# Patient Record
Sex: Female | Born: 1935 | Race: Black or African American | Hispanic: No | Smoking: Former smoker
Health system: Southern US, Community
[De-identification: ages and names within clinical notes are randomized; demographics above are authoritative.]

## PROBLEM LIST (undated history)

## (undated) ENCOUNTER — Emergency Department (HOSPITAL_COMMUNITY): Disposition: A | Discharge status: Home / Self Care | Payer: Medicare Other

## (undated) DIAGNOSIS — M549 Dorsalgia, unspecified: Secondary | ICD-10-CM

## (undated) DIAGNOSIS — N186 End stage renal disease: Secondary | ICD-10-CM

## (undated) DIAGNOSIS — I272 Pulmonary hypertension, unspecified: Secondary | ICD-10-CM

## (undated) DIAGNOSIS — E785 Hyperlipidemia, unspecified: Secondary | ICD-10-CM

## (undated) DIAGNOSIS — K219 Gastro-esophageal reflux disease without esophagitis: Secondary | ICD-10-CM

## (undated) DIAGNOSIS — S7010XA Contusion of unspecified thigh, initial encounter: Secondary | ICD-10-CM

## (undated) DIAGNOSIS — J45909 Unspecified asthma, uncomplicated: Secondary | ICD-10-CM

## (undated) DIAGNOSIS — M109 Gout, unspecified: Secondary | ICD-10-CM

## (undated) DIAGNOSIS — D649 Anemia, unspecified: Secondary | ICD-10-CM

## (undated) DIAGNOSIS — J189 Pneumonia, unspecified organism: Secondary | ICD-10-CM

## (undated) DIAGNOSIS — Z5189 Encounter for other specified aftercare: Secondary | ICD-10-CM

## (undated) DIAGNOSIS — I10 Essential (primary) hypertension: Secondary | ICD-10-CM

## (undated) DIAGNOSIS — Z992 Dependence on renal dialysis: Secondary | ICD-10-CM

## (undated) DIAGNOSIS — M1990 Unspecified osteoarthritis, unspecified site: Secondary | ICD-10-CM

## (undated) DIAGNOSIS — S4291XA Fracture of right shoulder girdle, part unspecified, initial encounter for closed fracture: Secondary | ICD-10-CM

## (undated) DIAGNOSIS — E119 Type 2 diabetes mellitus without complications: Secondary | ICD-10-CM

## (undated) DIAGNOSIS — I96 Gangrene, not elsewhere classified: Secondary | ICD-10-CM

## (undated) DIAGNOSIS — G8929 Other chronic pain: Secondary | ICD-10-CM

## (undated) DIAGNOSIS — I739 Peripheral vascular disease, unspecified: Secondary | ICD-10-CM

## (undated) HISTORY — PX: AV FISTULA REPAIR: SHX563

## (undated) HISTORY — DX: Gangrene, not elsewhere classified: I96

## (undated) HISTORY — PX: CATARACT EXTRACTION, BILATERAL: SHX1313

## (undated) HISTORY — DX: Anemia, unspecified: D64.9

## (undated) HISTORY — DX: Unspecified osteoarthritis, unspecified site: M19.90

## (undated) HISTORY — PX: EYE SURGERY: SHX253

## (undated) HISTORY — PX: ARTERIOVENOUS GRAFT PLACEMENT: SUR1029

## (undated) HISTORY — PX: COLONOSCOPY: SHX174

## (undated) HISTORY — DX: Peripheral vascular disease, unspecified: I73.9

## (undated) HISTORY — DX: Hyperlipidemia, unspecified: E78.5

## (undated) HISTORY — PX: AV FISTULA PLACEMENT: SHX1204

## (undated) HISTORY — DX: Gout, unspecified: M10.9

## (undated) HISTORY — DX: Pulmonary hypertension, unspecified: I27.20

---

## 1959-09-01 DIAGNOSIS — Z5189 Encounter for other specified aftercare: Secondary | ICD-10-CM

## 1959-09-01 DIAGNOSIS — IMO0001 Reserved for inherently not codable concepts without codable children: Secondary | ICD-10-CM

## 1959-09-01 HISTORY — PX: TOTAL ABDOMINAL HYSTERECTOMY: SHX209

## 1959-09-01 HISTORY — PX: APPENDECTOMY: SHX54

## 1959-09-01 HISTORY — DX: Reserved for inherently not codable concepts without codable children: IMO0001

## 1959-09-01 HISTORY — DX: Encounter for other specified aftercare: Z51.89

## 1979-09-01 DIAGNOSIS — M109 Gout, unspecified: Secondary | ICD-10-CM

## 1979-09-01 HISTORY — DX: Gout, unspecified: M10.9

## 2002-01-16 ENCOUNTER — Ambulatory Visit (HOSPITAL_COMMUNITY): Admission: RE | Admit: 2002-01-16 | Discharge: 2002-01-16 | Payer: Self-pay | Admitting: Pulmonary Disease

## 2002-01-16 ENCOUNTER — Encounter: Payer: Self-pay | Admitting: Pulmonary Disease

## 2002-10-14 ENCOUNTER — Encounter: Payer: Self-pay | Admitting: Pulmonary Disease

## 2002-10-14 ENCOUNTER — Ambulatory Visit (HOSPITAL_COMMUNITY): Admission: RE | Admit: 2002-10-14 | Discharge: 2002-10-14 | Payer: Self-pay | Admitting: Pulmonary Disease

## 2002-11-05 ENCOUNTER — Encounter: Payer: Self-pay | Admitting: Emergency Medicine

## 2002-11-05 ENCOUNTER — Emergency Department (HOSPITAL_COMMUNITY): Admission: EM | Admit: 2002-11-05 | Discharge: 2002-11-05 | Payer: Self-pay | Admitting: Emergency Medicine

## 2005-01-03 ENCOUNTER — Ambulatory Visit (HOSPITAL_COMMUNITY): Admission: RE | Admit: 2005-01-03 | Discharge: 2005-01-03 | Payer: Self-pay | Admitting: Pulmonary Disease

## 2005-01-04 ENCOUNTER — Inpatient Hospital Stay (HOSPITAL_COMMUNITY): Admission: EM | Admit: 2005-01-04 | Discharge: 2005-01-11 | Payer: Self-pay | Admitting: *Deleted

## 2005-12-06 ENCOUNTER — Ambulatory Visit (HOSPITAL_COMMUNITY): Admission: RE | Admit: 2005-12-06 | Discharge: 2005-12-06 | Payer: Self-pay | Admitting: Pulmonary Disease

## 2006-02-28 ENCOUNTER — Ambulatory Visit (HOSPITAL_COMMUNITY): Admission: RE | Admit: 2006-02-28 | Discharge: 2006-02-28 | Payer: Self-pay | Admitting: Pulmonary Disease

## 2006-03-20 ENCOUNTER — Inpatient Hospital Stay (HOSPITAL_COMMUNITY): Admission: AD | Admit: 2006-03-20 | Discharge: 2006-03-27 | Payer: Self-pay | Admitting: Internal Medicine

## 2006-03-22 ENCOUNTER — Encounter: Payer: Self-pay | Admitting: Vascular Surgery

## 2006-05-29 ENCOUNTER — Ambulatory Visit: Payer: Self-pay | Admitting: Oncology

## 2006-06-03 ENCOUNTER — Encounter (HOSPITAL_COMMUNITY): Admission: RE | Admit: 2006-06-03 | Discharge: 2006-09-01 | Payer: Self-pay | Admitting: Nephrology

## 2006-06-12 LAB — CBC WITH DIFFERENTIAL/PLATELET
BASO%: 0.3 % (ref 0.0–2.0)
Basophils Absolute: 0 10*3/uL (ref 0.0–0.1)
HCT: 34.2 % — ABNORMAL LOW (ref 34.8–46.6)
HGB: 11.2 g/dL — ABNORMAL LOW (ref 11.6–15.9)
MONO#: 0.6 10*3/uL (ref 0.1–0.9)
NEUT#: 5.6 10*3/uL (ref 1.5–6.5)
NEUT%: 71.2 % (ref 39.6–76.8)
WBC: 7.9 10*3/uL (ref 3.9–10.0)
lymph#: 1.4 10*3/uL (ref 0.9–3.3)

## 2006-06-14 ENCOUNTER — Ambulatory Visit (HOSPITAL_COMMUNITY): Admission: RE | Admit: 2006-06-14 | Discharge: 2006-06-14 | Payer: Self-pay | Admitting: Oncology

## 2006-06-15 LAB — COMPREHENSIVE METABOLIC PANEL
ALT: 14 U/L (ref 0–40)
AST: 14 U/L (ref 0–37)
Albumin: 4.2 g/dL (ref 3.5–5.2)
Alkaline Phosphatase: 85 U/L (ref 39–117)
BUN: 75 mg/dL — ABNORMAL HIGH (ref 6–23)
CO2: 24 mEq/L (ref 19–32)
Calcium: 11.6 mg/dL — ABNORMAL HIGH (ref 8.4–10.5)
Chloride: 98 mEq/L (ref 96–112)
Creatinine, Ser: 8.09 mg/dL — ABNORMAL HIGH (ref 0.40–1.20)
Glucose, Bld: 150 mg/dL — ABNORMAL HIGH (ref 70–99)
Potassium: 4.5 mEq/L (ref 3.5–5.3)
Sodium: 137 mEq/L (ref 135–145)
Total Bilirubin: 0.3 mg/dL (ref 0.3–1.2)
Total Protein: 7.7 g/dL (ref 6.0–8.3)

## 2006-06-15 LAB — VISCOSITY, SERUM: Viscosity, Serum: 1.3 mPa.S (ref 1.1–2.0)

## 2006-06-15 LAB — SPEP & IFE WITH QIG
Albumin ELP: 51.6 % — ABNORMAL LOW (ref 55.8–66.1)
Alpha-1-Globulin: 5.9 % — ABNORMAL HIGH (ref 2.9–4.9)
Alpha-2-Globulin: 11.1 % (ref 7.1–11.8)
Beta 2: 5.2 % (ref 3.2–6.5)
Beta Globulin: 4.5 % — ABNORMAL LOW (ref 4.7–7.2)
Gamma Globulin: 21.7 % — ABNORMAL HIGH (ref 11.1–18.8)
IgA: 235 mg/dL (ref 68–378)
IgG (Immunoglobin G), Serum: 1850 mg/dL — ABNORMAL HIGH (ref 694–1618)
IgM, Serum: 95 mg/dL (ref 60–263)
M-Spike, %: 0.73 g/dL
Total Protein, Serum Electrophoresis: 7.7 g/dL (ref 6.0–8.3)

## 2006-06-15 LAB — BETA 2 MICROGLOBULIN, SERUM: Beta-2 Microglobulin: 13.97 mg/L — ABNORMAL HIGH (ref 1.01–1.73)

## 2006-06-15 LAB — KAPPA/LAMBDA LIGHT CHAINS
Kappa free light chain: 24.2 mg/dL — ABNORMAL HIGH (ref 0.33–1.94)
Kappa:Lambda Ratio: 1.95 — ABNORMAL HIGH (ref 0.26–1.65)
Lambda Free Lght Chn: 12.4 mg/dL — ABNORMAL HIGH (ref 0.57–2.63)

## 2006-06-15 LAB — LACTATE DEHYDROGENASE: LDH: 146 U/L (ref 94–250)

## 2006-06-17 LAB — UIFE/LIGHT CHAINS/TP QN, 24-HR UR
Free Kappa/Lambda Ratio: 5.14 ratio — ABNORMAL HIGH (ref 0.46–4.00)
Free Lambda Lt Chains,Ur: 5.6 mg/dL — ABNORMAL HIGH (ref 0.08–1.01)
Free Lt Chn Excr Rate: 230.4 mg/d
Total Protein, Urine-Ur/day: 973 mg/d — ABNORMAL HIGH (ref 10–140)
Total Protein, Urine: 121.6 mg/dL
Volume, Urine: 800 mL

## 2006-07-10 ENCOUNTER — Ambulatory Visit: Payer: Self-pay | Admitting: Oncology

## 2006-07-29 ENCOUNTER — Ambulatory Visit: Payer: Self-pay | Admitting: Oncology

## 2006-07-29 ENCOUNTER — Ambulatory Visit (HOSPITAL_COMMUNITY): Admission: RE | Admit: 2006-07-29 | Discharge: 2006-07-29 | Payer: Self-pay | Admitting: Oncology

## 2006-07-29 ENCOUNTER — Encounter (INDEPENDENT_AMBULATORY_CARE_PROVIDER_SITE_OTHER): Payer: Self-pay | Admitting: Specialist

## 2006-09-11 ENCOUNTER — Encounter (HOSPITAL_COMMUNITY): Admission: RE | Admit: 2006-09-11 | Discharge: 2006-12-10 | Payer: Self-pay | Admitting: Nephrology

## 2006-11-04 ENCOUNTER — Ambulatory Visit: Payer: Self-pay | Admitting: Oncology

## 2006-11-06 LAB — CBC WITH DIFFERENTIAL/PLATELET
BASO%: 0.2 % (ref 0.0–2.0)
Basophils Absolute: 0 10*3/uL (ref 0.0–0.1)
Eosinophils Absolute: 0.2 10*3/uL (ref 0.0–0.5)
HCT: 37.7 % (ref 34.8–46.6)
HGB: 12.2 g/dL (ref 11.6–15.9)
LYMPH%: 24.2 % (ref 14.0–48.0)
MCHC: 32.4 g/dL (ref 32.0–36.0)
MONO#: 0.5 10*3/uL (ref 0.1–0.9)
NEUT%: 63.6 % (ref 39.6–76.8)
Platelets: 375 10*3/uL (ref 145–400)
WBC: 6.2 10*3/uL (ref 3.9–10.0)

## 2006-11-11 LAB — KAPPA/LAMBDA LIGHT CHAINS: Kappa:Lambda Ratio: 1.83 — ABNORMAL HIGH (ref 0.26–1.65)

## 2006-11-11 LAB — COMPREHENSIVE METABOLIC PANEL
AST: 21 U/L (ref 0–37)
Albumin: 3.8 g/dL (ref 3.5–5.2)
Alkaline Phosphatase: 63 U/L (ref 39–117)
Glucose, Bld: 106 mg/dL — ABNORMAL HIGH (ref 70–99)
Potassium: 5.9 mEq/L — ABNORMAL HIGH (ref 3.5–5.3)
Sodium: 138 mEq/L (ref 135–145)
Total Protein: 7.1 g/dL (ref 6.0–8.3)

## 2006-11-11 LAB — SPEP & IFE WITH QIG
Albumin ELP: 51.8 % — ABNORMAL LOW (ref 55.8–66.1)
Alpha-1-Globulin: 6 % — ABNORMAL HIGH (ref 2.9–4.9)
Alpha-2-Globulin: 10.8 % (ref 7.1–11.8)
Beta 2: 4.4 % (ref 3.2–6.5)
Beta Globulin: 5.2 % (ref 4.7–7.2)
IgA: 188 mg/dL (ref 68–378)

## 2007-01-22 ENCOUNTER — Encounter (HOSPITAL_COMMUNITY): Admission: RE | Admit: 2007-01-22 | Discharge: 2007-04-22 | Payer: Self-pay | Admitting: Pediatric Dentistry

## 2007-04-30 ENCOUNTER — Encounter (HOSPITAL_COMMUNITY): Admission: RE | Admit: 2007-04-30 | Discharge: 2007-07-07 | Payer: Self-pay | Admitting: Nephrology

## 2007-05-23 ENCOUNTER — Ambulatory Visit: Payer: Self-pay | Admitting: Oncology

## 2007-05-30 ENCOUNTER — Ambulatory Visit (HOSPITAL_COMMUNITY): Admission: RE | Admit: 2007-05-30 | Discharge: 2007-05-30 | Payer: Self-pay | Admitting: Vascular Surgery

## 2007-05-30 ENCOUNTER — Ambulatory Visit: Payer: Self-pay | Admitting: Vascular Surgery

## 2007-07-11 ENCOUNTER — Emergency Department (HOSPITAL_COMMUNITY): Admission: EM | Admit: 2007-07-11 | Discharge: 2007-07-11 | Payer: Self-pay | Admitting: Emergency Medicine

## 2007-07-22 ENCOUNTER — Ambulatory Visit (HOSPITAL_COMMUNITY): Admission: RE | Admit: 2007-07-22 | Discharge: 2007-07-22 | Payer: Self-pay | Admitting: Family Medicine

## 2007-08-19 ENCOUNTER — Ambulatory Visit (HOSPITAL_COMMUNITY): Admission: RE | Admit: 2007-08-19 | Discharge: 2007-08-19 | Payer: Self-pay | Admitting: Vascular Surgery

## 2007-08-19 ENCOUNTER — Ambulatory Visit: Payer: Self-pay | Admitting: Vascular Surgery

## 2007-12-17 ENCOUNTER — Ambulatory Visit: Payer: Self-pay

## 2007-12-29 ENCOUNTER — Ambulatory Visit (HOSPITAL_COMMUNITY): Admission: RE | Admit: 2007-12-29 | Discharge: 2007-12-29 | Payer: Self-pay | Admitting: Nephrology

## 2007-12-31 ENCOUNTER — Ambulatory Visit: Payer: Self-pay | Admitting: Surgery

## 2007-12-31 ENCOUNTER — Ambulatory Visit (HOSPITAL_COMMUNITY): Admission: RE | Admit: 2007-12-31 | Discharge: 2007-12-31 | Payer: Self-pay | Admitting: Surgery

## 2008-01-27 ENCOUNTER — Ambulatory Visit: Payer: Self-pay | Admitting: Vascular Surgery

## 2008-02-12 ENCOUNTER — Ambulatory Visit (HOSPITAL_COMMUNITY): Admission: RE | Admit: 2008-02-12 | Discharge: 2008-02-12 | Payer: Self-pay | Admitting: Vascular Surgery

## 2008-02-12 ENCOUNTER — Ambulatory Visit: Payer: Self-pay | Admitting: Vascular Surgery

## 2008-03-16 ENCOUNTER — Ambulatory Visit (HOSPITAL_COMMUNITY): Admission: RE | Admit: 2008-03-16 | Discharge: 2008-03-16 | Payer: Self-pay | Admitting: Vascular Surgery

## 2008-03-16 ENCOUNTER — Ambulatory Visit: Payer: Self-pay | Admitting: Vascular Surgery

## 2008-03-30 ENCOUNTER — Ambulatory Visit: Payer: Self-pay | Admitting: Vascular Surgery

## 2008-04-15 ENCOUNTER — Ambulatory Visit (HOSPITAL_COMMUNITY): Admission: RE | Admit: 2008-04-15 | Discharge: 2008-04-15 | Payer: Self-pay | Admitting: Vascular Surgery

## 2008-04-15 ENCOUNTER — Ambulatory Visit: Payer: Self-pay | Admitting: Vascular Surgery

## 2008-04-22 ENCOUNTER — Encounter: Admission: RE | Admit: 2008-04-22 | Discharge: 2008-04-22 | Payer: Self-pay | Admitting: Nephrology

## 2008-04-27 ENCOUNTER — Ambulatory Visit: Payer: Self-pay | Admitting: Vascular Surgery

## 2008-05-31 ENCOUNTER — Ambulatory Visit (HOSPITAL_COMMUNITY): Admission: RE | Admit: 2008-05-31 | Discharge: 2008-05-31 | Payer: Self-pay | Admitting: Vascular Surgery

## 2008-05-31 ENCOUNTER — Emergency Department (HOSPITAL_COMMUNITY): Admission: EM | Admit: 2008-05-31 | Discharge: 2008-05-31 | Payer: Self-pay | Admitting: Emergency Medicine

## 2008-05-31 ENCOUNTER — Ambulatory Visit: Payer: Self-pay | Admitting: *Deleted

## 2008-06-06 ENCOUNTER — Inpatient Hospital Stay (HOSPITAL_COMMUNITY): Admission: EM | Admit: 2008-06-06 | Discharge: 2008-06-06 | Payer: Self-pay | Admitting: Emergency Medicine

## 2008-06-06 ENCOUNTER — Encounter (INDEPENDENT_AMBULATORY_CARE_PROVIDER_SITE_OTHER): Payer: Self-pay | Admitting: Emergency Medicine

## 2008-06-07 ENCOUNTER — Ambulatory Visit: Payer: Self-pay | Admitting: Surgery

## 2008-06-17 ENCOUNTER — Ambulatory Visit (HOSPITAL_COMMUNITY): Admission: RE | Admit: 2008-06-17 | Discharge: 2008-06-17 | Payer: Self-pay | Admitting: Surgery

## 2008-06-27 ENCOUNTER — Inpatient Hospital Stay (HOSPITAL_COMMUNITY): Admission: EM | Admit: 2008-06-27 | Discharge: 2008-06-29 | Payer: Self-pay | Admitting: Emergency Medicine

## 2008-06-28 ENCOUNTER — Encounter: Payer: Self-pay | Admitting: Internal Medicine

## 2008-07-22 ENCOUNTER — Encounter: Admission: RE | Admit: 2008-07-22 | Discharge: 2008-07-22 | Payer: Self-pay | Admitting: Family Medicine

## 2008-07-29 ENCOUNTER — Encounter: Admission: RE | Admit: 2008-07-29 | Discharge: 2008-07-29 | Payer: Self-pay | Admitting: Family Medicine

## 2008-09-30 ENCOUNTER — Encounter: Admission: RE | Admit: 2008-09-30 | Discharge: 2008-09-30 | Payer: Self-pay | Admitting: Nephrology

## 2008-10-19 ENCOUNTER — Ambulatory Visit (HOSPITAL_COMMUNITY): Admission: RE | Admit: 2008-10-19 | Discharge: 2008-10-19 | Payer: Self-pay | Admitting: Nephrology

## 2008-10-21 ENCOUNTER — Encounter: Admission: RE | Admit: 2008-10-21 | Discharge: 2008-10-21 | Payer: Self-pay | Admitting: Nephrology

## 2008-11-30 ENCOUNTER — Ambulatory Visit (HOSPITAL_COMMUNITY): Admission: RE | Admit: 2008-11-30 | Discharge: 2008-11-30 | Payer: Self-pay | Admitting: Nephrology

## 2008-12-02 ENCOUNTER — Ambulatory Visit: Payer: Self-pay | Admitting: *Deleted

## 2008-12-13 ENCOUNTER — Ambulatory Visit (HOSPITAL_COMMUNITY): Admission: RE | Admit: 2008-12-13 | Discharge: 2008-12-13 | Payer: Self-pay | Admitting: *Deleted

## 2008-12-13 ENCOUNTER — Ambulatory Visit: Payer: Self-pay | Admitting: *Deleted

## 2009-01-06 ENCOUNTER — Ambulatory Visit: Payer: Self-pay | Admitting: *Deleted

## 2009-01-13 ENCOUNTER — Ambulatory Visit: Payer: Self-pay | Admitting: Vascular Surgery

## 2009-01-14 ENCOUNTER — Inpatient Hospital Stay (HOSPITAL_COMMUNITY): Admission: RE | Admit: 2009-01-14 | Discharge: 2009-01-15 | Payer: Self-pay | Admitting: Vascular Surgery

## 2009-02-18 ENCOUNTER — Ambulatory Visit (HOSPITAL_COMMUNITY): Admission: RE | Admit: 2009-02-18 | Discharge: 2009-02-18 | Payer: Self-pay | Admitting: Vascular Surgery

## 2009-02-18 ENCOUNTER — Ambulatory Visit: Payer: Self-pay | Admitting: Vascular Surgery

## 2009-03-15 ENCOUNTER — Ambulatory Visit (HOSPITAL_COMMUNITY): Admission: RE | Admit: 2009-03-15 | Discharge: 2009-03-15 | Payer: Self-pay | Admitting: Surgery

## 2009-03-17 ENCOUNTER — Ambulatory Visit (HOSPITAL_COMMUNITY): Admission: RE | Admit: 2009-03-17 | Discharge: 2009-03-17 | Payer: Self-pay | Admitting: Surgery

## 2009-03-18 ENCOUNTER — Ambulatory Visit (HOSPITAL_COMMUNITY): Admission: RE | Admit: 2009-03-18 | Discharge: 2009-03-18 | Payer: Self-pay | Admitting: Vascular Surgery

## 2009-03-18 ENCOUNTER — Ambulatory Visit: Payer: Self-pay | Admitting: Vascular Surgery

## 2009-03-21 ENCOUNTER — Emergency Department (HOSPITAL_COMMUNITY): Admission: EM | Admit: 2009-03-21 | Discharge: 2009-03-21 | Payer: Self-pay | Admitting: Emergency Medicine

## 2009-03-22 ENCOUNTER — Ambulatory Visit: Payer: Self-pay | Admitting: Vascular Surgery

## 2009-04-05 ENCOUNTER — Ambulatory Visit (HOSPITAL_COMMUNITY): Admission: RE | Admit: 2009-04-05 | Discharge: 2009-04-06 | Payer: Self-pay | Admitting: Vascular Surgery

## 2009-05-12 ENCOUNTER — Ambulatory Visit: Payer: Self-pay | Admitting: Vascular Surgery

## 2009-05-12 ENCOUNTER — Ambulatory Visit (HOSPITAL_COMMUNITY): Admission: RE | Admit: 2009-05-12 | Discharge: 2009-05-12 | Payer: Self-pay | Admitting: Vascular Surgery

## 2009-07-18 ENCOUNTER — Emergency Department (HOSPITAL_COMMUNITY): Admission: EM | Admit: 2009-07-18 | Discharge: 2009-07-18 | Payer: Self-pay | Admitting: Emergency Medicine

## 2009-11-23 ENCOUNTER — Encounter: Admission: RE | Admit: 2009-11-23 | Discharge: 2009-11-23 | Payer: Self-pay | Admitting: Nephrology

## 2010-01-09 ENCOUNTER — Encounter: Admission: RE | Admit: 2010-01-09 | Discharge: 2010-01-09 | Payer: Self-pay | Admitting: Family Medicine

## 2010-02-13 DIAGNOSIS — I498 Other specified cardiac arrhythmias: Secondary | ICD-10-CM

## 2010-02-13 DIAGNOSIS — I10 Essential (primary) hypertension: Secondary | ICD-10-CM

## 2010-02-13 DIAGNOSIS — N186 End stage renal disease: Secondary | ICD-10-CM

## 2010-02-13 DIAGNOSIS — M109 Gout, unspecified: Secondary | ICD-10-CM

## 2010-02-13 DIAGNOSIS — E1122 Type 2 diabetes mellitus with diabetic chronic kidney disease: Secondary | ICD-10-CM

## 2010-02-13 DIAGNOSIS — J4489 Other specified chronic obstructive pulmonary disease: Secondary | ICD-10-CM | POA: Insufficient documentation

## 2010-02-13 DIAGNOSIS — E875 Hyperkalemia: Secondary | ICD-10-CM | POA: Insufficient documentation

## 2010-02-13 DIAGNOSIS — J449 Chronic obstructive pulmonary disease, unspecified: Secondary | ICD-10-CM | POA: Insufficient documentation

## 2010-02-13 DIAGNOSIS — R262 Difficulty in walking, not elsewhere classified: Secondary | ICD-10-CM | POA: Insufficient documentation

## 2010-02-14 ENCOUNTER — Ambulatory Visit: Payer: Self-pay | Admitting: Cardiovascular Disease

## 2010-02-14 DIAGNOSIS — R072 Precordial pain: Secondary | ICD-10-CM

## 2010-02-14 DIAGNOSIS — R002 Palpitations: Secondary | ICD-10-CM | POA: Insufficient documentation

## 2010-02-22 ENCOUNTER — Telehealth (INDEPENDENT_AMBULATORY_CARE_PROVIDER_SITE_OTHER): Payer: Self-pay | Admitting: *Deleted

## 2010-03-07 ENCOUNTER — Ambulatory Visit: Payer: Self-pay

## 2010-03-07 ENCOUNTER — Encounter: Payer: Self-pay | Admitting: Cardiovascular Disease

## 2010-03-07 ENCOUNTER — Ambulatory Visit (HOSPITAL_COMMUNITY): Admission: RE | Admit: 2010-03-07 | Discharge: 2010-03-07 | Payer: Self-pay | Admitting: Cardiovascular Disease

## 2010-03-07 ENCOUNTER — Ambulatory Visit: Payer: Self-pay | Admitting: Cardiovascular Disease

## 2010-03-16 ENCOUNTER — Encounter: Admission: RE | Admit: 2010-03-16 | Discharge: 2010-03-16 | Payer: Self-pay | Admitting: Nephrology

## 2010-03-28 ENCOUNTER — Ambulatory Visit: Payer: Self-pay | Admitting: Cardiovascular Disease

## 2010-03-28 DIAGNOSIS — I059 Rheumatic mitral valve disease, unspecified: Secondary | ICD-10-CM | POA: Insufficient documentation

## 2010-06-18 ENCOUNTER — Emergency Department (HOSPITAL_COMMUNITY): Admission: EM | Admit: 2010-06-18 | Discharge: 2010-06-18 | Payer: Self-pay | Admitting: Family Medicine

## 2010-06-19 ENCOUNTER — Inpatient Hospital Stay (HOSPITAL_COMMUNITY): Admission: EM | Admit: 2010-06-19 | Discharge: 2010-06-21 | Payer: Self-pay | Admitting: Emergency Medicine

## 2010-07-18 ENCOUNTER — Ambulatory Visit: Payer: Self-pay | Admitting: Vascular Surgery

## 2010-07-20 ENCOUNTER — Ambulatory Visit (HOSPITAL_COMMUNITY): Admission: RE | Admit: 2010-07-20 | Discharge: 2010-07-20 | Payer: Self-pay | Admitting: Nephrology

## 2010-08-03 ENCOUNTER — Inpatient Hospital Stay (HOSPITAL_COMMUNITY): Admission: RE | Admit: 2010-08-03 | Discharge: 2010-08-04 | Payer: Self-pay | Admitting: Surgery

## 2010-08-03 ENCOUNTER — Ambulatory Visit: Payer: Self-pay | Admitting: Surgery

## 2010-08-22 ENCOUNTER — Ambulatory Visit: Payer: Self-pay | Admitting: Vascular Surgery

## 2010-12-04 ENCOUNTER — Ambulatory Visit (HOSPITAL_COMMUNITY)
Admission: RE | Admit: 2010-12-04 | Discharge: 2010-12-04 | Payer: Self-pay | Source: Home / Self Care | Admitting: Nephrology

## 2010-12-19 ENCOUNTER — Ambulatory Visit: Payer: Self-pay | Admitting: Surgery

## 2010-12-31 DIAGNOSIS — S4291XA Fracture of right shoulder girdle, part unspecified, initial encounter for closed fracture: Secondary | ICD-10-CM

## 2010-12-31 HISTORY — DX: Fracture of right shoulder girdle, part unspecified, initial encounter for closed fracture: S42.91XA

## 2011-01-21 ENCOUNTER — Encounter: Payer: Self-pay | Admitting: Nephrology
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
## 2011-02-01 NOTE — Progress Notes (Signed)
  Phone Note From Other Clinic   Caller: [facility] Details for Reason: pt.Information Initial call taken by: KM    Faxed all Cardiac over to [facility] [place]  February 22, 2010 3:44 PM
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
## 2011-03-07 ENCOUNTER — Other Ambulatory Visit (HOSPITAL_COMMUNITY): Payer: Self-pay | Admitting: Podiatry

## 2011-03-07 DIAGNOSIS — M79672 Pain in left foot: Secondary | ICD-10-CM

## 2011-03-16 ENCOUNTER — Encounter (HOSPITAL_COMMUNITY): Payer: Self-pay

## 2011-03-16 ENCOUNTER — Other Ambulatory Visit (HOSPITAL_COMMUNITY): Payer: Self-pay

## 2011-03-16 LAB — POCT I-STAT, CHEM 8
BUN: 24 mg/dL — ABNORMAL HIGH (ref 6–23)
Calcium, Ion: 0.99 mmol/L — ABNORMAL LOW (ref 1.12–1.32)
Chloride: 101 mEq/L (ref 96–112)
Glucose, Bld: 125 mg/dL — ABNORMAL HIGH (ref 70–99)
HCT: 38 % (ref 36.0–46.0)

## 2011-03-16 LAB — CBC
HCT: 24.2 % — ABNORMAL LOW (ref 36.0–46.0)
Hemoglobin: 7.8 g/dL — ABNORMAL LOW (ref 12.0–15.0)
MCH: 32.8 pg (ref 26.0–34.0)
MCHC: 32.2 g/dL (ref 30.0–36.0)

## 2011-03-16 LAB — RENAL FUNCTION PANEL
CO2: 29 mEq/L (ref 19–32)
Calcium: 7.9 mg/dL — ABNORMAL LOW (ref 8.4–10.5)
Creatinine, Ser: 9.39 mg/dL — ABNORMAL HIGH (ref 0.4–1.2)
Glucose, Bld: 131 mg/dL — ABNORMAL HIGH (ref 70–99)

## 2011-03-16 LAB — PROTIME-INR: Prothrombin Time: 13.4 seconds (ref 11.6–15.2)

## 2011-03-18 LAB — RENAL FUNCTION PANEL
Albumin: 2.9 g/dL — ABNORMAL LOW (ref 3.5–5.2)
BUN: 22 mg/dL (ref 6–23)
Creatinine, Ser: 9.82 mg/dL — ABNORMAL HIGH (ref 0.4–1.2)
Glucose, Bld: 123 mg/dL — ABNORMAL HIGH (ref 70–99)
Phosphorus: 2 mg/dL — ABNORMAL LOW (ref 2.3–4.6)

## 2011-03-18 LAB — COMPREHENSIVE METABOLIC PANEL
ALT: 11 U/L (ref 0–35)
AST: 19 U/L (ref 0–37)
Albumin: 3.2 g/dL — ABNORMAL LOW (ref 3.5–5.2)
Alkaline Phosphatase: 58 U/L (ref 39–117)
GFR calc Af Amer: 6 mL/min — ABNORMAL LOW (ref >60)
Potassium: 3.5 mEq/L (ref 3.5–5.1)
Sodium: 135 mEq/L (ref 135–145)
Total Protein: 7.2 g/dL (ref 6.0–8.3)

## 2011-03-18 LAB — GLUCOSE, CAPILLARY
Glucose-Capillary: 103 mg/dL — ABNORMAL HIGH (ref 70–99)
Glucose-Capillary: 158 mg/dL — ABNORMAL HIGH (ref 70–99)
Glucose-Capillary: 185 mg/dL — ABNORMAL HIGH (ref 70–99)
Glucose-Capillary: 96 mg/dL (ref 70–99)
Glucose-Capillary: 97 mg/dL (ref 70–99)
Glucose-Capillary: 98 mg/dL (ref 70–99)

## 2011-03-18 LAB — IRON AND TIBC
Iron: 47 ug/dL (ref 42–135)
TIBC: 154 ug/dL — ABNORMAL LOW (ref 250–470)
UIBC: 107 ug/dL

## 2011-03-18 LAB — BASIC METABOLIC PANEL
BUN: 28 mg/dL — ABNORMAL HIGH (ref 6–23)
BUN: 28 mg/dL — ABNORMAL HIGH (ref 6–23)
CO2: 30 mEq/L (ref 19–32)
CO2: 31 mEq/L (ref 19–32)
Calcium: 8.5 mg/dL (ref 8.4–10.5)
Chloride: 93 mEq/L — ABNORMAL LOW (ref 96–112)
Chloride: 94 mEq/L — ABNORMAL LOW (ref 96–112)
Chloride: 96 mEq/L (ref 96–112)
Creatinine, Ser: 10.41 mg/dL — ABNORMAL HIGH (ref 0.4–1.2)
Creatinine, Ser: 10.43 mg/dL — ABNORMAL HIGH (ref 0.4–1.2)
Creatinine, Ser: 11.31 mg/dL — ABNORMAL HIGH (ref 0.4–1.2)
GFR calc Af Amer: 4 mL/min — ABNORMAL LOW (ref >60)
GFR calc non Af Amer: 3 mL/min — ABNORMAL LOW (ref >60)

## 2011-03-18 LAB — CBC
HCT: 28.1 % — ABNORMAL LOW (ref 36.0–46.0)
Hemoglobin: 11.4 g/dL — ABNORMAL LOW (ref 12.0–15.0)
MCHC: 33.6 g/dL (ref 30.0–36.0)
MCHC: 33.8 g/dL (ref 30.0–36.0)
MCV: 97.6 fL (ref 78.0–100.0)
MCV: 98.2 fL (ref 78.0–100.0)
Platelets: 198 10*3/uL (ref 150–400)
Platelets: 217 10*3/uL (ref 150–400)
Platelets: 228 10*3/uL (ref 150–400)
RBC: 2.97 MIL/uL — ABNORMAL LOW (ref 3.87–5.11)
RDW: 13.9 % (ref 11.5–15.5)
RDW: 14 % (ref 11.5–15.5)

## 2011-03-18 LAB — CARDIAC PANEL(CRET KIN+CKTOT+MB+TROPI)
Total CK: 410 U/L — ABNORMAL HIGH (ref 7–177)
Troponin I: 0.04 ng/mL (ref 0.00–0.06)

## 2011-03-18 LAB — HEMOGLOBIN A1C
Hgb A1c MFr Bld: 7.4 % — ABNORMAL HIGH (ref <5.7)
Mean Plasma Glucose: 166 mg/dL — ABNORMAL HIGH (ref <117)

## 2011-03-18 LAB — DIFFERENTIAL
Basophils Relative: 0 % (ref 0–1)
Eosinophils Absolute: 0.1 10*3/uL (ref 0.0–0.7)
Eosinophils Relative: 1 % (ref 0–5)
Monocytes Relative: 8 % (ref 3–12)
Neutrophils Relative %: 73 % (ref 43–77)

## 2011-03-18 LAB — VITAMIN B12: Vitamin B-12: 944 pg/mL — ABNORMAL HIGH (ref 211–911)

## 2011-03-18 LAB — FERRITIN: Ferritin: 804 ng/mL — ABNORMAL HIGH (ref 10–291)

## 2011-03-18 LAB — RETICULOCYTES: Retic Count, Absolute: 46.6 10*3/uL (ref 19.0–186.0)

## 2011-03-18 LAB — POCT CARDIAC MARKERS: CKMB, poc: 2.1 ng/mL (ref 1.0–8.0)

## 2011-03-18 LAB — LACTIC ACID, PLASMA: Lactic Acid, Venous: 1.9 mmol/L (ref 0.5–2.2)

## 2011-03-18 LAB — TSH: TSH: 2.37 u[IU]/mL (ref 0.350–4.500)

## 2011-03-22 ENCOUNTER — Ambulatory Visit (HOSPITAL_COMMUNITY)
Admission: RE | Admit: 2011-03-22 | Discharge: 2011-03-22 | Disposition: A | Discharge status: Home / Self Care | Payer: Medicare Other | Source: Ambulatory Visit | Attending: Podiatry | Admitting: Podiatry

## 2011-03-22 ENCOUNTER — Encounter (HOSPITAL_COMMUNITY): Payer: Self-pay

## 2011-03-22 ENCOUNTER — Encounter (HOSPITAL_COMMUNITY)
Admission: RE | Admit: 2011-03-22 | Discharge: 2011-03-22 | Disposition: A | Discharge status: Home / Self Care | Payer: Medicare Other | Source: Ambulatory Visit | Attending: Podiatry | Admitting: Podiatry

## 2011-03-22 DIAGNOSIS — M79672 Pain in left foot: Secondary | ICD-10-CM

## 2011-03-22 DIAGNOSIS — X58XXXS Exposure to other specified factors, sequela: Secondary | ICD-10-CM | POA: Insufficient documentation

## 2011-03-22 DIAGNOSIS — R937 Abnormal findings on diagnostic imaging of other parts of musculoskeletal system: Secondary | ICD-10-CM | POA: Insufficient documentation

## 2011-03-22 DIAGNOSIS — M79609 Pain in unspecified limb: Secondary | ICD-10-CM | POA: Insufficient documentation

## 2011-03-22 DIAGNOSIS — IMO0001 Reserved for inherently not codable concepts without codable children: Secondary | ICD-10-CM | POA: Insufficient documentation

## 2011-03-22 MED ORDER — TECHNETIUM TC 99M MEDRONATE IV KIT
23.7000 | PACK | Freq: Once | INTRAVENOUS | Status: AC | PRN
  Administered 2011-03-22: 23.7 via INTRAVENOUS

## 2011-03-22 MED ORDER — FLUDEOXYGLUCOSE F - 18 (FDG) INJECTION: 23.7000 | Freq: Once | INTRAVENOUS | Status: DC | PRN

## 2011-03-23 ENCOUNTER — Encounter: Payer: Self-pay | Admitting: *Deleted

## 2011-04-05 ENCOUNTER — Ambulatory Visit (INDEPENDENT_AMBULATORY_CARE_PROVIDER_SITE_OTHER): Payer: Medicare Other | Admitting: Cardiovascular Disease

## 2011-04-05 ENCOUNTER — Encounter: Payer: Self-pay | Admitting: Cardiovascular Disease

## 2011-04-05 VITALS — BP 174/64 | HR 70 | Ht <= 58 in | Wt 157.0 lb

## 2011-04-05 DIAGNOSIS — I059 Rheumatic mitral valve disease, unspecified: Secondary | ICD-10-CM

## 2011-04-05 DIAGNOSIS — I10 Essential (primary) hypertension: Secondary | ICD-10-CM

## 2011-04-05 DIAGNOSIS — M79609 Pain in unspecified limb: Secondary | ICD-10-CM

## 2011-04-05 DIAGNOSIS — M79673 Pain in unspecified foot: Secondary | ICD-10-CM | POA: Insufficient documentation

## 2011-04-05 NOTE — Assessment & Plan Note (Signed)
Pulses are difficult to palpate. She does not have classic claudication and her left foot only began hurting after she twisted her ankle. I have offered non-invasive imaging with ABI but she does not wish to do this at this time. She is scheduled to see the [facility] doctors soon. She has been followed in the [facility] over the last month.
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
## 2011-04-05 NOTE — Assessment & Plan Note (Signed)
Mild valvular heart disease by echo in 2011. Will repeat echo in one year.

## 2011-04-05 NOTE — Assessment & Plan Note (Signed)
BP elevated She is followed by Dr. Arty Baumgartner of Nephrology for dialysis. BP meds per Nephrology.

## 2011-04-08 LAB — POCT I-STAT, CHEM 8
BUN: 85 mg/dL — ABNORMAL HIGH (ref 6–23)
Calcium, Ion: 0.99 mmol/L — ABNORMAL LOW (ref 1.12–1.32)
Chloride: 106 meq/L (ref 96–112)
Creatinine, Ser: 11 mg/dL — ABNORMAL HIGH (ref 0.4–1.2)
Glucose, Bld: 132 mg/dL — ABNORMAL HIGH (ref 70–99)
HCT: 47 % — ABNORMAL HIGH (ref 36.0–46.0)
Hemoglobin: 16 g/dL — ABNORMAL HIGH (ref 12.0–15.0)
Potassium: 8.6 mEq/L (ref 3.5–5.1)
Sodium: 128 mEq/L — ABNORMAL LOW (ref 135–145)
TCO2: 20 mmol/L (ref 0–100)

## 2011-04-08 LAB — DIFFERENTIAL
Basophils Absolute: 0 10*3/uL (ref 0.0–0.1)
Basophils Relative: 0 % (ref 0–1)
Eosinophils Absolute: 0.1 10*3/uL (ref 0.0–0.7)
Lymphs Abs: 2.6 10*3/uL (ref 0.7–4.0)
Neutrophils Relative %: 64 % (ref 43–77)

## 2011-04-08 LAB — POTASSIUM: Potassium: >7.5 mEq/L (ref 3.5–5.1)

## 2011-04-08 LAB — CBC
MCV: 98.6 fL (ref 78.0–100.0)
Platelets: 181 10*3/uL (ref 150–400)
RDW: 14.9 % (ref 11.5–15.5)
WBC: 9.3 10*3/uL (ref 4.0–10.5)

## 2011-04-11 LAB — GLUCOSE, CAPILLARY
Glucose-Capillary: 118 mg/dL — ABNORMAL HIGH (ref 70–99)
Glucose-Capillary: 123 mg/dL — ABNORMAL HIGH (ref 70–99)
Glucose-Capillary: 127 mg/dL — ABNORMAL HIGH (ref 70–99)
Glucose-Capillary: 166 mg/dL — ABNORMAL HIGH (ref 70–99)

## 2011-04-11 LAB — CBC
HCT: 33.4 % — ABNORMAL LOW (ref 36.0–46.0)
MCHC: 33.3 g/dL (ref 30.0–36.0)
MCV: 97.6 fL (ref 78.0–100.0)
Platelets: 309 10*3/uL (ref 150–400)

## 2011-04-11 LAB — POCT I-STAT 4, (NA,K, GLUC, HGB,HCT)
HCT: 36 % (ref 36.0–46.0)
Hemoglobin: 12.2 g/dL (ref 12.0–15.0)

## 2011-04-11 LAB — RENAL FUNCTION PANEL
Albumin: 3 g/dL — ABNORMAL LOW (ref 3.5–5.2)
BUN: 56 mg/dL — ABNORMAL HIGH (ref 6–23)
CO2: 22 mEq/L (ref 19–32)
Calcium: 9.4 mg/dL (ref 8.4–10.5)
Creatinine, Ser: 10.76 mg/dL — ABNORMAL HIGH (ref 0.4–1.2)
GFR calc Af Amer: 4 mL/min — ABNORMAL LOW (ref >60)
GFR calc non Af Amer: 4 mL/min — ABNORMAL LOW (ref >60)

## 2011-04-12 ENCOUNTER — Encounter: Payer: Self-pay | Admitting: Cardiovascular Disease

## 2011-04-12 LAB — POCT I-STAT, CHEM 8
BUN: 53 mg/dL — ABNORMAL HIGH (ref 6–23)
Calcium, Ion: 1.14 mmol/L (ref 1.12–1.32)
Chloride: 100 mEq/L (ref 96–112)
HCT: 48 % — ABNORMAL HIGH (ref 36.0–46.0)
Potassium: 5.2 mEq/L — ABNORMAL HIGH (ref 3.5–5.1)

## 2011-04-12 LAB — CBC
HCT: 32.8 % — ABNORMAL LOW (ref 36.0–46.0)
MCHC: 33.7 g/dL (ref 30.0–36.0)
MCV: 96.9 fL (ref 78.0–100.0)
Platelets: 128 10*3/uL — ABNORMAL LOW (ref 150–400)
RDW: 15.4 % (ref 11.5–15.5)
WBC: 5.8 10*3/uL (ref 4.0–10.5)

## 2011-04-12 LAB — DIFFERENTIAL
Basophils Absolute: 0 10*3/uL (ref 0.0–0.1)
Basophils Relative: 0 % (ref 0–1)
Eosinophils Absolute: 0.2 10*3/uL (ref 0.0–0.7)
Eosinophils Relative: 3 % (ref 0–5)
Neutrophils Relative %: 62 % (ref 43–77)

## 2011-04-12 LAB — POCT I-STAT 4, (NA,K, GLUC, HGB,HCT)
Glucose, Bld: 121 mg/dL — ABNORMAL HIGH (ref 70–99)
HCT: 46 % (ref 36.0–46.0)
Hemoglobin: 15.6 g/dL — ABNORMAL HIGH (ref 12.0–15.0)
Sodium: 133 mEq/L — ABNORMAL LOW (ref 135–145)

## 2011-04-12 LAB — PROTIME-INR
INR: 1 (ref 0.00–1.49)
Prothrombin Time: 13.2 seconds (ref 11.6–15.2)

## 2011-04-16 LAB — CBC
HCT: 35.2 % — ABNORMAL LOW (ref 36.0–46.0)
Hemoglobin: 11.5 g/dL — ABNORMAL LOW (ref 12.0–15.0)
MCHC: 32.7 g/dL (ref 30.0–36.0)
MCV: 102.9 fL — ABNORMAL HIGH (ref 78.0–100.0)
RBC: 3.42 MIL/uL — ABNORMAL LOW (ref 3.87–5.11)
RDW: 16.5 % — ABNORMAL HIGH (ref 11.5–15.5)

## 2011-04-16 LAB — RENAL FUNCTION PANEL
BUN: 35 mg/dL — ABNORMAL HIGH (ref 6–23)
CO2: 22 mEq/L (ref 19–32)
Calcium: 9.2 mg/dL (ref 8.4–10.5)
Creatinine, Ser: 9.28 mg/dL — ABNORMAL HIGH (ref 0.4–1.2)
Glucose, Bld: 82 mg/dL (ref 70–99)
Phosphorus: 5.9 mg/dL — ABNORMAL HIGH (ref 2.3–4.6)
Sodium: 135 mEq/L (ref 135–145)

## 2011-04-16 LAB — GLUCOSE, CAPILLARY
Glucose-Capillary: 137 mg/dL — ABNORMAL HIGH (ref 70–99)
Glucose-Capillary: 139 mg/dL — ABNORMAL HIGH (ref 70–99)
Glucose-Capillary: 89 mg/dL (ref 70–99)
Glucose-Capillary: 140 mg/dL — ABNORMAL HIGH (ref 70–99)
Glucose-Capillary: 175 mg/dL — ABNORMAL HIGH (ref 70–99)
Glucose-Capillary: 74 mg/dL (ref 70–99)

## 2011-04-16 LAB — POCT I-STAT 4, (NA,K, GLUC, HGB,HCT)
HCT: 45 % (ref 36.0–46.0)
Hemoglobin: 15.3 g/dL — ABNORMAL HIGH (ref 12.0–15.0)
Sodium: 134 mEq/L — ABNORMAL LOW (ref 135–145)

## 2011-04-17 LAB — POCT I-STAT 4, (NA,K, GLUC, HGB,HCT)
Glucose, Bld: 174 mg/dL — ABNORMAL HIGH (ref 70–99)
HCT: 51 % — ABNORMAL HIGH (ref 36.0–46.0)
Hemoglobin: 17.3 g/dL — ABNORMAL HIGH (ref 12.0–15.0)

## 2011-04-17 LAB — GLUCOSE, CAPILLARY
Glucose-Capillary: 108 mg/dL — ABNORMAL HIGH (ref 70–99)
Glucose-Capillary: 120 mg/dL — ABNORMAL HIGH (ref 70–99)
Glucose-Capillary: 54 mg/dL — ABNORMAL LOW (ref 70–99)
Glucose-Capillary: 69 mg/dL — ABNORMAL LOW (ref 70–99)

## 2011-05-15 NOTE — Op Note (Signed)
NAMECHELSA, [name]                ACCOUNT NO.:  [id number]   MEDICAL RECORD NO.:  [id number]          PATIENT TYPE:  AMB   LOCATION:  SDS                          FACILITY:  [facility]   PHYSICIAN:  Rosetta Posner, M.D.    DATE OF BIRTH:  1934-11-27   DATE OF PROCEDURE:  05/30/2007  DATE OF DISCHARGE:                               OPERATIVE REPORT   PREOPERATIVE DIAGNOSIS:  End-stage renal disease with poorly functioning  left upper arm AV fistula.   POSTOPERATIVE DIAGNOSIS:  End-stage renal disease with poorly  functioning left upper arm AV fistula.   PROCEDURE:  Placement of new right IJ Diatek catheter with ultrasound  visualization.   SURGEON:  Rosetta Posner, M.D.   ASSISTANT:  Nurse   ANESTHESIA:  MAC.   COMPLICATIONS:  None.   DISPOSITION:  To recovery room stable.   PROCEDURE IN DETAIL:  The patient was taken to the operating room,  placed in supine position where the left and right neck were imaged with  ultrasound revealing patent jugular veins bilaterally.  The patient was  placed in Trendelenburg position.  Using local anesthesia and a single-  wall puncture, the internal jugular vein was accessed with a finder  needle.  Next, using the Seldinger technique, the guidewire was passed  down to the level of the right atrium.  This was confirmed with  fluoroscopy.  Dilator and peel-away sheath were passed over the  guidewire, and the dilator and guidewire were removed.  The catheter was  passed through the peel-away sheath which was then removed.  The  catheter tips were positioned in the distal right atrium.  The catheter  was brought through a separate subcutaneous tunnel through a separate  stab incision.  Two lumen ports were attached and both lumens flushed  and aspirated easily and were locked with 1000 mL heparin.  The catheter  was secured to skin with a 3-0 nylon stitch, and the entry site was  closed with a 4-0 subcuticular Vicryl stitch.  Sterile dressing was  applied.  The patient was taken to the recovery in stable condition.      Rosetta Posner, M.D.  Electronically Signed     TFE/MEDQ  D:  05/30/2007  T:  05/31/2007  Job:  [id number]

## 2011-05-15 NOTE — Assessment & Plan Note (Signed)
OFFICE VISIT   [name], [name]  DOB:  April 06, 1934                                       01/06/2009  [id number]   The patient most recently had a right internal jugular Diatek catheter  placed on 12/13/2008.  She has an occlusion of her left upper arm AV  graft.  Prior shuntogram revealed an occlusion of the left innominate  vein.  Therefore, thrombectomy and revision of the left upper arm graft  was not carried out.   She will require permanent access and has been scheduled to undergo  right arm AV graft placement 01/11/2009 at [facility].   Dorothea Glassman, M.D.  Electronically Signed   PGH/MEDQ  D:  01/06/2009  T:  01/07/2009  Job:  [id number]

## 2011-05-15 NOTE — Assessment & Plan Note (Signed)
OFFICE VISIT   [name], [name]  DOB:  18-Jan-1934                                       01/27/2008  [id number]   The patient currently has a left upper arm AV fistula, which has been  thrombosed for several months and she is being dialyzed through a Diatek  catheter in the left internal jugular vein.  She is right-handed, and  her only previous access has been the left upper arm fistula.   EXAMINATION:  She has a palpable brachial and radial pulse in the left  arm, but no forearm vein visible.  The right side does not appear to  have a good cephalic vein on physical exam, although it does have a good  brachial and radial pulse.  Vein mapping the right upper extremity  revealed the diameter of the vein (cephalic) to measure between 0.21 and  0.25 cm.  It may be adequate for a fistula, but not definite.   I discussed these options with her and we decided to insert a left  forearm graft.  Hopefully, we will be able to find a vein large enough  in the antecubital area to receive this graft.  We scheduled this for  Thursday, February 5 at [facility] as an outpatient.   Nelda Severe Kellie Simmering, M.D.  Electronically Signed   JDL/MEDQ  D:  01/27/2008  T:  01/28/2008  Job:  [id number]

## 2011-05-15 NOTE — Assessment & Plan Note (Signed)
OFFICE VISIT   [name], [name]  DOB:  [birth year]                                       07/18/2010  [id number]   The patient is a 77 year old female with end-stage renal disease on  hemodialysis Monday, Wednesday, Friday.  She has a left thigh graft in  place which was inserted by Dr. Donnetta Hutching in April of 2010 and has worked  well.  She has never had a graft on the right side.  Over the last 3  months she has developed numbness in the distal aspect of the left foot  with a weak sensation.  She does not ambulate long distances but states  that this does not get worse with ambulation.  She has no history of  nonhealing ulcers or infection in the left foot.  She had a vascular  study performed at [facility] on June 14 which I  have reviewed.  This suggests that she might have a stenosis in her left  iliac system proximal to the graft and/or at the proximal anastomosis  and there is concern about a steal syndrome causing the numbness in the  left foot.  She states the numbness is new although she does have  diabetes.   CHRONIC MEDICAL PROBLEMS:  1. Chronic renal insufficiency with hemodialysis Monday, Wednesday,      Friday.  2. Diabetes mellitus.  3. Hypertension.   FAMILY HISTORY:  Negative for coronary artery disease, diabetes and  stroke.   REVIEW OF SYSTEMS:  Denies any chest pain, dyspnea on exertion.  Does  not ambulate long distances.  Does have arthritis, joint pain, muscle  pain.  No rest pain or nonhealing ulcers in the left leg.   PHYSICAL EXAMINATION:  Vital signs:  Blood pressure 177/82, heart rate  79, respirations 14.  General:  She is a chronically ill-appearing  female in no apparent distress, alert and oriented times 3.  HEENT:  Exam is unremarkable.  Chest:  Clear to auscultation.  Cardiovascular:  Regular rhythm.  No murmurs.  Abdomen:  Obese.  No palpable masses.  She  has a 3+ femoral pulse on the right.   The left leg has a 2+ femoral  pulse with a good pulse and thrill in the AV graft in the thigh.  There  is no evidence of severe ischemia in either lower extremity.  Both feet  are warm.   Lower extremity arterial Dopplers in our office revealed an ABI of 0.45  on the left and with compression of the graft there was no change.   I am not certain that there is a steal syndrome and I am not certain  that the numbness is due to the vascular status.  It may well be  diabetic neuropathy.  It does appear however that she may have  significant stenosis in the iliac system above this.  We will schedule  her for an angiogram via the right common femoral approach to see if she  has an iliac lesion amenable to angioplasty and stenting and/or  anastomotic stenosis.  I have discussed with the family that this may  not change the numbness in her leg.  We will evaluate this.  She is  scheduled for an angiogram to be done on Thursday August 4 by Dr.  Trula Slade with possible angioplasty and stenting.  [name], M.D.  Electronically Signed   JDL/MEDQ  D:  07/18/2010  T:  07/19/2010  Job:  [id number]   cc:   Donato Heinz, M.D.

## 2011-05-15 NOTE — Procedures (Signed)
CEPHALIC VEIN MAPPING   INDICATION:  Cephalic vein map prior to placement of right arm dialysis  access.   HISTORY:  The patient has a thrombosed left arm fistula and currently dialyzes via  a left subclavian catheter exam.   EXAM:  Right cephalic vein is compressible.   Diameter measurements range from 0.21 to 0.26 cm.   Left cephalic vein not evaluated.   See attached worksheet for all measurements.   IMPRESSION:  Patent right cephalic vein which is of acceptable diameter  for use as a dialysis access site.   ___________________________________________  Nelda Severe. Kellie Simmering, M.D.   MC/MEDQ  D:  01/27/2008  T:  01/28/2008  Job:  [id number]

## 2011-05-15 NOTE — Assessment & Plan Note (Signed)
OFFICE VISIT   [name], [name]  DOB:  10-13-1934                                       12/02/2008  [id number]   The patient is an end-stage renal failure patient with a left brachial  cephalic arteriovenous fistula.  She has a central vein occlusion from  fistulogram carried out 11/30/2008.   She states she is dialyzing regularly without difficulty.  Dialysis runs  3 hours and 45 minutes.   Note documentation from [facility] indicating poor  clearance.   Limited access sites available at this time.  Would not recommend  takedown of this AV fistula provided it is functioning adequately, there  are no indications that it is not.   [name], M.D.  Electronically Signed   PGH/MEDQ  D:  12/02/2008  T:  12/03/2008  Job:  [id number]   cc:   [facility]

## 2011-05-15 NOTE — Op Note (Signed)
[name], [name]                ACCOUNT NO.:  [id number]   MEDICAL RECORD NO.:  [id number]          PATIENT TYPE:  OIB   LOCATION:  6741                         FACILITY:  [facility]   PHYSICIAN:  Jessy Oto. Fields, MD  DATE OF BIRTH:  1934-03-30   DATE OF PROCEDURE:  01/13/2009  DATE OF DISCHARGE:                               OPERATIVE REPORT   PROCEDURE:  Right upper arm loop graft.   PREOPERATIVE DIAGNOSIS:  End-stage renal disease.   POSTOPERATIVE DIAGNOSIS:  End-stage renal disease.   ANESTHESIA:  Local with IV sedation.   ASSISTANT:  Chad Cordial, PA   OPERATIVE FINDINGS:  1. A 4-7 mm tapered PTFE graft.  2. High brachial bifurcation.   OPERATIVE DETAILS:  After obtaining informed consent, the patient was  taken to the operating room.  The patient was placed in supine position  on the operating table.  After adequate sedation, the patient's entire  right upper extremity was prepped and draped in usual sterile fashion.  Local anesthesia was infiltrated near the antecubital crease.  A  longitudinal incision was made in this location carried down through the  subcutaneous tissues down to the level of the brachial artery.  The  artery was fairly small and there was also an additional small branch  also in the antecubital region.  It was thought that the patient most  likely had a high brachial bifurcation.  At this point, it was decided  that the patient would need an upper arm loop graft.  Local anesthesia  was infiltrated up in the axilla.  Longitudinal incision was made in  this location carried down through subcutaneous tissues down to level of  the deep brachial vein.  This was dissected free circumferentially.  Small side branches were ligated and divided between silk ties.  Vein  was of good quality approximately 5-mm in diameter.  The brachial artery  was dissected free circumferentially.  There were three large side  branches coming off of this at the level of  the axilla.  These were all  dissected free circumferentially and vessel loops placed around these.  Dissection was carried up into the axilla and the artery dissected free  circumferentially and a vessel loop placed above high in the axilla as  well.  Next, a subcutaneous tunnel was created in a loop configuration  and a 4-7 mm PTFE graft placed in this tunnel with the 4-mm end of the  graft on the lateral side of the upper arm.  The patient was given 5000  units of intravenous heparin.  Vessel loops were used to control the  brachial artery proximally and distally.  Longitudinal opening was made  in the brachial artery just above the takeoff of the two large branches.  The 4-mm end of the graft was slightly beveled.  This was then sewn to  end graft to side of artery using a running 6-0 Prolene suture.  Just  prior to completion of anastomosis, it was forebled, backbled, and  thoroughly flushed.  Anastomosis was secured.  Clamp was moved down to  the apex of  the graft.  There was good pulsatile flow in the graft at  this point.  Next, the graft was pulled taut to length.  The distal deep  brachial vein was dissected free circumferentially and ligated  proximally with a 2-0 silk tie.  The vein was controlled proximally with  a fine bulldog clamp and transected.  The vein was opened  longitudinally.  The graft was beveled and sewn end of graft to end of  vein using a running 6-0 Prolene suture.  Just prior to completion of  anastomosis, it was forebled, backbled, and thoroughly flushed.  Anastomosis was secured, clamps were released, and there was good  palpable thrill above the graft immediately.  Next, hemostasis was  obtained.  Subcutaneous tissues of both incisions were reapproximated  using running 3-0 Vicryl suture.  Skin of  both incisions were closed with a 4-0 Vicryl subcuticular stitch.  The  patient tolerated the procedure well and there were no complications.  Instrument,  sponge, and needle counts were correct at the end of the  case.  The patient was taken to recovery room in stable condition.  The  patient had a palpable radial pulse at the end of the case.      Jessy Oto. Fields, MD  Electronically Signed     CEF/MEDQ  D:  01/13/2009  T:  01/14/2009  Job:  [id number]

## 2011-05-15 NOTE — Op Note (Signed)
NAMEGWENIVERE, [name]                ACCOUNT NO.:  [id number]   MEDICAL RECORD NO.:  [id number]          PATIENT TYPE:  AMB   LOCATION:  SDS                          FACILITY:  [facility]   PHYSICIAN:  VAnnamarie Major IV, MDDATE OF BIRTH:  [birth year]   DATE OF PROCEDURE:  DATE OF DISCHARGE:  12/31/2007                               OPERATIVE REPORT   PREOPERATIVE DIAGNOSES:  End-stage renal disease, thrombosed left arm  fistula.   POSTOPERATIVE DIAGNOSES:  End-stage renal disease, thrombosed left arm  fistula.   PROCEDURE PERFORMED:  Ultrasound-guided access of the left internal  jugular vein.  Diatek catheter.   TYPE OF ANESTHESIA:  MAC.   COMPLICATIONS:  None.   FINDINGS:  Catheter tip in cavoatrial junction.   DESCRIPTION OF PROCEDURE:  The patient was identified in the holding  area and taken to room 6.  She was placed supine on the table.  Time out  was called and antibiotics were given.  The patient was prepped and  draped in a standard sterile fashion.  The left internal jugular vein  was evaluated with ultrasound and found to be widely patent.  A 1%  lidocaine was used for local anesthesia.  Using an ultrasound, the left  internal jugular vein was accessed with a 19-gauge needle.  A 0.35 wire  was advanced into the anterior vena cava under fluoroscopic  visualization.  Next, a subcutaneous tract was dilated with the  appropriate dilators.  Peel-away sheath was then placed under  fluoroscopic visualization.  Next, catheter was placed into the peel-  away sheath, which was then removed.  The tip was placed at the  cavoatrial junction and a site for the skin exit site was selected.  This was anesthetized with 1% lidocaine and the subcutaneous tunnel was  then dilated.  A 32-cm catheter was brought out through the skin with  the cuff situated at the skin exit site.  Fluoroscopy was used to  confirm the tip was in the cavoatrial junction.  There were no kinks  within the  catheter.  Both ports were flushed and aspirated without  difficulty.  The catheter was then sutured into position with 3-0 nylon.  Skin incision in the neck was closed with 4-0 Vicryl.  Dermabond was  placed.  Catheter was filled with appropriate volumes of heparin.  The  patient tolerated the procedure well and was returned to the recovery  room in a stable condition.           ______________________________  V. Leia Alf, MD  Electronically Signed     VWB/MEDQ  D:  12/31/2007  T:  01/01/2008  Job:  [id number]

## 2011-05-15 NOTE — Op Note (Signed)
NAMELAVESHA, [name]                ACCOUNT NO.:  [id number]   MEDICAL RECORD NO.:  [id number]          PATIENT TYPE:  AMB   LOCATION:  SDS                          FACILITY:  [facility]   PHYSICIAN:  Nelda Severe. Kellie Simmering, M.D.  DATE OF BIRTH:  1934/06/30   DATE OF PROCEDURE:  02/12/2008  DATE OF DISCHARGE:  02/12/2008                               OPERATIVE REPORT   PREOPERATIVE DIAGNOSIS:  End-stage renal disease.   POSTOPERATIVE DIAGNOSIS:  End-stage renal disease.   PROCEDURE:  1. Takedown of thrombosed left upper arm AV fistula.  2. Insertion of left forearm AV Gore-Tex graft in brachial artery to      brachial vein (4 mm - 7 mm stretch).   SURGEON:  Nelda Severe. Kellie Simmering, M.D.   FIRST ASSISTANT:  Jacinta Shoe, P.A.   ANESTHESIA:  Local.   PROCEDURE:  The patient was taken to the operating room and placed in  the supine position at which time the left upper extremity was prepped  with Betadine scrub and solution and draped in routine sterile manner.   After infiltration of 1% Xylocaine with epinephrine, a transverse  incision was made in the antecubital area where a previous upper arm  fistula had been created.  This was thrombosed and not revisable.  The  brachial artery was exposed and encircled proximally and distally where  the fistula had been anastomosed.  The brachial veins in this area were  inadequate.  A second incision was made just proximally in the distal  upper arm.  A search for the basilic vein root yielded no adequate  basilic vein.  The brachial vein proximally was slightly larger in the  3.5 to 4 mm range, not excellent by any means but felt to be borderline  but possibly adequate.  Therefore, a 4 x 7-mm stretch Gore-Tex graft was  delivered through a loop-shaped tunnel in the forearm after infiltrating  with 1% Xylocaine using a small counterincision at the apex of the loop.  The previous fistula was transected at the arterial anastomosis which  was actually on  the radial artery where it had bifurcated slightly  proximal to this.  The 4-mm end of the graft was spatulated and  anastomosed end-to-side to this previous anastomotic area with 6-0  Prolene.  The vein was ligated distally, opened with a 15 blade, and  extended with Potts scissors.  It would accept a 3.5 to 4-mm dilator.  The graft was spatulated and anastomosed end-to-side with 6-0 Prolene.  The clamps were then released.  There was good  flow with the Doppler in the graft and a strong pulse with a palpable  thrill.  The wounds were closed in layers with Vicryl in subcuticular  fashion.  A sterile dressing was applied.   The patient was taken to the recovery room in satisfactory condition.      Nelda Severe Kellie Simmering, M.D.  Electronically Signed     JDL/MEDQ  D:  02/12/2008  T:  02/13/2008  Job:  [id number]

## 2011-05-15 NOTE — Op Note (Signed)
[name], [name]                ACCOUNT NO.:  [id number]   MEDICAL RECORD NO.:  [id number]          PATIENT TYPE:  AMB   LOCATION:  SDS                          FACILITY:  [facility]   PHYSICIAN:  Judeth Cornfield. Scot Dock, M.D.DATE OF BIRTH:  Feb 18, 1934   DATE OF PROCEDURE:  DATE OF DISCHARGE:  04/15/2008                               OPERATIVE REPORT   PREOPERATIVE DIAGNOSIS:  Chronic renal failure.   POSTOPERATIVE DIAGNOSIS:  Chronic renal failure.   PROCEDURE:  New left upper arm AV graft.   SURGEON:  Judeth Cornfield. Scot Dock, M.D.   ASSISTANT:  Nurse.   ANESTHESIA:  Local with sedation.   TECHNIQUE:  The patient was taken to the operating room, sedated by  anesthesia.  The left upper extremity was prepped and draped in the  usual sterile fashion.  After the skin was infiltrated with 1%  lidocaine, a longitudinal incision was made above the antecubital level  where the old graft which was anastomosed up to the basilic vein was  identified.  It was ligated at both ends and a segment was excised.  The  brachial artery was then dissected free beneath the fascia.  There was  some scar tissue present.  The artery was somewhat small.  I did  interrogate with the Doppler to be sure that this was the brachial  artery, which it was.  Separate longitudinal incision was made beneath  the axilla, and the high brachial vein was dissected free.  A 4-7 mm  graft was then tunneled in the upper arm, and the patient was  heparinized.  The brachial artery was clamped proximally and distally,  and a longitudinal arteriotomy was made.  A segment of the 4-mm of the  graft was excised.  The graft was spatulated and sewn end-to-side to the  artery using continuous 6-0 Prolene suture.  The graft was then pulled  to the appropriate length for anastomosis to the high brachial vein.  The vein was ligated distally and spatulated proximally.  The graft was  cut to the appropriate length, spatulated, and sewn  end-to-end of the  vein using continuous 6-0 Prolene suture.  At the completion, there was  a good thrill in the graft and a radial signal with the Doppler.  Hemostasis was obtained in the wounds.  The wounds were closed with a  deep layer of 3-0 Vicryl and the skin closed with 4-0 Vicryl.  Sterile  dressing was applied.  The patient tolerated the procedure well and was  transferred to the recovery room in satisfactory condition.  All needle  and sponge counts were correct.      Judeth Cornfield. Scot Dock, M.D.  Electronically Signed     CSD/MEDQ  D:  04/15/2008  T:  04/16/2008  Job:  [id number]

## 2011-05-15 NOTE — Op Note (Signed)
[name], [name]                ACCOUNT NO.:  [id number]   MEDICAL RECORD NO.:  [id number]          PATIENT TYPE:  AMB   LOCATION:  SDS                          FACILITY:  [facility]   PHYSICIAN:  Rosetta Posner, M.D.    DATE OF BIRTH:  [birth year]   DATE OF PROCEDURE:  03/16/2008  DATE OF DISCHARGE:                               OPERATIVE REPORT   PREOPERATIVE DIAGNOSIS:  End-stage renal disease.   POSTOPERATIVE DIAGNOSIS:  End-stage renal disease.   PROCEDURE:  Right upper arm arteriovenous fistula creation.   SURGEON:  Rosetta Posner, M.D.   ASSISTANT:  Chad Cordial, P.A.-C.   ANESTHESIA:  MAC anesthesia.   COMPLICATIONS:  None.   DISPOSITION:  To the recovery room stable.   DESCRIPTION OF PROCEDURE:  The patient was taken to the operating room  and placed in the position where the area of the right arm was prepped  and draped in the usual sterile fashion.  An incision was made over the  antecubital space and carried down to isolate the cephalic vein and the  brachial artery.  The cephalic vein was of moderate size.  The vein was  mobilized proximally and distally and was ligated distally and divided.  The vein was gently dilated and was found to be adequate size for an AV  fistula creation.  Preoperative mapping had shown patency of the graft  throughout its length.  The vein was mobilized and brought into  approximation of the brachial artery.  The artery was occluded  proximally and distally and was opened with a #11 blade and sewn on with  Pott scissors.  The vein was spatulated and sewn end-to-side to the  artery with the remaining #6-0 Prolene suture.  Clamps were removed and  good pleura was noted.  The wounds were irrigated with saline and  hemostasis with electrocautery.  The wounds were closed with #3-0 Vicryl  in the subcutaneous and the subcuticular tissue.  Benzoin and Steri-  Strips were applied.      Rosetta Posner, M.D.  Electronically Signed     TFE/MEDQ  D:  03/16/2008  T:  03/16/2008  Job:  [id number]

## 2011-05-15 NOTE — Op Note (Signed)
[name], [name]                ACCOUNT NO.:  [id number]   MEDICAL RECORD NO.:  [id number]          PATIENT TYPE:  AMB   LOCATION:  SDS                          FACILITY:  [facility]   PHYSICIAN:  Dorothea Glassman, M.D.    DATE OF BIRTH:  [birth year]   DATE OF PROCEDURE:  12/13/2008  DATE OF DISCHARGE:  12/13/2008                               OPERATIVE REPORT   SURGEON:  Dorothea Glassman, MD   ASSISTANT:  Nurse.   ANESTHETIC:  Local with MAC.   ANESTHESIOLOGIST:  Crissie Sickles. Conrad Hertford, MD   PREOPERATIVE DIAGNOSES:  1. End-stage renal failure.  2. Clotted left upper arm arteriovenous fistula.   POSTOPERATIVE DIAGNOSES:  1. End-stage renal failure.  2. Clotted left upper arm arteriovenous fistula.   PROCEDURE:  Ultrasound-guided right internal jugular Diatek catheter.   OPERATIVE PROCEDURE:  The patient was brought to the operating room in  stable condition.  Placed in supine position.  Ultrasound of the right  neck carried out.  This revealed a compressible patent right internal  jugular vein.   The right neck and chest prepped and draped in sterile fashion.  Skin  and subcutaneous tissues was instilled 1% Xylocaine.  A 18-gauge needle  induced from right internal jugular vein.  0.035 J-wire passed through  the needle into superior vena cava.  This was verified by fluoroscopy.   Site opened with 11 blade.  12, 14, 16 dilators advanced over the  guidewire.  16 dilator and tearaway sheath advanced over the guidewire.  The dilator and guidewire were removed.  The catheter placed through the  sheath to the superior vena cava right atrial junction.  The tearaway  sheath removed.  The catheter was then brought through a subcutaneous  tunnel, divided and hub mechanism assembled.  Flushed with heparin-  saline solution and capped with heparin.  The insertion site closed with  interrupted 3-0 nylon suture.  Catheter fixed skin with interrupted 3-0  nylon suture.  Sterile dressings was applied.   No apparent  complications.  The patient transferred to recovery room in stable  condition.     Dorothea Glassman, M.D.  Electronically Signed    PGH/MEDQ  D:  12/13/2008  T:  12/14/2008  Job:  [id number]

## 2011-05-15 NOTE — Assessment & Plan Note (Signed)
OFFICE VISIT   [name], [name]  DOB:  09/26/1934                                       06/07/2008  [id number]   REASON FOR VISIT:  Left arm swelling.   HISTORY:  This is a 77 year old female who has undergone left upper arm  graft placed on April 15, 2008, by Dr. Scot Dock.  She was seen shortly  thereafter in the office and found to have left arm swelling.  She did  have a left-sided catheter in at that time.  She was told to keep her  arm elevated above her heart whenever possible.  Her catheter was  recently removed, approximately 1 week ago, and she comes back in today  for further evaluation of her swelling.   PHYSICAL EXAMINATION:  Her blood pressure is 185/79, pulse is 74.  In  general, she is well-appearing, no acute distress.  Her left arm is  markedly edematous.  There is an audible thrill within her graft.   ASSESSMENT/PLAN:  Left arm swelling status post left upper arm graft.   PLAN:  At this time, it is likely that her swelling is related to  central venous stenosis.  At this time, I would recommend proceeding  with a diagnostic graft study, and intervention on a central stenosis.  I discussed this with the patient.  We have scheduled her procedure for  Thursday June 18th.   Eldridge Abrahams, MD  Electronically Signed   VWB/MEDQ  D:  06/07/2008  T:  06/08/2008  Job:  [id number]

## 2011-05-15 NOTE — Assessment & Plan Note (Signed)
OFFICE VISIT   SHARONA, MANAK  DOB:  [birth year]                                       03/30/2008  [id number]   The patient was sent over for evaluation for further access.  She has  had multiple previous access attempts.  She had a left upper arm AV  fistula placed, which failed.  She then had a left forearm AV graft,  which she says never functions.  Most recently, she had a right upper  arm fistula placed, which is occluded.  She dialyzes on Monday,  Wednesday, Friday.   PHYSICAL EXAMINATION:  This is a pleasant 77 year old woman who appears  her stated age.  Blood pressure is 143/70, heart rate is 68.  She has a  palpable brachial and radial pulse bilaterally.  She has an upper arm  fistula on the right, which is nonfunctional, as is the fistula on the  left.  She has a nonfunctioning left forearm graft.   I have recommended that we attempt an upper arm graft in the left side.  Hopefully, we will have better luck here than she has had with her other  access procedures.  She does not want to schedule this until April 16.   Judeth Cornfield. Scot Dock, M.D.  Electronically Signed   CSD/MEDQ  D:  03/30/2008  T:  03/31/2008  Job:  [id number]

## 2011-05-15 NOTE — Op Note (Signed)
[name], [name]                ACCOUNT NO.:  [id number]   MEDICAL RECORD NO.:  [id number]          PATIENT TYPE:  OIB   LOCATION:  6733                         FACILITY:  [facility]   PHYSICIAN:  Rosetta Posner, M.D.    DATE OF BIRTH:  1934-02-08   DATE OF PROCEDURE:  04/05/2009  DATE OF DISCHARGE:                               OPERATIVE REPORT   PREOPERATIVE DIAGNOSIS:  End-stage renal disease.   POSTOPERATIVE DIAGNOSIS:  End-stage renal disease.   PROCEDURE:  Left femoral loop arteriovenous Gore-Tex graft.   SURGEON:  Rosetta Posner, MD   ASSISTANT:  Chad Cordial, PA-C   ANESTHESIA:  General endotracheal.   COMPLICATIONS:  None.   DISPOSITION:  To recovery room, stable.   PROCEDURE IN DETAIL:  The patient was taken to the operating room and  placed in supine position.  The area of the left groin and left leg  prepped and draped in usual sterile fashion.  The patient did have a  palpable popliteal pulse and no palpable pedal pulses, but a good  femoral pulse.  An incision was made obliquely just above the inguinal  crease and carried down to isolate the common femoral artery which was  encircled with a blue vessel loop.  The patient did have a posterior  plaque but a normal anterior portion of her common femoral artery.  The  junction of the common femoral vein and saphenous vein was identified  and exposed enough for control.  Two separate incisions were made over  the distal thigh and a loop configuration tunnel was created using [employer].  A 6-mm standard wall graft was brought through the tunnel.  The common femoral vein and junction of the saphenous vein were occluded  with a Cooley clamp, and an incision was made anteriorly at the  junction.  This was started with a 11-blade and extended longitudinally  with Potts scissors.  The graft was spatulated and sewn end-to-side to  the junction of the saphenous vein and common femoral vein with a  running 6-0 Prolene  suture.  Anastomosis was tested and found to be  adequate.  The graft was flushed with heparinized saline and reoccluded.  Next, the patient was given 5000 units of intravenous heparin.  After  adequate circulation time, the common femoral artery was occluded  proximally and distally and was opened with 11-blade and extended  longitudinally with Potts scissors.  A small arteriotomy was created.  The graft was cut to appropriate length and was sewn end-to-side to the  artery with a running 6-0 Prolene suture.  The usual flushing maneuvers  were undertaken and anastomosis was completed.  Flow was restored by  releasing the clamps and an excellent thrill was noted.  The patient was  given 50 mg of protamine to reverse the heparin.  Wounds were irrigated  with saline.  Hemostasis was obtained with electrocautery and the wounds  were  closed with 2-0 Vicryl in the subcutaneous tissue and 3-0 subcuticular  closure in the groin.  The 2 distal incision sites were closed with 3-0  subcuticular Vicryl on the subcuticular and subcutaneous tissue.  A  sterile dressing was applied.  The patient was taken to the recovery  room in stable condition.      Rosetta Posner, M.D.  Electronically Signed     TFE/MEDQ  D:  04/05/2009  T:  04/06/2009  Job:  [id number]

## 2011-05-15 NOTE — Assessment & Plan Note (Signed)
OFFICE VISIT   [name], [name]  DOB:  16-May-1934                                       03/22/2009  [id number]   The patient had attempted thrombectomy with revision of a right upper  arm AV graft by Dr. Donnetta Hutching on March 19 but the vein was too diseased and  required ligation of the axillary vein through the axillary approach.  She began having some bleeding from the site and went to the emergency  department last night.  One skin clip was placed in the incision near  the apex.   On exam today there is no active bleeding or hematoma but there was a  little bit of bloody drainage on the dressing.  I examined this for 10  minutes and no bleeding occurred.  I applied a compression dressing with  4x4s and an Ace wrap and she will return to see [place] on a p.r.n. basis.   Nelda Severe Kellie Simmering, M.D.  Electronically Signed   JDL/MEDQ  D:  03/22/2009  T:  03/23/2009  Job:  [id number]

## 2011-05-15 NOTE — Assessment & Plan Note (Signed)
OFFICE VISIT   [name], [name]  DOB:  [birth year]                                       08/22/2010  [id number]   The patient returns today for followup regarding her recent intervention  performed by Dr. Trula Slade.  She had stenting of her left common iliac and  external iliac arteries.  He also did a diagnostic angiogram of the left  leg, which revealed a patent superficial femoral artery with a high-  grade stenosis in the midportion and at the popliteal level.  He felt  that trying to treat this percutaneously would be quite difficult  because of the difficulty crossing the aortic bifurcation and the size  of her vessels.  She also had disease out into the foot on the left  side.  She has had some discomfort on the sole of her foot, but that  this began after she fell and turned her ankle about a month ago.  She  states that it is not worse while on dialysis and she is able to sleep  at night and has no history of infection, cellulitis or nonhealing  ulcers.   On exam today her blood pressure is 138/80, heart rate is 82,  temperature 97.8, respirations 14.  General:  She is a chronically ill-  appearing female in no apparent distress, alert and oriented x3.  Lungs:  Clear to auscultation.  Cardiovascular:  Regular rhythm, no murmurs.  Lower extremity exam reveals a left AV graft in the thigh to have an  excellent pulse and palpable thrill.  Apparently this graft has been  working well.  Left foot has motion and sensation is not tender, nor is  there calf tenderness.  Today I ordered a lower extremity arterial  Doppler study.  The right leg has ABI of 1.28 and the left leg 1.41,  although the right leg is biphasic and the left leg is monophasic.  I  listened with the Doppler in the left foot and with compression of the  graft, the flow only slightly improves.   I do not think she is having a severe steal syndrome at this time and,  unfortunately,  the options are limited.  Since a percutaneous  intervention is probably not an option, a surgical approach may be the  only possibility, which requires femoral-popliteal grafting which would  be very complex in conjunction with an AV graft in the left thigh.  Certainly her symptoms are not straightforward enough to proceed with  that now.  For the time being we will follow this and she will let us  know if she has any worsening of her symptoms.  I cautioned them about  avoiding pressure sores or infection, and they will be in touch with [place]  if her symptoms worsen.     Nelda Severe Kellie Simmering, M.D.  Electronically Signed   JDL/MEDQ  D:  08/22/2010  T:  08/23/2010  Job:  [id number]

## 2011-05-15 NOTE — Op Note (Signed)
[name], [name]                ACCOUNT NO.:  [id number]   MEDICAL RECORD NO.:  [id number]          PATIENT TYPE:  AMB   LOCATION:  SDS                          FACILITY:  [facility]   PHYSICIAN:  Judeth Cornfield. Scot Dock, M.D.DATE OF BIRTH:  09-18-1934   DATE OF PROCEDURE:  02/18/2009  DATE OF DISCHARGE:  02/18/2009                               OPERATIVE REPORT   PREOPERATIVE DIAGNOSIS:  Chronic kidney disease.   POSTOPERATIVE DIAGNOSIS:  Chronic kidney disease.   PROCEDURE:  Thrombectomy and revision of right upper arm AV graft.   SURGEON:  Judeth Cornfield. Scot Dock, MD   ASSISTANT:  Nurse.   ANESTHESIA:  Local with sedation.   TECHNIQUE:  The patient was taken to the operating room and sedated by  Anesthesia.  The right upper extremity was prepped and draped in the  usual sterile fashion.  The patient had had this graft placed about a  month ago and had an upper arm loop graft placed because she had a high  bifurcation of the brachial artery.  The incision in the axilla was  opened and the venous and arterial limbs of the graft were both  dissected free.  The patient was heparinized.  There was intimal  hyperplasia at the venous anastomosis and I dissected up higher on the  axillary vein to where the vein looked reasonable.  The venous  anastomosis was excised.  Using a #4 Fogarty catheter, the graft  thrombectomy was achieved and there were no problems in pulling the  catheter through the body of the graft.  It was an upper arm loop and I  elected to directly pass the catheter into the arterial anastomosis.  The arterial limb of the graft was divided and the arterial thrombectomy  performed and the arterial anastomosis explored and was patent.  The  arterial end of the graft was sewn back end-to-end with continuous 6-0  Prolene suture.  Next at the venous end, the more proximal vein was  spatulated, it was a good-sized vein.  A 7-mm PTFE was spatulated and  sewn end-to-end to  the vein using continuous 6-0 Prolene suture.  The  graft was then pulled the appropriate length for anastomosis end-to-end  of the old graft with continuous 6-0 Prolene suture.  At the completion,  there was a good thrill in the graft.  Hemostasis was obtained of the  wound and the wound was closed with a deep layer of 3-0 Vicryl and the  skin closed with 4-0 Vicryl.  Sterile dressing was applied.  The patient  tolerated the procedure well and was transferred to recovery room in  satisfactory condition.  All needle and sponge counts were correct.      Judeth Cornfield. Scot Dock, M.D.  Electronically Signed     CSD/MEDQ  D:  02/18/2009  T:  02/19/2009  Job:  [id number]

## 2011-05-15 NOTE — Assessment & Plan Note (Signed)
OFFICE VISIT   NOVARAE, MCFERRAN  DOB:  1934/12/27                                       04/27/2008  [id number]   I saw the patient in the office today to evaluate her swelling in her  left arm.  She had a new left upper arm AV graft placed on 04/15/2008.  She has a left IJ Diatek catheter.  On examination, she has a good bruit  and thrill in her graft.  Her incisions are healing well.  She does have  moderate swelling in the upper arm and forearm and hand.  I have  recommend that she elevate her arm above her heart on 2 to 3 pillows.  I  think once they are using her graft in two more weeks, it will be  important to get her catheter out as soon as possible, and I think this  will likely help the swelling.  If she continues to have swelling after  her catheter has been removed, she will need to have a fistulogram to  look for a central venous stenosis, which could potentially be  ballooned.  She will call if the swelling does not improve after her  catheter is removed.   Judeth Cornfield. Scot Dock, M.D.  Electronically Signed   CSD/MEDQ  D:  04/27/2008  T:  04/28/2008  Job:  [id number]

## 2011-05-15 NOTE — H&P (Signed)
[name], [name]                ACCOUNT NO.:  [id number]   MEDICAL RECORD NO.:  [id number]          PATIENT TYPE:  INP   LOCATION:  2021                         FACILITY:  [facility]   PHYSICIAN:  Corinna L. Conley Canal, MDDATE OF BIRTH:  01-23-1934   DATE OF ADMISSION:  06/27/2008  DATE OF DISCHARGE:                              HISTORY & PHYSICAL   CHIEF COMPLAINT:  Chest pain.   HISTORY OF PRESENT ILLNESS:  [name] is a 77 year old black female with  multiple medical problems who presents with pleuritic chest pain since  Friday.  She rates it 8/10, it is substernal.  She denies cough, fevers,  or chills.  She reports having had a stress test last year, she cannot  recall the cardiologist.  She sometimes rubs Vicks over the area and it  improves.  She is on dialysis, but apparently has been unable to  tolerate a full session since almost a week ago.  Her appetite has been  good.   PAST MEDICAL HISTORY:  1. End-stage renal disease.  2. Diabetes.  3. Hypertension.   MEDICATIONS:  1. Catapres 0.2 mg p.o. t.i.d.  2. Darvon as needed.  3. Flexeril as needed.  4. Glimepiride 2 mg a day.  5. Metoprolol 100 mg p.o. b.i.d.  6. PhosLo.  7. Simvastatin 40 mg a day.  8. Sorbitol as needed.   SOCIAL HISTORY:  The patient is married.  No drinking, smoking, or drug  abuse.   FAMILY HISTORY:  Noncontributory.   REVIEW OF SYSTEMS:  As above, otherwise negative.   PHYSICAL EXAMINATION:  VITAL SIGNS:  Temperature is 98.9, blood pressure  130/50, pulse 77, respiratory rate 18, and oxygen saturation 100% on  room air.  GENERAL:  The patient is splinting and appears uncomfortable.  HEENT:  Normocephalic and atraumatic.  Pupils are equal, round, and  reactive to light.  Sclerae nonicteric.  Moist mucous membranes.  NECK:  Supple.  She does have JVD.  LUNGS:  She has rales at the bases.  CARDIOVASCULAR:  Regular rate and rhythm without murmurs, gallops, or  rubs.  She does have chest wall  tenderness, but it is unclear whether  this reproduces the pain.  ABDOMEN:  Soft, nontender, and nondistended.  GU:  Deferred.  RECTAL:  Deferred.  EXTREMITIES:  No clubbing, cyanosis, or edema.  SKIN:  No rash.  PSYCHIATRIC:  Normal affect.  NEUROLOGIC:  Alert and oriented.  Cranial nerves and sensorimotor exam  were intact.   LABORATORY DATA:  CBC is significant for hemoglobin of 9.9, hematocrit  of 30, otherwise unremarkable.  Sodium 130, potassium 5.2, chloride 90,  bicarbonate 25, and glucose 224.  BUN 50, creatinine 10, and albumin  2.6, otherwise normal liver function tests.  Myoglobin is 490.  Two sets  of cardiac enzymes were negative.  Chest x-ray shows no infiltrate.  CT  angiogram of the chest shows small pericardial and right pleural  effusions and small-to-moderate left pleural effusion, moderate mid and  lower lung atelectasis, cardiomegaly, and coronary artery disease.   EKG shows normal sinus rhythm with new flipped T-waves  in the lateral  leads.   ASSESSMENT/PLAN:  1. Chest pain:  She does have new EKG changes.  I will admit her to      telemetry and rule out myocardial infarction.  I will try and get      records from her cardiologist with regards to her recent stress      test.  Her pain is more pleuritic and may have a reproducible      component.  I will give aspirin and morphine as needed.  Check an      echocardiogram.  Certainly, this could be dialysis related      pericarditis.  I will notify nephrology of her admission.  She is      due for dialysis.  2. I will also check an echocardiogram and a repeat EKG in the morning      and serial cardiac enzymes.  3. End-stage renal disease.  I will consult nephrology.  4. Diabetes.  Continue Amaryl and monitor blood sugars.  5. Hypertension.  Continue Catapres.  6. Hyperlipidemia.  Continue simvastatin.      Corinna L. Conley Canal, MD  Electronically Signed     CLS/MEDQ  D:  06/27/2008  T:  06/28/2008   Job:  [id number]

## 2011-05-15 NOTE — Op Note (Signed)
[name], VESSELS                ACCOUNT NO.:  [id number]   MEDICAL RECORD NO.:  [id number]          PATIENT TYPE:  AMB   LOCATION:  SDS                          FACILITY:  [facility]   PHYSICIAN:  Rosetta Posner, M.D.    DATE OF BIRTH:  07/12/1934   DATE OF PROCEDURE:  03/18/2009  DATE OF DISCHARGE:  03/18/2009                               OPERATIVE REPORT   PREOPERATIVE DIAGNOSIS:  End-stage renal disease with recurrent  occlusion of right upper arm AV Gore-Tex graft.   POSTOPERATIVE DIAGNOSIS:  End-stage renal disease with recurrent  occlusion of right upper arm AV Gore-Tex graft.   PROCEDURE:  Attempted thrombectomy and revision followed by ligation of  axillary vein, right upper extremity surgery.   SURGEON:  Rosetta Posner, MD   ASSISTANT:  Chad Cordial, PA-C   ANESTHESIA:  MAC.   COMPLICATIONS:  None.   DISPOSITION:  Recovery room stable.   PROCEDURE IN DETAIL:  The patient was taken to operating room and placed  in supine position where the right arm prepped and draped in a sterile  fashion.  The patient had had a recent thrombectomy and revision of the  right upper arm graft on February 18, 2009.  Using local anesthesia,  incision made over the prior incision carried down.  The patient did  have moderate-size hematoma.  The graft was opened near the venous  anastomosis.  The vein was very sclerotic and was exposed as high as it  could be exposed in the arm.  A four dilator passed through this with  some resistance.  The graft itself was thrombectomized.  This was a 4-mm  anastomosis onto the artery and the artery flow was sluggish, therefore  the graft was opened near the arterial anastomosis, the graft was  thrombectomized at this level and a four dilator passed through the  arterial anastomosis.  The patient had very brisk clotting of her blood  as soon as the thrombectomy was undertaken, therefore, she was given  7000 units of intravenous heparin.  The vein was  exposed as high as  could be exposed and was debrided once torn up to this point.  A new 7-  mm Gore-Tex graft was brought into the field and was sewn end-to-end to  the vein with a running 6-0 Prolene suture.  Clamps removed from the  vein and there was continued tear in the vein up above where I could  safely get to perform anastomosis well up into the axilla.  Decision was  made to abandon further attempts.  The vein was ligated above the area  of tear in the vein with a 2-0 silk tie.  The graft was divided near the  arterial anastomosis and was oversewn with a 6-0 Prolene suture.  The  patient was given 50 mg of protamine to reverse the heparin.  Wound was  irrigated with saline.  Hemostasis was obtained with electrocautery.  Wound was closed with 3-0  Vicryl in the subcutaneous and subcuticular tissue and Steri-Strips were  applied.  The patient does have an indwelling cuffed catheter  and will  have a new allograft in the next week or 2 on a non-dialysis day.      Rosetta Posner, M.D.  Electronically Signed     TFE/MEDQ  D:  03/18/2009  T:  03/19/2009  Job:  [id number]

## 2011-05-15 NOTE — Op Note (Signed)
NAMELATISH, UMBAUGH                ACCOUNT NO.:  [id number]   MEDICAL RECORD NO.:  [id number]          PATIENT TYPE:  AMB   LOCATION:  SDS                          FACILITY:  [facility]   PHYSICIAN:  VAnnamarie Major IV, MDDATE OF BIRTH:  23-Sep-1934   DATE OF PROCEDURE:  06/17/2008  DATE OF DISCHARGE:                               OPERATIVE REPORT   PREOPERATIVE DIAGNOSIS:  Left arm swelling.   POSTOPERATIVE DIAGNOSIS:  Left arm swelling.   PROCEDURES PERFORMED:  1. Ultrasound access, left upper arm graft.  2. Dialysis graft study.   PROCEDURE IN DETAIL:  The patient identified in the holding area and  taken to room #7.  She was placed supine on the table.  The left arm was  prepped and draped in a standard sterile fashion.  The left upper arm  graft was evaluated by ultrasound and was found to be widely patent.  Lidocaine 1% was used for local anesthesia.  The graft was accessed  under ultrasound guidance with micropuncture needle.  A mandril wire was  placed into the central venous system under fluoroscopic visualization,  and a micropuncture sheath was placed.  Contrast injections were  performed through the sheath.   FINDINGS:  The graft is widely patent.  There is a central venous  occlusion in the left subclavian vein.  Large collaterals are  visualized, the predominant being the internal mammary vein.  There were  also collaterals filling into the left internal jugular vein.  The  contrast was not visualized into the superior vena cava.  Also, the  graft was found to be widely patent and the arterial anastomosis is also  widely patent.   After these images were obtained, did not feel like any intervention  could be performed safely.  A stitch was placed to close the sheath off.  Sheath was removed.  The patient tolerated the procedure well with no  complication.   IMPRESSION:  Central venous occlusion.           ______________________________  V. Leia Alf, MD  Electronically Signed     VWB/MEDQ  D:  06/17/2008  T:  06/18/2008  Job:  [id number]

## 2011-05-18 NOTE — Op Note (Signed)
NAMEMATTY, MONTOTO                ACCOUNT NO.:  [id number]   MEDICAL RECORD NO.:  [id number]          PATIENT TYPE:  OUT   LOCATION:  OMED                         FACILITY:  [facility]   PHYSICIAN:  Firas N. Shadad        DATE OF BIRTH:  April 17, 1934   DATE OF PROCEDURE:  DATE OF DISCHARGE:                                 OPERATIVE REPORT   DESCRIPTION OF PROCEDURE:  Bone marrow biopsy and aspirate.   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  This bone marrow  biopsy to complete myeloma workup.   The patient was brought into short-day.  IV access was obtained without any  difficulty.  The patient was initially placed in the decubitus position  exposing her left iliac crest.  The skin was prepped and draped in a sterile  fashion using Betadine.  Using 2% lidocaine the skin, periosteum and the  subcutaneous tissue was anesthetized.  Conscious sedation was obtained with  2 mg of Versed and 25 mg of Demerol.  Using the Jamshidi needle the aspirate  was obtained without any difficulty as well as the biopsy was also obtained  without any complications.  Overall there was no complication.  She  tolerated the procedure well.  There was no bleeding.  The patient was  instructed to lay flat for the next 35 to 45 minutes and she will return for  follow up in the next week or so to discuss the results of the bone marrow  biopsy.           ______________________________  [facility]  Electronically Signed     FNS/MEDQ  D:  07/29/2006  T:  07/29/2006  Job:  [id number]   cc:   Elzie Rings. Lorrene Reid, M.D.  Fax: [phone]

## 2011-05-18 NOTE — Discharge Summary (Signed)
[name], [name]                ACCOUNT NO.:  [id number]   MEDICAL RECORD NO.:  [id number]          PATIENT TYPE:  INP   LOCATION:  [id number]                         FACILITY:  [facility]   PHYSICIAN:  Ernestene Kiel, M.D.DATE OF BIRTH:  08-Oct-1934   DATE OF ADMISSION:  01/04/2005  DATE OF DISCHARGE:  01/11/2005                                 DISCHARGE SUMMARY   ADMISSION DIAGNOSES:  1.  Pneumonia bilateral in lower lobes with hemoptysis.  2.  Chronic obstructive pulmonary disease now with acute exacerbation and      acute laryngitis.  3.  Hypertensive heart disease, uncontrolled.  4.  Renal insufficiency, now creatinine 2.1.  5.  Degenerative joint disease of extremities, mild.  6.  Obesity, moderate.  7.  Hypercholesterolemia.   DISCHARGE DIAGNOSES:  1.  Pneumonia and subsegmental atelectasis in bases improving.  2.  Chronic obstructive pulmonary disease with acute asthmatic bronchitis      resolving.  3.  Acute laryngitis resolving.  4.  Hypertensive heart disease uncontrolled, improved.  5.  Renal insufficiency, moderate.  6.  Hyperuricemia, mild.  7.  Degenerative joint disease of extremities.  8.  Obesity, moderate.   REASON FOR HOSPITALIZATION:  The patient is a 77 year old black female with  known long history of hypertensive heart disease who was admitted with  complaints of frequent cough, productive, bloody sputum for two days prior  to admission, with anterior pleuritic-type chest pain and was admitted for  further care.   ALLERGIES:  NO KNOWN DRUG ALLERGIES.   LABS AND STUDIES:  EKG on January 04, 2005 showed sinus tachycardia.  The  patient had a V/Q lung scan which showed there is a probability of pulmonary  embolus, 20-80% risk.  On January 08, 2005 a chest x-ray showed no acute  abnormality.  On January 04, 2005 CBC showed WBC 11.6, hemoglobin 9.4,  hematocrit 27.7, platelets 301.  On January 09, 2005 WBC 6.7, hemoglobin  10.0, hematocrit 30.4, platelets  430.  On January 04, 2005 D-Dimer was 1.27.  On January 04, 2005 chemistry was within normal limits, glucose 190,  creatinine 2.2, albumin 2.9.  On January 11, 2005 chemistries showed a  glucose of 120, BUN 25, creatinine 2.8.  On January 11, 2005 uric acid was  8.7.  On January 04, 2005 CK 608 and CK-MB 8.9, troponin was 0.02.  On  January 06, 2005 lipid profile showed cholesterol 173, HDL cholesterol 47,  LDL of 102, triglyceride 121.  Blood cultures on January 04, 2005 showed no  growth for five days.   HOSPITAL COURSE:  The patient was admitted, CT scan of the chest and a V/Q  scan was obtained.  Sputum culture was ordered and obtained, but was  considered not acceptable specimen.  The patient was started on albuterol  and Atrovent nebs q.4 hours for the productive cough.  The patient was given  Robitussin-DM 10 mL q.4 hours p.r.n. for cough and scheduled t.i.d. after  the sputum collected.  The patient was started on cefepime 1 gram IV q.12  hours and also the patient was started  on Avalide 400 mg p.o. q.a.m., blood  cultures were obtained which were negative.  The patient was started on  ___________ 0.25 mg p.o. q. a.c. and h.s. and Tussionex 5 mL p.o. q.h.s.  Atrovent was changed to t.i.d. and p.r.n.  Also incentive spirometer was  added to the plan of care.  Zocor was also added to the plan of care.  The  patient was started on Lasix, Norvasc and labetalol and for pain control the  patient was given Darvocet-N 100 q.4 hours p.r.n. for pain.  For the  patients sore throat she was given Chloraseptic spray p.r.n.  The patient  went from a fluid diet to a mechanical soft, low fat, no added salt diet.  Her Lasix was increased to 80 mg p.o. b.i.d. and also the patient was  started on Avapro 150 mg tab p.o. daily.  The cefepime was discontinued.  The patient was started on Advair 250/50 Diskus q. a.m. and q.h.s.  The  p.r.n. albuterol nebs were discontinued.  The patient was started on   albuterol metered dose inhaler 2 puffs q.4 hours p.r.n. for chest tightness  and frequent cough.  Increased mobility was encouraged in the room.  Her  Lasix was decreased to 80 mg tab p.o. daily.   DISCHARGE CONDITION:  The patient was discharged home in stable condition.  Continued to have slow improvement with less cough and less shortness of  breath, hoarseness was also still present with slight improvement.  Temperature prior to discharge was 98.4, blood pressure is 128/62, pulse 70,  respirations 20, weight 197.6.   DISCHARGE MEDICATIONS:  1.  Advair Diskus 250/50 q. a.m. and q.h.s.  2.  Albuterol metered dose inhaler 2 puffs q.4 hours p.r.n. for chest      tightness and cough.  3.  Caduet 10-20 tab1daily.  4.  Labetalol 300 mg tab1 b.i.d.  5.  Avelox 400 mg tab1 daily x5 more days.  6.  Tussionex suspension 5 mL q. a.m. and q.h.s. for cough.  7.  Robitussin 10 mL q.i.d. for sputum, over the counter.  8.  Allopurinol 100 mg tab1 daily.  9.  Avapro 150 mg tab1 daily.   DISCHARGE INSTRUCTIONS:  Pain management -- Darvocet-N 100 1 q.6 hours  p.r.n. for pain.  Diet -- low sodium, low fat, low cholesterol diet.  Followup appointment -- keep March 05, 2005 at 11:45 a.m. appointment.      DDR/MEDQ  D:  03/13/2005  T:  03/13/2005  Job:  [id number]

## 2011-05-18 NOTE — Discharge Summary (Signed)
NAMESHEKILA, [name]                ACCOUNT NO.:  [id number]   MEDICAL RECORD NO.:  [id number]          PATIENT TYPE:  INP   LOCATION:  2021                         FACILITY:  [facility]   PHYSICIAN:  Corinna L. Conley Canal, MDDATE OF BIRTH:  12/07/1934   DATE OF ADMISSION:  06/27/2008  DATE OF DISCHARGE:  06/29/2008                               DISCHARGE SUMMARY   DISCHARGE DIAGNOSES:  1. Chest pain, suspect musculoskeletal versus dialysis related      pericarditis.  2. End-stage renal disease.  3. Diabetes.  4. Hypertension.  5. Hyperlipidemia.   DISCHARGE MEDICATIONS:  She may continue Catapres, I believe is 0.2 mg  p.o. t.i.d., glimepiride 2 mg daily, metoprolol 100 mg p.o. b.i.d.,  simvastatin 40 mg a day, sorbitol as needed for constipation, PhosLo as  previous, and has been prescribed Dilaudid 2 mg every 4 hours as needed  for pain or Tylenol as needed for pain.   DIET:  Diabetic, low-salt.   CONDITION:  Stable.   CONSULTATIONS:  Nephrology follow up with Dr. Maury Dus.   PROCEDURES:  Hemodialysis.   ACTIVITY:  No driving while on pain medications.   LABORATORY DATA:  CBC significant for hemoglobin of 9.9, hematocrit 30,  otherwise unremarkable.  Sodium 130, potassium 5.2, chloride 90,  bicarbonate 25, glucose 224, BUN 50, creatinine 10.  Liver function  tests significant for an albumin of 2.6, magnesium was 2.9, phosphorus  2.8.  Point-of-care enzymes negative.  Lipase normal.  TSH 1.059.   SPECIAL STUDIES AND RADIOLOGY:  EKG showed normal sinus rhythm, flipped  T-waves laterally.  Chest x-ray showed bibasilar atelectasis versus  infiltrate.  CT angiogram of the chest showed no PE, small pericardial  and right pleural effusions, and small-to-moderate left pleural  effusion, moderate mid and lower lung atelectasis, cardiomegaly and  coronary artery disease.   HISTORY AND HOSPITAL COURSE:  [name] is a 77 year old black female who  presented with pleuritic sharp  chest pain.  She was somewhat difficult  historian.  She had had a negative Cardiolite about 6 months prior at  [facility] and she had been unable to tolerate dialysis since  about a week prior to admission.  It is not entirely clear to me why.  She had normal vital signs.  She was splinting and uncomfortable on  exam.  She did have some chest wall tenderness but was unable to specify  whether this reproduced the pain or not.  She had no murmurs, gallops,  or rubs and her lungs were clear.  She was admitted and ruled out for  MI.  CAT scan showed no PE.  She was felt to have either dialysis-  related pericarditis versus musculoskeletal pain.  The pain did resolve  after she received dialysis.  An echocardiogram was done and showed  ejection fraction of 70%, no ventricular wall motion  abnormalities, elevated left ventricular end-diastolic filling pressure,  very mild aortic valve stenosis, and mildly increased estimated peak  right ventricular systolic pressure.  The patient was discharged home as  her chest pain had resolved after dialysis.  Her other  medical problems  remained stable during the hospitalization.      Corinna L. Conley Canal, MD  Electronically Signed     CLS/MEDQ  D:  07/05/2008  T:  07/06/2008  Job:  [id number]   cc:   [name], M.D.  Elzie Rings Lorrene Reid, M.D.

## 2011-05-18 NOTE — Discharge Summary (Signed)
NAMEARYAN, [name]                ACCOUNT NO.:  [id number]   MEDICAL RECORD NO.:  [id number]            PATIENT TYPE:   LOCATION:                                 FACILITY:   PHYSICIAN:  Ashby Dawes. Polite, M.D.      DATE OF BIRTH:   DATE OF ADMISSION:  DATE OF DISCHARGE:                                 DISCHARGE SUMMARY   __________   DISCHARGE DIAGNOSES:  1.  Acute on chronic renal failure.  Creatinine 6.3.  __________ Patient has      been has been seen in consultation with nephrology. delete__________  2.  Anemia. __on________  Aranesp.  3.  Diabetes.  __________  4.  Hypertension, controlled __________ optimum.  5.  History of asthma.  6.  Osteoarthritis.   DISCHARGE MEDICATIONS:  1.  Catapres 0.2 mg t.i.d.  2.  Norvasc 10 mg daily.  3.  Zocor 40 mg daily.  4.  Patient will return to her Lipitor 20 mg daily __________  __________ b.i.d.  Nephro-Vite daily.  PhosLo t.i.d.  Aranesp weekly.   DISPOSITION:  Patient will be discharged to home for further outpatient  follow-up with nephrology associates, Dr. __________. Procedures _________  patient currently awaiting AV fistula to left arm.   STUDIES:  A 24-hour urine greater than 3000 protein.  BMET creatinine at  time of discharge 6.2, normal potassium.  CBC with hemoglobin 8.7. Iron  studies as noted above, serum iron 47, total iron binding capacity 39,  percent sat 20.  Calcium 8.  Hemoccult negative.   HISTORY OF PRESENT ILLNESS:  A 77 year old female with above medical  problems sent to the ED as the outpatient labs revealed renal failure with  creatinine of approximately 6.3.  Admission _was deemed necessary_________  further evaluation and treatment.  Please note, on admission, patient did  not have any sign of volume overload nor acidosis but did have hyperkalemia.   PAST MEDICAL HISTORY:  As stated above.   MEDICATIONS ON ADMISSION:  1.  __________.  2.  Albuterol p.r.n.  3.  Caduet 10/20.  4.  __________  daily.   SOCIAL HISTORY:  No tobacco x10 years, prior half pack a day.  No alcohol,  no drugs.   PAST SURGICAL HISTORY:  1.  Tubal ligation.  2.  Appendectomy.  __________   HOSPITAL COURSE:  Patient was admitted to medicine floor bed for evaluation  and treatment of acute renal failure with  hyperkalemia.  Patient's  hyperkalemia was treated with Kayexalate.  On admission, had follow-up labs.  Patient had 24-hour urine as well as iron studies and was seen in  consultation by nephrology.  Patient's renal ultrasound was negative for  hydronephrosis.  It did show a small right kidney, increase in renal  __________ echogenicity.  Patient had several other studies ordered by  nephrology which are pending at this time.  Most importantly, serum protein  electrophoresis.  Patient was also seen in consultation by vascular surgery  for IV access in the left arm.  Patient's hospitalization has been  uncomplicated and currently at this  time is awaiting AV graft in the left  arm for immanent dialysis.  Patient will continue medications as outlined  above and will order close up nephrology on an outpatient basis.  Patient is  planned to be discharged post AV graft.      Ashby Dawes. Polite, M.D.  Electronically Signed     RDP/MEDQ  D:  03/25/2006  T:  03/26/2006  Job:  [id number]

## 2011-05-18 NOTE — Consult Note (Signed)
NAMEREJINA, LUNDIN                ACCOUNT NO.:  [id number]   MEDICAL RECORD NO.:  [id number]          PATIENT TYPE:  INP   LOCATION:  5705                         FACILITY:  [facility]   PHYSICIAN:  Caren Griffins B. Lorrene Reid, M.D.DATE OF BIRTH:  10-04-1934   DATE OF CONSULTATION:  03/21/2006  DATE OF DISCHARGE:                                   CONSULTATION   We are asked to see this 77 year old black female who has a history of  longstanding hypertension, COPD, diabetes and hyperlipidemia because of  chronic kidney disease.   She was a patient in our office in the early 1990s but was lost to follow up  and her cold records were subsequently lost by our storage company. So the  details of her workup at that time are not available. However, she does  state that she was subsequently followed for many years by Dr. Katherine Roan  and only recently changed over to the care of Dr. Maury Dus.   Her serum creatinine on January 04, 2005 was 2.2 and on January 09, 2005 was  2.6 during hospital admission for some pulmonary issues. There is no  urinalysis data on the chart from that time.   She is presently admitted to the hospitalist service from her new primary  M.D.'s office, Dr. Maury Dus, secondary to an elevated serum creatinine  of 6.1 and a BUN of 62. Urinalysis shows greater than 300 milligrams percent  protein without cellular activity. Kidneys are bilaterally small, the right  measuring 7 and the left 8.8 cm by ultrasound and of note and ultrasound  done in January 2003 by Dr. Katherine Roan also showed bilaterally small kidneys  with the right measuring at that time 8 cm and the left 0.7 Echogenicity at  that time was normal.   The patient admits to longstanding hypertension since the 1970s and  indicates that she has never been or stayed under very good control.   She has had diabetes for several years and states that she was on a  medication per Dr. Katherine Roan which she stopped taking on  her own and does  not remember what it was called. She cannot remember if she has ever had  diabetic retinopathy but does note that she went to see an ophthalmologist  and states that she forgot to go back to have laser therapy to her eyes.   She does not use over-the-counter medications in the form of nonsteroidals  and does recall that I had told her many years ago that she has had Tylenol  only. She has not been on any ACE or ARB therapy recently.   We have no recent records of serum creatinine data other than those  mentioned above from either Dr. Katherine Roan or Dr. Noland Fordyce offices.   PAST MEDICAL HISTORY:  1.  Longstanding and variably controlled hypertension.  2.  Diabetes not currently on therapy.  3.  History of retinopathy.  4.  Cataracts.  5.  COPD with a history of asthmatic bronchitis.  6.  History of pneumonia.  7.  Gout.  8.  Hyperlipidemia.  9.  Enlarged heart per the patient.  10. Hyperlipidemia.  11. History of noncompliance.   Her outpatient medications were limited to metoprolol 100 milligrams a day,  Caduet 10/20 once a day, Darvocet, Brometane cough syrup and albuterol. She  stopped her fluid pill and diabetes medications prior to admission and we do  not know what those were. Currently, she is on metoprolol 100 milligrams  b.i.d., Norvasc 10 milligrams a day, Zocor 40 milligrams a day, Catapres  every 8 hours as needed, Lovenox and Protonix 40 a day.   FAMILY HISTORY:  Positive for hypertension in her mother, aunt, uncle, two  sisters and her one living daughter and for diabetes in her sisters. Her  mother had some sort of kidney trouble but was not on dialysis.   SOCIAL HISTORY:  The patient has been married for 48 years. She had two  daughters-the older of whom died in 1999/03/30 from what sounds like complications  related to drug use. She has one living daughter with hypertension. She has  a remote history of alcohol but none recently. She formerly worked at  the  old [employer] doing [place] jobs. She is right-  handed.   REVIEW OF SYSTEMS:  Positive for nausea, dry heaves early in the morning,  occasional chest pain, shortness of breath with exertion, fair appetite, leg  cramps especially at night, poor sleep, but increased daytime sleepiness,  cold feeling, poor energy and fair concentration.   On exam today, she is not acutely ill. Blood pressure 122/49 compared to  200/88 on admission, O2 sat 95% on room air. She has some increased  pigmentation changes around her eyes and her mouth. There is no JVD. She has  a beefy neck. I cannot hear any bruits. Her breath sounds are diminished but  lungs are clear. Cardiac exam reveals a quiet precordium. S1-S2 no S3.  Positive S4. A 2/6 murmur upper sternal border. No diastolic murmur. No  pericardial friction rub. Abdomen has positive bowel sounds. Mild epigastric  tenderness. No abdominal bruits. No masses. She has 1+ pretibial edema.  Dorsalis pedis and posterior tibial pulses were 1+ and equal. There was no  asterixis.   Electrolytes revealed a sodium of 137, potassium 4.0 (she did require  Kayexalate at the time of admission for hyperkalemia), chloride 110, CO2 21,  BUN 62, creatinine 6.1, calcium 7.9. Hemoglobin 10.6, WBC 4900, platelets  334,000. Urinalysis greater than 300 milligrams percent protein. There was  no chest x-ray done this admission but one done earlier in the month by Dr.  Katherine Roan because of cough showed mild peribronchial thickening and  cardiomegaly.   IMPRESSION:  This is a 77 year old black female who has had longstanding and  variably controlled diabetes who has bilaterally small kidneys, a bland  urinalysis with the exception of protein and a course that is entirely  compatible with progressive renal disease on the basis of nephrosclerosis. I doubt that this is acute-it may well be the natural progression of her  disease. She is very  reluctant to discuss dialysis but I feel certain she is  experiencing some uremic symptoms at this time and will therefore need to  spend more time educating her as she is thinking that God will  heal her  from these issues.   RECOMMENDATIONS:  1.  Save left arm.  2.  Map veins and call CVTS if she will agree to vascular access and      dialysis.  3.  Recheck iron studies.  Follow-up CBC and initiate Aranesp or Procrit if      iron studies are adequate.  4.  Check PTH, calcium and phosphorus and add binders and vitamin D as      indicated.  5.  Control hypertension.  6.  Check lipids.  7.  Try to get recent records from Dr. Katherine Roan and/or Dr. Noland Fordyce office      (Dr. Alyson Ingles is her current primary).  8.  Show dialysis videos.  9.  RD instruction on diet.  10. For completeness, we will check SPEP and UPEP.   We will follow up with her in the morning and our initial goals at this  point will be to get her to agree to consider vascular access and accept the  idea of dialysis as we tweak blood pressure to evaluate vitamin D,  metabolism and manage anemia. We will follow closely with you.           ______________________________  Elzie Rings. Lorrene Reid, M.D.     CBD/MEDQ  D:  03/21/2006  T:  03/23/2006  Job:  [id number]   cc:   [name], M.D.  Fax: [phone]   Ashby Dawes. Polite, M.D.

## 2011-05-18 NOTE — Op Note (Signed)
[name], [name]                ACCOUNT NO.:  [id number]   MEDICAL RECORD NO.:  [id number]          PATIENT TYPE:  INP   LOCATION:  5705                         FACILITY:  [facility]   PHYSICIAN:  Dorothea Glassman, M.D.    DATE OF BIRTH:  1934-10-13   DATE OF PROCEDURE:  03/20/2006  DATE OF DISCHARGE:                                 OPERATIVE REPORT   SURGEON:  P Cameron Sprang, MD   ASSISTANT:  Nurse.   ANESTHETIC:  Local MAC.   PREOPERATIVE DIAGNOSES:  1.  Chronic renal insufficiency.  2.  Thrombosed left brachial cephalic arteriovenous fistula.   POSTOPERATIVE DIAGNOSES:  1.  Chronic renal insufficiency.  2.  Thrombosed left brachial cephalic arteriovenous fistula.   PROCEDURE:  Thrombectomy and revision left brachial cephalic arteriovenous  fistula.   CLINICAL NOTE:  This is a 77 year old African-American female with chronic  renal insufficiency.  Earlier today she underwent creation of a left  brachial cephalic arteriovenous fistula.  This, however thrombosed in the  recovery room.  She is brought back the operating room for re-exploration.   PROCEDURE NOTE:  The patient brought to the operating room stable condition.  Placed in supine position.  Left arm prepped and draped in sterile fashion.  Skin, subcutaneous tissues instilled with 1% Xylocaine with epinephrine.  Subcutaneous sutures were incised in left antecubital fossa.  The incision  reopened.  The left brachial cephalic arteriovenous fistula exposed.  This  was thrombosed.  The brachial artery encircled proximal distal to the  anastomosis and the patient administered 5000 units heparin intravenously.  The brachial artery controlled with bulldog clamps.  The arteriovenous  anastomosis taken down.  There was thrombus present in the vein.  This was  thrombectomized with a 3 Fogarty catheter.  Thrombus was cleared from the  vein.  The vein then flushed with heparin saline solution, controlled with  bulldog clamp.  A  reanastomosis between the vein and artery then carried out  in end-to-side fashion using running 7-0 Prolene suture.  At completion of  this, clamps removed.  Excellent flow was initially present.  However, with  further observation, the flow became sluggish.  The reason for this was  unclear.  No further exploration was decided upon.   Subcutaneous tissue closed using running 3-0 Vicryl suture.  Skin closed  with 4-0 Monocryl.  Steri-Strips applied.   The patient tolerated procedure well.  No apparent complications.  Transferred to recovery in stable condition.      Dorothea Glassman, M.D.  Electronically Signed     PGH/MEDQ  D:  03/25/2006  T:  03/27/2006  Job:  [id number]

## 2011-05-18 NOTE — Op Note (Signed)
[name], [name]                ACCOUNT NO.:  [id number]   MEDICAL RECORD NO.:  [id number]          PATIENT TYPE:  INP   LOCATION:  5705                         FACILITY:  [facility]   PHYSICIAN:  Dorothea Glassman, M.D.    DATE OF BIRTH:  May 21, 1934   DATE OF PROCEDURE:  03/25/2006  DATE OF DISCHARGE:                                 OPERATIVE REPORT   SURGEON:  P Cameron Sprang, MD   ASSISTANT:  Leta Baptist, PA.   ANESTHETIC:  Local with MAC.   PREOPERATIVE DIAGNOSIS:  Chronic renal insufficiency.   POSTOPERATIVE DIAGNOSIS:  Chronic renal insufficiency.   PROCEDURE:  Left brachial cephalic arteriovenous fistula.   OPERATIVE PROCEDURE:  The patient brought to the operating room stable  condition.  Placed in supine position.  Left arm prepped and draped in  sterile fashion.   Skin, subcutaneous tissues instilled 1% Xylocaine with epinephrine.  Longitudinal skin incision made along the left anatomical snuff box.  Dissection carried down.  Cephalic vein exposed at the wrist.  This was very  small less than 2 mm in size.  The vein was therefore not felt to be viable  for fistula creation.   The left antecubital fossa then opened through a transverse incision.  The  antecubital veins were identified.  Cephalic vein was 4 to 5 mm in size.  This was freed.  Ligated distally with 2-0 silk and divided.  Dilated with  heparin saline solution.   Brachial artery then exposed.  Encircled proximally and distally with vessel  loops.  The patient administered 2000 units heparin intravenously.  Brachial  artery controlled proximally distally with bulldog clamps.  Longitudinal  arteriotomy made.  The vein was beveled and anastomosed end-to-side to the  brachial artery using running 7-0 Prolene suture.  Clamps were removed.  Excellent flow present.  Adequate hemostasis obtained.  Sponge instrument  counts correct.   Subcutaneous tissue closed running 3-0 Vicryl suture.  Skin closed with 4-0  Monocryl.  Steri-Strips applied.  The patient tolerated procedure well.  Transferred to recovery room in stable condition.      Dorothea Glassman, M.D.  Electronically Signed    PGH/MEDQ  D:  03/25/2006  T:  03/27/2006  Job:  [id number]

## 2011-05-18 NOTE — Discharge Summary (Signed)
NAMEKIOR, FLORIO                ACCOUNT NO.:  [id number]   MEDICAL RECORD NO.:  [id number]          PATIENT TYPE:  INP   LOCATION:  5705                         FACILITY:  [facility]   PHYSICIAN:  Jerelene Redden, MD      DATE OF BIRTH:  29-Mar-1934   DATE OF ADMISSION:  03/20/2006  DATE OF DISCHARGE:                                 DISCHARGE SUMMARY   ADDENDUM:  Subsequent to the previous dictation, Ms. Kleinhenz was found to  require placement of AV fistula.  She underwent placement of a left Kaufmann  AV fistula on March 25, 2006.  This was done by Dr. Amedeo Plenty.  Unfortunately,  in the recovery room, the AV fistula became obstructed and the patient was  returned to the OR where under local anesthesia, a thrombectomy and revision  of the AV fistula was performed by Dr. Amedeo Plenty.  The patient apparently had  ___________.  This morning she is alert, had no breathing difficulty or  chest pain.  Her blood pressure 160/60 which is within her usual limit.  Pending Dr. Amedeo Plenty approval, we will discharge her as a stat.   DISCHARGE DIAGNOSES:  1.  Longstanding poorly controlled hypertension.  2.  Diabetes.  3.  History of retinopathy and cataracts.  4.  Chronic obstructive pulmonary disease.  5.  History of pneumonia.  6.  Gout.  7.  Hyperlipidemia.  8.  Chronic renal failure.   Note that the patient's creatinine was 6.2 at the time of discharge. At the  time of discharge her medications will consist of Protonix 40 mg daily,  Norvasc 10 mg daily, Zocor 40 mg daily, Amaryl 2 mg daily, Lopressor 100 mg  b.i.d., Nephro-Vite vitamin, PhosLo 667 mg tablet t.i.d., Aranesp which will  be administered on a weekly basis, ferrous sulfate 325 mg b.i.d., Catapres  0.2 mg t.i.d., nitroglycerin patch 0.4 mg per hour applied daily, Tylox 1-2  every 6 hours p.r.n. for pain.  The patient has a follow-up appointment with  Dr. Lorrene Reid of the nephrology service on April 10, 2006 and has also going to  be following up with  Dr. Maury Dus..           ______________________________  Jerelene Redden, MD     SY/MEDQ  D:  03/26/2006  T:  03/27/2006  Job:  [id number]   cc:   Elzie Rings. Lorrene Reid, M.D.  Fax: [phone]   Audree Camel. Alyson Ingles, M.D.  Fax: [phone]

## 2011-06-02 ENCOUNTER — Ambulatory Visit (INDEPENDENT_AMBULATORY_CARE_PROVIDER_SITE_OTHER): Payer: Medicare Other

## 2011-06-02 ENCOUNTER — Inpatient Hospital Stay (INDEPENDENT_AMBULATORY_CARE_PROVIDER_SITE_OTHER)
Admission: RE | Admit: 2011-06-02 | Discharge: 2011-06-02 | Disposition: A | Discharge status: Home / Self Care | Payer: Medicare Other | Source: Ambulatory Visit | Attending: Family Medicine | Admitting: Family Medicine

## 2011-06-02 ENCOUNTER — Inpatient Hospital Stay (HOSPITAL_COMMUNITY)
Admission: EM | Admit: 2011-06-02 | Discharge: 2011-06-28 | DRG: 252 | Disposition: A | Discharge status: Skilled Nursing Facility | Payer: Medicare Other | Attending: Internal Medicine | Admitting: Internal Medicine

## 2011-06-02 DIAGNOSIS — R5381 Other malaise: Secondary | ICD-10-CM | POA: Diagnosis present

## 2011-06-02 DIAGNOSIS — R071 Chest pain on breathing: Secondary | ICD-10-CM | POA: Diagnosis present

## 2011-06-02 DIAGNOSIS — R197 Diarrhea, unspecified: Secondary | ICD-10-CM | POA: Diagnosis present

## 2011-06-02 DIAGNOSIS — I120 Hypertensive chronic kidney disease with stage 5 chronic kidney disease or end stage renal disease: Secondary | ICD-10-CM | POA: Diagnosis present

## 2011-06-02 DIAGNOSIS — I96 Gangrene, not elsewhere classified: Secondary | ICD-10-CM | POA: Diagnosis present

## 2011-06-02 DIAGNOSIS — J449 Chronic obstructive pulmonary disease, unspecified: Secondary | ICD-10-CM | POA: Diagnosis present

## 2011-06-02 DIAGNOSIS — J4489 Other specified chronic obstructive pulmonary disease: Secondary | ICD-10-CM | POA: Diagnosis present

## 2011-06-02 DIAGNOSIS — E785 Hyperlipidemia, unspecified: Secondary | ICD-10-CM | POA: Diagnosis present

## 2011-06-02 DIAGNOSIS — D62 Acute posthemorrhagic anemia: Secondary | ICD-10-CM | POA: Diagnosis present

## 2011-06-02 DIAGNOSIS — L0291 Cutaneous abscess, unspecified: Secondary | ICD-10-CM

## 2011-06-02 DIAGNOSIS — M79609 Pain in unspecified limb: Secondary | ICD-10-CM

## 2011-06-02 DIAGNOSIS — I898 Other specified noninfective disorders of lymphatic vessels and lymph nodes: Secondary | ICD-10-CM | POA: Diagnosis present

## 2011-06-02 DIAGNOSIS — I080 Rheumatic disorders of both mitral and aortic valves: Secondary | ICD-10-CM | POA: Diagnosis present

## 2011-06-02 DIAGNOSIS — N644 Mastodynia: Secondary | ICD-10-CM | POA: Diagnosis present

## 2011-06-02 DIAGNOSIS — E1159 Type 2 diabetes mellitus with other circulatory complications: Principal | ICD-10-CM | POA: Diagnosis present

## 2011-06-02 DIAGNOSIS — N186 End stage renal disease: Secondary | ICD-10-CM | POA: Diagnosis present

## 2011-06-02 DIAGNOSIS — E876 Hypokalemia: Secondary | ICD-10-CM | POA: Diagnosis present

## 2011-06-02 DIAGNOSIS — N2581 Secondary hyperparathyroidism of renal origin: Secondary | ICD-10-CM | POA: Diagnosis present

## 2011-06-02 DIAGNOSIS — G589 Mononeuropathy, unspecified: Secondary | ICD-10-CM | POA: Diagnosis present

## 2011-06-02 DIAGNOSIS — N039 Chronic nephritic syndrome with unspecified morphologic changes: Secondary | ICD-10-CM | POA: Diagnosis present

## 2011-06-02 DIAGNOSIS — Z992 Dependence on renal dialysis: Secondary | ICD-10-CM

## 2011-06-02 DIAGNOSIS — D631 Anemia in chronic kidney disease: Secondary | ICD-10-CM | POA: Diagnosis present

## 2011-06-02 LAB — CBC
MCH: 31.6 pg (ref 26.0–34.0)
MCHC: 32.7 g/dL (ref 30.0–36.0)
Platelets: 328 10*3/uL (ref 150–400)

## 2011-06-02 LAB — DIFFERENTIAL
Basophils Absolute: 0 10*3/uL (ref 0.0–0.1)
Basophils Relative: 0 % (ref 0–1)
Eosinophils Absolute: 0.2 10*3/uL (ref 0.0–0.7)
Monocytes Absolute: 0.7 10*3/uL (ref 0.1–1.0)
Monocytes Relative: 8 % (ref 3–12)
Neutrophils Relative %: 63 % (ref 43–77)

## 2011-06-02 LAB — BASIC METABOLIC PANEL
Calcium: 11.2 mg/dL — ABNORMAL HIGH (ref 8.4–10.5)
Creatinine, Ser: 8.2 mg/dL — ABNORMAL HIGH (ref 0.4–1.2)
GFR calc Af Amer: 6 mL/min — ABNORMAL LOW (ref >60)
GFR calc non Af Amer: 5 mL/min — ABNORMAL LOW (ref >60)
Sodium: 137 mEq/L (ref 135–145)

## 2011-06-03 DIAGNOSIS — I70269 Atherosclerosis of native arteries of extremities with gangrene, unspecified extremity: Secondary | ICD-10-CM

## 2011-06-03 LAB — CBC
HCT: 31.1 % — ABNORMAL LOW (ref 36.0–46.0)
Hemoglobin: 10.1 g/dL — ABNORMAL LOW (ref 12.0–15.0)
MCH: 31.2 pg (ref 26.0–34.0)
MCV: 96 fL (ref 78.0–100.0)
RBC: 3.24 MIL/uL — ABNORMAL LOW (ref 3.87–5.11)

## 2011-06-03 LAB — MRSA PCR SCREENING: MRSA by PCR: NEGATIVE

## 2011-06-04 ENCOUNTER — Observation Stay (HOSPITAL_COMMUNITY): Payer: Medicare Other

## 2011-06-04 ENCOUNTER — Ambulatory Visit: Payer: Medicare Other | Admitting: Vascular Surgery

## 2011-06-04 DIAGNOSIS — L98499 Non-pressure chronic ulcer of skin of other sites with unspecified severity: Secondary | ICD-10-CM

## 2011-06-04 DIAGNOSIS — I739 Peripheral vascular disease, unspecified: Secondary | ICD-10-CM

## 2011-06-04 LAB — BASIC METABOLIC PANEL
CO2: 26 mEq/L (ref 19–32)
Calcium: 10 mg/dL (ref 8.4–10.5)
Chloride: 92 mEq/L — ABNORMAL LOW (ref 96–112)
GFR calc Af Amer: 4 mL/min — ABNORMAL LOW (ref >60)
Sodium: 134 mEq/L — ABNORMAL LOW (ref 135–145)

## 2011-06-04 LAB — CBC
HCT: 31.6 % — ABNORMAL LOW (ref 36.0–46.0)
Hemoglobin: 10.1 g/dL — ABNORMAL LOW (ref 12.0–15.0)
MCH: 30.6 pg (ref 26.0–34.0)
MCHC: 32 g/dL (ref 30.0–36.0)
MCV: 95.8 fL (ref 78.0–100.0)

## 2011-06-04 LAB — PHOSPHORUS: Phosphorus: 6.9 mg/dL — ABNORMAL HIGH (ref 2.3–4.6)

## 2011-06-04 LAB — POCT ACTIVATED CLOTTING TIME: Activated Clotting Time: 122 seconds

## 2011-06-04 LAB — GLUCOSE, CAPILLARY

## 2011-06-04 LAB — PROTIME-INR: Prothrombin Time: 13.7 seconds (ref 11.6–15.2)

## 2011-06-05 DIAGNOSIS — I70269 Atherosclerosis of native arteries of extremities with gangrene, unspecified extremity: Secondary | ICD-10-CM

## 2011-06-05 LAB — BASIC METABOLIC PANEL
BUN: 19 mg/dL (ref 6–23)
CO2: 26 mEq/L (ref 19–32)
Calcium: 9.2 mg/dL (ref 8.4–10.5)
Creatinine, Ser: 7.68 mg/dL — ABNORMAL HIGH (ref 0.4–1.2)
GFR calc Af Amer: 6 mL/min — ABNORMAL LOW (ref >60)
Glucose, Bld: 126 mg/dL — ABNORMAL HIGH (ref 70–99)

## 2011-06-05 LAB — CBC
Hemoglobin: 9.5 g/dL — ABNORMAL LOW (ref 12.0–15.0)
MCH: 31.3 pg (ref 26.0–34.0)
MCHC: 32.1 g/dL (ref 30.0–36.0)

## 2011-06-05 NOTE — H&P (Signed)
NAMEKATALIYA, [name]                ACCOUNT NO.:  [id number]  MEDICAL RECORD NO.:  [id number]           PATIENT TYPE:  O  LOCATION:  [id number]                         FACILITY:  [facility]  PHYSICIAN:  Derrill Kay, MD       DATE OF BIRTH:  1934/08/11  DATE OF ADMISSION:  06/02/2011 DATE OF DISCHARGE:                             HISTORY & PHYSICAL   CHIEF COMPLAINT:  Left fifth toe pain and blackness.  HISTORY OF PRESENT ILLNESS:  [name] is a pleasant 77 year old diabetic African American female who has a significant peripheral vascular disease who has been having left toe turning black for over a week now. She says that over a week ago, it starts to look a little infected.  She went to a podiatrist who removed her toenail and put her on Keflex and since that time, the toe has progressively gotten worse and is now most of it is necrotic.  She says she has had progressive pain that is now in all of the toes and to the mid of the forefoot.  She does have significant history of peripheral vascular disease and actually has had stenting at the left common and external iliac artery in August 2011. At that time, had a high grade stenosis within the superficial femoral and popliteal artery on the left which was difficult to percutaneously address at that time.  She has finally come to the ED because the pain has gotten so severe that she cannot deal with it anymore.  There has been no drainage from this area.  She states that her other toes are starting to look a little bluish also.  She denies any fevers.  No nausea or vomiting.  REVIEW OF SYSTEMS:  Otherwise, negative.  PAST MEDICAL HISTORY: 1. End-stage renal disease, dialysis dependent on Monday, Wednesday,     Friday. 2. Non-insulin dependent diabetes. 3. Hypertension. 4. Hyperlipidemia. 5. Severe peripheral vascular disease, status post stenting. 6. History of myoclonic jerks. 7. History of gout. 8. COPD. 9.  Hemorrhoids.  ALLERGIES:  None.  SOCIAL HISTORY:  She is a nonsmoker.  Denies alcohol.  No IV drug abuse.  PHYSICAL EXAMINATION:  VITALS:  Temperature 98.6, blood pressure 140/60, pulse 71 and respirations 18. GENERAL:  She is alert and oriented x4.  No apparent distress, cooperative fairly. HEENT:  Extraocular muscles intact.  Pupils equal, reactive to light. Oropharynx clear.  Mucous membranes moist. NECK:  No JVD.  No carotid bruits. COR:  Regular rate and rhythm without murmurs, rubs or gallops. CHEST:  Clear to auscultation bilaterally.  No wheezes, rubs or thrills. ABDOMEN:  Soft, nontender and nondistended.  Positive bowel sounds.  No hepatosplenomegaly. EXTREMITIES:  No clubbing, no edema.  She has got a necrotic fifth left small toe.  There is no purulent drainage with the toenail absent. Also, her third left toe, she lost that toenail also over a week ago and that toe appears to be well healing.  She has got decreased pulses in the left lower extremity and the right lower extremity, but more so in the left.  Her capillary refill is decreased in  the left lower extremity. PSYCH:  Normal affect. NEUROLOGIC:  No focal neurological deficits. SKIN:  No rashes, other than the ischemic toe.  LABORATORY DATA:  White count is 8.3, hemoglobin is 11.4, creatinine is 8.2 which is around her baseline, BUN is 24.  Sodium is 137, potassium is 4.1.  X-ray of the foot shows no definite evidence of osteo.  ASSESSMENT AND PLAN:  This is a 77 year old female with left dry gangrene/ischemic fifth small toe. 1. Left fifth toe gangrene/ischemia.  I am going to place her on a     heparin drip.  The emergency department has already called     orthopedic surgery.  She will probably need a amputation of this     foot and will likely get vascular surgery also involved. 2. End-stage renal disease on dialysis.  She receives dialysis on     Monday, Wednesday, Friday.  We will notify the dialysis  unit that     she has been admitted. 3. Diabetes, place on sliding scale insulin for now. 4. Clarify her home medications as she has no idea what she is taking.     Her granddaughter's are going to bring in her pill bottles. 5. The patient is full code. 6. We will empirically place on vancomycin and Zosyn.  We will also     obtain a wound care consult.  The patient is full code.  Further recommendation pending on overall hospital course.          ______________________________ Derrill Kay, MD     RD/MEDQ  D:  06/02/2011  T:  06/03/2011  Job:  [id number]  Electronically Signed by Derrill Kay MD on 06/05/2011 12:10:40 PM

## 2011-06-06 ENCOUNTER — Inpatient Hospital Stay (HOSPITAL_COMMUNITY): Payer: Medicare Other

## 2011-06-06 LAB — CBC
MCH: 31.4 pg (ref 26.0–34.0)
MCHC: 32.3 g/dL (ref 30.0–36.0)
Platelets: 243 10*3/uL (ref 150–400)
RBC: 3.06 MIL/uL — ABNORMAL LOW (ref 3.87–5.11)
RDW: 14.9 % (ref 11.5–15.5)

## 2011-06-06 LAB — RENAL FUNCTION PANEL
BUN: 28 mg/dL — ABNORMAL HIGH (ref 6–23)
Calcium: 9.7 mg/dL (ref 8.4–10.5)
Creatinine, Ser: 9.89 mg/dL — ABNORMAL HIGH (ref 0.4–1.2)
Glucose, Bld: 124 mg/dL — ABNORMAL HIGH (ref 70–99)
Phosphorus: 6.3 mg/dL — ABNORMAL HIGH (ref 2.3–4.6)

## 2011-06-06 LAB — SURGICAL PCR SCREEN: Staphylococcus aureus: NEGATIVE

## 2011-06-07 ENCOUNTER — Inpatient Hospital Stay (HOSPITAL_COMMUNITY): Payer: Medicare Other

## 2011-06-07 DIAGNOSIS — T82898A Other specified complication of vascular prosthetic devices, implants and grafts, initial encounter: Secondary | ICD-10-CM

## 2011-06-07 DIAGNOSIS — I120 Hypertensive chronic kidney disease with stage 5 chronic kidney disease or end stage renal disease: Secondary | ICD-10-CM

## 2011-06-07 DIAGNOSIS — N186 End stage renal disease: Secondary | ICD-10-CM

## 2011-06-07 LAB — POCT I-STAT 4, (NA,K, GLUC, HGB,HCT)
HCT: 36 % (ref 36.0–46.0)
Hemoglobin: 12.2 g/dL (ref 12.0–15.0)

## 2011-06-07 LAB — GLUCOSE, CAPILLARY: Glucose-Capillary: 139 mg/dL — ABNORMAL HIGH (ref 70–99)

## 2011-06-08 ENCOUNTER — Inpatient Hospital Stay (HOSPITAL_COMMUNITY): Payer: Medicare Other

## 2011-06-08 DIAGNOSIS — I96 Gangrene, not elsewhere classified: Secondary | ICD-10-CM

## 2011-06-08 LAB — RENAL FUNCTION PANEL
CO2: 24 mEq/L (ref 19–32)
GFR calc Af Amer: 5 mL/min — ABNORMAL LOW (ref >60)
GFR calc non Af Amer: 4 mL/min — ABNORMAL LOW (ref >60)
Glucose, Bld: 157 mg/dL — ABNORMAL HIGH (ref 70–99)
Phosphorus: 6.5 mg/dL — ABNORMAL HIGH (ref 2.3–4.6)
Potassium: 3.8 mEq/L (ref 3.5–5.1)
Sodium: 133 mEq/L — ABNORMAL LOW (ref 135–145)

## 2011-06-08 LAB — CBC
Hemoglobin: 8.5 g/dL — ABNORMAL LOW (ref 12.0–15.0)
MCHC: 32.6 g/dL (ref 30.0–36.0)
Platelets: 226 10*3/uL (ref 150–400)
RBC: 2.66 MIL/uL — ABNORMAL LOW (ref 3.87–5.11)

## 2011-06-09 ENCOUNTER — Inpatient Hospital Stay (HOSPITAL_COMMUNITY): Payer: Medicare Other

## 2011-06-09 DIAGNOSIS — I70269 Atherosclerosis of native arteries of extremities with gangrene, unspecified extremity: Secondary | ICD-10-CM

## 2011-06-09 DIAGNOSIS — Z01810 Encounter for preprocedural cardiovascular examination: Secondary | ICD-10-CM

## 2011-06-09 LAB — RENAL FUNCTION PANEL
Albumin: 2.5 g/dL — ABNORMAL LOW (ref 3.5–5.2)
CO2: 27 mEq/L (ref 19–32)
Chloride: 99 mEq/L (ref 96–112)
GFR calc non Af Amer: 6 mL/min — ABNORMAL LOW (ref >60)
Potassium: 4.2 mEq/L (ref 3.5–5.1)

## 2011-06-09 LAB — CBC
HCT: 20.4 % — ABNORMAL LOW (ref 36.0–46.0)
Platelets: 195 10*3/uL (ref 150–400)
RBC: 2.09 MIL/uL — ABNORMAL LOW (ref 3.87–5.11)
RDW: 14.3 % (ref 11.5–15.5)
WBC: 6.5 10*3/uL (ref 4.0–10.5)

## 2011-06-10 LAB — CBC
HCT: 28 % — ABNORMAL LOW (ref 36.0–46.0)
MCH: 30.8 pg (ref 26.0–34.0)
MCHC: 33.6 g/dL (ref 30.0–36.0)
MCV: 91.8 fL (ref 78.0–100.0)
RDW: 16.3 % — ABNORMAL HIGH (ref 11.5–15.5)

## 2011-06-10 LAB — CROSSMATCH
ABO/RH(D): A POS
Antibody Screen: NEGATIVE
Unit division: 0

## 2011-06-10 LAB — BASIC METABOLIC PANEL
BUN: 18 mg/dL (ref 6–23)
Chloride: 96 mEq/L (ref 96–112)
Creatinine, Ser: 4.87 mg/dL — ABNORMAL HIGH (ref 0.4–1.2)
GFR calc Af Amer: 11 mL/min — ABNORMAL LOW (ref >60)

## 2011-06-11 ENCOUNTER — Inpatient Hospital Stay (HOSPITAL_COMMUNITY): Payer: Medicare Other

## 2011-06-11 DIAGNOSIS — Z01810 Encounter for preprocedural cardiovascular examination: Secondary | ICD-10-CM

## 2011-06-11 DIAGNOSIS — I739 Peripheral vascular disease, unspecified: Secondary | ICD-10-CM

## 2011-06-11 LAB — CBC
HCT: 27.3 % — ABNORMAL LOW (ref 36.0–46.0)
MCHC: 33 g/dL (ref 30.0–36.0)
RDW: 15.3 % (ref 11.5–15.5)

## 2011-06-12 LAB — CBC
MCH: 31.1 pg (ref 26.0–34.0)
MCHC: 32.6 g/dL (ref 30.0–36.0)
Platelets: 231 10*3/uL (ref 150–400)
RBC: 2.96 MIL/uL — ABNORMAL LOW (ref 3.87–5.11)

## 2011-06-12 LAB — RENAL FUNCTION PANEL
Albumin: 2.5 g/dL — ABNORMAL LOW (ref 3.5–5.2)
CO2: 27 mEq/L (ref 19–32)
Calcium: 9.2 mg/dL (ref 8.4–10.5)
GFR calc Af Amer: 12 mL/min — ABNORMAL LOW (ref >60)
GFR calc non Af Amer: 10 mL/min — ABNORMAL LOW (ref >60)
Phosphorus: 2.6 mg/dL (ref 2.3–4.6)
Sodium: 136 mEq/L (ref 135–145)

## 2011-06-12 LAB — GLUCOSE, CAPILLARY: Glucose-Capillary: 138 mg/dL — ABNORMAL HIGH (ref 70–99)

## 2011-06-13 ENCOUNTER — Inpatient Hospital Stay (HOSPITAL_COMMUNITY): Payer: Medicare Other

## 2011-06-13 LAB — CBC
HCT: 26.2 % — ABNORMAL LOW (ref 36.0–46.0)
Hemoglobin: 8.6 g/dL — ABNORMAL LOW (ref 12.0–15.0)
RBC: 2.72 MIL/uL — ABNORMAL LOW (ref 3.87–5.11)
WBC: 6.5 10*3/uL (ref 4.0–10.5)

## 2011-06-13 LAB — RENAL FUNCTION PANEL
BUN: 27 mg/dL — ABNORMAL HIGH (ref 6–23)
Chloride: 98 mEq/L (ref 96–112)
Glucose, Bld: 155 mg/dL — ABNORMAL HIGH (ref 70–99)
Potassium: 3.2 mEq/L — ABNORMAL LOW (ref 3.5–5.1)

## 2011-06-13 LAB — GLUCOSE, CAPILLARY: Glucose-Capillary: 138 mg/dL — ABNORMAL HIGH (ref 70–99)

## 2011-06-13 LAB — IRON AND TIBC
Iron: 53 ug/dL (ref 42–135)
TIBC: 161 ug/dL — ABNORMAL LOW (ref 250–470)
UIBC: 108 ug/dL

## 2011-06-14 ENCOUNTER — Inpatient Hospital Stay (HOSPITAL_COMMUNITY): Payer: Medicare Other

## 2011-06-14 DIAGNOSIS — I70219 Atherosclerosis of native arteries of extremities with intermittent claudication, unspecified extremity: Secondary | ICD-10-CM

## 2011-06-14 HISTORY — PX: PR VEIN BYPASS GRAFT,AORTO-FEM-POP: 35551

## 2011-06-14 LAB — RENAL FUNCTION PANEL
BUN: 25 mg/dL — ABNORMAL HIGH (ref 6–23)
CO2: 26 mEq/L (ref 19–32)
CO2: 24 mEq/L (ref 19–32)
Calcium: 9.5 mg/dL (ref 8.4–10.5)
Chloride: 98 mEq/L (ref 96–112)
Chloride: 99 mEq/L (ref 96–112)
Creatinine, Ser: 5.14 mg/dL — ABNORMAL HIGH (ref 0.4–1.2)
GFR calc Af Amer: 8 mL/min — ABNORMAL LOW (ref >60)
Glucose, Bld: 104 mg/dL — ABNORMAL HIGH (ref 70–99)
Glucose, Bld: 161 mg/dL — ABNORMAL HIGH (ref 70–99)
Phosphorus: 4.7 mg/dL — ABNORMAL HIGH (ref 2.3–4.6)
Potassium: 4.2 mEq/L (ref 3.5–5.1)
Sodium: 137 mEq/L (ref 135–145)

## 2011-06-14 LAB — GLUCOSE, CAPILLARY
Glucose-Capillary: 95 mg/dL (ref 70–99)
Glucose-Capillary: 147 mg/dL — ABNORMAL HIGH (ref 70–99)
Glucose-Capillary: 131 mg/dL — ABNORMAL HIGH (ref 70–99)

## 2011-06-14 LAB — SURGICAL PCR SCREEN
MRSA, PCR: NEGATIVE
Staphylococcus aureus: NEGATIVE

## 2011-06-14 LAB — CBC
HCT: 27.8 % — ABNORMAL LOW (ref 36.0–46.0)
Hemoglobin: 9 g/dL — ABNORMAL LOW (ref 12.0–15.0)
MCH: 31.5 pg (ref 26.0–34.0)
MCV: 97.2 fL (ref 78.0–100.0)
Platelets: 262 10*3/uL (ref 150–400)
RBC: 2.86 MIL/uL — ABNORMAL LOW (ref 3.87–5.11)

## 2011-06-14 NOTE — Group Therapy Note (Signed)
[name], [name]  MEDICAL RECORD NO.:  [id number]  LOCATION:  [id number]                         FACILITY:  [facility]  PHYSICIAN:  Oren Binet, MD    DATE OF BIRTH:  24-May-1934                                PROGRESS NOTE   PRIMARY CARE PRACTITIONER: [name], MD  PRIMARY CARDIOLOGIST: Lauree Chandler, MD of [facility].  This progress note covers the patient's hospital course from June 6 to June 12.  CURRENT MEDICAL ISSUES: 1. Left fifth toe gangrene. 2. Status post excision of the left thigh AV graft by Dr. [name]     on June 07, 2011. 3. History of severe peripheral vascular disease. 4. Postoperative anemia now stable with underlying anemia, end-stage     renal disease. 5. Hypertension currently controlled. 6. End-stage renal disease. 7. Secondary hyperparathyroidism. 8. Dyslipidemia. 9. History of palpitations, currently stable. 10.History of mild mitral stenosis. 11.History of aortic valve sclerosis.  CONSULTANTS ON THE CASE: 1. [facility]. 2. [facility]. 3. Dr. Ron Parker from [facility].  BRIEF HISTORY OF PRESENT ILLNESS: The patient is an unfortunate 77 year old diabetic African American female with history of end-stage renal disease on hemodialysis was brought to the ED on June 2 for left fifth toe gangrene.  Because of her complicated medical issues, she was admitted to the Hospitalist Service for further evaluation.  For further details, please see the history and physical that was dictated by Dr. Shanon [name] on admission.  SUBJECTIVE: The patient doing well at baseline, having some mild ongoing pain in the left lower extremity.  PHYSICAL EXAMINATION: VITAL SIGNS:  Afebrile, heart rate of 91, blood pressure of 196/61, pulse ox of 93% on room air. CHEST:  Bilaterally clear to auscultation. CARDIOVASCULAR:  Heart sounds irregular.  No murmurs heard. ABDOMEN:  Soft,  nontender, nondistended. EXTREMITIES:  Left thigh staples are in place.  The wound looks clean without any discharge.  Left fifth toe has frank right gangrene. NEUROLOGIC:  The patient is awake and alert and appears to be nonfocal.  LABORATORY DATA: Labs today show a CBC of 8.1, hemoglobin of 9.2, hematocrit of 28.2, and a platelet count of 231,000.  Chemistries today show a sodium of 136, potassium 3.2, chloride of 98, bicarb of 27, glucose of 149, BUN of 12, creatinine of 4.24, calcium of 9.2 with a phosphorus of 2.6.  PROCEDURES PERFORMED DURING THE HOSPITAL COURSE: 1. The patient underwent a right common femoral artery cannulation     under ultrasound guidance with third order arterial selection     aortogram and the left leg angiogram.  The findings showed patent     aorta with extensive calcification.  Patent bilateral common iliac     artery, external iliac artery, and internal iliac artery.  Patent     common femoral artery as well.  The left iliac stent was patent.     Patent left SFA and profunda artery with obvious steal was evident.     There was a disease left superficial femoral artery.     Reconstitution of the disease left above the knee popliteal. 2. The patient then underwent excision  of the left thigh AV graft,     excision of lymphocele, placement of a right femoral tunneled     dialysis catheter, right femoral vein cannulation under ultrasound     guidance on June 7.  BRIEF HOSPITAL COURSE: 1. Left fifth toe gangrene with underlying left extremity vascular     disease.  The patient was admitted to the Hospitalist Service,     started on vancomycin and Zosyn.  Vancomycin has been discontinued.     The patient currently is still on Zosyn.  [facility]     [facility] was then consulted and the patient was seen in     consultation with Dr. Bridgett Larsson who subsequently took the patient for a     left leg angiogram, the results of which are noted as above.  Since      there was evidence of steal by the graft, it was felt that the     graft needed to be excised to see if that would improve     circulation.  It is also felt that she would definitely need her     left fifth toe amputated.  Following excision of the graft, the     patient underwent ultrasound duplex and ABIs of her lower     extremity.  Current plans are to attempt the salvage the limb by     doing a fem-pop bypass this coming Thursday.  The patient has been     seen preoperatively by [facility] and deemed to be     currently stable for fem-pop bypass.  She will also require an     amputation of her left fifth toe.  It is unclear to me whether this     will happen in the same setting of the bypass. 2. Anemia.  This was acute on chronic.  The acute component was     probably secondary to blood loss from her operative course.  She     was transfused 2 units of PRBC and her hemoglobin is now currently     back to her baseline.  She is getting darbepoetin during dialysis     as well. 3. End-stage renal disease on hemodialysis.  This patient is being     followed by [facility] and is being dialyzed per her     regular schedule. 4. Hypertension.  This is controlled with clonidine, losartan, and     metoprolol. 5. Dyslipidemia.  This is stable. 6. History of underlying peripheral vascular disease.  This is per [facility]     but please see discussion above.  DISPOSITION: The patient will need to be evaluated by Physical Therapy after surgery to see if she requires SNF placement or she can go home with [place].  Further hospital course, discharge summary, discharge medications will be dictated by the discharging physician.     Oren Binet, MD     SG/MEDQ  D:  06/12/2011  T:  06/12/2011  Job:  [id number]  cc:   [name], M.D. Fax: [phone]  Elzie Rings Lorrene Reid, M.D. Fax: [phone]  Electronically Signed by Oren Binet  on 06/14/2011 08:01:07  PM

## 2011-06-15 ENCOUNTER — Inpatient Hospital Stay (HOSPITAL_COMMUNITY): Payer: Medicare Other

## 2011-06-15 DIAGNOSIS — I739 Peripheral vascular disease, unspecified: Secondary | ICD-10-CM

## 2011-06-15 DIAGNOSIS — R072 Precordial pain: Secondary | ICD-10-CM

## 2011-06-15 DIAGNOSIS — R5381 Other malaise: Secondary | ICD-10-CM

## 2011-06-15 LAB — BASIC METABOLIC PANEL
CO2: 25 mEq/L (ref 19–32)
Chloride: 93 mEq/L — ABNORMAL LOW (ref 96–112)
Creatinine, Ser: 8.15 mg/dL — ABNORMAL HIGH (ref 0.50–1.10)
Sodium: 130 mEq/L — ABNORMAL LOW (ref 135–145)

## 2011-06-15 LAB — CARDIAC PANEL(CRET KIN+CKTOT+MB+TROPI)
CK, MB: 2.4 ng/mL (ref 0.3–4.0)
Relative Index: 2.3 (ref 0.0–2.5)
Total CK: 104 U/L (ref 7–177)

## 2011-06-15 LAB — CBC
MCV: 97.4 fL (ref 78.0–100.0)
Platelets: 333 10*3/uL (ref 150–400)
RBC: 2.35 MIL/uL — ABNORMAL LOW (ref 3.87–5.11)
WBC: 13.3 10*3/uL — ABNORMAL HIGH (ref 4.0–10.5)

## 2011-06-15 LAB — GLUCOSE, CAPILLARY
Glucose-Capillary: 140 mg/dL — ABNORMAL HIGH (ref 70–99)
Glucose-Capillary: 143 mg/dL — ABNORMAL HIGH (ref 70–99)

## 2011-06-16 DIAGNOSIS — I70269 Atherosclerosis of native arteries of extremities with gangrene, unspecified extremity: Secondary | ICD-10-CM

## 2011-06-16 LAB — GLUCOSE, CAPILLARY: Glucose-Capillary: 154 mg/dL — ABNORMAL HIGH (ref 70–99)

## 2011-06-17 LAB — TYPE AND SCREEN
ABO/RH(D): A POS
Unit division: 0

## 2011-06-17 LAB — GLUCOSE, CAPILLARY
Glucose-Capillary: 154 mg/dL — ABNORMAL HIGH (ref 70–99)
Glucose-Capillary: 147 mg/dL — ABNORMAL HIGH (ref 70–99)

## 2011-06-17 NOTE — Op Note (Signed)
[name], [name]  MEDICAL RECORD NO.:  [id number]  LOCATION:  [id number]                         FACILITY:  [facility]  PHYSICIAN:  Conrad Clarksdale, MD       DATE OF BIRTH:  [birth year]  DATE OF PROCEDURE:  06/07/2011 DATE OF DISCHARGE:                              OPERATIVE REPORT   PROCEDURES:  Excision left thigh arteriovenous graft, excision of lymphocele, placement of right femoral vein tunneled dialysis catheter, right femoral vein cannulation under ultrasound guidance.  SURGEON:  Conrad Raymond, MD  ASSISTANT:  Gae Gallop, MD, Leta Baptist, PA-C  ANESTHESIA:  General.  FINDINGS IN THIS CASE:  Included 1. Labile blood pressure intraoperatively. 2. Lymphocele encompassing the origins of this graft.  Subsequent due     to the lymphocele, anatomy was distorted and I could not completely     define the native anatomy.  Subsequently, a small rim graft was     left above the vein and artery. 3. Clinical evidence of steal intraoperatively as there was no     palpable pulse in the native artery system without clamping the     graft.  SPECIMENS:  None.  ESTIMATED BLOOD LOSS:  About 200 mL.  COMPLICATIONS:  None.  CONDITION:  Stable.  INDICATIONS:  This is a 77 year old patient who presented with a dry gangrene of the fifth toe, with also ischemia of her foot.  She had a functional left thigh graft.  Based on her evaluation, it was felt that she needed the angiogram to delineate the nature of her disease in this left leg.  The angiogram was completed.  This demonstrated severe steal in her right and her left leg and then also on top of native arterial disease.  She was given option of consideration of below-the-knee versus above-the-knee amputation and continuation with graft versus ligation of graft.  She elected to proceed forward with ligation of the graft.  We discussed the risks of this procedure include bleeding,  infection, possible need for additional procedures such as a femoral popliteal bypass.  Also, we discussed the risks of placement of a tunneled dialysis catheter which was necessary as her only access this left thigh graft would be ligated as part of this procedure.  She is aware of the risks of this procedure include bleeding, infection, possible central venous injury, and possible pneumothorax.  She was aware of these risks and agreed to proceed forward with both procedures.  DESCRIPTION OF PROCEDURE:  After full informed written consent was obtained from the patient, she was brought back to the operating room, placed supine upon the operating table.  Prior to induction, she had already received therapeutic dose of vancomycin.  She was then prepped and draped in a standard fashion after obtaining adequate anesthesia. Turned my attention to the left groin.  I made a longitudinal incision over the graft and then I dissected down through the tissue down to the level of the graft.  When dissecting out the two limbs in the graft, I found something unusual structure involving the venous and also the arterial anastomosis.  I asked Dr. Scot Dock to scrub in the case  to look at that this.  He also looked at it and felt this most likely a lymphocele.  We made an incision on the lymphocele and this confirmed the findings.  Immediately upon entry into the lymphocele, there was this mucinous material which was easily debrided and suctioned out without any problems.  The grafts remained intact within the lymphocele; however, the lymphocele extended down to the origins of this anastomosis involving both the common femoral artery and also the common femoral vein at the anastomosis.  Subsequently, the anatomy was poorly delineated.  I attempted to diligently dissect this out, but we began inadvertently entering into the artery and the vein and could not clearly delineate the anatomy.  Subsequently I felt  it was not safe to try to dissect out the artery and vein due to the presence of this lymphocele, so I clamped each limb of the graft, first arterial and then cut off a small rim of graft.  This was closed with a double layer stitch of 5-0 Prolene in a running fashion, and in similar fashion the venous arm of this graft was clamped and a small rim of graft left in oversewn with a 5-0 Prolene.  I then dissected out the rest of this graft.  It required multiple counterincisions.  On the arterial arm, it required 2 incisions, on the venous arm required 3 incisions.  One portion at the arterial arm also required resection of a portion of the skin as the graft was densely adherent to the skin at this level. Eventually, we were able to remove all of the graft in the thigh and only a small rim of graft was present on the artery and vein in the left groin.  We then irrigated out all wounds, packed them, and then placed thrombin and Gelfoam.  After three cycles of this action, eventually bleeding was controlled.  Due to the bleeding present in the thigh, I felt that this leg was best managed with staples to allow any drainage. Subsequently, the skin was reapproximated in the thigh with staples except that the portion of skin that had been opened.  This portion of the subcutaneous tissue was reapproximated with a running stitch of 3-0 Vicryl prior to stapling the skin together.  The groin was repaired with a double layer of 2-0 Vicryl and a double layer of 3-0 Vicryl.  The skin was then closed with running subcuticular 4-0 Monocryl and then the Dermabond applied to reinforce the skin closure.  The patient tolerated this procedure acceptably.    At this point, we took down all the drapes and proceeded to second part of  this operation, the placement of the dialysis catheter.  I looked at her neck first.   There was absolutely no internal jugular veins identified.  I looked at her subclavian  veins,  and there also had some concerns whether or not there was patency. This was a difficult subclavian vein exam.  In her chest, there was already evidence of the chest wall collaterals, so I had a suspicion she may have degree of stenosis bilaterally, so unfortunately I did not feel that it was going to be successful placing a neck or chest tunneled dialysis catheter, so I went to the right groin where I did identify a patent common femoral vein, though it was small.  I was able after three cannulation two previous of which I was able to cannulate the vein, but the wire would not pass, so I was eventually able  to get into the common femoral vein and passed the wire centrally under fluoroscopic guidance up to the level of the heart.  I made stab incisions at the cannulation site and a little bit laterally on the thigh at the side of the exit site for the catheter, then I dissected from the lateral incision to the groin incision and then dilated this up with plastic dilator over the metal tunneler.  I then at this point took out the needle and then dilated the track up with serial dilators and then placed the dilator sheath over the wire up into the right common femoral vein.  I then loaded the 55-cm Diatek catheter through this sheath up into the right atrium which was verified under fluoroscopic guidance.  I then connected the back end of this catheter to the metal tunneler and passed the catheter in retrograde fashion through the subcutaneous tunnel, pulled it to appropriate length, and then transected back in.  This catheter revealing 2 lumens of this catheter.  The 2 ports were docked under these two lumens.  I then screwed on the catheter hub and then tested each port with 3 mL syringe.  There was no resistance to aspiration or flush.  I then loaded each port was heparinized saline, checked the positioning of this catheter.  It was noted be in the right atrium.  At this point, I secured  the catheter in place with two stitches of nylon tied to the catheter, then the groin incision was closed with a U-stitch of 4-0 Monocryl.  Sterile dressings were then applied after the skin was cleaned and then each port was loaded with concentrated heparin at 1000 unit/mL at the manufacturer-recommended volumes.  The patient tolerated this procedure also well without any complications, and her condition was stable.     Conrad Scott, MD     BLC/MEDQ  D:  06/07/2011  T:  06/08/2011  Job:  [id number]  Electronically Signed by [name] MD on 06/17/2011 12:26:44 PM

## 2011-06-17 NOTE — Op Note (Signed)
[name], [name]  MEDICAL RECORD NO.:  [id number]  LOCATION:                                 FACILITY:  PHYSICIAN:  Conrad Wanaque, MD       DATE OF BIRTH:  [birth year]  DATE OF PROCEDURE:  06/04/2011 DATE OF DISCHARGE:                              OPERATIVE REPORT   PROCEDURE: 1. Right common femoral artery cannulation under ultrasound guidance. 2. Third order arterial selection. 3. Aortogram. 4. Left leg angiogram.  PREOPERATIVE DIAGNOSES:  Left leg gangrene.  POSTOPERATIVE DIAGNOSIS:  Left leg gangrene.  SURGEON:  Aaron Edelman L. Bridgett Larsson, MD  ANESTHESIA:  Conscious sedation.  ESTIMATED BLOOD LOSS:  Minimal.  CONTRAST:  115 mL.  SPECIMENS:  None.  FINDINGS: 1. Patent aorta with extensive calcification. 2. Patent bilateral common iliac artery, external iliac artery, and     internal iliac artery. 3. Patent common femoral artery. 4. The left iliac stent is still patent. 5. Functional left thigh graft. 6. Patent left SFA and profunda artery with obvious steal evident. 7. There is a diseased left superficial femoral artery that is 3 mm at     its biggest diameter. 8. Reconstitution of diseased left above-the-knee popliteal. 9. Small below-the-knee popliteal artery about 3 mm. 10.Patent trifurcation. 11.Minuscule PT and peroneal arteries which occluded in the proximal     calf. 12.The left anterior tibial artery continues as the runoff to the left     foot which is minuscule.  INDICATIONS:  This is a 77 year old patient that comes in with a known previous history of a left thigh graft.  There was some concern of possible steal syndrome; however, on the duplex compression studies this did not appear to be evident.  She since then has developed left fifth toe gangrene and also pain throughout this left foot even at rest. Based on my examination, I felt that a repeat angiogram was going to be necessary to try to interrogate this left  limb.  Based on the previous studies, she had previously known femoral popliteal and tibial disease, so there was some concern of whether or not this was going to be any possibility of salvage, but without the additional angiographic studies I did not feel that I could make a final decision.  She is aware of the risks of the procedure include bleeding, infection, possible access complications, possible need for additional procedures, and possibility of embolization or rupture of any treated vessels.  DESCRIPTION OF PROCEDURE:  After full informed written consent was obtained from the patient, she was brought back to angio suite and placed supine upon angio table.  She was connected to monitored equipment prior to giving conscious sedation, amounts of which are documented in her chart which she was then prepped and draped in standard fashion for aortogram, bilateral leg runoff.  I turned my attention to her right groin.  Under ultrasound guidance, I cannulated her right common femoral artery and passed a Bentson wire up into the aorta.  The needle was exchanged for a 5-French sheath.  We then placed an Omni flush catheter over the wire up into the aorta.  The catheter was  connected to the power injector circuit after performing declotting and de-airing maneuver.  The power injector aortogram was as noted above.  I then pulled down the flush and with the help of a Bentson wire selected out the left common iliac artery.  I was able to advance the catheter and wire down to the level of the external iliac artery and did a selective injection at this site.  This demonstrated steal; however, due to the severity of the steal I could not fully image the distal femoral arteries, so I felt selection of the SFA was going to be necessary.  I was able to after multiple RAO and LAO obliques identify an angulation that would allow me to identify the native common femoral artery.  I was then using a  Glidewire and then a KMP catheter able to select out the left SFA and was able to lodge the catheter into the SFA. We then did a left leg angiogram in stations, the findings of which are listed as above.  At this point, I did not think from an endovascular viewpoint there was anything that could be done, so I pulled back the catheter after putting a Bentson wire back in, pulled it back into the aorta and then pulled out this catheter and wire, aspirated out the right sheath.  There was no clots and then instilled heparinized saline. The plan is to pull the sheath in the holding area and then a 4-hour rest to watch for any access complications.  In this patient unfortunately I think with retaining the graft there is really no bypass option that would be feasible as there is significant enough steal here that any bypass from the common femoral to the distal tibials would likely fail so if she wants to keep her access graft I think she will have to consider possibility of either a BKA or an AKA; however, with the graft present I still have concerns whether or not there will be adequate blood flow to heal a BKA or AKA.  Alternative is if she is willing to consider ligation of the graft and placement of a tunneled dialysis catheter, we could at that point see if the additional blood flow was adequate to alleviate her rest pain.  The fifth toe will unfortunately have to be amputated as this is frankly gangrenous.  Also, ligating this access does not guarantee that she will not need a bypass. Unfortunately, these are two unpleasant decisions and I have discussed this with the family and the patient and they are going to consider which option they want to consider proceeding with.     Conrad Diaz, MD     BLC/MEDQ  D:  06/04/2011  T:  06/05/2011  Job:  [id number]  Electronically Signed by [name] MD on 06/17/2011 12:23:48 PM

## 2011-06-18 ENCOUNTER — Inpatient Hospital Stay (HOSPITAL_COMMUNITY): Payer: Medicare Other

## 2011-06-18 LAB — GLUCOSE, CAPILLARY
Glucose-Capillary: 126 mg/dL — ABNORMAL HIGH (ref 70–99)
Glucose-Capillary: 118 mg/dL — ABNORMAL HIGH (ref 70–99)

## 2011-06-18 LAB — BASIC METABOLIC PANEL
CO2: 24 mEq/L (ref 19–32)
Calcium: 9.8 mg/dL (ref 8.4–10.5)
Chloride: 93 mEq/L — ABNORMAL LOW (ref 96–112)
GFR calc Af Amer: 5 mL/min — ABNORMAL LOW (ref >60)
Sodium: 133 mEq/L — ABNORMAL LOW (ref 135–145)

## 2011-06-18 LAB — PHOSPHORUS: Phosphorus: 5 mg/dL — ABNORMAL HIGH (ref 2.3–4.6)

## 2011-06-18 LAB — CBC
Hemoglobin: 7.2 g/dL — ABNORMAL LOW (ref 12.0–15.0)
MCH: 32.4 pg (ref 26.0–34.0)
MCHC: 32.9 g/dL (ref 30.0–36.0)
Platelets: 341 10*3/uL (ref 150–400)

## 2011-06-18 LAB — CROSSMATCH: Unit division: 0

## 2011-06-18 LAB — MAGNESIUM: Magnesium: 2.4 mg/dL (ref 1.5–2.5)

## 2011-06-18 LAB — DIFFERENTIAL
Basophils Relative: 1 % (ref 0–1)
Eosinophils Absolute: 0.8 10*3/uL — ABNORMAL HIGH (ref 0.0–0.7)
Monocytes Absolute: 1 10*3/uL (ref 0.1–1.0)
Monocytes Relative: 8 % (ref 3–12)

## 2011-06-19 ENCOUNTER — Inpatient Hospital Stay (HOSPITAL_COMMUNITY): Payer: Medicare Other

## 2011-06-19 LAB — CBC
HCT: 27 % — ABNORMAL LOW (ref 36.0–46.0)
Hemoglobin: 8.8 g/dL — ABNORMAL LOW (ref 12.0–15.0)
MCH: 31.5 pg (ref 26.0–34.0)
MCHC: 32.6 g/dL (ref 30.0–36.0)
MCV: 96.8 fL (ref 78.0–100.0)
RDW: 19 % — ABNORMAL HIGH (ref 11.5–15.5)

## 2011-06-19 LAB — BASIC METABOLIC PANEL
BUN: 28 mg/dL — ABNORMAL HIGH (ref 6–23)
Creatinine, Ser: 5.13 mg/dL — ABNORMAL HIGH (ref 0.50–1.10)
GFR calc Af Amer: 10 mL/min — ABNORMAL LOW (ref >60)
GFR calc non Af Amer: 8 mL/min — ABNORMAL LOW (ref >60)
Glucose, Bld: 125 mg/dL — ABNORMAL HIGH (ref 70–99)
Potassium: 4 mEq/L (ref 3.5–5.1)

## 2011-06-19 LAB — GLUCOSE, CAPILLARY
Glucose-Capillary: 111 mg/dL — ABNORMAL HIGH (ref 70–99)
Glucose-Capillary: 113 mg/dL — ABNORMAL HIGH (ref 70–99)

## 2011-06-19 LAB — CROSSMATCH
ABO/RH(D): A POS
Antibody Screen: NEGATIVE

## 2011-06-19 MED ORDER — IOHEXOL 300 MG/ML  SOLN
50.0000 mL | Freq: Once | INTRAMUSCULAR | Status: AC | PRN
  Administered 2011-06-19: 40 mL via INTRAVENOUS

## 2011-06-19 NOTE — Consult Note (Signed)
[name], [name]  MEDICAL RECORD NO.:  [id number]  LOCATION:  [id number]                         FACILITY:  [facility]  PHYSICIAN:  Carlena Bjornstad, MD, FACCDATE OF BIRTH:  02-Sep-1934  DATE OF CONSULTATION: DATE OF DISCHARGE:                                CONSULTATION   HISTORY OF PRESENT ILLNESS:  The patient has significant peripheral vascular disease.  She may potentially have surgery for gangrene of the toes.  Cardiology is asked to review her cardiac status and to see if she can be cleared for surgery.  The patient has seen Dr. Angelena Form of our group in the past.  There is normal left ventricular function.  There is no known coronary artery disease, but she has never been cathed.  There is aortic valve sclerosis, but no stenosis.  There is mild mitral stenosis.  The patient saw Dr. Angelena Form last in April 2012 and she was stable.  There is no congestive heart failure.  She has no angina.  There is no recent MI. There is no arrhythmia.  She has limited tolerance because of her vascular disease.  ALLERGIES:  No known drug allergies.  MEDICATIONS:  Currently in the hospital she is on, 1. Aranesp. 2. Aspirin 81. 3. Catapres 0.2 mg b.i.d. 4. Cozaar 100 mg at night. 5. Nephro-Vite. 6. Neurontin. 7. PhosLo. 8. Toprol-XL 50. 9. Zocor 40. 10.Zosyn.  OTHER MEDICAL PROBLEMS:  See the complete list below.  SOCIAL HISTORY:  The patient does not smoke or abuse drugs.  FAMILY HISTORY:  There is no family history of coronary disease.  REVIEW OF SYSTEMS:  The patient denies fever, chills, headache, sweats, rash, change in vision, change in hearing, chest pain, cough, nausea or vomiting, urinary symptoms.  She of course has discomfort from her gangrenous toes.  All other systems are reviewed and are negative.  PHYSICAL EXAMINATION:  VITAL SIGNS:  Blood pressure is 135/81 with a pulse of 82. GENERAL:  There is a family member in the room.  The  patient is oriented to person, time, and place.  Affect is normal. HEENT:  Head is atraumatic.  There is no jugular venous distention. LUNGS:  Clear. CARDIAC:  Reveals S1 and S2.  There are no clicks or significant murmurs. ABDOMEN:  Soft.  She has the gangrenous toes that have been discussed in the chart.  LABORATORY DATA:  EKG done on this admission showed nonspecific ST-T wave changes with normal sinus rhythm.  Chest x-ray shows no evidence of cardiopulmonary disease.  Her last hemoglobin was 9.4.  This is up from 6.6 after her transfusion.  BUN is 18 and creatinine 4.87.  She is on dialysis.  Potassium is 4.2.  PROBLEM LIST: 1. End-stage renal disease, on dialysis. 2. Hypertension, treated. 3. Diabetes, treated. 4. History of palpitations.  She is not having any significant     palpitations at this time. 5. Severe peripheral arterial disease with gangrenous toes. 6. Gangrene of the toes with possible need for surgery. 7. Anemia that has been treated with transfusion. 8. Chronic obstructive pulmonary disease. 9. Aortic valve sclerosis, but no stenosis by echo in the past. 10.Mild mitral stenosis  by echo in the past. 11.Ejection fraction 55-60% by 2-D echo in 2011.  The patient is stable from the cardiac viewpoint.  She is cleared for vascular surgery if the decision is made to proceed.  She needs no further cardiac workup.  She should be followed in the standard fashion post surgery.     Carlena Bjornstad, MD, [facility]     JDK/MEDQ  D:  06/11/2011  T:  06/11/2011  Job:  [id number]  Electronically Signed by Dola Argyle MD FACC on 06/19/2011 10:34:11 AM

## 2011-06-20 ENCOUNTER — Inpatient Hospital Stay (HOSPITAL_COMMUNITY): Payer: Medicare Other

## 2011-06-20 LAB — RENAL FUNCTION PANEL
Albumin: 2.5 g/dL — ABNORMAL LOW (ref 3.5–5.2)
Chloride: 98 mEq/L (ref 96–112)
GFR calc Af Amer: 9 mL/min — ABNORMAL LOW (ref >60)
GFR calc non Af Amer: 7 mL/min — ABNORMAL LOW (ref >60)
Phosphorus: 2.7 mg/dL (ref 2.3–4.6)
Potassium: 3.5 mEq/L (ref 3.5–5.1)
Sodium: 136 mEq/L (ref 135–145)

## 2011-06-20 LAB — CBC
HCT: 27.5 % — ABNORMAL LOW (ref 36.0–46.0)
Hemoglobin: 9 g/dL — ABNORMAL LOW (ref 12.0–15.0)
MCHC: 32.7 g/dL (ref 30.0–36.0)
RDW: 19.5 % — ABNORMAL HIGH (ref 11.5–15.5)
WBC: 10.6 10*3/uL — ABNORMAL HIGH (ref 4.0–10.5)

## 2011-06-20 LAB — GLUCOSE, CAPILLARY
Glucose-Capillary: 118 mg/dL — ABNORMAL HIGH (ref 70–99)
Glucose-Capillary: 130 mg/dL — ABNORMAL HIGH (ref 70–99)
Glucose-Capillary: 158 mg/dL — ABNORMAL HIGH (ref 70–99)
Glucose-Capillary: 177 mg/dL — ABNORMAL HIGH (ref 70–99)
Glucose-Capillary: 190 mg/dL — ABNORMAL HIGH (ref 70–99)
Glucose-Capillary: 93 mg/dL (ref 70–99)

## 2011-06-21 LAB — GLUCOSE, CAPILLARY

## 2011-06-22 ENCOUNTER — Other Ambulatory Visit: Payer: Self-pay | Admitting: Orthopedic Surgery

## 2011-06-22 ENCOUNTER — Inpatient Hospital Stay (HOSPITAL_COMMUNITY): Payer: Medicare Other

## 2011-06-22 HISTORY — PX: FOOT AMPUTATION THROUGH METATARSAL: SHX644

## 2011-06-22 LAB — CBC
HCT: 28.2 % — ABNORMAL LOW (ref 36.0–46.0)
Hemoglobin: 9.5 g/dL — ABNORMAL LOW (ref 12.0–15.0)
MCHC: 33.7 g/dL (ref 30.0–36.0)
RBC: 2.85 MIL/uL — ABNORMAL LOW (ref 3.87–5.11)
WBC: 12.2 10*3/uL — ABNORMAL HIGH (ref 4.0–10.5)

## 2011-06-22 LAB — BASIC METABOLIC PANEL
BUN: 36 mg/dL — ABNORMAL HIGH (ref 6–23)
Chloride: 94 mEq/L — ABNORMAL LOW (ref 96–112)
GFR calc non Af Amer: 6 mL/min — ABNORMAL LOW (ref >60)
Glucose, Bld: 134 mg/dL — ABNORMAL HIGH (ref 70–99)
Potassium: 3.7 mEq/L (ref 3.5–5.1)
Sodium: 133 mEq/L — ABNORMAL LOW (ref 135–145)

## 2011-06-22 LAB — GLUCOSE, CAPILLARY
Glucose-Capillary: 129 mg/dL — ABNORMAL HIGH (ref 70–99)
Glucose-Capillary: 126 mg/dL — ABNORMAL HIGH (ref 70–99)
Glucose-Capillary: 139 mg/dL — ABNORMAL HIGH (ref 70–99)
Glucose-Capillary: 174 mg/dL — ABNORMAL HIGH (ref 70–99)

## 2011-06-22 LAB — PHOSPHORUS: Phosphorus: 5.5 mg/dL — ABNORMAL HIGH (ref 2.3–4.6)

## 2011-06-23 ENCOUNTER — Inpatient Hospital Stay (HOSPITAL_COMMUNITY): Payer: Medicare Other

## 2011-06-23 LAB — CBC
HCT: 27 % — ABNORMAL LOW (ref 36.0–46.0)
MCH: 32.8 pg (ref 26.0–34.0)
MCV: 101.9 fL — ABNORMAL HIGH (ref 78.0–100.0)
Platelets: 261 10*3/uL (ref 150–400)
RBC: 2.65 MIL/uL — ABNORMAL LOW (ref 3.87–5.11)
RDW: 21.1 % — ABNORMAL HIGH (ref 11.5–15.5)
WBC: 15.8 10*3/uL — ABNORMAL HIGH (ref 4.0–10.5)

## 2011-06-23 LAB — BASIC METABOLIC PANEL
BUN: 30 mg/dL — ABNORMAL HIGH (ref 6–23)
CO2: 19 mEq/L (ref 19–32)
Calcium: 9.4 mg/dL (ref 8.4–10.5)
Chloride: 96 mEq/L (ref 96–112)
Creatinine, Ser: 5.61 mg/dL — ABNORMAL HIGH (ref 0.50–1.10)

## 2011-06-23 LAB — GLUCOSE, CAPILLARY

## 2011-06-24 LAB — GLUCOSE, CAPILLARY
Glucose-Capillary: 114 mg/dL — ABNORMAL HIGH (ref 70–99)
Glucose-Capillary: 108 mg/dL — ABNORMAL HIGH (ref 70–99)
Glucose-Capillary: 137 mg/dL — ABNORMAL HIGH (ref 70–99)

## 2011-06-25 ENCOUNTER — Inpatient Hospital Stay (HOSPITAL_COMMUNITY): Payer: Medicare Other

## 2011-06-25 LAB — CBC
MCH: 33.5 pg (ref 26.0–34.0)
MCHC: 33 g/dL (ref 30.0–36.0)
MCV: 101.5 fL — ABNORMAL HIGH (ref 78.0–100.0)
Platelets: 222 10*3/uL (ref 150–400)
RDW: 20.6 % — ABNORMAL HIGH (ref 11.5–15.5)

## 2011-06-25 LAB — GLUCOSE, CAPILLARY: Glucose-Capillary: 111 mg/dL — ABNORMAL HIGH (ref 70–99)

## 2011-06-25 LAB — BASIC METABOLIC PANEL
CO2: 23 mEq/L (ref 19–32)
Calcium: 8.3 mg/dL — ABNORMAL LOW (ref 8.4–10.5)
Potassium: 4 mEq/L (ref 3.5–5.1)
Sodium: 131 mEq/L — ABNORMAL LOW (ref 135–145)

## 2011-06-25 LAB — IRON AND TIBC

## 2011-06-26 ENCOUNTER — Ambulatory Visit: Payer: Medicare Other | Admitting: Vascular Surgery

## 2011-06-26 LAB — GLUCOSE, CAPILLARY
Glucose-Capillary: 159 mg/dL — ABNORMAL HIGH (ref 70–99)
Glucose-Capillary: 131 mg/dL — ABNORMAL HIGH (ref 70–99)
Glucose-Capillary: 160 mg/dL — ABNORMAL HIGH (ref 70–99)

## 2011-06-26 LAB — CROSSMATCH
ABO/RH(D): A POS
Antibody Screen: NEGATIVE
Unit division: 0

## 2011-06-27 ENCOUNTER — Inpatient Hospital Stay (HOSPITAL_COMMUNITY): Payer: Medicare Other

## 2011-06-27 LAB — RENAL FUNCTION PANEL
Calcium: 9.3 mg/dL (ref 8.4–10.5)
GFR calc Af Amer: 6 mL/min — ABNORMAL LOW (ref >60)
GFR calc non Af Amer: 5 mL/min — ABNORMAL LOW (ref >60)
Glucose, Bld: 108 mg/dL — ABNORMAL HIGH (ref 70–99)
Phosphorus: 5.8 mg/dL — ABNORMAL HIGH (ref 2.3–4.6)
Sodium: 134 mEq/L — ABNORMAL LOW (ref 135–145)

## 2011-06-27 LAB — CBC
MCH: 32.3 pg (ref 26.0–34.0)
MCHC: 33.3 g/dL (ref 30.0–36.0)
Platelets: 218 10*3/uL (ref 150–400)

## 2011-06-27 LAB — GLUCOSE, CAPILLARY: Glucose-Capillary: 167 mg/dL — ABNORMAL HIGH (ref 70–99)

## 2011-06-28 LAB — GLUCOSE, CAPILLARY
Glucose-Capillary: 163 mg/dL — ABNORMAL HIGH (ref 70–99)
Glucose-Capillary: 111 mg/dL — ABNORMAL HIGH (ref 70–99)

## 2011-07-01 ENCOUNTER — Emergency Department (HOSPITAL_COMMUNITY)
Admission: EM | Admit: 2011-07-01 | Discharge: 2011-07-01 | Disposition: A | Discharge status: Home / Self Care | Payer: Medicare Other | Attending: Emergency Medicine | Admitting: Emergency Medicine

## 2011-07-01 DIAGNOSIS — I739 Peripheral vascular disease, unspecified: Secondary | ICD-10-CM | POA: Insufficient documentation

## 2011-07-01 DIAGNOSIS — I10 Essential (primary) hypertension: Secondary | ICD-10-CM | POA: Insufficient documentation

## 2011-07-01 DIAGNOSIS — E119 Type 2 diabetes mellitus without complications: Secondary | ICD-10-CM | POA: Insufficient documentation

## 2011-07-01 DIAGNOSIS — S98139A Complete traumatic amputation of one unspecified lesser toe, initial encounter: Secondary | ICD-10-CM | POA: Insufficient documentation

## 2011-07-01 DIAGNOSIS — M79609 Pain in unspecified limb: Secondary | ICD-10-CM | POA: Insufficient documentation

## 2011-07-01 DIAGNOSIS — G8918 Other acute postprocedural pain: Secondary | ICD-10-CM | POA: Insufficient documentation

## 2011-07-05 NOTE — Discharge Summary (Signed)
[name], [name]                ACCOUNT NO.:  [id number]  MEDICAL RECORD NO.:  [id number]  LOCATION:  6715                         FACILITY:  [facility]  PHYSICIAN:  Estill Cotta, MD       DATE OF BIRTH:  Sep 18, 1934  DATE OF ADMISSION:  06/02/2011 DATE OF DISCHARGE:  06/12/2011                              DISCHARGE SUMMARY   PRIMARY CARE PHYSICIAN:  Margaret Baltimore A. Alyson Ingles, MD  DISCHARGE DIAGNOSES: 1. Left fifth toe gangrene, status post left fifth toe and metatarsal     amputation on June 22, 2011. 2. Status post excision of the left thigh AV graft by Dr. Harl Bowie on     June 07, 2011. 3. Ischemia of the left foot with dry gangrene and fem-pop tibial     occlusive disease. 4. Status post left common fem-pop bypass on June 14, 2011. 5. History of severe peripheral vascular disease. 6. Postop anemia. 7. End-stage renal disease, on hemodialysis. 8. Hypertension with episodes of hypotension during her     hospitalization. 9. Secondary hyperparathyroidism. 10.Dyslipidemia. 11.History of mild mitral stenosis and aortic valve sclerosis. 12.History of palpitations. 13.Postop ileus, resolved. 14.Altered mental status, postop resolved. 15.Generalized deconditioning.  CONSULTANTS: 1. [facility]. 2. Vascular surgery. 3. [facility], Dr. Ron Parker. 4. Orthopedics, Dr. Sharol Given. 5. CIR inpatient rehab.  BRIEF HISTORY OF PRESENT ILLNESS:  [name] is a 77 year old diabetic African American female with end-stage renal disease on, hemodialysis presented to the emergency room on June 02, 2011 for the left fifth toe gangrene.  Given her complicated medical issues, she was admitted to the hospital service.  PROCEDURES:  During the hospitalization with radiological data.  Left foot x-rays on June 02, 2011, no definitive evidence of osteomyelitis, some loss of soft tissue along the distal aspect of the left fifth toe. Chest x-ray two-view June 06, 2011, mild cardiac enlargement.   No evidence of active pulmonary disease.  Chest x-ray on June 07, 2011 femoral dialysis catheter tips in the right atrium, mild linear atelectasis in the lung bases, likely progressive since yesterday.  No acute cardiopulmonary disease.  Otherwise, left foot x-ray on June 08, 2011 showed no acute fracture or subluxation.  Left lower extremity arteriography was done on June 14, 2011, bilateral extremity venography on June 19, 2011 showed right x-ray vein, subclavian vein, innominate vein and SVCR widely patent for patent right-sided central venous structures, selected innominate vein and subclavian vein not occlude chronically.  Abdominal x-ray on June 23, 2011, nonobstructive bowel gas pattern.  PROCEDURES:  During the hospitalization, 1. The patient underwent right common femoral artery cannulation under     ultrasound guidance with third order arterial selection of     aortogram and left leg angiogram.  The findings showed patent aorta     with extensive calcification, patent bilateral common iliac artery,     external iliac artery and internal iliac artery, patent common     femoral artery as well left iliac stent was patent, patent left SFA     and profunda artery with obvious feel was evident.  There was a     diseased left superficial femoral artery reconstitution of the  disease, left above the knee popliteal. 2. The patient then underwent excision of the left high AV graft     excision of lymphocele placement of right femoral tunnel dialysis     catheter, right femoral vein cannulation and ultrasound guidance on     June 07, 2011.  On June 14, 2011, the patient underwent left common     femoral to popliteal below knee bypass using 6 mm GORE-TEX propaten     graft with intraoperative arteriogram.  On June 22, 2011 the     patient underwent left fifth toe and metatarsal amputation.  BRIEF HOSPITALIZATION COURSE:  Please also refer to the progress note dictated by Dr. Oren Binet on June 12, 2011. 1. Left fifth toe gangrene with underlying left extremity vascular     disease.  As stated above, the patient was admitted to the     hospitalist service and was started on vancomycin and Zosyn.     Vascular Surgery was consulted and the patient had left leg     angiogram, results as stated above.  There was evidence of steel in     the graft, it was felt that the graft needed to be excised to see     any improvement in the circulation.  Following the excision of the     graft, the patient underwent a duplex ultrasound and ABI of the     lower extremity.  The patient then underwent left common femoral to     popliteal below knee bypass with intraoperative angiogram.     Orthopedics also followed the patient and the patient underwent     left fifth toe and metatarsal amputation by Dr. Meridee Score.  The     patient was seen preoperatively by [facility] and was     deemed to be stable for fem-pop bypass and amputation of her left     fifth toe. 2. Postop ileus with altered mental status.  On June 23, 2011 the     patient appeared to be more lethargic, but arousable with nausea     and vomiting.  Abdominal x-ray was obtained, which showed     nonobstructive bowel gas pattern, ileus.  Nausea and vomiting has     improved.  The patient is currently tolerating the regular diet.     She also did have episodes of hypotension hence medications were     adjusted. 3. Postop anemia with anemia of chronic disease.  On June 25, 2011,     the patient was noted to have hemoglobin of 6.5 and received 3     units of packed RBC transfusion. 4. End-stage renal disease.  The patient is currently on hemodialysis.     There was a question of future permanent dialysis access; however,     per Renal Service, Dr. Kellie Simmering had discussed options for permanent     access on June 22, 2011.  However, the patient was not interested     in further axis at this time. 5. Hypertension is  currently controlled. 6. Dyslipidemia, currently stable.  DISPOSITION:  The patient has been followed by physical therapy and occupational therapy.  The patient was evaluated by CIR/inpatient rehab today and also recommended OT evaluation prior to their decision.  OT evaluation was done today as well and recommended CIR for DC planning.  We will await decision from the inpatient rehab regarding the acceptance. Discharge medications will be dictated at the actual time of discharge by  the rounding MD.     Estill Cotta, MD     RR/MEDQ  D:  06/26/2011  T:  06/26/2011  Job:  [id number]  cc:   Elzie Rings. Lorrene Reid, M.D. Robert A. Alyson [name], M.D. Nelda Severe Kellie Simmering, M.D. Newt Minion, MD  Electronically Signed by Nira Conn Alben Jepsen  on 07/05/2011 02:02:09 PM

## 2011-07-11 NOTE — Op Note (Signed)
  NAMEDELANIE, [name]  MEDICAL RECORD NO.:  [id number]  LOCATION:  6703                         FACILITY:  [facility]  PHYSICIAN:  Newt Minion, MD     DATE OF BIRTH:  13-Dec-1934  DATE OF PROCEDURE:  06/22/2011 DATE OF DISCHARGE:  06/12/2011                              OPERATIVE REPORT   PREOPERATIVE DIAGNOSIS:  Gangrene left fifth toe, status post revascularization.  POSTOPERATIVE DIAGNOSIS:  Gangrene left fifth toe, status post revascularization.  PROCEDURE:  Left fifth toe and metatarsal amputation.  SURGEON:  Newt Minion, MD  ANESTHESIA:  General.  ESTIMATED BLOOD LOSS:  Minimal.  ANTIBIOTICS:  Zosyn preoperatively.  DRAINS:  None.  COMPLICATIONS:  None.  TOURNIQUET TIME:  None.  DISPOSITION:  To PACU in stable condition.  INDICATIONS FOR PROCEDURE:  The patient is a 77 year old woman with end- stage renal disease on dialysis, diabetes, peripheral vascular disease who presents status post revascularization for the left lower extremity with a gangrenous toe.  The patient previously to her revascularization had a gangrene extending into the mid foot.  Due to the improved resolution across the midfoot, the patient presents at this time for fifth ray amputation.  Risks and benefits were discussed including infection, neurovascular injury, nonhealing of the wound, need for higher-level amputation.  The patient states she understands and wished to proceed at this time.  DESCRIPTION OF PROCEDURE:  The patient was brought to the OR room 7 and underwent a general anesthetic.  After adequate level of anesthesia was obtained, the patient's left lower extremity was prepped using DuraPrep and draped into a sterile field.  A racket incision was made around the toe.  The metatarsal and fifth toe were resected through the proximal aspect of the metatarsal and electrocautery was used for hemostasis. The wound was irrigated with normal  saline.  There was good petechial bleeding.  The ACell  powder and a Xenograft powder was placed deep within the wound.  The incision was closed using 2-0 nylon with a modified vertical mattress suture.  The ACell  tissue graft was then applied superficial to the surgical incision.  This was covered with Adaptic, 4x4s, Kerlix, and Coban.  The patient was extubated and taken to the PACU in stable condition.  Start nitroglycerin patches to the ankle.  Follow up with touchdown weightbearing.     Newt Minion, MD     MVD/MEDQ  D:  06/22/2011  T:  06/23/2011  Job:  [id number]  Electronically Signed by Meridee Score MD on 07/11/2011 09:39:57 AM

## 2011-07-12 NOTE — Consult Note (Signed)
[name], [name]  MEDICAL RECORD NO.:  [id number]  LOCATION:  [id number]                         FACILITY:  [facility]  PHYSICIAN:  Margaret Tontogany, MD       DATE OF BIRTH:  [birth year]  DATE OF CONSULTATION: DATE OF DISCHARGE:                                CONSULTATION   DIAGNOSIS:  Severe peripheral vascular disease.  HISTORY OF PRESENT ILLNESS:  This is a 77 year old African [name] female who has a history of peripheral vascular disease and is a known diabetic.  She states her left toe has been turning black for over a week now.  She states she thought it was infected and therefore went to the podiatrist at which time he removed the nail and started her on Keflex.  She states since that time, it has gotten worse.  Pain has been progressive and is now involving all of her toes and forefoot.  She also states that she took a razor blade and tried to scrape some dead skin of her left big toe and now it has sore as well.  In August 2011, Dr. Trula [name] stented her left common femoral artery and left external iliac artery.  The left common iliac artery was ectatic in the midportion with slightly minimal narrowing at its origin.  Proximal left external iliac artery had mild luminal narrowing.  The distal left external iliac artery was widely patent.  The left profunda femoral artery was small but patent.  The left SFA with disease, specifically at the abductor canal.  There was a focal high-grade stenosis at the artery crossing the bone at approximately 95%.  There was also 50% stenosis with popliteal artery.  The anterior tibial artery with the dominant runoff.  There was diffuse disease out onto the foot.  After her intervention, ABIs revealed 1.28 on the right, 1.41 on the left.  Right leg was biphasic and the left leg was monophasic.  Vascular surgery has been consulted.  PAST MEDICAL/SURGICAL HISTORY: 1. Peripheral vascular disease.     a.      Status post stenting of the left PFA and left EIA in August      2011. 2. End-stage renal disease.     a.     Hemodialysis Monday, Wednesday, and Friday.     b.     Multiple access surgeries.     c.     Functioning left thigh graft. 3. Diabetes. 4. Hypertension. 5. Hyperlipidemia. 6. History of myoclonic jerk. 7. History of gout. 8. COPD. 9. Hemorrhoids. 10.History of bilateral cataract surgery. 11.History of gastric ulcers in 1960s.  ALLERGIES:  No known drug allergies.  HOME MEDICATIONS:  Unknown at this time.  CURRENT HOSPITAL MEDICATIONS: 1. Heparin drip. 2. Zosyn. 3. Vancomycin. 4. Morphine. 5. Oxycodone. 6. Ambien.  FAMILY HISTORY:  Noncontributory.  SOCIAL HISTORY:  She denies EtOH, illicit drug use, or tobacco use.  REVIEW OF SYSTEMS:  GENERAL:  She denies fever or chills.  She has not had any recent weight loss but states she had quite a bit of weight loss when she started on dialysis.  HEENT:  Denies any history of sinusitis or  dentures.  She does have a history of cataracts with removal and denies glaucoma.  NEURO:  She denies history of CVA or seizures. CARDIOVASCULAR:  She denies chest pain or any history of MI.  PULMONARY: She has shortness of breath for weight loss but none since.  GI:  She denies any peptic ulcer disease except for back in the 60s, none since then.  She denies any history of melena.  GENITOURINARY:  She is oliguric.  She denies any hematuria or dysuria.  ENDOCRINE:  Positive for diabetes.  She denies any thyroid disease.  EXTREMITIES:  She has pain at the end of her toes.  Her left third toe and left fifth toe, the toenail is off.  She does have some rest pain in the left foot.  She denies any claudication in her calves.  PHYSICAL EXAMINATION:  VITAL SIGNS:  Her blood pressure is 138/53, pulse is 67, temperature is 98.2, and she is 94% on room air. GENERAL:  She is in no acute distress. HEENT:  Pupils are equal, round, and reactive to  light.  There is a positive left carotid bruit. NEURO:  There are no focal defects. HEART:  Regular rate and rhythm. LUNGS:  Clear to auscultation bilaterally. ABDOMEN:  Soft, nontender, nondistended with positive bowel sounds. EXTREMITIES:  Her left fifth toe is necrotic.  Her third left toe is without the nail bed and appears to be healing.  Her left big toe has an ulcer on the plantar surface.  In her right foot, there was a faint palpable right dorsalis pedal pulse.  There are no palpable or Doppler pulses in the left foot.  She does have a functioning left thigh graft with a good thrill.  ASSESSMENT:  This is a 21-year [place] [name] female with significant history of peripheral vascular disease who was also diabetic and end- stage renal disease.  PLAN:  She will need an arteriogram.     Margaret Gong, PA   ______________________________ Margaret Aberdeen, MD   Margaret/MEDQ  D:  06/03/2011  T:  06/03/2011  Job:  [id number]  Electronically Signed by Margaret Gong PA on 07/10/2011 02:18:43 PM Electronically Signed by Margaret Barthel MD on 07/12/2011 10:08:45 AM

## 2011-07-12 NOTE — Consult Note (Signed)
  NAMESHACORA, HALLUMS NO.:  [id number]  MEDICAL RECORD NO.:  [id number]  LOCATION:  [id number]                         FACILITY:  [facility]  PHYSICIAN:  Conrad Bridgewater, MD       DATE OF BIRTH:  August 08, 1934  DATE OF CONSULTATION: DATE OF DISCHARGE:                                CONSULTATION   ADDENDUM  PLAN:  We will also need to obtain a carotid duplex scan due to her having a left carotid bruit, however she is asymptomatic and it is not urgent at this time.     Evorn Gong, PA   ______________________________ Conrad Tremonton, MD    SE/MEDQ  D:  06/03/2011  T:  06/03/2011  Job:  [id number]  Electronically Signed by Evorn Gong PA on 07/10/2011 02:18:54 PM Electronically Signed by [name] MD on 07/12/2011 10:08:50 AM

## 2011-07-18 NOTE — Op Note (Signed)
[name], [name]  MEDICAL RECORD NO.:  [id number]  LOCATION:                                 FACILITY:  PHYSICIAN:  Nelda Severe. Kellie Simmering, M.D.  DATE OF BIRTH:  1934-08-13  DATE OF PROCEDURE:  06/14/2011 DATE OF DISCHARGE:                              OPERATIVE REPORT   PREOPERATIVE DIAGNOSIS:  Ischemia of left foot with dry gangrene, left fifth toe and rest pain secondary to femoral-popliteal tibial occlusive disease.  POSTOPERATIVE DIAGNOSIS:  Ischemia of left foot with dry gangrene, left fifth toe and rest pain secondary to femoral-popliteal tibial occlusive disease.  OPERATION:  Left common femoral to popliteal (below knee) bypass using a 6-mm Gore-Tex-Propaten graft with intraoperative arteriogram.  SURGEON:  Nelda Severe. Kellie Simmering, MD  FIRST ASSISTANT:  Evorn Gong, PA  ANESTHESIA:  General endotracheal.  PROCEDURE IN DETAIL:  The patient was taken to the operating room and placed in supine position at which time satisfactory general endotracheal anesthesia was administered.  Left leg was prepped with Betadine scrub and solution and draped in routine sterile manner.  The patient recently had removal of a AV graft in the left thigh because of severe steal syndrome and ischemic fifth toe.  A longitudinal incision was made through the previous recent wound which was healing nicely. This was carried down through subcutaneous tissue and the stump on the artery and venous end which had been oversewn adjacent to the anastomosis was identified.  Superficial femoral artery was exposed distally and dissection continued proximally up to and proximal to the previous arterial anastomosis getting control of the common femoral artery beneath the inguinal ligament.  Profunda femoris was also dissected free for control.  Medial incision was made below the knee, sparing the saphenous vein which was a small vein.  Popliteal artery was exposed  below the knee where it was a soft normal-appearing vessel.  A subfascial anatomic tunnel was created and a 6-mm Gore-Tex-Propaten graft delivered through the tunnel.  The patient was given 5000 units of heparin intravenously.  Popliteal artery occluded proximally and distally with vessel loops, opened with #15 blade, and extended with Potts scissors to easily accept 4-mm dilator.  The Gore-Tex was spatulated and anastomosed end-to-side with 6-0 Prolene.  Vessel loops were released.  Attention turned to the inguinal area where the femoral vessels were occluded with vascular clamps.  The old Gore-Tex to femoral artery anastomosis was almost completely excised leaving a small rim and the arteriotomy was extended proximally up into the native vessel and there was excellent inflow present.  The Gore-Tex was spatulated and anastomosed end-to-side with 6-0 Prolene.  Clamps were then released. There was excellent pulse in the graft and distal popliteal artery with excellent Doppler flow in the anterior tibial artery at the foot. Intraoperative arteriogram revealed widely patent anastomosis distally with the best runoff vessel being anterior tibial artery.  The peroneal artery was patent but diseased and the posterior tibial artery was occluded distally. Protamine was given to reverse the heparin following adequate hemostasis.  Wound was irrigated with saline and closed in layers with Vicryl in a subcuticular fashion.  Sterile dressing was applied.  The patient was taken to the recovery room in satisfactory condition.     Nelda Severe Kellie Simmering, M.D.     JDL/MEDQ  D:  06/14/2011  T:  06/15/2011  Job:  [id number]  Electronically Signed by [name] M.D. on 07/18/2011 03:04:17 PM

## 2011-07-19 NOTE — Discharge Summary (Signed)
[name], [name]  MEDICAL RECORD NO.:  [id number]  LOCATION:                                 FACILITY:  PHYSICIAN:  Leana Gamer, MDDATE OF BIRTH:  [birth year]  DATE OF ADMISSION:  06/02/2011 DATE OF DISCHARGE:  06/28/2011                              DISCHARGE SUMMARY   DISCHARGE DISPOSITION:  Skilled nursing facility.  FINAL DISCHARGE DIAGNOSES: 1. Ischemic left foot status post left common femoral popliteal     bypass. 2. Dry gangrene secondary to ischemia of the left foot. 3. Status post excision of left arteriovenous graft. 4. 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 5. Postoperative anemia/acute blood loss anemia status post infusion. 6. End-stage renal disease. 7. Diabetes type 2, uncontrolled. 8. Hypertension. 9. Secondary hyperparathyroidism. 10.Chronic abdominal pain. 11.Postoperative ileus, resolved. 12.Generalized deconditioning.  SECONDARY DIAGNOSIS:  Palpitations.  DISCHARGE MEDICATIONS: 1. Alprazolam 0.25 mg p.o. b.i.d. p.r.n. anxiety. 2. Elemental calcium as carbonate supplement 500 mg p.o. q.6 h p.r.n. 3. Calcium acetate 667 mg p.o. daily with supper. 4. Aranesp 100 mcg per 0.5 mL 100 mcg IV on Friday at hemodialysis. 5. Ferrous gluconate 125 mg IV on Wednesday at hemodialysis. 6. Hydrocortisone 1% cream applied topically 3 times a day as needed. 7. Hydroxyzine 25 mg p.o. q.8 h p.r.n. itching. 8. Loperamide 2-4 mg by mouth as needed for diarrhea. 9. Nitroglycerin 0.3 mg per hour patch apply transdermally to the left     ankle daily. 10.Oxycodone 5 mg instant release tablets 5-10 mg p.o. q.4 h p.r.n.     pain. 11.Zemplar 2 mcg IV Monday, Wednesday, and Friday at hemodialysis. 12.Tucks ointment one application rectally as needed for hemorrhoids. 13.Ultram 50-100 mcg p.o. q.12 h p.r.n. pain. 14.Ambien 5 mg p.o. at bedtime p.r.n. insomnia. 15.Aspirin 81 mg p.o. daily. 16.Gabapentin 300 mg p.o. at  bedtime. 17.Metoprolol XL 50 mg p.o. at bedtime. 18.Renal vitamin 1 tablet p.o. daily. 19.Simvastatin 40 mg p.o. at bedtime. 20.Sorbitol 70% 30 mL p.o. at bedtime as needed for constipation.  CONSULTS: 1. Dr. [name], Vascular Surgery. 2. Dr. Ron Parker, Cardiology. 3. Dr. Sharol Given, Orthopedic Surgery. 4. Dr. Lorrene Reid, Nephrology.  PROCEDURES: 1. Excision of left thigh AV graft. 2. Excision of lymphocele. 3. Placement of right femoral vein tunnel dialysis catheter. 4. Right femoral vein cannulation under ultrasound guidance. 5. Left common femoral to popliteal bypass with intraoperative     arteriogram. 6. Left fifth toe metatarsal amputation.  DIAGNOSTIC STUDIES: 1. The patient had multiple x-rays.  First x-ray was the x-ray of the     left foot done on admission which shows no definite evidence of     osteomyelitis.  Some loss of soft tissues noted along the distal     aspect of the left fifth toe. 2. Two-view chest x-ray done on June 6 which showed mild cardiac     enlargement.  No evidence of active pulmonary disease. 3. Chest x-ray on June 7 which shows femoral dialysis catheter tip in     the right atrium. 4. X-ray of the left foot which shows no acute fracture or     subluxation. 5. Intraoperative left lower extremity arteriography  which shows left     femoral below knee popliteal bypass graft with runoff via the     posterior tibial artery and a diseased peritoneal artery. 6. Ultrasound venous access and bilateral extremity venography which     shows:     a.     Right axillary vein, subclavian vein, innominate vein, and      SVC are patent for patent right-sided central venous structures.     b.     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 7. One-view abdomen which shows a nonobstructive bowel-gas pattern. 8. ABIs of the right lower extremity which showed mild decreased flow     within normal waveforms, great toe pressure within normal  limits.     Left pedal pulses absent x3 and great toe was flat. 9. ABIs done on June 16 which shows antegrade normal arterial flow,     monophasic waveforms are consistent with moderate-to-severe     peripheral disease, improvement in blood flow postoperatively.  PRIMARY CARE PHYSICIAN:  Robert A. Margaret Ingles, MD  ALLERGIES:  No known drug allergies.  CODE STATUS:  Full code.  CHIEF COMPLAINT:  Left fifth toe pain and blackened fifth toe.  HISTORY OF PRESENT ILLNESS:  Please refer to the H and P by Dr. Steward [name] for details of the HPI.  However, in short, [name] is a 77 year old patient with diabetes and peripheral vascular disease who has been having her left foot turning back for over 4 weeks now.  She states that approximately a week prior to presentation, it started to look somewhat infected.  She saw podiatrist who removed her toenail and put her on Keflex.  Since that time, the toe got progressively worse and presents with an almost necrotic left toe.  The patient also complained of progressive pain that was in all the toes to the mid forefoot.  She was referred to [facility] for further evaluation and management.  HOSPITAL COURSE: 1. Severe peripheral vascular disease leading to ischemic foot with     left fifth toe gangrene.  The patient was noted to have a toe that     was clinically consistent with gangrene.  The patient was started     on vancomycin and Zosyn and Vascular Surgery was consulted.  The     patient had a left leg angiogram which showed significant     occlusion.  There was also evidence of steal in the graft and it     was felt that the graft needed to be excised in order to have     improvement in circulation.  The patient had excision of the graft     done on June 8.  Following that, the patient continued to have     decreased flow and a femoral popliteal bypass was undertaken which     was successful.  At the same time, the patient had an      intraoperative angiogram.  The patient was also seen duly by     Orthopedics and after the intraoperative angiogram and the left     common femoral to popliteal below knee bypass, the patient     underwent amputation of the left fifth toe and metatarsal by Dr.     Sharol Given.  The patient was seen preoperatively by [facility]     for risk stratification and was felt to have moderate risk     associated with her  fem-pop bypass and amputation of the left fifth     toe.  The vancomycin was eventually discontinued on the patient.     However, the Zosyn was kept on board.  However, there was no     evidence of osteomyelitis and the patient has completed 4 weeks of     Zosyn at this point.  I have spoken with Orthopedic Surgery who     sees no indication for continued Zosyn.  Thus, Zosyn is being     continued prior to her discharge. 2. Postoperative ileus with altered mental status.  On June 23, the     patient appeared to be more lethargic but arousable with nausea and     vomiting.  This was on the day after her left ray amputation.     Abdominal x-ray showed nonobstructive bowel-gas pattern and ileus.     The patient was given bowel rest until she had improvement in her     nausea.  She was then started on a diet with clear liquids and she     is now to advance to a diabetic, heart-healthy diet which she has     been tolerating without any difficulty. 3. Postoperative anemia on top of chronic anemia of chronic disease.     The patient has had multiple interventions during this     hospitalization.  On June 25, the patient was noted to have a     hemoglobin of 6.5.  She received a transfusion of 3 units of packed     red blood cells and presently her hemoglobin is stable at 11.5. 4. End-stage renal disease.  The patient has preexistent end-stage     renal disease and is on dialysis on Tuesday, Thursday, and     Saturday.  The patient had a temporary dialysis access of the     subclavian  catheter.  There was some discussion about permanent     dialysis access, however, the patient has declined permanent access     at this time. 5. Hypertension.  The patient had periods of severe hypertension     during this hospitalization but now appears to be moderately     controlled, although not at goal. 6. Dyslipidemia.  Currently noted in stable. 7. Chronic abdominal pain.  The patient states that she has chronic     abdominal pain which is unchanged from previous hospitalization.     The patient is tolerating her diet and has had no problems with     elimination. 8. Generalized deconditioning.  Given the patient's prolonged and     protracted hospital stay and her multiple surgeries, the patient is     noticeably deconditioned.  The patient was seen by physical therapy     and recommendations are for the patient to go to skilled nursing     facility for short-term rehab prior to returning home.  At the time of discharge, laboratory studies are as follows:  WBC is 9.3, hemoglobin 11.5, hematocrit 34.5, and platelet count 218.  Sodium 134, potassium 3.7, chloride 95, bicarb 22, BUN 37, and creatinine 8.03. Blood sugars were ranging between 116-167.  PHYSICAL EXAMINATION:  GENERAL:  The patient is well appearing. VITAL SIGNS:  Temperature is 98.2, blood pressure 166/74, respiratory rate 20, heart rate is 78, and O2 saturations are 96% on room air. HEENT:  She is normocephalic and atraumatic.  Pupils equally round and reactive to light and accommodation.  Extraocular movements are intact. Oropharynx  is moist.  No exudate, erythema, or lesions noted. NECK:  Her trachea is midline.  There is no masses, no thyromegaly, no JVD, no carotid bruit. RESPIRATORY:  The patient has a normal respiratory effort.  Equal excursion bilaterally.  No wheezing or rhonchi noted. CARDIOVASCULAR:  She has got normal S1 and S2.  No murmurs, rubs, or gallops are noted. ABDOMEN:  Obese, soft, nontender,  and nondistended.  No masses.  No hepatosplenomegaly. EXTREMITIES:  The patient has good pulses to the bilateral lower extremities.  She has got cap refill at less than 3 seconds.  I cannot appreciate any clubbing, cyanosis, or edema.  DIETARY RESTRICTIONS:  The patient should be on a renal diabetic diet.  PHYSICAL RESTRICTIONS:  The patient is under the care of physical therapy.  Total time for this discharge process including face-to-face time approximately 52 minutes.     Leana Gamer, MD     MAM/MEDQ  D:  06/28/2011  T:  06/28/2011  Job:  [id number]  Electronically Signed by Liston Alba MD on 07/19/2011 12:19:14 PM

## 2011-07-19 NOTE — Discharge Summary (Signed)
  NAMEJOSEPHINE, LAFRANCE NO.:  [id number]  MEDICAL RECORD NO.:  [id number]  LOCATION:  6715                         FACILITY:  [facility]  PHYSICIAN:  Leana Gamer, MDDATE OF BIRTH:  09/16/1934  DATE OF ADMISSION:  06/02/2011 DATE OF DISCHARGE:  06/12/2011                              DISCHARGE SUMMARY   ADDENDUM: Physical restrictions per Ortho.  Patient to keep dressing clean and dry, wear the Darco shoe on the left, and toe-down weightbearing on left.  Patient to follow up with Orthopedics in one week after discharge.  In terms of follow up with Vein and Vascular Surgery, the patient to follow up with Vein and Vascular Surgery 2 weeks after discharge and follow with primary care physician.  The patient should follow within 3 to 5 days with primary care physician.     Leana Gamer, MD     MAM/MEDQ  D:  06/28/2011  T:  06/28/2011  Job:  [id number]  Electronically Signed by Liston Alba MD on 07/19/2011 12:18:56 PM

## 2011-08-14 ENCOUNTER — Ambulatory Visit: Payer: Medicare Other | Admitting: Vascular Surgery

## 2011-09-17 ENCOUNTER — Encounter: Payer: Self-pay | Admitting: Vascular Surgery

## 2011-09-17 ENCOUNTER — Other Ambulatory Visit: Payer: Self-pay

## 2011-09-17 DIAGNOSIS — I70219 Atherosclerosis of native arteries of extremities with intermittent claudication, unspecified extremity: Secondary | ICD-10-CM

## 2011-09-17 DIAGNOSIS — Z48812 Encounter for surgical aftercare following surgery on the circulatory system: Secondary | ICD-10-CM

## 2011-09-18 ENCOUNTER — Other Ambulatory Visit: Payer: Medicare Other

## 2011-09-18 ENCOUNTER — Ambulatory Visit: Payer: Medicare Other | Admitting: Vascular Surgery

## 2011-09-19 ENCOUNTER — Other Ambulatory Visit (INDEPENDENT_AMBULATORY_CARE_PROVIDER_SITE_OTHER): Payer: Medicare Other | Admitting: Vascular Surgery

## 2011-09-19 ENCOUNTER — Ambulatory Visit (INDEPENDENT_AMBULATORY_CARE_PROVIDER_SITE_OTHER): Payer: Medicare Other | Admitting: Vascular Surgery

## 2011-09-19 DIAGNOSIS — I739 Peripheral vascular disease, unspecified: Secondary | ICD-10-CM

## 2011-09-19 DIAGNOSIS — Z48812 Encounter for surgical aftercare following surgery on the circulatory system: Secondary | ICD-10-CM

## 2011-09-19 DIAGNOSIS — I6529 Occlusion and stenosis of unspecified carotid artery: Secondary | ICD-10-CM

## 2011-09-19 DIAGNOSIS — R0989 Other specified symptoms and signs involving the circulatory and respiratory systems: Secondary | ICD-10-CM

## 2011-09-20 ENCOUNTER — Encounter: Payer: Self-pay | Admitting: Vascular Surgery

## 2011-09-21 LAB — POCT I-STAT 4, (NA,K, GLUC, HGB,HCT)
HCT: 38
Operator id: 181601
Sodium: 136

## 2011-09-24 ENCOUNTER — Encounter: Payer: Self-pay | Admitting: Vascular Surgery

## 2011-09-24 LAB — POCT I-STAT 4, (NA,K, GLUC, HGB,HCT)
Glucose, Bld: 129 — ABNORMAL HIGH
Hemoglobin: 16 — ABNORMAL HIGH
Potassium: 4.3
Sodium: 135

## 2011-09-25 ENCOUNTER — Ambulatory Visit (INDEPENDENT_AMBULATORY_CARE_PROVIDER_SITE_OTHER): Payer: Medicare Other | Admitting: Vascular Surgery

## 2011-09-25 ENCOUNTER — Encounter: Payer: Self-pay | Admitting: Vascular Surgery

## 2011-09-25 VITALS — BP 155/54 | HR 60 | Resp 20 | Ht <= 58 in | Wt 146.0 lb

## 2011-09-25 DIAGNOSIS — I70269 Atherosclerosis of native arteries of extremities with gangrene, unspecified extremity: Secondary | ICD-10-CM

## 2011-09-25 DIAGNOSIS — I6529 Occlusion and stenosis of unspecified carotid artery: Secondary | ICD-10-CM

## 2011-09-25 LAB — POCT I-STAT 4, (NA,K, GLUC, HGB,HCT): Glucose, Bld: 149 — ABNORMAL HIGH

## 2011-09-25 NOTE — Progress Notes (Signed)
Subjective:     Patient ID: [name], female   DOB: September 23, 1934, 77 y.o.   MRN: [id number]  HPI this 77 year old female returns today for initial followup regarding her left femoral popliteal bypass graft done for severe femoral popliteal occlusive disease with gangrene of the left fifth toe. The bypass was performed in June of this year. The bypass has functioned nicely and the left fifth toe amputation performed on Dr. Sharol Given has healed well.patient also has known carotid occlusive disease he denies any neurologic symptoms such as any paresthesias,  aphasia blurred vision or syncope. She has no history of stroke.  Past Medical History  Diagnosis Date  . Unspecified essential hypertension   . Hyperpotassemia   . End stage renal disease   . Other specified cardiac dysrhythmias   . Difficulty in walking   . Gout, unspecified   . Type II or unspecified type diabetes mellitus without mention of complication, not stated as uncontrolled   . Mild mitral valve stenosis   . Arthritis   . Joint pain   . Leg pain   . Thyroid disease   . Gangrene     left fifth toe  . Peripheral vascular disease   . Anemia   . Hyperlipidemia   . Aortic valve sclerosis     History  Substance Use Topics  . Smoking status: Former Smoker    Quit date: 12/31/1988  . Smokeless tobacco: Not on file  . Alcohol Use: No     hx of abuse stopped 1990    Family History  Problem Relation Age of Onset  . Peripheral vascular disease Mother     No Known Allergies  Current outpatient prescriptions:amLODipine (NORVASC) 5 MG tablet, Take 1 tablet by mouth daily., Disp: , Rfl: ;  calcium acetate (PHOSLO) 667 MG capsule, Take 1 tablet by mouth daily., Disp: , Rfl: ;  cinacalcet (SENSIPAR) 30 MG tablet, Take 30 mg by mouth daily.  , Disp: , Rfl: ;  cloNIDine (CATAPRES) 0.2 MG tablet, Take 0.2 mg by mouth 2 (two) times daily.  , Disp: , Rfl:  docusate sodium (COLACE) 100 MG capsule, Take 100 mg by mouth at bedtime.  ,  Disp: , Rfl: ;  gabapentin (NEURONTIN) 300 MG capsule, Take 1 tablet by mouth At bedtime., Disp: , Rfl: ;  hydrocodone-acetaminophen (LORCET-HD) 5-500 MG per capsule, Take 1 capsule by mouth every 6 (six) hours as needed.  , Disp: , Rfl: ;  losartan (COZAAR) 100 MG tablet, Take 1 tablet by mouth daily., Disp: , Rfl:  metoprolol (TOPROL-XL) 100 MG 24 hr tablet, Take 50 mg by mouth daily.  , Disp: , Rfl: ;  simvastatin (ZOCOR) 40 MG tablet, Take 40 mg by mouth at bedtime.  , Disp: , Rfl: ;  traMADol (ULTRAM) 50 MG tablet, Take 50 mg by mouth as needed.  , Disp: , Rfl:   BP 155/54  Pulse 60  Resp 20  Ht 4\' 10"  (1.473 m)  Wt 146 lb (66.225 kg)  BMI 30.51 kg/m2  Body mass index is 30.51 kg/(m^2).        Review of Systems she currently denies chest pain, dyspnea on exertion, PND, orthopnea. She complains of arthritis, joint pain, muscle pain, and all other systems are negative    Objective:   Physical Exam blood pressure 155/54 heart rate 60 respirations 20 General she is an elderly female no apparent stress alert and oriented x3 Chest no rhonchi or wheezing HEENT exam normal for age  Cardiovascular regular rhythm no murmurs carotid pulses 3+ with soft bruits bilaterally Abdomen soft nontender with no masses Left leg 3+ femoral 2+ popliteal graft pulse. Left foot is well-perfused. Left fifth toe amputation site well healed. Right leg has a hemodialysis catheter in the right femoral vein    Today I ordered lower extremity Doppler studies and a scan of the left femoral-popliteal graft. The graft is widely patent. ABI is approximately 0.8 in the left leg. Carotid duplex exam revealed moderate internal carotid stenoses bilaterally right worse than left approximately 70%  Assessment:     Nicely functioning left femoral-popliteal bypass and moderate asymptomatic carotid occlusive disease    Plan:     We'll follow a regular basis for progression of carotid disease and patency of left  femoral-popliteal bypass

## 2011-09-25 NOTE — Progress Notes (Signed)
Addended by: Denman George on: 09/25/2011 04:58 PM   Modules accepted: Orders

## 2011-09-26 NOTE — Procedures (Unsigned)
BYPASS GRAFT EVALUATION  INDICATION:  Follow up peripheral vascular disease.  HISTORY: Diabetes:  Yes. Cardiac:  No. Hypertension:  Yes. Smoking:  Previous. Previous Surgery:  Left femoral-to-popliteal artery bypass graft on 06/14/2011.  SINGLE LEVEL ARTERIAL EXAM                              RIGHT              LEFT Brachial: Anterior tibial: Posterior tibial: Peroneal: Ankle/brachial index:  PREVIOUS ABI:  Date: 08/22/2010  RIGHT:  1.28  LEFT:  1.41  LOWER EXTREMITY BYPASS GRAFT DUPLEX EXAM:  DUPLEX: 1. Dense heterogenous plaque noted in the left common femoral,     profunda femoral, and tibioperoneal trunk with elevated velocities     and ratio suggestive of 50% to 75% stenosis. 2. Elevated velocities in the proximal anastomosis of the left femoral     to popliteal bypass graft, suggestive of >75% stenosis by ratio of     7.31.  IMPRESSION: 1. Patent left femoral to popliteal artery bypass graft with stenosis     present, as noted above. 2. Native artery stenosis present, as noted above. 3. Right ankle brachial index not obtained due to dialysis catheter     present in the right thigh. 4. Left ankle brachial index using the dorsalis pedis artery of 0.80     and maybe over-estimated due to vessel wall calcification. 5. Abnormal blunted PPG waveforms present on the left 1st through 4th     digits with the 5th digit previously amputated.  ___________________________________________ Nelda Severe. Kellie Simmering, M.D.  SH/MEDQ  D:  09/19/2011  T:  09/19/2011  Job:  [id number]

## 2011-09-26 NOTE — Procedures (Unsigned)
CAROTID DUPLEX EXAM  INDICATION:  Bruit.  HISTORY: Diabetes:  Yes. Cardiac:  No. Hypertension:  Yes. Smoking:  Previous. Previous Surgery:  No carotid intervention. CV History:  Asymptomatic. Amaurosis [place]ax No, Paresthesias No, Hemiparesis No.                                      RIGHT             LEFT Brachial systolic pressure:         131               128 Brachial Doppler waveforms:         WNL               WNL Vertebral direction of flow:        Antegrade         Antegrade DUPLEX VELOCITIES (cm/sec) CCA peak systolic                   89                74 ECA peak systolic                   201               0000000 ICA peak systolic                   284               [id number] ICA end diastolic                   61                43 PLAQUE MORPHOLOGY:                  Calcified         Calcified PLAQUE AMOUNT:                      Moderate          Moderate PLAQUE LOCATION:                    CCA, ICA, ECA     CCA, ICA, ECA  IMPRESSION: 1. Bilateral internal carotid artery stenosis in the 40% to 59% range. 2. Bilateral external carotid artery stenosis present. 3. Bilateral vertebral arteries are patent and antegrade.  ___________________________________________ Nelda Severe. Kellie Simmering, M.D.  SH/MEDQ  D:  09/19/2011  T:  09/19/2011  Job:  [id number]

## 2011-09-27 LAB — PROTIME-INR
INR: 0.9 (ref 0.00–1.49)
Prothrombin Time: 12.8 seconds (ref 11.6–15.2)

## 2011-09-27 LAB — DIFFERENTIAL
Basophils Absolute: 0
Basophils Absolute: 0 10*3/uL (ref 0.0–0.1)
Basophils Relative: 0
Eosinophils Absolute: 0.4 10*3/uL (ref 0.0–0.7)
Eosinophils Relative: 5
Lymphocytes Relative: 9 — ABNORMAL LOW
Lymphocytes Relative: 30 % (ref 12–46)
Neutro Abs: 7.5
Neutrophils Relative %: 52 % (ref 43–77)

## 2011-09-27 LAB — POCT I-STAT, CHEM 8
BUN: 54 mg/dL — ABNORMAL HIGH (ref 6–23)
Calcium, Ion: 1.07 mmol/L — ABNORMAL LOW (ref 1.12–1.32)
HCT: 42 % (ref 36.0–46.0)
TCO2: 24 mmol/L (ref 0–100)

## 2011-09-27 LAB — CBC
HCT: 29 — ABNORMAL LOW
HCT: 30 — ABNORMAL LOW
Hemoglobin: 9.9 — ABNORMAL LOW
MCHC: 32.7 g/dL (ref 30.0–36.0)
MCV: 91
MCV: 91.5
Platelets: 287
Platelets: 171 10*3/uL (ref 150–400)
RBC: 3.28 — ABNORMAL LOW
RDW: 19.3 — ABNORMAL HIGH
RDW: 20.7 % — ABNORMAL HIGH (ref 11.5–15.5)
WBC: 9.8

## 2011-09-27 LAB — TROPONIN I
Troponin I: 0.01
Troponin I: 0.01
Troponin I: <0.01

## 2011-09-27 LAB — COMPREHENSIVE METABOLIC PANEL
BUN: 50 — ABNORMAL HIGH
CO2: 25
Chloride: 90 — ABNORMAL LOW
Creatinine, Ser: 10.15 — ABNORMAL HIGH
GFR calc non Af Amer: 4 — ABNORMAL LOW
Glucose, Bld: 224 — ABNORMAL HIGH
Total Bilirubin: 0.9

## 2011-09-27 LAB — CK TOTAL AND CKMB (NOT AT ARMC)
CK, MB: 0.5
Relative Index: INVALID
Relative Index: INVALID
Total CK: 35
Total CK: 54

## 2011-09-27 LAB — RENAL FUNCTION PANEL
Albumin: 2.5 — ABNORMAL LOW
BUN: 60 — ABNORMAL HIGH
Creatinine, Ser: 11.18 — ABNORMAL HIGH
Phosphorus: 5.6 — ABNORMAL HIGH

## 2011-09-27 LAB — POCT CARDIAC MARKERS
CKMB, poc: 1
Myoglobin, poc: 490

## 2011-09-27 LAB — LIPASE, BLOOD: Lipase: 14

## 2011-09-27 LAB — APTT: aPTT: 30 seconds (ref 24–37)

## 2011-10-05 LAB — POCT I-STAT 4, (NA,K, GLUC, HGB,HCT)
HCT: 35 % — ABNORMAL LOW (ref 36.0–46.0)
HCT: 26 — ABNORMAL LOW
Hemoglobin: 11.9 g/dL — ABNORMAL LOW (ref 12.0–15.0)
Operator id: 206361

## 2011-10-05 LAB — GLUCOSE, CAPILLARY: Glucose-Capillary: 94 mg/dL (ref 70–99)

## 2011-10-08 ENCOUNTER — Inpatient Hospital Stay (INDEPENDENT_AMBULATORY_CARE_PROVIDER_SITE_OTHER)
Admission: RE | Admit: 2011-10-08 | Discharge: 2011-10-08 | Disposition: A | Discharge status: Home / Self Care | Payer: Medicare Other | Source: Ambulatory Visit | Attending: Family Medicine | Admitting: Family Medicine

## 2011-10-08 ENCOUNTER — Ambulatory Visit (INDEPENDENT_AMBULATORY_CARE_PROVIDER_SITE_OTHER): Payer: Medicare Other

## 2011-10-08 DIAGNOSIS — S40019A Contusion of unspecified shoulder, initial encounter: Secondary | ICD-10-CM

## 2012-01-03 ENCOUNTER — Other Ambulatory Visit: Payer: Medicare Other

## 2012-01-10 ENCOUNTER — Ambulatory Visit (INDEPENDENT_AMBULATORY_CARE_PROVIDER_SITE_OTHER): Payer: Medicare Other | Admitting: *Deleted

## 2012-01-10 ENCOUNTER — Other Ambulatory Visit (INDEPENDENT_AMBULATORY_CARE_PROVIDER_SITE_OTHER): Payer: Medicare Other | Admitting: *Deleted

## 2012-01-10 DIAGNOSIS — Z48812 Encounter for surgical aftercare following surgery on the circulatory system: Secondary | ICD-10-CM

## 2012-01-10 DIAGNOSIS — I739 Peripheral vascular disease, unspecified: Secondary | ICD-10-CM

## 2012-01-15 ENCOUNTER — Encounter: Payer: Self-pay | Admitting: Vascular Surgery

## 2012-01-15 NOTE — Procedures (Unsigned)
BYPASS GRAFT EVALUATION  INDICATION:  Follow up left fem-pop graft history.  HISTORY: Diabetes:  Yes. Cardiac:  No. Hypertension:  Yes. Smoking:  Previous. Previous Surgery:  Left fem-pop graft, 06/14/11; left 5th digit toe removal.  SINGLE LEVEL ARTERIAL EXAM                              RIGHT              LEFT Brachial: Anterior tibial: Posterior tibial: Peroneal: Toe/brachial index:          0.35               0.60  PREVIOUS ABI:  Date: 09/19/11  RIGHT:  Not obtained  LEFT:  [place]   LOWER EXTREMITY BYPASS GRAFT DUPLEX EXAM:  DUPLEX:  Widely patent left fem-pop graft with velocities of 451 cm/s and disease present in the native common femoral arterial inflow. Waveforms throughout the graft are monophasic.   IMPRESSION:  Significant stenosis of the left common femoral artery, which serves as the inflow to a widely patent left  fem-pop graft.          ___________________________________________ Nelda Severe. Kellie Simmering, M.D.  LT/MEDQ  D:  01/10/2012  T:  01/10/2012  Job:  [id number]

## 2012-01-31 ENCOUNTER — Ambulatory Visit: Payer: Medicare Other

## 2012-02-13 ENCOUNTER — Encounter: Payer: Self-pay | Admitting: Physician Assistant

## 2012-02-14 ENCOUNTER — Ambulatory Visit: Payer: Medicare Other

## 2012-02-20 ENCOUNTER — Encounter: Payer: Self-pay | Admitting: Thoracic Diseases

## 2012-02-21 ENCOUNTER — Encounter (INDEPENDENT_AMBULATORY_CARE_PROVIDER_SITE_OTHER): Payer: Medicare Other | Admitting: *Deleted

## 2012-02-21 ENCOUNTER — Encounter (HOSPITAL_COMMUNITY): Payer: Self-pay | Admitting: Pharmacy Technician

## 2012-02-21 ENCOUNTER — Other Ambulatory Visit: Payer: Self-pay | Admitting: *Deleted

## 2012-02-21 ENCOUNTER — Ambulatory Visit (INDEPENDENT_AMBULATORY_CARE_PROVIDER_SITE_OTHER): Payer: Medicare Other | Admitting: Thoracic Diseases

## 2012-02-21 VITALS — BP 160/65 | HR 65 | Resp 18

## 2012-02-21 DIAGNOSIS — I739 Peripheral vascular disease, unspecified: Secondary | ICD-10-CM

## 2012-02-21 DIAGNOSIS — I779 Disorder of arteries and arterioles, unspecified: Secondary | ICD-10-CM

## 2012-02-21 DIAGNOSIS — Z48812 Encounter for surgical aftercare following surgery on the circulatory system: Secondary | ICD-10-CM

## 2012-02-21 NOTE — Progress Notes (Signed)
[facility] [place]  Postoperative Visit Bypass Surgery Date of Surgery: Left Fem-pop 06/14/11 Left 5th toe ray amp by Dr. Sharol Given Surgeon: Victorino Dike, MD  History of Present Illness  [name] is a 78 y.o. female who presents for  follow-up for: left fem-pop bypass. Pt was sent by Dr Marval Regal because her 3rd toe is becoming dark.  She has had intermittent pain in the toe but none recently. She denies any other symptoms of night pain or rest pain.The patient's wounds are healed.   VASC. LAB Studies: 02/21/2012            Bypass is open with velocity of 353cm/s noted at the left proximal anastomosis   Physical Examination  Filed Vitals:   02/21/12 1632  BP: 160/65  Pulse: 65  Resp: 18    Pt is A&O x 3 Gait is normal left lower extremity: Incision/s is/are clean,dry.intact, and  Healed. bilat fem pulses 2+ palpable LLE is warm is without  Edema, with no erythema; with no hematoma 3rd toe non tender to touch - darkened area just below nailbed, no swelling redness or drainage noted LLE Dorsalis Pedis pulse is monophasic by Doppler Posterior tibial pulse is  Absent Peroneal is monophasic by doppler  RLE warm DP is absent PT biphasic by doppler Peroneal monophasic by doppler    Medical Decision Making  [name] is a 78 y.o. year old female who presents s/p left lower extremity bypass surgery . Third toe pain resolved with dark area on anterior of toe Velocities in open bypass are unchanged Will discuss study and symptoms with Dr. Kellie Simmering to see if any further studies needed  The patient's bypass incisions are healed with good resolution of pre-operative symptoms. The patient's surveillance will included ABI and bypass duplex studies which will be  in: 3 months.    Clinic MD: CE Oneida Alar, MD

## 2012-02-25 ENCOUNTER — Encounter (HOSPITAL_COMMUNITY): Payer: Self-pay | Admitting: *Deleted

## 2012-02-25 ENCOUNTER — Other Ambulatory Visit (HOSPITAL_COMMUNITY): Payer: Self-pay | Admitting: *Deleted

## 2012-02-25 MED ORDER — DEXTROSE 5 % IV SOLN
1.5000 g | INTRAVENOUS | Status: AC
  Administered 2012-02-26: 1.5 g via INTRAVENOUS
  Filled 2012-02-25: qty 1.5

## 2012-02-25 MED ORDER — SODIUM CHLORIDE 0.9 % IV SOLN
INTRAVENOUS | Status: DC
  Administered 2012-02-26: 09:00:00 via INTRAVENOUS

## 2012-02-25 NOTE — Procedures (Unsigned)
BYPASS GRAFT EVALUATION  INDICATION:  Left lower extremity bypass graft.  HISTORY: Diabetes:  Yes Cardiac:  No Hypertension:  Yes Smoking:  Previous. Previous Surgery:  Left fem-pop bypass graft 06/14/2011, left fifth digit removal. Other:  Complaint of occasional left third toe pain that lasted for two minutes at a time.  SINGLE LEVEL ARTERIAL EXAM                              RIGHT              LEFT Brachial: Anterior tibial: Posterior tibial: Peroneal: Ankle/brachial index:  PREVIOUS ABI:  Date:  RIGHT:  LEFT.  LOWER EXTREMITY BYPASS GRAFT DUPLEX EXAM:  DUPLEX:  Monophasic Doppler waveforms noted throughout the left lower extremity bypass graft.  Velocity of 353 cm/sec noted at the left proximal anastomosis region with turbulent flow visualized.  IMPRESSION:  Patent left fem-pop bypass graft with elevated velocity, as described above.  Mild increase in the maximum velocity of the left proximal anastomosis noted, however velocity of 451 cm/sec noted on the exam from 01/10/2012 was not adequately observed.  ___________________________________________ Nelda Severe. Kellie Simmering, M.D.  CH/MEDQ  D:  02/22/2012  T:  02/22/2012  Job:  [id number]

## 2012-02-25 NOTE — Progress Notes (Signed)
I spoke with Mrs. [name] for pre op interview.  [name] said that she only takes Clonidine, no other meds except something she takes when she eats.  At first Mrs [name] denied having a cardiologist, but with more questions she remembered seeing someone at Dr Leanora Cover office ad having a stress test and that she is to go back to [facility] in April.   I requested information from [facility] to be faxed to pre- admit.

## 2012-02-26 ENCOUNTER — Encounter (HOSPITAL_COMMUNITY): Payer: Medicare Other

## 2012-02-26 ENCOUNTER — Encounter (HOSPITAL_COMMUNITY)
Admission: RE | Disposition: A | Discharge status: Home / Self Care | Payer: Self-pay | Source: Ambulatory Visit | Attending: Vascular Surgery

## 2012-02-26 ENCOUNTER — Encounter (HOSPITAL_COMMUNITY): Payer: Self-pay | Admitting: *Deleted

## 2012-02-26 ENCOUNTER — Encounter (HOSPITAL_COMMUNITY): Payer: Self-pay | Admitting: Anesthesiology

## 2012-02-26 ENCOUNTER — Ambulatory Visit (HOSPITAL_COMMUNITY): Payer: Medicare Other | Admitting: Anesthesiology

## 2012-02-26 ENCOUNTER — Ambulatory Visit (HOSPITAL_COMMUNITY): Payer: Medicare Other

## 2012-02-26 ENCOUNTER — Ambulatory Visit (HOSPITAL_COMMUNITY)
Admission: RE | Admit: 2012-02-26 | Discharge: 2012-02-26 | Disposition: A | Discharge status: Home / Self Care | Payer: Medicare Other | Source: Ambulatory Visit | Attending: Vascular Surgery | Admitting: Vascular Surgery

## 2012-02-26 DIAGNOSIS — E119 Type 2 diabetes mellitus without complications: Secondary | ICD-10-CM | POA: Insufficient documentation

## 2012-02-26 DIAGNOSIS — I739 Peripheral vascular disease, unspecified: Secondary | ICD-10-CM | POA: Insufficient documentation

## 2012-02-26 DIAGNOSIS — Z992 Dependence on renal dialysis: Secondary | ICD-10-CM | POA: Insufficient documentation

## 2012-02-26 DIAGNOSIS — N186 End stage renal disease: Secondary | ICD-10-CM

## 2012-02-26 DIAGNOSIS — Z452 Encounter for adjustment and management of vascular access device: Secondary | ICD-10-CM | POA: Insufficient documentation

## 2012-02-26 DIAGNOSIS — I120 Hypertensive chronic kidney disease with stage 5 chronic kidney disease or end stage renal disease: Secondary | ICD-10-CM | POA: Insufficient documentation

## 2012-02-26 HISTORY — DX: Encounter for other specified aftercare: Z51.89

## 2012-02-26 HISTORY — DX: Fracture of right shoulder girdle, part unspecified, initial encounter for closed fracture: S42.91XA

## 2012-02-26 LAB — POCT I-STAT 4, (NA,K, GLUC, HGB,HCT)
Hemoglobin: 12.9 g/dL (ref 12.0–15.0)
Potassium: 5.7 mEq/L — ABNORMAL HIGH (ref 3.5–5.1)
Sodium: 133 mEq/L — ABNORMAL LOW (ref 135–145)

## 2012-02-26 LAB — SURGICAL PCR SCREEN
MRSA, PCR: NEGATIVE
Staphylococcus aureus: NEGATIVE

## 2012-02-26 SURGERY — EXCHANGE OF A DIALYSIS CATHETER
Anesthesia: Monitor Anesthesia Care | Wound class: Clean

## 2012-02-26 MED ORDER — SODIUM CHLORIDE 0.9 % IR SOLN
Status: DC | PRN
  Administered 2012-02-26: 10:00:00

## 2012-02-26 MED ORDER — MIDAZOLAM HCL 5 MG/5ML IJ SOLN
INTRAMUSCULAR | Status: DC | PRN
  Administered 2012-02-26: 1 mg via INTRAVENOUS

## 2012-02-26 MED ORDER — HEPARIN SODIUM (PORCINE) 1000 UNIT/ML IJ SOLN
INTRAMUSCULAR | Status: DC | PRN
  Administered 2012-02-26: 7 mL via INTRAVENOUS

## 2012-02-26 MED ORDER — LIDOCAINE-EPINEPHRINE (PF) 1 %-1:200000 IJ SOLN
INTRAMUSCULAR | Status: DC | PRN
  Administered 2012-02-26: 6 mL via INTRADERMAL

## 2012-02-26 MED ORDER — PROMETHAZINE HCL 25 MG/ML IJ SOLN: 6.2500 mg | INTRAMUSCULAR | Status: DC | PRN

## 2012-02-26 MED ORDER — FENTANYL CITRATE 0.05 MG/ML IJ SOLN
INTRAMUSCULAR | Status: DC | PRN
  Administered 2012-02-26: 50 ug via INTRAVENOUS
  Administered 2012-02-26: 25 ug via INTRAVENOUS

## 2012-02-26 MED ORDER — MUPIROCIN 2 % EX OINT
TOPICAL_OINTMENT | Freq: Once | CUTANEOUS | Status: AC
  Administered 2012-02-26: 1 via NASAL

## 2012-02-26 MED ORDER — SODIUM CHLORIDE 0.9 % IV SOLN
INTRAVENOUS | Status: DC | PRN
  Administered 2012-02-26: 10:00:00 via INTRAVENOUS

## 2012-02-26 MED ORDER — FENTANYL CITRATE 0.05 MG/ML IJ SOLN: 25.0000 ug | INTRAMUSCULAR | Status: DC | PRN

## 2012-02-26 MED ORDER — PROPOFOL 10 MG/ML IV EMUL
INTRAVENOUS | Status: DC | PRN
  Administered 2012-02-26: 25 ug/kg/min via INTRAVENOUS

## 2012-02-26 MED ORDER — SODIUM POLYSTYRENE SULFONATE 15 GM/60ML PO SUSP
60.0000 g | ORAL | Status: DC
  Filled 2012-02-26: qty 240

## 2012-02-26 MED ORDER — MUPIROCIN 2 % EX OINT
TOPICAL_OINTMENT | CUTANEOUS | Status: AC
  Administered 2012-02-26: 1 via NASAL
  Filled 2012-02-26: qty 22

## 2012-02-26 MED ORDER — MEPERIDINE HCL 25 MG/ML IJ SOLN: 6.2500 mg | INTRAMUSCULAR | Status: DC | PRN

## 2012-02-26 SURGICAL SUPPLY — 45 items
BAG DECANTER FOR FLEXI CONT (MISCELLANEOUS) ×2 IMPLANT
CATH CANNON HEMO 15F 50CM (CATHETERS) ×1 IMPLANT
CATH CANNON HEMO 15FR 19 (HEMODIALYSIS SUPPLIES) IMPLANT
CATH CANNON HEMO 15FR 23CM (HEMODIALYSIS SUPPLIES) IMPLANT
CATH CANNON HEMO 15FR 31CM (HEMODIALYSIS SUPPLIES) IMPLANT
CATH CANNON HEMO 15FR 32 (HEMODIALYSIS SUPPLIES) IMPLANT
CATH CANNON HEMO 15FR 32CM (HEMODIALYSIS SUPPLIES) IMPLANT
CLOTH BEACON ORANGE TIMEOUT ST (SAFETY) ×2 IMPLANT
COVER PROBE W GEL 5X96 (DRAPES) ×1 IMPLANT
COVER SURGICAL LIGHT HANDLE (MISCELLANEOUS) ×2 IMPLANT
DRAPE C-ARM 42X72 X-RAY (DRAPES) ×2 IMPLANT
DRAPE CHEST BREAST 15X10 FENES (DRAPES) ×2 IMPLANT
GAUZE SPONGE 2X2 8PLY STRL LF (GAUZE/BANDAGES/DRESSINGS) ×1 IMPLANT
GAUZE SPONGE 4X4 16PLY XRAY LF (GAUZE/BANDAGES/DRESSINGS) ×2 IMPLANT
GLOVE BIO SURGEON STRL SZ7 (GLOVE) ×2 IMPLANT
GLOVE BIOGEL PI IND STRL 6.5 (GLOVE) IMPLANT
GLOVE BIOGEL PI IND STRL 7.5 (GLOVE) ×1 IMPLANT
GLOVE BIOGEL PI INDICATOR 6.5 (GLOVE) ×2
GLOVE BIOGEL PI INDICATOR 7.5 (GLOVE) ×1
GLOVE ECLIPSE 6.5 STRL STRAW (GLOVE) ×1 IMPLANT
GOWN STRL NON-REIN LRG LVL3 (GOWN DISPOSABLE) ×4 IMPLANT
KIT BASIN OR (CUSTOM PROCEDURE TRAY) ×2 IMPLANT
KIT ROOM TURNOVER OR (KITS) ×2 IMPLANT
NDL 18GX1X1/2 (RX/OR ONLY) (NEEDLE) ×1 IMPLANT
NDL HYPO 25GX1X1/2 BEV (NEEDLE) ×1 IMPLANT
NEEDLE 18GX1X1/2 (RX/OR ONLY) (NEEDLE) ×2 IMPLANT
NEEDLE HYPO 25GX1X1/2 BEV (NEEDLE) ×2 IMPLANT
NS IRRIG 1000ML POUR BTL (IV SOLUTION) ×2 IMPLANT
PACK SURGICAL SETUP 50X90 (CUSTOM PROCEDURE TRAY) ×2 IMPLANT
PAD ARMBOARD 7.5X6 YLW CONV (MISCELLANEOUS) ×4 IMPLANT
SOAP 2 % CHG 4 OZ (WOUND CARE) ×2 IMPLANT
SPONGE GAUZE 2X2 STER 10/PKG (GAUZE/BANDAGES/DRESSINGS) ×1
SUT ETHILON 3 0 PS 1 (SUTURE) ×2 IMPLANT
SUT MNCRL AB 4-0 PS2 18 (SUTURE) ×2 IMPLANT
SYR 20CC LL (SYRINGE) ×4 IMPLANT
SYR 30ML LL (SYRINGE) IMPLANT
SYR 3ML LL SCALE MARK (SYRINGE) ×2 IMPLANT
SYR 5ML LL (SYRINGE) ×2 IMPLANT
SYR CONTROL 10ML LL (SYRINGE) ×2 IMPLANT
SYRINGE 10CC LL (SYRINGE) ×2 IMPLANT
TAPE CLOTH SURG 4X10 WHT LF (GAUZE/BANDAGES/DRESSINGS) ×1 IMPLANT
TOWEL OR 17X24 6PK STRL BLUE (TOWEL DISPOSABLE) ×2 IMPLANT
TOWEL OR 17X26 10 PK STRL BLUE (TOWEL DISPOSABLE) ×2 IMPLANT
WATER STERILE IRR 1000ML POUR (IV SOLUTION) ×2 IMPLANT
WIRE AMPLATZ SS-J .035X180CM (WIRE) ×1 IMPLANT

## 2012-02-26 NOTE — Anesthesia Preprocedure Evaluation (Addendum)
Anesthesia Evaluation  Patient identified by MRN, date of birth, ID band Patient awake    Reviewed: Allergy & Precautions, H&P , NPO status , Patient's Chart, lab work & pertinent test results  History of Anesthesia Complications Negative for: history of anesthetic complications  Airway Mallampati: II TM Distance: >3 FB Neck ROM: Full    Dental  (+) Poor Dentition, Loose, Missing, Chipped and Dental Advisory Given   Pulmonary COPD clear to auscultation        Cardiovascular hypertension, + dysrhythmias + Valvular Problems/Murmurs MVP Regular Normal    Neuro/Psych Negative Neurological ROS     GI/Hepatic   Endo/Other  Diabetes mellitus-  Renal/GU ESRF and DialysisRenal disease     Musculoskeletal   Abdominal (+) obese,   Peds  Hematology   Anesthesia Other Findings   Reproductive/Obstetrics                         Anesthesia Physical Anesthesia Plan  ASA: III  Anesthesia Plan: MAC   Post-op Pain Management:    Induction: Intravenous  Airway Management Planned: Simple Face Mask and Natural Airway  Additional Equipment:   Intra-op Plan:   Post-operative Plan:   Informed Consent: I have reviewed the patients History and Physical, chart, labs and discussed the procedure including the risks, benefits and alternatives for the proposed anesthesia with the patient or authorized representative who has indicated his/her understanding and acceptance.     Plan Discussed with: CRNA and Surgeon  Anesthesia Plan Comments:         Anesthesia Quick Evaluation

## 2012-02-26 NOTE — Preoperative (Signed)
Beta Blockers   Reason not to administer Beta Blockers:Not Applicable 

## 2012-02-26 NOTE — Op Note (Signed)
OPERATIVE NOTE  PROCEDURE: 1. Right femoral vein tunneled dialysis catheter exchange  PRE-OPERATIVE DIAGNOSIS: end-stage renal failure  POST-OPERATIVE DIAGNOSIS: same as above  SURGEON: [name], MD  ANESTHESIA: local and IV sedation  ESTIMATED BLOOD LOSS: minimal  FINDING(S): 1.  Tips of the catheter in the right atrium on fluoroscopy  SPECIMEN(S):  none  INDICATIONS:   [name] is a 78 y.o. female who  presents with exposed right femoral tunneled dialysis catheter cuff.  This patient has a prosthetic left femoral vein femoral to popliteal bypass, so placement of a left tunneled dialysis catheter is no advisable.  The patient presents for femoral tunneled dialysis catheter exchange.  The patient is aware the risks of tunneled dialysis catheter exchange include but are not limited to: bleeding, infection, central venous injury, possible venous stenosis, possible malpositioning in the venous system, and possible infections related to long-term catheter presence.  The patient was aware of these risks and agreed to proceed.  DESCRIPTION: After written full informed consent was obtained from the patient, the patient was taken back to the operating room.  Prior to induction, the patient was given IV antibiotics.  After obtaining adequate sedation, the patient was prepped and draped in the standard fashion for a femoral vein tunneled dialysis catheter exchange.  I anesthesized the subctuaneous tissue surrounding the tunneled dialysis catheter cuff with with a total of 6 cc of a 1:1 mixture of 0.5% Marcaine without epinepherine and 1% Lidocaine with epinepherine.  I then dissected out the cuff bluntly and sharply released the cuff.  I then clamped the exposed portion of the catheter adjacent to the cuff and sharply transected the distal portion of the catheter.  This distal portion was pushed out of the surgical field to prevent contamination of the field.  I then clamped one lumen with a  hemostat and then loaded an Amplatz wire through the remaining lumen.  Under fluoroscopy, the wire was advanced into the right atrium.  The catheter was removed over the wire.  A new 55-cm Diatek catheter was woven over the wire and advanced into the right atrium, under fluoroscopic guidance.  The wire was then removed under fluoroscopic guidance.  The metal dissector was connected to the back end of this catheter and the catheter hub loaded onto the catheter.  The back end of this catheter was transected, revealing the two lumens of this catheter.  The ports were docked onto these two lumens.  The catheter hub was then screwed into place.  Each port was tested by aspirating and flushing.  No resistance was noted.  Each port was then thoroughly flushed with heparinized saline.  The catheter was secured in placed with two interrupted stitches of 3-0 Nylon tied to the catheter.  The neck incision was closed with a U-stitch of 4-0 Monocryl.  The neck and chest incision were cleaned and sterile bandages applied.  Each port was then loaded with concentrated heparin (1000 Units/mL) at the manufacturer recommended volumes to each port.  Sterile caps were applied to each port.  On completion fluoroscopy, the tips of the catheter were in the right atrium, and there was no evidence of pneumothorax.  COMPLICATIONS: none  CONDITION: stable  [name], MD [facility] [place] Office: [phone] Pager: [phone]  02/26/2012, 10:31 AM  10:31 AM

## 2012-02-26 NOTE — Transfer of Care (Signed)
Immediate Anesthesia Transfer of Care Note  Patient: [name]  Procedure(s) Performed: Procedure(s) (LRB): EXCHANGE OF A DIALYSIS CATHETER (N/A)  Patient Location: PACU  Anesthesia Type: General  Level of Consciousness: awake, alert  and oriented  Airway & Oxygen Therapy: Patient Spontanous Breathing and Patient connected to face mask oxygen  Post-op Assessment: Report given to PACU RN, Post -op Vital signs reviewed and stable and Patient moving all extremities X 4  Post vital signs: Reviewed and stable  Complications: No apparent anesthesia complications

## 2012-02-26 NOTE — Anesthesia Postprocedure Evaluation (Signed)
  Anesthesia Post-op Note  Patient: [name]  Procedure(s) Performed: Procedure(s) (LRB): EXCHANGE OF A DIALYSIS CATHETER (N/A)  Patient Location: PACU  Anesthesia Type: MAC  Level of Consciousness: awake  Airway and Oxygen Therapy: Patient Spontanous Breathing  Post-op Pain: none  Post-op Assessment: Post-op Vital signs reviewed  Post-op Vital Signs: stable  Complications: No apparent anesthesia complications
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
## 2012-02-26 NOTE — Progress Notes (Signed)
Kayexalate 15mg /60 ml (total of 60mg ) oral suspension sent home with patient per order with instructions for usage.

## 2012-04-09 ENCOUNTER — Telehealth: Payer: Self-pay

## 2012-04-09 DIAGNOSIS — M79669 Pain in unspecified lower leg: Secondary | ICD-10-CM

## 2012-04-09 DIAGNOSIS — I739 Peripheral vascular disease, unspecified: Secondary | ICD-10-CM

## 2012-04-09 NOTE — Telephone Encounter (Signed)
Pt. called to request appt. for "sharp pain in left leg that comes and goes", both with activity and at rest.  States pain is located in mid-portion of left lower leg, in front, along bone.  States also has pain in foot that comes and goes.  C/o coolness in left foot.  Denies heaviness in left leg. Denies numbness or tingling.  States left leg is "a little bit swollen". States pain started on Saturday.  Denies any open sores. Next appt. 04/29/12.  Stated "I don't think I can wait that long".  Will discuss w/ Dr. Scot Dock. Per verbal order Dr. Scot Dock, schedule left LE arterial duplex and ABI's tomorrow. If vascular study normal, then pt. Can f/u with Dr. Kellie Simmering at regular appt. time on 4/30, and if abnormal, pt. Can be eval. Per office MD tomorrow.

## 2012-04-10 ENCOUNTER — Inpatient Hospital Stay (HOSPITAL_COMMUNITY)
Admission: AD | Admit: 2012-04-10 | Discharge: 2012-04-17 | DRG: 252 | Disposition: A | Discharge status: Home / Self Care | Payer: Medicare Other | Source: Ambulatory Visit | Attending: Vascular Surgery | Admitting: Vascular Surgery

## 2012-04-10 ENCOUNTER — Encounter (INDEPENDENT_AMBULATORY_CARE_PROVIDER_SITE_OTHER): Payer: Medicare Other | Admitting: *Deleted

## 2012-04-10 ENCOUNTER — Encounter: Payer: Self-pay | Admitting: Vascular Surgery

## 2012-04-10 ENCOUNTER — Ambulatory Visit (INDEPENDENT_AMBULATORY_CARE_PROVIDER_SITE_OTHER): Payer: Medicare Other | Admitting: Vascular Surgery

## 2012-04-10 VITALS — BP 153/70 | HR 57 | Resp 18 | Ht <= 58 in | Wt 143.0 lb

## 2012-04-10 DIAGNOSIS — M109 Gout, unspecified: Secondary | ICD-10-CM | POA: Diagnosis present

## 2012-04-10 DIAGNOSIS — I10 Essential (primary) hypertension: Secondary | ICD-10-CM

## 2012-04-10 DIAGNOSIS — E785 Hyperlipidemia, unspecified: Secondary | ICD-10-CM | POA: Diagnosis present

## 2012-04-10 DIAGNOSIS — Z992 Dependence on renal dialysis: Secondary | ICD-10-CM

## 2012-04-10 DIAGNOSIS — T82898A Other specified complication of vascular prosthetic devices, implants and grafts, initial encounter: Principal | ICD-10-CM | POA: Diagnosis present

## 2012-04-10 DIAGNOSIS — I70219 Atherosclerosis of native arteries of extremities with intermittent claudication, unspecified extremity: Secondary | ICD-10-CM

## 2012-04-10 DIAGNOSIS — R000 Tachycardia, unspecified: Secondary | ICD-10-CM

## 2012-04-10 DIAGNOSIS — D649 Anemia, unspecified: Secondary | ICD-10-CM | POA: Diagnosis not present

## 2012-04-10 DIAGNOSIS — E119 Type 2 diabetes mellitus without complications: Secondary | ICD-10-CM | POA: Diagnosis present

## 2012-04-10 DIAGNOSIS — N186 End stage renal disease: Secondary | ICD-10-CM | POA: Diagnosis present

## 2012-04-10 DIAGNOSIS — I050 Rheumatic mitral stenosis: Secondary | ICD-10-CM | POA: Diagnosis present

## 2012-04-10 DIAGNOSIS — J449 Chronic obstructive pulmonary disease, unspecified: Secondary | ICD-10-CM | POA: Diagnosis present

## 2012-04-10 DIAGNOSIS — I739 Peripheral vascular disease, unspecified: Secondary | ICD-10-CM

## 2012-04-10 DIAGNOSIS — I120 Hypertensive chronic kidney disease with stage 5 chronic kidney disease or end stage renal disease: Secondary | ICD-10-CM | POA: Diagnosis present

## 2012-04-10 DIAGNOSIS — Y832 Surgical operation with anastomosis, bypass or graft as the cause of abnormal reaction of the patient, or of later complication, without mention of misadventure at the time of the procedure: Secondary | ICD-10-CM | POA: Diagnosis present

## 2012-04-10 DIAGNOSIS — M79669 Pain in unspecified lower leg: Secondary | ICD-10-CM

## 2012-04-10 DIAGNOSIS — Z79899 Other long term (current) drug therapy: Secondary | ICD-10-CM

## 2012-04-10 DIAGNOSIS — J4489 Other specified chronic obstructive pulmonary disease: Secondary | ICD-10-CM | POA: Diagnosis present

## 2012-04-10 DIAGNOSIS — Z48812 Encounter for surgical aftercare following surgery on the circulatory system: Secondary | ICD-10-CM

## 2012-04-10 DIAGNOSIS — Z87891 Personal history of nicotine dependence: Secondary | ICD-10-CM

## 2012-04-10 DIAGNOSIS — M129 Arthropathy, unspecified: Secondary | ICD-10-CM | POA: Diagnosis present

## 2012-04-10 DIAGNOSIS — N2581 Secondary hyperparathyroidism of renal origin: Secondary | ICD-10-CM | POA: Diagnosis present

## 2012-04-10 DIAGNOSIS — K5900 Constipation, unspecified: Secondary | ICD-10-CM | POA: Diagnosis not present

## 2012-04-10 DIAGNOSIS — S98139A Complete traumatic amputation of one unspecified lesser toe, initial encounter: Secondary | ICD-10-CM

## 2012-04-10 DIAGNOSIS — I70269 Atherosclerosis of native arteries of extremities with gangrene, unspecified extremity: Secondary | ICD-10-CM | POA: Insufficient documentation

## 2012-04-10 DIAGNOSIS — I498 Other specified cardiac arrhythmias: Secondary | ICD-10-CM | POA: Diagnosis not present

## 2012-04-10 LAB — CBC
HCT: 38.5 % (ref 36.0–46.0)
Hemoglobin: 12.7 g/dL (ref 12.0–15.0)
MCH: 32.8 pg (ref 26.0–34.0)
RBC: 3.87 MIL/uL (ref 3.87–5.11)

## 2012-04-10 LAB — COMPREHENSIVE METABOLIC PANEL
ALT: 13 U/L (ref 0–35)
Alkaline Phosphatase: 77 U/L (ref 39–117)
BUN: 61 mg/dL — ABNORMAL HIGH (ref 6–23)
CO2: 19 mEq/L (ref 19–32)
GFR calc Af Amer: 4 mL/min — ABNORMAL LOW (ref >90)
GFR calc non Af Amer: 4 mL/min — ABNORMAL LOW (ref >90)
Glucose, Bld: 135 mg/dL — ABNORMAL HIGH (ref 70–99)
Potassium: 6.2 mEq/L — ABNORMAL HIGH (ref 3.5–5.1)
Sodium: 134 mEq/L — ABNORMAL LOW (ref 135–145)

## 2012-04-10 MED ORDER — DEXTROSE 5 % IV SOLN
1.5000 g | INTRAVENOUS | Status: AC
  Administered 2012-04-11: 1.5 g via INTRAVENOUS
  Filled 2012-04-10: qty 1.5

## 2012-04-10 MED ORDER — ACETAMINOPHEN 325 MG PO TABS: 325.0000 mg | ORAL_TABLET | ORAL | Status: DC | PRN

## 2012-04-10 MED ORDER — ONDANSETRON HCL 4 MG/2ML IJ SOLN: 4.0000 mg | Freq: Four times a day (QID) | INTRAMUSCULAR | Status: DC | PRN

## 2012-04-10 MED ORDER — METOPROLOL TARTRATE 1 MG/ML IV SOLN: 2.0000 mg | INTRAVENOUS | Status: DC | PRN

## 2012-04-10 MED ORDER — PHENOL 1.4 % MT LIQD
1.0000 | OROMUCOSAL | Status: DC | PRN
  Filled 2012-04-10: qty 177

## 2012-04-10 MED ORDER — PANTOPRAZOLE SODIUM 40 MG PO TBEC: 40.0000 mg | DELAYED_RELEASE_TABLET | Freq: Every day | ORAL | Status: DC

## 2012-04-10 MED ORDER — HEPARIN (PORCINE) IN NACL 100-0.45 UNIT/ML-% IJ SOLN
800.0000 [IU]/h | INTRAMUSCULAR | Status: DC
  Filled 2012-04-10: qty 250

## 2012-04-10 MED ORDER — HEPARIN (PORCINE) IN NACL 100-0.45 UNIT/ML-% IJ SOLN
800.0000 [IU]/h | INTRAMUSCULAR | Status: DC
  Administered 2012-04-10: 800 [IU]/h via INTRAVENOUS
  Filled 2012-04-10: qty 250

## 2012-04-10 MED ORDER — PANTOPRAZOLE SODIUM 40 MG PO TBEC
40.0000 mg | DELAYED_RELEASE_TABLET | Freq: Every day | ORAL | Status: DC
  Administered 2012-04-10 – 2012-04-17 (×7): 40 mg via ORAL
  Filled 2012-04-10 (×7): qty 1

## 2012-04-10 MED ORDER — CLONIDINE HCL 0.2 MG PO TABS
0.2000 mg | ORAL_TABLET | Freq: Two times a day (BID) | ORAL | Status: DC
  Administered 2012-04-10: 0.2 mg via ORAL
  Filled 2012-04-10 (×3): qty 1

## 2012-04-10 MED ORDER — LABETALOL HCL 5 MG/ML IV SOLN
10.0000 mg | INTRAVENOUS | Status: DC | PRN
  Filled 2012-04-10: qty 4

## 2012-04-10 MED ORDER — SODIUM CHLORIDE 0.9 % IV SOLN
INTRAVENOUS | Status: DC
  Administered 2012-04-10 – 2012-04-11 (×3): via INTRAVENOUS

## 2012-04-10 MED ORDER — GUAIFENESIN-DM 100-10 MG/5ML PO SYRP: 15.0000 mL | ORAL_SOLUTION | ORAL | Status: DC | PRN

## 2012-04-10 MED ORDER — ACETAMINOPHEN 650 MG RE SUPP: 325.0000 mg | RECTAL | Status: DC | PRN

## 2012-04-10 MED ORDER — SODIUM CHLORIDE 0.9 % IV SOLN: INTRAVENOUS | Status: DC

## 2012-04-10 MED ORDER — HYDRALAZINE HCL 20 MG/ML IJ SOLN
10.0000 mg | INTRAMUSCULAR | Status: DC | PRN
  Filled 2012-04-10: qty 0.5

## 2012-04-10 MED ORDER — POTASSIUM CHLORIDE CRYS ER 20 MEQ PO TBCR
20.0000 meq | EXTENDED_RELEASE_TABLET | Freq: Once | ORAL | Status: DC
  Filled 2012-04-10: qty 1

## 2012-04-10 MED ORDER — DOCUSATE SODIUM 100 MG PO CAPS: 100.0000 mg | ORAL_CAPSULE | Freq: Two times a day (BID) | ORAL | Status: DC

## 2012-04-10 MED ORDER — OXYCODONE HCL 5 MG PO TABS: 5.0000 mg | ORAL_TABLET | ORAL | Status: DC | PRN

## 2012-04-10 MED ORDER — HEPARIN BOLUS VIA INFUSION
3000.0000 [IU] | Freq: Once | INTRAVENOUS | Status: AC
  Administered 2012-04-10: 3000 [IU] via INTRAVENOUS
  Filled 2012-04-10: qty 3000

## 2012-04-10 MED ORDER — SENNOSIDES-DOCUSATE SODIUM 8.6-50 MG PO TABS
1.0000 | ORAL_TABLET | Freq: Every evening | ORAL | Status: DC | PRN
  Filled 2012-04-10: qty 1

## 2012-04-10 MED ORDER — CALCIUM ACETATE 667 MG PO CAPS
667.0000 mg | ORAL_CAPSULE | Freq: Three times a day (TID) | ORAL | Status: DC
  Administered 2012-04-12 – 2012-04-14 (×6): 667 mg via ORAL
  Filled 2012-04-10 (×14): qty 1

## 2012-04-10 MED ORDER — DOCUSATE SODIUM 100 MG PO CAPS
100.0000 mg | ORAL_CAPSULE | Freq: Two times a day (BID) | ORAL | Status: DC
  Administered 2012-04-10: 100 mg via ORAL
  Filled 2012-04-10 (×3): qty 1

## 2012-04-10 NOTE — Progress Notes (Signed)
ANTICOAGULATION CONSULT NOTE - Initial Consult  Pharmacy Consult for heparin Indication: Occlusion of left femoropopliteal bypass    Allergies  Allergen Reactions  . Ace Inhibitors Other (See Comments)    Reaction unknown    Patient Measurements: Height: 4\' 10"  (147.3 cm) Weight: 143 lb (64.864 kg) IBW/kg (Calculated) : 40.9  Heparin Dosing Weight:65kg  Vital Signs: Temp: 97.8 F (36.6 C) (04/11 1903) Temp src: Oral (04/11 1903) BP: 196/80 mmHg (04/11 1903) Pulse Rate: 71  (04/11 1903)  Labs: No results found for this basename: HGB:2,HCT:3,PLT:3,APTT:3,LABPROT:3,INR:3,HEPARINUNFRC:3,CREATININE:3,CKTOTAL:3,CKMB:3,TROPONINI:3 in the last 72 hours Estimated Creatinine Clearance: 4.8 ml/min (by C-G formula based on Cr of 8.03).  Medical History: Past Medical History  Diagnosis Date  . Unspecified essential hypertension   . Hyperpotassemia   . Other specified cardiac dysrhythmias   . Difficulty in walking   . Gout, unspecified   . Type II or unspecified type diabetes mellitus without mention of complication, not stated as uncontrolled   . Mild mitral valve stenosis   . Arthritis   . Joint pain   . Leg pain   . Thyroid disease   . Gangrene     left fifth toe  . Peripheral vascular disease   . Anemia   . Hyperlipidemia   . Aortic valve sclerosis   . Shoulder fracture, right   . End stage renal disease     dialysis 02/25/12  . Blood transfusion     Medications:  Prescriptions prior to admission  Medication Sig Dispense Refill  . amLODipine (NORVASC) 10 MG tablet Take 10 mg by mouth daily.      . calcium acetate (PHOSLO) 667 MG capsule Take 667 mg by mouth 3 (three) times daily with meals.      . cloNIDine (CATAPRES) 0.2 MG tablet Take 0.2 mg by mouth 2 (two) times daily.          Assessment: 78 year old female with bypass graft performed last June is admitted after found to have occluded left femoropopliteal bypass by dopplers at office visit today. Will start  IV heparin with plans for patient to go to IR for thrombolysis of her graft tomorrow. Noted plans to not discontinue heparin prior to procedure. Patient is also end stage renal with HD on mwf.   Goal of Therapy:  Heparin level 0.3-0.7 units/ml   Plan:  Heparin bolus of 3000 units x1  Heparin drip at 800 units/hr Daily CBC/HL Georgina Peer 04/10/2012,7:40 PM

## 2012-04-10 NOTE — Progress Notes (Signed)
Patient is 78 year old female who previously underwent a left femoral to below-knee popliteal bypass with Gore-Tex by Dr. Kellie Simmering in June of 2012. 5 days ago she noticed pain in her left calf on the anterior aspect. Over the last 24-48 hours she is also started experiencing some pain in her left foot. She has end-stage renal disease patient in [place] on Monday Wednesday and Friday via a right femoral Diatek catheter. Her completion arteriogram showed two-vessel runoff via the peroneal and posterior tibial artery in June. The bypass was initially done for a wound of her left fifth toe. She an amputation of this and this is well-healed.  Past Medical History  Diagnosis Date  . Unspecified essential hypertension   . Hyperpotassemia   . Other specified cardiac dysrhythmias   . Difficulty in walking   . Gout, unspecified   . Type II or unspecified type diabetes mellitus without mention of complication, not stated as uncontrolled   . Mild mitral valve stenosis   . Arthritis   . Joint pain   . Leg pain   . Thyroid disease   . Gangrene     left fifth toe  . Peripheral vascular disease   . Anemia   . Hyperlipidemia   . Aortic valve sclerosis   . Shoulder fracture, right   . End stage renal disease     dialysis 02/25/12  . Blood transfusion     Past Surgical History  Procedure Date  . Av fistula repair     left  . Av fistula repair     right arm fistula failed  . Total abdominal hysterectomy     one ovary removed  . Arteriovenous graft placement     left femoral loop arteriovenous Gore-Tex graft.  . Av fistula placement   . Amputation 06/22/11    metatarsal amp  . Pr vein bypass graft,aorto-fem-pop 06/14/2011  . Toe amputation 2012    Left 5th toe    Current Outpatient Prescriptions on File Prior to Visit  Medication Sig Dispense Refill  . cloNIDine (CATAPRES) 0.2 MG tablet Take 0.2 mg by mouth 2 (two) times daily.         No current facility-administered medications on file  prior to visit.   Allergies  Allergen Reactions  . Ace Inhibitors     Review of systems: She denies shortness of breath, she denies chest pain  Data: Patient had a graft duplex exam today which shows a left femoropopliteal bypass is occluded. She had absent Doppler flow in the posterior tibial dorsalis pedis with absent digit pressure right-sided ABI showed calcified vessels with monophasic to biphasic flow and a toe pressure of 89.  I reviewed and interpreted this study  Assessment: Occlusion of left femoropopliteal bypass with rest pain left foot. The bypass occlusion seems fairly acute.  Plan: The patient will be admitted to the hospital today and placed on heparin drip. I spoke with Dr. Kathlene Cote with interventional radiology and we will try to do thrombolysis of her bypass graft starting tomorrow. She does have a right femoral Diatek catheter which complicates this slightly. However hopefully we've can get sheath into her right femoral artery and do the lysis and get the bypass going again. If not, we will at least obtain lower extremity runoff views to see whether or not she is a candidate to have the bypass redone. I spoke with Dr. Kellie Simmering regarding admission of this patient and he will followup with her study tomorrow. If she needs revision of  the bypass this most likely will followup to Dr. Bridgett Larsson next week since Dr. Kellie Simmering is out of town next week.  Foley the problem will be resolved with thrombolysis alone.  In speaking with Dr. Kathlene Cote suggested that we keep a heparin drip going and that this did not need to be discontinued prior to her thrombolysis procedure.  Ruta Hinds, MD [facility] [place] Office: [phone] Pager: [phone]

## 2012-04-11 ENCOUNTER — Inpatient Hospital Stay (HOSPITAL_COMMUNITY): Payer: Medicare Other

## 2012-04-11 ENCOUNTER — Inpatient Hospital Stay (HOSPITAL_COMMUNITY): Payer: Medicare Other | Admitting: Certified Registered"

## 2012-04-11 ENCOUNTER — Encounter (HOSPITAL_COMMUNITY): Payer: Self-pay | Admitting: *Deleted

## 2012-04-11 ENCOUNTER — Encounter (HOSPITAL_COMMUNITY)
Admission: AD | Disposition: A | Discharge status: Home / Self Care | Payer: Self-pay | Source: Ambulatory Visit | Attending: Vascular Surgery

## 2012-04-11 ENCOUNTER — Encounter (HOSPITAL_COMMUNITY): Payer: Self-pay | Admitting: Certified Registered"

## 2012-04-11 DIAGNOSIS — T82898A Other specified complication of vascular prosthetic devices, implants and grafts, initial encounter: Secondary | ICD-10-CM

## 2012-04-11 DIAGNOSIS — I743 Embolism and thrombosis of arteries of the lower extremities: Secondary | ICD-10-CM

## 2012-04-11 HISTORY — PX: FEMORAL-POPLITEAL BYPASS GRAFT: SHX937

## 2012-04-11 LAB — SURGICAL PCR SCREEN
MRSA, PCR: NEGATIVE
Staphylococcus aureus: NEGATIVE

## 2012-04-11 LAB — BASIC METABOLIC PANEL
CO2: 16 mEq/L — ABNORMAL LOW (ref 19–32)
Chloride: 99 mEq/L (ref 96–112)
Glucose, Bld: 181 mg/dL — ABNORMAL HIGH (ref 70–99)
Potassium: 5.3 mEq/L — ABNORMAL HIGH (ref 3.5–5.1)
Sodium: 133 mEq/L — ABNORMAL LOW (ref 135–145)

## 2012-04-11 LAB — CBC
HCT: 37.8 % (ref 36.0–46.0)
Hemoglobin: 12.2 g/dL (ref 12.0–15.0)
MCHC: 32.3 g/dL (ref 30.0–36.0)
MCV: 99.7 fL (ref 78.0–100.0)

## 2012-04-11 LAB — HEPARIN LEVEL (UNFRACTIONATED): Heparin Unfractionated: 0.63 IU/mL (ref 0.30–0.70)

## 2012-04-11 LAB — GLUCOSE, CAPILLARY: Glucose-Capillary: 161 mg/dL — ABNORMAL HIGH (ref 70–99)

## 2012-04-11 SURGERY — BYPASS GRAFT FEMORAL-POPLITEAL ARTERY
Anesthesia: General | Site: Leg Upper | Laterality: Left | Wound class: Clean

## 2012-04-11 MED ORDER — METOPROLOL TARTRATE 1 MG/ML IV SOLN: 2.0000 mg | INTRAVENOUS | Status: DC | PRN

## 2012-04-11 MED ORDER — SODIUM CHLORIDE 0.9 % IV SOLN: 100.0000 mL | INTRAVENOUS | Status: DC | PRN

## 2012-04-11 MED ORDER — ACETAMINOPHEN 325 MG PO TABS: 325.0000 mg | ORAL_TABLET | ORAL | Status: DC | PRN

## 2012-04-11 MED ORDER — HYDROMORPHONE HCL PF 1 MG/ML IJ SOLN
0.2500 mg | INTRAMUSCULAR | Status: DC | PRN
  Administered 2012-04-11 (×3): 0.5 mg via INTRAVENOUS

## 2012-04-11 MED ORDER — PHENOL 1.4 % MT LIQD: 1.0000 | OROMUCOSAL | Status: DC | PRN

## 2012-04-11 MED ORDER — DEXTROSE 5 % IV SOLN: 1.5000 g | INTRAVENOUS | Status: DC

## 2012-04-11 MED ORDER — MIDAZOLAM HCL 5 MG/5ML IJ SOLN
INTRAMUSCULAR | Status: DC | PRN
  Administered 2012-04-11: 1 mg via INTRAVENOUS

## 2012-04-11 MED ORDER — DOCUSATE SODIUM 100 MG PO CAPS
100.0000 mg | ORAL_CAPSULE | Freq: Every day | ORAL | Status: DC
  Administered 2012-04-12 – 2012-04-17 (×6): 100 mg via ORAL
  Filled 2012-04-11 (×6): qty 1

## 2012-04-11 MED ORDER — ACETAMINOPHEN 650 MG RE SUPP: 325.0000 mg | RECTAL | Status: DC | PRN

## 2012-04-11 MED ORDER — OXYCODONE HCL 5 MG PO TABS
5.0000 mg | ORAL_TABLET | ORAL | Status: DC | PRN
  Administered 2012-04-12 (×2): 5 mg via ORAL
  Administered 2012-04-12: 10 mg via ORAL
  Administered 2012-04-12: 5 mg via ORAL
  Administered 2012-04-14: 10 mg via ORAL
  Administered 2012-04-14: 5 mg via ORAL
  Administered 2012-04-14 – 2012-04-15 (×3): 10 mg via ORAL
  Filled 2012-04-11: qty 1
  Filled 2012-04-11: qty 2
  Filled 2012-04-11: qty 1
  Filled 2012-04-11 (×3): qty 2
  Filled 2012-04-11 (×2): qty 1
  Filled 2012-04-11: qty 2

## 2012-04-11 MED ORDER — RENA-VITE PO TABS
1.0000 | ORAL_TABLET | Freq: Every day | ORAL | Status: DC
  Administered 2012-04-12 – 2012-04-16 (×5): 1 via ORAL
  Filled 2012-04-11 (×8): qty 1

## 2012-04-11 MED ORDER — POTASSIUM CHLORIDE CRYS ER 20 MEQ PO TBCR: 20.0000 meq | EXTENDED_RELEASE_TABLET | Freq: Once | ORAL | Status: DC | PRN

## 2012-04-11 MED ORDER — CLONIDINE HCL 0.1 MG PO TABS
0.1000 mg | ORAL_TABLET | Freq: Two times a day (BID) | ORAL | Status: DC
Start: 2012-04-11 — End: 2012-04-14
  Administered 2012-04-12 – 2012-04-13 (×4): 0.1 mg via ORAL
  Filled 2012-04-11 (×8): qty 1

## 2012-04-11 MED ORDER — AMLODIPINE BESYLATE 10 MG PO TABS: 10.0000 mg | ORAL_TABLET | ORAL | Status: DC

## 2012-04-11 MED ORDER — ALTEPLASE 2 MG IJ SOLR
2.0000 mg | Freq: Once | INTRAMUSCULAR | Status: AC | PRN
  Filled 2012-04-11: qty 2

## 2012-04-11 MED ORDER — DEXTROSE 5 % IV SOLN
1.5000 g | Freq: Two times a day (BID) | INTRAVENOUS | Status: AC
  Administered 2012-04-11 – 2012-04-12 (×2): 1.5 g via INTRAVENOUS
  Filled 2012-04-11 (×2): qty 1.5

## 2012-04-11 MED ORDER — ROCURONIUM BROMIDE 100 MG/10ML IV SOLN
INTRAVENOUS | Status: DC | PRN
  Administered 2012-04-11: 30 mg via INTRAVENOUS

## 2012-04-11 MED ORDER — ONDANSETRON HCL 4 MG/2ML IJ SOLN
INTRAMUSCULAR | Status: DC | PRN
  Administered 2012-04-11: 4 mg via INTRAVENOUS

## 2012-04-11 MED ORDER — 0.9 % SODIUM CHLORIDE (POUR BTL) OPTIME
TOPICAL | Status: DC | PRN
  Administered 2012-04-11: 2000 mL

## 2012-04-11 MED ORDER — LABETALOL HCL 5 MG/ML IV SOLN
INTRAVENOUS | Status: DC | PRN
  Administered 2012-04-11 (×2): 5 mg via INTRAVENOUS

## 2012-04-11 MED ORDER — ONDANSETRON HCL 4 MG/2ML IJ SOLN
4.0000 mg | Freq: Four times a day (QID) | INTRAMUSCULAR | Status: DC | PRN
  Administered 2012-04-11: 4 mg via INTRAVENOUS

## 2012-04-11 MED ORDER — SODIUM CHLORIDE 0.9 % IV SOLN: 500.0000 mL | Freq: Once | INTRAVENOUS | Status: AC | PRN

## 2012-04-11 MED ORDER — MORPHINE SULFATE 2 MG/ML IJ SOLN
2.0000 mg | INTRAMUSCULAR | Status: DC | PRN
  Administered 2012-04-12: 4 mg via INTRAVENOUS
  Filled 2012-04-11: qty 2

## 2012-04-11 MED ORDER — SENNOSIDES-DOCUSATE SODIUM 8.6-50 MG PO TABS
1.0000 | ORAL_TABLET | Freq: Every evening | ORAL | Status: DC | PRN
  Administered 2012-04-16: 1 via ORAL
  Filled 2012-04-11: qty 1

## 2012-04-11 MED ORDER — ONDANSETRON HCL 4 MG/2ML IJ SOLN
4.0000 mg | INTRAMUSCULAR | Status: DC | PRN
  Administered 2012-04-11 – 2012-04-15 (×4): 4 mg via INTRAVENOUS
  Filled 2012-04-11 (×6): qty 2

## 2012-04-11 MED ORDER — HYDROMORPHONE HCL PF 1 MG/ML IJ SOLN
INTRAMUSCULAR | Status: AC
  Filled 2012-04-11: qty 1

## 2012-04-11 MED ORDER — METOPROLOL SUCCINATE ER 50 MG PO TB24
50.0000 mg | ORAL_TABLET | Freq: Every day | ORAL | Status: DC
  Administered 2012-04-12 – 2012-04-17 (×6): 50 mg via ORAL
  Filled 2012-04-11 (×6): qty 1

## 2012-04-11 MED ORDER — AMLODIPINE BESYLATE 10 MG PO TABS
10.0000 mg | ORAL_TABLET | Freq: Every day | ORAL | Status: DC
  Administered 2012-04-12 – 2012-04-17 (×6): 10 mg via ORAL
  Filled 2012-04-11 (×7): qty 1

## 2012-04-11 MED ORDER — EPHEDRINE SULFATE 50 MG/ML IJ SOLN
INTRAMUSCULAR | Status: DC | PRN
  Administered 2012-04-11: 10 mg via INTRAVENOUS

## 2012-04-11 MED ORDER — GUAIFENESIN-DM 100-10 MG/5ML PO SYRP: 15.0000 mL | ORAL_SOLUTION | ORAL | Status: DC | PRN

## 2012-04-11 MED ORDER — BISACODYL 5 MG PO TBEC
5.0000 mg | DELAYED_RELEASE_TABLET | Freq: Every day | ORAL | Status: DC | PRN
  Administered 2012-04-14 – 2012-04-15 (×2): 5 mg via ORAL
  Filled 2012-04-11 (×2): qty 1

## 2012-04-11 MED ORDER — LIDOCAINE HCL (CARDIAC) 20 MG/ML IV SOLN
INTRAVENOUS | Status: DC | PRN
  Administered 2012-04-11: 40 mg via INTRAVENOUS

## 2012-04-11 MED ORDER — SODIUM CHLORIDE 0.9 % IR SOLN
Status: DC | PRN
  Administered 2012-04-11: 14:00:00

## 2012-04-11 MED ORDER — LABETALOL HCL 5 MG/ML IV SOLN
10.0000 mg | INTRAVENOUS | Status: DC | PRN
  Administered 2012-04-12: 10 mg via INTRAVENOUS
  Filled 2012-04-11 (×2): qty 4

## 2012-04-11 MED ORDER — SODIUM CHLORIDE 0.9 % IV SOLN
62.5000 mg | INTRAVENOUS | Status: DC
  Administered 2012-04-14: 62.5 mg via INTRAVENOUS
  Filled 2012-04-11: qty 5

## 2012-04-11 MED ORDER — SODIUM CHLORIDE 0.9 % IV SOLN
INTRAVENOUS | Status: DC
Start: 2012-04-11 — End: 2012-04-11

## 2012-04-11 MED ORDER — IOHEXOL 300 MG/ML  SOLN
INTRAMUSCULAR | Status: DC | PRN
  Administered 2012-04-11: 50 mL via INTRA_ARTERIAL

## 2012-04-11 MED ORDER — PROTAMINE SULFATE 10 MG/ML IV SOLN
INTRAVENOUS | Status: DC | PRN
  Administered 2012-04-11: 50 mg via INTRAVENOUS

## 2012-04-11 MED ORDER — PARICALCITOL 5 MCG/ML IV SOLN
3.0000 ug | INTRAVENOUS | Status: DC
  Administered 2012-04-12 – 2012-04-16 (×3): 3 ug via INTRAVENOUS
  Filled 2012-04-11 (×3): qty 0.6

## 2012-04-11 MED ORDER — MAGNESIUM SULFATE 40 MG/ML IJ SOLN
2.0000 g | Freq: Once | INTRAMUSCULAR | Status: AC | PRN
  Filled 2012-04-11: qty 50

## 2012-04-11 MED ORDER — PROPOFOL 10 MG/ML IV EMUL
INTRAVENOUS | Status: DC | PRN
  Administered 2012-04-11: 50 mg via INTRAVENOUS
  Administered 2012-04-11: 90 mg via INTRAVENOUS
  Administered 2012-04-11: 60 mg via INTRAVENOUS

## 2012-04-11 MED ORDER — DOPAMINE-DEXTROSE 3.2-5 MG/ML-% IV SOLN: 3.0000 ug/kg/min | INTRAVENOUS | Status: DC

## 2012-04-11 MED ORDER — FENTANYL CITRATE 0.05 MG/ML IJ SOLN
INTRAMUSCULAR | Status: DC | PRN
  Administered 2012-04-11: 50 ug via INTRAVENOUS
  Administered 2012-04-11: 100 ug via INTRAVENOUS
  Administered 2012-04-11 (×3): 50 ug via INTRAVENOUS

## 2012-04-11 MED ORDER — HYDRALAZINE HCL 20 MG/ML IJ SOLN
10.0000 mg | INTRAMUSCULAR | Status: DC | PRN
  Filled 2012-04-11: qty 0.5

## 2012-04-11 MED ORDER — HEPARIN SODIUM (PORCINE) 1000 UNIT/ML DIALYSIS
1000.0000 [IU] | INTRAMUSCULAR | Status: DC | PRN
  Filled 2012-04-11: qty 1

## 2012-04-11 MED ORDER — HEPARIN SODIUM (PORCINE) 1000 UNIT/ML IJ SOLN
INTRAMUSCULAR | Status: DC | PRN
  Administered 2012-04-11: 5000 [IU] via INTRAVENOUS
  Administered 2012-04-11: 2000 [IU] via INTRAVENOUS

## 2012-04-11 SURGICAL SUPPLY — 66 items
ADH SKN CLS APL DERMABOND .7 (GAUZE/BANDAGES/DRESSINGS) ×2
BANDAGE ESMARK 6X9 LF (GAUZE/BANDAGES/DRESSINGS) IMPLANT
BNDG CMPR 9X6 STRL LF SNTH (GAUZE/BANDAGES/DRESSINGS)
BNDG ESMARK 6X9 LF (GAUZE/BANDAGES/DRESSINGS)
BOOT SUTURE AID YELLOW STND (SUTURE) IMPLANT
CANISTER SUCTION 2500CC (MISCELLANEOUS) ×2 IMPLANT
CATH EMB 3FR 80CM (CATHETERS) ×1 IMPLANT
CATH EMB 4FR 80CM (CATHETERS) ×2 IMPLANT
CLIP TI MEDIUM 24 (CLIP) ×2 IMPLANT
CLIP TI WIDE RED SMALL 24 (CLIP) ×2 IMPLANT
CLOTH BEACON ORANGE TIMEOUT ST (SAFETY) ×2 IMPLANT
COVER SURGICAL LIGHT HANDLE (MISCELLANEOUS) ×4 IMPLANT
DECANTER SPIKE VIAL GLASS SM (MISCELLANEOUS) IMPLANT
DERMABOND ADVANCED (GAUZE/BANDAGES/DRESSINGS) ×2
DERMABOND ADVANCED .7 DNX12 (GAUZE/BANDAGES/DRESSINGS) ×1 IMPLANT
DRAIN CHANNEL 10M FLAT 3/4 FLT (DRAIN) ×1 IMPLANT
DRAIN SNY 10X20 3/4 PERF (WOUND CARE) IMPLANT
DRAPE WARM FLUID 44X44 (DRAPE) ×2 IMPLANT
DRAPE X-RAY CASS 24X20 (DRAPES) ×1 IMPLANT
ELECT REM PT RETURN 9FT ADLT (ELECTROSURGICAL) ×2
ELECTRODE REM PT RTRN 9FT ADLT (ELECTROSURGICAL) ×1 IMPLANT
EVACUATOR SILICONE 100CC (DRAIN) ×1 IMPLANT
GLOVE BIO SURGEON STRL SZ7.5 (GLOVE) ×1 IMPLANT
GLOVE BIOGEL PI IND STRL 7.0 (GLOVE) IMPLANT
GLOVE BIOGEL PI IND STRL 7.5 (GLOVE) IMPLANT
GLOVE BIOGEL PI INDICATOR 7.0 (GLOVE) ×4
GLOVE BIOGEL PI INDICATOR 7.5 (GLOVE) ×4
GLOVE SS BIOGEL STRL SZ 7 (GLOVE) ×1 IMPLANT
GLOVE SUPERSENSE BIOGEL SZ 7 (GLOVE) ×3
GLOVE SURG SS PI 7.5 STRL IVOR (GLOVE) ×2 IMPLANT
GOWN PREVENTION PLUS XLARGE (GOWN DISPOSABLE) ×1 IMPLANT
GOWN STRL NON-REIN LRG LVL3 (GOWN DISPOSABLE) ×5 IMPLANT
GOWN STRL REIN XL XLG (GOWN DISPOSABLE) ×1 IMPLANT
INSERT FOGARTY SM (MISCELLANEOUS) ×2 IMPLANT
KIT BASIN OR (CUSTOM PROCEDURE TRAY) ×2 IMPLANT
KIT ROOM TURNOVER OR (KITS) ×2 IMPLANT
NS IRRIG 1000ML POUR BTL (IV SOLUTION) ×4 IMPLANT
PACK PERIPHERAL VASCULAR (CUSTOM PROCEDURE TRAY) ×2 IMPLANT
PAD ARMBOARD 7.5X6 YLW CONV (MISCELLANEOUS) ×4 IMPLANT
PADDING CAST COTTON 6X4 STRL (CAST SUPPLIES) IMPLANT
PATCH HEMASHIELD 8X75 (Vascular Products) ×1 IMPLANT
SET COLLECT BLD 21X3/4 12 (NEEDLE) IMPLANT
SLEEVE SURGEON STRL (DRAPES) ×1 IMPLANT
SPONGE GAUZE 4X4 12PLY (GAUZE/BANDAGES/DRESSINGS) ×1 IMPLANT
SPONGE LAP 18X18 X RAY DECT (DISPOSABLE) ×1 IMPLANT
STOPCOCK 4 WAY LG BORE MALE ST (IV SETS) ×1 IMPLANT
SUT PROLENE 6 0 BV (SUTURE) IMPLANT
SUT PROLENE 6 0 CC (SUTURE) ×7 IMPLANT
SUT PROLENE 7 0 BV 1 (SUTURE) IMPLANT
SUT PROLENE 7 0 BV1 MDA (SUTURE) IMPLANT
SUT SILK 2 0 SH (SUTURE) ×2 IMPLANT
SUT SILK 3 0 (SUTURE)
SUT SILK 3-0 18XBRD TIE 12 (SUTURE) IMPLANT
SUT VIC AB 2-0 CTX 36 (SUTURE) ×4 IMPLANT
SUT VIC AB 3-0 SH 27 (SUTURE) ×4
SUT VIC AB 3-0 SH 27X BRD (SUTURE) ×2 IMPLANT
SYR 30ML LL (SYRINGE) ×1 IMPLANT
SYR 3ML LL SCALE MARK (SYRINGE) ×1 IMPLANT
SYR TB 1ML LUER SLIP (SYRINGE) ×1 IMPLANT
TAPE CLOTH SURG 4X10 WHT LF (GAUZE/BANDAGES/DRESSINGS) ×1 IMPLANT
TOWEL OR 17X24 6PK STRL BLUE (TOWEL DISPOSABLE) ×4 IMPLANT
TOWEL OR 17X26 10 PK STRL BLUE (TOWEL DISPOSABLE) ×4 IMPLANT
TRAY FOLEY CATH 14FRSI W/METER (CATHETERS) ×2 IMPLANT
TUBING EXTENTION W/L.L. (IV SETS) ×1 IMPLANT
UNDERPAD 30X30 INCONTINENT (UNDERPADS AND DIAPERS) ×2 IMPLANT
WATER STERILE IRR 1000ML POUR (IV SOLUTION) ×2 IMPLANT

## 2012-04-11 NOTE — Progress Notes (Signed)
ANTICOAGULATION CONSULT NOTE - Follow Up Consult  Pharmacy Consult for heparin Indication: Occlusion of left femoropopliteal bypass    Allergies  Allergen Reactions  . Ace Inhibitors Other (See Comments)    Reaction unknown    Labs:  [facility] 04/11/12 0715 04/10/12 2047  HGB 12.2 12.7  HCT 37.8 38.5  PLT 225 267  APTT -- --  LABPROT -- 13.7  INR -- 1.03  HEPARINUNFRC 0.63 --  CREATININE -- 9.11*  CKTOTAL -- --  CKMB -- --  TROPONINI -- --   Estimated Creatinine Clearance: 4.2 ml/min (by C-G formula based on Cr of 9.11).  Assessment: 78 yo female admitted with occluded bypass graft started on IV heparin 04/10/12 with plans for thrombolysis today. Initial heparin level was at goal ([id number]) No complications noted. The heparin has now been stopped, will follow peripherally as needed.  Goal of Therapy:  Heparin level 0.3-0.7 units/ml   Plan:  Follow peripherally as needed. Labs have been d/c'ed  Georgina Peer 04/11/2012,8:19 AM

## 2012-04-11 NOTE — Interval H&P Note (Signed)
History and Physical Interval Note:  04/11/2012 11:44 AM  [name]  has presented today for surgery, with the diagnosis of clotted graft  The various methods of treatment have been discussed with the patient and family. After consideration of risks, benefits and other options for treatment, the patient has consented to  Procedure(s) (LRB): BYPASS GRAFT FEMORAL-POPLITEAL ARTERY (Left) as a surgical intervention .  The patients' history has been reviewed, patient examined, no change in status, stable for surgery.  I have reviewed the patients' chart and labs.  Questions were answered to the patient's satisfaction.     [name]

## 2012-04-11 NOTE — Op Note (Addendum)
OPERATIVE REPORT  Date of Surgery: 04/10/2012 - 04/11/2012  Surgeon: [name], MD  Assistant: Gerri Lins PA  Pre-op Diagnosis: Occluded left femoral-popliteal bypass graftwith ischemia left leg Post-op Diagnosis: Occluded left femoral-popliteal bypass graftwith the ischemia left leg  Procedure: Procedure(s): #1 thrombectomy left femoral popliteal bypass graft-Gore-Tex #2 revision proximal and with shortening of graft #3 intraoperative arteriogram left leg #4 endarterectomy and patch angioplasty of distal anastomosis the popliteal artery  Anesthesia: General  EBL: [id number] cc  Complications: None  Procedure Details:patient was taken to the operating room placed in the supine position at which time satisfactory general endotracheal anesthesia was administered. The left leg was prepped with Betadine scrub and solution draped in a sterile manner. He was laid in area through the previous scar. Previous Gore-Tex femoral-popliteal graft was identified and dissected proximally. It was encased with dense scar tissue. It was noted as the graft was dissected free that there were significant kinking of the graft in this proximal area where the scar tissue had bound up the graft. This was all mobilized and there was redundancy in the length of the graft. Was also noted there was a pulse in the proximal portion of the graft. Grafted just thrombosed within the last several days. Patient was heparinized and transverse opening made in the graft about 5 cm distal to the anastomosis. A 4 Fogarty catheter was passed proximally up into the aorta there was no evidence of any proximal narrowing there was excellent inflow present. There was thrombus in the graft however and Fogarty was then passed distally it would go to the ankle level upon return abundant thrombus from the graft itself was removed followed by good backbleeding. Additional passes with the Fogarty both 3 and 4 Fogarty catheters would go to the  ankle level. There seemed to be some hangup of the distal anastomosis. Following multiple negative passes the graft and the proximal one was shortened by about centimeters in the proximal primary end to end anastomosis performed with 6-0 Prolene. Intraoperative arteriogram was then performed which revealed some concentric narrowing at the distal anastomosis but otherwise good runoff through the anterior tibial artery and peroneal arteries. Medial incision was made below the native vessel anastomosis was exposed and the scar tissue. Proximal control the popliteal artery and distal control at the level of the tibial peroneal trunk was obtained. A longitudinal opening was made in the hood of the Gore-Tex graft and did down into the native popliteal artery. There was some intima which was hypertrophied in this area and endarterectomy was necessary. There was also a small amount of retained thrombus. After this was all removed under direct vision the background patch was sewn into place using continuous 6-0 Prolene. When this was completed and clamps released there was an excellent pulse in the graft the distal anterior tibial artery at the ankle. Protamine was given to reverse the heparin following studies. Saline a Jackson-Pratt drain was brought out through a proximal stab wound draining the distal wound wound closed in layers with Vicryl as a reticular fashion with Dermabond in both the groin and calf wound. Sterile dressings applied patient taken to recovery room in stable condition .   [name], MD 04/11/2012 2:36 PM    Surgeon Dr. [name] First Asst. Dr. Gae Gallop and Gerri Lins PA

## 2012-04-11 NOTE — Anesthesia Preprocedure Evaluation (Signed)
Anesthesia Evaluation  Patient identified by MRN, date of birth, ID band Patient awake    Reviewed: Allergy & Precautions, H&P , NPO status , Patient's Chart, lab work & pertinent test results  Airway Mallampati: II TM Distance: >3 FB Neck ROM: Full    Dental No notable dental hx. (+) Teeth Intact, Poor Dentition, Loose, Chipped and Dental Advisory Given   Pulmonary COPD breath sounds clear to auscultation  Pulmonary exam normal       Cardiovascular hypertension, On Medications + dysrhythmias Rhythm:Regular Rate:Normal     Neuro/Psych negative neurological ROS  negative psych ROS   GI/Hepatic negative GI ROS, Neg liver ROS,   Endo/Other  Diabetes mellitus-, Well Controlled  Renal/GU CRF and DialysisRenal disease  negative genitourinary   Musculoskeletal   Abdominal   Peds  Hematology negative hematology ROS (+)   Anesthesia Other Findings   Reproductive/Obstetrics negative OB ROS                           Anesthesia Physical Anesthesia Plan  ASA: III  Anesthesia Plan: General   Post-op Pain Management:    Induction: Intravenous  Airway Management Planned: Oral ETT  Additional Equipment:   Intra-op Plan:   Post-operative Plan: Extubation in OR  Informed Consent: I have reviewed the patients History and Physical, chart, labs and discussed the procedure including the risks, benefits and alternatives for the proposed anesthesia with the patient or authorized representative who has indicated his/her understanding and acceptance.   Dental advisory given  Plan Discussed with: CRNA and Surgeon  Anesthesia Plan Comments:         Anesthesia Quick Evaluation

## 2012-04-11 NOTE — Transfer of Care (Signed)
Immediate Anesthesia Transfer of Care Note  Patient: [name]  Procedure(s) Performed: Procedure(s) (LRB): BYPASS GRAFT FEMORAL-POPLITEAL ARTERY (Left)  Patient Location: PACU  Anesthesia Type: General  Level of Consciousness: awake  Airway & Oxygen Therapy: Patient Spontanous Breathing and Patient connected to nasal cannula oxygen  Post-op Assessment: Report given to PACU RN, Post -op Vital signs reviewed and stable and Patient moving all extremities  Post vital signs: Reviewed and stable  Complications: No apparent anesthesia complications

## 2012-04-11 NOTE — H&P (View-Only) (Signed)
Patient ID: [name], female   DOB: 05-04-1934, 78 y.o.   MRN: [id number] Vascular Surgery Progress Note  Subjective: Patient with occluded left femoral-popliteal bypass graft placed in June of 2012-sounds as if bypass has been occluded for at least one week as symptoms started last Saturday. She continues to complaint of an aching discomfort in the left pretibial region and left foot. Unlikely that thrombolysis will be satisfactory. Objective:  Filed Vitals:   04/11/12 0930  BP: 169/69  Pulse: 56  Temp:   Resp: 12    General alert and oriented x3 Left foot cool and pale with positive motion and sensation 2+ left femoral pulse   Labs:  Lab 04/10/12 2047  CREATININE 9.11*    Lab 04/10/12 2047  NA 134*  K 6.2*  CL 96  CO2 19  BUN 61*  CREATININE 9.11*  LABGLOM --  GLUCOSE 135*  CALCIUM 9.8    Lab 04/11/12 0715 04/10/12 2047  WBC 6.1 7.1  HGB 12.2 12.7  HCT 37.8 38.5  PLT 225 267    Lab 04/10/12 2047  INR 1.03    I/O last 3 completed shifts: In: 146.5 [I.V.:146.5] Out: -   Imaging: Dg Chest 2 View  04/11/2012  *RADIOLOGY REPORT*  Clinical Data: 78 year old female preoperative study, left shoulder pain, diabetes, thyroid disease, peripheral vascular disease.  CHEST - 2 VIEW  Comparison: 01/09/2010.  Findings: Inferior approach dual lumen dialysis type catheter. Catheter tips project at the level of the right atrium.   Stable cardiac size and mediastinal contours.  Calcified atherosclerosis. Visualized tracheal air column is within normal limits.  No pneumothorax, pulmonary edema, pleural effusion or confluent pulmonary opacity. No acute osseous abnormality identified.  IMPRESSION: 1. No acute cardiopulmonary abnormality. 2.  Inferior approach dual lumen dialysis type catheter in place.  Original Report Authenticated By: Randall An, M.D.    Assessment/Plan: Plan hemodialysis this morning because of potassium of 6.2 and we'll intake patient to OR for  attempted thrombectomy left femoral popliteal bypass and or possible revision or replacement Have discussed with patient bad prognosis because of early occlusion of synthetic graft and no availability of the vein which is adequate. This may well lead to left leg amputation but only chance of limb salvage would be attempted revascularization The patient accepts risks and would like to proceed today   [name], MD 04/11/2012 10:31 AM

## 2012-04-11 NOTE — Consult Note (Signed)
[facility] Renal Consultation Note  Indication for Consultation:  Management of ESRD/hemodialysis; anemia, hypertension/volume and secondary hyperparathyroidism  HPI: [name] is a 78 y.o. female admiited  Left foot pain and patient had a graft duplex exam today which shows a left femoropopliteal bypass is  Occluded. Dr. Oneida Alar admitted to the hospital  and placed on heparin drip. He spoke with Dr. Kathlene Cote with interventional radiology and he will try to do thrombolysis of her bypass graft today. She does have a right femoral Diatek catheter which complicates this slightly.  Now on Hemodialysis, foot pain slightly better telling me "been gaining to much fluid before HD".    Dialysis Orders: Center: [facility]  on mwf . EDW 62.5 HD Bath k 2.0, ca 2.25  Time 3 hrs 77min Heparin standard. Access right fem. perm cath BFR  400 DFR  800    Zemplar 3 mcg IV/HD Epogen 0 last hgb 13.1 on 04/02/12   Units IV/HD  Venofer   50 per wk.  Other 0    Past Medical History  Diagnosis Date  . Unspecified essential hypertension   . Hyperpotassemia   . Other specified cardiac dysrhythmias   . Difficulty in walking   . Gout, unspecified   . Type II or unspecified type diabetes mellitus without mention of complication, not stated as uncontrolled   . Mild mitral valve stenosis   . Arthritis   . Joint pain   . Leg pain   . Thyroid disease   . Gangrene     left fifth toe  . Peripheral vascular disease   . Anemia   . Hyperlipidemia   . Aortic valve sclerosis   . Shoulder fracture, right   . End stage renal disease     dialysis 02/25/12  . Blood transfusion     Past Surgical History  Procedure Date  . Av fistula repair     left  . Av fistula repair     right arm fistula failed  . Total abdominal hysterectomy     one ovary removed  . Arteriovenous graft placement     left femoral loop arteriovenous Gore-Tex graft.  . Av fistula placement   . Amputation 06/22/11    metatarsal amp    . Pr vein bypass graft,aorto-fem-pop 06/14/2011  . Toe amputation 2012    Left 5th toe      Family History  Problem Relation Age of Onset  . Peripheral vascular disease Mother       reports that she quit smoking about 23 years ago. Her smoking use included Cigarettes. She has never used smokeless tobacco. She reports that she does not drink alcohol or use illicit drugs. Lives with Husband , " who has Pancreatic CA on chemotherapy, grandaugther and grandchild.   Allergies  Allergen Reactions  . Ace Inhibitors Other (See Comments)    Reaction unknown    Prior to Admission medications   Medication Sig Start Date End Date Taking? Authorizing Provider  amLODipine (NORVASC) 10 MG tablet Take 10 mg by mouth daily.   Yes Historical Provider, MD  calcium acetate (PHOSLO) 667 MG capsule Take 667 mg by mouth 3 (three) times daily with meals.   Yes Historical Provider, MD  cloNIDine (CATAPRES) 0.2 MG tablet Take 0.2 mg by mouth 2 (two) times daily.     Yes Historical Provider, MD     Anti-infectives     Start     Dose/Rate Route Frequency Ordered Stop   04/11/12 [phone]  cefUROXime (ZINACEF) 1.5 g in dextrose 5 % 50 mL IVPB  Status:  Discontinued        1.5 g 100 mL/hr over 30 Minutes Intravenous On call to O.R. 04/11/12 0757 04/11/12 0759   04/11/12 0700   cefUROXime (ZINACEF) 1.5 g in dextrose 5 % 50 mL IVPB        1.5 g 100 mL/hr over 30 Minutes Intravenous On call 04/10/12 1908 04/12/12 0700          Results for orders placed during the hospital encounter of 04/10/12 (from the past 48 hour(s))  AMYLASE     Status: Normal   Collection Time   04/10/12  8:47 PM      Component Value Range Comment   Amylase 64  0 - 105 (U/L)   CBC     Status: Normal   Collection Time   04/10/12  8:47 PM      Component Value Range Comment   WBC 7.1  4.0 - 10.5 (K/uL)    RBC 3.87  3.87 - 5.11 (MIL/uL)    Hemoglobin 12.7  12.0 - 15.0 (g/dL)    HCT 38.5  36.0 - 46.0 (%)    MCV 99.5  78.0 - 100.0  (fL)    MCH 32.8  26.0 - 34.0 (pg)    MCHC 33.0  30.0 - 36.0 (g/dL)    RDW 14.4  11.5 - 15.5 (%)    Platelets 267  150 - 400 (K/uL)   COMPREHENSIVE METABOLIC PANEL     Status: Abnormal   Collection Time   04/10/12  8:47 PM      Component Value Range Comment   Sodium 134 (*) 135 - 145 (mEq/L)    Potassium 6.2 (*) 3.5 - 5.1 (mEq/L)    Chloride 96  96 - 112 (mEq/L)    CO2 19  19 - 32 (mEq/L)    Glucose, Bld 135 (*) 70 - 99 (mg/dL)    BUN 61 (*) 6 - 23 (mg/dL)    Creatinine, Ser 9.11 (*) 0.50 - 1.10 (mg/dL)    Calcium 9.8  8.4 - 10.5 (mg/dL)    Total Protein 7.6  6.0 - 8.3 (g/dL)    Albumin 3.7  3.5 - 5.2 (g/dL)    AST 11  0 - 37 (U/L)    ALT 13  0 - 35 (U/L)    Alkaline Phosphatase 77  39 - 117 (U/L)    Total Bilirubin 0.1 (*) 0.3 - 1.2 (mg/dL)    GFR calc non Af Amer 4 (*) >90 (mL/min)    GFR calc Af Amer 4 (*) >90 (mL/min)   LIPASE, BLOOD     Status: Normal   Collection Time   04/10/12  8:47 PM      Component Value Range Comment   Lipase 35  11 - 59 (U/L)   PROTIME-INR     Status: Normal   Collection Time   04/10/12  8:47 PM      Component Value Range Comment   Prothrombin Time 13.7  11.6 - 15.2 (seconds)    INR 1.03  0.00 - 1.49    HEPARIN LEVEL (UNFRACTIONATED)     Status: Normal   Collection Time   04/11/12  7:15 AM      Component Value Range Comment   Heparin Unfractionated 0.63  0.30 - 0.70 (IU/mL)   CBC     Status: Abnormal   Collection Time   04/11/12  7:15 AM  Component Value Range Comment   WBC 6.1  4.0 - 10.5 (K/uL)    RBC 3.79 (*) 3.87 - 5.11 (MIL/uL)    Hemoglobin 12.2  12.0 - 15.0 (g/dL)    HCT 37.8  36.0 - 46.0 (%)    MCV 99.7  78.0 - 100.0 (fL)    MCH 32.2  26.0 - 34.0 (pg)    MCHC 32.3  30.0 - 36.0 (g/dL)    RDW 14.4  11.5 - 15.5 (%)    Platelets 225  150 - 400 (K/uL)    EKG: normal EKG, normal sinus rhythm, unchanged from previous tracings, RBBB.  ROS: left foot and leg pain progressing, mild constipation" when I don't eat rigth''.  No other  positives reported on ros.  Physical Exam: Filed Vitals:   04/11/12 0425  BP: 172/69  Pulse: 66  Temp: 97.5 F (36.4 C)  Resp: 14     General: Alert BF , NAD, NO sob , appropriate , ox3 HEENT: , mmm Eyes: nonicteric Neck: supple Heart: RRR, no rub ,no murmur Lungs: CTA, no rales or wheezing Abdomen: soft, nontender Extremities: trace bipedal edema Skin:sl. Cool left foot, no ulcers Neuro: no acute focal deficits Dialysis Access: patent right fem. Perm cath  Dialysis Orders: Center: [facility] on [facility]wf . EDW 62.5 HD Bath k 2.0, ca 2.25 Time 3 hrs 101min Heparin standard. Access right fem. perm cath BFR 400 DFR 800 Zemplar 3 mcg IV/HD Epogen 0 last hgb 13.1 on 04/02/12 Units IV/HD Venofer 50 per wk. Other 0   Assessment/Plan 1. Occlusion of left femoropopliteal bypass with rest pain left foot.= per [facility] to go to OR 2. ESRD -  MWF at [facility]. NOT ON [place] > will d/c. Needs acute HD today for elevated K+, pt going to OR at noon per [facility]. Will do short HD this am acutely with low K bath.  3. Hypertension/volume- attempt volume removal slowly edw 62.5/ today 66 kg and hypertensive on hd. Per med list at Dialysis= On Norvasc 10mg  hs and Metoprolo 50mg  at home ? Compliance . She admitts to missing meds. Taper down clonidine and vol off with hd  And use norvasc and metoprolol 4. Anemia  -hgb 12.2 no epo for now weekly venofer 5. Metabolic bone disease -  zemplar 72mcg and Phoslo binders/ 9.8 ca, alb 3.7 6. Nutrition -  Supplement as needed, alb 3.7 7. HO copd/ asthma= prn inhaler  8. DM TYPE 2- no insulin  Ernest Haber, PA-C [place] [phone] 04/11/2012, 9:18 AM   Patient seen and examined and agree with assessment and plan as above.  Kelly Splinter  MD [facility] [phone] pgr    [phone] cell 04/11/2012, 4:17 PM

## 2012-04-11 NOTE — Anesthesia Postprocedure Evaluation (Signed)
  Anesthesia Post-op Note  Patient: [name]  Procedure(s) Performed: Procedure(s) (LRB): BYPASS GRAFT FEMORAL-POPLITEAL ARTERY (Left)  Patient Location: PACU  Anesthesia Type: General  Level of Consciousness: awake  Airway and Oxygen Therapy: Patient Spontanous Breathing and Patient connected to nasal cannula oxygen  Post-op Pain: moderate  Post-op Assessment: Post-op Vital signs reviewed, Patient's Cardiovascular Status Stable, Respiratory Function Stable and Patent Airway  Post-op Vital Signs: Reviewed and stable  Complications: No apparent anesthesia complications

## 2012-04-11 NOTE — Progress Notes (Signed)
Patient ID: [name], female   DOB: July 15, 1934, 78 y.o.   MRN: [id number] Vascular Surgery Progress Note  Subjective: Patient with occluded left femoral-popliteal bypass graft placed in June of 2012-sounds as if bypass has been occluded for at least one week as symptoms started last Saturday. She continues to complaint of an aching discomfort in the left pretibial region and left foot. Unlikely that thrombolysis will be satisfactory. Objective:  Filed Vitals:   04/11/12 0930  BP: 169/69  Pulse: 56  Temp:   Resp: 12    General alert and oriented x3 Left foot cool and pale with positive motion and sensation 2+ left femoral pulse   Labs:  Lab 04/10/12 2047  CREATININE 9.11*    Lab 04/10/12 2047  NA 134*  K 6.2*  CL 96  CO2 19  BUN 61*  CREATININE 9.11*  LABGLOM --  GLUCOSE 135*  CALCIUM 9.8    Lab 04/11/12 0715 04/10/12 2047  WBC 6.1 7.1  HGB 12.2 12.7  HCT 37.8 38.5  PLT 225 267    Lab 04/10/12 2047  INR 1.03    I/O last 3 completed shifts: In: 146.5 [I.V.:146.5] Out: -   Imaging: Dg Chest 2 View  04/11/2012  *RADIOLOGY REPORT*  Clinical Data: 77 year old female preoperative study, left shoulder pain, diabetes, thyroid disease, peripheral vascular disease.  CHEST - 2 VIEW  Comparison: 01/09/2010.  Findings: Inferior approach dual lumen dialysis type catheter. Catheter tips project at the level of the right atrium.   Stable cardiac size and mediastinal contours.  Calcified atherosclerosis. Visualized tracheal air column is within normal limits.  No pneumothorax, pulmonary edema, pleural effusion or confluent pulmonary opacity. No acute osseous abnormality identified.  IMPRESSION: 1. No acute cardiopulmonary abnormality. 2.  Inferior approach dual lumen dialysis type catheter in place.  Original Report Authenticated By: Randall An, M.D.    Assessment/Plan: Plan hemodialysis this morning because of potassium of 6.2 and we'll intake patient to OR for  attempted thrombectomy left femoral popliteal bypass and or possible revision or replacement Have discussed with patient bad prognosis because of early occlusion of synthetic graft and no availability of the vein which is adequate. This may well lead to left leg amputation but only chance of limb salvage would be attempted revascularization The patient accepts risks and would like to proceed today   [name], MD 04/11/2012 10:31 AM

## 2012-04-11 NOTE — Progress Notes (Signed)
Utilization review completed. Rozanna Boer, RN, BSN. 04/11/12

## 2012-04-12 ENCOUNTER — Inpatient Hospital Stay (HOSPITAL_COMMUNITY): Payer: Medicare Other

## 2012-04-12 LAB — RENAL FUNCTION PANEL
Albumin: 3.2 g/dL — ABNORMAL LOW (ref 3.5–5.2)
BUN: 48 mg/dL — ABNORMAL HIGH (ref 6–23)
Chloride: 93 mEq/L — ABNORMAL LOW (ref 96–112)
GFR calc Af Amer: 5 mL/min — ABNORMAL LOW (ref >90)
Glucose, Bld: 127 mg/dL — ABNORMAL HIGH (ref 70–99)
Potassium: 5.7 mEq/L — ABNORMAL HIGH (ref 3.5–5.1)
Sodium: 132 mEq/L — ABNORMAL LOW (ref 135–145)

## 2012-04-12 LAB — CBC
HCT: 33.1 % — ABNORMAL LOW (ref 36.0–46.0)
Hemoglobin: 10.8 g/dL — ABNORMAL LOW (ref 12.0–15.0)
RDW: 14.5 % (ref 11.5–15.5)
WBC: 6.4 10*3/uL (ref 4.0–10.5)

## 2012-04-12 LAB — GLUCOSE, CAPILLARY: Glucose-Capillary: 117 mg/dL — ABNORMAL HIGH (ref 70–99)

## 2012-04-12 MED ORDER — PARICALCITOL 5 MCG/ML IV SOLN
INTRAVENOUS | Status: AC
  Administered 2012-04-12: 3 ug via INTRAVENOUS
  Filled 2012-04-12: qty 1

## 2012-04-12 NOTE — Progress Notes (Addendum)
[facility] [place]  Progress Note Bypass Surgery  Date of Surgery: 04/10/2012 - 04/11/2012  Procedure(s): BYPASS GRAFT FEMORAL-POPLITEAL ARTERY Surgeon: Surgeon(s): [name], MD Angelia Mould, MD  1 Day Post-Op  History of Present Illness  [name] is a 78 y.o. female who is S/P Procedure(s): BYPASS GRAFT FEMORAL-POPLITEAL ARTERY left.  The patient's pre-op symptoms of pain are Improved . Patients pain is well controlled.  Patient has been nauseous and vomited last night.  VASC. LAB Studies:      pending;   Imaging: Dg Chest 2 View  04/11/2012  *RADIOLOGY REPORT*  Clinical Data: 78 year old female preoperative study, left shoulder pain, diabetes, thyroid disease, peripheral vascular disease.  CHEST - 2 VIEW  Comparison: 01/09/2010.  Findings: Inferior approach dual lumen dialysis type catheter. Catheter tips project at the level of the right atrium.   Stable cardiac size and mediastinal contours.  Calcified atherosclerosis. Visualized tracheal air column is within normal limits.  No pneumothorax, pulmonary edema, pleural effusion or confluent pulmonary opacity. No acute osseous abnormality identified.  IMPRESSION: 1. No acute cardiopulmonary abnormality. 2.  Inferior approach dual lumen dialysis type catheter in place.  Original Report Authenticated By: Randall An, M.D.   Dg Ang/ext/uni/or Left  04/11/2012  *RADIOLOGY REPORT*  Clinical Data: Left leg arteriogram  LEFT ANG/EXT/UNI/ OR  Comparison:  None.  Findings: Femoral arterial bypass graft is patent.  There is narrowing at the distal anastomosis to the popliteal artery below the knee.  Anterior tibial artery and peroneal artery are patent to the ankle.  Posterior tibial artery is diminutive and it is occluded just above the ankle.  IMPRESSION: Left leg arteriogram as described.  Original Report Authenticated By: Jamas Lav, M.D.    Significant Diagnostic Studies: CBC Lab Results   Component Value Date   WBC 6.1 04/11/2012   HGB 11.3* 04/11/2012   HCT 34.7* 04/11/2012   MCV 99.7 04/11/2012   PLT 225 04/11/2012    BMET     Component Value Date/Time   NA 133* 04/11/2012 1700   K 5.3* 04/11/2012 1700   CL 99 04/11/2012 1700   CO2 16* 04/11/2012 1700   GLUCOSE 181* 04/11/2012 1700   BUN 32* 04/11/2012 1700   CREATININE 6.33* 04/11/2012 1700   CALCIUM 8.5 04/11/2012 1700   GFRNONAA 6* 04/11/2012 1700   GFRAA 7* 04/11/2012 1700    COAG Lab Results  Component Value Date   INR 1.03 04/10/2012   INR 1.03 06/04/2011   INR 1.00 08/03/2010   No results found for this basename: PTT    Physical Examination  BP Readings from Last 3 Encounters:  04/12/12 159/54  04/12/12 159/54  04/10/12 153/70   Temp Readings from Last 3 Encounters:  04/12/12 97.6 F (36.4 C) Oral  04/12/12 97.6 F (36.4 C) Oral  02/26/12 98 F (36.7 C) Oral   SpO2 Readings from Last 3 Encounters:  04/12/12 100%  04/12/12 100%  02/26/12 100%   Pulse Readings from Last 3 Encounters:  04/12/12 73  04/12/12 73  04/10/12 57    Pt is A&O x 3 left lower extremity: Incision/s is/are clean,dry.intact, and  healing without hematoma, erythema with blake drain intact Limb is warm; with good color  Left Dorsalis Pedis pulse is monophasic by Doppler LeftPosterior tibial pulse is  monophasic by Doppler    Assessment/Plan: Pt. Doing well Post-op pain is controlled Wounds are healing well PT/OT for ambulation Continue wound care as ordered Plan  to transfer to 2000 in am. [name] [name] [phone] 04/12/2012 6:51 AM   Agree with above Palpable dp  [name]

## 2012-04-12 NOTE — Progress Notes (Signed)
Pt. Being transferred to 2029 after receiving diaylsis today. Phone report has been given to [place], Warehouse manager. Family members as well as patient are aware of the trnsfer

## 2012-04-12 NOTE — Progress Notes (Signed)
Subjective:  Alert, no distress.   Objective:    Vital signs in last 24 hours: Filed Vitals:   04/12/12 0400 04/12/12 0800 04/12/12 0934 04/12/12 0936  BP:  195/67 186/67 186/67  Pulse:    76  Temp:  97.9 F (36.6 C)    TempSrc:  Oral    Resp:      Height:      Weight: 64.3 kg (141 lb 12.1 oz)     SpO2:       Weight change: 1.136 kg (2 lb 8.1 oz)  Intake/Output Summary (Last 24 hours) at 04/12/12 1238 Last data filed at 04/12/12 0830  Gross per 24 hour  Intake   1010 ml  Output    205 ml  Net    805 ml   Labs: Basic Metabolic Panel:  Lab [id number] 1700 04/10/12 2047  NA 133* 134*  K 5.3* 6.2*  CL 99 96  CO2 16* 19  GLUCOSE 181* 135*  BUN 32* 61*  CREATININE 6.33* 9.11*  ALB -- --  CALCIUM 8.5 9.8  PHOS -- --   Liver Function Tests:  Lab 04/10/12 2047  AST 11  ALT 13  ALKPHOS 77  BILITOT 0.1*  PROT 7.6  ALBUMIN 3.7    Lab 04/10/12 2047  LIPASE 35  AMYLASE 64   No results found for this basename: AMMONIA:3 in the last 168 hours CBC:  Lab 04/11/12 1510 04/11/12 0715 04/10/12 2047  WBC -- 6.1 7.1  NEUTROABS -- -- --  HGB 11.3* 12.2 12.7  HCT 34.7* 37.8 38.5  MCV -- 99.7 99.5  PLT -- 225 267   Cardiac Enzymes: No results found for this basename: CKTOTAL:5,CKMB:5,CKMBINDEX:5,TROPONINI:5 in the last 168 hours CBG:  Lab 04/12/12 1207 04/12/12 0811 04/11/12 2117 04/11/12 1455  GLUCAP 121* 117* 161* 171*    Iron Studies: No results found for this basename: IRON:30,TIBC:30,SATURATION RATIOS:30,TRANSFERRIN:30,FERRITIN:30 in the last 168 hours Studies/Results: Dg Chest 2 View  04/11/2012  *RADIOLOGY REPORT*  Clinical Data: 78 year old female preoperative study, left shoulder pain, diabetes, thyroid disease, peripheral vascular disease.  CHEST - 2 VIEW  Comparison: 01/09/2010.  Findings: Inferior approach dual lumen dialysis type catheter. Catheter tips project at the level of the right atrium.   Stable cardiac size and mediastinal contours.  Calcified  atherosclerosis. Visualized tracheal air column is within normal limits.  No pneumothorax, pulmonary edema, pleural effusion or confluent pulmonary opacity. No acute osseous abnormality identified.  IMPRESSION: 1. No acute cardiopulmonary abnormality. 2.  Inferior approach dual lumen dialysis type catheter in place.  Original Report Authenticated By: Randall An, M.D.   Dg Ang/ext/uni/or Left  04/11/2012  *RADIOLOGY REPORT*  Clinical Data: Left leg arteriogram  LEFT ANG/EXT/UNI/ OR  Comparison:  None.  Findings: Femoral arterial bypass graft is patent.  There is narrowing at the distal anastomosis to the popliteal artery below the knee.  Anterior tibial artery and peroneal artery are patent to the ankle.  Posterior tibial artery is diminutive and it is occluded just above the ankle.  IMPRESSION: Left leg arteriogram as described.  Original Report Authenticated By: Jamas Lav, M.D.   Medications:    . DOPamine    . DISCONTD: sodium chloride    . DISCONTD: sodium chloride        . amLODipine  10 mg Oral Daily  . calcium acetate  667 mg Oral TID WC  . cefUROXime (ZINACEF)  IV  1.5 g Intravenous Q12H  . cloNIDine  0.1 mg Oral BID  .  docusate sodium  100 mg Oral Daily  . ferric gluconate (FERRLECIT/NULECIT) IV  62.5 mg Intravenous Q Mon-HD  . HYDROmorphone      . metoprolol succinate  50 mg Oral Daily  . multivitamin  1 tablet Oral QHS  . pantoprazole  40 mg Oral Daily  . paricalcitol  3 mcg Intravenous Q M,W,F-HD  . DISCONTD: amLODipine  10 mg Oral PC supper  . DISCONTD: docusate sodium  100 mg Oral BID    I  have reviewed scheduled and prn medications.  Physical Exam:  Blood pressure 186/67, pulse 76, temperature 97.9 F (36.6 C), temperature source Oral, resp. rate 13, height 4\' 10"  (1.473 m), weight 64.3 kg (141 lb 12.1 oz), SpO2 100.00%.  General: Alert BF , NAD, NO sob , appropriate , ox3  Heart: RRR, no rub ,no murmur  Lungs: CTA, no rales or wheezing  Abdomen: soft,  nontender  Extremities: trace bipedal edema  Skin:sl. Cool left foot, no ulcers  Neuro: no acute focal deficits  Dialysis Access: patent right fem. Perm cath   Dialysis Orders: Center: [facility] on [facility] . EDW 62.5 HD Bath k 2.0, ca 2.25 Time 3 hrs 21min Heparin standard. Access right fem. perm cath BFR 400 DFR 800 Zemplar 3 mcg IV/HD Epogen 0 last hgb 13.1 on 04/02/12 Units IV/HD Venofer 50 per wk. Other 0   Assessment/Plan  1. Occlusion of left femoropopliteal bypass, s/p thrombectomy/endarterectomy/revision- lots of leg pain, otherwise stable.  2. ESRD - MWF [place]- partial HD yest preop for inc'd K+, and another short HD today. K+ still up some.   3. Hypertension/volume-  edw 62.5/ 66 kg on admit, 64 kg today. HD again today. Per med list at Dialysis= On Norvasc 10mg  hs and Metoprolo 50mg  at home ? Compliance . She admitts to missing meds. Taper down clonidine and vol off with hd And use norvasc and metoprolol 4. Anemia -hgb 12.2 no epo for now weekly venofer 5. Metabolic bone disease - zemplar 34mcg and Phoslo binders/ 9.8 ca, alb 3.7 6. Nutrition - Supplement as needed, alb 3.7 7. HO copd/ asthma= prn inhaler  8. DM TYPE 2- no insulin  Kelly Splinter  MD [facility] [phone] pgr    [phone] cell 04/12/2012, 12:38 PM

## 2012-04-13 LAB — RENAL FUNCTION PANEL
BUN: 28 mg/dL — ABNORMAL HIGH (ref 6–23)
CO2: 22 mEq/L (ref 19–32)
Calcium: 9.7 mg/dL (ref 8.4–10.5)
Creatinine, Ser: 6.54 mg/dL — ABNORMAL HIGH (ref 0.50–1.10)
GFR calc non Af Amer: 6 mL/min — ABNORMAL LOW (ref >90)

## 2012-04-13 LAB — GLUCOSE, CAPILLARY
Glucose-Capillary: 80 mg/dL (ref 70–99)
Glucose-Capillary: 106 mg/dL — ABNORMAL HIGH (ref 70–99)
Glucose-Capillary: 116 mg/dL — ABNORMAL HIGH (ref 70–99)
Glucose-Capillary: 125 mg/dL — ABNORMAL HIGH (ref 70–99)

## 2012-04-13 LAB — CBC
HCT: 32 % — ABNORMAL LOW (ref 36.0–46.0)
Hemoglobin: 10.2 g/dL — ABNORMAL LOW (ref 12.0–15.0)
RBC: 3.21 MIL/uL — ABNORMAL LOW (ref 3.87–5.11)
WBC: 5.6 10*3/uL (ref 4.0–10.5)

## 2012-04-13 MED ORDER — HEPARIN SODIUM (PORCINE) 1000 UNIT/ML DIALYSIS
2000.0000 [IU] | INTRAMUSCULAR | Status: DC | PRN
  Filled 2012-04-13: qty 2

## 2012-04-13 NOTE — Progress Notes (Addendum)
[facility] [place]  Progress Note Bypass Surgery  Date of Surgery: 04/10/2012 - 04/11/2012  Procedure(s): BYPASS GRAFT FEMORAL-POPLITEAL ARTERY Surgeon: Surgeon(s): [name], MD Angelia Mould, MD  2 Days Post-Op  History of Present Illness  [name] is a 78 y.o. female who is S/P Procedure(s): BYPASS GRAFT FEMORAL-POPLITEAL ARTERY left.  The patient's pre-op symptoms of pain are Improved . Patients pain is well controlled.    VASC. LAB Studies:        pending   Imaging: Dg Ang/ext/uni/or Left  04/11/2012  *RADIOLOGY REPORT*  Clinical Data: Left leg arteriogram  LEFT ANG/EXT/UNI/ OR  Comparison:  None.  Findings: Femoral arterial bypass graft is patent.  There is narrowing at the distal anastomosis to the popliteal artery below the knee.  Anterior tibial artery and peroneal artery are patent to the ankle.  Posterior tibial artery is diminutive and it is occluded just above the ankle.  IMPRESSION: Left leg arteriogram as described.  Original Report Authenticated By: Jamas Lav, M.D.    Significant Diagnostic Studies: CBC Lab Results  Component Value Date   WBC 5.6 04/13/2012   HGB 10.2* 04/13/2012   HCT 32.0* 04/13/2012   MCV 99.7 04/13/2012   PLT 195 04/13/2012    BMET     Component Value Date/Time   NA 136 04/13/2012 0700   K 4.9 04/13/2012 0700   CL 98 04/13/2012 0700   CO2 22 04/13/2012 0700   GLUCOSE 97 04/13/2012 0700   BUN 28* 04/13/2012 0700   CREATININE 6.54* 04/13/2012 0700   CALCIUM 9.7 04/13/2012 0700   GFRNONAA 6* 04/13/2012 0700   GFRAA 6* 04/13/2012 0700    COAG Lab Results  Component Value Date   INR 1.03 04/10/2012   INR 1.03 06/04/2011   INR 1.00 08/03/2010   No results found for this basename: PTT    Physical Examination  BP Readings from Last 3 Encounters:  04/13/12 124/70  04/13/12 124/70  04/10/12 153/70   Temp Readings from Last 3 Encounters:  04/13/12 98.5 F (36.9 C) Oral  04/13/12 98.5 F (36.9  C) Oral  02/26/12 98 F (36.7 C) Oral   SpO2 Readings from Last 3 Encounters:  04/13/12 99%  04/13/12 99%  02/26/12 100%   Pulse Readings from Last 3 Encounters:  04/13/12 75  04/13/12 75  04/10/12 57    Pt is A&O x 3 left lower extremity: Incision/s is/are clean,dry.intact, and  draining or healing blake drain intact without hematoma, erythema  Limb is warm; with good color  Left Dorsalis Pedis pulse is monophasic by Doppler LeftPosterior tibial pulse is  monophasic by Doppler  Right PT palp   Assessment/Plan: Pt. Doing well Post-op pain is controlled Wounds are clean, dry, intact or healing well.  Will d/c drain in am PT/OT for ambulation Continue wound care as ordered Ambulate in room and halls with nursing.  [name] [facility] [id number] 04/13/2012 9:08 AM        agree with above  [name]

## 2012-04-13 NOTE — Progress Notes (Signed)
Subjective:  Alert, no distress.   Objective:    Vital signs in last 24 hours: Filed Vitals:   04/12/12 1730 04/12/12 1807 04/12/12 2008 04/13/12 0405  BP: 156/64 123/65 164/70 124/70  Pulse: 92 80 82 75  Temp: 96.9 F (36.1 C) 97.4 F (36.3 C) 98.8 F (37.1 C) 98.5 F (36.9 C)  TempSrc: Oral  Oral Oral  Resp: 20 18 19 20   Height:      Weight: 64 kg (141 lb 1.5 oz)   62.7 kg (138 lb 3.7 oz)  SpO2: 100% 100% 99% 99%   Weight change: -0.5 kg (-1 lb 1.6 oz)  Intake/Output Summary (Last 24 hours) at 04/13/12 1333 Last data filed at 04/13/12 0825  Gross per 24 hour  Intake    240 ml  Output    815 ml  Net   -575 ml   Labs: Basic Metabolic Panel:  Lab [id number] 0700 04/12/12 1400 04/11/12 1700 04/10/12 2047  NA 136 132* 133* 134*  K 4.9 5.7* 5.3* 6.2*  CL 98 93* 99 96  CO2 22 23 16* 19  GLUCOSE 97 127* 181* 135*  BUN 28* 48* 32* 61*  CREATININE 6.54* 8.49* 6.33* 9.11*  ALB -- -- -- --  CALCIUM 9.7 9.9 8.5 9.8  PHOS 7.4* 10.0* -- --   Liver Function Tests:  Lab 04/13/12 0700 04/12/12 1400 04/10/12 2047  AST -- -- 11  ALT -- -- 13  ALKPHOS -- -- 77  BILITOT -- -- 0.1*  PROT -- -- 7.6  ALBUMIN 3.0* 3.2* 3.7    Lab 04/10/12 2047  LIPASE 35  AMYLASE 64   No results found for this basename: AMMONIA:3 in the last 168 hours CBC:  Lab 04/13/12 0700 04/12/12 1400 04/11/12 1510 04/11/12 0715 04/10/12 2047  WBC 5.6 6.4 -- 6.1 7.1  NEUTROABS -- -- -- -- --  HGB 10.2* 10.8* 11.3* 12.2 --  HCT 32.0* 33.1* 34.7* 37.8 --  MCV 99.7 100.0 -- 99.7 99.5  PLT 195 227 -- 225 267   Cardiac Enzymes: No results found for this basename: CKTOTAL:5,CKMB:5,CKMBINDEX:5,TROPONINI:5 in the last 168 hours CBG:  Lab 04/13/12 1131 04/13/12 0605 04/12/12 2107 04/12/12 1207 04/12/12 0811  GLUCAP 106* 80 117* 121* 117*    Iron Studies: No results found for this basename: IRON:30,TIBC:30,SATURATION RATIOS:30,TRANSFERRIN:30,FERRITIN:30 in the last 168 hours Studies/Results: No results  found. Medications:    . DOPamine        . amLODipine  10 mg Oral Daily  . calcium acetate  667 mg Oral TID WC  . cloNIDine  0.1 mg Oral BID  . docusate sodium  100 mg Oral Daily  . ferric gluconate (FERRLECIT/NULECIT) IV  62.5 mg Intravenous Q Mon-HD  . metoprolol succinate  50 mg Oral Daily  . multivitamin  1 tablet Oral QHS  . pantoprazole  40 mg Oral Daily  . paricalcitol  3 mcg Intravenous Q M,W,F-HD    I  have reviewed scheduled and prn medications.  Physical Exam:  Blood pressure 124/70, pulse 75, temperature 98.5 F (36.9 C), temperature source Oral, resp. rate 20, height 4\' 10"  (1.473 m), weight 62.7 kg (138 lb 3.7 oz), SpO2 99.00%.  General: Alert BF , NAD, NO sob , appropriate , ox3  Heart: RRR, no rub ,no murmur  Lungs: CTA, no rales or wheezing  Abdomen: soft, nontender  Extremities: trace bipedal edema  Skin:sl. Cool left foot, no ulcers  Neuro: no acute focal deficits  Dialysis Access: patent right fem.  Perm cath   Dialysis Orders: Center: [facility] on [facility] . EDW 62.5 HD Bath k 2.0, ca 2.25 Time 3 hrs 36min Heparin standard. Access right fem. perm cath BFR 400 DFR 800 Zemplar 3 mcg IV/HD Epogen 0 last hgb 13.1 on 04/02/12 Units IV/HD Venofer 50 per wk. Other 0   Assessment/Plan  1. Occlusion of left femoropopliteal bypass, s/p thrombectomy/endarterectomy/revision- stable, per VVS 2. ESRD - MWF [facility]- HD tomorrow. At dry weight today. Failed chest catheters and UE accesses, failed L thigh AV access, has R thigh TDC.  3. Hypertension/volume-  edw 62.5/ 66 kg on admit, 62.5 today. On Norvasc 10mg  hs and Metoprolol 50mg  at home ? Compliance . She admitts to missing meds. Taper down clonidine and vol off with hd And use norvasc and metoprolol 4. Anemia -hgb 12.2 no epo for now weekly venofer 5. Metabolic bone disease - zemplar 63mcg and Phoslo binders/ 9.8 ca, alb 3.7 6. Nutrition - Supplement as needed, alb 3.7 7. HO copd/ asthma= prn inhaler  8. DM TYPE 2- no  insulin  Margaret Splinter  MD [facility] [phone] pgr    [phone] cell 04/13/2012, 1:33 PM

## 2012-04-13 NOTE — Progress Notes (Signed)
Pt ambulated 550 ft with rolling walker, assist X 1. Pt tolerated activity well, Will continue to monitor. Josem Kaufmann

## 2012-04-13 NOTE — Progress Notes (Signed)
Physical Therapy Evaluation Patient Details Name: [name] MRN: [id number] DOB: 1934-03-29 Today's Date: 04/13/2012  Problem List:  Patient Active Problem List  Diagnoses  . DIABETES MELLITUS, TYPE II  . GOUT  . HYPERKALEMIA  . HYPERTENSION  . MITRAL VALVE DISORDERS  . BRADYCARDIA  . COPD  . RENAL FAILURE, END STAGE  . WALKING DIFFICULTY  . PALPITATIONS  . CHEST PAIN-PRECORDIAL  . Foot pain  . Peripheral vascular disease, unspecified  . Aftercare following surgery of the circulatory system, NEC  . Atherosclerosis of native arteries of the extremities with intermittent claudication    Past Medical History:  Past Medical History  Diagnosis Date  . Unspecified essential hypertension   . Hyperpotassemia   . Other specified cardiac dysrhythmias   . Difficulty in walking   . Gout, unspecified   . Type II or unspecified type diabetes mellitus without mention of complication, not stated as uncontrolled   . Mild mitral valve stenosis   . Arthritis   . Joint pain   . Leg pain   . Thyroid disease   . Gangrene     left fifth toe  . Peripheral vascular disease   . Anemia   . Hyperlipidemia   . Aortic valve sclerosis   . Shoulder fracture, right   . End stage renal disease     dialysis 02/25/12  . Blood transfusion    Past Surgical History:  Past Surgical History  Procedure Date  . Av fistula repair     left  . Av fistula repair     right arm fistula failed  . Total abdominal hysterectomy     one ovary removed  . Arteriovenous graft placement     left femoral loop arteriovenous Gore-Tex graft.  . Av fistula placement   . Amputation 06/22/11    metatarsal amp  . Pr vein bypass graft,aorto-fem-pop 06/14/2011  . Toe amputation 2012    Left 5th toe    PT Assessment/Plan/Recommendation PT Assessment Clinical Impression Statement: 78 yo female admitted with occluded L LE bypass graft, s/p FPBG presents with decrfunctional mobility; will benefit from acute PT to  maximize independence and safety with mobility/amb/ steps to enable safe dc home PT Recommendation/Assessment: Patient will need skilled PT in the acute care venue PT Problem List: Decreased strength;Decreased range of motion;Decreased activity tolerance;Decreased mobility;Pain PT Therapy Diagnosis : Difficulty walking;Acute pain PT Plan PT Frequency: Min 3X/week PT Treatment/Interventions: DME instruction;Gait training;Stair training;Functional mobility training;Therapeutic activities;Therapeutic exercise;Patient/family education PT Recommendation Follow Up Recommendations: Home health PT;Supervision/Assistance - 24 hour Equipment Recommended: Rolling walker with 5" wheels (likely needs youth-sized RW) PT Goals  Acute Rehab PT Goals PT Goal Formulation: With patient Time For Goal Achievement: 7 days Pt will go Sit to Stand: with modified independence PT Goal: Sit to Stand - Progress: Goal set today Pt will go Stand to Sit: with modified independence PT Goal: Stand to Sit - Progress: Goal set today Pt will Ambulate: >150 feet;with modified independence;with rolling walker;with least restrictive assistive device PT Goal: Ambulate - Progress: Goal set today Pt will Go Up / Down Stairs: Flight;with modified independence;with rail(s) PT Goal: Up/Down Stairs - Progress: Goal set today  PT Evaluation Precautions/Restrictions    Prior Functioning  Home Living Lives With: Spouse;Family Available Help at Discharge: Family Type of Home: House Home Access: Stairs to enter Technical brewer of Steps: 5 Entrance Stairs-Rails: Right;Left Home Layout: Two level;Bed/bath upstairs Alternate Level Stairs-Number of Steps: 13 Alternate Level Stairs-Rails: Right Home Adaptive Equipment:  Walker - rolling (Not sure if pt's RW is short enough to fit her well) Prior Function Level of Independence: Independent with assistive device(s) Able to Take Stairs?: Yes Comments: HD  MWF Cognition Cognition Arousal/Alertness: Awake/alert Overall Cognitive Status: Appears within functional limits for tasks assessed Orientation Level: Oriented X4 Sensation/Coordination Sensation Light Touch: Appears Intact Coordination Gross Motor Movements are Fluid and Coordinated: No Coordination and Movement Description: Grossly limited by pain Extremity Assessment RUE Assessment RUE Assessment: Within Functional Limits LUE Assessment LUE Assessment: Within Functional Limits RLE Assessment RLE Assessment: Within Functional Limits LLE Assessment LLE Assessment:  (grossly decr ROM /strength, limited py pain postop) Mobility (including Balance) Bed Mobility Bed Mobility: Yes Supine to Sit: 5: Supervision Supine to Sit Details (indicate cue type and reason): Cues and encouragement to initiate, but once started, pt made transition quite smoothly Transfers Transfers: Yes Sit to Stand: 5: Supervision Sit to Stand Details (indicate cue type and reason): cues for safe hand placement and control Stand to Sit: 5: Supervision;With upper extremity assist;To chair/3-in-1 Stand to Sit Details: Uncontrolled descent Ambulation/Gait Ambulation/Gait: Yes Ambulation/Gait Assistance: 4: Min assist;5: Supervision (minguard assist progressing to supervision) Ambulation/Gait Assistance Details (indicate cue type and reason): cues for sequence, posture; pt very much wanting to walk without RW, but noted she tends to reach out to furniture/wall for UE support, therefore used RW; adjusted RW for proper fit Ambulation Distance (Feet): 120 Feet Assistive device: Rolling walker Gait Pattern: Step-to pattern;Step-through pattern (emerging step-through) Gait velocity: slow       End of Session PT - End of Session Equipment Utilized During Treatment: Gait belt Activity Tolerance: Patient tolerated treatment well Patient left: in chair;with call bell in reach Nurse Communication: Mobility status  for transfers;Mobility status for ambulation General Behavior During Session: [facility] for tasks performed Cognition: [facility] for tasks performed  Roney Marion [facility] [place], [place]  04/13/2012, 5:41 PM

## 2012-04-14 ENCOUNTER — Encounter (HOSPITAL_COMMUNITY): Payer: Self-pay | Admitting: Vascular Surgery

## 2012-04-14 ENCOUNTER — Inpatient Hospital Stay (HOSPITAL_COMMUNITY): Payer: Medicare Other

## 2012-04-14 DIAGNOSIS — Z48812 Encounter for surgical aftercare following surgery on the circulatory system: Secondary | ICD-10-CM

## 2012-04-14 LAB — CBC
HCT: 30 % — ABNORMAL LOW (ref 36.0–46.0)
Hemoglobin: 9.8 g/dL — ABNORMAL LOW (ref 12.0–15.0)
MCH: 32 pg (ref 26.0–34.0)
MCV: 98 fL (ref 78.0–100.0)
RBC: 3.06 MIL/uL — ABNORMAL LOW (ref 3.87–5.11)

## 2012-04-14 LAB — GLUCOSE, CAPILLARY
Glucose-Capillary: 113 mg/dL — ABNORMAL HIGH (ref 70–99)
Glucose-Capillary: 158 mg/dL — ABNORMAL HIGH (ref 70–99)

## 2012-04-14 LAB — RENAL FUNCTION PANEL
Albumin: 3 g/dL — ABNORMAL LOW (ref 3.5–5.2)
Chloride: 94 mEq/L — ABNORMAL LOW (ref 96–112)
GFR calc non Af Amer: 3 mL/min — ABNORMAL LOW (ref >90)
Phosphorus: 8.5 mg/dL — ABNORMAL HIGH (ref 2.3–4.6)
Potassium: 5.6 mEq/L — ABNORMAL HIGH (ref 3.5–5.1)
Sodium: 133 mEq/L — ABNORMAL LOW (ref 135–145)

## 2012-04-14 MED ORDER — DARBEPOETIN ALFA-POLYSORBATE 100 MCG/0.5ML IJ SOLN
INTRAMUSCULAR | Status: AC
  Administered 2012-04-14: 100 ug via INTRAVENOUS
  Filled 2012-04-14: qty 0.5

## 2012-04-14 MED ORDER — DARBEPOETIN ALFA-POLYSORBATE 100 MCG/0.5ML IJ SOLN
100.0000 ug | INTRAMUSCULAR | Status: DC
  Administered 2012-04-14: 100 ug via INTRAVENOUS
  Filled 2012-04-14: qty 0.5

## 2012-04-14 MED ORDER — CLONIDINE HCL 0.1 MG PO TABS
0.1000 mg | ORAL_TABLET | Freq: Every day | ORAL | Status: DC
  Administered 2012-04-14 – 2012-04-16 (×3): 0.1 mg via ORAL
  Filled 2012-04-14 (×4): qty 1

## 2012-04-14 MED ORDER — CALCIUM ACETATE 667 MG PO CAPS
2001.0000 mg | ORAL_CAPSULE | Freq: Three times a day (TID) | ORAL | Status: DC
  Administered 2012-04-15 (×3): 2001 mg via ORAL
  Administered 2012-04-16 (×3): 667 mg via ORAL
  Filled 2012-04-14 (×13): qty 3

## 2012-04-14 MED ORDER — PARICALCITOL 5 MCG/ML IV SOLN
INTRAVENOUS | Status: AC
  Administered 2012-04-14: 3 ug via INTRAVENOUS
  Filled 2012-04-14: qty 1

## 2012-04-14 NOTE — Progress Notes (Signed)
[facility] [place]  Daily Progress Note  Assessment/Planning: POD #3 s/p TE left femoral popliteal bypass graft-Gore-Tex, revision proximal and with shortening of graft, endarterectomy and patch angioplasty of distal anastomosis the popliteal artery   Awaiting PT/OT  Hopefully home soon  Subjective  - 3 Days Post-Op  C/o nausea w/ eating  Objective Filed Vitals:   04/13/12 0405 04/13/12 1440 04/13/12 2045 04/14/12 0408  BP: 124/70 131/74 138/69 146/73  Pulse: 75 76 72 68  Temp: 98.5 F (36.9 C) 98.7 F (37.1 C) 98.5 F (36.9 C) 98.1 F (36.7 C)  TempSrc: Oral Oral Oral Oral  Resp: 20 18 19 21   Height:      Weight: 138 lb 3.7 oz (62.7 kg)   139 lb 1.8 oz (63.1 kg)  SpO2: 99% 99% 98% 97%    Intake/Output Summary (Last 24 hours) at 04/14/12 1011 Last data filed at 04/13/12 2036  Gross per 24 hour  Intake    120 ml  Output     20 ml  Net    100 ml   PULM  CTAB CV  RRR GI  soft, NTND VASC  L foot warm  Laboratory CBC    Component Value Date/Time   WBC 5.6 04/13/2012 0700   WBC 6.2 11/06/2006 0920   HGB 10.2* 04/13/2012 0700   HGB 12.2 11/06/2006 0920   HCT 32.0* 04/13/2012 0700   HCT 37.7 11/06/2006 0920   PLT 195 04/13/2012 0700   PLT 375 11/06/2006 0920    BMET    Component Value Date/Time   NA 136 04/13/2012 0700   K 4.9 04/13/2012 0700   CL 98 04/13/2012 0700   CO2 22 04/13/2012 0700   GLUCOSE 97 04/13/2012 0700   BUN 28* 04/13/2012 0700   CREATININE 6.54* 04/13/2012 0700   CALCIUM 9.7 04/13/2012 0700   GFRNONAA 6* 04/13/2012 0700   GFRAA 6* 04/13/2012 0700    [name], MD [facility] [place] Office: [phone] Pager: [phone]  04/14/2012, 10:11 AM

## 2012-04-14 NOTE — Progress Notes (Signed)
Telemetry shows 5 beats VT pt resting in bed, c/o nausea bp 186/70, Dr. Bridgett Larsson made aware, will continue to monitor [name]

## 2012-04-14 NOTE — Plan of Care (Signed)
Problem: Phase II Progression Outcomes Goal: Sutures/staples intact Outcome: Completed/Met Date Met:  04/14/12 Incisions with liquid adhesive skin closure intact Goal: Return of bowel function (flatus, BM) IF ABDOMINAL SURGERY:  Outcome: Progressing Bisacodyl given PO for constipation (last bm 4/11)

## 2012-04-14 NOTE — Progress Notes (Signed)
Subjective:  Alert in bed.  No problems overnight.  Drain still in place with bloody drainage, patient nervous about that.  Leg feels better.  Objective Vital signs in last 24 hours: Filed Vitals:   04/13/12 0405 04/13/12 1440 04/13/12 2045 04/14/12 0408  BP: 124/70 131/74 138/69 146/73  Pulse: 75 76 72 68  Temp: 98.5 F (36.9 C) 98.7 F (37.1 C) 98.5 F (36.9 C) 98.1 F (36.7 C)  TempSrc: Oral Oral Oral Oral  Resp: 20 18 19 21   Height:      Weight: 62.7 kg (138 lb 3.7 oz)   63.1 kg (139 lb 1.8 oz)  SpO2: 99% 99% 98% 97%   Weight change: -2.4 kg (-5 lb 4.7 oz)  Intake/Output Summary (Last 24 hours) at 04/14/12 0810 Last data filed at 04/13/12 2036  Gross per 24 hour  Intake    360 ml  Output     20 ml  Net    340 ml   Labs: Basic Metabolic Panel:  Lab [id number] 0700 04/12/12 1400 04/11/12 1700  NA 136 132* 133*  K 4.9 5.7* 5.3*  CL 98 93* 99  CO2 22 23 16*  GLUCOSE 97 127* 181*  BUN 28* 48* 32*  CREATININE 6.54* 8.49* 6.33*  CALCIUM 9.7 9.9 8.5  ALB -- -- --  PHOS 7.4* 10.0* --   Liver Function Tests:  Lab 04/13/12 0700 04/12/12 1400 04/10/12 2047  AST -- -- 11  ALT -- -- 13  ALKPHOS -- -- 77  BILITOT -- -- 0.1*  PROT -- -- 7.6  ALBUMIN 3.0* 3.2* 3.7    Lab 04/10/12 2047  LIPASE 35  AMYLASE 64   No results found for this basename: AMMONIA:3 in the last 168 hours CBC:  Lab 04/13/12 0700 04/12/12 1400 04/11/12 1510 04/11/12 0715 04/10/12 2047  WBC 5.6 6.4 -- 6.1 --  NEUTROABS -- -- -- -- --  HGB 10.2* 10.8* 11.3* -- --  HCT 32.0* 33.1* 34.7* -- --  MCV 99.7 100.0 -- 99.7 99.5  PLT 195 227 -- 225 --   Cardiac Enzymes: No results found for this basename: CKTOTAL:5,CKMB:5,CKMBINDEX:5,TROPONINI:5 in the last 168 hours CBG:  Lab 04/14/12 0611 04/13/12 2114 04/13/12 1622 04/13/12 1131 04/13/12 0605  GLUCAP 114* 125* 116* 106* 80    Iron Studies: No results found for this basename: IRON,TIBC,TRANSFERRIN,FERRITIN in the last 72  hours Studies/Results: No results found. Medications: Infusions:    . DOPamine      Scheduled Medications:    . amLODipine  10 mg Oral Daily  . calcium acetate  667 mg Oral TID WC  . cloNIDine  0.1 mg Oral BID  . docusate sodium  100 mg Oral Daily  . ferric gluconate (FERRLECIT/NULECIT) IV  62.5 mg Intravenous Q Mon-HD  . metoprolol succinate  50 mg Oral Daily  . multivitamin  1 tablet Oral QHS  . pantoprazole  40 mg Oral Daily  . paricalcitol  3 mcg Intravenous Q M,W,F-HD    have reviewed scheduled and prn medications.  Physical Exam: General: alert, NAD Heart: RRR Lungs: mostly clear Abdomen: soft, NT Extremities: no significant edema Dialysis Access: R femoral PC   I Assessment/ Plan: Pt is a 78 y.o. yo female ESRD who was admitted on 04/10/2012 with and occluded fem pop bypass req operative management  Assessment/Plan: 1. Occluded fem-pop bypass - s/p thrombectomy/endarterectomy/revision-  Seems to be doing well clinically, plans per VVS 2. ESRD - MWF via femoral PC, normally at [facility].  Do today, no heparin 3. Anemia- blood count dropping, not surprising with surgery- add aranesp as well 4. Secondary hyperparathyroidism- will increase binders for phos of 7. On zemplar with HD as well 5. HTN/volume- BP been very well controlled, trying to wean clonidine 6. DM- good control  Jonahtan Manseau A   04/14/2012,8:10 AM  LOS: 4 days

## 2012-04-14 NOTE — Progress Notes (Signed)
VASCULAR LAB PRELIMINARY  PRELIMINARY  PRELIMINARY  PRELIMINARY  Post op ABIs completed. completed.      RIGHT    LEFT    PRESSURE WAVEFORM  PRESSURE WAVEFORM  BRACHIAL   BRACHIAL 190   DP   DP    AT 190 biphasic AT 180 monophasic  PT 220 biphasic PT 301 monophasic  PER   PER    GREAT TOE  NA GREAT TOE  NA    RIGHT LEFT  ABI 1.16 1.58    Improved pedal waveforms on left leg post op.  ABIs are falsely elevated due to vessel calcification.  Margarette Canada,   RVT 04/14/2012, 2:59 PM

## 2012-04-15 ENCOUNTER — Other Ambulatory Visit: Payer: Medicare Other

## 2012-04-15 ENCOUNTER — Ambulatory Visit: Payer: Medicare Other | Admitting: Vascular Surgery

## 2012-04-15 LAB — GLUCOSE, CAPILLARY: Glucose-Capillary: 164 mg/dL — ABNORMAL HIGH (ref 70–99)

## 2012-04-15 LAB — CBC
HCT: 33.5 % — ABNORMAL LOW (ref 36.0–46.0)
MCH: 32.2 pg (ref 26.0–34.0)
MCV: 100.9 fL — ABNORMAL HIGH (ref 78.0–100.0)
Platelets: 214 10*3/uL (ref 150–400)
RDW: 13.8 % (ref 11.5–15.5)

## 2012-04-15 MED ORDER — HEPARIN SODIUM (PORCINE) 1000 UNIT/ML DIALYSIS
20.0000 [IU]/kg | INTRAMUSCULAR | Status: DC | PRN
  Filled 2012-04-15: qty 2

## 2012-04-15 MED ORDER — OXYCODONE HCL 5 MG PO TABS: 5.0000 mg | ORAL_TABLET | Freq: Four times a day (QID) | ORAL | Status: AC | PRN

## 2012-04-15 MED ORDER — CLONIDINE HCL 0.1 MG PO TABS: 0.1000 mg | ORAL_TABLET | Freq: Every day | ORAL | Status: DC

## 2012-04-15 MED ORDER — METOPROLOL SUCCINATE ER 50 MG PO TB24: 50.0000 mg | ORAL_TABLET | Freq: Every day | ORAL | Status: DC

## 2012-04-15 NOTE — Progress Notes (Signed)
JP drain removed- dsg to site [name]

## 2012-04-15 NOTE — Progress Notes (Signed)
Subjective:  Alert in bed.  Had HD last night, UF of 1600 some decrease in BP.  Drain removed today. Customer service complaints, wants to go home but also c/o constipation and nausea  Objective Vital signs in last 24 hours: Filed Vitals:   04/14/12 2100 04/14/12 2123 04/14/12 2231 04/15/12 0423  BP: 95/62 128/63 139/88 135/65  Pulse: 92 81 88 79  Temp:  98.2 F (36.8 C) 98.2 F (36.8 C) 97 F (36.1 C)  TempSrc:  Oral Oral Oral  Resp: 12 14 18 18   Height:      Weight:  64.5 kg (142 lb 3.2 oz)    SpO2:  98% 98% 100%   Weight change: 3.1 kg (6 lb 13.4 oz)  Intake/Output Summary (Last 24 hours) at 04/15/12 1043 Last data filed at 04/15/12 0800  Gross per 24 hour  Intake    600 ml  Output   1600 ml  Net  -1000 ml   Labs: Basic Metabolic Panel:  Lab [id number] 1952 04/13/12 0700 04/12/12 1400  NA 133* 136 132*  K 5.6* 4.9 5.7*  CL 94* 98 93*  CO2 23 22 23   GLUCOSE 114* 97 127*  BUN 48* 28* 48*  CREATININE 10.04* 6.54* 8.49*  CALCIUM 9.5 9.7 9.9  ALB -- -- --  PHOS 8.5* 7.4* 10.0*   Liver Function Tests:  Lab 04/14/12 1952 04/13/12 0700 04/12/12 1400 04/10/12 2047  AST -- -- -- 11  ALT -- -- -- 13  ALKPHOS -- -- -- 77  BILITOT -- -- -- 0.1*  PROT -- -- -- 7.6  ALBUMIN 3.0* 3.0* 3.2* --    Lab 04/10/12 2047  LIPASE 35  AMYLASE 64   No results found for this basename: AMMONIA:3 in the last 168 hours CBC:  Lab 04/14/12 1952 04/13/12 0700 04/12/12 1400 04/11/12 0715 04/10/12 2047  WBC 6.5 5.6 6.4 -- --  NEUTROABS -- -- -- -- --  HGB 9.8* 10.2* 10.8* -- --  HCT 30.0* 32.0* 33.1* -- --  MCV 98.0 99.7 100.0 99.7 99.5  PLT 223 195 227 -- --   Cardiac Enzymes: No results found for this basename: CKTOTAL:5,CKMB:5,CKMBINDEX:5,TROPONINI:5 in the last 168 hours CBG:  Lab 04/15/12 0634 04/14/12 2228 04/14/12 1637 04/14/12 0611 04/13/12 2114  GLUCAP 96 158* 113* 114* 125*    Iron Studies: No results found for this basename: IRON,TIBC,TRANSFERRIN,FERRITIN in the  last 72 hours Studies/Results: No results found. Medications: Infusions:    . DOPamine      Scheduled Medications:    . amLODipine  10 mg Oral Daily  . calcium acetate  2,001 mg Oral TID WC  . cloNIDine  0.1 mg Oral QHS  . darbepoetin (ARANESP) injection - DIALYSIS  100 mcg Intravenous Q Mon-HD  . docusate sodium  100 mg Oral Daily  . ferric gluconate (FERRLECIT/NULECIT) IV  62.5 mg Intravenous Q Mon-HD  . metoprolol succinate  50 mg Oral Daily  . multivitamin  1 tablet Oral QHS  . pantoprazole  40 mg Oral Daily  . paricalcitol  3 mcg Intravenous Q M,W,F-HD    have reviewed scheduled and prn medications.  Physical Exam: General: alert, NAD Heart: RRR Lungs: mostly clear Abdomen: soft, NT Extremities: no significant edema Dialysis Access: R femoral PC   I Assessment/ Plan: Pt is a 78 y.o. yo female ESRD who was admitted on 04/10/2012 with and occluded fem pop bypass req operative management  Assessment/Plan: 1. Occluded fem-pop bypass - s/p thrombectomy/endarterectomy/revision-  Seems to be  doing well clinically, plans per [facility].  Possible d/c tomorrow 2. ESRD - MWF via femoral PC, normally at [facility].  Next tomorrow, tight heparin 3. Anemia- blood count dropping, not surprising with surgery- on aranesp as well as iron 4. Secondary hyperparathyroidism-increased binders for phos of 7. On zemplar with HD as well 5. HTN/volume- BP been very well controlled, trying to wean clonidine.  Have only on qhs now, would continue that as OP 6. DM- good control 7. Patient anxious for d/c.  Will do first shift tomorrow pre possible d/c  Cariann Kinnamon A   04/15/2012,10:43 AM  LOS: 5 days

## 2012-04-15 NOTE — Progress Notes (Addendum)
[facility] Progress Note  04/15/2012 7:35 AM POD 4  Subjective:  No complaints  Afebrile x 24 hrous HR [id number] 80-180systolic [id number] Filed Vitals:   04/15/12 0423  BP: 135/65  Pulse: 79  Temp: 97 F (36.1 C)  Resp: 18    Physical Exam: Incisions:  C/d/i. Extremities:  JP in place.  BLE warm and well perfused.  CBC    Component Value Date/Time   WBC 6.5 04/14/2012 1952   WBC 6.2 11/06/2006 0920   RBC 3.06* 04/14/2012 1952   RBC 4.25 11/06/2006 0920   HGB 9.8* 04/14/2012 1952   HGB 12.2 11/06/2006 0920   HCT 30.0* 04/14/2012 1952   HCT 37.7 11/06/2006 0920   PLT 223 04/14/2012 1952   PLT 375 11/06/2006 0920   MCV 98.0 04/14/2012 1952   MCV 88.7 11/06/2006 0920   MCH 32.0 04/14/2012 1952   MCH 28.7 11/06/2006 0920   MCHC 32.7 04/14/2012 1952   MCHC 32.4 11/06/2006 0920   RDW 13.8 04/14/2012 1952   RDW 15.5* 11/06/2006 0920   LYMPHSABS 1.7 06/18/2011 0533   LYMPHSABS 1.5 11/06/2006 0920   MONOABS 1.0 06/18/2011 0533   MONOABS 0.5 11/06/2006 0920   EOSABS 0.8* 06/18/2011 0533   EOSABS 0.2 11/06/2006 0920   BASOSABS 0.1 06/18/2011 0533   BASOSABS 0.0 11/06/2006 0920    BMET    Component Value Date/Time   NA 133* 04/14/2012 1952   K 5.6* 04/14/2012 1952   CL 94* 04/14/2012 1952   CO2 23 04/14/2012 1952   GLUCOSE 114* 04/14/2012 1952   BUN 48* 04/14/2012 1952   CREATININE 10.04* 04/14/2012 1952   CALCIUM 9.5 04/14/2012 1952   GFRNONAA 3* 04/14/2012 1952   GFRAA 4* 04/14/2012 1952    INR    Component Value Date/Time   INR 1.03 04/10/2012 2047     Intake/Output Summary (Last 24 hours) at 04/15/12 0735 Last data filed at 04/14/12 2123  Gross per 24 hour  Intake    480 ml  Output   1600 ml  Net  -1120 ml   ABIs 04/14/12   RIGHT    LEFT     PRESSURE  WAVEFORM   PRESSURE  WAVEFORM   BRACHIAL    BRACHIAL  190    DP    DP     AT  190  biphasic  AT  180  monophasic   PT  220  biphasic  PT  301  monophasic   PER    PER     GREAT TOE   NA  GREAT TOE   NA     RIGHT  LEFT   ABI  1.16  1.58       Assessment/Plan:  78 y.o. female is s/p TE left femoral popliteal bypass graft-Gore-Tex, revision proximal and with shortening of graft, endarterectomy and patch angioplasty of distal anastomosis the popliteal artery POD 4 -needs PT consult -hopefully d/c soon -d/c JP drain. -continue to mobilize   Evorn Gong, PA-C [facility] [phone] 04/15/2012 7:35 AM  Addendum  I have independently interviewed and examined the patient, and I agree with the physician assistant's findings.  JP still in place though ordered to be removed.  Below the knee Incision intact.  D/C after PT/OT evaluation.  [name], MD [facility] [place] Office: [phone] Pager: [phone]  04/15/2012, 7:52 AM

## 2012-04-15 NOTE — Progress Notes (Signed)
CARE MANAGEMENT NOTE 04/15/2012    HH-2 PT      [place].   Status of service:  Completed, signed off Discharge Disposition:  [place]  Per UR Regulation:    If discussed at Long Length of Stay Meetings, dates discussed:    Comments:  04/15/12 [place], RN BSN Case Manager  Patient has reconsidered her need for home health therapy and has agreed to have it put in place.

## 2012-04-15 NOTE — Progress Notes (Signed)
CARE MANAGEMENT NOTE 04/15/2012  Patient:  [name], [name]   Account Number:  [id number]  Date Initiated:  04/14/2012  Documentation initiated by:  Ricki Miller  Subjective/Objective Assessment:   78 yr old female s/p thrombectomy of left fem pop bypass graft   Spoke with patient regarding home health and DME needs. Patient states she doesnt want or need HH therapy, she has a rolling walker and 3in1. Case manager will sign off.

## 2012-04-15 NOTE — Progress Notes (Signed)
Physical Therapy Treatment Patient Details Name: [name] MRN: [id number] DOB: Sep 25, 1934 Today's Date: 04/15/2012  PT Assessment/Plan  PT - Assessment/Plan Comments on Treatment Session: Pt deferring PT session initially- "I don't need any physical therapy", but pt agreeable to participate after encouragement.  Pt moving fairly well.  Pt declined use of RW in todays session but admits to feeling "wobbly".  Encouraged pt to use RW to increase balance & safety.    PT Plan: Discharge plan remains appropriate PT Frequency: Min 3X/week Follow Up Recommendations: Home health PT;Supervision/Assistance - 24 hour Equipment Recommended: Rolling walker with 5" wheels PT Goals  Acute Rehab PT Goals PT Goal: Sit to Stand - Progress: Met PT Goal: Stand to Sit - Progress: Met PT Goal: Ambulate - Progress: Progressing toward goal PT Goal: Up/Down Stairs - Progress: Progressing toward goal  PT Treatment Precautions/Restrictions  Restrictions Weight Bearing Restrictions: No Mobility (including Balance) Bed Mobility Bed Mobility: No Transfers Transfers: Yes Sit to Stand: 6: Modified independent (Device/Increase time);From chair/3-in-1;With armrests;From toilet;With upper extremity assist Stand to Sit: 6: Modified independent (Device/Increase time);With armrests;With upper extremity assist;To chair/3-in-1 Ambulation/Gait Ambulation/Gait: Yes Ambulation/Gait Assistance: Other (comment) (Min Guard (A)) Ambulation/Gait Assistance Details (indicate cue type and reason): Pt declining use of RW- states "I don't need it.  I've been getting around without it".  No LOB noted but pt does have mild unsteadiness of which pt is aware of- " I know I'm a bit wobbly".  Encouraged pt to continue to use RW to increase safety & stability.   Ambulation Distance (Feet): 200 Feet Assistive device: None Gait Pattern: Step-through pattern;Decreased stride length;Shuffle;Decreased step length - right;Decreased step  length - left Gait velocity: slow Stairs: Yes Stairs Assistance: 4: Min assist Stairs Assistance Details (indicate cue type and reason): Rail on Rt side & HHA on Lt side.  (A) for support.  Used minimally.   Stair Management Technique: One rail Right;Forwards;Step to pattern Number of Stairs: 10  Wheelchair Mobility Wheelchair Mobility: No    Exercise    End of Session PT - End of Session Equipment Utilized During Treatment: Gait belt Activity Tolerance: Patient tolerated treatment well Patient left: in chair;with call bell in reach Nurse Communication: Mobility status for transfers;Mobility status for ambulation General Behavior During Session: [facility] for tasks performed Cognition: [facility] for tasks performed  Sena Hitch 04/15/2012, 3:32 PM [phone]

## 2012-04-16 ENCOUNTER — Inpatient Hospital Stay (HOSPITAL_COMMUNITY): Payer: Medicare Other

## 2012-04-16 DIAGNOSIS — I495 Sick sinus syndrome: Secondary | ICD-10-CM

## 2012-04-16 DIAGNOSIS — R000 Tachycardia, unspecified: Secondary | ICD-10-CM

## 2012-04-16 LAB — CARDIAC PANEL(CRET KIN+CKTOT+MB+TROPI)
CK, MB: 3 ng/mL (ref 0.3–4.0)
Relative Index: INVALID (ref 0.0–2.5)
Total CK: 55 U/L (ref 7–177)
Troponin I: <0.3 ng/mL (ref <0.30)

## 2012-04-16 LAB — CBC
Hemoglobin: 10 g/dL — ABNORMAL LOW (ref 12.0–15.0)
MCH: 32.6 pg (ref 26.0–34.0)
Platelets: 231 10*3/uL (ref 150–400)
RBC: 3.07 MIL/uL — ABNORMAL LOW (ref 3.87–5.11)
WBC: 6 10*3/uL (ref 4.0–10.5)

## 2012-04-16 LAB — RENAL FUNCTION PANEL
CO2: 25 mEq/L (ref 19–32)
Chloride: 95 mEq/L — ABNORMAL LOW (ref 96–112)
GFR calc Af Amer: 4 mL/min — ABNORMAL LOW (ref >90)
GFR calc non Af Amer: 4 mL/min — ABNORMAL LOW (ref >90)
Glucose, Bld: 126 mg/dL — ABNORMAL HIGH (ref 70–99)
Sodium: 133 mEq/L — ABNORMAL LOW (ref 135–145)

## 2012-04-16 LAB — GLUCOSE, CAPILLARY

## 2012-04-16 MED ORDER — ONDANSETRON HCL 4 MG/2ML IJ SOLN
INTRAMUSCULAR | Status: AC
  Filled 2012-04-16: qty 2

## 2012-04-16 MED ORDER — PARICALCITOL 5 MCG/ML IV SOLN
INTRAVENOUS | Status: AC
  Administered 2012-04-16: 3 ug via INTRAVENOUS
  Filled 2012-04-16: qty 1

## 2012-04-16 MED ORDER — METOPROLOL TARTRATE 1 MG/ML IV SOLN: 5.0000 mg | Freq: Four times a day (QID) | INTRAVENOUS | Status: DC | PRN

## 2012-04-16 MED ORDER — PROMETHAZINE HCL 25 MG/ML IJ SOLN
INTRAMUSCULAR | Status: AC
  Administered 2012-04-16: 25 mg
  Filled 2012-04-16: qty 1

## 2012-04-16 MED ORDER — HYDROCODONE-ACETAMINOPHEN 5-325 MG PO TABS
ORAL_TABLET | ORAL | Status: AC
  Filled 2012-04-16: qty 1

## 2012-04-16 NOTE — Consult Note (Signed)
CONSULT NOTE  Date: 04/16/2012               Patient Name:  [name] MRN: [id number]  DOB: Apr 25, 1934 Age / Sex: 78 y.o., female        PCP: Vena Austria, MD, MD Primary Cardiologist: Marius Ditch            Service Requesting Consult: Vein & vascular Surgery              Reason for Consult: Sinus tachycardia           History of Present Illness: Patient is a 78 y.o. female with a PMHx of ESRD, HTN, DM, who was admitted to [facility] on 04/10/2012 for evaluation of occlusion of her fem-pop graft.    She developed a fast HR this evening and we were consulted for further evaluation.  She denies any chest pain, dyspnea, syncope.  She is very tender in her left leg at the site of the incision.  She has not cough, sputum production, .      Medications: Outpatient medications: Prescriptions prior to admission  Medication Sig Dispense Refill  . amLODipine (NORVASC) 10 MG tablet Take 10 mg by mouth daily.      . calcium acetate (PHOSLO) 667 MG capsule Take 667 mg by mouth 3 (three) times daily with meals.      Marland Kitchen DISCONTD: cloNIDine (CATAPRES) 0.2 MG tablet Take 0.2 mg by mouth 2 (two) times daily.          Current medications: Current Facility-Administered Medications  Medication Dose Route Frequency Provider Last Rate Last Dose  . 0.9 %  sodium chloride infusion  100 mL Intravenous PRN Sol Blazing, MD      . 0.9 %  sodium chloride infusion  100 mL Intravenous PRN Sol Blazing, MD      . acetaminophen (TYLENOL) tablet 325-650 mg  325-650 mg Oral Q4H PRN Ulyses Amor, PA       Or  . acetaminophen (TYLENOL) suppository 325-650 mg  325-650 mg Rectal Q4H PRN Ulyses Amor, PA      . amLODipine (NORVASC) tablet 10 mg  10 mg Oral Daily Ulyses Amor, PA   10 mg at 04/16/12 1121  . bisacodyl (DULCOLAX) EC tablet 5 mg  5 mg Oral Daily PRN Ulyses Amor, PA   5 mg at 04/15/12 0841  . calcium acetate (PHOSLO) capsule 2,001 mg  2,001 mg Oral TID WC Louis Meckel, MD   667 mg at 04/16/12 1723  . cloNIDine (CATAPRES) tablet 0.1 mg  0.1 mg Oral QHS Louis Meckel, MD   0.1 mg at 04/15/12 2230  . darbepoetin (ARANESP) injection 100 mcg  100 mcg Intravenous Q Mon-HD Louis Meckel, MD   100 mcg at 04/14/12 1957  . docusate sodium (COLACE) capsule 100 mg  100 mg Oral Daily Ulyses Amor, PA   100 mg at 04/16/12 1121  . DOPamine (INTROPIN) 800 mg in dextrose 5 % 250 mL infusion  3-5 mcg/kg/min Intravenous Continuous Ulyses Amor, PA      . ferric gluconate (NULECIT) 62.5 mg in sodium chloride 0.9 % 100 mL IVPB  62.5 mg Intravenous Q Mon-HD Foye Clock, PA   62.5 mg at 04/14/12 2042  . guaiFENesin-dextromethorphan (ROBITUSSIN DM) 100-10 MG/5ML syrup 15 mL  15 mL Oral Q4H PRN Ulyses Amor, PA      . heparin injection 1,000 Units  1,000 Units  Dialysis PRN Sol Blazing, MD      . heparin injection 1,300 Units  20 Units/kg Dialysis PRN Louis Meckel, MD      . heparin injection 2,000 Units  2,000 Units Dialysis PRN Sol Blazing, MD      . hydrALAZINE (APRESOLINE) injection 10 mg  10 mg Intravenous Q2H PRN Ulyses Amor, PA      . labetalol (NORMODYNE,TRANDATE) injection 10 mg  10 mg Intravenous Q2H PRN Ulyses Amor, PA   10 mg at 04/12/12 1430  . metoprolol (LOPRESSOR) injection 2-5 mg  2-5 mg Intravenous Q2H PRN Ulyses Amor, PA      . metoprolol succinate (TOPROL-XL) 24 hr tablet 50 mg  50 mg Oral Daily Leonie Green Aurora, PA   50 mg at 04/16/12 0939  . morphine 2 MG/ML injection 2-5 mg  2-5 mg Intravenous Q1H PRN Ulyses Amor, PA   4 mg at 04/12/12 1041  . multivitamin (RENA-VIT) tablet 1 tablet  1 tablet Oral QHS Foye Clock, PA   1 tablet at 04/15/12 2230  . ondansetron (ZOFRAN) injection 4 mg  4 mg Intravenous Q4H PRN Serafina Mitchell, MD   4 mg at 04/15/12 0842  . oxyCODONE (Oxy IR/ROXICODONE) immediate release tablet 5-10 mg  5-10 mg Oral Q4H PRN Ulyses Amor, PA   10 mg at 04/15/12 2326  .  pantoprazole (PROTONIX) EC tablet 40 mg  40 mg Oral Daily Elam Dutch, MD   40 mg at 04/16/12 1121  . paricalcitol (ZEMPLAR) injection 3 mcg  3 mcg Intravenous Q M,W,F-HD Foye Clock, PA   3 mcg at 04/16/12 0851  . phenol (CHLORASEPTIC) mouth spray 1 spray  1 spray Mouth/Throat PRN Ulyses Amor, PA      . promethazine (PHENERGAN) 25 MG/ML injection        25 mg at 04/16/12 0850  . senna-docusate (Senokot-S) tablet 1 tablet  1 tablet Oral QHS PRN Ulyses Amor, PA      . DISCONTD: HYDROcodone-acetaminophen (NORCO) 5-325 MG per tablet           . DISCONTD: ondansetron (ZOFRAN) 4 MG/2ML injection              Allergies  Allergen Reactions  . Ace Inhibitors Other (See Comments)    Reaction unknown     Past Medical History  Diagnosis Date  . Unspecified essential hypertension   . Hyperpotassemia   . Other specified cardiac dysrhythmias   . Difficulty in walking   . Gout, unspecified   . Type II or unspecified type diabetes mellitus without mention of complication, not stated as uncontrolled   . Mild mitral valve stenosis   . Arthritis   . Joint pain   . Leg pain   . Thyroid disease   . Gangrene     left fifth toe  . Peripheral vascular disease   . Anemia   . Hyperlipidemia   . Aortic valve sclerosis   . Shoulder fracture, right   . End stage renal disease     dialysis 02/25/12  . Blood transfusion     Past Surgical History  Procedure Date  . Av fistula repair     left  . Av fistula repair     right arm fistula failed  . Total abdominal hysterectomy     one ovary removed  . Arteriovenous graft placement     left femoral loop arteriovenous Gore-Tex graft.  Marland Kitchen  Av fistula placement   . Amputation 06/22/11    metatarsal amp  . Pr vein bypass graft,aorto-fem-pop 06/14/2011  . Toe amputation 2012    Left 5th toe  . Femoral-popliteal bypass graft 04/11/2012    Procedure: BYPASS GRAFT FEMORAL-POPLITEAL ARTERY;  Surgeon: [name], MD;  Location: [facility];  Service:  Vascular;  Laterality: Left;  Thrombectomy/Left femoral-popliteal bypass with revision of proximal end and shortening of graft; intraoperative arteriogram; endarterectomy and patch angioplasty with distal anastomosis    Family History  Problem Relation Age of Onset  . Peripheral vascular disease Mother     Social History:  reports that she quit smoking about 23 years ago. Her smoking use included Cigarettes. She has never used smokeless tobacco. She reports that she does not drink alcohol or use illicit drugs.   Review of Systems: Constitutional:  denies fever, chills, diaphoresis, appetite change and fatigue.  HEENT: denies photophobia, eye pain, redness, hearing loss, ear pain, congestion, sore throat, rhinorrhea, sneezing, neck pain, neck stiffness and tinnitus.  Respiratory: denies SOB, DOE, cough, chest tightness, and wheezing.  Cardiovascular: denies chest pain, palpitations and leg swelling.  Gastrointestinal: denies nausea, vomiting, abdominal pain, diarrhea, constipation, blood in stool.  Genitourinary: denies dysuria, urgency, frequency, hematuria, flank pain and difficulty urinating.  Musculoskeletal: admits to  Leg pain at the surgical site  Skin: denies pallor, rash and wound.  Neurological: denies dizziness, seizures, syncope, weakness, light-headedness, numbness and headaches.   Hematological: denies adenopathy, easy bruising, personal or family bleeding history.  Psychiatric/ Behavioral: denies suicidal ideation, mood changes, confusion, nervousness, sleep disturbance and agitation.    Physical Exam: BP 150/68  Pulse 88  Temp(Src) 98.2 F (36.8 C) (Oral)  Resp 18  Ht 4\' 10"  (1.473 m)  Wt 142 lb 13.7 oz (64.8 kg)  BMI 29.86 kg/m2  SpO2 99%  General: Vital signs reviewed and noted. NAD  Head: Normocephalic, atraumatic, sclera anicteric, mucus membranes are moist  Neck: Supple. Negative for carotid bruits. JVD not elevated.  Lungs:  Clear bilaterally to  auscultation without wheezes, rales, or rhonchi. Breathing is unlabored.  Heart: RRR with S1 S2. No murmurs, rubs, or gallops appreciated.  tachycardic  Abdomen:  Soft, non-tender, non-distended with normoactive bowel sounds. No hepatomegaly. No rebound/guarding. No obvious abdominal masses  MSK: Strength and the appear normal for age.  Extremities: Would at left groin is healing well.  The wound at her lower leg is slightly warm. No drainage.  Neurologic: Alert and oriented X 3. Moves all extremities spontaneously.  Psych: Responds to questions appropriately with a normal affect.    Lab results: Basic Metabolic Panel:  Lab [id number] 0818 04/14/12 1952 04/13/12 0700  NA 133* 133* 136  K 5.1 5.6* 4.9  CL 95* 94* 98  CO2 25 23 22   GLUCOSE 126* 114* 97  BUN 34* 48* 28*  CREATININE 8.96* 10.04* 6.54*  CALCIUM 9.9 9.5 9.7  MG -- -- --  PHOS 7.0* 8.5* 7.4*    Liver Function Tests:  Lab 04/16/12 0818 04/14/12 1952 04/13/12 0700 04/10/12 2047  AST -- -- -- 11  ALT -- -- -- 13  ALKPHOS -- -- -- 77  BILITOT -- -- -- 0.1*  PROT -- -- -- 7.6  ALBUMIN 2.9* 3.0* 3.0* --    Lab 04/10/12 2047  LIPASE 35  AMYLASE 64   No results found for this basename: AMMONIA:3 in the last 168 hours  CBC:  Lab 04/16/12 0818 04/15/12 1046 04/14/12 1952 04/13/12 0700 04/12/12  1400  WBC 6.0 5.7 6.5 -- --  NEUTROABS -- -- -- -- --  HGB 10.0* 10.7* 9.8* -- --  HCT 30.6* 33.5* 30.0* -- --  MCV 99.7 100.9* 98.0 99.7 100.0  PLT 231 214 223 -- --    Cardiac Enzymes:  Lab 04/16/12 1015  CKTOTAL 55  CKMB 3.0  CKMBINDEX --  TROPONINI <0.30    BNP: No components found with this basename: POCBNP:3  CBG:  Lab 04/16/12 1634 04/16/12 1145 04/16/12 0541 04/15/12 2057 04/15/12 1628  GLUCAP 107* 146* 129* 164* 113*    Coagulation Studies: No results found for this basename: LABPROT:3,INR:3 in the last 72 hours  Tele:  Sinus tach at 125.  No ST or T wave changes.  SEveral minutes later her HR was  89 and she was resting quietly in bed   Assessment & Plan:  1. Sinus tachycardia:  This is a secondary issue and not a primary cardiac arrhythmia.  There is no evidence of coronary ischemia. No signs or symptoms of PE.  Incision is a bit warm but no real evidence of infection.  She may be sligtly volume depleted.  This should resolve after a meal.    In the past 10 minutes, her HR has slowed to 89.  She is resting comfortable in bed.  I note that she had sinus bradycardia on 4/12 - I would not be inclined to increase her baseline metoprolol.    Her labs look OK .  Perhaps we should repeat some bloodwork to insure that she is not having other issues.  I have discussed this with Dr. Bridgett Larsson. OK to go home tomorrow from cardiac standpoint.  She can follow up with Dr. Julianne Handler.  Call us for further problems.     Thayer Headings, Brooke Bonito., MD, [facility] 04/16/2012, 5:59 PM

## 2012-04-16 NOTE — Progress Notes (Addendum)
[facility] Progress Note  04/16/2012 8:44 AM POD 5  Subjective:  Feels a little sick this morning. Pt is in HD.  Afebrile x 24hrs Filed Vitals:   04/16/12 0830  BP: 134/71  Pulse: 80  Temp:   Resp: 11    Physical Exam:  Extremities:  BLE warm and well perfused.  CBC    Component Value Date/Time   WBC 6.0 04/16/2012 0818   WBC 6.2 11/06/2006 0920   RBC 3.07* 04/16/2012 0818   RBC 4.25 11/06/2006 0920   HGB 10.0* 04/16/2012 0818   HGB 12.2 11/06/2006 0920   HCT 30.6* 04/16/2012 0818   HCT 37.7 11/06/2006 0920   PLT 231 04/16/2012 0818   PLT 375 11/06/2006 0920   MCV 99.7 04/16/2012 0818   MCV 88.7 11/06/2006 0920   MCH 32.6 04/16/2012 0818   MCH 28.7 11/06/2006 0920   MCHC 32.7 04/16/2012 0818   MCHC 32.4 11/06/2006 0920   RDW 13.7 04/16/2012 0818   RDW 15.5* 11/06/2006 0920   LYMPHSABS 1.7 06/18/2011 0533   LYMPHSABS 1.5 11/06/2006 0920   MONOABS 1.0 06/18/2011 0533   MONOABS 0.5 11/06/2006 0920   EOSABS 0.8* 06/18/2011 0533   EOSABS 0.2 11/06/2006 0920   BASOSABS 0.1 06/18/2011 0533   BASOSABS 0.0 11/06/2006 0920    BMET    Component Value Date/Time   NA 133* 04/14/2012 1952   K 5.6* 04/14/2012 1952   CL 94* 04/14/2012 1952   CO2 23 04/14/2012 1952   GLUCOSE 114* 04/14/2012 1952   BUN 48* 04/14/2012 1952   CREATININE 10.04* 04/14/2012 1952   CALCIUM 9.5 04/14/2012 1952   GFRNONAA 3* 04/14/2012 1952   GFRAA 4* 04/14/2012 1952    INR    Component Value Date/Time   INR 1.03 04/10/2012 2047     Intake/Output Summary (Last 24 hours) at 04/16/12 0844 Last data filed at 04/15/12 1700  Gross per 24 hour  Intake    240 ml  Output     51 ml  Net    189 ml     Assessment/Plan:  78 y.o. female is s/p TE left femoral popliteal bypass graft-Gore-Tex, revision proximal and with shortening of graft, endarterectomy and patch angioplasty of distal anastomosis the popliteal artery POD 5 -on HD this am. -will plan on D/C after HD.   Evorn Gong, PA-C [facility] [phone] 04/16/2012 8:44 AM     Called by Dr. Moshe Cipro - pt had HR of 130 on HD with some confusion. HD stopped.  Called [facility] to see If confusion persists may need CT head Will hold DC     Addendum  I have independently interviewed and examined the patient, and I agree with the physician assistant's findings.  Cardiology called to evaluate the patient.  D/C on hold.  [name], MD [facility] [place] Office: [phone] Pager: [phone]  04/16/2012, 1:04 PM

## 2012-04-16 NOTE — Progress Notes (Signed)
PT vomited about 500cc, at the time pt heart rate increased to 120's. After vomiting, heart rate returned to the 80's. Attempted to give pt zofran, but IV infiltrated with flush check. Pt refused to have IV restarted. Called MD on call, received order for no IV per pt refusing. Educated pt about the importance of an IV, pt still refused. Removed IV, catheter intact. Site has no swelling or redness. Will continue to monitor site.

## 2012-04-16 NOTE — Progress Notes (Signed)
Subjective:  Seen on HD having increased HR acutely accompanied by some confusion, needed to stop HD after 1 1/2 hours.  Is moving all  4 extremities but speech is a little garbled as well.  Plan to check enzymes and will give her AM dose of toprol. No pain meds/anxiety meds given per nursing.    Objective Vital signs in last 24 hours: Filed Vitals:   04/16/12 0757 04/16/12 0800 04/16/12 0830 04/16/12 0900  BP: 152/63 145/61 134/71 154/78  Pulse: 69 73 80 108  Temp:      TempSrc:      Resp: 12 20 11 11   Height:      Weight:      SpO2:       Weight change: -1.9 kg (-4 lb 3 oz)  Intake/Output Summary (Last 24 hours) at 04/16/12 0935 Last data filed at 04/15/12 1700  Gross per 24 hour  Intake    240 ml  Output     51 ml  Net    189 ml   Labs: Basic Metabolic Panel:  Lab [id number] 0818 04/14/12 1952 04/13/12 0700  NA 133* 133* 136  K 5.1 5.6* 4.9  CL 95* 94* 98  CO2 25 23 22   GLUCOSE 126* 114* 97  BUN 34* 48* 28*  CREATININE 8.96* 10.04* 6.54*  CALCIUM 9.9 9.5 9.7  ALB -- -- --  PHOS 7.0* 8.5* 7.4*   Liver Function Tests:  Lab 04/16/12 0818 04/14/12 1952 04/13/12 0700 04/10/12 2047  AST -- -- -- 11  ALT -- -- -- 13  ALKPHOS -- -- -- 77  BILITOT -- -- -- 0.1*  PROT -- -- -- 7.6  ALBUMIN 2.9* 3.0* 3.0* --    Lab 04/10/12 2047  LIPASE 35  AMYLASE 64   No results found for this basename: AMMONIA:3 in the last 168 hours CBC:  Lab 04/16/12 0818 04/15/12 1046 04/14/12 1952 04/13/12 0700 04/12/12 1400  WBC 6.0 5.7 6.5 -- --  NEUTROABS -- -- -- -- --  HGB 10.0* 10.7* 9.8* -- --  HCT 30.6* 33.5* 30.0* -- --  MCV 99.7 100.9* 98.0 99.7 100.0  PLT 231 214 223 -- --   Cardiac Enzymes: No results found for this basename: CKTOTAL:5,CKMB:5,CKMBINDEX:5,TROPONINI:5 in the last 168 hours CBG:  Lab 04/16/12 0541 04/15/12 2057 04/15/12 1628 04/15/12 1123 04/15/12 0634  GLUCAP 129* 164* 113* 129* 96    Iron Studies: No results found for this basename:  IRON,TIBC,TRANSFERRIN,FERRITIN in the last 72 hours Studies/Results: No results found. Medications: Infusions:    . DOPamine      Scheduled Medications:    . amLODipine  10 mg Oral Daily  . calcium acetate  2,001 mg Oral TID WC  . cloNIDine  0.1 mg Oral QHS  . darbepoetin (ARANESP) injection - DIALYSIS  100 mcg Intravenous Q Mon-HD  . docusate sodium  100 mg Oral Daily  . ferric gluconate (FERRLECIT/NULECIT) IV  62.5 mg Intravenous Q Mon-HD  . metoprolol succinate  50 mg Oral Daily  . multivitamin  1 tablet Oral QHS  . pantoprazole  40 mg Oral Daily  . paricalcitol  3 mcg Intravenous Q M,W,F-HD  . promethazine        have reviewed scheduled and prn medications.  Physical Exam: General: agitated, moving all 4 extremities, a little garbled speech Heart: tachy as high as 140, looks regular Lungs: mostly clear Abdomen: soft, NT Extremities: no significant edema Dialysis Access: R femoral PC   I Assessment/ Plan: Pt  is a 78 y.o. yo female ESRD who was admitted on 04/10/2012 with and occluded fem pop bypass req operative management  Assessment/Plan: 1. Occluded fem-pop bypass - s/p thrombectomy/endarterectomy/revision-  Seemed to be doing well clinically, plans per [facility].  This episode today is different, will rule out MI and continue to observe.  Consider CT of head if doesn't clear.  D/C planned for today will inform primary team that she needs more inpatient time 2. ESRD - MWF via femoral PC, normally at [facility]. Had to cut HD short today 3. Anemia- blood count dropping, not surprising with surgery- on aranesp as well as iron 4. Secondary hyperparathyroidism-increased binders for phos of 7. On zemplar with HD as well 5. HTN/volume- BP been very well controlled, trying to wean clonidine.  Have only on qhs now, would continue that as OP.  Got her Toprol yesterday, gave today as well.  BP is OK 6. DM- good control 7. Episode- will rule out, watch on tele and keep in hospital for  now   [place] A   04/16/2012,9:35 AM  LOS: 6 days

## 2012-04-16 NOTE — Procedures (Signed)
Patient was seen on dialysis and the procedure was supervised.  HD had to be stopped prematurely due to elevated HR and confusion.  She was given Toprol and will check cardiac enzymes.    [name] A 04/16/2012

## 2012-04-16 NOTE — Discharge Summary (Signed)
[facility] Discharge Summary  [name] 19-Sep-1934 78 y.o. female  [id number]  Admission Date: 04/10/2012  Discharge Date: 04/16/12  Physician: No att. providers found  Admission Diagnosis: occluted lt fem pob graft clotted graft   HPI:   This is a 78 y.o. female with occluded left femoral-popliteal bypass graft placed in June of 2012-sounds as if bypass has been occluded for at least one week as symptoms started last Saturday. She continues to complaint of an aching discomfort in the left pretibial region and left foot. Unlikely that thrombolysis will be satisfactory.  Hospital Course:  The patient was admitted to the hospital and taken to the operating room on 04/10/2012 - 04/11/2012 and underwent   #1 thrombectomy left femoral popliteal bypass graft-Gore-Tex  #2 revision proximal and with shortening of graft  #3 intraoperative arteriogram left leg  #4 endarterectomy and patch angioplasty of distal anastomosis the popliteal artery  The pt tolerated the procedure well and was transported to the PACU in good condition. By POD 1, she was doing well and was transferred to the telemetry floor.  Her discharge was delayed by one day due to he heart rate climbing to the 140s during HD.  It was stopped and a cardiology consult was obtained.  It was a secondary issue and not a primary cardiac arrhythmia and no evidence of coronary ischemia or signs or symptoms of PE.  Cardiology okay with pt discharge.   The remainder of the hospital course consisted of increasing ambulation and increasing intake of solids without difficulty.  CBC    Component Value Date/Time   WBC 5.7 04/15/2012 1046   WBC 6.2 11/06/2006 0920   RBC 3.32* 04/15/2012 1046   RBC 4.25 11/06/2006 0920   HGB 10.7* 04/15/2012 1046   HGB 12.2 11/06/2006 0920   HCT 33.5* 04/15/2012 1046   HCT 37.7 11/06/2006 0920   PLT 214 04/15/2012 1046   PLT 375 11/06/2006 0920   MCV 100.9* 04/15/2012 1046   MCV 88.7  11/06/2006 0920   MCH 32.2 04/15/2012 1046   MCH 28.7 11/06/2006 0920   MCHC 31.9 04/15/2012 1046   MCHC 32.4 11/06/2006 0920   RDW 13.8 04/15/2012 1046   RDW 15.5* 11/06/2006 0920   LYMPHSABS 1.7 06/18/2011 0533   LYMPHSABS 1.5 11/06/2006 0920   MONOABS 1.0 06/18/2011 0533   MONOABS 0.5 11/06/2006 0920   EOSABS 0.8* 06/18/2011 0533   EOSABS 0.2 11/06/2006 0920   BASOSABS 0.1 06/18/2011 0533   BASOSABS 0.0 11/06/2006 0920    BMET    Component Value Date/Time   NA 133* 04/14/2012 1952   K 5.6* 04/14/2012 1952   CL 94* 04/14/2012 1952   CO2 23 04/14/2012 1952   GLUCOSE 114* 04/14/2012 1952   BUN 48* 04/14/2012 1952   CREATININE 10.04* 04/14/2012 1952   CALCIUM 9.5 04/14/2012 1952   GFRNONAA 3* 04/14/2012 1952   GFRAA 4* 04/14/2012 1952     Discharge Instructions:   The patient is discharged to home with extensive instructions on wound care and progressive ambulation.  They are instructed not to drive or perform any heavy lifting until returning to see the physician in his office.  Discharge Orders    Future Appointments: Provider: Department: Dept Phone: Center:   04/24/2012 10:30 AM [facility]  None   04/24/2012 11:45 AM Burnell Blanks, MD [place] [phone] [facility]   04/29/2012 2:30 PM [facility] [phone] [facility]  04/29/2012 3:00 PM [facility] [id number] [facility]   04/29/2012 4:00 PM [name], MD [facility] [phone] [facility]   09/24/2012 3:00 PM [facility] [phone] [facility]     Future Orders Please Complete By Expires   Resume previous diet      Driving Restrictions      Comments:   No driving for 2 weeks and while taking pain medication   Lifting restrictions      Comments:   No lifting for 6 weeks   Call MD for:  temperature >100.5      Call MD for:  redness, tenderness, or signs of infection (pain, swelling, bleeding, redness, odor or green/yellow discharge around incision site)       Call MD for:  severe or increased pain, loss or decreased feeling  in affected limb(s)      may wash over wound with mild soap and water      Scheduling Instructions:   Shower daily with soap and water starting 04/16/12       Discharge Diagnosis:  occluted lt fem pob graft clotted graft  Secondary Diagnosis: Patient Active Problem List  Diagnoses  . DIABETES MELLITUS, TYPE II  . GOUT  . HYPERKALEMIA  . HYPERTENSION  . MITRAL VALVE DISORDERS  . BRADYCARDIA  . COPD  . RENAL FAILURE, END STAGE  . WALKING DIFFICULTY  . PALPITATIONS  . CHEST PAIN-PRECORDIAL  . Foot pain  . Peripheral vascular disease, unspecified  . Aftercare following surgery of the circulatory system, NEC  . Atherosclerosis of native arteries of the extremities with intermittent claudication   Past Medical History  Diagnosis Date  . Unspecified essential hypertension   . Hyperpotassemia   . Other specified cardiac dysrhythmias   . Difficulty in walking   . Gout, unspecified   . Type II or unspecified type diabetes mellitus without mention of complication, not stated as uncontrolled   . Mild mitral valve stenosis   . Arthritis   . Joint pain   . Leg pain   . Thyroid disease   . Gangrene     left fifth toe  . Peripheral vascular disease   . Anemia   . Hyperlipidemia   . Aortic valve sclerosis   . Shoulder fracture, right   . End stage renal disease     dialysis 02/25/12  . Blood transfusion       Otillie, [place]  Home Medication Instructions [id number]   Printed on:04/16/12 0804  Medication Information                    calcium acetate (PHOSLO) 667 MG capsule Take 667 mg by mouth 3 (three) times daily with meals.           amLODipine (NORVASC) 10 MG tablet Take 10 mg by mouth daily.           oxyCODONE (OXY IR/ROXICODONE) 5 MG immediate release tablet Take 1 tablet (5 mg total) by mouth every 6 (six) hours as needed. #30 NR          metoprolol succinate (TOPROL-XL) 50 MG 24 hr  tablet Take 1 tablet (50 mg total) by mouth daily. Take with or immediately following a meal.           cloNIDine (CATAPRES) 0.1 MG tablet Take 1 tablet (0.1 mg total) by mouth at bedtime.            *PT IS GIVEN ONE MONTH RX FOR TOPROL AND  CLONIDINE.  Refills should be through renal or PCP.  Disposition: home  Patient's condition: is Good  Follow up: 1. Dr. Kellie Simmering in 2 weeks 2. Dr. Julianne Handler in 2 weeks.   Evorn Gong, PA-C [facility] [phone] 04/16/2012  8:04 AM

## 2012-04-16 NOTE — Progress Notes (Signed)
PT Cancellation Note  Treatment cancelled today due to patient's refusal to participate.  "The only strolling I want to do is to stroll out of here!"  The patient did not want to ambulate today, but is agreeable if she does not d/c tomorrow to work with PT.  PT to check back tomorrow as time allows.  Last PT recommending HHPT with 24 hour assist and RW at discharge.        Barbarann Ehlers [place], [place], DPT [phone] 04/16/2012, 4:49 PM

## 2012-04-16 NOTE — Progress Notes (Signed)
Utilization review completed. Susy Placzek, RN, BSN. 04/16/12  

## 2012-04-16 NOTE — Progress Notes (Signed)
PT Cancellation Note  Treatment cancelled today due to patient receiving procedure or test.  The patient is in HD. PT to check back later as time allows.    Barbarann Ehlers [place], [place], DPT [phone] 04/16/2012, 9:09 AM

## 2012-04-16 NOTE — Progress Notes (Signed)
Pt heart rate between 133-150 and not resolving; when it decreases to 130s but comes back up.Pt has episodes of disorientation with garbled  Speech. Dr. Moshe Cipro notified while in dialysis; ordered to stop tx and get CK/MB and Troponin.  Pt still tachycardic although speech is somewhat clear.

## 2012-04-17 ENCOUNTER — Ambulatory Visit: Payer: Medicare Other | Admitting: Cardiovascular Disease

## 2012-04-17 LAB — BASIC METABOLIC PANEL
BUN: 33 mg/dL — ABNORMAL HIGH (ref 6–23)
Calcium: 9.7 mg/dL (ref 8.4–10.5)
Creatinine, Ser: 8.17 mg/dL — ABNORMAL HIGH (ref 0.50–1.10)
GFR calc non Af Amer: 4 mL/min — ABNORMAL LOW (ref >90)
Glucose, Bld: 148 mg/dL — ABNORMAL HIGH (ref 70–99)
Potassium: 4.5 mEq/L (ref 3.5–5.1)

## 2012-04-17 LAB — CBC
HCT: 31.8 % — ABNORMAL LOW (ref 36.0–46.0)
Hemoglobin: 10.2 g/dL — ABNORMAL LOW (ref 12.0–15.0)
MCH: 32 pg (ref 26.0–34.0)
MCHC: 32.1 g/dL (ref 30.0–36.0)
MCV: 99.7 fL (ref 78.0–100.0)
RDW: 13.5 % (ref 11.5–15.5)

## 2012-04-17 LAB — GLUCOSE, CAPILLARY: Glucose-Capillary: 106 mg/dL — ABNORMAL HIGH (ref 70–99)

## 2012-04-17 NOTE — Progress Notes (Signed)
Some left foot pain otherwise no complaints  Blood pressure 111/62, pulse 86, temperature 98.7 F (37.1 C), temperature source Oral, resp. rate 16, height 4\' 10"  (1.473 m), weight 141 lb 1.5 oz (64 kg), SpO2 99.00%.  No cardiac events overnight Leg incisions healing Popliteal pulse Foot warm  D/c home today.  Ruta Hinds, MD [facility] [place] Office: [phone] Pager: [phone]

## 2012-04-17 NOTE — Progress Notes (Signed)
Pt refused morning lab draw, educated pt on the importance of the blood collection, pt still refused. Pt also refused her Phoslo medication.

## 2012-04-17 NOTE — Progress Notes (Signed)
Subjective:  Events noted.  Patient seems back to baseline.  Is refusing things, had negative cardiac enzymes-  Wants to go home.   Objective Vital signs in last 24 hours: Filed Vitals:   04/16/12 0928 04/16/12 1341 04/16/12 2137 04/17/12 0536  BP: 150/85 150/68 149/76 111/62  Pulse: 129 88 83 86  Temp:  98.2 F (36.8 C) 98 F (36.7 C) 98.7 F (37.1 C)  TempSrc:  Oral Oral Oral  Resp: 16 18 16 16   Height:      Weight: 64.8 kg (142 lb 13.7 oz)   64 kg (141 lb 1.5 oz)  SpO2: 100% 99% 97% 99%   Weight change: 1.1 kg (2 lb 6.8 oz)  Intake/Output Summary (Last 24 hours) at 04/17/12 0839 Last data filed at 04/16/12 2130  Gross per 24 hour  Intake    480 ml  Output    428 ml  Net     52 ml   Labs: Basic Metabolic Panel:  Lab [id number] 0818 04/14/12 1952 04/13/12 0700  NA 133* 133* 136  K 5.1 5.6* 4.9  CL 95* 94* 98  CO2 25 23 22   GLUCOSE 126* 114* 97  BUN 34* 48* 28*  CREATININE 8.96* 10.04* 6.54*  CALCIUM 9.9 9.5 9.7  ALB -- -- --  PHOS 7.0* 8.5* 7.4*   Liver Function Tests:  Lab 04/16/12 0818 04/14/12 1952 04/13/12 0700 04/10/12 2047  AST -- -- -- 11  ALT -- -- -- 13  ALKPHOS -- -- -- 77  BILITOT -- -- -- 0.1*  PROT -- -- -- 7.6  ALBUMIN 2.9* 3.0* 3.0* --    Lab 04/10/12 2047  LIPASE 35  AMYLASE 64   No results found for this basename: AMMONIA:3 in the last 168 hours CBC:  Lab 04/16/12 0818 04/15/12 1046 04/14/12 1952 04/13/12 0700 04/12/12 1400  WBC 6.0 5.7 6.5 -- --  NEUTROABS -- -- -- -- --  HGB 10.0* 10.7* 9.8* -- --  HCT 30.6* 33.5* 30.0* -- --  MCV 99.7 100.9* 98.0 99.7 100.0  PLT 231 214 223 -- --   Cardiac Enzymes:  Lab 04/16/12 1015  CKTOTAL 55  CKMB 3.0  CKMBINDEX --  TROPONINI <0.30   CBG:  Lab 04/17/12 0617 04/16/12 2140 04/16/12 1634 04/16/12 1145 04/16/12 0541  GLUCAP 106* 114* 107* 146* 129*    Iron Studies: No results found for this basename: IRON,TIBC,TRANSFERRIN,FERRITIN in the last 72 hours Studies/Results: No results  found. Medications: Infusions:    . DOPamine      Scheduled Medications:    . amLODipine  10 mg Oral Daily  . calcium acetate  2,001 mg Oral TID WC  . cloNIDine  0.1 mg Oral QHS  . darbepoetin (ARANESP) injection - DIALYSIS  100 mcg Intravenous Q Mon-HD  . docusate sodium  100 mg Oral Daily  . ferric gluconate (FERRLECIT/NULECIT) IV  62.5 mg Intravenous Q Mon-HD  . metoprolol succinate  50 mg Oral Daily  . multivitamin  1 tablet Oral QHS  . pantoprazole  40 mg Oral Daily  . paricalcitol  3 mcg Intravenous Q M,W,F-HD  . promethazine        have reviewed scheduled and prn medications.  Physical Exam: General: seems back to baseline Heart: RRR Lungs: mostly clear Abdomen: soft, NT Extremities: no significant edema Dialysis Access: R femoral PC   I Assessment/ Plan: Pt is a 78 y.o. yo female ESRD who was admitted on 04/10/2012 with and occluded fem pop bypass req operative  management  Assessment/Plan: 1. Occluded fem-pop bypass - s/p thrombectomy/endarterectomy/revision-  Seemed to be doing well clinically, plans per [facility].  Was ready for discharge until episode yesterday but now seems to be back at baseline.   2. ESRD - MWF via femoral PC, normally at [facility]. Had to cut HD short yesterday but should be fine.  Next treatment planned for tomorrow at OP unit  3. Anemia- blood count dropping, not surprising with surgery- on aranesp as well as iron.  Can follow as OP 4. Secondary hyperparathyroidism-increased binders for phos of 7. On zemplar with HD as well 5. HTN/volume- BP been very well controlled, trying to wean clonidine.  Have only on qhs now, would continue that as OP.  Got her Toprol yesterday, gave today as well.  BP is OK 6. DM- good control 7. Episode- now back to baseline, appreciate cards assist.   Patient OK for discharge today  from a renal standpoint.     [name] A   04/17/2012,8:39 AM  LOS: 7 days

## 2012-04-17 NOTE — Progress Notes (Signed)
Physical Therapy Treatment Patient Details Name: [name] MRN: [id number] DOB: May 07, 1934 Today's Date: 04/17/2012  PT Assessment/Plan  PT - Assessment/Plan Comments on Treatment Session: Pt progressing well s/p fempop BPG however reluctant to participate or to increase ambulation distance. Pt states she is relatively sedentary and does not like to walk outside the house other than at a store. Pt educated for ROM and strengthening and encouraged to continue as well as for deficits of gait and potential benefit of HHPT. Pt states she will refuse HHPT and that she already has a RW at home.  PT Plan: Discharge plan needs to be updated;Frequency remains appropriate Follow Up Recommendations: Home health PT Equipment Recommended: None recommended by PT PT Goals  Acute Rehab PT Goals Pt will Ambulate: >150 feet;Independently PT Goal: Ambulate - Progress: Updated due to goal met PT Goal: Up/Down Stairs - Progress: Met  PT Treatment Precautions/Restrictions  Restrictions Weight Bearing Restrictions: No Mobility (including Balance) Bed Mobility Supine to Sit: 6: Modified independent (Device/Increase time);HOB flat Sit to Supine: 6: Modified independent (Device/Increase time);HOB flat;With rail Transfers Sit to Stand: 6: Modified independent (Device/Increase time);From bed Stand to Sit: 6: Modified independent (Device/Increase time);To bed Ambulation/Gait Ambulation/Gait Assistance: 6: Modified independent (Device/Increase time) Ambulation/Gait Assistance Details (indicate cue type and reason): pt with decreased speed and antalgic gait. Did not ambulate with RW because pt states she will not use it at home.  Ambulation Distance (Feet): 250 Feet Assistive device: None Gait Pattern: Step-through pattern;Antalgic;Decreased stride length;Decreased stance time - left Stairs: Yes Stairs Assistance: 6: Modified independent (Device/Increase time) Stair Management Technique: One rail  Right;Sideways Number of Stairs: 11  Height of Stairs: 8     Exercise  General Exercises - Lower Extremity Long Arc Quad: AROM;Left;20 reps;Seated Hip Flexion/Marching: AROM;Left;Other reps (comment);Seated (20reps) End of Session PT - End of Session Activity Tolerance: Patient tolerated treatment well Patient left: in bed;with call bell in reach General Behavior During Session: [facility] for tasks performed Cognition: [facility] for tasks performed  Melford Aase 04/17/2012, 9:55 AM Elwyn Reach, [place]

## 2012-04-17 NOTE — Progress Notes (Signed)
DC home with all instructions, prescriptions given and reviewed; f/u appts in place.

## 2012-04-18 NOTE — Procedures (Unsigned)
BYPASS GRAFT EVALUATION  INDICATION:  Followup left lower extremity bypass graft.  HISTORY: Diabetes:  Yes Cardiac:  No Hypertension:  Yes Smoking:  Previous Previous Surgery:  Left femoral to popliteal bypass graft 06/14/2011, left fifth digit removal.  SINGLE LEVEL ARTERIAL EXAM                              RIGHT              LEFT Brachial: Anterior tibial: Posterior tibial: Peroneal: Ankle/brachial index:        1.33 TBI=0.65      TBI=not detected  PREVIOUS ABI:  Date:  RIGHT:  LEFT:  LOWER EXTREMITY BYPASS GRAFT DUPLEX EXAM:  DUPLEX:  No flow visualized within the left lower extremity bypass graft.  IMPRESSION:  Probable occlusion of left femoral to popliteal bypass graft. Right ABI suggestive of calcified vessels. Left ABI and TBI were not detected.  ___________________________________________ Jessy Oto. Fields, MD  EM/MEDQ  D:  04/11/2012  T:  04/11/2012  Job:  [id number]

## 2012-04-24 ENCOUNTER — Other Ambulatory Visit (HOSPITAL_COMMUNITY): Payer: Self-pay | Admitting: Radiology

## 2012-04-24 ENCOUNTER — Other Ambulatory Visit: Payer: Self-pay

## 2012-04-24 ENCOUNTER — Ambulatory Visit (HOSPITAL_COMMUNITY): Payer: Medicare Other | Attending: Cardiovascular Disease

## 2012-04-24 ENCOUNTER — Ambulatory Visit (INDEPENDENT_AMBULATORY_CARE_PROVIDER_SITE_OTHER): Payer: Medicare Other | Admitting: Cardiovascular Disease

## 2012-04-24 ENCOUNTER — Encounter: Payer: Self-pay | Admitting: Cardiovascular Disease

## 2012-04-24 DIAGNOSIS — I059 Rheumatic mitral valve disease, unspecified: Secondary | ICD-10-CM

## 2012-04-24 DIAGNOSIS — J4489 Other specified chronic obstructive pulmonary disease: Secondary | ICD-10-CM | POA: Insufficient documentation

## 2012-04-24 DIAGNOSIS — R079 Chest pain, unspecified: Secondary | ICD-10-CM | POA: Insufficient documentation

## 2012-04-24 DIAGNOSIS — I079 Rheumatic tricuspid valve disease, unspecified: Secondary | ICD-10-CM | POA: Insufficient documentation

## 2012-04-24 DIAGNOSIS — I129 Hypertensive chronic kidney disease with stage 1 through stage 4 chronic kidney disease, or unspecified chronic kidney disease: Secondary | ICD-10-CM | POA: Insufficient documentation

## 2012-04-24 DIAGNOSIS — E119 Type 2 diabetes mellitus without complications: Secondary | ICD-10-CM | POA: Insufficient documentation

## 2012-04-24 DIAGNOSIS — N189 Chronic kidney disease, unspecified: Secondary | ICD-10-CM | POA: Insufficient documentation

## 2012-04-24 DIAGNOSIS — I739 Peripheral vascular disease, unspecified: Secondary | ICD-10-CM

## 2012-04-24 DIAGNOSIS — J449 Chronic obstructive pulmonary disease, unspecified: Secondary | ICD-10-CM | POA: Insufficient documentation

## 2012-04-24 NOTE — Patient Instructions (Signed)
Your physician wants you to follow-up in: 6 months.   You will receive a reminder letter in the mail two months in advance. If you don't receive a letter, please call our office to schedule the follow-up appointment.  Your physician has requested that you have a lexiscan myoview. For further information please visit [email or web address]. Please follow instruction sheet, as given.   

## 2012-04-24 NOTE — Assessment & Plan Note (Addendum)
She has multiple risk factors for CAD including HTN, DM and ESRD on HD with known PAD. Will arrange Lexiscan  Stress myoview to exclude ischemia.

## 2012-04-24 NOTE — Assessment & Plan Note (Signed)
Per [facility]

## 2012-04-24 NOTE — Assessment & Plan Note (Signed)
Will f/u on echo results today and let pt know.
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
## 2012-04-28 ENCOUNTER — Encounter: Payer: Self-pay | Admitting: Vascular Surgery

## 2012-04-29 ENCOUNTER — Other Ambulatory Visit: Payer: Medicare Other

## 2012-04-29 ENCOUNTER — Ambulatory Visit (INDEPENDENT_AMBULATORY_CARE_PROVIDER_SITE_OTHER): Payer: Medicare Other | Admitting: Vascular Surgery

## 2012-04-29 ENCOUNTER — Encounter: Payer: Self-pay | Admitting: Vascular Surgery

## 2012-04-29 VITALS — BP 149/75 | HR 63 | Temp 99.0°F | Ht <= 58 in | Wt 142.0 lb

## 2012-04-29 DIAGNOSIS — Z48812 Encounter for surgical aftercare following surgery on the circulatory system: Secondary | ICD-10-CM

## 2012-04-29 DIAGNOSIS — I739 Peripheral vascular disease, unspecified: Secondary | ICD-10-CM

## 2012-04-29 NOTE — Progress Notes (Signed)
Subjective:     Patient ID: [name], female   DOB: 1934-10-07, 78 y.o.   MRN: [id number]  HPI this 78 year old female returns for initial followup regarding her extensive procedure on the left leg on April 12. She had a revision of the proximal portion of the thrombosed left femoral-popliteal bypass graft with shortening of the graft. She then had exploration of the distal end with Dacron patch angioplasty and localized endarterectomy of the distal anastomosis the popliteal artery level. The graft has functioned nicely since then. She has no rest pain. She does have some edema in the left lower leg since the surgery. She has had no chills and fever. She is ambulating increasingly without help   Review of Systems     Objective:   Physical ExamBP 149/75  Pulse 63  Temp(Src) 99 F (37.2 C) (Oral)  Ht 4\' 10"  (1.473 m)  Wt 142 lb (64.411 kg)  BMI 29.68 kg/m2  General alert and oriented x3 in no apparent distress Left lower extremity inguinal wound healing nicely below knee popliteal wound healing nicely 2+ popliteal and 2+ dorsalis pedis pulse palpable left foot well perfused with 1+ edema    Assessment:     Doing well post revision left femoral-popliteal bypass graft with endarterectomy and patch angioplasty distal anastomosis and shortening of the graft proximally    Plan:     Return in 2 months with ABIs and duplex scan of the graft to be seen by nurse practitioner and then continued surveillance

## 2012-04-30 NOTE — Progress Notes (Signed)
Addended by: Mena Goes on: 04/30/2012 08:24 AM   Modules accepted: Orders

## 2012-05-06 ENCOUNTER — Ambulatory Visit (HOSPITAL_COMMUNITY): Payer: Medicare Other | Attending: Cardiology | Admitting: Radiology

## 2012-05-06 DIAGNOSIS — R079 Chest pain, unspecified: Secondary | ICD-10-CM | POA: Insufficient documentation

## 2012-05-06 DIAGNOSIS — E119 Type 2 diabetes mellitus without complications: Secondary | ICD-10-CM | POA: Insufficient documentation

## 2012-05-06 DIAGNOSIS — E785 Hyperlipidemia, unspecified: Secondary | ICD-10-CM | POA: Insufficient documentation

## 2012-05-06 DIAGNOSIS — J45909 Unspecified asthma, uncomplicated: Secondary | ICD-10-CM | POA: Insufficient documentation

## 2012-05-06 DIAGNOSIS — I10 Essential (primary) hypertension: Secondary | ICD-10-CM | POA: Insufficient documentation

## 2012-05-06 DIAGNOSIS — I739 Peripheral vascular disease, unspecified: Secondary | ICD-10-CM | POA: Insufficient documentation

## 2012-05-06 DIAGNOSIS — Z87891 Personal history of nicotine dependence: Secondary | ICD-10-CM | POA: Insufficient documentation

## 2012-05-06 MED ORDER — TECHNETIUM TC 99M TETROFOSMIN IV KIT
10.0000 | PACK | Freq: Once | INTRAVENOUS | Status: AC | PRN
  Administered 2012-05-06: 10 via INTRAVENOUS

## 2012-05-06 MED ORDER — REGADENOSON 0.4 MG/5ML IV SOLN
0.4000 mg | Freq: Once | INTRAVENOUS | Status: AC
  Administered 2012-05-06: 0.4 mg via INTRAVENOUS

## 2012-05-06 MED ORDER — TECHNETIUM TC 99M TETROFOSMIN IV KIT
30.0000 | PACK | Freq: Once | INTRAVENOUS | Status: AC | PRN
  Administered 2012-05-06: 30 via INTRAVENOUS

## 2012-05-06 NOTE — Progress Notes (Signed)
[facility] [place] [address] [phone]  Cardiology Nuclear Med Study  [name] is a 78 y.o. female     MRN : [id number]     DOB: 09/13/1934  Procedure Date: 05/06/2012  Nuclear Med Background Indication for Stress Test:  Evaluation for Ischemia History:  Asthma, 12/17/07 MPS: EF: 73% (-) scar ischemia, 04/24/12, ECHO: EF: 50-55% Cardiac Risk Factors: Claudication, History of Smoking, Hypertension, Lipids, NIDDM and PVD  Symptoms:  Chest Pain   Nuclear Pre-Procedure Caffeine/Decaff Intake:  None> 12 hrs NPO After: 7:00pm   Lungs:  clear O2 Sat: 95% on room air. IV 0.9% NS with Angio Cath:  24g  IV Site: R Forearm  IV Started by:  Eliezer Lofts, EMT-P  Chest Size (in):  38 Cup Size: D  Height: 4\' 10"  (1.473 m)  Weight:  142 lb (64.411 kg)  BMI:  Body mass index is 29.68 kg/(m^2). Tech Comments:  Toprol is taken qhs, per patient.    Nuclear Med Study 1 or 2 day study: 1 day  Stress Test Type:  Lexiscan  Reading MD: Dola Argyle, MD  Order Authorizing Provider:  Lauree Chandler, MD  Resting Radionuclide: Technetium 25m Tetrofosmin  Resting Radionuclide Dose: 11.0 mCi   Stress Radionuclide:  Technetium 38m Tetrofosmin  Stress Radionuclide Dose: 32.9 mCi           Stress Protocol Rest HR: 52 Stress HR: 77  Rest BP: 130/73 Stress BP: 150/77  Exercise Time (min): n/a METS: n/a   Predicted Max HR: 145 bpm % Max HR: 53.1 bpm Rate Pressure Product: 11550   Dose of Adenosine (mg):  n/a Dose of Lexiscan: 0.4 mg  Dose of Atropine (mg): n/a Dose of Dobutamine: n/a mcg/kg/min (at max HR)  Stress Test Technologist: Perrin Maltese, EMT-P  Nuclear Technologist:  Charlton Amor, CNMT     Rest Procedure:  Myocardial perfusion imaging was performed at rest 45 minutes following the intravenous administration of Technetium 15m Tetrofosmin. Rest ECG: Sinus Bradycardia  Stress Procedure:  The patient received IV  Lexiscan 0.4 mg over 15-seconds.  Technetium 56m Tetrofosmin injected at 30-seconds.  There were non specific changes with Lexiscan.  Quantitative spect images were obtained after a 45 minute delay. Stress ECG: No significant ST segment change suggestive of ischemia.  QPS Raw Data Images:  Normal; no motion artifact; normal heart/lung ratio. Stress Images:  Normal homogeneous uptake in all areas of the myocardium. Rest Images:  Normal homogeneous uptake in all areas of the myocardium. Subtraction (SDS):  No evidence of ischemia. Transient Ischemic Dilatation (Normal <1.22): 1.19 Lung/Heart Ratio (Normal <0.45):  0.23  Quantitative Gated Spect Images QGS EDV:  46 ml QGS ESV:  12 ml  Impression Exercise Capacity:  Lexiscan with no exercise. BP Response:  Normal blood pressure response. Clinical Symptoms:  Fatigue ECG Impression:  No significant ST segment change suggestive of ischemia. Comparison with Prior Nuclear Study: No significant change from previous study of 2008.  Overall Impression:  Normal stress nuclear study.  LV Ejection Fraction: 73%.  LV Wall Motion:  Normal Wall Motion  Dola Argyle, MD

## 2012-06-30 ENCOUNTER — Encounter: Payer: Self-pay | Admitting: Neurosurgery

## 2012-07-01 ENCOUNTER — Encounter (INDEPENDENT_AMBULATORY_CARE_PROVIDER_SITE_OTHER): Payer: Medicare Other | Admitting: *Deleted

## 2012-07-01 ENCOUNTER — Ambulatory Visit (INDEPENDENT_AMBULATORY_CARE_PROVIDER_SITE_OTHER): Payer: Medicare Other | Admitting: Neurosurgery

## 2012-07-01 ENCOUNTER — Encounter: Payer: Self-pay | Admitting: Neurosurgery

## 2012-07-01 VITALS — BP 158/71 | HR 50 | Resp 14 | Ht <= 58 in | Wt 144.6 lb

## 2012-07-01 DIAGNOSIS — I739 Peripheral vascular disease, unspecified: Secondary | ICD-10-CM

## 2012-07-01 DIAGNOSIS — Z48812 Encounter for surgical aftercare following surgery on the circulatory system: Secondary | ICD-10-CM
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
## 2012-07-07 NOTE — Procedures (Unsigned)
BYPASS GRAFT EVALUATION  INDICATION:  Followup revised left fem-pop graft.  HISTORY: Diabetes:  Yes Cardiac: Hypertension:  Yes Smoking:  Previous Previous Surgery:  Left fem-pop graft 06/14/2011 with revision 04/13/2011  SINGLE LEVEL ARTERIAL EXAM                              RIGHT              LEFT Brachial: Anterior tibial: Posterior tibial: Peroneal: Ankle/brachial index:  TOE BRACHIAL INDEX RIGHT:  0.37  TOE BRACHIAL INDEX LEFT:  0.68  PREVIOUS TBI:  Date:  04/11/2012  RIGHT:  0.65  LEFT:  Not detected  LOWER EXTREMITY BYPASS GRAFT DUPLEX EXAM:  DUPLEX:  Patent left femoral-popliteal graft with difficult visualization in areas due to graft depth and surgical scarring. Native artery inflow is irregular with diffuse calcific disease.  No focal stenosis is observed. All waveforms are biphasic. Of note, there has been a significant decrease of the right toe brachial index since previous exam.  IMPRESSION: 1. Patent left femoral-popliteal graft with native arterial inflow     disease as described above. 2. Portions of the graft were difficult to visualize.  ___________________________________________ Nelda Severe. Kellie Simmering, M.D.  LT/MEDQ  D:  07/01/2012  T:  07/01/2012  Job:  [id number]

## 2012-09-09 ENCOUNTER — Ambulatory Visit
Admission: RE | Admit: 2012-09-09 | Discharge: 2012-09-09 | Disposition: A | Discharge status: Home / Self Care | Payer: Medicare Other | Source: Ambulatory Visit | Attending: Family Medicine | Admitting: Family Medicine

## 2012-09-09 ENCOUNTER — Other Ambulatory Visit: Payer: Self-pay | Admitting: Family Medicine

## 2012-09-09 DIAGNOSIS — R109 Unspecified abdominal pain: Secondary | ICD-10-CM

## 2012-09-16 ENCOUNTER — Telehealth: Payer: Self-pay

## 2012-09-16 NOTE — Telephone Encounter (Signed)
Phone call from pt. to report sensation that "left leg feels like there is a knot on the inside of the leg".  States noticed this on Saturday for the 1st time.  Stated the episode "lasted about 2-3 seconds, and comes and goes".  Denies swelling of left lower extremity, or change in color.  Stated that she noticed the episode more when she was standing on feet for prolonged period of time.   Has appt. 10/21/12.  Discussed w/ Dr. Kellie Simmering.  Advised to bring pt. In sooner for her f/u LE arterial duplex and ABI's, and to see the Nurse Practitioner.

## 2012-09-18 ENCOUNTER — Other Ambulatory Visit: Payer: Self-pay

## 2012-09-19 ENCOUNTER — Encounter (HOSPITAL_COMMUNITY): Payer: Self-pay | Admitting: Pharmacy Technician

## 2012-09-24 ENCOUNTER — Other Ambulatory Visit: Payer: Medicare Other

## 2012-09-24 MED ORDER — SODIUM CHLORIDE 0.9 % IJ SOLN: 3.0000 mL | INTRAMUSCULAR | Status: DC | PRN

## 2012-09-25 ENCOUNTER — Encounter (HOSPITAL_COMMUNITY)
Admission: RE | Disposition: A | Discharge status: Home / Self Care | Payer: Self-pay | Source: Ambulatory Visit | Attending: Vascular Surgery

## 2012-09-25 ENCOUNTER — Other Ambulatory Visit: Payer: Medicare Other

## 2012-09-25 ENCOUNTER — Telehealth: Payer: Self-pay | Admitting: Vascular Surgery

## 2012-09-25 ENCOUNTER — Ambulatory Visit (HOSPITAL_COMMUNITY)
Admission: RE | Admit: 2012-09-25 | Discharge: 2012-09-25 | Disposition: A | Discharge status: Home / Self Care | Payer: Medicare Other | Source: Ambulatory Visit | Attending: Vascular Surgery | Admitting: Vascular Surgery

## 2012-09-25 ENCOUNTER — Other Ambulatory Visit: Payer: Self-pay | Admitting: *Deleted

## 2012-09-25 DIAGNOSIS — N186 End stage renal disease: Secondary | ICD-10-CM

## 2012-09-25 DIAGNOSIS — I120 Hypertensive chronic kidney disease with stage 5 chronic kidney disease or end stage renal disease: Secondary | ICD-10-CM | POA: Insufficient documentation

## 2012-09-25 DIAGNOSIS — I359 Nonrheumatic aortic valve disorder, unspecified: Secondary | ICD-10-CM | POA: Insufficient documentation

## 2012-09-25 DIAGNOSIS — E119 Type 2 diabetes mellitus without complications: Secondary | ICD-10-CM | POA: Insufficient documentation

## 2012-09-25 DIAGNOSIS — Z01810 Encounter for preprocedural cardiovascular examination: Secondary | ICD-10-CM

## 2012-09-25 DIAGNOSIS — I059 Rheumatic mitral valve disease, unspecified: Secondary | ICD-10-CM | POA: Insufficient documentation

## 2012-09-25 LAB — POCT I-STAT, CHEM 8
HCT: 37 % (ref 36.0–46.0)
Hemoglobin: 12.6 g/dL (ref 12.0–15.0)
Potassium: 4.7 mEq/L (ref 3.5–5.1)
Sodium: 137 mEq/L (ref 135–145)

## 2012-09-25 SURGERY — VENOGRAM EXTREMITY BILATERAL
Laterality: Bilateral

## 2012-09-25 MED ORDER — ACETAMINOPHEN 325 MG PO TABS: 650.0000 mg | ORAL_TABLET | ORAL | Status: DC | PRN

## 2012-09-25 MED ORDER — SODIUM CHLORIDE 0.9 % IJ SOLN: 3.0000 mL | Freq: Two times a day (BID) | INTRAMUSCULAR | Status: DC

## 2012-09-25 MED ORDER — SODIUM CHLORIDE 0.9 % IV SOLN: 250.0000 mL | INTRAVENOUS | Status: DC | PRN

## 2012-09-25 MED ORDER — SODIUM CHLORIDE 0.9 % IJ SOLN: 3.0000 mL | INTRAMUSCULAR | Status: DC | PRN

## 2012-09-25 MED ORDER — ONDANSETRON HCL 4 MG/2ML IJ SOLN: 4.0000 mg | Freq: Four times a day (QID) | INTRAMUSCULAR | Status: DC | PRN

## 2012-09-25 NOTE — Telephone Encounter (Signed)
Message copied by Berniece Salines on Thu Sep 25, 2012  3:04 PM ------      Message from: Alfonso Patten      Created: Thu Sep 25, 2012 12:32 PM                   ----- Message -----         From: Conrad [place], MD         Sent: 09/25/2012   9:56 AM           To: Patrici Ranks, Alfonso Patten, RN            [name]      [id number]      [birth year]            PROCEDURE:      1. bilateral arm and central venogram             Follow-up: 2 weeks            Orders(s) for follow-up: R arm arterial duplex

## 2012-09-25 NOTE — Op Note (Signed)
OPERATIVE NOTE   PROCEDURE: 1. bilateral arm and central venogram   PRE-OPERATIVE DIAGNOSIS: end stage renal disease  POST-OPERATIVE DIAGNOSIS: same as above   SURGEON: [name], MD  ANESTHESIA: local  ESTIMATED BLOOD LOSS: 5 cc  FINDING(S): 1. Patent right axillary vein: 5 mm 2. Patent right subclavian vein and innominate vein Superior vena cava not well visualized but presumed patent as contrast drains rapidly between images Occluded left axillary vein without subclavian and innominate vein  SPECIMEN(S):  None  CONTRAST: 27 cc  INDICATIONS: [name] is a 78 y.o. female who  presents with end stage renal disease.  The patient is scheduled for bilateral venogram to help determine the availability of proximal veins for permanent access placement.  The patient is aware the risks include but are not limited to: bleeding, infection, thrombosis of the cannulated access, and possible anaphylactic reaction to the contrast.  The patient is aware of the risks of the procedure and elects to proceed forward.  DESCRIPTION: After full informed written consent was obtained, the patient was brought back to the angiography suite and placed supine upon the angiography table.  The patient was connected to monitoring equipment.  The right forearm IV was connected to IV extension tubing.  Hand injections were completed to image the arm veins and central venous structures, the findings of which are listed above.  The left upper arm IV was connected to IV extension tubing.  Hand injections were completed to image the arm veins and central venous structures, the findings of which are listed above.  Based on the images, she may be a candidate for a right arm Gore hybrid graft.  COMPLICATIONS: none  CONDITION: stable  [name], MD [facility] Office: [phone] Pager: [phone]  09/25/2012 9:52 AM
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
## 2012-09-26 ENCOUNTER — Encounter (HOSPITAL_COMMUNITY): Payer: Self-pay

## 2012-09-30 ENCOUNTER — Ambulatory Visit: Payer: Medicare Other | Admitting: Neurosurgery

## 2012-09-30 ENCOUNTER — Ambulatory Visit: Payer: Medicare Other | Admitting: Vascular Surgery

## 2012-10-06 ENCOUNTER — Encounter: Payer: Self-pay | Admitting: Vascular Surgery

## 2012-10-07 ENCOUNTER — Other Ambulatory Visit: Payer: Self-pay

## 2012-10-07 ENCOUNTER — Encounter: Payer: Self-pay | Admitting: Vascular Surgery

## 2012-10-07 ENCOUNTER — Encounter (HOSPITAL_COMMUNITY): Payer: Self-pay | Admitting: Pharmacy Technician

## 2012-10-07 ENCOUNTER — Ambulatory Visit (INDEPENDENT_AMBULATORY_CARE_PROVIDER_SITE_OTHER): Payer: Medicare Other | Admitting: Vascular Surgery

## 2012-10-07 ENCOUNTER — Encounter (HOSPITAL_COMMUNITY): Payer: Self-pay

## 2012-10-07 ENCOUNTER — Encounter (HOSPITAL_COMMUNITY)
Admission: RE | Admit: 2012-10-07 | Discharge: 2012-10-07 | Disposition: A | Discharge status: Home / Self Care | Payer: Medicare Other | Source: Ambulatory Visit | Attending: Vascular Surgery | Admitting: Vascular Surgery

## 2012-10-07 VITALS — BP 146/58 | HR 55 | Resp 16 | Ht <= 58 in | Wt 139.9 lb

## 2012-10-07 DIAGNOSIS — Z48812 Encounter for surgical aftercare following surgery on the circulatory system: Secondary | ICD-10-CM

## 2012-10-07 DIAGNOSIS — I739 Peripheral vascular disease, unspecified: Secondary | ICD-10-CM

## 2012-10-07 HISTORY — DX: Pneumonia, unspecified organism: J18.9

## 2012-10-07 HISTORY — DX: Dorsalgia, unspecified: M54.9

## 2012-10-07 HISTORY — DX: Gastro-esophageal reflux disease without esophagitis: K21.9

## 2012-10-07 HISTORY — DX: Unspecified asthma, uncomplicated: J45.909

## 2012-10-07 HISTORY — DX: Other chronic pain: G89.29

## 2012-10-07 LAB — COMPREHENSIVE METABOLIC PANEL
ALT: 8 U/L (ref 0–35)
AST: 21 U/L (ref 0–37)
Albumin: 3.7 g/dL (ref 3.5–5.2)
Alkaline Phosphatase: 74 U/L (ref 39–117)
CO2: 24 mEq/L (ref 19–32)
Chloride: 94 mEq/L — ABNORMAL LOW (ref 96–112)
Creatinine, Ser: 6.81 mg/dL — ABNORMAL HIGH (ref 0.50–1.10)
GFR calc non Af Amer: 5 mL/min — ABNORMAL LOW (ref >90)
Potassium: 4.2 mEq/L (ref 3.5–5.1)
Sodium: 132 mEq/L — ABNORMAL LOW (ref 135–145)
Total Bilirubin: 0.2 mg/dL — ABNORMAL LOW (ref 0.3–1.2)

## 2012-10-07 LAB — APTT: aPTT: 37 seconds (ref 24–37)

## 2012-10-07 LAB — CBC
MCV: 96.7 fL (ref 78.0–100.0)
Platelets: 210 10*3/uL (ref 150–400)
RBC: 3.95 MIL/uL (ref 3.87–5.11)
RDW: 12.5 % (ref 11.5–15.5)
WBC: 5.8 10*3/uL (ref 4.0–10.5)

## 2012-10-07 LAB — PROTIME-INR: Prothrombin Time: 14.4 seconds (ref 11.6–15.2)

## 2012-10-07 LAB — PREPARE RBC (CROSSMATCH)

## 2012-10-07 LAB — SURGICAL PCR SCREEN
MRSA, PCR: NEGATIVE
Staphylococcus aureus: NEGATIVE

## 2012-10-07 NOTE — Progress Notes (Signed)
Forwarded chart to anesthesia to review cardiac notes, stress test and echo.

## 2012-10-07 NOTE — Progress Notes (Signed)
Ankle and toe brachial indices performed @ [facility] 10/07/2012

## 2012-10-07 NOTE — Pre-Procedure Instructions (Signed)
[facility]  10/07/2012   Your procedure is scheduled on:  Thursday October 09, 2012  Report to [facility] at 5:30 AM.  Call this number if you have problems the morning of surgery: [phone]   Remember:   Do not eat food or drink :After Midnight.      Take these medicines the morning of surgery with A SIP OF WATER: amlodipine, cipro, clonidine, metoprolol, flagyl, protonix   Do not wear jewelry, make-up or nail polish.  Do not wear lotions, powders, or perfumes. You may wear deodorant.  Do not shave 48 hours prior to surgery. Men may shave face and neck.  Do not bring valuables to the hospital.  Contacts, dentures or bridgework may not be worn into surgery.  Leave suitcase in the car. After surgery it may be brought to your room.  For patients admitted to the hospital, checkout time is 11:00 AM the day of discharge.   Patients discharged the day of surgery will not be allowed to drive home.  Name and phone number of your driver: family / friend  Special Instructions: Shower using CHG 2 nights before surgery and the night before surgery.  If you shower the day of surgery use CHG.  Use special wash - you have one bottle of CHG for all showers.  You should use approximately 1/3 of the bottle for each shower.   Please read over the following fact sheets that you were given: Pain Booklet, Coughing and Deep Breathing, Blood Transfusion Information, MRSA Information and Surgical Site Infection Prevention

## 2012-10-07 NOTE — Progress Notes (Signed)
Subjective:     Patient ID: [name], female   DOB: 02-29-1934, 78 y.o.   MRN: [id number]  HPI this 78 year old female had a left femoral-popliteal bypass graft performed by me in April of 2012. She has been having pain in the left foot for the past few weeks. She is not exactly clear when the symptoms started. She's also had a previous amputation of the left fifth toe. Her bypass was done with Gore-Tex. She has known common femoral occlusive disease. She had one vessel runoff through the anterior tibial artery at the time of her last bypass.  Past Medical History  Diagnosis Date  . Unspecified essential hypertension   . Hyperpotassemia   . Other specified cardiac dysrhythmias   . Difficulty in walking   . Gout, unspecified   . Type II or unspecified type diabetes mellitus without mention of complication, not stated as uncontrolled   . Mild mitral valve stenosis   . Arthritis   . Joint pain   . Leg pain   . Thyroid disease   . Gangrene     left fifth toe  . Peripheral vascular disease   . Anemia   . Hyperlipidemia   . Aortic valve sclerosis   . Shoulder fracture, right   . End stage renal disease     dialysis 02/25/12  . Blood transfusion     History  Substance Use Topics  . Smoking status: Former Smoker    Types: Cigarettes    Quit date: 12/31/1988  . Smokeless tobacco: Never Used  . Alcohol Use: No     hx of abuse stopped 1990    Family History  Problem Relation Age of Onset  . Peripheral vascular disease Mother     amputation  . Hypertension Mother   . Diabetes Mother     Allergies  Allergen Reactions  . Ace Inhibitors Other (See Comments)    Reaction unknown    Current outpatient prescriptions:amLODipine (NORVASC) 10 MG tablet, Take 10 mg by mouth daily., Disp: , Rfl: ;  aspirin EC 81 MG tablet, Take 81 mg by mouth daily., Disp: , Rfl: ;  calcium acetate (PHOSLO) 667 MG capsule, Take 667 mg by mouth daily. 1 tablet with every meal, 2 tabs with snacks,  Disp: , Rfl: ;  ciprofloxacin (CIPRO) 500 MG tablet, Take 500 mg by mouth 2 (two) times daily., Disp: , Rfl:  cloNIDine (CATAPRES) 0.2 MG tablet, Take 0.2 mg by mouth 2 (two) times daily., Disp: , Rfl: ;  hyoscyamine (LEVSIN, ANASPAZ) 0.125 MG tablet, Take 0.125 mg by mouth every 4 (four) hours as needed. For stomach pain, Disp: , Rfl: ;  metoprolol succinate (TOPROL-XL) 50 MG 24 hr tablet, Take 50 mg by mouth daily. Take with or immediately following a meal., Disp: , Rfl:  metroNIDAZOLE (FLAGYL) 500 MG tablet, Take 500 mg by mouth 2 (two) times daily., Disp: , Rfl: ;  pantoprazole (PROTONIX) 40 MG tablet, Take 40 mg by mouth daily., Disp: , Rfl:   BP 146/58  Pulse 55  Resp 16  Ht 4\' 10"  (1.473 m)  Wt 139 lb 14.4 oz (63.458 kg)  BMI 29.24 kg/m2  SpO2 100%  Body mass index is 29.24 kg/(m^2).          Review of Systems denies chest pain but does have dyspnea on exertion. Chronic hemodialysis Monday Wednesday and Friday. He now lives through right femoral vein cuffed dialysis catheter    Objective:   Physical Exam blood pressure  146/58 heart rate 85 respirations 16 Gen.-alert and oriented x3 in no apparent distress HEENT normal for age Lungs no rhonchi or wheezing Cardiovascular regular rhythm no murmurs carotid pulses 3+ palpable no bruits audible Abdomen soft nontender no palpable masses Musculoskeletal free of  major deformities Skin clear -no rashes Neurologic normal Lower extremities 1-2+ left femoral pulse palpable. No popliteal or distal pulses palpable. Motion and sensation intact left foot. Right leg with dialysis catheter in right femoral vein and 3+ femoral pulse palpable  Today I ordered a duplex scan of left leg bypass graft which is occluded with ABI equal to 0 left foot     Assessment:     #1 occluded left femoral-popliteal bypass graft several weeks ago with ischemia left foot #2 end-stage renal disease has hemodialysis Monday Wednesday and Friday    Plan:      Plan redo left femoral-popliteal bypass graft with Gore-Tex on Thursday, October 10-risks and benefits thoroughly discussed with patient and family and possibility of early occlusion of graft leading to left leg amputation. They would like to proceed

## 2012-10-07 NOTE — Progress Notes (Signed)
Left lower extremity arterial duplex performed @ [facility] 10/07/2012

## 2012-10-08 MED ORDER — SODIUM CHLORIDE 0.9 % IV SOLN: INTRAVENOUS | Status: DC

## 2012-10-08 MED ORDER — DEXTROSE 5 % IV SOLN
1.5000 g | INTRAVENOUS | Status: AC
  Administered 2012-10-09: 1.5 g via INTRAVENOUS
  Filled 2012-10-08: qty 1.5

## 2012-10-08 NOTE — Consult Note (Signed)
Anesthesia chart review: Patient is a 78 year-old female scheduled for a redo left femoral-popliteal bypass with Gore-Tex by Dr. Kellie Simmering on 10/09/2012.  She is s/p left FPBG on 04/11/12.  History includes PAD, former smoker, diabetes mellitus type 2, GERD, end-stage renal disease (HD MWF), asthma, bronchitis, HLD, anemia, severe TR by 03/2012 echo, palpitations.  Cardiologist is Dr. Julianne Handler.    EKG on 04/11/12 showed sinus bradycardia with sinus arrhythmia, T wave abnormality, consider lateral ischemia (unchanged).  Anterior T wave abnormality has resolved since 06/18/11.   Nuclear stress test on 05/06/12 showed: Normal stress nuclear study.  LV Ejection Fraction: 73%. LV Wall Motion: Normal Wall Motion.   Echo on 04/24/12 showed: - Left ventricle: The cavity size was normal. Wall thickness was normal. Systolic function was normal. The estimated ejection fraction was in the range of 50% to 55%. - Mitral valve: No stenosis. No regurgitation. - Aortic valve: Mildly thickened, mildly calcified leaflets.  No stenosis.  No significant regurgitation. - Tricuspid valve: Severe regurgitation. - Pulmonary arteries: Systolic pressure was mildly increased. PA peak pressure: 68mm Hg (S). (By notes, Dr. Julianne Handler felt her echo was "unchanged" from prior.  Chest x-ray on 04/11/2012 showed: 1. No acute cardiopulmonary abnormality.  2. Inferior approach dual lumen dialysis type catheter in place.   Labs noted.  ISTAT on arrival.  Myra Gianotti, [place]

## 2012-10-09 ENCOUNTER — Encounter (HOSPITAL_COMMUNITY)
Admission: RE | Disposition: A | Discharge status: Home / Self Care | Payer: Self-pay | Source: Ambulatory Visit | Attending: Vascular Surgery

## 2012-10-09 ENCOUNTER — Inpatient Hospital Stay (HOSPITAL_COMMUNITY)
Admission: RE | Admit: 2012-10-09 | Discharge: 2012-10-13 | DRG: 252 | Disposition: A | Discharge status: Home / Self Care | Payer: Medicare Other | Source: Ambulatory Visit | Attending: Vascular Surgery | Admitting: Vascular Surgery

## 2012-10-09 ENCOUNTER — Encounter: Payer: Self-pay | Admitting: Vascular Surgery

## 2012-10-09 ENCOUNTER — Encounter (HOSPITAL_COMMUNITY): Payer: Self-pay | Admitting: Vascular Surgery

## 2012-10-09 ENCOUNTER — Ambulatory Visit (HOSPITAL_COMMUNITY): Payer: Medicare Other | Admitting: Vascular Surgery

## 2012-10-09 ENCOUNTER — Ambulatory Visit (HOSPITAL_COMMUNITY): Payer: Medicare Other

## 2012-10-09 DIAGNOSIS — J449 Chronic obstructive pulmonary disease, unspecified: Secondary | ICD-10-CM | POA: Diagnosis present

## 2012-10-09 DIAGNOSIS — J4489 Other specified chronic obstructive pulmonary disease: Secondary | ICD-10-CM | POA: Diagnosis present

## 2012-10-09 DIAGNOSIS — K219 Gastro-esophageal reflux disease without esophagitis: Secondary | ICD-10-CM | POA: Diagnosis present

## 2012-10-09 DIAGNOSIS — E119 Type 2 diabetes mellitus without complications: Secondary | ICD-10-CM | POA: Diagnosis present

## 2012-10-09 DIAGNOSIS — T82898A Other specified complication of vascular prosthetic devices, implants and grafts, initial encounter: Secondary | ICD-10-CM

## 2012-10-09 DIAGNOSIS — I7092 Chronic total occlusion of artery of the extremities: Secondary | ICD-10-CM | POA: Diagnosis present

## 2012-10-09 DIAGNOSIS — I70219 Atherosclerosis of native arteries of extremities with intermittent claudication, unspecified extremity: Secondary | ICD-10-CM | POA: Diagnosis present

## 2012-10-09 DIAGNOSIS — Z87891 Personal history of nicotine dependence: Secondary | ICD-10-CM

## 2012-10-09 DIAGNOSIS — I120 Hypertensive chronic kidney disease with stage 5 chronic kidney disease or end stage renal disease: Secondary | ICD-10-CM | POA: Diagnosis present

## 2012-10-09 DIAGNOSIS — I739 Peripheral vascular disease, unspecified: Secondary | ICD-10-CM

## 2012-10-09 DIAGNOSIS — E785 Hyperlipidemia, unspecified: Secondary | ICD-10-CM | POA: Diagnosis present

## 2012-10-09 DIAGNOSIS — Z7982 Long term (current) use of aspirin: Secondary | ICD-10-CM

## 2012-10-09 DIAGNOSIS — N186 End stage renal disease: Secondary | ICD-10-CM | POA: Diagnosis present

## 2012-10-09 DIAGNOSIS — Z8249 Family history of ischemic heart disease and other diseases of the circulatory system: Secondary | ICD-10-CM

## 2012-10-09 DIAGNOSIS — I743 Embolism and thrombosis of arteries of the lower extremities: Secondary | ICD-10-CM

## 2012-10-09 DIAGNOSIS — Z992 Dependence on renal dialysis: Secondary | ICD-10-CM

## 2012-10-09 DIAGNOSIS — Z79899 Other long term (current) drug therapy: Secondary | ICD-10-CM

## 2012-10-09 DIAGNOSIS — M109 Gout, unspecified: Secondary | ICD-10-CM | POA: Diagnosis present

## 2012-10-09 DIAGNOSIS — I70409 Unspecified atherosclerosis of autologous vein bypass graft(s) of the extremities, unspecified extremity: Principal | ICD-10-CM | POA: Diagnosis present

## 2012-10-09 DIAGNOSIS — Z833 Family history of diabetes mellitus: Secondary | ICD-10-CM

## 2012-10-09 HISTORY — PX: FEMORAL-POPLITEAL BYPASS GRAFT: SHX937

## 2012-10-09 LAB — POCT I-STAT 4, (NA,K, GLUC, HGB,HCT)
Glucose, Bld: 143 mg/dL — ABNORMAL HIGH (ref 70–99)
HCT: 38 % (ref 36.0–46.0)
Hemoglobin: 12.9 g/dL (ref 12.0–15.0)
Potassium: 4 mEq/L (ref 3.5–5.1)
Sodium: 135 mEq/L (ref 135–145)

## 2012-10-09 SURGERY — BYPASS GRAFT FEMORAL-POPLITEAL ARTERY
Anesthesia: General | Site: Leg Upper | Laterality: Left

## 2012-10-09 MED ORDER — EPHEDRINE SULFATE 50 MG/ML IJ SOLN
INTRAMUSCULAR | Status: DC | PRN
  Administered 2012-10-09: 10 mg via INTRAVENOUS
  Administered 2012-10-09: 15 mg via INTRAVENOUS

## 2012-10-09 MED ORDER — ACETAMINOPHEN 325 MG PO TABS: 325.0000 mg | ORAL_TABLET | ORAL | Status: DC | PRN

## 2012-10-09 MED ORDER — BISACODYL 10 MG RE SUPP: 10.0000 mg | Freq: Every day | RECTAL | Status: DC | PRN

## 2012-10-09 MED ORDER — DOCUSATE SODIUM 100 MG PO CAPS
100.0000 mg | ORAL_CAPSULE | Freq: Every day | ORAL | Status: DC
  Administered 2012-10-11 – 2012-10-13 (×3): 100 mg via ORAL
  Filled 2012-10-09 (×3): qty 1

## 2012-10-09 MED ORDER — FENTANYL CITRATE 0.05 MG/ML IJ SOLN
INTRAMUSCULAR | Status: DC | PRN
  Administered 2012-10-09 (×3): 50 ug via INTRAVENOUS
  Administered 2012-10-09: 100 ug via INTRAVENOUS

## 2012-10-09 MED ORDER — HYDROMORPHONE HCL PF 1 MG/ML IJ SOLN
0.2500 mg | INTRAMUSCULAR | Status: DC | PRN
  Administered 2012-10-09 (×2): 0.5 mg via INTRAVENOUS

## 2012-10-09 MED ORDER — HYDRALAZINE HCL 20 MG/ML IJ SOLN
10.0000 mg | INTRAMUSCULAR | Status: DC | PRN
  Filled 2012-10-09: qty 0.5

## 2012-10-09 MED ORDER — 0.9 % SODIUM CHLORIDE (POUR BTL) OPTIME
TOPICAL | Status: DC | PRN
  Administered 2012-10-09: 1000 mL

## 2012-10-09 MED ORDER — GUAIFENESIN-DM 100-10 MG/5ML PO SYRP: 15.0000 mL | ORAL_SOLUTION | ORAL | Status: DC | PRN

## 2012-10-09 MED ORDER — INSULIN ASPART 100 UNIT/ML ~~LOC~~ SOLN
0.0000 [IU] | Freq: Three times a day (TID) | SUBCUTANEOUS | Status: DC
  Administered 2012-10-09 – 2012-10-10 (×2): 1 [IU] via SUBCUTANEOUS
  Administered 2012-10-11: 3 [IU] via SUBCUTANEOUS
  Administered 2012-10-12: 1 [IU] via SUBCUTANEOUS
  Administered 2012-10-13: 2 [IU] via SUBCUTANEOUS

## 2012-10-09 MED ORDER — FLEET ENEMA 7-19 GM/118ML RE ENEM
1.0000 | ENEMA | Freq: Once | RECTAL | Status: DC | PRN
  Filled 2012-10-09: qty 1

## 2012-10-09 MED ORDER — CALCIUM ACETATE 667 MG PO CAPS
667.0000 mg | ORAL_CAPSULE | Freq: Three times a day (TID) | ORAL | Status: DC
  Administered 2012-10-09 – 2012-10-11 (×4): 667 mg via ORAL
  Filled 2012-10-09 (×11): qty 1

## 2012-10-09 MED ORDER — MEPERIDINE HCL 25 MG/ML IJ SOLN: 6.2500 mg | INTRAMUSCULAR | Status: DC | PRN

## 2012-10-09 MED ORDER — DOPAMINE-DEXTROSE 3.2-5 MG/ML-% IV SOLN: 3.0000 ug/kg/min | INTRAVENOUS | Status: DC

## 2012-10-09 MED ORDER — CLONIDINE HCL 0.2 MG PO TABS
0.2000 mg | ORAL_TABLET | Freq: Two times a day (BID) | ORAL | Status: DC
  Administered 2012-10-09 – 2012-10-13 (×7): 0.2 mg via ORAL
  Filled 2012-10-09 (×10): qty 1

## 2012-10-09 MED ORDER — IOHEXOL 300 MG/ML  SOLN
INTRAMUSCULAR | Status: DC | PRN
  Administered 2012-10-09: 30 mL via INTRAVENOUS

## 2012-10-09 MED ORDER — PHENOL 1.4 % MT LIQD: 1.0000 | OROMUCOSAL | Status: DC | PRN

## 2012-10-09 MED ORDER — DEXTROSE 5 % IV SOLN
1.5000 g | Freq: Two times a day (BID) | INTRAVENOUS | Status: AC
  Administered 2012-10-09 – 2012-10-10 (×2): 1.5 g via INTRAVENOUS
  Filled 2012-10-09 (×2): qty 1.5

## 2012-10-09 MED ORDER — CIPROFLOXACIN HCL 500 MG PO TABS
500.0000 mg | ORAL_TABLET | ORAL | Status: DC
  Administered 2012-10-11 – 2012-10-13 (×2): 500 mg via ORAL
  Filled 2012-10-09 (×5): qty 1

## 2012-10-09 MED ORDER — ASPIRIN EC 81 MG PO TBEC
81.0000 mg | DELAYED_RELEASE_TABLET | Freq: Every day | ORAL | Status: DC
  Administered 2012-10-11 – 2012-10-13 (×3): 81 mg via ORAL
  Filled 2012-10-09 (×5): qty 1

## 2012-10-09 MED ORDER — MORPHINE SULFATE 2 MG/ML IJ SOLN
2.0000 mg | INTRAMUSCULAR | Status: DC | PRN
Start: 2012-10-09 — End: 2012-10-13
  Administered 2012-10-09 – 2012-10-10 (×6): 2 mg via INTRAVENOUS
  Filled 2012-10-09 (×6): qty 1

## 2012-10-09 MED ORDER — ROCURONIUM BROMIDE 100 MG/10ML IV SOLN
INTRAVENOUS | Status: DC | PRN
  Administered 2012-10-09: 15 mg via INTRAVENOUS
  Administered 2012-10-09: 25 mg via INTRAVENOUS

## 2012-10-09 MED ORDER — MAGNESIUM SULFATE 40 MG/ML IJ SOLN: 2.0000 g | Freq: Once | INTRAMUSCULAR | Status: AC | PRN

## 2012-10-09 MED ORDER — LABETALOL HCL 5 MG/ML IV SOLN
10.0000 mg | INTRAVENOUS | Status: DC | PRN
  Filled 2012-10-09: qty 4

## 2012-10-09 MED ORDER — SODIUM CHLORIDE 0.9 % IR SOLN
Status: DC | PRN
  Administered 2012-10-09: 09:00:00

## 2012-10-09 MED ORDER — HYDROMORPHONE HCL PF 1 MG/ML IJ SOLN
INTRAMUSCULAR | Status: AC
  Filled 2012-10-09: qty 1

## 2012-10-09 MED ORDER — NEOSTIGMINE METHYLSULFATE 1 MG/ML IJ SOLN
INTRAMUSCULAR | Status: DC | PRN
  Administered 2012-10-09: 3 mg via INTRAVENOUS

## 2012-10-09 MED ORDER — GLYCOPYRROLATE 0.2 MG/ML IJ SOLN
INTRAMUSCULAR | Status: DC | PRN
  Administered 2012-10-09: 0.4 mg via INTRAVENOUS

## 2012-10-09 MED ORDER — METOPROLOL SUCCINATE ER 50 MG PO TB24
50.0000 mg | ORAL_TABLET | Freq: Every day | ORAL | Status: DC
  Filled 2012-10-09 (×2): qty 1

## 2012-10-09 MED ORDER — PROPOFOL 10 MG/ML IV BOLUS
INTRAVENOUS | Status: DC | PRN
  Administered 2012-10-09: 50 mg via INTRAVENOUS
  Administered 2012-10-09: 200 mg via INTRAVENOUS

## 2012-10-09 MED ORDER — SUCCINYLCHOLINE CHLORIDE 20 MG/ML IJ SOLN
INTRAMUSCULAR | Status: DC | PRN
  Administered 2012-10-09: 100 mg via INTRAVENOUS

## 2012-10-09 MED ORDER — POTASSIUM CHLORIDE CRYS ER 20 MEQ PO TBCR: 20.0000 meq | EXTENDED_RELEASE_TABLET | Freq: Once | ORAL | Status: DC | PRN

## 2012-10-09 MED ORDER — LIDOCAINE HCL (CARDIAC) 20 MG/ML IV SOLN
INTRAVENOUS | Status: DC | PRN
  Administered 2012-10-09: 80 mg via INTRAVENOUS

## 2012-10-09 MED ORDER — ONDANSETRON HCL 4 MG/2ML IJ SOLN
INTRAMUSCULAR | Status: AC
  Filled 2012-10-09: qty 2

## 2012-10-09 MED ORDER — HEMOSTATIC AGENTS (NO CHARGE) OPTIME
TOPICAL | Status: DC | PRN
  Administered 2012-10-09: 1 via TOPICAL

## 2012-10-09 MED ORDER — ARTIFICIAL TEARS OP OINT
TOPICAL_OINTMENT | OPHTHALMIC | Status: DC | PRN
  Administered 2012-10-09: 1 via OPHTHALMIC

## 2012-10-09 MED ORDER — SORBITOL 70 % PO SOLN
30.0000 mL | Freq: Every day | ORAL | Status: DC | PRN
  Filled 2012-10-09 (×2): qty 30

## 2012-10-09 MED ORDER — PANTOPRAZOLE SODIUM 40 MG PO TBEC
40.0000 mg | DELAYED_RELEASE_TABLET | Freq: Every day | ORAL | Status: DC
  Administered 2012-10-09 – 2012-10-13 (×5): 40 mg via ORAL
  Filled 2012-10-09 (×5): qty 1

## 2012-10-09 MED ORDER — HEPARIN SODIUM (PORCINE) 1000 UNIT/ML IJ SOLN
INTRAMUSCULAR | Status: DC | PRN
  Administered 2012-10-09: 6000 [IU] via INTRAVENOUS

## 2012-10-09 MED ORDER — PROTAMINE SULFATE 10 MG/ML IV SOLN
INTRAVENOUS | Status: DC | PRN
  Administered 2012-10-09: 20 mg via INTRAVENOUS
  Administered 2012-10-09: 10 mg via INTRAVENOUS
  Administered 2012-10-09: 20 mg via INTRAVENOUS

## 2012-10-09 MED ORDER — ONDANSETRON HCL 4 MG/2ML IJ SOLN
4.0000 mg | Freq: Four times a day (QID) | INTRAMUSCULAR | Status: DC | PRN
  Administered 2012-10-09 – 2012-10-10 (×3): 4 mg via INTRAVENOUS
  Filled 2012-10-09 (×2): qty 2

## 2012-10-09 MED ORDER — ONDANSETRON HCL 4 MG/2ML IJ SOLN
INTRAMUSCULAR | Status: DC | PRN
  Administered 2012-10-09: 4 mg via INTRAVENOUS

## 2012-10-09 MED ORDER — OXYCODONE HCL 5 MG/5ML PO SOLN: 5.0000 mg | Freq: Once | ORAL | Status: DC | PRN

## 2012-10-09 MED ORDER — CIPROFLOXACIN HCL 500 MG PO TABS
500.0000 mg | ORAL_TABLET | Freq: Two times a day (BID) | ORAL | Status: DC
  Filled 2012-10-09 (×2): qty 1

## 2012-10-09 MED ORDER — ACETAMINOPHEN 650 MG RE SUPP: 325.0000 mg | RECTAL | Status: DC | PRN

## 2012-10-09 MED ORDER — SODIUM CHLORIDE 0.9 % IV SOLN: 500.0000 mL | Freq: Once | INTRAVENOUS | Status: AC | PRN

## 2012-10-09 MED ORDER — METOPROLOL SUCCINATE ER 50 MG PO TB24
50.0000 mg | ORAL_TABLET | Freq: Every day | ORAL | Status: DC
  Administered 2012-10-09 – 2012-10-13 (×4): 50 mg via ORAL
  Filled 2012-10-09 (×5): qty 1

## 2012-10-09 MED ORDER — SODIUM CHLORIDE 0.9 % IV SOLN
INTRAVENOUS | Status: DC | PRN
  Administered 2012-10-09 (×2): via INTRAVENOUS

## 2012-10-09 MED ORDER — MIDAZOLAM HCL 5 MG/5ML IJ SOLN
INTRAMUSCULAR | Status: DC | PRN
  Administered 2012-10-09: 1 mg via INTRAVENOUS

## 2012-10-09 MED ORDER — HYOSCYAMINE SULFATE 0.125 MG PO TABS
0.1250 mg | ORAL_TABLET | ORAL | Status: DC | PRN
  Filled 2012-10-09: qty 1

## 2012-10-09 MED ORDER — OXYCODONE HCL 5 MG PO TABS
5.0000 mg | ORAL_TABLET | ORAL | Status: DC | PRN
  Administered 2012-10-11 – 2012-10-13 (×3): 10 mg via ORAL
  Administered 2012-10-13: 5 mg via ORAL
  Filled 2012-10-09 (×3): qty 2

## 2012-10-09 MED ORDER — PARICALCITOL 5 MCG/ML IV SOLN
1.0000 ug | INTRAVENOUS | Status: DC
  Administered 2012-10-10 – 2012-10-13 (×2): 1 ug via INTRAVENOUS
  Filled 2012-10-09 (×2): qty 0.2

## 2012-10-09 MED ORDER — SENNOSIDES-DOCUSATE SODIUM 8.6-50 MG PO TABS
1.0000 | ORAL_TABLET | Freq: Every evening | ORAL | Status: DC | PRN
  Filled 2012-10-09: qty 1

## 2012-10-09 MED ORDER — METOPROLOL TARTRATE 1 MG/ML IV SOLN: 2.0000 mg | INTRAVENOUS | Status: DC | PRN

## 2012-10-09 MED ORDER — OXYCODONE HCL 5 MG PO TABS: 5.0000 mg | ORAL_TABLET | Freq: Once | ORAL | Status: DC | PRN

## 2012-10-09 MED ORDER — PROMETHAZINE HCL 25 MG/ML IJ SOLN: 6.2500 mg | INTRAMUSCULAR | Status: DC | PRN

## 2012-10-09 MED ORDER — SODIUM CHLORIDE 0.9 % IV SOLN
INTRAVENOUS | Status: DC
  Administered 2012-10-09 (×2): via INTRAVENOUS

## 2012-10-09 SURGICAL SUPPLY — 65 items
ADH SKN CLS APL DERMABOND .7 (GAUZE/BANDAGES/DRESSINGS) ×4
BANDAGE ESMARK 6X9 LF (GAUZE/BANDAGES/DRESSINGS) IMPLANT
BNDG CMPR 9X6 STRL LF SNTH (GAUZE/BANDAGES/DRESSINGS)
BNDG ESMARK 6X9 LF (GAUZE/BANDAGES/DRESSINGS)
BOOT SUTURE AID YELLOW STND (SUTURE) ×1 IMPLANT
CANISTER SUCTION 2500CC (MISCELLANEOUS) ×3 IMPLANT
CATH EMB 3FR 80CM (CATHETERS) ×1 IMPLANT
CATH EMB 4FR 80CM (CATHETERS) ×1 IMPLANT
CLIP TI MEDIUM 24 (CLIP) ×3 IMPLANT
CLIP TI WIDE RED SMALL 24 (CLIP) ×3 IMPLANT
CLOTH BEACON ORANGE TIMEOUT ST (SAFETY) ×3 IMPLANT
COVER SURGICAL LIGHT HANDLE (MISCELLANEOUS) ×3 IMPLANT
DECANTER SPIKE VIAL GLASS SM (MISCELLANEOUS) IMPLANT
DERMABOND ADVANCED (GAUZE/BANDAGES/DRESSINGS) ×2
DERMABOND ADVANCED .7 DNX12 (GAUZE/BANDAGES/DRESSINGS) ×2 IMPLANT
DRAIN SNY 10X20 3/4 PERF (WOUND CARE) IMPLANT
DRAPE WARM FLUID 44X44 (DRAPE) ×3 IMPLANT
DRAPE X-RAY CASS 24X20 (DRAPES) ×1 IMPLANT
ELECT REM PT RETURN 9FT ADLT (ELECTROSURGICAL) ×3
ELECTRODE REM PT RTRN 9FT ADLT (ELECTROSURGICAL) ×2 IMPLANT
EVACUATOR SILICONE 100CC (DRAIN) IMPLANT
GLOVE BIOGEL PI IND STRL 6.5 (GLOVE) IMPLANT
GLOVE BIOGEL PI IND STRL 7.5 (GLOVE) IMPLANT
GLOVE BIOGEL PI INDICATOR 6.5 (GLOVE) ×3
GLOVE BIOGEL PI INDICATOR 7.5 (GLOVE) ×2
GLOVE ECLIPSE 6.5 STRL STRAW (GLOVE) ×6 IMPLANT
GLOVE ORTHOPEDIC STR SZ6.5 (GLOVE) ×1 IMPLANT
GLOVE SS BIOGEL STRL SZ 7 (GLOVE) ×2 IMPLANT
GLOVE SUPERSENSE BIOGEL SZ 7 (GLOVE) ×2
GOWN PREVENTION PLUS XXLARGE (GOWN DISPOSABLE) ×2 IMPLANT
GOWN STRL NON-REIN LRG LVL3 (GOWN DISPOSABLE) ×5 IMPLANT
GRAFT PROPATEN THIN WALL 6X80 (Vascular Products) ×1 IMPLANT
INSERT FOGARTY SM (MISCELLANEOUS) ×3 IMPLANT
KIT BASIN OR (CUSTOM PROCEDURE TRAY) ×3 IMPLANT
KIT ROOM TURNOVER OR (KITS) ×3 IMPLANT
NS IRRIG 1000ML POUR BTL (IV SOLUTION) ×6 IMPLANT
PACK PERIPHERAL VASCULAR (CUSTOM PROCEDURE TRAY) ×3 IMPLANT
PAD ARMBOARD 7.5X6 YLW CONV (MISCELLANEOUS) ×6 IMPLANT
PADDING CAST COTTON 6X4 STRL (CAST SUPPLIES) IMPLANT
SET COLLECT BLD 21X3/4 12 (NEEDLE) ×1 IMPLANT
SPONGE LAP 18X18 X RAY DECT (DISPOSABLE) ×1 IMPLANT
STOPCOCK 4 WAY LG BORE MALE ST (IV SETS) ×1 IMPLANT
SUT PROLENE 6 0 BV (SUTURE) ×1 IMPLANT
SUT PROLENE 6 0 C 1 24 (SUTURE) ×5 IMPLANT
SUT PROLENE 6 0 CC (SUTURE) ×9 IMPLANT
SUT PROLENE 6 0 CC 1 (SUTURE) ×1 IMPLANT
SUT PROLENE 7 0 BV 1 (SUTURE) IMPLANT
SUT PROLENE 7 0 BV1 MDA (SUTURE) IMPLANT
SUT SILK  1 MH (SUTURE) ×1
SUT SILK 1 MH (SUTURE) IMPLANT
SUT SILK 2 0 SH (SUTURE) ×3 IMPLANT
SUT SILK 3 0 (SUTURE)
SUT SILK 3-0 18XBRD TIE 12 (SUTURE) IMPLANT
SUT VIC AB 2-0 CTX 36 (SUTURE) ×6 IMPLANT
SUT VIC AB 3-0 SH 27 (SUTURE) ×6
SUT VIC AB 3-0 SH 27X BRD (SUTURE) ×4 IMPLANT
SUT VICRYL 4-0 PS2 18IN ABS (SUTURE) ×1 IMPLANT
SYR TB 1ML LUER SLIP (SYRINGE) ×2 IMPLANT
TOWEL OR 17X24 6PK STRL BLUE (TOWEL DISPOSABLE) ×6 IMPLANT
TOWEL OR 17X26 10 PK STRL BLUE (TOWEL DISPOSABLE) ×6 IMPLANT
TRAY FOLEY CATH 14FRSI W/METER (CATHETERS) ×3 IMPLANT
TUBING EXTENTION W/L.L. (IV SETS) ×1 IMPLANT
UNDERPAD 30X30 INCONTINENT (UNDERPADS AND DIAPERS) ×3 IMPLANT
VITASURE W/FLOW CONTROL (MISCELLANEOUS) ×1 IMPLANT
WATER STERILE IRR 1000ML POUR (IV SOLUTION) ×3 IMPLANT

## 2012-10-09 NOTE — Anesthesia Preprocedure Evaluation (Addendum)
Anesthesia Evaluation  Patient identified by MRN, date of birth, ID band Patient awake    Reviewed: H&P , NPO status , Patient's Chart, lab work & pertinent test results, reviewed documented beta blocker date and time   Airway Mallampati: II  Neck ROM: Full    Dental  (+) Poor Dentition and Loose   Pulmonary COPD breath sounds clear to auscultation        Cardiovascular hypertension, + dysrhythmias + Valvular Problems/Murmurs Rhythm:Regular Rate:Normal     Neuro/Psych    GI/Hepatic GERD-  ,  Endo/Other  diabetes  Renal/GU ESRFRenal disease     Musculoskeletal negative musculoskeletal ROS (+)   Abdominal   Peds  Hematology negative hematology ROS (+)   Anesthesia Other Findings   Reproductive/Obstetrics                          Anesthesia Physical Anesthesia Plan  ASA: III  Anesthesia Plan: General   Post-op Pain Management:    Induction: Intravenous  Airway Management Planned: Oral ETT  Additional Equipment:   Intra-op Plan:   Post-operative Plan: Extubation in OR  Informed Consent: I have reviewed the patients History and Physical, chart, labs and discussed the procedure including the risks, benefits and alternatives for the proposed anesthesia with the patient or authorized representative who has indicated his/her understanding and acceptance.   Dental advisory given  Plan Discussed with: CRNA and Surgeon  Anesthesia Plan Comments:         Anesthesia Quick Evaluation

## 2012-10-09 NOTE — OR Nursing (Signed)
Patient stated sips of water were taken with Lopressor this morning. Patient was unable to tell me time for sips of water. Last time stated for food and glass of drink was before midnight 10/08/2012. I communicated this with Mateo Flow, CRNA.

## 2012-10-09 NOTE — Progress Notes (Signed)
Pt unsure of when she took her Metoprolol XL.  Spoke with Dr Linna Caprice and he said to give med anyway.  Pt's pulse 53 so it was held per protocol.  Anesthetist informed.

## 2012-10-09 NOTE — Preoperative (Signed)
Beta Blockers   Reason not to administer Beta Blockers:Hold  beta blocker due to Bradycardia (HR less than 50 bpm) 

## 2012-10-09 NOTE — Transfer of Care (Signed)
Immediate Anesthesia Transfer of Care Note  Patient: [name]  Procedure(s) Performed: Procedure(s) (LRB) with comments: BYPASS GRAFT FEMORAL-POPLITEAL ARTERY (Left) - Redo left tibioperoneal trunk bypass with Gortex Graft 38mmx80cm. ENDARTERECTOMY TIBIOPERONEAL (Left) - Endarterectomy of tibioperoneal trunk and anterior tibial artery.  Patient Location: PACU  Anesthesia Type: General  Level of Consciousness: awake, alert , oriented and patient cooperative  Airway & Oxygen Therapy: Patient Spontanous Breathing and Patient connected to nasal cannula oxygen  Post-op Assessment: Report given to PACU RN, Post -op Vital signs reviewed and stable and Patient moving all extremities  Post vital signs: Reviewed and stable  Complications: No apparent anesthesia complications

## 2012-10-09 NOTE — Anesthesia Procedure Notes (Signed)
Procedure Name: Intubation Date/Time: 10/09/2012 7:50 AM Performed by: Julian Reil Pre-anesthesia Checklist: Patient identified, Emergency Drugs available, Suction available and Patient being monitored Patient Re-evaluated:Patient Re-evaluated prior to inductionOxygen Delivery Method: Circle system utilized Preoxygenation: Pre-oxygenation with 100% oxygen Intubation Type: IV induction Ventilation: Mask ventilation without difficulty Laryngoscope Size: 3 and Mac Grade View: Grade III Tube type: Oral Tube size: 7.5 mm Number of attempts: 1 Airway Equipment and Method: Stylet Placement Confirmation: ETT inserted through vocal cords under direct vision,  positive ETCO2 and breath sounds checked- equal and bilateral Secured at: 21 cm Tube secured with: Tape Dental Injury: Injury to lip  Comments: Smooth IV induction.  Easy mask airway by [facility] RN.  DL x 2 by [facility] RN with Sabra Heck 2.  Unable to intubate, no dental damage, small laceration noted on upper lip, O2 sat remained 100%.  DL x 1 by CRNA with MAC 3 blade.  Atraumatic intubation, grade III view.  Lacrilube applied to lips.

## 2012-10-09 NOTE — Consult Note (Signed)
[facility] Renal Consultation Note  Indication for Consultation:  Management of ESRD/hemodialysis; anemia, hypertension/volume and secondary hyperparathyroidism  HPI: MEILI RIGGAN is a 78 y.o. female admitted by Dr. Kellie Simmering post op Occlusion of left fem-pop bypass graft secondary to severe popliteal and tibial occlusive disease with Redo left external iliac to tibioperoneal trunk bypass using 6 mm Gore-Tex-propaten  Endarterectomy of tibioperoneal trunk and origin of anterior tibial artery . She had her Normal outpatient Hemodialysis yesterday. She reports progressive left leg pain and went to [facility] for evaluation .Now postop  Cos of some nausea with leg pain now resolved since surgery.       Past Medical History  Diagnosis Date  . Unspecified essential hypertension   . Hyperpotassemia   . Difficulty in walking   . Gout, unspecified   . Mild mitral valve stenosis   . Arthritis   . Joint pain   . Leg pain   . Thyroid disease   . Gangrene     left fifth toe  . Peripheral vascular disease   . Anemia   . Hyperlipidemia   . Aortic valve sclerosis   . Shoulder fracture, right   . Blood transfusion   . Asthma     hx of  . Pneumonia     hx of  . Bronchitis     hx of  . Type II or unspecified type diabetes mellitus without mention of complication, not stated as uncontrolled     "states not on any medication at this time"  . End stage renal disease     dialysis 02/25/12,  [facility], [facility], [facility]  . GERD (gastroesophageal reflux disease)     hx of  . Chronic back pain   . Fractured shoulder     hx of  . Other specified cardiac dysrhythmias     sees Dr. Angelena Form    Past Surgical History  Procedure Date  . Av fistula repair     left  . Av fistula repair     right arm fistula failed  . Total abdominal hysterectomy     one ovary removed  . Arteriovenous graft placement     left femoral loop arteriovenous Gore-Tex graft.  . Av fistula placement     . Amputation 06/22/11    metatarsal amp  . Pr vein bypass graft,aorto-fem-pop 06/14/2011  . Toe amputation 2012    Left 5th toe  . Femoral-popliteal bypass graft 04/11/2012    Procedure: BYPASS GRAFT FEMORAL-POPLITEAL ARTERY;  Surgeon: [name], MD;  Location: [place];  Service: Vascular;  Laterality: Left;  Thrombectomy/Left femoral-popliteal bypass with revision of proximal end and shortening of graft; intraoperative arteriogram; endarterectomy and patch angioplasty with distal anastomosis  . Appendectomy   . Eye surgery     cataract surgery bilateral      Family History  Problem Relation Age of Onset  . Peripheral vascular disease Mother     amputation  . Hypertension Mother   . Diabetes Mother    [place]= lives at home with husband and granddaughter  And her baby   She   reports that she quit smoking about 23 years ago. Her smoking use included Cigarettes. She has never used smokeless tobacco. She reports that she does not drink alcohol or use illicit drugs.   Allergies  Allergen Reactions  . Ace Inhibitors Other (See Comments)    Reaction unknown    Prior to Admission medications   Medication Sig Start Date  End Date Taking? Authorizing Provider  aspirin EC 81 MG tablet Take 81 mg by mouth daily.   Yes Historical Provider, MD  calcium acetate (PHOSLO) 667 MG capsule Take 667 mg by mouth 3 (three) times daily. 1 tablet with every meal, 2 tabs with snacks 09/25/12  Yes Historical Provider, MD  ciprofloxacin (CIPRO) 500 MG tablet Take 500 mg by mouth every 12 (twelve) hours.    Yes Historical Provider, MD  cloNIDine (CATAPRES) 0.2 MG tablet Take 0.2 mg by mouth 2 (two) times daily.   Yes Historical Provider, MD  hyoscyamine (LEVSIN, ANASPAZ) 0.125 MG tablet Take 0.125 mg by mouth every 4 (four) hours as needed. For abdominal cramping   Yes Historical Provider, MD  metoprolol succinate (TOPROL-XL) 50 MG 24 hr tablet Take 50 mg by mouth daily. Take with or immediately following a  meal. 04/15/12 04/15/13 Yes Samantha J Rhyne, PA  sorbitol 70 % solution Take 30 mLs by mouth daily as needed. For bladder   Yes Historical Provider, MD    [id number] chloride, acetaminophen, acetaminophen, bisacodyl, guaiFENesin-dextromethorphan, hydrALAZINE, hyoscyamine, labetalol, magnesium sulfate 1 - 4 g bolus IVPB, metoprolol, morphine injection, ondansetron, oxyCODONE, phenol, potassium chloride, senna-docusate, sodium phosphate, sorbitol, DISCONTD: 0.9 % irrigation (POUR BTL), DISCONTD: hemostatic agents, DISCONTD: heparin 6000 unit irrigation, DISCONTD:  HYDROmorphone (DILAUDID) injection DISCONTD: iohexol, DISCONTD: meperidine (DEMEROL) injection, DISCONTD: oxyCODONE, DISCONTD: oxyCODONE, DISCONTD: promethazine  Results for orders placed during the hospital encounter of 10/09/12 (from the past 48 hour(s))  POCT I-STAT 4, (NA,K, GLUC, HGB,HCT)     Status: Abnormal   Collection Time   10/09/12  5:51 AM      Component Value Range Comment   Sodium 135  135 - 145 mEq/L    Potassium 4.0  3.5 - 5.1 mEq/L    Glucose, Bld 143 (*) 70 - 99 mg/dL    HCT 38.0  36.0 - 46.0 %    Hemoglobin 12.9  12.0 - 15.0 g/dL   GLUCOSE, CAPILLARY     Status: Abnormal   Collection Time   10/09/12  5:56 AM      Component Value Range Comment   Glucose-Capillary 139 (*) 70 - 99 mg/dL   GLUCOSE, CAPILLARY     Status: Abnormal   Collection Time   10/09/12 11:36 AM      Component Value Range Comment   Glucose-Capillary 138 (*) 70 - 99 mg/dL      ROS: some abdominal discomfort  After lifting my grand baby too much and "my primary care doc put me on pain med and Cipro, denies any urinary symptomsand as in HPI with progressive left leg pain, no fevers, chills, sweats, sob, or gi symptoms  Physical Exam: Filed Vitals:   10/09/12 1500  BP: 135/60  Pulse: 67  Temp:   Resp: 7     General: Alert, BF, NAD Appropriate. Did have emesis of clear fluid while examing HEENT: Ross, MMM Eyes: eomi Neck: supple, no  jvd Heart: RRR, no rub or murmur Lungs: CTA no rales or rhonchi Abdomen: bs=+, soft, non tender, mildly obese Extremities:no pedal edema Skin: no rash, warm to touch , bilateral feet and legs Neuro: OX3,moves all extremities, no acute focal deficits Dialysis Access: right femoral perm cath nontender ,no dc on dressing  Dialysis Orders: Center: [facility]  on MWF . EDW 62kg HD [place] 2.0 k, 2.25 ca  Time 3hrs 89min Heparin 25ml. Access right fem . Perm cath BFR 400 DFR 800   Zemplar 1.0 mcg IV/HD Epogen  0   Units IV/HD  Venofer  0 Other 0  Assessment/Plan  1. Ischemic L foot, s/p revision of occluded L fem-pop BP graft today- per Dr. Kellie Simmering 2. ESRD -  HD on MWF ( [facility])  Use no heparin on hd in am 3. Hypertension/volume  - HD and Toprolxl 50mg   q day, Catapres.02mg  bid. Continue 4. Anemia  - hgb=12.4.12.9 on istat no epo or iron, fu am hgb 5. Metabolic bone disease -   zemplar on hd , binder with meal 6. Nutrition - supplement as needed 7. DM type 2= Diet controlled Ernest Haber, PA-C [place] [phone] 10/09/2012, 3:50 PM   Patient seen and examined and agree with assessment and plan as above.  Kelly Splinter  MD [employer] [phone] pgr    [phone] cell 10/09/2012, 5:10 PM

## 2012-10-09 NOTE — Anesthesia Postprocedure Evaluation (Signed)
  Anesthesia Post-op Note  Patient: [name]  Procedure(s) Performed: Procedure(s) (LRB) with comments: BYPASS GRAFT FEMORAL-POPLITEAL ARTERY (Left) - Redo left tibioperoneal trunk bypass with Gortex Graft 63mmx80cm. ENDARTERECTOMY TIBIOPERONEAL (Left) - Endarterectomy of tibioperoneal trunk and anterior tibial artery.  Patient Location: PACU  Anesthesia Type: General  Level of Consciousness: awake  Airway and Oxygen Therapy: Patient Spontanous Breathing  Post-op Pain: mild  Post-op Assessment: Post-op Vital signs reviewed  Post-op Vital Signs: stable  Complications: No apparent anesthesia complications

## 2012-10-09 NOTE — Op Note (Signed)
OPERATIVE REPORT  Date of Surgery: 10/09/2012  Surgeon: [name], MD  Assistant: Gerri Lins PA  Pre-op Diagnosis: Ischemic Left Leg; Occlusion of left fem-pop bypass graft secondary to severe popliteal and tibial occlusive disease  Post-op Diagnosis: Same Procedure: Procedure(s): Redo left external iliac to tibioperoneal trunk bypass using 6 mm Gore-Tex-propaten  Endarterectomy of tibioperoneal trunk and origin of anterior tibial artery Intraoperative arteriogram left leg  Anesthesia: General  EBL: [id number]  Complications: None  Procedure Details: Patient was taken to the operating room placed in the supine position at which time satisfactory general endotracheal anesthesia was administered. The left leg was prepped with Betadine scrub and solution draped in a routine sterile manner. Incision was made the inguinal area through the previous scar. There was a previously placed femoral-popliteal Gore-Tex graft. The graft was dissected free in dense scar tissue. There was severe calcific disease throughout the common femoral system. External iliac artery was exposed well up underneath the inguinal ligament. It was fairly soft proximally but then did become calcified just above the inguinal ligament. Following this a medial incision was made below the knee and the distal end of the previously placed Gore-Tex femoral-popliteal graft was dissected free. Closure of the entire tibioperoneal trunk and origin of anterior tibial and about 2 cm was performed. Native popliteal artery was also controlled. Following this the patient was heparinized. Initially a short longitudinal opening was made in the old Gore-Tex graft in the inguinal area Fogarty was passed distally down just below the distal anastomosis where it was going down the peroneal artery in the mid obstruction. It was known that the best runoff vessels anterior tibial artery in the distal revision or replacement would be necessary.  Therefore it was decided to replace the graft using external iliac his inflow. Initially we started in the distal wound opening for the tibial peroneal flow across with the Potts scissors extending up into the popliteal artery. There was plaque in the tibial peroneal trunk requiring an endarterectomy as well as the origin of the anterior tibial artery which is a 2-1/2-3 mm vessel. 3 Fogarty catheter passed down the anterior tibial artery to the ankle would pass down the peroneal artery to the distal calf. There is very sluggish backbleeding. A 6 mm Propaten-Gore-Tex graft was utilized. It was tunneled in the anatomic position. After spatulating it was anastomosed end to side to the tibioperoneal trunk with 6-0 Prolene. Following completion of this we turned her attention to the inguinal area. External iliac artery was occluded well at the deep inguinal ligament. The old Gore-Tex anastomosis to the common femoral artery was not disturbed. Common femoral artery was occluded with a clamp a longitudinal opening made in the external iliac beneath the inguinal ligament. There was severe calcific disease at the very distal end of the arthrotomy. Gore-Tex was spatulated and anastomosed end to side with 6-0 Prolene. Following appropriate flushing this was completed and clamps released there was a good pulse in the graft and loud biphasic flow in the anterior tibial artery at the ankle. Intraoperative arteriogram revealed a widely patent distal anastomosis with primary runoff with anterior tibial artery runoff to the perineal to the mid calf. Posterior tibial was known to be occluded. Protamine was given to reverse the heparin following adequate hemostasis the wounds were closed in layers with Vicryl in subcuticular fashion with Dermabond patient taken to the recovery room in stable condition  [name], MD 10/09/2012 11:19 AM

## 2012-10-09 NOTE — H&P (View-Only) (Signed)
Subjective:     Patient ID: [name], female   DOB: May 26, 1934, 78 y.o.   MRN: [id number]  HPI this 78 year old female had a left femoral-popliteal bypass graft performed by me in April of 2012. She has been having pain in the left foot for the past few weeks. She is not exactly clear when the symptoms started. She's also had a previous amputation of the left fifth toe. Her bypass was done with Gore-Tex. She has known common femoral occlusive disease. She had one vessel runoff through the anterior tibial artery at the time of her last bypass.  Past Medical History  Diagnosis Date  . Unspecified essential hypertension   . Hyperpotassemia   . Other specified cardiac dysrhythmias   . Difficulty in walking   . Gout, unspecified   . Type II or unspecified type diabetes mellitus without mention of complication, not stated as uncontrolled   . Mild mitral valve stenosis   . Arthritis   . Joint pain   . Leg pain   . Thyroid disease   . Gangrene     left fifth toe  . Peripheral vascular disease   . Anemia   . Hyperlipidemia   . Aortic valve sclerosis   . Shoulder fracture, right   . End stage renal disease     dialysis 02/25/12  . Blood transfusion     History  Substance Use Topics  . Smoking status: Former Smoker    Types: Cigarettes    Quit date: 12/31/1988  . Smokeless tobacco: Never Used  . Alcohol Use: No     hx of abuse stopped 1990    Family History  Problem Relation Age of Onset  . Peripheral vascular disease Mother     amputation  . Hypertension Mother   . Diabetes Mother     Allergies  Allergen Reactions  . Ace Inhibitors Other (See Comments)    Reaction unknown    Current outpatient prescriptions:amLODipine (NORVASC) 10 MG tablet, Take 10 mg by mouth daily., Disp: , Rfl: ;  aspirin EC 81 MG tablet, Take 81 mg by mouth daily., Disp: , Rfl: ;  calcium acetate (PHOSLO) 667 MG capsule, Take 667 mg by mouth daily. 1 tablet with every meal, 2 tabs with snacks,  Disp: , Rfl: ;  ciprofloxacin (CIPRO) 500 MG tablet, Take 500 mg by mouth 2 (two) times daily., Disp: , Rfl:  cloNIDine (CATAPRES) 0.2 MG tablet, Take 0.2 mg by mouth 2 (two) times daily., Disp: , Rfl: ;  hyoscyamine (LEVSIN, ANASPAZ) 0.125 MG tablet, Take 0.125 mg by mouth every 4 (four) hours as needed. For stomach pain, Disp: , Rfl: ;  metoprolol succinate (TOPROL-XL) 50 MG 24 hr tablet, Take 50 mg by mouth daily. Take with or immediately following a meal., Disp: , Rfl:  metroNIDAZOLE (FLAGYL) 500 MG tablet, Take 500 mg by mouth 2 (two) times daily., Disp: , Rfl: ;  pantoprazole (PROTONIX) 40 MG tablet, Take 40 mg by mouth daily., Disp: , Rfl:   BP 146/58  Pulse 55  Resp 16  Ht 4\' 10"  (1.473 m)  Wt 139 lb 14.4 oz (63.458 kg)  BMI 29.24 kg/m2  SpO2 100%  Body mass index is 29.24 kg/(m^2).          Review of Systems denies chest pain but does have dyspnea on exertion. Chronic hemodialysis Monday Wednesday and Friday. He now lives through right femoral vein cuffed dialysis catheter    Objective:   Physical Exam blood pressure  146/58 heart rate 85 respirations 16 Gen.-alert and oriented x3 in no apparent distress HEENT normal for age Lungs no rhonchi or wheezing Cardiovascular regular rhythm no murmurs carotid pulses 3+ palpable no bruits audible Abdomen soft nontender no palpable masses Musculoskeletal free of  major deformities Skin clear -no rashes Neurologic normal Lower extremities 1-2+ left femoral pulse palpable. No popliteal or distal pulses palpable. Motion and sensation intact left foot. Right leg with dialysis catheter in right femoral vein and 3+ femoral pulse palpable  Today I ordered a duplex scan of left leg bypass graft which is occluded with ABI equal to 0 left foot     Assessment:     #1 occluded left femoral-popliteal bypass graft several weeks ago with ischemia left foot #2 end-stage renal disease has hemodialysis Monday Wednesday and Friday    Plan:      Plan redo left femoral-popliteal bypass graft with Gore-Tex on Thursday, October 10-risks and benefits thoroughly discussed with patient and family and possibility of early occlusion of graft leading to left leg amputation. They would like to proceed

## 2012-10-09 NOTE — Interval H&P Note (Signed)
History and Physical Interval Note:  10/09/2012 7:36 AM  [name]  has presented today for surgery, with the diagnosis of Ischemic Left Leg Occlusion of left fem-pop bypass graft  The various methods of treatment have been discussed with the patient and family. After consideration of risks, benefits and other options for treatment, the patient has consented to  Procedure(s) (LRB) with comments: BYPASS GRAFT FEMORAL-POPLITEAL ARTERY (Left) - Redo Left Femoral-Popliteal Bypass Graft with Gortex  as a surgical intervention .  The patient's history has been reviewed, patient examined, no change in status, stable for surgery.  I have reviewed the patient's chart and labs.  Questions were answered to the patient's satisfaction.     [name]

## 2012-10-10 ENCOUNTER — Inpatient Hospital Stay (HOSPITAL_COMMUNITY): Payer: Medicare Other

## 2012-10-10 ENCOUNTER — Encounter (HOSPITAL_COMMUNITY): Payer: Self-pay | Admitting: *Deleted

## 2012-10-10 ENCOUNTER — Ambulatory Visit: Payer: Medicare Other | Admitting: Vascular Surgery

## 2012-10-10 LAB — GLUCOSE, CAPILLARY: Glucose-Capillary: 101 mg/dL — ABNORMAL HIGH (ref 70–99)

## 2012-10-10 LAB — BASIC METABOLIC PANEL
BUN: 26 mg/dL — ABNORMAL HIGH (ref 6–23)
Calcium: 9.3 mg/dL (ref 8.4–10.5)
Creatinine, Ser: 7.46 mg/dL — ABNORMAL HIGH (ref 0.50–1.10)
GFR calc Af Amer: 5 mL/min — ABNORMAL LOW (ref >90)
GFR calc non Af Amer: 5 mL/min — ABNORMAL LOW (ref >90)
Glucose, Bld: 146 mg/dL — ABNORMAL HIGH (ref 70–99)
Potassium: 4.3 mEq/L (ref 3.5–5.1)

## 2012-10-10 LAB — CBC
HCT: 28.1 % — ABNORMAL LOW (ref 36.0–46.0)
MCH: 31.2 pg (ref 26.0–34.0)
MCHC: 33.1 g/dL (ref 30.0–36.0)
MCV: 94.3 fL (ref 78.0–100.0)
RDW: 12.5 % (ref 11.5–15.5)

## 2012-10-10 MED ORDER — PARICALCITOL 5 MCG/ML IV SOLN
INTRAVENOUS | Status: AC
  Administered 2012-10-10: 1 ug via INTRAVENOUS
  Filled 2012-10-10: qty 1

## 2012-10-10 MED ORDER — MORPHINE SULFATE 2 MG/ML IJ SOLN
INTRAMUSCULAR | Status: AC
  Administered 2012-10-10: 2 mg via INTRAVENOUS
  Filled 2012-10-10: qty 1

## 2012-10-10 NOTE — Progress Notes (Signed)
[facility]  Progress Note Bypass Surgery  Date of Surgery: 10/09/2012  Procedure(s): Left redo BYPASS GRAFT FEMORAL-POPLITEAL ARTERY ENDARTERECTOMY TIBIOPERONEAL Surgeon: Surgeon(s): [name], MD  1 Day Post-Op  History of Present Illness  [name] is a 78 y.o. female who is S/P redo Left BYPASS GRAFT FEMORAL-POPLITEAL ARTERY ENDARTERECTOMY TIBIOPERONEAL .  The patient's pre-op symptoms of pain are Improved . Patients pain is well controlled.  Nauseated after dialysis  VASC. LAB Studies:        ABI: pend   Imaging: Dg Ang/ext/uni/or Left  10/09/2012  *RADIOLOGY REPORT*  Clinical Data: Peripheral vascular disease.  LEFT ANG/EXT/UNI/ OR  Technique: Single intraoperative angiogram image.  Comparison:  04/11/2012  Findings: The bypass graft to the tibioperoneal trunk is patent. Distal anastomosis is patent.  Anterior tibial and peroneal arteries are patent to the ankle.  Posterior tibial artery is diminutive and occludes distally. Previous bypass graft to the popliteal artery is no longer opacified.  IMPRESSION: Bypass graft is patent with two-vessel runoff.   Original Report Authenticated By: Jamas Lav, M.D.     Significant Diagnostic Studies: CBC Lab Results  Component Value Date   WBC 7.5 10/10/2012   HGB 9.3* 10/10/2012   HCT 28.1* 10/10/2012   MCV 94.3 10/10/2012   PLT 188 10/10/2012    BMET     Component Value Date/Time   NA 133* 10/10/2012 0711   K 4.3 10/10/2012 0711   CL 94* 10/10/2012 0711   CO2 26 10/10/2012 0711   GLUCOSE 146* 10/10/2012 0711   BUN 26* 10/10/2012 0711   CREATININE 7.46* 10/10/2012 0711   CALCIUM 9.3 10/10/2012 0711   GFRNONAA 5* 10/10/2012 0711   GFRAA 5* 10/10/2012 0711    COAG Lab Results  Component Value Date   INR 1.14 10/07/2012   INR 1.03 04/10/2012   INR 1.03 06/04/2011   No results found for this basename: PTT    Physical Examination  BP Readings from Last 3 Encounters:    10/10/12 142/53  10/10/12 142/53  10/07/12 139/71   Temp Readings from Last 3 Encounters:  10/10/12 98.3 F (36.8 C) Oral  10/10/12 98.3 F (36.8 C) Oral  10/07/12 98.5 F (36.9 C)    SpO2 Readings from Last 3 Encounters:  10/10/12 94%  10/10/12 94%  10/07/12 99%   Pulse Readings from Last 3 Encounters:  10/10/12 68  10/10/12 68  10/07/12 58    Pt is A&O x 3 left lower extremity: Incision/s is/are clean,dry.intact, and  healing without hematoma, erythema or drainage Limb is warm; with good color Th ray amputation site well healed  Left Dorsalis Pedis pulse is monophasic by Doppler Left Posterior tibial pulse is  absent Left peroneal pulse is monophasic by Doppler  Assessment/Plan: Pt. Doing well Post-op pain is controlled Wounds are healing well PT/OT for ambulation Continue wound care as ordered [place] [facility] [place] 10/10/2012 11:26 AM

## 2012-10-10 NOTE — Progress Notes (Signed)
Subjective:  Seen on HD, left groin pain, but better; no nausea recently.  Objective: Vital signs in last 24 hours: Temp:  [97.4 F (36.3 C)-98.4 F (36.9 C)] 98.4 F (36.9 C) (10/11 0356) Pulse Rate:  [55-89] 72  (10/11 0600) Resp:  [6-20] 10  (10/11 0600) BP: (116-160)/(41-68) 122/45 mmHg (10/11 0600) SpO2:  [95 %-100 %] 95 % (10/11 0600) Weight:  [62.7 kg (138 lb 3.7 oz)] 62.7 kg (138 lb 3.7 oz) (10/11 0625) Weight change: -0.6 kg (-1 lb 5.2 oz)  Intake/Output from previous day: 10/10 0701 - 10/11 0700 In: 1350 [I.V.:1250; IV Piggyback:100] Out: 400 [Blood:400] Intake/Output this shift: Total I/O In: 260 [I.V.:210; IV Piggyback:50] Out: -  EXAM: General appearance:  Alert, in no apparent distress Resp:  CTA with rales, rhonchi, or wheezes Cardio:  RRR without murmur  GI:  + BS, soft with LUQ tenderness Extremities:  No edema Access:  Right femoral catheter with BFR 400 cc/min  Lab Results:  Basename 10/09/12 0551 10/07/12 1348  WBC -- 5.8  HGB 12.9 12.4  HCT 38.0 38.2  PLT -- 210   BMET:  Basename 10/09/12 0551 10/07/12 1348  NA 135 132*  K 4.0 4.2  CL -- 94*  CO2 -- 24  GLUCOSE 143* 162*  BUN -- 22  CREATININE -- 6.81*  CALCIUM -- 10.0  ALBUMIN -- 3.7   No results found for this basename: PTH:2 in the last 72 hours Iron Studies: No results found for this basename: IRON,TIBC,TRANSFERRIN,FERRITIN in the last 72 hours  Dialysis Orders: Center: [facility] on MWF .  EDW 62kg HD Bath 2.0 k, 2.25 ca Time 3hrs 44min Heparin 94ml. Access right fem . Perm cath BFR 400 DFR 800 Zemplar 1.0 mcg IV/HD Epogen 0 Units IV/HD Venofer 0 Other 0  Assessment/Plan: 1. Ischemic L foot - s/p revision of occluded L fem-pop BP graft yesterday per Dr. Kellie Simmering. 2. ESRD - HD on MWF @ [place], K 4.  No heparin on HD. 3. Hypertension/volume - BP 126/51 most recently, Toprol XL 50mg  qd, Catapres.02mg  bid; wt 62.7 kg with EDW 62 kg.  UF goal 2 L today. 4. Anemia - Hgb 12.4, no Epo or iron.   CBC pending. 5. Metabolic bone disease - Last Ca 10, Zemplar on hd , binder with meal. 6. Nutrition - supplement as needed. 7. DM Type 2 - Diet controlled.    LOS: 1 day   [name],[name] 10/10/2012,6:58 AM  Patient seen and examined and agree with assessment and plan as above.  Margaret Splinter  MD [facility] [phone] pgr    [phone] cell 10/10/2012, 1:03 PM

## 2012-10-10 NOTE — Progress Notes (Signed)
Patient picked up by dialysis RN to go for HD treatment. VSS stable. Richarda Blade RN

## 2012-10-10 NOTE — Clinical Documentation Improvement (Signed)
Anemia Blood Loss Clarification  THIS DOCUMENT IS NOT A PERMANENT PART OF THE MEDICAL RECORD  RESPOND TO THE THIS QUERY, FOLLOW THE INSTRUCTIONS BELOW:  1. If needed, update documentation for the patient's encounter via the notes activity.  2. Access this query again and click edit on the In [employer].  3. After updating, or not, click F2 to complete all highlighted (required) fields concerning your review. Select "additional documentation in the medical record" OR "no additional documentation provided".  4. Click Sign note button.  5. The deficiency will fall out of your In Basket *Please let us know if you are not able to complete this workflow by phone or e-mail (listed below).        10/10/12  Dear [name]  In an effort to better capture your patient's severity of illness, reflect appropriate length of stay and utilization of resources, a review of the patient medical record has revealed the following indicators.    Based on your clinical judgment, please clarify and document in a progress note and/or discharge summary the clinical condition associated with the following supporting information:  In responding to this query please exercise your independent judgment.  The fact that a query is asked, does not imply that any particular answer is desired or expected.   Possible Clinical Conditions?   " Expected Acute Blood Loss Anemia  " Acute Blood Loss Anemia  " Acute on chronic blood loss anemia  " Other Condition  " Cannot Clinically Determine    Supporting Information: EBL 479ml per 10/10 Anesthesia record.  Diagnostics: H&H on 10/11:  9.3/28.1 H&H on 10/10:  12.9/38.0   Reviewed: No additional documentation provided.  Thank You,  Theron Arista,  Clinical Documentation Specialist:  Pager: [phone]

## 2012-10-10 NOTE — Progress Notes (Signed)
Pt vomitted clear thin green emesis, approx 116ml.  PRN med given per order.  Pt states she "sometimes" vomits after dialysis.  Wishes to continue with clear liquid tray for dinner.

## 2012-10-10 NOTE — Progress Notes (Signed)
PT Cancellation Note  Patient Details Name: [name] MRN: [id number] DOB: 1934-08-31   Cancelled Treatment:    Reason Eval/Treat Not Completed: Patient at procedure or test/unavailable (Pt currently at HD.)   Cyndia Bent 10/10/2012, 9:04 AM  10/10/2012 Cyndia Bent, PT, DPT [phone]

## 2012-10-10 NOTE — Evaluation (Signed)
Occupational Therapy Evaluation Patient Details Name: [name] MRN: [id number] DOB: July 29, 1934 Today's Date: 10/10/2012 Time: [id number] OT Time Calculation (min): 19 min  OT Assessment / Plan / Recommendation Clinical Impression  78 y.o. female admitted for Redo Left Femoral-Popliteal Bypass Graft due to occlusion.  Pt. with ESRD.  OT eval limited this pm due to pt. vomitting once EOB.  Pt. will benefit from OT to maximize safety and independence with BADLs to allow her to return home with assist from family.     OT Assessment  Patient needs continued OT Services    Follow Up Recommendations  No OT follow up;Supervision/Assistance - 24 hour    Barriers to Discharge None    Equipment Recommendations  3 in 1 bedside comode    Recommendations for Other Services    Frequency  Min 2X/week    Precautions / Restrictions Precautions Precautions: Fall Restrictions Weight Bearing Restrictions: No       ADL  Eating/Feeding: Simulated;Independent Where Assessed - Eating/Feeding: Edge of bed Grooming: Performed;Wash/dry face;Supervision/safety Where Assessed - Grooming: Unsupported sitting Upper Body Bathing: Simulated;Set up Where Assessed - Upper Body Bathing: Unsupported sitting Lower Body Bathing: Simulated;Moderate assistance Where Assessed - Lower Body Bathing: Supported sit to stand Upper Body Dressing: Simulated;Minimal assistance Where Assessed - Upper Body Dressing: Unsupported sitting Lower Body Dressing: Simulated;Maximal assistance Where Assessed - Lower Body Dressing: Supported sit to stand ADL Comments: Pt. moved to EOB with mod A.  While sitting EOB, pt. with onset of n/v.  RN alerted, and pt. was returned to supine    OT Diagnosis: Generalized weakness;Acute pain  OT Problem List: Decreased strength;Decreased activity tolerance;Impaired balance (sitting and/or standing);Decreased safety awareness;Decreased knowledge of use of DME or AE;Pain OT Treatment  Interventions: Self-care/ADL training;DME and/or AE instruction;Therapeutic activities;Patient/family education;Balance training   OT Goals Acute Rehab OT Goals OT Goal Formulation: With patient Time For Goal Achievement: 10/24/12 Potential to Achieve Goals: Good ADL Goals Pt Will Perform Grooming: with supervision;Standing at sink ADL Goal: Grooming - Progress: Goal set today Pt Will Perform Lower Body Bathing: with supervision;Sit to stand from chair;Sit to stand from bed ADL Goal: Lower Body Bathing - Progress: Goal set today Pt Will Perform Lower Body Dressing: with supervision;Sit to stand from chair;Sit to stand from bed ADL Goal: Lower Body Dressing - Progress: Goal set today Pt Will Transfer to Toilet: with supervision;Ambulation;Comfort height toilet;3-in-1 ADL Goal: Toilet Transfer - Progress: Goal set today Pt Will Perform Toileting - Clothing Manipulation: with supervision;Standing ADL Goal: Toileting - Clothing Manipulation - Progress: Goal set today  Visit Information  Last OT Received On: 10/10/12 Assistance Needed: +1    Subjective Data  Subjective: "Do I have to sit up today?" Patient Stated Goal: To get better   Prior Functioning     Home Living Lives With: Spouse;Daughter;Other (Comment) (grandchild) Available Help at Discharge: Family;Available 24 hours/day Type of Home: House Home Access: Stairs to enter [employer] of Steps: 5 Entrance Stairs-Rails: Right;Left;Can reach both Home Layout: One level Bathroom Shower/Tub: Product/process development scientist: Standard (one of each) Bathroom Accessibility: Yes How Accessible: Accessible via walker Home Adaptive Equipment: Wheelchair - manual;Walker - rolling Additional Comments: Pt. reports that husband is in poor health and "can barely take care of himself"  She reports that dtr will be able to assist her at discharge Prior Function Level of Independence: Independent Able to Take Stairs?:  Yes Driving: No (Is able, but she reports she hasn't in a long time) Vocation: Retired Comments:  Pt reports she takes the bus to dialysis Communication Communication: No difficulties Dominant Hand: Right         Vision/Perception     Cognition  Overall Cognitive Status: Appears within functional limits for tasks assessed/performed Arousal/Alertness: Awake/alert Orientation Level: Appears intact for tasks assessed Behavior During Session: [facility] for tasks performed    Extremity/Trunk Assessment Right Upper Extremity Assessment RUE ROM/Strength/Tone: Within functional levels RUE Coordination: WFL - gross/fine motor Left Upper Extremity Assessment LUE ROM/Strength/Tone: Within functional levels LUE Coordination: WFL - gross/fine motor Trunk Assessment Trunk Assessment: Normal     Mobility Bed Mobility Bed Mobility: Left Sidelying to Sit;Supine to Sit;Sitting - Scoot to [employer] of Bed;Scooting to [facility] Left Sidelying to Sit: 3: Mod assist Supine to Sit: 4: Min assist Sitting - Scoot to Edge of Bed: 3: Mod assist Scooting to HOB: 1: +2 Total assist Scooting to [facility]: Patient Percentage: 0% Details for Bed Mobility Assistance: Pt. requires assist for Lt. LE and assist for UB     Shoulder Instructions     Exercise     Balance Balance Balance Assessed: Yes Static Sitting Balance Static Sitting - Balance Support: Feet supported;No upper extremity supported Static Sitting - Level of Assistance: 5: Stand by assistance   End of Session OT - End of Session Activity Tolerance: Treatment limited secondary to medical complications (Comment) (n/v) Patient left: in bed;with call bell/phone within reach Nurse Communication: Other (comment) (Pt. with n/v)  GO     [name] M 10/10/2012, 3:53 PM

## 2012-10-11 DIAGNOSIS — Z48812 Encounter for surgical aftercare following surgery on the circulatory system: Secondary | ICD-10-CM

## 2012-10-11 LAB — GLUCOSE, CAPILLARY
Glucose-Capillary: 97 mg/dL (ref 70–99)
Glucose-Capillary: 131 mg/dL — ABNORMAL HIGH (ref 70–99)

## 2012-10-11 NOTE — Evaluation (Signed)
Physical Therapy Evaluation Patient Details Name: [name] MRN: [id number] DOB: 06-May-1934 Today's Date: 10/11/2012 Time: 1050-1110 PT Time Calculation (min): 20 min  PT Assessment / Plan / Recommendation Clinical Impression  Pt s/p Lt leg bypass.  Pt with supportive daughter at home and pt should progress and be able to return home with family.    PT Assessment  Patient needs continued PT services    Follow Up Recommendations  Home health PT;Supervision - Intermittent    Does the patient have the potential to tolerate intense rehabilitation      Barriers to Discharge        Equipment Recommendations  None recommended by PT    Recommendations for Other Services     Frequency Min 3X/week    Precautions / Restrictions Precautions Precautions: Fall   Pertinent Vitals/Pain N/A      Mobility  Bed Mobility Supine to Sit: 4: Min assist;HOB elevated Sitting - Scoot to Edge of Bed: 3: Mod assist Details for Bed Mobility Assistance: assist to bring trunk up Transfers Transfers: Sit to Stand;Stand to Sit Sit to Stand: 4: Min assist;With upper extremity assist;From bed Stand to Sit: 4: Min assist;With upper extremity assist;With armrests;To chair/3-in-1 Details for Transfer Assistance: verbal cues for hand placement when getting up Ambulation/Gait Ambulation/Gait Assistance: 4: Min assist Ambulation Distance (Feet): 100 Feet Assistive device: Rolling walker Ambulation/Gait Assistance Details: verbal cues for sequence Gait Pattern: Step-to pattern;Decreased step length - right;Decreased step length - left;Trunk flexed    Shoulder Instructions     Exercises     PT Diagnosis: Difficulty walking;Generalized weakness  PT Problem List: Decreased strength;Decreased activity tolerance;Decreased mobility;Decreased knowledge of use of DME PT Treatment Interventions: DME instruction;Gait training;Stair training;Functional mobility training;Patient/family  education;Therapeutic activities;Therapeutic exercise   PT Goals Acute Rehab PT Goals PT Goal Formulation: With patient Time For Goal Achievement: 10/18/12 Potential to Achieve Goals: Good Pt will go Supine/Side to Sit: with supervision PT Goal: Supine/Side to Sit - Progress: Goal set today Pt will go Sit to Supine/Side: with supervision PT Goal: Sit to Supine/Side - Progress: Goal set today Pt will go Sit to Stand: with supervision PT Goal: Sit to Stand - Progress: Goal set today Pt will go Stand to Sit: with supervision PT Goal: Stand to Sit - Progress: Goal set today Pt will Ambulate: 51 - 150 feet;with supervision;with least restrictive assistive device PT Goal: Ambulate - Progress: Goal set today Pt will Go Up / Down Stairs: 3-5 stairs;with min assist;with rail(s) PT Goal: Up/Down Stairs - Progress: Goal set today  Visit Information  Last PT Received On: 10/11/12 Assistance Needed: +1    Subjective Data  Subjective: Pt states she may have a hard time stepping up into the [facility] transport for HD. Patient Stated Goal: Return home   Prior [place] With: Spouse;Daughter;Other (Comment) (grandchild) Available Help at Discharge: Family;Available 24 hours/day Type of Home: House Home Access: Stairs to enter [employer] of Steps: 5 Entrance Stairs-Rails: Right;Left;Can reach both Home Layout: One level Bathroom Shower/Tub: Tub/shower unit;Curtain (pt takes sponge bath) Bathroom Toilet: Standard Bathroom Accessibility: Yes How Accessible: Accessible via walker Home Adaptive Equipment: Wheelchair - manual;Walker - rolling;Bedside commode/3-in-1 Additional Comments: Husband in poor health but daughter can assist at dc. Prior Function Level of Independence: Independent Able to Take Stairs?: Yes Driving: No Vocation: Retired Comments: Programmer, applications to HD Communication Communication: No difficulties Dominant Hand: Right    Cognition  Overall  Cognitive Status: Appears within functional limits for  tasks assessed/performed Arousal/Alertness: Awake/alert Orientation Level: Appears intact for tasks assessed Behavior During Session: [facility] for tasks performed    Extremity/Trunk Assessment Right Lower Extremity Assessment RLE ROM/Strength/Tone: Deficits RLE ROM/Strength/Tone Deficits: grossly 4/5 Left Lower Extremity Assessment LLE ROM/Strength/Tone: Deficits;Due to pain LLE ROM/Strength/Tone Deficits: grossly 3/5   Balance Static Standing Balance Static Standing - Balance Support: Bilateral upper extremity supported (on walker) Static Standing - Level of Assistance: 5: Stand by assistance  End of Session PT - End of Session Equipment Utilized During Treatment: Gait belt Activity Tolerance: Patient tolerated treatment well Patient left: in chair;with call bell/phone within reach Nurse Communication: Mobility status  GP     Chanz Cahall 10/11/2012, 11:27 AM  Suanne Marker PT [phone]

## 2012-10-11 NOTE — Progress Notes (Addendum)
[facility] OF [place]  Progress Note Bypass Surgery  Date of Surgery: 10/09/2012  Procedure(s): BYPASS GRAFT FEMORAL-POPLITEAL ARTERY ENDARTERECTOMY TIBIOPERONEAL Surgeon: Surgeon(s): [name], MD  2 Days Post-Op  History of Present Illness  [name] is a 78 y.o. female who is S/P Procedure(s): BYPASS GRAFT FEMORAL-POPLITEAL ARTERY ENDARTERECTOMY TIBIOPERONEAL left.  The patient's pre-op symptoms of pain  are Improved . Patients pain is well controlled.    VASC. LAB Studies:        ABI: pending   Imaging: Dg Ang/ext/uni/or Left  10/09/2012  *RADIOLOGY REPORT*  Clinical Data: Peripheral vascular disease.  LEFT ANG/EXT/UNI/ OR  Technique: Single intraoperative angiogram image.  Comparison:  04/11/2012  Findings: The bypass graft to the tibioperoneal trunk is patent. Distal anastomosis is patent.  Anterior tibial and peroneal arteries are patent to the ankle.  Posterior tibial artery is diminutive and occludes distally. Previous bypass graft to the popliteal artery is no longer opacified.  IMPRESSION: Bypass graft is patent with two-vessel runoff.   Original Report Authenticated By: Jamas Lav, M.D.     Significant Diagnostic Studies: CBC Lab Results  Component Value Date   WBC 7.5 10/10/2012   HGB 9.3* 10/10/2012   HCT 28.1* 10/10/2012   MCV 94.3 10/10/2012   PLT 188 10/10/2012    BMET     Component Value Date/Time   NA 133* 10/10/2012 0711   K 4.3 10/10/2012 0711   CL 94* 10/10/2012 0711   CO2 26 10/10/2012 0711   GLUCOSE 146* 10/10/2012 0711   BUN 26* 10/10/2012 0711   CREATININE 7.46* 10/10/2012 0711   CALCIUM 9.3 10/10/2012 0711   GFRNONAA 5* 10/10/2012 0711   GFRAA 5* 10/10/2012 0711    COAG Lab Results  Component Value Date   INR 1.14 10/07/2012   INR 1.03 04/10/2012   INR 1.03 06/04/2011   No results found for this basename: PTT    Physical Examination  BP Readings from Last 3 Encounters:  10/11/12 134/77    10/11/12 134/77  10/07/12 139/71   Temp Readings from Last 3 Encounters:  10/11/12 98.4 F (36.9 C) Oral  10/11/12 98.4 F (36.9 C) Oral  10/07/12 98.5 F (36.9 C)    SpO2 Readings from Last 3 Encounters:  10/11/12 96%  10/11/12 96%  10/07/12 99%   Pulse Readings from Last 3 Encounters:  10/11/12 78  10/11/12 78  10/07/12 58    Pt is A&O x 3  left lower extremity: Incision/s is/are clean,dry.intact, and healing  without hematoma, erythema or drainage  Limb is warm; with good color  Th ray amputation site well healed  Left Dorsalis Pedis pulse is monophasic by Doppler  Left Posterior tibial pulse is absent  Left peroneal pulse is monophasic by Doppler    Assessment/Plan: Pt. Doing well Post-op pain is controlled Wounds are clean, dry, intact or healing well PT/OT for ambulation Continue wound care as ordered  Roxy Horseman [id number] 10/11/2012 10:08 AM    I agree with the above. The patient's incisions are healing nicely. She will likely be discharged home tomorrow.  [name]

## 2012-10-11 NOTE — Progress Notes (Signed)
VASCULAR LAB PRELIMINARY  ARTERIAL  ABI completed:    RIGHT    LEFT    PRESSURE WAVEFORM  PRESSURE WAVEFORM  BRACHIAL 122 Triphasic BRACHIAL 159 Triphasic  DP 157 Biphasic DP    AT   AT 206 Biphasic  PT 155 Monophasic PT  Minute dampened monophasic Doppler signal probably  some type of collateral  PER   PER 178 Biphasic  GREAT TOE  NA GREAT TOE  NA    RIGHT LEFT  ABI 0.99 1.3     Marieli Rudy, RVS 10/11/2012, 3:16 PM

## 2012-10-11 NOTE — Progress Notes (Signed)
Subjective:  Seen on HD, left groin pain, but better; no nausea today, wants solid food.  Objective: Vital signs in last 24 hours: Temp:  [98.1 F (36.7 C)-98.6 F (37 C)] 98.4 F (36.9 C) (10/12 0430) Pulse Rate:  [68-87] 78  (10/12 0430) Resp:  [10-21] 19  (10/12 0430) BP: (107-142)/(42-99) 134/77 mmHg (10/12 0430) SpO2:  [94 %-100 %] 96 % (10/12 0430) Weight:  [61.2 kg (134 lb 14.7 oz)-62.5 kg (137 lb 12.6 oz)] 62.5 kg (137 lb 12.6 oz) (10/12 0430) Weight change: -1.5 kg (-3 lb 4.9 oz)  Intake/Output from previous day: 10/11 0701 - 10/12 0700 In: 180 [P.O.:180] Out: 1052  Intake/Output this shift:   EXAM: General appearance:  Alert, in no apparent distress Resp:  CTA with rales, rhonchi, or wheezes Cardio:  RRR without murmur  GI:  + BS, soft with LUQ tenderness Extremities:  No edema Access:  Right femoral catheter with BFR 400 cc/min  Lab Results:  Basename 10/10/12 0711 10/09/12 0551  WBC 7.5 --  HGB 9.3* 12.9  HCT 28.1* 38.0  PLT 188 --   BMET:   Basename 10/10/12 0711 10/09/12 0551  NA 133* 135  K 4.3 4.0  CL 94* --  CO2 26 --  GLUCOSE 146* 143*  BUN 26* --  CREATININE 7.46* --  CALCIUM 9.3 --  ALBUMIN -- --   No results found for this basename: PTH:2 in the last 72 hours Iron Studies: No results found for this basename: IRON,TIBC,TRANSFERRIN,FERRITIN in the last 72 hours  Dialysis Orders: Center: [facility] on MWF .  EDW 62kg HD Bath 2.0 k, 2.25 ca Time 3hrs 89min Heparin 72ml. Access right fem . Perm cath BFR 400 DFR 800 Zemplar 1.0 mcg IV/HD Epogen 0 Units IV/HD Venofer 0 Other 0  Assessment/Plan: 1. Ischemic L foot - s/p revision of occluded L fem-pop BP graft 10/10 per Dr. Kellie Simmering. 2. ESRD - HD on MWF @ [place], K 4.  No heparin on HD. Right at dry wt.  3. Hypertension/volume - BP 126/51 most recently, Toprol XL 50mg  qd, Catapres.02mg  bid. 4. Anemia - Hgb 12.4, no Epo or iron.  CBC pending. 5. Metabolic bone disease - Last Ca 10, Zemplar on hd , binder  with meal. 6. Nutrition - supplement as needed. Advance solid food. 7. DM Type 2 - Diet controlled.   Kelly Splinter  MD [employer] [phone] pgr    [phone] cell 10/11/2012, 8:24 AM

## 2012-10-12 LAB — GLUCOSE, CAPILLARY: Glucose-Capillary: 115 mg/dL — ABNORMAL HIGH (ref 70–99)

## 2012-10-12 LAB — TYPE AND SCREEN
Antibody Screen: NEGATIVE
Unit division: 0

## 2012-10-12 MED ORDER — CALCIUM ACETATE 667 MG PO CAPS
667.0000 mg | ORAL_CAPSULE | Freq: Three times a day (TID) | ORAL | Status: DC
  Administered 2012-10-12 – 2012-10-13 (×2): 667 mg via ORAL
  Filled 2012-10-12 (×6): qty 1

## 2012-10-12 MED ORDER — HEPARIN SODIUM (PORCINE) 1000 UNIT/ML DIALYSIS: 2000.0000 [IU] | INTRAMUSCULAR | Status: DC | PRN

## 2012-10-12 NOTE — Progress Notes (Addendum)
[facility] OF [place]  Progress Note Bypass Surgery  Date of Surgery: 10/09/2012  Procedure(s): BYPASS GRAFT FEMORAL-POPLITEAL ARTERY ENDARTERECTOMY TIBIOPERONEAL Surgeon: Surgeon(s): [name], MD  3 Days Post-Op  History of Present Illness  [name] is a 78 y.o. female who is S/P Procedure(s): BYPASS GRAFT FEMORAL-POPLITEAL ARTERY ENDARTERECTOMY TIBIOPERONEAL left.  The patient's pre-op symptoms of pain are Improved . Patients pain is well controlled.    VASC. LAB Studies:        ABI: pending   Imaging: No results found.  Significant Diagnostic Studies: CBC Lab Results  Component Value Date   WBC 7.5 10/10/2012   HGB 9.3* 10/10/2012   HCT 28.1* 10/10/2012   MCV 94.3 10/10/2012   PLT 188 10/10/2012    BMET     Component Value Date/Time   NA 133* 10/10/2012 0711   K 4.3 10/10/2012 0711   CL 94* 10/10/2012 0711   CO2 26 10/10/2012 0711   GLUCOSE 146* 10/10/2012 0711   BUN 26* 10/10/2012 0711   CREATININE 7.46* 10/10/2012 0711   CALCIUM 9.3 10/10/2012 0711   GFRNONAA 5* 10/10/2012 0711   GFRAA 5* 10/10/2012 0711    COAG Lab Results  Component Value Date   INR 1.14 10/07/2012   INR 1.03 04/10/2012   INR 1.03 06/04/2011   No results found for this basename: PTT    Physical Examination  BP Readings from Last 3 Encounters:  10/12/12 109/69  10/12/12 109/69  10/07/12 139/71   Temp Readings from Last 3 Encounters:  10/12/12 98.7 F (37.1 C) Oral  10/12/12 98.7 F (37.1 C) Oral  10/07/12 98.5 F (36.9 C)    SpO2 Readings from Last 3 Encounters:  10/12/12 94%  10/12/12 94%  10/07/12 99%   Pulse Readings from Last 3 Encounters:  10/12/12 76  10/12/12 76  10/07/12 58    Pt is A&O x 3 left lower extremity: Incision/s is/are clean,dry.intact, and healing  without hematoma, erythema or drainage  Limb is warm; with good color  The ray amputation site well healing Left Dorsalis Pedis pulse is monophasic by Doppler    Left Posterior tibial pulse is absent  Left peroneal pulse is monophasic by Doppler   Assessment/Plan: Pt. Doing well Post-op pain is controlled Wounds are clean, dry, intact or healing well PT/OT for ambulation Continue wound care as ordered  Roxy Horseman [id number] 10/12/2012 9:09 AM    I agree with the above.  Continue with pain control. Anticipate discharge tomorrow   [name]

## 2012-10-12 NOTE — Progress Notes (Signed)
Pt refused to walk in the hallway today. She wanted to stay in bed. Pt educated on the importance of ambulation after surgery. Will continue to encourage pt to ambulate.   Vella Raring, RN

## 2012-10-12 NOTE — Progress Notes (Signed)
Subjective:  Seen on HD, left groin pain, but better; no nausea today, wants solid food.  Objective: Vital signs in last 24 hours: Temp:  [98.4 F (36.9 C)-98.7 F (37.1 C)] 98.7 F (37.1 C) (10/13 0443) Pulse Rate:  [76-88] 80  (10/13 0957) Resp:  [18-20] 20  (10/13 0443) BP: (109-144)/(48-69) 114/61 mmHg (10/13 0957) SpO2:  [94 %-99 %] 94 % (10/13 0443) Weight:  [62.687 kg (138 lb 3.2 oz)] 62.687 kg (138 lb 3.2 oz) (10/13 0443) Weight change: 1.487 kg (3 lb 4.5 oz)  Intake/Output from previous day:   Intake/Output this shift:   EXAM: General appearance:  Alert, in no apparent distress Resp:  CTA with rales, rhonchi, or wheezes Cardio:  RRR without murmur  GI:  + BS, soft with LUQ tenderness Extremities:  No edema Access:  Right femoral catheter with BFR 400 cc/min  Lab Results:  Basename 10/10/12 0711  WBC 7.5  HGB 9.3*  HCT 28.1*  PLT 188   BMET:   Basename 10/10/12 0711  NA 133*  K 4.3  CL 94*  CO2 26  GLUCOSE 146*  BUN 26*  CREATININE 7.46*  CALCIUM 9.3  ALBUMIN --   No results found for this basename: PTH:2 in the last 72 hours Iron Studies: No results found for this basename: IRON,TIBC,TRANSFERRIN,FERRITIN in the last 72 hours  Dialysis Orders: Center: [facility] on MWF .  EDW 62kg HD Bath 2.0 k, 2.25 ca Time 3hrs 67min Heparin 27ml. Access right fem . Perm cath BFR 400 DFR 800 Zemplar 1.0 mcg IV/HD Epogen 0 Units IV/HD Venofer 0 Other 0  Assessment/Plan: 1. Ischemic L foot - s/p revision of occluded L fem-pop BP graft 10/10 per Dr. Kellie Simmering. 2. ESRD - HD on MWF @ [place], K 4.  No heparin on HD. Right at dry wt.  3. Hypertension/volume - BP 126/51 most recently, Toprol XL 50mg  qd, Catapres.02mg  bid. 4. Anemia - Hgb 12.4, no Epo or iron.  CBC pending. 5. Metabolic bone disease - Last Ca 10, Zemplar on hd , binder with meal. 6. Nutrition - supplement as needed. Advanced to solid food. 7. DM Type 2 - Diet controlled. 8. Dispo- per primary   Kelly Splinter   MD [facility] [phone] pgr    [phone] cell 10/12/2012, 11:09 AM

## 2012-10-12 NOTE — Progress Notes (Signed)
Pt complained of stomach hurting from constipation. Pt requested Sorbitol and received medication from pharmacy. Pt refused medication at bedside. Will continue to monitor.

## 2012-10-13 ENCOUNTER — Telehealth: Payer: Self-pay | Admitting: Vascular Surgery

## 2012-10-13 ENCOUNTER — Encounter (HOSPITAL_COMMUNITY): Payer: Self-pay | Admitting: Vascular Surgery

## 2012-10-13 ENCOUNTER — Inpatient Hospital Stay (HOSPITAL_COMMUNITY): Payer: Medicare Other

## 2012-10-13 LAB — GLUCOSE, CAPILLARY
Glucose-Capillary: 112 mg/dL — ABNORMAL HIGH (ref 70–99)
Glucose-Capillary: 155 mg/dL — ABNORMAL HIGH (ref 70–99)

## 2012-10-13 LAB — RENAL FUNCTION PANEL
CO2: 24 mEq/L (ref 19–32)
Calcium: 8.7 mg/dL (ref 8.4–10.5)
Creatinine, Ser: 9.94 mg/dL — ABNORMAL HIGH (ref 0.50–1.10)
GFR calc Af Amer: 4 mL/min — ABNORMAL LOW (ref >90)
GFR calc non Af Amer: 3 mL/min — ABNORMAL LOW (ref >90)
Phosphorus: 5.7 mg/dL — ABNORMAL HIGH (ref 2.3–4.6)
Sodium: 129 mEq/L — ABNORMAL LOW (ref 135–145)

## 2012-10-13 LAB — CBC
MCH: 30.9 pg (ref 26.0–34.0)
MCHC: 32.6 g/dL (ref 30.0–36.0)
MCV: 94.7 fL (ref 78.0–100.0)
Platelets: 206 10*3/uL (ref 150–400)
RDW: 12.6 % (ref 11.5–15.5)

## 2012-10-13 MED ORDER — OXYCODONE HCL 5 MG PO TABS
ORAL_TABLET | ORAL | Status: AC
  Administered 2012-10-13: 5 mg via ORAL
  Filled 2012-10-13: qty 1

## 2012-10-13 MED ORDER — PARICALCITOL 5 MCG/ML IV SOLN
INTRAVENOUS | Status: AC
  Administered 2012-10-13: 1 ug via INTRAVENOUS
  Filled 2012-10-13: qty 1

## 2012-10-13 MED ORDER — OXYCODONE HCL 5 MG PO TABS: 5.0000 mg | ORAL_TABLET | Freq: Four times a day (QID) | ORAL | Status: DC | PRN

## 2012-10-13 NOTE — Care Management Note (Signed)
    Page 1 of 2   10/13/2012     2:50:07 PM   CARE MANAGEMENT NOTE 10/13/2012  Patient:  AARINI, KETCHMARK   Account Number:  [id number]  Date Initiated:  10/09/2012  Documentation initiated by:  MAYO,HENRIETTA  Subjective/Objective Assessment:   78 yr-old female adm with dx of ischemic (L) leg for BPG; lives with spouse and granddaughter, has rolling walker and 3-N-1, h/o home health services through [facility]     Action/Plan:   Anticipated DC Date:  10/13/2012   Anticipated DC Plan:  [place]  In-house referral  Clinical Social Worker      [place]  CM consult      [facility] Choice  HOME HEALTH   Choice offered to / List presented to:  C-1 Patient        [place] arranged  HH-2 PT      [place].   Status of service:  Completed, signed off Medicare Important Message given?   (If response is "NO", the following Medicare IM given date fields will be blank) Date Medicare IM given:   Date Additional Medicare IM given:    Discharge Disposition:  [place]  Per UR Regulation:  Reviewed for med. necessity/level of care/duration of stay  If discussed at Long Length of Stay Meetings, dates discussed:    Comments:  PCP:  Dr. Maury Dus  10/13/12 Kati Riggenbach,RN,BSN  [place]; NEEDS HHPT FOLLOW UP. AGREEABLE TO [facility], PER CHOICE.  REFERRAL TO [facility].  NO DME NEEDS, PER PT.  START OF CARE 24-48H POST DC DATE.

## 2012-10-13 NOTE — Telephone Encounter (Signed)
Message copied by Gena Fray on Mon Oct 13, 2012  4:08 PM ------      Message from: Mena Goes      Created: Mon Oct 13, 2012 12:37 PM      Regarding: schedule                   ----- Message -----         From: Gabriel Earing, PA         Sent: 10/13/2012  12:34 PM           To: Mena Goes, CMA            S/p Redo left external iliac to tibioperoneal trunk bypass using 6 mm Gore-Tex-propaten        Endarterectomy of tibioperoneal trunk and origin of anterior tibial artery        Intraoperative arteriogram left leg            Fu with Dr. Kellie Simmering in 2 weeks.            Thanks,      Aldona Bar

## 2012-10-13 NOTE — Discharge Summary (Signed)
[facility] Discharge Summary  [name] 05-Sep-1934 78 y.o. female  [id number]  Admission Date: 10/09/2012  Discharge Date: 10/12/12  Physician: [name], MD  Admission Diagnosis: Ischemic Left Leg Occlusion of left fem-pop bypass graft   HPI:   This is a 78 y.o. female had a left femoral-popliteal bypass graft performed by me in April of 2012. She has been having pain in the left foot for the past few weeks. She is not exactly clear when the symptoms started. She's also had a previous amputation of the left fifth toe. Her bypass was done with Gore-Tex. She has known common femoral occlusive disease. She had one vessel runoff through the anterior tibial artery at the time of her last bypass.  Hospital Course:  The patient was admitted to the hospital and taken to the operating room on 10/09/2012 and underwent  Redo left external iliac to tibioperoneal trunk bypass using 6 mm Gore-Tex-propaten  Endarterectomy of tibioperoneal trunk and origin of anterior tibial artery  Intraoperative arteriogram left leg The pt tolerated the procedure well and was transported to the PACU in good condition.   By POD 1, she was doing well and transferred to the telemetry unit.  She continued HD while in the hospital.  She was discharged on POD 3 after HD.  The remainder of the hospital course consisted of increasing mobilization and increasing intake of solids without difficulty.  CBC    Component Value Date/Time   WBC 6.6 10/13/2012 0750   WBC 6.2 11/06/2006 0920   RBC 2.85* 10/13/2012 0750   RBC 4.25 11/06/2006 0920   HGB 8.8* 10/13/2012 0750   HGB 12.2 11/06/2006 0920   HCT 27.0* 10/13/2012 0750   HCT 37.7 11/06/2006 0920   PLT 206 10/13/2012 0750   PLT 375 11/06/2006 0920   MCV 94.7 10/13/2012 0750   MCV 88.7 11/06/2006 0920   MCH 30.9 10/13/2012 0750   MCH 28.7 11/06/2006 0920   MCHC 32.6 10/13/2012 0750   MCHC 32.4 11/06/2006 0920   RDW 12.6 10/13/2012 0750    RDW 15.5* 11/06/2006 0920   LYMPHSABS 1.7 06/18/2011 0533   LYMPHSABS 1.5 11/06/2006 0920   MONOABS 1.0 06/18/2011 0533   MONOABS 0.5 11/06/2006 0920   EOSABS 0.8* 06/18/2011 0533   EOSABS 0.2 11/06/2006 0920   BASOSABS 0.1 06/18/2011 0533   BASOSABS 0.0 11/06/2006 0920    BMET    Component Value Date/Time   NA 129* 10/13/2012 0750   K 5.5* 10/13/2012 0750   CL 92* 10/13/2012 0750   CO2 24 10/13/2012 0750   GLUCOSE 122* 10/13/2012 0750   BUN 38* 10/13/2012 0750   CREATININE 9.94* 10/13/2012 0750   CALCIUM 8.7 10/13/2012 0750   GFRNONAA 3* 10/13/2012 0750   GFRAA 4* 10/13/2012 0750     Discharge Instructions:   The patient is discharged to home with extensive instructions on wound care and progressive ambulation.  They are instructed not to drive or perform any heavy lifting until returning to see the physician in his office.  Discharge Orders    Future Orders Please Complete By Expires   Resume previous diet      Driving Restrictions      Comments:   No driving for 2 weeks   Lifting restrictions      Comments:   No lifting for 6 weeks   Call MD for:  temperature >100.5      Call MD for:  redness, tenderness, or signs of  infection (pain, swelling, bleeding, redness, odor or green/yellow discharge around incision site)      Call MD for:  severe or increased pain, loss or decreased feeling  in affected limb(s)      may wash over wound with mild soap and water         Discharge Diagnosis:  Ischemic Left Leg Occlusion of left fem-pop bypass graft  Secondary Diagnosis: Patient Active Problem List  Diagnosis  . DIABETES MELLITUS, TYPE II  . GOUT  . HYPERKALEMIA  . HYPERTENSION  . MITRAL VALVE DISORDERS  . BRADYCARDIA  . COPD  . RENAL FAILURE, END STAGE  . WALKING DIFFICULTY  . PALPITATIONS  . CHEST PAIN-PRECORDIAL  . Foot pain  . Peripheral vascular disease, unspecified  . Aftercare following surgery of the circulatory system, NEC  . Atherosclerosis of native  arteries of the extremities with intermittent claudication  . Tachycardia  . Chest pain  . PVD (peripheral vascular disease) with claudication   Past Medical History  Diagnosis Date  . Unspecified essential hypertension   . Hyperpotassemia   . Difficulty in walking   . Gout, unspecified   . Mild mitral valve stenosis   . Arthritis   . Joint pain   . Leg pain   . Thyroid disease   . Gangrene     left fifth toe  . Peripheral vascular disease   . Anemia   . Hyperlipidemia   . Aortic valve sclerosis   . Shoulder fracture, right   . Blood transfusion   . Asthma     hx of  . Pneumonia     hx of  . Bronchitis     hx of  . Type II or unspecified type diabetes mellitus without mention of complication, not stated as uncontrolled     "states not on any medication at this time"  . End stage renal disease     dialysis 02/25/12,  M, W, [place] [facility]  . GERD (gastroesophageal reflux disease)     hx of  . Chronic back pain   . Fractured shoulder     hx of  . Other specified cardiac dysrhythmias     sees Dr. Gray Bernhardt, [address] Medication Instructions [id number]   Printed on:10/13/12 1235  Medication Information                    cloNIDine (CATAPRES) 0.2 MG tablet Take 0.2 mg by mouth 2 (two) times daily.           hyoscyamine (LEVSIN, ANASPAZ) 0.125 MG tablet Take 0.125 mg by mouth every 4 (four) hours as needed. For abdominal cramping           ciprofloxacin (CIPRO) 500 MG tablet Take 500 mg by mouth every 12 (twelve) hours.            aspirin EC 81 MG tablet Take 81 mg by mouth daily.           metoprolol succinate (TOPROL-XL) 50 MG 24 hr tablet Take 50 mg by mouth daily. Take with or immediately following a meal.           calcium acetate (PHOSLO) 667 MG capsule Take 667 mg by mouth 3 (three) times daily. 1 tablet with every meal, 2 tabs with snacks           sorbitol 70 % solution Take 30 mLs by mouth daily as needed. For  bladder  oxyCODONE (OXY IR/ROXICODONE) 5 MG immediate release tablet Take 1-2 tablets (5-10 mg total) by mouth every 6 (six) hours as needed. #30 NR            Disposition: home  Patient's condition: is Good  Follow up: 1. Dr. Kellie Simmering in 3 weeks   Leontine Locket, PA-C [facility] [phone] 10/13/2012  12:35 PM

## 2012-10-13 NOTE — Progress Notes (Signed)
PT Cancellation Note  Patient Details Name: [name] MRN: [id number] DOB: [birth year]   Cancelled Treatment:    Reason Eval/Treat Not Completed: Patient at procedure or test/unavailable. Pt at HD this AM and then had just amb to bathroom and back with nursing.   Kaislee Chao 10/13/2012, 2:14 PM

## 2012-10-13 NOTE — Progress Notes (Addendum)
[facility] Progress Note  10/13/2012 7:28 AM POD 4  Subjective:  "I don't wanna walk, I'll walk when I get home"  Tm 99.1 now afebrile Filed Vitals:   10/13/12 0402  BP: 135/71  Pulse: 79  Temp: 98.6 F (37 C)  Resp: 20    Physical Exam: Incisions:  All c/d/i Extremities:  Left foot is warm with palpable DP pulse.  CBC    Component Value Date/Time   WBC 7.5 10/10/2012 0711   WBC 6.2 11/06/2006 0920   RBC 2.98* 10/10/2012 0711   RBC 4.25 11/06/2006 0920   HGB 9.3* 10/10/2012 0711   HGB 12.2 11/06/2006 0920   HCT 28.1* 10/10/2012 0711   HCT 37.7 11/06/2006 0920   PLT 188 10/10/2012 0711   PLT 375 11/06/2006 0920   MCV 94.3 10/10/2012 0711   MCV 88.7 11/06/2006 0920   MCH 31.2 10/10/2012 0711   MCH 28.7 11/06/2006 0920   MCHC 33.1 10/10/2012 0711   MCHC 32.4 11/06/2006 0920   RDW 12.5 10/10/2012 0711   RDW 15.5* 11/06/2006 0920   LYMPHSABS 1.7 06/18/2011 0533   LYMPHSABS 1.5 11/06/2006 0920   MONOABS 1.0 06/18/2011 0533   MONOABS 0.5 11/06/2006 0920   EOSABS 0.8* 06/18/2011 0533   EOSABS 0.2 11/06/2006 0920   BASOSABS 0.1 06/18/2011 0533   BASOSABS 0.0 11/06/2006 0920    BMET    Component Value Date/Time   NA 133* 10/10/2012 0711   K 4.3 10/10/2012 0711   CL 94* 10/10/2012 0711   CO2 26 10/10/2012 0711   GLUCOSE 146* 10/10/2012 0711   BUN 26* 10/10/2012 0711   CREATININE 7.46* 10/10/2012 0711   CALCIUM 9.3 10/10/2012 0711   GFRNONAA 5* 10/10/2012 0711   GFRAA 5* 10/10/2012 0711    INR    Component Value Date/Time   INR 1.14 10/07/2012 1348     Intake/Output Summary (Last 24 hours) at 10/13/12 0728 Last data filed at 10/12/12 1815  Gross per 24 hour  Intake    180 ml  Output      0 ml  Net    180 ml     Assessment/Plan:  78 y.o. female is s/p  Redo left external iliac to tibioperoneal trunk bypass using 6 mm Gore-Tex-propaten  Endarterectomy of tibioperoneal trunk and origin of anterior tibial artery  Intraoperative arteriogram left  leg   POD 4  -pt for HD today -pt has not ambulated very much-needs to mobilize more. -home when mobilizing more.   Leontine Locket, PA-C [facility] [phone] 10/13/2012 7:28 AM   Agree with above Surgical incisions look good healing nicely with no distal edema and 2+ dorsalis pedis pulse palpable left foot Left foot well perfused and pink  Please patient is ready to be discharged today post hemodialysis and will increase ambulation at home We'll see in office in followup in 2 weeks

## 2012-10-13 NOTE — Telephone Encounter (Signed)
Lm and sent letter, dpm

## 2012-10-13 NOTE — Procedures (Signed)
I have seen and examined this patient and agree with the plan of care , seen on dialysis  [facility] 10/13/2012, 7:46 AM

## 2012-10-14 ENCOUNTER — Telehealth: Payer: Self-pay | Admitting: Vascular Surgery

## 2012-10-14 ENCOUNTER — Telehealth: Payer: Self-pay | Admitting: *Deleted

## 2012-10-14 NOTE — Telephone Encounter (Signed)
Rosa called regarding Mrs. Heid taking antibiotics post op.   I asked Dr. Kellie Simmering and he said that antibiotics were not needed at this time. I left a message regarding this on Rosa's phone [phone]. I left instructions for her to call us back if Mrs. Postlewaite has any fever, redness, drainage or increase in pain.

## 2012-10-14 NOTE — Telephone Encounter (Signed)
Patient does not need to continue Cipro following discharge from hospital yesterday

## 2012-10-21 ENCOUNTER — Ambulatory Visit: Payer: Medicare Other | Admitting: Vascular Surgery

## 2012-10-27 ENCOUNTER — Encounter: Payer: Self-pay | Admitting: Vascular Surgery

## 2012-10-28 ENCOUNTER — Ambulatory Visit: Payer: Medicare Other | Admitting: Vascular Surgery

## 2012-11-03 ENCOUNTER — Encounter: Payer: Self-pay | Admitting: Vascular Surgery

## 2012-11-04 ENCOUNTER — Encounter: Payer: Self-pay | Admitting: Vascular Surgery

## 2012-11-04 ENCOUNTER — Ambulatory Visit (INDEPENDENT_AMBULATORY_CARE_PROVIDER_SITE_OTHER): Payer: Medicare Other | Admitting: Vascular Surgery

## 2012-11-04 VITALS — BP 137/73 | HR 58 | Resp 18 | Ht <= 58 in | Wt 135.0 lb

## 2012-11-04 DIAGNOSIS — I739 Peripheral vascular disease, unspecified: Secondary | ICD-10-CM

## 2012-11-04 DIAGNOSIS — Z48812 Encounter for surgical aftercare following surgery on the circulatory system: Secondary | ICD-10-CM

## 2012-11-04 NOTE — Addendum Note (Signed)
Addended by: Mena Goes on: 11/04/2012 11:35 AM   Modules accepted: Orders

## 2012-11-04 NOTE — Progress Notes (Signed)
Subjective:     Patient ID: [name], female   DOB: 1934-09-24, 78 y.o.   MRN: [id number]  HPI this 78 year old female returns 3 weeks post a redo left external iliac to tibio peroneal trunk bypass with 6 mm Gore-Tex with endarterectomy of the origin of the anterior tibial artery. She has done well. She is ambulating without difficulty. She has some mild discomfort in the hand and knee related to the popliteal wound. She has no rest pain.   Review of Systems     Objective:   Physical ExamBP 137/73  Pulse 58  Resp 18  Ht 4\' 10"  (1.473 m)  Wt 135 lb (61.236 kg)  BMI 28.22 kg/m2  General well-developed well-nourished elderly female in no apparent distress alert and oriented x3 Left lower extremity with well-healed inguinal and popliteal wound. 3+ dorsalis pedis pulse palpable. Foot well-perfused.     Assessment:     Nicely functioning redo left external iliac to popliteal bypass with 3+ dorsalis pedis pulse palpable-on aspirin therapy    Plan:     Return in 3 months for duplex scan of bypass and ABIs and to see nurse practitioner

## 2012-11-10 ENCOUNTER — Emergency Department (HOSPITAL_COMMUNITY): Payer: Medicare Other

## 2012-11-10 ENCOUNTER — Emergency Department (HOSPITAL_COMMUNITY)
Admission: EM | Admit: 2012-11-10 | Discharge: 2012-11-10 | Disposition: A | Discharge status: Home / Self Care | Payer: Medicare Other | Attending: Emergency Medicine | Admitting: Emergency Medicine

## 2012-11-10 ENCOUNTER — Encounter (HOSPITAL_COMMUNITY): Payer: Self-pay | Admitting: Emergency Medicine

## 2012-11-10 DIAGNOSIS — Y939 Activity, unspecified: Secondary | ICD-10-CM | POA: Insufficient documentation

## 2012-11-10 DIAGNOSIS — K219 Gastro-esophageal reflux disease without esophagitis: Secondary | ICD-10-CM | POA: Insufficient documentation

## 2012-11-10 DIAGNOSIS — I359 Nonrheumatic aortic valve disorder, unspecified: Secondary | ICD-10-CM | POA: Insufficient documentation

## 2012-11-10 DIAGNOSIS — Y929 Unspecified place or not applicable: Secondary | ICD-10-CM | POA: Insufficient documentation

## 2012-11-10 DIAGNOSIS — S91309A Unspecified open wound, unspecified foot, initial encounter: Secondary | ICD-10-CM | POA: Insufficient documentation

## 2012-11-10 DIAGNOSIS — X58XXXA Exposure to other specified factors, initial encounter: Secondary | ICD-10-CM | POA: Insufficient documentation

## 2012-11-10 DIAGNOSIS — Z8639 Personal history of other endocrine, nutritional and metabolic disease: Secondary | ICD-10-CM | POA: Insufficient documentation

## 2012-11-10 DIAGNOSIS — I10 Essential (primary) hypertension: Secondary | ICD-10-CM | POA: Insufficient documentation

## 2012-11-10 DIAGNOSIS — S32000A Wedge compression fracture of unspecified lumbar vertebra, initial encounter for closed fracture: Secondary | ICD-10-CM

## 2012-11-10 DIAGNOSIS — M545 Low back pain: Secondary | ICD-10-CM

## 2012-11-10 DIAGNOSIS — Z79899 Other long term (current) drug therapy: Secondary | ICD-10-CM | POA: Insufficient documentation

## 2012-11-10 DIAGNOSIS — Z7982 Long term (current) use of aspirin: Secondary | ICD-10-CM | POA: Insufficient documentation

## 2012-11-10 DIAGNOSIS — E119 Type 2 diabetes mellitus without complications: Secondary | ICD-10-CM | POA: Insufficient documentation

## 2012-11-10 DIAGNOSIS — S32009A Unspecified fracture of unspecified lumbar vertebra, initial encounter for closed fracture: Secondary | ICD-10-CM | POA: Insufficient documentation

## 2012-11-10 DIAGNOSIS — Z8739 Personal history of other diseases of the musculoskeletal system and connective tissue: Secondary | ICD-10-CM | POA: Insufficient documentation

## 2012-11-10 DIAGNOSIS — Z8781 Personal history of (healed) traumatic fracture: Secondary | ICD-10-CM | POA: Insufficient documentation

## 2012-11-10 DIAGNOSIS — I479 Paroxysmal tachycardia, unspecified: Secondary | ICD-10-CM | POA: Insufficient documentation

## 2012-11-10 DIAGNOSIS — I96 Gangrene, not elsewhere classified: Secondary | ICD-10-CM | POA: Insufficient documentation

## 2012-11-10 DIAGNOSIS — Z87891 Personal history of nicotine dependence: Secondary | ICD-10-CM | POA: Insufficient documentation

## 2012-11-10 DIAGNOSIS — Z8701 Personal history of pneumonia (recurrent): Secondary | ICD-10-CM | POA: Insufficient documentation

## 2012-11-10 DIAGNOSIS — Z862 Personal history of diseases of the blood and blood-forming organs and certain disorders involving the immune mechanism: Secondary | ICD-10-CM | POA: Insufficient documentation

## 2012-11-10 DIAGNOSIS — J45909 Unspecified asthma, uncomplicated: Secondary | ICD-10-CM | POA: Insufficient documentation

## 2012-11-10 DIAGNOSIS — Z5189 Encounter for other specified aftercare: Secondary | ICD-10-CM | POA: Insufficient documentation

## 2012-11-10 DIAGNOSIS — N186 End stage renal disease: Secondary | ICD-10-CM | POA: Insufficient documentation

## 2012-11-10 MED ORDER — OXYCODONE-ACETAMINOPHEN 5-325 MG PO TABS: 1.0000 | ORAL_TABLET | Freq: Four times a day (QID) | ORAL | Status: DC | PRN

## 2012-11-10 MED ORDER — DIAZEPAM 5 MG/ML IJ SOLN: 2.5000 mg | Freq: Once | INTRAMUSCULAR | Status: DC

## 2012-11-10 MED ORDER — OXYCODONE-ACETAMINOPHEN 5-325 MG PO TABS
1.0000 | ORAL_TABLET | Freq: Once | ORAL | Status: AC
  Administered 2012-11-10: 1 via ORAL
  Filled 2012-11-10: qty 1
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
## 2012-11-10 NOTE — ED Notes (Signed)
Pain started 2 days ago. HD MWF. Pain denies injury, pain when urinating, or using bathroom. Denies recent falls.

## 2012-11-10 NOTE — Progress Notes (Signed)
Orthopedic Tech Progress Note Patient Details:  [name] 1934/11/15 [id number]  Ortho Devices Type of Ortho Device: Postop boot   Katheren Shams 11/10/2012, 6:27 AM

## 2012-11-10 NOTE — ED Notes (Addendum)
Cleaned wound on bottom of left foot. Bacitracin put on wound, placed telfa over wound and wrapped with gauze. Ortho called for post op shoe.

## 2012-11-10 NOTE — ED Notes (Addendum)
Pt states that she has used ibuprofen and oxycodone for pain but has had no relief. Pain started two days ago but became unbearable this morning and was unable to get up do to the pain.

## 2012-11-21 ENCOUNTER — Other Ambulatory Visit: Payer: Self-pay | Admitting: Neurological Surgery

## 2012-11-21 DIAGNOSIS — M8440XA Pathological fracture, unspecified site, initial encounter for fracture: Secondary | ICD-10-CM

## 2012-11-24 ENCOUNTER — Other Ambulatory Visit: Payer: Medicare Other

## 2012-11-24 ENCOUNTER — Emergency Department (HOSPITAL_COMMUNITY): Payer: Medicare Other

## 2012-11-24 ENCOUNTER — Other Ambulatory Visit: Payer: Self-pay | Admitting: Neurological Surgery

## 2012-11-24 ENCOUNTER — Emergency Department (HOSPITAL_COMMUNITY)
Admission: EM | Admit: 2012-11-24 | Discharge: 2012-11-24 | Disposition: A | Discharge status: Home / Self Care | Payer: Medicare Other | Attending: Emergency Medicine | Admitting: Emergency Medicine

## 2012-11-24 ENCOUNTER — Encounter (HOSPITAL_COMMUNITY): Payer: Self-pay

## 2012-11-24 ENCOUNTER — Encounter (HOSPITAL_COMMUNITY): Payer: Self-pay | Admitting: Pharmacy Technician

## 2012-11-24 ENCOUNTER — Encounter (HOSPITAL_COMMUNITY): Payer: Self-pay | Admitting: *Deleted

## 2012-11-24 DIAGNOSIS — G8929 Other chronic pain: Secondary | ICD-10-CM | POA: Insufficient documentation

## 2012-11-24 DIAGNOSIS — Z8701 Personal history of pneumonia (recurrent): Secondary | ICD-10-CM | POA: Insufficient documentation

## 2012-11-24 DIAGNOSIS — I050 Rheumatic mitral stenosis: Secondary | ICD-10-CM | POA: Insufficient documentation

## 2012-11-24 DIAGNOSIS — Z8781 Personal history of (healed) traumatic fracture: Secondary | ICD-10-CM | POA: Insufficient documentation

## 2012-11-24 DIAGNOSIS — N186 End stage renal disease: Secondary | ICD-10-CM | POA: Insufficient documentation

## 2012-11-24 DIAGNOSIS — J45909 Unspecified asthma, uncomplicated: Secondary | ICD-10-CM | POA: Insufficient documentation

## 2012-11-24 DIAGNOSIS — M79606 Pain in leg, unspecified: Secondary | ICD-10-CM

## 2012-11-24 DIAGNOSIS — I739 Peripheral vascular disease, unspecified: Secondary | ICD-10-CM | POA: Insufficient documentation

## 2012-11-24 DIAGNOSIS — D649 Anemia, unspecified: Secondary | ICD-10-CM | POA: Insufficient documentation

## 2012-11-24 DIAGNOSIS — Y9389 Activity, other specified: Secondary | ICD-10-CM | POA: Insufficient documentation

## 2012-11-24 DIAGNOSIS — E119 Type 2 diabetes mellitus without complications: Secondary | ICD-10-CM | POA: Insufficient documentation

## 2012-11-24 DIAGNOSIS — I120 Hypertensive chronic kidney disease with stage 5 chronic kidney disease or end stage renal disease: Secondary | ICD-10-CM | POA: Insufficient documentation

## 2012-11-24 DIAGNOSIS — W010XXA Fall on same level from slipping, tripping and stumbling without subsequent striking against object, initial encounter: Secondary | ICD-10-CM | POA: Insufficient documentation

## 2012-11-24 DIAGNOSIS — S32050A Wedge compression fracture of fifth lumbar vertebra, initial encounter for closed fracture: Secondary | ICD-10-CM

## 2012-11-24 DIAGNOSIS — E785 Hyperlipidemia, unspecified: Secondary | ICD-10-CM | POA: Insufficient documentation

## 2012-11-24 DIAGNOSIS — M549 Dorsalgia, unspecified: Secondary | ICD-10-CM | POA: Insufficient documentation

## 2012-11-24 DIAGNOSIS — M129 Arthropathy, unspecified: Secondary | ICD-10-CM | POA: Insufficient documentation

## 2012-11-24 DIAGNOSIS — I498 Other specified cardiac arrhythmias: Secondary | ICD-10-CM | POA: Insufficient documentation

## 2012-11-24 DIAGNOSIS — M109 Gout, unspecified: Secondary | ICD-10-CM | POA: Insufficient documentation

## 2012-11-24 DIAGNOSIS — K219 Gastro-esophageal reflux disease without esophagitis: Secondary | ICD-10-CM | POA: Insufficient documentation

## 2012-11-24 DIAGNOSIS — S32009A Unspecified fracture of unspecified lumbar vertebra, initial encounter for closed fracture: Secondary | ICD-10-CM | POA: Insufficient documentation

## 2012-11-24 DIAGNOSIS — Y929 Unspecified place or not applicable: Secondary | ICD-10-CM | POA: Insufficient documentation

## 2012-11-24 DIAGNOSIS — Z87891 Personal history of nicotine dependence: Secondary | ICD-10-CM | POA: Insufficient documentation

## 2012-11-24 DIAGNOSIS — E875 Hyperkalemia: Secondary | ICD-10-CM | POA: Insufficient documentation

## 2012-11-24 DIAGNOSIS — Z79899 Other long term (current) drug therapy: Secondary | ICD-10-CM | POA: Insufficient documentation

## 2012-11-24 DIAGNOSIS — Z7982 Long term (current) use of aspirin: Secondary | ICD-10-CM | POA: Insufficient documentation

## 2012-11-24 MED ORDER — OXYCODONE-ACETAMINOPHEN 5-325 MG PO TABS
2.0000 | ORAL_TABLET | Freq: Once | ORAL | Status: AC
  Administered 2012-11-24: 2 via ORAL
  Filled 2012-11-24: qty 2
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
## 2012-11-24 NOTE — ED Notes (Signed)
Patient transported to X-ray 

## 2012-11-24 NOTE — Discharge Instructions (Signed)
Your x-ray of your leg today was normal. Your back x-ray showing a new compression fracture at L5. It is important for you to discuss this with Dr. Ellene Route. It is also important for you to get your dialysis on Wednesday. Return to the emergency department with worsening symptoms. Back, Compression Fracture A compression fracture happens when a force is put upon the length of your spine. Slipping and falling on your bottom are examples of such a force. When this happens, sometimes the force is great enough to compress the building blocks (vertebral bodies) of your spine. Although this causes a lot of pain, this can usually be treated at home, unless your caregiver feels hospitalization is needed for pain control. Your backbone (spinal column) is made up of 24 main vertebral bodies in addition to the sacrum and coccyx (see illustration). These are held together by tough fibrous tissues (ligaments) and by support of your muscles. Nerve roots pass through the openings between the vertebrae. A sudden wrenching move, injury, or a fall may cause a compression fracture of one of the vertebral bodies. This may result in back pain or spread of pain into the belly (abdomen), the buttocks, and down the leg into the foot. Pain may also be created by muscle spasm alone. Large studies have been undertaken to determine the best possible course of action to help your back following injury and also to prevent future problems. The recommendations are as follows. FOLLOWING A COMPRESSION FRACTURE: Do the following only if advised by your caregiver.   If a back brace has been suggested or provided, wear it as directed.  DO NOT stop wearing the back brace unless instructed by your caregiver.  When allowed to return to regular activities, avoid a sedentary life style. Actively exercise. Sporadic weekend binges of tennis, racquetball, water skiing, may actually aggravate or create problems, especially if you are not in condition  for that activity.  Avoid sports requiring sudden body movements until you are in condition for them. Swimming and walking are safer activities.  Maintain good posture.  Avoid obesity.  If not already done, you should have a DEXA scan. Based on the results, be treated for osteoporosis. FOLLOWING ACUTE (SUDDEN) INJURY:  Only take over-the-counter or prescription medicines for pain, discomfort, or fever as directed by your caregiver.  Use bed rest for only the most extreme acute episode. Prolonged bed rest may aggravate your condition. Ice used for acute conditions is effective. Use a large plastic bag filled with ice. Wrap it in a towel. This also provides excellent pain relief. This may be continuous. Or use it for 30 minutes every 2 hours during acute phase, then as needed. Heat for 30 minutes prior to activities is helpful.  As soon as the acute phase (the time when your back is too painful for you to do normal activities) is over, it is important to resume normal activities and work [employer] information centre manager. Back injuries can cause potentially marked changes in lifestyle. So it is important to attack these problems aggressively.  See your caregiver for continued problems. He or she can help or refer you for appropriate exercises, physical therapy and work hardening if needed.  If you are given narcotic medications for your condition, for the next 24 hours DO NOT:  Drive  Operate machinery or power tools.  Sign legal documents.  DO NOT drink alcohol, take sleeping pills or other medications that may interfere with treatment. If your caregiver has given you a follow-up appointment, it  is very important to keep that appointment. Not keeping the appointment could result in a chronic or permanent injury, pain, and disability. If there is any problem keeping the appointment, you must call back to this facility for assistance.  SEEK IMMEDIATE MEDICAL CARE IF:  You develop numbness, tingling,  weakness, or problems with the use of your arms or legs.  You develop severe back pain not relieved with medications.  You have changes in bowel or bladder control.  You have increasing pain in any areas of the body. Document Released: 12/17/2005 Document Revised: 03/10/2012 Document Reviewed: 07/21/2008 [facility] Patient Information 2013 [place].

## 2012-11-24 NOTE — ED Notes (Addendum)
EMS reports pt was to go to dialysis today, getting up into wheelchair and legs gave out. Yesterday attenpted to get in [facility] and it slid out from under her, has 2 fx vertebra for 3 weeks

## 2012-11-25 ENCOUNTER — Encounter (HOSPITAL_COMMUNITY): Payer: Self-pay | Admitting: Anesthesiology

## 2012-11-25 ENCOUNTER — Ambulatory Visit (HOSPITAL_COMMUNITY): Payer: Medicare Other | Admitting: Anesthesiology

## 2012-11-25 ENCOUNTER — Encounter (HOSPITAL_COMMUNITY): Payer: Self-pay | Admitting: Surgery

## 2012-11-25 ENCOUNTER — Observation Stay (HOSPITAL_COMMUNITY): Payer: Medicare Other

## 2012-11-25 ENCOUNTER — Ambulatory Visit (HOSPITAL_COMMUNITY): Payer: Medicare Other

## 2012-11-25 ENCOUNTER — Observation Stay (HOSPITAL_COMMUNITY)
Admission: RE | Admit: 2012-11-25 | Discharge: 2012-11-27 | Disposition: A | Discharge status: Home / Self Care | Payer: Medicare Other | Source: Ambulatory Visit | Attending: Neurological Surgery | Admitting: Neurological Surgery

## 2012-11-25 ENCOUNTER — Encounter (HOSPITAL_COMMUNITY)
Admission: RE | Disposition: A | Discharge status: Home / Self Care | Payer: Self-pay | Source: Ambulatory Visit | Attending: Neurological Surgery

## 2012-11-25 DIAGNOSIS — R079 Chest pain, unspecified: Secondary | ICD-10-CM

## 2012-11-25 DIAGNOSIS — Z992 Dependence on renal dialysis: Secondary | ICD-10-CM | POA: Insufficient documentation

## 2012-11-25 DIAGNOSIS — I70219 Atherosclerosis of native arteries of extremities with intermittent claudication, unspecified extremity: Secondary | ICD-10-CM

## 2012-11-25 DIAGNOSIS — I739 Peripheral vascular disease, unspecified: Secondary | ICD-10-CM | POA: Diagnosis present

## 2012-11-25 DIAGNOSIS — S32009A Unspecified fracture of unspecified lumbar vertebra, initial encounter for closed fracture: Secondary | ICD-10-CM

## 2012-11-25 DIAGNOSIS — I10 Essential (primary) hypertension: Secondary | ICD-10-CM

## 2012-11-25 DIAGNOSIS — M8448XA Pathological fracture, other site, initial encounter for fracture: Principal | ICD-10-CM | POA: Insufficient documentation

## 2012-11-25 DIAGNOSIS — E1122 Type 2 diabetes mellitus with diabetic chronic kidney disease: Secondary | ICD-10-CM | POA: Diagnosis present

## 2012-11-25 DIAGNOSIS — M109 Gout, unspecified: Secondary | ICD-10-CM | POA: Diagnosis present

## 2012-11-25 DIAGNOSIS — Z9889 Other specified postprocedural states: Secondary | ICD-10-CM

## 2012-11-25 DIAGNOSIS — J4489 Other specified chronic obstructive pulmonary disease: Secondary | ICD-10-CM

## 2012-11-25 DIAGNOSIS — N186 End stage renal disease: Secondary | ICD-10-CM | POA: Diagnosis present

## 2012-11-25 DIAGNOSIS — I120 Hypertensive chronic kidney disease with stage 5 chronic kidney disease or end stage renal disease: Secondary | ICD-10-CM | POA: Insufficient documentation

## 2012-11-25 DIAGNOSIS — J449 Chronic obstructive pulmonary disease, unspecified: Secondary | ICD-10-CM

## 2012-11-25 DIAGNOSIS — K219 Gastro-esophageal reflux disease without esophagitis: Secondary | ICD-10-CM | POA: Insufficient documentation

## 2012-11-25 DIAGNOSIS — Z48812 Encounter for surgical aftercare following surgery on the circulatory system: Secondary | ICD-10-CM

## 2012-11-25 DIAGNOSIS — I70269 Atherosclerosis of native arteries of extremities with gangrene, unspecified extremity: Secondary | ICD-10-CM | POA: Diagnosis present

## 2012-11-25 DIAGNOSIS — E119 Type 2 diabetes mellitus without complications: Secondary | ICD-10-CM | POA: Insufficient documentation

## 2012-11-25 DIAGNOSIS — I059 Rheumatic mitral valve disease, unspecified: Secondary | ICD-10-CM | POA: Diagnosis present

## 2012-11-25 HISTORY — PX: KYPHOPLASTY: SHX5884

## 2012-11-25 HISTORY — DX: Dependence on renal dialysis: Z99.2

## 2012-11-25 HISTORY — DX: Dependence on renal dialysis: N18.6

## 2012-11-25 HISTORY — DX: Type 2 diabetes mellitus without complications: E11.9

## 2012-11-25 LAB — GLUCOSE, CAPILLARY
Glucose-Capillary: 75 mg/dL (ref 70–99)
Glucose-Capillary: 74 mg/dL (ref 70–99)
Glucose-Capillary: 89 mg/dL (ref 70–99)

## 2012-11-25 LAB — POCT I-STAT 4, (NA,K, GLUC, HGB,HCT)
Glucose, Bld: 104 mg/dL — ABNORMAL HIGH (ref 70–99)
HCT: 35 % — ABNORMAL LOW (ref 36.0–46.0)
Hemoglobin: 11.9 g/dL — ABNORMAL LOW (ref 12.0–15.0)
Potassium: 3.8 mEq/L (ref 3.5–5.1)
Sodium: 134 mEq/L — ABNORMAL LOW (ref 135–145)

## 2012-11-25 SURGERY — KYPHOPLASTY
Anesthesia: General | Wound class: Clean

## 2012-11-25 MED ORDER — MUPIROCIN 2 % EX OINT
TOPICAL_OINTMENT | CUTANEOUS | Status: AC
  Administered 2012-11-25: 1
  Filled 2012-11-25: qty 22

## 2012-11-25 MED ORDER — CEFAZOLIN SODIUM-DEXTROSE 2-3 GM-% IV SOLR
INTRAVENOUS | Status: AC
  Administered 2012-11-25: 2 g via INTRAVENOUS
  Filled 2012-11-25: qty 50

## 2012-11-25 MED ORDER — IOHEXOL 300 MG/ML  SOLN
INTRAMUSCULAR | Status: DC | PRN
  Administered 2012-11-25: 300 mg via INTRAVENOUS

## 2012-11-25 MED ORDER — ACETAMINOPHEN 650 MG RE SUPP: 650.0000 mg | RECTAL | Status: DC | PRN

## 2012-11-25 MED ORDER — LIDOCAINE HCL (PF) 1 % IJ SOLN: 5.0000 mL | INTRAMUSCULAR | Status: DC | PRN

## 2012-11-25 MED ORDER — NEPRO/CARBSTEADY PO LIQD
237.0000 mL | Freq: Three times a day (TID) | ORAL | Status: DC | PRN
  Filled 2012-11-25: qty 237

## 2012-11-25 MED ORDER — TRAMADOL HCL 50 MG PO TABS: 50.0000 mg | ORAL_TABLET | Freq: Four times a day (QID) | ORAL | Status: DC | PRN

## 2012-11-25 MED ORDER — ALBUTEROL SULFATE (5 MG/ML) 0.5% IN NEBU: 2.5000 mg | INHALATION_SOLUTION | Freq: Four times a day (QID) | RESPIRATORY_TRACT | Status: DC | PRN

## 2012-11-25 MED ORDER — HYDRALAZINE HCL 20 MG/ML IJ SOLN
10.0000 mg | Freq: Four times a day (QID) | INTRAMUSCULAR | Status: DC | PRN
  Administered 2012-11-25 – 2012-11-26 (×2): 10 mg via INTRAVENOUS
  Filled 2012-11-25 (×2): qty 0.5

## 2012-11-25 MED ORDER — FENTANYL CITRATE 0.05 MG/ML IJ SOLN
INTRAMUSCULAR | Status: AC
  Filled 2012-11-25: qty 2

## 2012-11-25 MED ORDER — ONDANSETRON HCL 4 MG/2ML IJ SOLN: 4.0000 mg | INTRAMUSCULAR | Status: DC | PRN

## 2012-11-25 MED ORDER — METOPROLOL SUCCINATE ER 50 MG PO TB24
50.0000 mg | ORAL_TABLET | Freq: Every day | ORAL | Status: DC
  Administered 2012-11-27: 50 mg via ORAL
  Filled 2012-11-25 (×3): qty 1

## 2012-11-25 MED ORDER — SORBITOL 70 % PO SOLN
30.0000 mL | Freq: Every day | ORAL | Status: DC | PRN
  Filled 2012-11-25: qty 30

## 2012-11-25 MED ORDER — METOCLOPRAMIDE HCL 5 MG/ML IJ SOLN: 10.0000 mg | Freq: Once | INTRAMUSCULAR | Status: DC | PRN

## 2012-11-25 MED ORDER — NEOSTIGMINE METHYLSULFATE 1 MG/ML IJ SOLN
INTRAMUSCULAR | Status: DC | PRN
  Administered 2012-11-25: 3 mg via INTRAVENOUS

## 2012-11-25 MED ORDER — ALUM & MAG HYDROXIDE-SIMETH 200-200-20 MG/5ML PO SUSP: 30.0000 mL | Freq: Four times a day (QID) | ORAL | Status: DC | PRN

## 2012-11-25 MED ORDER — FENTANYL CITRATE 0.05 MG/ML IJ SOLN
INTRAMUSCULAR | Status: DC | PRN
  Administered 2012-11-25 (×2): 25 ug via INTRAVENOUS
  Administered 2012-11-25: 50 ug via INTRAVENOUS

## 2012-11-25 MED ORDER — HYDRALAZINE HCL 20 MG/ML IJ SOLN
INTRAMUSCULAR | Status: AC
  Administered 2012-11-26: 10 mg via INTRAVENOUS
  Filled 2012-11-25: qty 1

## 2012-11-25 MED ORDER — ACETAMINOPHEN 325 MG PO TABS
650.0000 mg | ORAL_TABLET | ORAL | Status: DC | PRN
  Administered 2012-11-26: 650 mg via ORAL

## 2012-11-25 MED ORDER — SODIUM CHLORIDE 0.9 % IV SOLN
INTRAVENOUS | Status: DC | PRN
  Administered 2012-11-25: 12:00:00 via INTRAVENOUS

## 2012-11-25 MED ORDER — METHOCARBAMOL 500 MG PO TABS: 500.0000 mg | ORAL_TABLET | Freq: Four times a day (QID) | ORAL | Status: DC | PRN

## 2012-11-25 MED ORDER — AMLODIPINE BESYLATE 10 MG PO TABS
10.0000 mg | ORAL_TABLET | Freq: Every day | ORAL | Status: DC
  Administered 2012-11-26 – 2012-11-27 (×2): 10 mg via ORAL
  Filled 2012-11-25 (×4): qty 1

## 2012-11-25 MED ORDER — ASPIRIN EC 81 MG PO TBEC
81.0000 mg | DELAYED_RELEASE_TABLET | Freq: Every day | ORAL | Status: DC
  Administered 2012-11-25 – 2012-11-27 (×3): 81 mg via ORAL
  Filled 2012-11-25 (×4): qty 1

## 2012-11-25 MED ORDER — PENTAFLUOROPROP-TETRAFLUOROETH EX AERO: 1.0000 "application " | INHALATION_SPRAY | CUTANEOUS | Status: DC | PRN

## 2012-11-25 MED ORDER — ROCURONIUM BROMIDE 100 MG/10ML IV SOLN
INTRAVENOUS | Status: DC | PRN
  Administered 2012-11-25: 30 mg via INTRAVENOUS

## 2012-11-25 MED ORDER — OXYCODONE HCL 5 MG PO TABS: 5.0000 mg | ORAL_TABLET | Freq: Once | ORAL | Status: DC | PRN

## 2012-11-25 MED ORDER — ONDANSETRON HCL 4 MG PO TABS
4.0000 mg | ORAL_TABLET | Freq: Four times a day (QID) | ORAL | Status: DC | PRN
  Administered 2012-11-26: 4 mg via ORAL
  Filled 2012-11-25: qty 1

## 2012-11-25 MED ORDER — CEFAZOLIN SODIUM 1-5 GM-% IV SOLN
1.0000 g | Freq: Three times a day (TID) | INTRAVENOUS | Status: AC
  Administered 2012-11-26: 1 g via INTRAVENOUS
  Filled 2012-11-25 (×2): qty 50

## 2012-11-25 MED ORDER — FENTANYL CITRATE 0.05 MG/ML IJ SOLN
25.0000 ug | INTRAMUSCULAR | Status: DC | PRN
  Administered 2012-11-25: 50 ug via INTRAVENOUS
  Administered 2012-11-25 (×2): 25 ug via INTRAVENOUS

## 2012-11-25 MED ORDER — PHENOL 1.4 % MT LIQD: 1.0000 | OROMUCOSAL | Status: DC | PRN

## 2012-11-25 MED ORDER — ACETAMINOPHEN 325 MG PO TABS: 650.0000 mg | ORAL_TABLET | Freq: Four times a day (QID) | ORAL | Status: DC | PRN

## 2012-11-25 MED ORDER — PANTOPRAZOLE SODIUM 40 MG PO TBEC
40.0000 mg | DELAYED_RELEASE_TABLET | Freq: Every day | ORAL | Status: DC
  Administered 2012-11-25 – 2012-11-27 (×3): 40 mg via ORAL
  Filled 2012-11-25 (×3): qty 1

## 2012-11-25 MED ORDER — SORBITOL 70 % SOLN
30.0000 mL | Status: DC | PRN
  Filled 2012-11-25: qty 30

## 2012-11-25 MED ORDER — CAMPHOR-MENTHOL 0.5-0.5 % EX LOTN
1.0000 "application " | TOPICAL_LOTION | Freq: Three times a day (TID) | CUTANEOUS | Status: DC | PRN
  Filled 2012-11-25: qty 222

## 2012-11-25 MED ORDER — MENTHOL 3 MG MT LOZG: 1.0000 | LOZENGE | OROMUCOSAL | Status: DC | PRN

## 2012-11-25 MED ORDER — GLYCOPYRROLATE 0.2 MG/ML IJ SOLN
INTRAMUSCULAR | Status: DC | PRN
  Administered 2012-11-25: 0.2 mg via INTRAVENOUS
  Administered 2012-11-25: 0.4 mg via INTRAVENOUS

## 2012-11-25 MED ORDER — BUPIVACAINE HCL (PF) 0.25 % IJ SOLN
INTRAMUSCULAR | Status: DC | PRN
  Administered 2012-11-25: 4 mL

## 2012-11-25 MED ORDER — ARTIFICIAL TEARS OP OINT
TOPICAL_OINTMENT | OPHTHALMIC | Status: DC | PRN
  Administered 2012-11-25: 1 via OPHTHALMIC

## 2012-11-25 MED ORDER — INFLUENZA VIRUS VACC SPLIT PF IM SUSP
0.5000 mL | INTRAMUSCULAR | Status: AC
  Filled 2012-11-25: qty 0.5

## 2012-11-25 MED ORDER — MUPIROCIN 2 % EX OINT
TOPICAL_OINTMENT | Freq: Two times a day (BID) | CUTANEOUS | Status: DC
  Administered 2012-11-25 – 2012-11-27 (×2): via NASAL
  Filled 2012-11-25: qty 22

## 2012-11-25 MED ORDER — METHOCARBAMOL 100 MG/ML IJ SOLN
500.0000 mg | Freq: Four times a day (QID) | INTRAVENOUS | Status: DC | PRN
  Filled 2012-11-25: qty 5

## 2012-11-25 MED ORDER — DOCUSATE SODIUM 283 MG RE ENEM
1.0000 | ENEMA | RECTAL | Status: DC | PRN
  Filled 2012-11-25: qty 1

## 2012-11-25 MED ORDER — LIDOCAINE-PRILOCAINE 2.5-2.5 % EX CREA
1.0000 "application " | TOPICAL_CREAM | CUTANEOUS | Status: DC | PRN
  Filled 2012-11-25: qty 5

## 2012-11-25 MED ORDER — INSULIN ASPART 100 UNIT/ML ~~LOC~~ SOLN: 0.0000 [IU] | Freq: Three times a day (TID) | SUBCUTANEOUS | Status: DC

## 2012-11-25 MED ORDER — SODIUM CHLORIDE 0.9 % IV SOLN: 100.0000 mL | INTRAVENOUS | Status: DC | PRN

## 2012-11-25 MED ORDER — CLONIDINE HCL 0.2 MG PO TABS
0.2000 mg | ORAL_TABLET | Freq: Two times a day (BID) | ORAL | Status: DC
  Administered 2012-11-26 – 2012-11-27 (×3): 0.2 mg via ORAL
  Filled 2012-11-25 (×8): qty 1

## 2012-11-25 MED ORDER — OXYCODONE-ACETAMINOPHEN 5-325 MG PO TABS
1.0000 | ORAL_TABLET | ORAL | Status: DC | PRN
  Administered 2012-11-26 (×2): 2 via ORAL
  Filled 2012-11-25: qty 2

## 2012-11-25 MED ORDER — SODIUM CHLORIDE 0.9 % IV SOLN: 250.0000 mL | INTRAVENOUS | Status: DC

## 2012-11-25 MED ORDER — ZOLPIDEM TARTRATE 5 MG PO TABS: 5.0000 mg | ORAL_TABLET | Freq: Every evening | ORAL | Status: DC | PRN

## 2012-11-25 MED ORDER — LIDOCAINE HCL (CARDIAC) 20 MG/ML IV SOLN
INTRAVENOUS | Status: DC | PRN
  Administered 2012-11-25: 50 mg via INTRAVENOUS

## 2012-11-25 MED ORDER — HEPARIN SODIUM (PORCINE) 1000 UNIT/ML DIALYSIS
1000.0000 [IU] | INTRAMUSCULAR | Status: DC | PRN
  Filled 2012-11-25: qty 1

## 2012-11-25 MED ORDER — ONDANSETRON HCL 4 MG/2ML IJ SOLN: 4.0000 mg | Freq: Four times a day (QID) | INTRAMUSCULAR | Status: DC | PRN

## 2012-11-25 MED ORDER — PROPOFOL 10 MG/ML IV BOLUS
INTRAVENOUS | Status: DC | PRN
  Administered 2012-11-25: 120 mg via INTRAVENOUS

## 2012-11-25 MED ORDER — CALCIUM ACETATE 667 MG PO CAPS
667.0000 mg | ORAL_CAPSULE | Freq: Three times a day (TID) | ORAL | Status: DC
  Administered 2012-11-25: 667 mg via ORAL
  Filled 2012-11-25 (×9): qty 1

## 2012-11-25 MED ORDER — CALCIUM CARBONATE 1250 MG/5ML PO SUSP
500.0000 mg | Freq: Four times a day (QID) | ORAL | Status: DC | PRN
  Filled 2012-11-25: qty 5

## 2012-11-25 MED ORDER — SODIUM CHLORIDE 0.9 % IJ SOLN: 3.0000 mL | INTRAMUSCULAR | Status: DC | PRN

## 2012-11-25 MED ORDER — HYDROXYZINE HCL 25 MG PO TABS: 25.0000 mg | ORAL_TABLET | Freq: Three times a day (TID) | ORAL | Status: DC | PRN

## 2012-11-25 MED ORDER — OXYCODONE HCL 5 MG/5ML PO SOLN
5.0000 mg | Freq: Once | ORAL | Status: DC | PRN
Start: 2012-11-25 — End: 2012-11-25

## 2012-11-25 MED ORDER — ACETAMINOPHEN 650 MG RE SUPP: 650.0000 mg | Freq: Four times a day (QID) | RECTAL | Status: DC | PRN

## 2012-11-25 MED ORDER — METOPROLOL SUCCINATE ER 50 MG PO TB24
50.0000 mg | ORAL_TABLET | Freq: Once | ORAL | Status: AC
  Administered 2012-11-25: 50 mg via ORAL
  Filled 2012-11-25: qty 1

## 2012-11-25 MED ORDER — MORPHINE SULFATE 2 MG/ML IJ SOLN: 1.0000 mg | INTRAMUSCULAR | Status: DC | PRN

## 2012-11-25 MED ORDER — 0.9 % SODIUM CHLORIDE (POUR BTL) OPTIME
TOPICAL | Status: DC | PRN
  Administered 2012-11-25: 1000 mL

## 2012-11-25 MED ORDER — ONDANSETRON HCL 4 MG/2ML IJ SOLN
INTRAMUSCULAR | Status: DC | PRN
  Administered 2012-11-25: 4 mg via INTRAVENOUS

## 2012-11-25 MED ORDER — SODIUM CHLORIDE 0.9 % IJ SOLN: 3.0000 mL | Freq: Two times a day (BID) | INTRAMUSCULAR | Status: DC

## 2012-11-25 MED ORDER — LIDOCAINE-EPINEPHRINE 1 %-1:100000 IJ SOLN
INTRAMUSCULAR | Status: DC | PRN
  Administered 2012-11-25: 4 mL

## 2012-11-25 MED ORDER — ALTEPLASE 2 MG IJ SOLR
2.0000 mg | Freq: Once | INTRAMUSCULAR | Status: AC | PRN
  Filled 2012-11-25: qty 2

## 2012-11-25 SURGICAL SUPPLY — 48 items
ADH SKN CLS APL DERMABOND .7 (GAUZE/BANDAGES/DRESSINGS) ×1
BANDAGE ADHESIVE 1X3 (GAUZE/BANDAGES/DRESSINGS) ×5 IMPLANT
BLADE SURG 11 STRL SS (BLADE) ×2 IMPLANT
BLADE SURG ROTATE 9660 (MISCELLANEOUS) IMPLANT
CEMENT BONE KYPHX HV R (Orthopedic Implant) ×1 IMPLANT
CEMENT KYPHON C01A KIT/MIXER (Cement) ×1 IMPLANT
CLOTH BEACON ORANGE TIMEOUT ST (SAFETY) ×2 IMPLANT
CONT SPEC 4OZ CLIKSEAL STRL BL (MISCELLANEOUS) ×4 IMPLANT
DECANTER SPIKE VIAL GLASS SM (MISCELLANEOUS) ×2 IMPLANT
DERMABOND ADVANCED (GAUZE/BANDAGES/DRESSINGS) ×1
DERMABOND ADVANCED .7 DNX12 (GAUZE/BANDAGES/DRESSINGS) IMPLANT
DRAPE C-ARM 42X72 X-RAY (DRAPES) ×2 IMPLANT
DRAPE INCISE IOBAN 66X45 STRL (DRAPES) ×2 IMPLANT
DRAPE LAPAROTOMY 100X72X124 (DRAPES) ×2 IMPLANT
DRAPE PROXIMA HALF (DRAPES) ×2 IMPLANT
DURAPREP 26ML APPLICATOR (WOUND CARE) ×2 IMPLANT
GAUZE SPONGE 4X4 16PLY XRAY LF (GAUZE/BANDAGES/DRESSINGS) ×2 IMPLANT
GLOVE BIO SURGEON STRL SZ7.5 (GLOVE) IMPLANT
GLOVE BIOGEL PI IND STRL 7.0 (GLOVE) IMPLANT
GLOVE BIOGEL PI IND STRL 7.5 (GLOVE) IMPLANT
GLOVE BIOGEL PI IND STRL 8.5 (GLOVE) ×1 IMPLANT
GLOVE BIOGEL PI INDICATOR 7.0 (GLOVE) ×1
GLOVE BIOGEL PI INDICATOR 7.5 (GLOVE)
GLOVE BIOGEL PI INDICATOR 8.5 (GLOVE) ×1
GLOVE ECLIPSE 8.5 STRL (GLOVE) ×2 IMPLANT
GLOVE EXAM NITRILE LRG STRL (GLOVE) IMPLANT
GLOVE EXAM NITRILE MD LF STRL (GLOVE) IMPLANT
GLOVE EXAM NITRILE XL STR (GLOVE) IMPLANT
GLOVE EXAM NITRILE XS STR PU (GLOVE) IMPLANT
GLOVE OPTIFIT SS 6.5 STRL BRWN (GLOVE) ×3 IMPLANT
GOWN BRE IMP SLV AUR LG STRL (GOWN DISPOSABLE) ×1 IMPLANT
GOWN BRE IMP SLV AUR XL STRL (GOWN DISPOSABLE) ×2 IMPLANT
GOWN STRL REIN 2XL LVL4 (GOWN DISPOSABLE) ×2 IMPLANT
KIT BASIN OR (CUSTOM PROCEDURE TRAY) ×2 IMPLANT
KIT ROOM TURNOVER OR (KITS) ×2 IMPLANT
NDL HYPO 25X1 1.5 SAFETY (NEEDLE) ×1 IMPLANT
NEEDLE HYPO 25X1 1.5 SAFETY (NEEDLE) ×2 IMPLANT
NS IRRIG 1000ML POUR BTL (IV SOLUTION) ×2 IMPLANT
PACK SURGICAL SETUP 50X90 (CUSTOM PROCEDURE TRAY) ×2 IMPLANT
PAD ARMBOARD 7.5X6 YLW CONV (MISCELLANEOUS) ×6 IMPLANT
SPECIMEN JAR SMALL (MISCELLANEOUS) IMPLANT
SUT VIC AB 3-0 SH 8-18 (SUTURE) ×2 IMPLANT
SUT VIC AB 4-0 P-3 18X BRD (SUTURE) ×1 IMPLANT
SUT VIC AB 4-0 P3 18 (SUTURE) ×2
SYR CONTROL 10ML LL (SYRINGE) ×4 IMPLANT
TOWEL OR 17X24 6PK STRL BLUE (TOWEL DISPOSABLE) ×2 IMPLANT
TOWEL OR 17X26 10 PK STRL BLUE (TOWEL DISPOSABLE) ×2 IMPLANT
TRAY KYPHOPAK 20/3 ONESTEP 1ST (MISCELLANEOUS) ×2 IMPLANT

## 2012-11-25 NOTE — Anesthesia Postprocedure Evaluation (Signed)
Anesthesia Post Note  Patient: [name]  Procedure(s) Performed: Procedure(s) (LRB): KYPHOPLASTY (N/A)  Anesthesia type: General  Patient location: PACU  Post pain: Pain level controlled  Post assessment: Patient's Cardiovascular Status Stable  Last Vitals:  Filed Vitals:   11/25/12 1500  BP: 183/76  Pulse: 57  Temp:   Resp: 10    Post vital signs: Reviewed and stable  Level of consciousness: alert  Complications: No apparent anesthesia complications
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
## 2012-11-25 NOTE — Progress Notes (Signed)
Dr. Albertina Parr notified about BP still up 183/76, no new order given.

## 2012-11-25 NOTE — Progress Notes (Signed)
Nurse called Nira Conn in Neuro OR and informed her that Dr. Ellene Route needed to sign orders for patient. She stated she would let him know.

## 2012-11-25 NOTE — Op Note (Signed)
Date of surgery: 11/25/2012 Preoperative diagnosis: Pathologic compression fractures of L2, L5  Post operative diagnosis: Pathologic compression fractures L2, L5 Procedure: Acrylic balloon kyphoplasty L2, L5 Surgeon: Mallie Mussel Reiner Loewen,MD Indications: Patient is a 78 year old 71 shouldn't with end-stage renal disease who has had severe back pain has been unrelenting for the past several weeks yesterday she was seen in an emergency room were an x-ray confirmed the presence of fractures of L2 and L5 she is taken to the operating room to undergo acrylic balloon kyphoplasty in an effort to control pain.  Procedure: The patient was brought to the operating room supine on a stretcher. After the smooth induction of general endotracheal anesthesia the patient was carefully positioned in the prone position on the operating table with the bony prominences appropriately padded and protected. Overalls were used under the patient's chest back was prepped with alcohol and DuraPrep and draped in a sterile fashion and then biplane fluoroscopy was used to isolate the L5 vertebrae. A pedicle entry site for this her vertebrae was chosen 4 cm lateral to the pedicle at the 9:00 position. A Jamshidi needle was inserted into the pedicle via the transfer radicular transvertebral approach. Biplane fluoroscopy was used during this procedure. Then the cannula was in the vertebral body the inner cannula was removed and a bone drill was used to create a space for the balloon. Balloon was then inflated 150 mmHg and was noted to deteriorate to 60 millimeters of pressure. 4 Cc of dye was injected into the balloon. The med was mixed to appropriate consistency and then injected into the vertebral body under biplane fluoroscopy until complete filling was achieved. A total of 7-1/2 cc of cement was injected.  Then L2 was identified and instrument in a similar fashion here inflation of the balloon yielded much higher pressures with much smaller  volumes. In fact 1 cc of contrast could be injected and the pressure was over 300 mm of mercury. The balloon was deflated and withdrawn and here 3 cc of cement inspissated itself diffusely in the vertebrae mostly along the inferior endplate. At this point the procedure was completed.  Final fluoroscopic images were obtained the Jamshidi needle was removed the incision was closed with a singular 3-0 Vicryl stitch and the patient was then returned to the recovery room in stable condition.

## 2012-11-25 NOTE — Preoperative (Signed)
Beta Blockers   Reason not to administer Beta Blockers:Not Applicable 

## 2012-11-25 NOTE — Progress Notes (Signed)
Spoke with Dr. Vertell Limber regarding 2000 dose of ancef.  Patient has no IV access and is due to go to hemodialysis in 1 hour. He said that they could giver her the 0115 dose PO and not to worry about the 2000 dose she  Missed.  Will continue to monitor patient.

## 2012-11-25 NOTE — H&P (Signed)
   [name]  [id number]  DOB:  1934/04/09    HISTORY:     [name] presents to the office today after being seen in the emergency room on 11/10/2012.  At that time, she was having substantial back pain.  She was seen by the emergency room doctor who did some plain x-rays and demonstrated the presence of an L2 superior endplate compression fracture that has progressed somewhat from a CT scan that was done in September.  [name] notes she has had back pain which is quite severe across the lumbosacral junction.  She points to the area just below her belt line as the area of her most severe pain that has been chronic and unrelenting.    Today in the office to further her workup, I did a plain radiograph in the lateral position.  I am concerned looking at the x-ray that was done on 11/10/12 that there is some compression of the L5 vertebra.  This is more so than comparably noted on the CT scan that was performed in September.  The x-ray today demonstrates again that she has a superior endplate of the L2 vertebra that has shown some compression.  There does, however, also appear to recapitulate some compressive forces on the L5 vertebra.  The alignment of her spine is good in both the coronal and sagittal planes.   However, I am not certain but I am highly suspicious that there is also an L5 compression fracture.  This would certainly be more compatible with her back pain.    PAST MEDICAL HISTORY:   [name] has a history of end-stage renal disease.  She is on hemodialysis.  She is also diabetic.  CURRENT MEDICATIONS:   Baby aspirin, Hyoscyamine, Pantoprazole, Oxycodone, Amlodipine, Benzonatate and Clonidine HCL.  PHYSICAL EXAMINATION:   On exam today, she is walking with assistance and is in considerable discomfort and pain.  There is tenderness across the lumbosacral junction and throughout the lumbar spine to light palpation and percussion.    IMPRESSION:     L2 pathologic compression fracture, suspicion of  L5 pathology compression fracture.   PLAN:      I would like to obtain an MRI of the lumbar spine for [name].  This will confirm whether there is or is not a fracture of the L5 vertebra.  If so, I believe she would be an appropriate candidate for a kyphoplasty of the fractured vertebrae.    [name] was seen in the emergency room yesterday she is to have her MRI yesterday however she will cope with pain that was intolerable and her grandson took her to the emergency department. There the patient underwent an AP and lateral lumbar spine radiograph which demonstrates that there is been progression of the L2 fracture and compression of the L5 vertebrae beyond what we saw in the office confirming that L5 also is an acute fracture. At this time she is to be admitted to undergo acrylic balloon kyphoplasty of both L2 and L5.

## 2012-11-25 NOTE — Transfer of Care (Signed)
Immediate Anesthesia Transfer of Care Note  Patient: [name]  Procedure(s) Performed: Procedure(s) (LRB) with comments: KYPHOPLASTY (N/A) - Lumbar two lumbar five Kyphoplasty  Patient Location: PACU  Anesthesia Type:General  Level of Consciousness: awake, alert  and oriented  Airway & Oxygen Therapy: Patient Spontanous Breathing and Patient connected to face mask oxygen  Post-op Assessment: Report given to PACU RN  Post vital signs: Reviewed and stable  Complications: No apparent anesthesia complications

## 2012-11-25 NOTE — Progress Notes (Signed)
Patient ID: [name], female   DOB: January 28, 1934, 78 y.o.   MRN: [id number] Vital signs stable motor function is intact patient feels more comfortable regarding her back. Have talked to Dr. Adriana Mccallum regarding arranging dialysis for patient. We'll also obtain hospitalist consult to help deal with medical issues. Patient to be admitted until dialysis completed and more medically stable.

## 2012-11-25 NOTE — Anesthesia Preprocedure Evaluation (Signed)
Anesthesia Evaluation  Patient identified by MRN, date of birth, ID band Patient awake    Reviewed: Allergy & Precautions, H&P , NPO status , Patient's Chart, lab work & pertinent test results, reviewed documented beta blocker date and time   History of Anesthesia Complications (+) PONV  Airway Mallampati: II TM Distance: >3 FB Neck ROM: full    Dental   Pulmonary asthma , pneumonia -, resolved, COPD breath sounds clear to auscultation        Cardiovascular hypertension, On Medications and On Home Beta Blockers + Valvular Problems/Murmurs Rhythm:regular     Neuro/Psych negative neurological ROS  negative psych ROS   GI/Hepatic Neg liver ROS, GERD-  Medicated and Controlled,  Endo/Other  diabetes  Renal/GU ESRF and DialysisRenal disease  negative genitourinary   Musculoskeletal   Abdominal   Peds  Hematology negative hematology ROS (+)   Anesthesia Other Findings See surgeon's H&P   Reproductive/Obstetrics negative OB ROS                           Anesthesia Physical Anesthesia Plan  ASA: III  Anesthesia Plan: General   Post-op Pain Management:    Induction: Intravenous  Airway Management Planned: Oral ETT  Additional Equipment:   Intra-op Plan:   Post-operative Plan: Extubation in OR  Informed Consent: I have reviewed the patients History and Physical, chart, labs and discussed the procedure including the risks, benefits and alternatives for the proposed anesthesia with the patient or authorized representative who has indicated his/her understanding and acceptance.   Dental Advisory Given  Plan Discussed with: CRNA and Surgeon  Anesthesia Plan Comments:         Anesthesia Quick Evaluation

## 2012-11-25 NOTE — Consult Note (Signed)
[name] is a 78 y/o female with ESRD on HD at [facility] MWF admitted after undergoing acrylic balloon kyphoplasty of both L2 and L5 by Dr. Ellene Route.  She has not had dialysis since Friday.  She is admitted on this occasion for post operative care and we were asked to assist with dialysis management.  Dialyzes at [facility] on MWF EDW 60KG HD Bath 2K 2.25Ca,  Dialyzer 160, Heparin Tight.Time3&3/4 hrs Access PC right groin.  EPO 1600, Zemplar 57mcg  Past Medical History  Diagnosis Date  . Unspecified essential hypertension   . Hyperpotassemia   . Difficulty in walking   . Gout, unspecified   . Mild mitral valve stenosis   . Arthritis   . Joint pain   . Leg pain   . Thyroid disease   . Gangrene     left fifth toe  . Peripheral vascular disease   . Anemia   . Hyperlipidemia   . Aortic valve sclerosis   . Shoulder fracture, right   . Blood transfusion   . Asthma     hx of  . Pneumonia     hx of  . Bronchitis     hx of  . Type II or unspecified type diabetes mellitus without mention of complication, not stated as uncontrolled     "states not on any medication at this time"  . End stage renal disease     dialysis 02/25/12,  [facility]  . GERD (gastroesophageal reflux disease)     hx of  . Chronic back pain   . Fractured shoulder     hx of  . Other specified cardiac dysrhythmias     sees Dr. Angelena Form  . PONV (postoperative nausea and vomiting)     Past Surgical History  Procedure Date  . Av fistula repair     left  . Av fistula repair     right arm fistula failed  . Total abdominal hysterectomy     one ovary removed  . Arteriovenous graft placement     left femoral loop arteriovenous Gore-Tex graft.  . Av fistula placement   . Amputation 06/22/11    metatarsal amp  . Pr vein bypass graft,aorto-fem-pop 06/14/2011  . Toe amputation 2012    Left 5th toe  . Femoral-popliteal bypass graft 04/11/2012    Procedure: BYPASS GRAFT FEMORAL-POPLITEAL ARTERY;   Surgeon: [name], MD;  Location: [place];  Service: Vascular;  Laterality: Left;  Thrombectomy/Left femoral-popliteal bypass with revision of proximal end and shortening of graft; intraoperative arteriogram; endarterectomy and patch angioplasty with distal anastomosis  . Appendectomy   . Eye surgery     cataract surgery bilateral  . Femoral-popliteal bypass graft 10/09/2012    Procedure: BYPASS GRAFT FEMORAL-POPLITEAL ARTERY;  Surgeon: [name], MD;  Location: [place];  Service: Vascular;  Laterality: Left;  Redo left tibioperoneal trunk bypass with Gortex Graft 34mmx80cm.    Family History  Problem Relation Age of Onset  . Peripheral vascular disease Mother     amputation  . Hypertension Mother   . Diabetes Mother     Social History:  reports that she quit smoking about 23 years ago. Her smoking use included Cigarettes. She has never used smokeless tobacco. She reports that she does not drink alcohol or use illicit drugs.  Allergies:  Allergies  Allergen Reactions  . Ace Inhibitors Other (See Comments)    Reaction unknown    Medications:  Scheduled:   .  amLODipine  10 mg Oral Daily  . aspirin EC  81 mg Oral Daily  . calcium acetate  667 mg Oral TID  . [COMPLETED] ceFAZolin      .  ceFAZolin (ANCEF) IV  1 g Intravenous Q8H  . cloNIDine  0.2 mg Oral BID  . fentaNYL      . hydrALAZINE      . insulin aspart  0-9 Units Subcutaneous TID WC  . [COMPLETED] metoprolol succinate  50 mg Oral Once  . metoprolol succinate  50 mg Oral Daily  . mupirocin ointment   Nasal BID  . [COMPLETED] mupirocin ointment      . pantoprazole  40 mg Oral Daily  . sodium chloride  3 mL Intravenous Q12H     Results for orders placed during the hospital encounter of 11/25/12 (from the past 48 hour(s))  SURGICAL PCR SCREEN     Status: Normal   Collection Time   11/25/12 10:37 AM      Component Value Range Comment   MRSA, PCR NEGATIVE  NEGATIVE    Staphylococcus aureus NEGATIVE  NEGATIVE     POCT I-STAT 4, (NA,K, GLUC, HGB,HCT)     Status: Abnormal   Collection Time   11/25/12 11:09 AM      Component Value Range Comment   Sodium 134 (*) 135 - 145 mEq/L    Potassium 3.8  3.5 - 5.1 mEq/L    Glucose, Bld 104 (*) 70 - 99 mg/dL    HCT 35.0 (*) 36.0 - 46.0 %    Hemoglobin 11.9 (*) 12.0 - 15.0 g/dL   GLUCOSE, CAPILLARY     Status: Normal   Collection Time   11/25/12  2:13 PM      Component Value Range Comment   Glucose-Capillary 75  70 - 99 mg/dL    Comment 1 Notify RN       Dg Lumbar Spine 2-3 Views  11/25/2012  *RADIOLOGY REPORT*  Clinical Data: Lumbar kyphoplasty  DG C-ARM 1-60 MIN,LUMBAR SPINE - 2-3 VIEW  Comparison: 11/24/2012  Findings: Two spot images from intraoperative C-arm fluoroscopy document changes of kyphoplasty L2 and L5.  Left iliac stent is noted.  IMPRESSION:  1.  Kyphoplasty L2 and L5.   Original Report Authenticated By: D. Wallace Going, MD    Dg Lumbar Spine Complete  11/24/2012  *RADIOLOGY REPORT*  Clinical Data: Low back pain  LUMBAR SPINE - COMPLETE 4+ VIEW  Comparison: 11/10/2012  Findings: Five lumbar-type vertebral bodies.  Normal lumbar lordosis.  Stable mild superior endplate compression deformity at L2.  Mild compression deformity at L5, new/increased.  Moderate multilevel degenerative changes.  Vascular calcifications.  Stable right groin dialysis catheter. Left iliac artery stent.  IMPRESSION: Mild compression deformity at L5, new/increased.  Correlate with the site of the patient's pain.  Stable mild superior endplate compression deformity at L2.   Original Report Authenticated By: Julian Hy, M.D.    Dg Tibia/fibula Left  11/24/2012  *RADIOLOGY REPORT*  Clinical Data: Left leg pain  LEFT TIBIA AND FIBULA - 2 VIEW  Comparison: None.  Findings: No fracture or dislocation is seen.  Degenerative changes of the knee and tibiotalar joints.  Vascular calcifications.  IMPRESSION: No fracture or dislocation is seen.   Original Report Authenticated By:  Julian Hy, M.D.    Dg Chest Port 1 View  11/25/2012  *RADIOLOGY REPORT*  Clinical Data: Preop for spine surgery.  PORTABLE CHEST - 1 VIEW  Comparison: 04/11/2012.  Findings: The heart is  borderline enlarged but stable.  There is tortuosity and calcification of the thoracic aorta.  The dialysis catheters are stable.  The lungs are clear.  The bony thorax is intact.  IMPRESSION: No acute cardiopulmonary findings.   Original Report Authenticated By: Marijo Sanes, M.D.    Dg C-arm 1-60 Min  11/25/2012  *RADIOLOGY REPORT*  Clinical Data: Lumbar kyphoplasty  DG C-ARM 1-60 MIN,LUMBAR SPINE - 2-3 VIEW  Comparison: 11/24/2012  Findings: Two spot images from intraoperative C-arm fluoroscopy document changes of kyphoplasty L2 and L5.  Left iliac stent is noted.  IMPRESSION:  1.  Kyphoplasty L2 and L5.   Original Report Authenticated By: D. Hassell III, MD     ROS: Back pain feels better post op  Blood pressure 175/67, pulse 69, temperature 97.6 F (36.4 C), temperature source Oral, resp. rate 16, height 4\' 10"  (1.473 m), weight 61.236 kg (135 lb), SpO2 98.00%. General appearance: alert, cooperative and no distress Head: Normocephalic, without obvious abnormality, atraumatic Eyes: negative Ears: normal TM's and external ear canals both ears and no abnormality Nose: no discharge Throat: lips, mucosa, and tongue normal; teeth and gums normal Resp: clear to auscultation bilaterally Chest wall: no tenderness Cardio: regular rate and rhythm, S1, S2 normal, no murmur, click, rub or gallop GI: soft, non-tender; bowel sounds normal; no masses,  no organomegaly Extremities: extremities normal, atraumatic, no cyanosis or edema and failed aAVGs bilateral Skin: Skin color, texture, turgor normal. No rashes or lesions Neurologic: Grossly normal  Assessment/Plan:  1 ESRD: MWF missed yesterday.  Plan Hemodialysis tonight and Wed  to get back on schedule 2 Hypertension 3. Anemia of ESRD receiving ESA  Rx 4. Metabolic Bone Disease receiving Vita D 5. S/P Kyphoplasty L2 and L5  Camil Hausmann C 11/25/2012, 5:42 PM

## 2012-11-25 NOTE — Progress Notes (Signed)
Report given to dialysis RN and patient transported to hemodialysis.

## 2012-11-26 DIAGNOSIS — S32009A Unspecified fracture of unspecified lumbar vertebra, initial encounter for closed fracture: Secondary | ICD-10-CM

## 2012-11-26 LAB — HEMOGLOBIN A1C: Mean Plasma Glucose: 123 mg/dL — ABNORMAL HIGH (ref <117)

## 2012-11-26 LAB — GLUCOSE, CAPILLARY: Glucose-Capillary: 106 mg/dL — ABNORMAL HIGH (ref 70–99)

## 2012-11-26 MED ORDER — OXYCODONE-ACETAMINOPHEN 5-325 MG PO TABS
ORAL_TABLET | ORAL | Status: AC
  Administered 2012-11-26: 2 via ORAL
  Filled 2012-11-26: qty 2

## 2012-11-26 MED ORDER — ACETAMINOPHEN 325 MG PO TABS
ORAL_TABLET | ORAL | Status: AC
  Filled 2012-11-26: qty 2

## 2012-11-26 MED ORDER — BOOST / RESOURCE BREEZE PO LIQD
1.0000 | Freq: Three times a day (TID) | ORAL | Status: DC
  Administered 2012-11-27: 1 via ORAL

## 2012-11-26 NOTE — Progress Notes (Signed)
Assessment/Plan:  1 ESRD: MWF finished early Wed AM. Plan Hemodialysis Friday.  In patient rehab should be considered as option. 2. Right groin perm cath, needs hybrid AV access Dr. Bridgett Larsson has evaluated 3 Hypertension  4. Anemia of ESRD receiving ESA Rx  5. Metabolic Bone Disease receiving Vita D   Subjective: Interval History: Back is better.  Objective: Vital signs in last 24 hours: Temp:  [96.7 F (35.9 C)-98.5 F (36.9 C)] 98.5 F (36.9 C) (11/27 1400) Pulse Rate:  [52-72] 61  (11/27 1400) Resp:  [9-18] 18  (11/27 1400) BP: (103-214)/(45-163) 167/71 mmHg (11/27 1400) SpO2:  [97 %-100 %] 100 % (11/27 1400) Weight:  [59 kg (130 lb 1.1 oz)-61.3 kg (135 lb 2.3 oz)] 59 kg (130 lb 1.1 oz) (11/27 0430) Weight change:   Intake/Output from previous day: 11/26 0701 - 11/27 0700 In: 590 [P.O.:240; I.V.:350] Out: 2105 [Blood:5] Intake/Output this shift:    General appearance: alert, cooperative and appears stated age Resp: clear to auscultation bilaterally Cardio: regular rate and rhythm, S1, S2 normal, no murmur, click, rub or gallop GI: soft, non-tender; bowel sounds normal; no masses,  no organomegaly Extremities: perm cath right groin, no edema  Lab Results:  Basename 11/25/12 1109  WBC --  HGB 11.9*  HCT 35.0*  PLT --   BMET:  Basename 11/25/12 1109  NA 134*  K 3.8  CL --  CO2 --  GLUCOSE 104*  BUN --  CREATININE --  CALCIUM --   No results found for this basename: PTH:2 in the last 72 hours Iron Studies: No results found for this basename: IRON,TIBC,TRANSFERRIN,FERRITIN in the last 72 hours Studies/Results: Dg Lumbar Spine 2-3 Views  11/25/2012  *RADIOLOGY REPORT*  Clinical Data: Lumbar kyphoplasty  DG C-ARM 1-60 MIN,LUMBAR SPINE - 2-3 VIEW  Comparison: 11/24/2012  Findings: Two spot images from intraoperative C-arm fluoroscopy document changes of kyphoplasty L2 and L5.  Left iliac stent is noted.  IMPRESSION:  1.  Kyphoplasty L2 and L5.   Original Report  Authenticated By: D. Wallace Going, MD    Dg Chest Port 1 View  11/25/2012  *RADIOLOGY REPORT*  Clinical Data: Preop for spine surgery.  PORTABLE CHEST - 1 VIEW  Comparison: 04/11/2012.  Findings: The heart is borderline enlarged but stable.  There is tortuosity and calcification of the thoracic aorta.  The dialysis catheters are stable.  The lungs are clear.  The bony thorax is intact.  IMPRESSION: No acute cardiopulmonary findings.   Original Report Authenticated By: Marijo Sanes, M.D.    Dg C-arm 1-60 Min  11/25/2012  *RADIOLOGY REPORT*  Clinical Data: Lumbar kyphoplasty  DG C-ARM 1-60 MIN,LUMBAR SPINE - 2-3 VIEW  Comparison: 11/24/2012  Findings: Two spot images from intraoperative C-arm fluoroscopy document changes of kyphoplasty L2 and L5.  Left iliac stent is noted.  IMPRESSION:  1.  Kyphoplasty L2 and L5.   Original Report Authenticated By: D. Hassell III, MD     LOS: 1 day   [name] C 11/26/2012,2:20 PM  5. S/P Kyphoplasty L2 and L5

## 2012-11-26 NOTE — Progress Notes (Addendum)
INITIAL ADULT NUTRITION ASSESSMENT Date: 11/26/2012   Time: 12:03 PM Reason for Assessment: MST  INTERVENTION:  Resource Breeze po TID, each supplement provides 250 kcal and 9 grams of protein.   DOCUMENTATION CODES  Per approved criteria   -Not Applicable    ASSESSMENT: Female 78 y.o.  Dx: S/P lumbar spine operation  Hx:  Past Medical History  Diagnosis Date  . Unspecified essential hypertension   . Hyperpotassemia   . Difficulty in walking   . Gout, unspecified 1980's    "not anymore" (11/25/2012)  . Mild mitral valve stenosis   . Joint pain   . Leg pain   . Thyroid disease   . Gangrene     left fifth toe  . Peripheral vascular disease   . Hyperlipidemia   . Aortic valve sclerosis   . Shoulder fracture, right 2012  . Blood transfusion 1960's  . Chronic back pain   . Other specified cardiac dysrhythmias     sees Dr. Angelena Form  . PONV (postoperative nausea and vomiting)   . Heart murmur     "I think so" (11/25/2012)  . Anginal pain   . Asthma     "used to; not anymore" (11/25/2012)  . Pneumonia     "once; years ago" (11/25/2012)  . Chronic bronchitis     "used to keep it; not anymroe" (11/25/2012)  . Exertional dyspnea     "not anymore" (11/25/2012)  . Type II diabetes mellitus     "states not on any medication at this time"  . Anemia   . GERD (gastroesophageal reflux disease)     "not anymore" (11/25/2012)  . Arthritis     "all over" (11/25/2012)  . ESRD (end stage renal disease) on dialysis     M, W[facility] [place] dialysis (11/25/2012)   Past Surgical History  Procedure Date  . Av fistula repair     "right/left arm fistula failed; removed left thigh d/t poor circulation" (11/25/2012)  . Arteriovenous graft placement     left femoral loop arteriovenous Gore-Tex graft.  . Av fistula placement 2008- 2013    "left upper arm; twice in my neck; left leg; removed from left leg; right upper arm" (11/25/2012)  . Foot amputation through metatarsal  06/22/11    "left foot; whole 5th toe" (11/25/2012)  . Pr vein bypass graft,aorto-fem-pop 06/14/2011  . Femoral-popliteal bypass graft 04/11/2012    Procedure: BYPASS GRAFT FEMORAL-POPLITEAL ARTERY;  Surgeon: [name], MD;  Location: [place];  Service: Vascular;  Laterality: Left;  Thrombectomy/Left femoral-popliteal bypass with revision of proximal end and shortening of graft; intraoperative arteriogram; endarterectomy and patch angioplasty with distal anastomosis  . Appendectomy 1960's  . Refractive surgery     "both eyes" (11/25/2012)  . Femoral-popliteal bypass graft 10/09/2012    Procedure: BYPASS GRAFT FEMORAL-POPLITEAL ARTERY;  Surgeon: [name], MD;  Location: [place];  Service: Vascular;  Laterality: Left;  Redo left tibioperoneal trunk bypass with Gortex Graft 57mmx80cm.  . Kyphoplasty 11/25/2012    balloon, L2, L5  . Total abdominal hysterectomy 1960's    one ovary removed    Related Meds:  Scheduled Meds:   . amLODipine  10 mg Oral Daily  . aspirin EC  81 mg Oral Daily  . calcium acetate  667 mg Oral TID  . [COMPLETED] ceFAZolin      . [EXPIRED]  ceFAZolin (ANCEF) IV  1 g Intravenous Q8H  . cloNIDine  0.2 mg Oral BID  . [EXPIRED] fentaNYL      . [  COMPLETED] hydrALAZINE      . influenza  inactive virus vaccine  0.5 mL Intramuscular Tomorrow-1000  . insulin aspart  0-9 Units Subcutaneous TID WC  . metoprolol succinate  50 mg Oral Daily  . mupirocin ointment   Nasal BID  . pantoprazole  40 mg Oral Daily  . [DISCONTINUED] sodium chloride  3 mL Intravenous Q12H   Continuous Infusions:   . [DISCONTINUED] sodium chloride     PRN Meds:.acetaminophen, acetaminophen, acetaminophen, acetaminophen, albuterol, [EXPIRED] alteplase, alum & mag hydroxide-simeth, calcium carbonate (dosed in mg elemental calcium), camphor-menthol, docusate sodium, hydrALAZINE, hydrOXYzine, menthol-cetylpyridinium, methocarbamol (ROBAXIN) IV, methocarbamol, morphine injection, ondansetron (ZOFRAN)  IV, ondansetron (ZOFRAN) IV, ondansetron, oxyCODONE-acetaminophen, phenol, sorbitol, sorbitol, traMADol zolpidem, [DISCONTINUED] sodium chloride, [DISCONTINUED] sodium chloride, [DISCONTINUED] 0.9 % irrigation (POUR BTL), [DISCONTINUED] bupivacaine, [DISCONTINUED] feeding supplement (NEPRO CARB STEADY), [DISCONTINUED] fentaNYL, [DISCONTINUED] heparin, [DISCONTINUED] iohexol, [DISCONTINUED] lidocaine, [DISCONTINUED] lidocaine-EPINEPHrine, [DISCONTINUED] lidocaine-prilocaine, [DISCONTINUED] metoCLOPramide, [DISCONTINUED] oxyCODONE [DISCONTINUED] oxyCODONE, [DISCONTINUED] pentafluoroprop-tetrafluoroeth, [DISCONTINUED] sodium chloride   Ht: 4\' 10"  (147.3 cm)  Wt: 130 lb 1.1 oz (59 kg)  Ideal Wt: 43.9 kg % Ideal Wt: 134%  Usual Wt: 140 lb 2 months ago Wt Readings from Last 10 Encounters:  11/26/12 130 lb 1.1 oz (59 kg)  11/26/12 130 lb 1.1 oz (59 kg)  11/04/12 135 lb (61.236 kg)  10/13/12 134 lb 11.2 oz (61.1 kg)  10/13/12 134 lb 11.2 oz (61.1 kg)  10/07/12 139 lb 8 oz (63.277 kg)  10/07/12 139 lb 14.4 oz (63.458 kg)  09/25/12 142 lb (64.411 kg)  09/25/12 142 lb (64.411 kg)  07/01/12 144 lb 9.6 oz (65.59 kg)   % Usual Wt: 93%   Body mass index is 27.18 kg/(m^2). Overweight  Food/Nutrition Related Hx:  Pt report poor appetite for the last few months  Labs:  CMP     Component Value Date/Time   NA 134* 11/25/2012 1109   K 3.8 11/25/2012 1109   CL 92* 10/13/2012 0750   CO2 24 10/13/2012 0750   GLUCOSE 104* 11/25/2012 1109   BUN 38* 10/13/2012 0750   CREATININE 9.94* 10/13/2012 0750   CALCIUM 8.7 10/13/2012 0750   PROT 8.4* 10/07/2012 1348   ALBUMIN 2.3* 10/13/2012 0750   AST 21 10/07/2012 1348   ALT 8 10/07/2012 1348   ALKPHOS 74 10/07/2012 1348   BILITOT 0.2* 10/07/2012 1348   GFRNONAA 3* 10/13/2012 0750   GFRAA 4* 10/13/2012 0750   CBG (last 3)   Basename 11/26/12 1150 11/26/12 0704 11/25/12 2234  GLUCAP 124* 73 89   Lab Results  Component Value Date   HGBA1C 5.9*  11/25/2012    Intake/Output Summary (Last 24 hours) at 11/26/12 1205 Last data filed at 11/26/12 0600  Gross per 24 hour  Intake    590 ml  Output   2105 ml  Net  -1515 ml   Diet Order: Renal 60/70-2-2  Supplements/Tube Feeding:none  IVF:    [DISCONTINUED] sodium chloride   Pt admitted with ESRD on HD for acrylic balloon kyphoplasty of L2 and L5. Pt reports that she has a poor appetite for a few months resulting in weight loss. Pt unable to provide details of nutrition hx and states I can't remember. Pt does not like Nepro but is willing to try Resource.   Estimated Nutritional Needs:   Kcal:  1600-1800 Protein:  70-85 Fluid:  >/= 1.2 L/day  NUTRITION DIAGNOSIS: Inadequate oral intake related to decreased appetite as evidenced by meal completion <50%  MONITORING/EVALUATION(Goals): Goal: Pt to meet >/=  90% of their estimated nutrition needs. Monitor: PO intake, supplement acceptance  EDUCATION NEEDS: -No education needs identified at this time  [place], [place], [place] Pager [phone] After Hours Pager  11/26/2012, 12:03 PM

## 2012-11-26 NOTE — Progress Notes (Signed)
Physical Therapy Evaluation Note  Past Medical History  Diagnosis Date  . Unspecified essential hypertension   . Hyperpotassemia   . Difficulty in walking   . Gout, unspecified 1980's    "not anymore" (11/25/2012)  . Mild mitral valve stenosis   . Joint pain   . Leg pain   . Thyroid disease   . Gangrene     left fifth toe  . Peripheral vascular disease   . Hyperlipidemia   . Aortic valve sclerosis   . Shoulder fracture, right 2012  . Blood transfusion 1960's  . Chronic back pain   . Other specified cardiac dysrhythmias     sees Dr. Angelena Form  . PONV (postoperative nausea and vomiting)   . Heart murmur     "I think so" (11/25/2012)  . Anginal pain   . Asthma     "used to; not anymore" (11/25/2012)  . Pneumonia     "once; years ago" (11/25/2012)  . Chronic bronchitis     "used to keep it; not anymroe" (11/25/2012)  . Exertional dyspnea     "not anymore" (11/25/2012)  . Type II diabetes mellitus     "states not on any medication at this time"  . Anemia   . GERD (gastroesophageal reflux disease)     "not anymore" (11/25/2012)  . Arthritis     "all over" (11/25/2012)  . ESRD (end stage renal disease) on dialysis     M, W, Fr. [place] dialysis (11/25/2012)   Past Surgical History  Procedure Date  . Av fistula repair     "right/left arm fistula failed; removed left thigh d/t poor circulation" (11/25/2012)  . Arteriovenous graft placement     left femoral loop arteriovenous Gore-Tex graft.  . Av fistula placement 2008- 2013    "left upper arm; twice in my neck; left leg; removed from left leg; right upper arm" (11/25/2012)  . Foot amputation through metatarsal 06/22/11    "left foot; whole 5th toe" (11/25/2012)  . Pr vein bypass graft,aorto-fem-pop 06/14/2011  . Femoral-popliteal bypass graft 04/11/2012    Procedure: BYPASS GRAFT FEMORAL-POPLITEAL ARTERY;  Surgeon: [name], MD;  Location: [facility];  Service: Vascular;  Laterality: Left;  Thrombectomy/Left  femoral-popliteal bypass with revision of proximal end and shortening of graft; intraoperative arteriogram; endarterectomy and patch angioplasty with distal anastomosis  . Appendectomy 1960's  . Refractive surgery     "both eyes" (11/25/2012)  . Femoral-popliteal bypass graft 10/09/2012    Procedure: BYPASS GRAFT FEMORAL-POPLITEAL ARTERY;  Surgeon: [name], MD;  Location: [place];  Service: Vascular;  Laterality: Left;  Redo left tibioperoneal trunk bypass with Gortex Graft 72mmx80cm.  . Kyphoplasty 11/25/2012    balloon, L2, L5  . Total abdominal hysterectomy 1960's    one ovary removed    11/26/12 1140  PT Visit Information  Last PT Received On 11/26/12  Assistance Needed +1  PT/OT Co-Evaluation/Treatment Yes  PT Time Calculation  PT Start Time 1140  PT Stop Time 1209  PT Time Calculation (min) 29 min  Subjective Data  Subjective Pt received supine in bed agreeable to PT. grandson present  Precautions  Precautions Back;Fall  Precaution Booklet Issued Yes (comment)  Precaution Comments pt with poor comprehension  Restrictions  Weight Bearing Restrictions No  Home Living  Lives With Spouse;Other (Comment) (has hospice care, unable to help)  Available Help at Discharge Family;Available PRN/intermittently (family work cannot visit regularly)  Type of [place] to enter  Entrance Stairs-Number of Steps 4  Home Layout One level  Bathroom Shower/Tub Other (comment) (doesn't shower)  Bathroom Toilet Handicapped height  Bathroom Accessibility Yes  How Accessible Accessible via wheelchair;Accessible via walker  [place];Wheelchair - manual;Bedside commode/3-in-1  Additional Comments grandson bumps patient up the steps in a w/c  Prior Function  Level of Independence Needs assistance  Needs Assistance Light Housekeeping  Light Housekeeping Total  Able to Take Stairs? No (family bumps her up in the w/c)  Driving No    Communication  Communication No difficulties  Cognition  Overall Cognitive Status Impaired  Area of Impairment Safety/judgement;Awareness of deficits  Arousal/Alertness Awake/alert  Orientation Level Appears intact for tasks assessed  Behavior During Session [facility] for tasks performed  Safety/Judgement Decreased awareness of safety precautions  Awareness of Deficits decreased insight into level of assist she requires  Right Upper Extremity Assessment  RUE ROM/Strength/Tone [facility] for tasks assessed  Left Upper Extremity Assessment  LUE ROM/Strength/Tone WFL for tasks assessed  Right Lower Extremity Assessment  RLE ROM/Strength/Tone WFL  Left Lower Extremity Assessment  LLE ROM/Strength/Tone WFL  Trunk Assessment  Trunk Assessment Normal  Bed Mobility  Bed Mobility Rolling Left;Left Sidelying to Sit;Sitting - Scoot to [employer] of Bed  Rolling Left 4: Min guard  Left Sidelying to Sit 4: Min guard  Sitting - Scoot to [employer] of Bed 4: Min guard  Details for Bed Mobility Assistance verbal cues for log roll technique and back precautions  Transfers  Transfers Sit to Stand;Stand to Sit  Sit to Stand 4: Min guard  Stand to Sit 4: Min guard  Details for Transfer Assistance v/cs for safe hand placement, back precautions  Ambulation/Gait  Ambulation/Gait Assistance 4: Min guard  Ambulation Distance (Feet) 20 Feet  Assistive device Rolling walker  Ambulation/Gait Assistance Details max directional v/c's for safe walker, pt with increased trunk flexion, max v/c's for safe turning with RW and to limit twisting  Gait Pattern Step-through pattern;Decreased stride length;Shuffle  Gait velocity slow  Stairs No  PT - End of Session  Equipment Utilized During Treatment Gait belt  Activity Tolerance Patient limited by fatigue  Patient left in chair;with call bell/phone within reach;with family/visitor present  Nurse Communication Mobility status  PT Assessment  Clinical Impression Statement PT s/p  kyphoplasty with h/o ESRD and on HD presenting with impaired balance, generalized weakness, and decreased insight to deficits. Patient's spouse with poor prognosis and requires hospice care. Patient is unsafe to return home without 24/7 supervision. Grandson reports they can not provide 24/7 supervision. Patient to benfeit from ST-SNF placement to achieve safe functional independence.   PT Recommendation/Assessment Patient needs continued PT services  PT Problem List Decreased strength;Decreased balance;Decreased mobility;Decreased safety awareness  Barriers to Discharge Decreased caregiver support  PT Therapy Diagnosis  Difficulty walking  PT Plan  PT Frequency Min 5X/week  PT Treatment/Interventions Gait training;Therapeutic activities;Therapeutic exercise;Balance training;Neuromuscular re-education  PT Recommendation  Follow Up Recommendations SNF;Supervision/Assistance - 24 hour  Equipment Recommended None recommended by PT  Individuals Consulted  Consulted and Agree with Results and Recommendations Patient;Family member/caregiver  Family Member Consulted grandson  Acute Rehab PT Goals  PT Goal Formulation With patient/family  Time For Goal Achievement 12/03/12  Potential to Achieve Goals Good  PT G-Codes **NOT FOR INPATIENT CLASS**  Functional Assessment Tool Used clinical judgement  Functional Limitation Mobility: Walking and moving around  Mobility: Walking and Moving Around Current Status [id number]) CI  Mobility: Walking and Moving Around Goal Status (  Harve.Dials) CJ  PT General Charges  $$ ACUTE PT VISIT 1 Procedure  PT Evaluation  $Initial PT Evaluation Tier I 1 Procedure  PT Treatments  $Gait Training 8-22 mins  Written Expression  Dominant Hand Right    Pain: reports he pain is gone  Kittie Plater, PT, DPT Pager #: [phone] Office #: [phone]

## 2012-11-26 NOTE — Evaluation (Signed)
Occupational Therapy Evaluation Patient Details Name: [name] MRN: [id number] DOB: 1934/06/12 Today's Date: 11/26/2012 Time: [id number] OT Time Calculation (min): 31 min  OT Assessment / Plan / Recommendation Clinical Impression  Pt admitted for L2 and L5 kyphoplasty.  Pt also has a hx of ESRD with HD MWF.  Pt does not have 24 hour care at home and is not safe without this level of assist at this point. Her husband is unable to provide any assist.  Pt is admitted under observation status.  SNF for ST rehab recommended and pt and her grandson is in agreement.    OT Assessment  Patient needs continued OT Services    Follow Up Recommendations  SNF    Barriers to Discharge Decreased caregiver support    Equipment Recommendations  None recommended by OT    Recommendations for Other Services    Frequency  Min 2X/week    Precautions / Restrictions Precautions Precautions: Back;Fall Precaution Booklet Issued: Yes (comment) Restrictions Weight Bearing Restrictions: No   Pertinent Vitals/Pain No pain    ADL  Eating/Feeding: Set up;Other (comment) (assist to open containers) Where Assessed - Eating/Feeding: Chair Grooming: Wash/dry hands;Min guard Where Assessed - Grooming: Supported standing Upper Body Bathing: Supervision/safety Where Assessed - Upper Body Bathing: Unsupported sitting Lower Body Bathing: Minimal assistance Where Assessed - Lower Body Bathing: Supported sit to stand Upper Body Dressing: Minimal assistance Where Assessed - Upper Body Dressing: Unsupported sitting Lower Body Dressing: Minimal assistance Where Assessed - Lower Body Dressing: Unsupported sitting;Other (comment) (crosses foot over opposite knee) Toilet Transfer: Minimal assistance Equipment Used: Rolling walker;Gait belt Transfers/Ambulation Related to ADLs: min assist with RW, verbal cues for hand placement and technique ADL Comments: Instructed pt in back precautions related to LB ADL.  Pt  does not shower due to dialysis catheter.    OT Diagnosis: Generalized weakness  OT Problem List: Decreased activity tolerance;Impaired balance (sitting and/or standing);Decreased safety awareness;Decreased strength OT Treatment Interventions: Self-care/ADL training;Patient/family education;Therapeutic activities   OT Goals Acute Rehab OT Goals OT Goal Formulation: With patient Time For Goal Achievement: 12/03/12 Potential to Achieve Goals: Good ADL Goals Pt Will Perform Grooming: with modified independence;Standing at sink (3 activities) ADL Goal: Grooming - Progress: Goal set today Pt Will Perform Lower Body Bathing: with modified independence;Sit to stand from bed ADL Goal: Lower Body Bathing - Progress: Goal set today Pt Will Perform Lower Body Dressing: with modified independence;Sit to stand from bed ADL Goal: Lower Body Dressing - Progress: Goal set today Pt Will Transfer to Toilet: with modified independence;Ambulation;with DME;Comfort height toilet;Maintaining back safety precautions ADL Goal: Toilet Transfer - Progress: Goal set today Pt Will Perform Toileting - Clothing Manipulation: with modified independence;Sitting on 3-in-1 or toilet ADL Goal: Toileting - Clothing Manipulation - Progress: Goal set today Miscellaneous OT Goals Miscellaneous OT Goal #1: Pt will perform bed mobility independently adhering to back precautions. OT Goal: Miscellaneous Goal #1 - Progress: Goal set today Miscellaneous OT Goal #2: Pt will generalize back precautions in ADL independently. OT Goal: Miscellaneous Goal #2 - Progress: Goal set today  Visit Information  Last OT Received On: 11/26/12 Assistance Needed: +1 PT/OT Co-Evaluation/Treatment: Yes    Subjective Data  Subjective: "My husband can't help, he has pancreas cancer." Patient Stated Goal: Pt wants to spend Thanksgiving with her husband.   Prior Functioning     Home Living Lives With: Spouse;Other (Comment) (has hospice care,  unable to help) Available Help at Discharge: Family;Available PRN/intermittently (family work cannot visit regularly)  Type of Home: House Home Access: Stairs to enter [employer] of Steps: 4 Home Layout: One level Bathroom Shower/Tub: Other (comment) (doesn't shower) Bathroom Toilet: Handicapped height Bathroom Accessibility: Yes How Accessible: Accessible via wheelchair;Accessible via walker [place]: [place];Wheelchair - manual;Bedside commode/3-in-1 Prior Function Level of Independence: Needs assistance Needs Assistance: Insurance claims handler Housekeeping: Total Able to Take Stairs?: No (family bumps her up in the w/c) Driving: No Communication Communication: No difficulties Dominant Hand: Right         Vision/Perception     Cognition  Overall Cognitive Status: Impaired Area of Impairment: Safety/judgement;Awareness of deficits Arousal/Alertness: Awake/alert Orientation Level: Appears intact for tasks assessed Behavior During Session: [facility] for tasks performed Safety/Judgement: Decreased awareness of safety precautions Awareness of Deficits: decreased insight into level of assist she requires    Extremity/Trunk Assessment Right Upper Extremity Assessment RUE ROM/Strength/Tone: [facility] for tasks assessed Left Upper Extremity Assessment LUE ROM/Strength/Tone: WFL for tasks assessed     Mobility Bed Mobility Bed Mobility: Rolling Left;Left Sidelying to Sit;Sitting - Scoot to Edge of Bed Rolling Left: 4: Min guard Left Sidelying to Sit: 4: Min guard Sitting - Scoot to [employer] of Bed: 4: Min guard Details for Bed Mobility Assistance: verbal cues for log roll technique Transfers Transfers: Sit to Stand;Stand to Sit Sit to Stand: 4: Min guard Stand to Sit: 4: Min guard     Shoulder Instructions     Exercise     Balance Balance Balance Assessed: Yes   End of Session OT - End of Session Equipment Utilized During Treatment: Gait  belt Activity Tolerance: Patient tolerated treatment well Patient left: with call bell/phone within reach;in chair;with family/visitor present  GO Functional Assessment Tool Used: clinical judgement Functional Limitation: Self care Self Care Current Status [id number]): At least 20 percent but less than 40 percent impaired, limited or restricted Self Care Goal Status [id number]): At least 1 percent but less than 20 percent impaired, limited or restricted   Malka So 11/26/2012, 1:15 PM [phone]

## 2012-11-26 NOTE — ED Provider Notes (Signed)
Medical screening examination/treatment/procedure(s) were conducted as a shared visit with non-physician practitioner(s) and myself.  I personally evaluated the patient during the encounter.  [phone] with back pain and leg pain after fall. New compression fx of L5 which is likely source of pain. Stable injury. No acute focal neuro deficits. No indication for emergent MRI. Outpt FU.  Virgel Manifold, MD 11/27/12 0000

## 2012-11-26 NOTE — Clinical Social Work Psychosocial (Signed)
     Clinical Social Work Department BRIEF PSYCHOSOCIAL ASSESSMENT 11/26/2012  Patient:  [name], [name]     Account Number:  [id number]     Admit date:  11/25/2012  Clinical Social Worker:  Lehman Prom  Date/Time:  11/26/2012 04:17 PM  Referred by:  Physician  Date Referred:  11/26/2012 Referred for  SNF Placement   Other Referral:   Interview type:  Patient Other interview type:    PSYCHOSOCIAL DATA Living Status:  FAMILY Admitted from facility:   Level of care:   Primary support name:  [place],Rosa Primary support relationship to patient:  CHILD, ADULT Degree of support available:   adequate    CURRENT CONCERNS Current Concerns  Post-Acute Placement   Other Concerns:    SOCIAL WORK ASSESSMENT / PLAN CSW met with pt to address consult. CSW introduced herself and explained role of social work. CSW explained process of discharging to SNF. Pt shared that she was at a SNF in the past, however does not want to return to that particular one. Pt stated that she would like to return home, however is open to the option of SNF. Pt is agreeable to this CSW initiating a SNF search for [facility]. CSW will do so and follow up with bed offers. CSW will continue to follow.   Assessment/plan status:  Psychosocial Support/Ongoing Assessment of Needs Other assessment/ plan:   Information/referral to community resources:   SNF List    PATIENTS/FAMILYS RESPONSE TO PLAN OF CARE: Pt was pleasant and alert and oriented. Pt is agreeable to SNF this time.

## 2012-11-26 NOTE — Progress Notes (Signed)
[facility] PROGRESS NOTE  BIRDINE HANDLEY [id number] DOB: 20-Jan-1934 DOA: 11/25/2012 PCP: Vena Austria, MD  Assessment/Plan: 1.Hypertension in poor control.   -better controlled today on norvasc and clonidine, follow   -ongoing back pain was likely contributing. 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 - Status post L-spine surgery by Dr. Ellene Route on 11/25/2012. 3.ESRD.  - per renal 4. Diabetes mellitus.  patient not on any medications at home, will  A1c 5.9, continue low-dose sliding scale with meals.  5. History of COPD. -when necessary nebulizer treatments and oxygen as needed. -stable  6. History of PAD, gout, mitral valve prolapse.  No acute issues, f/u outpatient with PCP.     Code Status: full Family Communication: son at bedside Disposition Plan: per primary team    HPI/Subjective: Pt denies chest pain, no SOB, no dizziness  Objective: Filed Vitals:   11/26/12 0330 11/26/12 0400 11/26/12 0430 11/26/12 0515  BP: 186/73 184/82 181/61 103/69  Pulse: 56 56 61 62  Temp:   96.7 F (35.9 C) 98.1 F (36.7 C)  TempSrc:   Oral Oral  Resp:   16 18  Height:      Weight:   59 kg (130 lb 1.1 oz)   SpO2:   97% 100%    Intake/Output Summary (Last 24 hours) at 11/26/12 0956 Last data filed at 11/26/12 0600  Gross per 24 hour  Intake    590 ml  Output   2105 ml  Net  -1515 ml   Filed Weights   11/25/12 0959 11/26/12 0000 11/26/12 0430  Weight: 61.236 kg (135 lb) 61.3 kg (135 lb 2.3 oz) 59 kg (130 lb 1.1 oz)    Exam:   General:  Alert and oriented x3, in NAD  Cardiovascular: RRR  Respiratory: decreased BS at base, no wheezes  Abdomen:soft+BS, NT/ND  Data Reviewed: Basic Metabolic Panel:  Lab [id number] 1109  NA 134*  K 3.8  CL --  CO2 --  GLUCOSE 104*  BUN --  CREATININE --  CALCIUM --  MG --  PHOS --   Liver Function Tests: No results found for this basename: AST:5,ALT:5,ALKPHOS:5,BILITOT:5,PROT:5,ALBUMIN:5 in the  last 168 hours No results found for this basename: LIPASE:5,AMYLASE:5 in the last 168 hours No results found for this basename: AMMONIA:5 in the last 168 hours CBC:  Lab 11/25/12 1109  WBC --  NEUTROABS --  HGB 11.9*  HCT 35.0*  MCV --  PLT --   Cardiac Enzymes: No results found for this basename: CKTOTAL:5,CKMB:5,CKMBINDEX:5,TROPONINI:5 in the last 168 hours BNP (last 3 results) No results found for this basename: PROBNP:3 in the last 8760 hours CBG:  Lab 11/25/12 2234 11/25/12 1759 11/25/12 1413  GLUCAP 89 74 75    Recent Results (from the past 240 hour(s))  SURGICAL PCR SCREEN     Status: Normal   Collection Time   11/25/12 10:37 AM      Component Value Range Status Comment   MRSA, PCR NEGATIVE  NEGATIVE Final    Staphylococcus aureus NEGATIVE  NEGATIVE Final      Studies: Dg Lumbar Spine 2-3 Views  11/25/2012  *RADIOLOGY REPORT*  Clinical Data: Lumbar kyphoplasty  DG C-ARM 1-60 MIN,LUMBAR SPINE - 2-3 VIEW  Comparison: 11/24/2012  Findings: Two spot images from intraoperative C-arm fluoroscopy document changes of kyphoplasty L2 and L5.  Left iliac stent is noted.  IMPRESSION:  1.  Kyphoplasty L2 and L5.   Original Report Authenticated By: D. Wallace Going, MD    Dg  Lumbar Spine Complete  11/24/2012  *RADIOLOGY REPORT*  Clinical Data: Low back pain  LUMBAR SPINE - COMPLETE 4+ VIEW  Comparison: 11/10/2012  Findings: Five lumbar-type vertebral bodies.  Normal lumbar lordosis.  Stable mild superior endplate compression deformity at L2.  Mild compression deformity at L5, new/increased.  Moderate multilevel degenerative changes.  Vascular calcifications.  Stable right groin dialysis catheter. Left iliac artery stent.  IMPRESSION: Mild compression deformity at L5, new/increased.  Correlate with the site of the patient's pain.  Stable mild superior endplate compression deformity at L2.   Original Report Authenticated By: Julian Hy, M.D.    Dg Tibia/fibula Left  11/24/2012   *RADIOLOGY REPORT*  Clinical Data: Left leg pain  LEFT TIBIA AND FIBULA - 2 VIEW  Comparison: None.  Findings: No fracture or dislocation is seen.  Degenerative changes of the knee and tibiotalar joints.  Vascular calcifications.  IMPRESSION: No fracture or dislocation is seen.   Original Report Authenticated By: Julian Hy, M.D.    Dg Chest Port 1 View  11/25/2012  *RADIOLOGY REPORT*  Clinical Data: Preop for spine surgery.  PORTABLE CHEST - 1 VIEW  Comparison: 04/11/2012.  Findings: The heart is borderline enlarged but stable.  There is tortuosity and calcification of the thoracic aorta.  The dialysis catheters are stable.  The lungs are clear.  The bony thorax is intact.  IMPRESSION: No acute cardiopulmonary findings.   Original Report Authenticated By: Marijo Sanes, M.D.    Dg C-arm 1-60 Min  11/25/2012  *RADIOLOGY REPORT*  Clinical Data: Lumbar kyphoplasty  DG C-ARM 1-60 MIN,LUMBAR SPINE - 2-3 VIEW  Comparison: 11/24/2012  Findings: Two spot images from intraoperative C-arm fluoroscopy document changes of kyphoplasty L2 and L5.  Left iliac stent is noted.  IMPRESSION:  1.  Kyphoplasty L2 and L5.   Original Report Authenticated By: D. Wallace Going, MD     Scheduled Meds:   . amLODipine  10 mg Oral Daily  . aspirin EC  81 mg Oral Daily  . calcium acetate  667 mg Oral TID  . [COMPLETED] ceFAZolin      . [EXPIRED]  ceFAZolin (ANCEF) IV  1 g Intravenous Q8H  . cloNIDine  0.2 mg Oral BID  . [EXPIRED] fentaNYL      . [COMPLETED] hydrALAZINE      . influenza  inactive virus vaccine  0.5 mL Intramuscular Tomorrow-1000  . insulin aspart  0-9 Units Subcutaneous TID WC  . [COMPLETED] metoprolol succinate  50 mg Oral Once  . metoprolol succinate  50 mg Oral Daily  . mupirocin ointment   Nasal BID  . [COMPLETED] mupirocin ointment      . pantoprazole  40 mg Oral Daily  . [DISCONTINUED] sodium chloride  3 mL Intravenous Q12H   Continuous Infusions:   . [DISCONTINUED] sodium chloride       Principal Problem:  *S/P lumbar spine operation Active Problems:  DIABETES MELLITUS, TYPE II  GOUT  HYPERTENSION  Mitral valve disorders  COPD  RENAL FAILURE, END STAGE  Atherosclerosis of native arteries of the extremities with intermittent claudication  PVD (peripheral vascular disease) with claudication  PVD (peripheral vascular disease)    Time spent: 84mins    Johnetta Sloniker C  [facility] Pager [phone]. If 8PM-8AM, please contact night-coverage at [email or web address], password [facility] 11/26/2012, 9:56 AM  LOS: 1 day

## 2012-11-26 NOTE — Progress Notes (Signed)
Subjective: Patient reports Back feels much better. Patient has been more ambulatory and out of bed to chair.  Objective: Vital signs in last 24 hours: Temp:  [96.7 F (35.9 C)-98.5 F (36.9 C)] 98.5 F (36.9 C) (11/27 1744) Pulse Rate:  [52-72] 60  (11/27 1744) Resp:  [16-18] 18  (11/27 1744) BP: (103-210)/(45-145) 147/49 mmHg (11/27 1744) SpO2:  [97 %-100 %] 100 % (11/27 1744) Weight:  [59 kg (130 lb 1.1 oz)-61.3 kg (135 lb 2.3 oz)] 59 kg (130 lb 1.1 oz) (11/27 0430)  Intake/Output from previous day: 11/26 0701 - 11/27 0700 In: 590 [P.O.:240; I.V.:350] Out: 2105 [Blood:5] Intake/Output this shift:    Incisions clean and dry motor function intact in lower extremities.  Lab Results:  Basename 11/25/12 1109  WBC --  HGB 11.9*  HCT 35.0*  PLT --   BMET  Basename 11/25/12 1109  NA 134*  K 3.8  CL --  CO2 --  GLUCOSE 104*  BUN --  CREATININE --  CALCIUM --    Studies/Results: Dg Lumbar Spine 2-3 Views  11/25/2012  *RADIOLOGY REPORT*  Clinical Data: Lumbar kyphoplasty  DG C-ARM 1-60 MIN,LUMBAR SPINE - 2-3 VIEW  Comparison: 11/24/2012  Findings: Two spot images from intraoperative C-arm fluoroscopy document changes of kyphoplasty L2 and L5.  Left iliac stent is noted.  IMPRESSION:  1.  Kyphoplasty L2 and L5.   Original Report Authenticated By: D. Wallace Going, MD    Dg Chest Port 1 View  11/25/2012  *RADIOLOGY REPORT*  Clinical Data: Preop for spine surgery.  PORTABLE CHEST - 1 VIEW  Comparison: 04/11/2012.  Findings: The heart is borderline enlarged but stable.  There is tortuosity and calcification of the thoracic aorta.  The dialysis catheters are stable.  The lungs are clear.  The bony thorax is intact.  IMPRESSION: No acute cardiopulmonary findings.   Original Report Authenticated By: Marijo Sanes, M.D.    Dg C-arm 1-60 Min  11/25/2012  *RADIOLOGY REPORT*  Clinical Data: Lumbar kyphoplasty  DG C-ARM 1-60 MIN,LUMBAR SPINE - 2-3 VIEW  Comparison: 11/24/2012   Findings: Two spot images from intraoperative C-arm fluoroscopy document changes of kyphoplasty L2 and L5.  Left iliac stent is noted.  IMPRESSION:  1.  Kyphoplasty L2 and L5.   Original Report Authenticated By: D. Wallace Going, MD     Assessment/Plan: Stable postop. Patient has received dialysis.  LOS: 1 day  Plan discharge in a.m.   [name] 11/26/2012, 6:40 PM

## 2012-11-26 NOTE — Progress Notes (Signed)
Hemodialysis-Orders for HD today. Pt is refusing upon arrival to room. Amalia Hailey PA notified.

## 2012-11-26 NOTE — Clinical Social Work Note (Signed)
Clinical Social Work   CSW received consult for SNF. CSW reviewed chart and discussed pt with RN during progression. Awaiting for PT recommendations for discharge plan. CSW will assess for SNF if appropriate. Please call with any urgent needs. CSW will continue to follow.   Darden Dates, MSW, [place]

## 2012-11-26 NOTE — Discharge Summary (Signed)
Physician Discharge Summary  Patient ID: [name] MRN: [id number] DOB/AGE: 1934-10-19 78 y.o.  Admit date: 11/25/2012 Discharge date: 11/26/2012  Admission Diagnoses: Compression fracture L2 and L5, pathologic ( osteoporosis) end-stage renal failure on hemodialysis. Intractable back pain. Hypertension  Discharge Diagnoses: Osteoporotic compression fracture L2-L5. End-stage renal failure on hemodialysis. Intractable back pain. Hypertension. Principal Problem:  *S/P lumbar spine operation Active Problems:  DIABETES MELLITUS, TYPE II  GOUT  HYPERTENSION  Mitral valve disorders  COPD  RENAL FAILURE, END STAGE  Atherosclerosis of native arteries of the extremities with intermittent claudication  PVD (peripheral vascular disease) with claudication  PVD (peripheral vascular disease)   Discharged Condition: fair  Hospital Course: Patient was admitted to undergo acrylic balloon kyphoplasty of L2 and L5 this was performed successfully however the patient had Mr. previous days dialysis treatment was experiencing significant hypertension. Consultation was obtained with the renal service the patient underwent hemodialysis. Her back pain is been much improved.  Consults: nephrology and hospitalist service  Significant Diagnostic Studies: X-ray lumbar spine  Treatments: Acrylic balloon kyphoplasty L2-L5, renal hemodialysis  Discharge Exam: Blood pressure 147/49, pulse 60, temperature 98.5 F (36.9 C), temperature source Oral, resp. rate 18, height 4\' 10"  (1.473 m), weight 59 kg (130 lb 1.1 oz), SpO2 100.00%. Motor function intact in lower extremities. Peripheral edema resolved. Lower extremities moving well, ambulatory with walker  Disposition: 01-Home or Self Care  Discharge Orders    Future Appointments: Provider: Department: Dept Phone: Center:   12/05/2012 3:45 PM Conrad Altamont, MD [facility] [phone] [facility]   02/03/2013 10:30 AM [facility]Lab Lab 4  [facility] [phone] VVS   02/03/2013 11:00 AM [facility]Lab Lab 4 [facility] [phone] [facility]VS   02/03/2013 11:40 AM Princess Perna, NP [facility] [phone] VVS     Future Orders Please Complete By Expires   Diet - low sodium heart healthy      Increase activity slowly      Discharge instructions      Comments:   Okay to shower. Do not apply salves or appointments to incision. No heavy lifting with the upper extremities greater than 15 pounds. May resume driving when not requiring pain medication and patient feels comfortable with doing so.   Call MD for:  redness, tenderness, or signs of infection (pain, swelling, redness, odor or green/yellow discharge around incision site)      Call MD for:  severe uncontrolled pain      Call MD for:  temperature >100.4          Medication List     As of 11/26/2012  6:44 PM    TAKE these medications         amLODipine 10 MG tablet   Commonly known as: NORVASC   Take 10 mg by mouth daily.      aspirin EC 81 MG tablet   Take 81 mg by mouth daily.      calcium acetate 667 MG capsule   Commonly known as: PHOSLO   Take 667 mg by mouth 3 (three) times daily. 1 tablet with every meal, 2 tabs with snacks      cloNIDine 0.2 MG tablet   Commonly known as: CATAPRES   Take 0.2 mg by mouth 2 (two) times daily.      metoprolol succinate 50 MG 24 hr tablet   Commonly known as: TOPROL-XL   Take 50 mg by mouth daily. Take  with or immediately following a meal.      pantoprazole 40 MG tablet   Commonly known as: PROTONIX   Take 40 mg by mouth daily.      sorbitol 70 % solution   Take 30 mLs by mouth daily as needed. For bowels      traMADol 50 MG tablet   Commonly known as: ULTRAM   Take 50-100 mg by mouth every 6 (six) hours as needed. For pain         Signed: Earleen Newport 11/26/2012, 6:44 PM

## 2012-11-26 NOTE — Progress Notes (Signed)
Patient returned to room from hemodialysis.  Patient made comfortable in bed, pain assessed, and pre-dialysis medications given.  Will monitor patient.

## 2012-11-26 NOTE — Clinical Social Work Placement (Signed)
     Clinical Social Work Department CLINICAL SOCIAL WORK PLACEMENT NOTE 11/26/2012  Patient:  KEYUNDRA, VICKREY  Account Number:  [id number] Admit date:  11/25/2012  Clinical Social Worker:  Lehman Prom  Date/time:  11/26/2012 04:24 PM  Clinical Social Work is seeking post-discharge placement for this patient at the following level of care:   SKILLED NURSING   (*CSW will update this form in Epic as items are completed)   11/26/2012  Patient/family provided with [place] Department of Clinical Social Works list of facilities offering this level of care within the geographic area requested by the patient (or if unable, by the patients family).  11/26/2012  Patient/family informed of their freedom to choose among providers that offer the needed level of care, that participate in Medicare, Medicaid or managed care program needed by the patient, have an available bed and are willing to accept the patient.  11/26/2012  Patient/family informed of [facility] ownership interest in [facility], as well as of the fact that they are under no obligation to receive care at this facility.  PASARR submitted to EDS on 11/26/2012 PASARR number received from EDS on 11/26/2012  FL2 transmitted to all facilities in geographic area requested by pt/family on  11/26/2012 FL2 transmitted to all facilities within larger geographic area on   Patient informed that his/her managed care company has contracts with or will negotiate with  certain facilities, including the following:     Patient/family informed of bed offers received:   Patient chooses bed at  Physician recommends and patient chooses bed at    Patient to be transferred to  on   Patient to be transferred to facility by   The following physician request were entered in Epic:   Additional Comments:

## 2012-11-26 NOTE — Progress Notes (Signed)
Utilization review completed.  P.J. Kasten Leveque,RN,BSN Case Manager [phone]  

## 2012-11-27 DIAGNOSIS — I10 Essential (primary) hypertension: Secondary | ICD-10-CM

## 2012-11-27 LAB — GLUCOSE, CAPILLARY

## 2012-11-27 NOTE — Progress Notes (Signed)
S:Back pain better..has been up walking O:BP 156/48  Pulse 59  Temp 98.2 F (36.8 C) (Oral)  Resp 18  Ht 4\' 10"  (1.473 m)  Wt 59 kg (130 lb 1.1 oz)  BMI 27.18 kg/m2  SpO2 100% No intake or output data in the 24 hours ending 11/27/12 0907 Weight change:  [id number] and alert CVS:RRR Resp:Clear Abd:+ BS NTND Ext:no edema  Rt fem PC NEURO:CNI Ox3      . amLODipine  10 mg Oral Daily  . aspirin EC  81 mg Oral Daily  . calcium acetate  667 mg Oral TID  . [EXPIRED]  ceFAZolin (ANCEF) IV  1 g Intravenous Q8H  . cloNIDine  0.2 mg Oral BID  . feeding supplement  1 Container Oral TID BM  . influenza  inactive virus vaccine  0.5 mL Intramuscular Tomorrow-1000  . insulin aspart  0-9 Units Subcutaneous TID WC  . metoprolol succinate  50 mg Oral Daily  . mupirocin ointment   Nasal BID  . pantoprazole  40 mg Oral Daily   Dg Lumbar Spine 2-3 Views  11/25/2012  *RADIOLOGY REPORT*  Clinical Data: Lumbar kyphoplasty  DG C-ARM 1-60 MIN,LUMBAR SPINE - 2-3 VIEW  Comparison: 11/24/2012  Findings: Two spot images from intraoperative C-arm fluoroscopy document changes of kyphoplasty L2 and L5.  Left iliac stent is noted.  IMPRESSION:  1.  Kyphoplasty L2 and L5.   Original Report Authenticated By: D. Wallace Going, MD    Dg Chest Port 1 View  11/25/2012  *RADIOLOGY REPORT*  Clinical Data: Preop for spine surgery.  PORTABLE CHEST - 1 VIEW  Comparison: 04/11/2012.  Findings: The heart is borderline enlarged but stable.  There is tortuosity and calcification of the thoracic aorta.  The dialysis catheters are stable.  The lungs are clear.  The bony thorax is intact.  IMPRESSION: No acute cardiopulmonary findings.   Original Report Authenticated By: Marijo Sanes, M.D.    Dg C-arm 1-60 Min  11/25/2012  *RADIOLOGY REPORT*  Clinical Data: Lumbar kyphoplasty  DG C-ARM 1-60 MIN,LUMBAR SPINE - 2-3 VIEW  Comparison: 11/24/2012  Findings: Two spot images from intraoperative C-arm fluoroscopy document changes of  kyphoplasty L2 and L5.  Left iliac stent is noted.  IMPRESSION:  1.  Kyphoplasty L2 and L5.   Original Report Authenticated By: D. Wallace Going, MD    BMET    Component Value Date/Time   NA 134* 11/25/2012 1109   K 3.8 11/25/2012 1109   CL 92* 10/13/2012 0750   CO2 24 10/13/2012 0750   GLUCOSE 104* 11/25/2012 1109   BUN 38* 10/13/2012 0750   CREATININE 9.94* 10/13/2012 0750   CALCIUM 8.7 10/13/2012 0750   GFRNONAA 3* 10/13/2012 0750   GFRAA 4* 10/13/2012 0750   CBC    Component Value Date/Time   WBC 6.6 10/13/2012 0750   WBC 6.2 11/06/2006 0920   RBC 2.85* 10/13/2012 0750   RBC 4.25 11/06/2006 0920   HGB 11.9* 11/25/2012 1109   HGB 12.2 11/06/2006 0920   HCT 35.0* 11/25/2012 1109   HCT 37.7 11/06/2006 0920   PLT 206 10/13/2012 0750   PLT 375 11/06/2006 0920   MCV 94.7 10/13/2012 0750   MCV 88.7 11/06/2006 0920   MCH 30.9 10/13/2012 0750   MCH 28.7 11/06/2006 0920   MCHC 32.6 10/13/2012 0750   MCHC 32.4 11/06/2006 0920   RDW 12.6 10/13/2012 0750   RDW 15.5* 11/06/2006 0920   LYMPHSABS 1.7 06/18/2011 0533   LYMPHSABS 1.5 11/06/2006 0920  MONOABS 1.0 06/18/2011 0533   MONOABS 0.5 11/06/2006 0920   EOSABS 0.8* 06/18/2011 0533   EOSABS 0.2 11/06/2006 0920   BASOSABS 0.1 06/18/2011 0533   BASOSABS 0.0 11/06/2006 0920     Assessment: 1. SP Kyphoplasty with improved pain 2. HTN 3. Anemia 4. Sec HPTH  Plan: 1.  Note plan for DC 2.  Pt knows to FU tomorrow at her usual HD time   Izetta Sakamoto T

## 2012-11-27 NOTE — Progress Notes (Signed)
Pt d/c to home by car with family. Assessment stable. Pt states understanding of d/c instructions.

## 2012-11-27 NOTE — Progress Notes (Signed)
[facility] PROGRESS NOTE  [name] [id number] DOB: 02/18/1934 DOA: 11/25/2012 PCP: Margaret Austria, MD  Assessment/Plan: 1.Hypertension in poor control.   -better controll on norvasc and clonidine, she is to continue this upon discharge  -Instructed to followup with her PCP in one to 2 weeks for further monitoring of her blood pressures and adjustment of medications as appropriate for optimal blood pressure control. 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 - Status post L-spine surgery by Dr. Ellene [name] on 11/25/2012. 3.ESRD.  - per renal 4. Diabetes mellitus.  patient not on any medications at home, will  A1c 5.9, continue low-dose sliding scale with meals.  5. History of COPD. -when necessary nebulizer treatments and oxygen as needed. -stable  6. History of PAD, gout, mitral valve prolapse.  No acute issues, f/u outpatient with PCP.     Code Status: full Family Communication: son at bedside Disposition Plan: per primary team    HPI/Subjective: She feels better, denies any new complaints  Objective: Filed Vitals:   11/26/12 2353 11/27/12 0140 11/27/12 0530 11/27/12 1002  BP: 117/43 125/45 156/48   Pulse: 59 57 59 67  Temp:  98 F (36.7 C) 98.2 F (36.8 C)   TempSrc:  Oral Oral   Resp:  18 18   Height:      Weight:      SpO2:  99% 100%    No intake or output data in the 24 hours ending 11/27/12 1030 Filed Weights   11/25/12 0959 11/26/12 0000 11/26/12 0430  Weight: 61.236 kg (135 lb) 61.3 kg (135 lb 2.3 oz) 59 kg (130 lb 1.1 oz)    Exam:   General:  Alert and oriented x3, in NAD  Cardiovascular: RRR  Respiratory: decreased BS at base, no wheezes  Abdomen:soft+BS, NT/ND  Data Reviewed: Basic Metabolic Panel:  Lab [id number] 1109  NA 134*  K 3.8  CL --  CO2 --  GLUCOSE 104*  BUN --  CREATININE --  CALCIUM --  MG --  PHOS --   Liver Function Tests: No results found for this basename:  AST:5,ALT:5,ALKPHOS:5,BILITOT:5,PROT:5,ALBUMIN:5 in the last 168 hours No results found for this basename: LIPASE:5,AMYLASE:5 in the last 168 hours No results found for this basename: AMMONIA:5 in the last 168 hours CBC:  Lab 11/25/12 1109  WBC --  NEUTROABS --  HGB 11.9*  HCT 35.0*  MCV --  PLT --   Cardiac Enzymes: No results found for this basename: CKTOTAL:5,CKMB:5,CKMBINDEX:5,TROPONINI:5 in the last 168 hours BNP (last 3 results) No results found for this basename: PROBNP:3 in the last 8760 hours CBG:  Lab 11/27/12 0627 11/26/12 2222 11/26/12 1655 11/26/12 1150 11/26/12 0704  GLUCAP 123* 157* 106* 124* 73    Recent Results (from the past 240 hour(s))  SURGICAL PCR SCREEN     Status: Normal   Collection Time   11/25/12 10:37 AM      Component Value Range Status Comment   MRSA, PCR NEGATIVE  NEGATIVE Final    Staphylococcus aureus NEGATIVE  NEGATIVE Final      Studies: Dg Lumbar Spine 2-3 Views  11/25/2012  *RADIOLOGY REPORT*  Clinical Data: Lumbar kyphoplasty  DG C-ARM 1-60 MIN,LUMBAR SPINE - 2-3 VIEW  Comparison: 11/24/2012  Findings: Two spot images from intraoperative C-arm fluoroscopy document changes of kyphoplasty L2 and L5.  Left iliac stent is noted.  IMPRESSION:  1.  Kyphoplasty L2 and L5.   Original Report Authenticated By: D. Wallace Going, MD    Dg Chest  Port 1 View  11/25/2012  *RADIOLOGY REPORT*  Clinical Data: Preop for spine surgery.  PORTABLE CHEST - 1 VIEW  Comparison: 04/11/2012.  Findings: The heart is borderline enlarged but stable.  There is tortuosity and calcification of the thoracic aorta.  The dialysis catheters are stable.  The lungs are clear.  The bony thorax is intact.  IMPRESSION: No acute cardiopulmonary findings.   Original Report Authenticated By: [name], M.D.    Dg C-arm 1-60 Min  11/25/2012  *RADIOLOGY REPORT*  Clinical Data: Lumbar kyphoplasty  DG C-ARM 1-60 MIN,LUMBAR SPINE - 2-3 VIEW  Comparison: 11/24/2012  Findings: Two spot  images from intraoperative C-arm fluoroscopy document changes of kyphoplasty L2 and L5.  Left iliac stent is noted.  IMPRESSION:  1.  Kyphoplasty L2 and L5.   Original Report Authenticated By: D. Wallace Going, MD     Scheduled Meds:    . amLODipine  10 mg Oral Daily  . aspirin EC  81 mg Oral Daily  . calcium acetate  667 mg Oral TID  . cloNIDine  0.2 mg Oral BID  . feeding supplement  1 Container Oral TID BM  . [EXPIRED] influenza  inactive virus vaccine  0.5 mL Intramuscular Tomorrow-1000  . insulin aspart  0-9 Units Subcutaneous TID WC  . metoprolol succinate  50 mg Oral Daily  . mupirocin ointment   Nasal BID  . pantoprazole  40 mg Oral Daily   Continuous Infusions:   Principal Problem:  *S/P lumbar spine operation Active Problems:  DIABETES MELLITUS, TYPE II  GOUT  HYPERTENSION  Mitral valve disorders  COPD  RENAL FAILURE, END STAGE  Atherosclerosis of native arteries of the extremities with intermittent claudication  PVD (peripheral vascular disease) with claudication  PVD (peripheral vascular disease)    Time spent: 87mins    [name] C  [facility] Pager [phone]. If 8PM-8AM, please contact night-coverage at [email or web address], password [facility] 11/27/2012, 10:30 AM  LOS: 2 days

## 2012-12-01 ENCOUNTER — Encounter (HOSPITAL_COMMUNITY): Payer: Self-pay | Admitting: Neurological Surgery

## 2012-12-04 ENCOUNTER — Encounter: Payer: Self-pay | Admitting: Vascular Surgery

## 2012-12-05 ENCOUNTER — Ambulatory Visit: Payer: Medicare Other | Admitting: Vascular Surgery

## 2012-12-25 ENCOUNTER — Encounter: Payer: Self-pay | Admitting: Vascular Surgery

## 2012-12-26 ENCOUNTER — Ambulatory Visit: Payer: Medicare Other | Admitting: Vascular Surgery

## 2013-02-02 ENCOUNTER — Encounter: Payer: Self-pay | Admitting: Neurosurgery

## 2013-02-03 ENCOUNTER — Encounter: Payer: Self-pay | Admitting: Neurosurgery

## 2013-02-03 ENCOUNTER — Encounter (INDEPENDENT_AMBULATORY_CARE_PROVIDER_SITE_OTHER): Payer: Medicare Other | Admitting: *Deleted

## 2013-02-03 ENCOUNTER — Other Ambulatory Visit: Payer: Self-pay | Admitting: Nephrology

## 2013-02-03 ENCOUNTER — Ambulatory Visit (INDEPENDENT_AMBULATORY_CARE_PROVIDER_SITE_OTHER): Payer: Medicare Other | Admitting: Neurosurgery

## 2013-02-03 VITALS — BP 106/58 | HR 55 | Resp 16 | Ht <= 58 in | Wt 125.8 lb

## 2013-02-03 DIAGNOSIS — I739 Peripheral vascular disease, unspecified: Secondary | ICD-10-CM

## 2013-02-03 DIAGNOSIS — Z48812 Encounter for surgical aftercare following surgery on the circulatory system: Secondary | ICD-10-CM

## 2013-02-03 DIAGNOSIS — R911 Solitary pulmonary nodule: Secondary | ICD-10-CM

## 2013-02-03 NOTE — Addendum Note (Signed)
Addended by: Reola Calkins on: 02/03/2013 04:17 PM   Modules accepted: Orders
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
## 2013-02-10 ENCOUNTER — Other Ambulatory Visit: Payer: Medicare Other

## 2013-02-19 ENCOUNTER — Other Ambulatory Visit: Payer: Medicare Other

## 2013-02-19 ENCOUNTER — Encounter: Payer: Self-pay | Admitting: Vascular Surgery

## 2013-02-20 ENCOUNTER — Encounter: Payer: Self-pay | Admitting: Vascular Surgery

## 2013-02-20 ENCOUNTER — Ambulatory Visit (INDEPENDENT_AMBULATORY_CARE_PROVIDER_SITE_OTHER): Payer: Medicare Other | Admitting: Vascular Surgery

## 2013-02-20 VITALS — BP 143/59 | HR 53 | Resp 18 | Ht <= 58 in | Wt 124.0 lb

## 2013-02-20 DIAGNOSIS — N186 End stage renal disease: Secondary | ICD-10-CM

## 2013-02-20 NOTE — Progress Notes (Signed)
[facility] [place]  Established Dialysis Access  History of Present Illness  HAELEE [name] is a 79 y.o. (09-02-1934) female who presents for re-evaluation for permanent access.  The patient is right hand dominant.  Previous access procedures have been completed in both arms and left leg.  She currently dialyzes from a R femoral TDC.  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  The patient has never had a previous PPM placed.  The patient has been lost to follow up due to spinal fracture complications.  Past Medical History, Past Surgical History, Social History, Family History, Medications, Allergies, and Review of Systems are unchanged from previous visit on 09/25/12.  Physical Examination  Filed Vitals:   02/20/13 1636  BP: 143/59  Pulse: 53  Resp: 18  Height: 4\' 10"  (1.473 m)  Weight: 124 lb (56.246 kg)   Body mass index is 25.92 kg/(m^2).  General: A&O x 3, WDWN  Pulmonary: Sym exp, good air movt, CTAB, no rales, rhonchi, & wheezing  Cardiac: RRR, Nl S1, S2, no Murmurs, rubs or gallops  Gastrointestinal: soft, NTND, -G/R, - HSM, - masses, - CVAT B  Musculoskeletal: M/S 5/5 throughout BUE , upper extremities without  ischemic changes   Neurologic: Pain and light touch intact in upper extremities ,Motor exam as listed above  Medical Decision Making  [name] is a 79 y.o. female who presents with ESRD requiring hemodialysis.   Based a prior venogram, the patient might have an option for a RUA AVG with a Gore hybrid AVG  I would obtain a RUE arterial duplex before proceeding as I don't feel a strong brachial artery.  She will follow up in the next 2-4 week with this study.  Based on those findings, we might be able to procedure with the [facility] hybrid AVG.  [name], MD [facility] [place] Office: [phone] Pager:  [phone]  02/20/2013, 5:05 PM

## 2013-02-23 ENCOUNTER — Other Ambulatory Visit: Payer: Self-pay | Admitting: *Deleted

## 2013-02-23 DIAGNOSIS — N186 End stage renal disease: Secondary | ICD-10-CM

## 2013-02-24 ENCOUNTER — Ambulatory Visit
Admission: RE | Admit: 2013-02-24 | Discharge: 2013-02-24 | Disposition: A | Discharge status: Home / Self Care | Payer: Medicare Other | Source: Ambulatory Visit | Attending: Nephrology | Admitting: Nephrology

## 2013-02-24 ENCOUNTER — Ambulatory Visit: Payer: Medicare Other | Admitting: Vascular Surgery

## 2013-03-07 ENCOUNTER — Encounter (HOSPITAL_COMMUNITY): Payer: Self-pay | Admitting: *Deleted

## 2013-03-07 ENCOUNTER — Inpatient Hospital Stay (HOSPITAL_COMMUNITY)
Admission: EM | Admit: 2013-03-07 | Discharge: 2013-03-10 | DRG: 393 | Disposition: A | Discharge status: Home / Self Care | Payer: Medicare Other | Attending: Internal Medicine | Admitting: Internal Medicine

## 2013-03-07 ENCOUNTER — Observation Stay (HOSPITAL_COMMUNITY): Payer: Medicare Other

## 2013-03-07 DIAGNOSIS — K5289 Other specified noninfective gastroenteritis and colitis: Secondary | ICD-10-CM | POA: Diagnosis present

## 2013-03-07 DIAGNOSIS — I10 Essential (primary) hypertension: Secondary | ICD-10-CM | POA: Diagnosis present

## 2013-03-07 DIAGNOSIS — K529 Noninfective gastroenteritis and colitis, unspecified: Secondary | ICD-10-CM | POA: Diagnosis present

## 2013-03-07 DIAGNOSIS — R222 Localized swelling, mass and lump, trunk: Secondary | ICD-10-CM | POA: Diagnosis present

## 2013-03-07 DIAGNOSIS — E119 Type 2 diabetes mellitus without complications: Secondary | ICD-10-CM

## 2013-03-07 DIAGNOSIS — K219 Gastro-esophageal reflux disease without esophagitis: Secondary | ICD-10-CM | POA: Diagnosis present

## 2013-03-07 DIAGNOSIS — Z91158 Patient's noncompliance with renal dialysis for other reason: Secondary | ICD-10-CM

## 2013-03-07 DIAGNOSIS — K55059 Acute (reversible) ischemia of intestine, part and extent unspecified: Secondary | ICD-10-CM | POA: Diagnosis present

## 2013-03-07 DIAGNOSIS — R000 Tachycardia, unspecified: Secondary | ICD-10-CM

## 2013-03-07 DIAGNOSIS — R262 Difficulty in walking, not elsewhere classified: Secondary | ICD-10-CM

## 2013-03-07 DIAGNOSIS — E785 Hyperlipidemia, unspecified: Secondary | ICD-10-CM | POA: Diagnosis present

## 2013-03-07 DIAGNOSIS — J449 Chronic obstructive pulmonary disease, unspecified: Secondary | ICD-10-CM | POA: Diagnosis present

## 2013-03-07 DIAGNOSIS — I70219 Atherosclerosis of native arteries of extremities with intermittent claudication, unspecified extremity: Secondary | ICD-10-CM

## 2013-03-07 DIAGNOSIS — I498 Other specified cardiac arrhythmias: Secondary | ICD-10-CM

## 2013-03-07 DIAGNOSIS — D649 Anemia, unspecified: Secondary | ICD-10-CM | POA: Diagnosis present

## 2013-03-07 DIAGNOSIS — R072 Precordial pain: Secondary | ICD-10-CM

## 2013-03-07 DIAGNOSIS — Z87891 Personal history of nicotine dependence: Secondary | ICD-10-CM

## 2013-03-07 DIAGNOSIS — R52 Pain, unspecified: Secondary | ICD-10-CM

## 2013-03-07 DIAGNOSIS — Z48812 Encounter for surgical aftercare following surgery on the circulatory system: Secondary | ICD-10-CM

## 2013-03-07 DIAGNOSIS — S98139A Complete traumatic amputation of one unspecified lesser toe, initial encounter: Secondary | ICD-10-CM

## 2013-03-07 DIAGNOSIS — M109 Gout, unspecified: Secondary | ICD-10-CM

## 2013-03-07 DIAGNOSIS — M79673 Pain in unspecified foot: Secondary | ICD-10-CM

## 2013-03-07 DIAGNOSIS — E875 Hyperkalemia: Secondary | ICD-10-CM | POA: Diagnosis present

## 2013-03-07 DIAGNOSIS — Z992 Dependence on renal dialysis: Secondary | ICD-10-CM

## 2013-03-07 DIAGNOSIS — R1013 Epigastric pain: Secondary | ICD-10-CM

## 2013-03-07 DIAGNOSIS — K551 Chronic vascular disorders of intestine: Principal | ICD-10-CM | POA: Diagnosis present

## 2013-03-07 DIAGNOSIS — I739 Peripheral vascular disease, unspecified: Secondary | ICD-10-CM

## 2013-03-07 DIAGNOSIS — Z9889 Other specified postprocedural states: Secondary | ICD-10-CM

## 2013-03-07 DIAGNOSIS — R110 Nausea: Secondary | ICD-10-CM

## 2013-03-07 DIAGNOSIS — M1289 Other specific arthropathies, not elsewhere classified, multiple sites: Secondary | ICD-10-CM | POA: Diagnosis present

## 2013-03-07 DIAGNOSIS — I059 Rheumatic mitral valve disease, unspecified: Secondary | ICD-10-CM

## 2013-03-07 DIAGNOSIS — N186 End stage renal disease: Secondary | ICD-10-CM | POA: Diagnosis present

## 2013-03-07 DIAGNOSIS — Z7982 Long term (current) use of aspirin: Secondary | ICD-10-CM

## 2013-03-07 DIAGNOSIS — R002 Palpitations: Secondary | ICD-10-CM

## 2013-03-07 DIAGNOSIS — E871 Hypo-osmolality and hyponatremia: Secondary | ICD-10-CM | POA: Diagnosis present

## 2013-03-07 DIAGNOSIS — J4489 Other specified chronic obstructive pulmonary disease: Secondary | ICD-10-CM | POA: Diagnosis present

## 2013-03-07 DIAGNOSIS — I120 Hypertensive chronic kidney disease with stage 5 chronic kidney disease or end stage renal disease: Secondary | ICD-10-CM | POA: Diagnosis present

## 2013-03-07 DIAGNOSIS — N2581 Secondary hyperparathyroidism of renal origin: Secondary | ICD-10-CM | POA: Diagnosis present

## 2013-03-07 DIAGNOSIS — Z9115 Patient's noncompliance with renal dialysis: Secondary | ICD-10-CM

## 2013-03-07 DIAGNOSIS — Z79899 Other long term (current) drug therapy: Secondary | ICD-10-CM

## 2013-03-07 DIAGNOSIS — R001 Bradycardia, unspecified: Secondary | ICD-10-CM

## 2013-03-07 HISTORY — DX: Essential (primary) hypertension: I10

## 2013-03-07 LAB — CBC WITH DIFFERENTIAL/PLATELET
Basophils Absolute: 0 10*3/uL (ref 0.0–0.1)
Eosinophils Relative: 3 % (ref 0–5)
Lymphocytes Relative: 29 % (ref 12–46)
MCV: 92.9 fL (ref 78.0–100.0)
Platelets: 249 10*3/uL (ref 150–400)
RDW: 12.9 % (ref 11.5–15.5)
WBC: 5.7 10*3/uL (ref 4.0–10.5)

## 2013-03-07 LAB — COMPREHENSIVE METABOLIC PANEL
ALT: 9 U/L (ref 0–35)
AST: 10 U/L (ref 0–37)
Albumin: 3.7 g/dL (ref 3.5–5.2)
CO2: 24 mEq/L (ref 19–32)
Calcium: 10.1 mg/dL (ref 8.4–10.5)
GFR calc non Af Amer: 3 mL/min — ABNORMAL LOW (ref >90)
Sodium: 131 mEq/L — ABNORMAL LOW (ref 135–145)
Total Protein: 8.2 g/dL (ref 6.0–8.3)

## 2013-03-07 LAB — TYPE AND SCREEN
ABO/RH(D): A POS
Antibody Screen: NEGATIVE

## 2013-03-07 LAB — GLUCOSE, CAPILLARY
Glucose-Capillary: 40 mg/dL — CL (ref 70–99)
Glucose-Capillary: 48 mg/dL — ABNORMAL LOW (ref 70–99)
Glucose-Capillary: 46 mg/dL — ABNORMAL LOW (ref 70–99)
Glucose-Capillary: 85 mg/dL (ref 70–99)
Glucose-Capillary: 113 mg/dL — ABNORMAL HIGH (ref 70–99)

## 2013-03-07 LAB — TROPONIN I: Troponin I: <0.3 ng/mL (ref <0.30)

## 2013-03-07 LAB — MRSA PCR SCREENING: MRSA by PCR: NEGATIVE

## 2013-03-07 LAB — OCCULT BLOOD, POC DEVICE: Fecal Occult Bld: NEGATIVE

## 2013-03-07 MED ORDER — INSULIN ASPART 100 UNIT/ML ~~LOC~~ SOLN
10.0000 [IU] | Freq: Once | SUBCUTANEOUS | Status: AC
  Administered 2013-03-07: 10 [IU] via SUBCUTANEOUS
  Filled 2013-03-07: qty 1

## 2013-03-07 MED ORDER — SODIUM POLYSTYRENE SULFONATE 15 GM/60ML PO SUSP
30.0000 g | Freq: Once | ORAL | Status: AC
  Administered 2013-03-07: 30 g via ORAL
  Filled 2013-03-07 (×2): qty 120

## 2013-03-07 MED ORDER — ONDANSETRON HCL 4 MG/2ML IJ SOLN
4.0000 mg | Freq: Once | INTRAMUSCULAR | Status: AC
  Administered 2013-03-07: 4 mg via INTRAVENOUS
  Filled 2013-03-07: qty 2

## 2013-03-07 MED ORDER — SODIUM CHLORIDE 0.9 % IV SOLN: 100.0000 mL | INTRAVENOUS | Status: DC | PRN

## 2013-03-07 MED ORDER — DOXERCALCIFEROL 4 MCG/2ML IV SOLN
1.0000 ug | Freq: Once | INTRAVENOUS | Status: AC
  Administered 2013-03-08: 1 ug via INTRAVENOUS
  Filled 2013-03-07: qty 2

## 2013-03-07 MED ORDER — HYDROMORPHONE HCL PF 1 MG/ML IJ SOLN
0.5000 mg | INTRAMUSCULAR | Status: AC | PRN
  Administered 2013-03-07: 1 mg via INTRAVENOUS
  Filled 2013-03-07: qty 1

## 2013-03-07 MED ORDER — PIPERACILLIN-TAZOBACTAM IN DEX 2-0.25 GM/50ML IV SOLN
2.2500 g | Freq: Three times a day (TID) | INTRAVENOUS | Status: DC
  Filled 2013-03-07 (×3): qty 50

## 2013-03-07 MED ORDER — SODIUM CHLORIDE 0.9 % IV SOLN
1.0000 g | Freq: Once | INTRAVENOUS | Status: AC
  Administered 2013-03-07: 1 g via INTRAVENOUS
  Filled 2013-03-07: qty 10

## 2013-03-07 MED ORDER — CLONIDINE HCL 0.2 MG PO TABS
0.2000 mg | ORAL_TABLET | Freq: Two times a day (BID) | ORAL | Status: DC
  Administered 2013-03-08 – 2013-03-10 (×5): 0.2 mg via ORAL
  Filled 2013-03-07 (×8): qty 1

## 2013-03-07 MED ORDER — CALCIUM ACETATE 667 MG PO CAPS
667.0000 mg | ORAL_CAPSULE | Freq: Three times a day (TID) | ORAL | Status: DC
  Administered 2013-03-08: 667 mg via ORAL
  Filled 2013-03-07 (×6): qty 1

## 2013-03-07 MED ORDER — HYDROMORPHONE HCL PF 1 MG/ML IJ SOLN
0.5000 mg | Freq: Once | INTRAMUSCULAR | Status: AC
  Administered 2013-03-07: 0.5 mg via INTRAVENOUS
  Filled 2013-03-07: qty 1

## 2013-03-07 MED ORDER — NEPRO/CARBSTEADY PO LIQD: 237.0000 mL | Freq: Two times a day (BID) | ORAL | Status: DC

## 2013-03-07 MED ORDER — RENA-VITE PO TABS
1.0000 | ORAL_TABLET | Freq: Every day | ORAL | Status: DC
  Administered 2013-03-08: 22:00:00 via ORAL
  Administered 2013-03-09: 1 via ORAL
  Filled 2013-03-07 (×4): qty 1

## 2013-03-07 MED ORDER — PANTOPRAZOLE SODIUM 40 MG PO TBEC
40.0000 mg | DELAYED_RELEASE_TABLET | Freq: Every day | ORAL | Status: DC
  Administered 2013-03-08 (×2): 40 mg via ORAL
  Filled 2013-03-07: qty 1

## 2013-03-07 MED ORDER — NEPRO/CARBSTEADY PO LIQD: 237.0000 mL | ORAL | Status: DC | PRN

## 2013-03-07 MED ORDER — ONDANSETRON HCL 4 MG/2ML IJ SOLN: 4.0000 mg | Freq: Three times a day (TID) | INTRAMUSCULAR | Status: AC | PRN

## 2013-03-07 MED ORDER — HEPARIN SODIUM (PORCINE) 1000 UNIT/ML DIALYSIS
1000.0000 [IU] | INTRAMUSCULAR | Status: DC | PRN
  Filled 2013-03-07: qty 1

## 2013-03-07 MED ORDER — INSULIN ASPART 100 UNIT/ML ~~LOC~~ SOLN
0.0000 [IU] | Freq: Three times a day (TID) | SUBCUTANEOUS | Status: DC
  Administered 2013-03-08 – 2013-03-10 (×2): 1 [IU] via SUBCUTANEOUS

## 2013-03-07 MED ORDER — ASPIRIN EC 81 MG PO TBEC
81.0000 mg | DELAYED_RELEASE_TABLET | Freq: Every day | ORAL | Status: DC
  Administered 2013-03-08: 81 mg via ORAL
  Filled 2013-03-07 (×2): qty 1

## 2013-03-07 MED ORDER — BOOST / RESOURCE BREEZE PO LIQD
1.0000 | Freq: Two times a day (BID) | ORAL | Status: DC
  Administered 2013-03-07 – 2013-03-10 (×6): 1 via ORAL

## 2013-03-07 MED ORDER — DEXTROSE 50 % IV SOLN
1.0000 | Freq: Once | INTRAVENOUS | Status: AC
  Administered 2013-03-07: 50 mL via INTRAVENOUS
  Filled 2013-03-07: qty 50

## 2013-03-07 MED ORDER — CALCIUM ACETATE 667 MG PO CAPS
1334.0000 mg | ORAL_CAPSULE | Freq: Two times a day (BID) | ORAL | Status: DC | PRN
  Filled 2013-03-07: qty 2

## 2013-03-07 MED ORDER — ENOXAPARIN SODIUM 30 MG/0.3ML ~~LOC~~ SOLN
30.0000 mg | SUBCUTANEOUS | Status: DC
  Administered 2013-03-07 – 2013-03-10 (×4): 30 mg via SUBCUTANEOUS
  Filled 2013-03-07 (×4): qty 0.3

## 2013-03-07 MED ORDER — ALTEPLASE 2 MG IJ SOLR
2.0000 mg | Freq: Once | INTRAMUSCULAR | Status: AC | PRN
  Filled 2013-03-07: qty 2

## 2013-03-07 MED ORDER — GLUCOSE 40 % PO GEL
ORAL | Status: AC
  Administered 2013-03-07: 37.5 g
  Filled 2013-03-07: qty 1

## 2013-03-07 MED ORDER — DOXERCALCIFEROL 4 MCG/2ML IV SOLN
1.0000 ug | INTRAVENOUS | Status: DC
  Administered 2013-03-09: 1 ug via INTRAVENOUS
  Filled 2013-03-07: qty 2

## 2013-03-07 MED ORDER — IOHEXOL 300 MG/ML  SOLN: 25.0000 mL | INTRAMUSCULAR | Status: AC

## 2013-03-07 MED ORDER — ONDANSETRON HCL 4 MG/2ML IJ SOLN: 4.0000 mg | Freq: Once | INTRAMUSCULAR | Status: AC

## 2013-03-07 MED ORDER — PIPERACILLIN-TAZOBACTAM IN DEX 2-0.25 GM/50ML IV SOLN
2.2500 g | Freq: Three times a day (TID) | INTRAVENOUS | Status: DC
  Administered 2013-03-07 – 2013-03-09 (×4): 2.25 g via INTRAVENOUS
  Filled 2013-03-07 (×7): qty 50

## 2013-03-07 MED ORDER — ONDANSETRON HCL 4 MG/2ML IJ SOLN
INTRAMUSCULAR | Status: AC
  Administered 2013-03-07: 4 mg via INTRAVENOUS
  Filled 2013-03-07: qty 2

## 2013-03-07 MED ORDER — LOSARTAN POTASSIUM 50 MG PO TABS
100.0000 mg | ORAL_TABLET | Freq: Every day | ORAL | Status: DC
  Administered 2013-03-08 – 2013-03-09 (×2): 100 mg via ORAL
  Filled 2013-03-07 (×4): qty 2

## 2013-03-07 MED ORDER — HEPARIN SODIUM (PORCINE) 1000 UNIT/ML DIALYSIS
20.0000 [IU]/kg | INTRAMUSCULAR | Status: DC | PRN
  Filled 2013-03-07: qty 2

## 2013-03-07 NOTE — ED Provider Notes (Signed)
History     CSN: [id number]  Arrival date & time 03/07/13  0453   First MD Initiated Contact with Patient 03/07/13 [phone]      Chief Complaint  Patient presents with  . Abdominal Pain  . Nausea    (Consider location/radiation/quality/duration/timing/severity/associated sxs/prior treatment) HPI [name] is a 79 y.o. female with a history of hypokalemia, peripheral vascular disease, diabetes (not taking any medicine?) and end-stage renal disease who presents with abdominal pain worse in the epigastrium and right upper quadrant. Patient says she typically goes to Monday Wednesday Friday dialysis, went to her Wednesday dialysis and on Thursday started having abdominal pain. Patient says she ate a large meal consisting of boiled cabbage and some spareribs and after this meal she started having her abdominal pain.  Patient had some nausea, no vomiting, last bowel movement was a soft stool on Friday she has had no bowel movements and she says she's not passing gas since then. Mild dizziness, no chest pain, no shortness of breath, no numbness, tingling, weakness. She says her abdominal pain is severe, feels crampy and "like gas." Says the pain is an 8/10. She says the pain was so severe on Friday that she did not go to dialysis. She says that her dialysis center is closed over the weekend. Patient says she still makes urine and denies any dysuria or frequency.   Past Medical History  Diagnosis Date  . Unspecified essential hypertension   . Hyperpotassemia   . Difficulty in walking   . Gout, unspecified 1980's    "not anymore" (11/25/2012)  . Mild mitral valve stenosis   . Joint pain   . Leg pain   . Thyroid disease   . Gangrene     left fifth toe  . Peripheral vascular disease   . Hyperlipidemia   . Aortic valve sclerosis   . Shoulder fracture, right 2012  . Blood transfusion 1960's  . Chronic back pain   . Other specified cardiac dysrhythmias     sees Dr. Angelena Form  . PONV  (postoperative nausea and vomiting)   . Heart murmur     "I think so" (11/25/2012)  . Anginal pain   . Asthma     "used to; not anymore" (11/25/2012)  . Pneumonia     "once; years ago" (11/25/2012)  . Chronic bronchitis     "used to keep it; not anymroe" (11/25/2012)  . Exertional dyspnea     "not anymore" (11/25/2012)  . Type II diabetes mellitus     "states not on any medication at this time"  . Anemia   . GERD (gastroesophageal reflux disease)     "not anymore" (11/25/2012)  . Arthritis     "all over" (11/25/2012)  . ESRD (end stage renal disease) on dialysis     M, W, Fr. [place] dialysis (11/25/2012)    Past Surgical History  Procedure Laterality Date  . Av fistula repair      "right/left arm fistula failed; removed left thigh d/t poor circulation" (11/25/2012)  . Arteriovenous graft placement      left femoral loop arteriovenous Gore-Tex graft.  . Av fistula placement  2008- 2013    "left upper arm; twice in my neck; left leg; removed from left leg; right upper arm" (11/25/2012)  . Foot amputation through metatarsal  06/22/11    "left foot; whole 5th toe" (11/25/2012)  . Pr vein bypass graft,aorto-fem-pop  06/14/2011  . Femoral-popliteal bypass graft  04/11/2012    Procedure:  BYPASS GRAFT FEMORAL-POPLITEAL ARTERY;  Surgeon: [name], MD;  Location: [place];  Service: Vascular;  Laterality: Left;  Thrombectomy/Left femoral-popliteal bypass with revision of proximal end and shortening of graft; intraoperative arteriogram; endarterectomy and patch angioplasty with distal anastomosis  . Appendectomy  1960's  . Refractive surgery      "both eyes" (11/25/2012)  . Femoral-popliteal bypass graft  10/09/2012    Procedure: BYPASS GRAFT FEMORAL-POPLITEAL ARTERY;  Surgeon: [name], MD;  Location: [place];  Service: Vascular;  Laterality: Left;  Redo left tibioperoneal trunk bypass with Gortex Graft 6mmx80cm.  . Kyphoplasty  11/25/2012    balloon, L2, L5  . Total  abdominal hysterectomy  1960's    one ovary removed  . Kyphoplasty  11/25/2012    Procedure: KYPHOPLASTY;  Surgeon: Kristeen Miss, MD;  Location: [place] [facility];  Service: Neurosurgery;  Laterality: N/A;  Lumbar two lumbar five Kyphoplasty  . Colonoscopy  Jan. 28, 2014    Family History  Problem Relation Age of Onset  . Peripheral vascular disease Mother     amputation  . Hypertension Mother   . Diabetes Mother     History  Substance Use Topics  . Smoking status: Former Smoker -- 0.12 packs/day for 15 years    Types: Cigarettes    Quit date: 12/31/1988  . Smokeless tobacco: Never Used  . Alcohol Use: No     Comment: hx of abuse stopped 1990    OB History   Grav Para Term Preterm Abortions TAB SAB Ect Mult Living                  Review of Systems At least 10pt or greater review of systems completed and are negative except where specified in the HPI.  Allergies  Ace inhibitors  Home Medications   Current Outpatient Rx  Name  Route  Sig  Dispense  Refill  . amLODipine (NORVASC) 10 MG tablet   Oral   Take 10 mg by mouth daily.         Marland Kitchen aspirin EC 81 MG tablet   Oral   Take 81 mg by mouth daily.         . calcium acetate (PHOSLO) 667 MG capsule   Oral   Take 667 mg by mouth 3 (three) times daily. 1 tablet with every meal, 2 tabs with snacks         . cloNIDine (CATAPRES) 0.2 MG tablet   Oral   Take 0.2 mg by mouth 2 (two) times daily.         . metoprolol succinate (TOPROL-XL) 50 MG 24 hr tablet   Oral   Take 50 mg by mouth daily. Take with or immediately following a meal.         . pantoprazole (PROTONIX) 40 MG tablet   Oral   Take 40 mg by mouth daily.         . sorbitol 70 % solution   Oral   Take 30 mLs by mouth daily as needed. For bowels         . tiZANidine (ZANAFLEX) 4 MG tablet               . traMADol (ULTRAM) 50 MG tablet   Oral   Take 50-100 mg by mouth every 6 (six) hours as needed. For pain           BP 149/51   Pulse 49  Temp(Src) 97.5 F (36.4 C) (Oral)  Resp  18  SpO2 100%  Physical Exam  Nursing notes reviewed.  Electronic medical record reviewed. VITAL SIGNS:   Filed Vitals:   03/07/13 0505 03/07/13 0630 03/07/13 0730  BP: 149/51 132/78 139/51  Pulse: 49  107  Temp: 97.5 F (36.4 C)    TempSrc: Oral    Resp: 18 11 16   SpO2: 100%  85%   CONSTITUTIONAL: Awake, oriented x4, appears non-toxic HENT: Atraumatic, normocephalic, oral mucosa pink and moist, airway patent. Nares patent without drainage. External ears normal. EYES: Conjunctiva clear, EOMI, PERRLA NECK: Trachea midline, non-tender, supple CARDIOVASCULAR: Bradycardic heart rate, regular rhythm, systolic ejection murmur PULMONARY/CHEST: Clear to auscultation, no rhonchi, wheezes, or rales. Symmetrical breath sounds. Non-tender. ABDOMINAL: Non-distended, mildly protuberant, tenderness to palpation throughout the abdomen worse in the epigastrium and right upper quadrant positive for voluntary guarding, without rebound.  BS normal. Rectal exam: Rectal exam was uncomfortable for the patient, no gross blood, normal tone, no masses or fluctuance, brown stool on the glove NEUROLOGIC: Non-focal, moving all four extremities, no gross sensory or motor deficits. EXTREMITIES: No clubbing, cyanosis, or edema SKIN: Warm, Dry, No erythema, No rash  ED Course  Procedures (including critical care time)  Date: 03/07/2013  Rate: 47  Rhythm: Sinus bradycardia  QRS Axis: normal  Intervals: normal  ST/T Wave abnormalities: unchanged  Conduction Disutrbances: none  Narrative Interpretation: Probable septal infarct seen also on prior EKG dated 11/24/2012, no new ST or T wave abnormalities suggestive of acute ischemia or infarction. No peak T waves.   Labs Reviewed  COMPREHENSIVE METABOLIC PANEL - Abnormal; Notable for the following:    Sodium 131 (*)    Potassium 6.0 (*)    Chloride 91 (*)    Glucose, Bld 133 (*)    BUN 66 (*)     Creatinine, Ser 11.47 (*)    GFR calc non Af Amer 3 (*)    GFR calc Af Amer 3 (*)    All other components within normal limits  CBC WITH DIFFERENTIAL  LIPASE, BLOOD  LACTIC ACID, PLASMA  URINALYSIS, ROUTINE W REFLEX MICROSCOPIC  TROPONIN I  OCCULT BLOOD, POC DEVICE  TYPE AND SCREEN   No results found.   1. Abdominal pain, acute, epigastric   2. Nausea   3. ESRD (end stage renal disease) on dialysis   4. Noncompliance with renal dialysis   5. Hyperkalemia   6. Bradycardia       MDM  [name] is a 79 y.o. female with a history of constipation, hyperkalemia, vasculopathy presents with abdominal pain, and after missing one dialysis appointment.  Patient is bradycardic, this is suggestive of possible hyperkalemia, hyperkalemia may cause ileus as well as her bradycardia.  Obtain basic labs, EKG, also obtain CT of the abdomen and pelvis given the patient's comorbidities and degree of tenderness on physical exam. Her abdomen is nonsurgical however it concerns me that she is generally very tender. Patient is nontoxic and afebrile.  Electrolytes are abnormal as expected, potassium is 6.0, BUN 66 creatinine 11.47 which is over baseline. She is not anemic, she has a normal white count, lactate is within normal limits. Lipase is unremarkable as are liver function tests. EKG is consistent with some hyperkalemia with bradycardia, there are no peak T waves. Patient's occult blood is negative-patient says she may have had some red flecks in her stools but this might of been related to some red pepper she put in her boiled cabbage.  Patient pain is better after medication. Patient  has received calcium gluconate, 10 units of regular insulin with 50 g IV dextrose and 30g kayexalate for K+ reduction.   03/07/2013 8:50 AM Discussed with Dr. Karleen Hampshire for admission.  CT results still pending.      Rhunette Croft, MD 03/07/13 [phone]

## 2013-03-07 NOTE — H&P (Addendum)
[facility] History and Physical  [name] [id number] DOB: 02-18-1934 DOA: 03/07/2013   PCP: Vena Austria, MD  Specialists: Dr Azzie Roup.  Chief Complaint: abdominal pain since Thursday.  HPI: [name] is a 79 y.o. female with prior h/o ESRD on HD, hypertension, diet controlled DM, came in for persistent abdominal pain started in the epigastric area and more generalized now, associated with nausea, novomiting or diarrhea. It started Thursday after a big meal. She missed dialysis on Friday because of the abd pain. On rrival to ED she underwent a CT abd and pelvis, showing small bowel enteritis. She was started on zosyn and is being admitted to medical service for further  Management and nephrology  Consulted for HD today.   Review of Systems: The patient denies anorexia, fever, weight loss,, vision loss, decreased hearing, hoarseness, chest pain, syncope, dyspnea on exertion, peripheral edema, balance deficits, hemoptysis,  melena, hematochezia, severe indigestion/heartburn, hematuria, incontinence, genital sores, muscle weakness, suspicious skin lesions, transient blindness, difficulty walking, depression, unusual weight change, abnormal bleeding, enlarged lymph nodes, angioedema, and breast masses.    Past Medical History  Diagnosis Date  . Unspecified essential hypertension   . Hyperpotassemia   . Difficulty in walking   . Gout, unspecified 1980's    "not anymore" (11/25/2012)  . Mild mitral valve stenosis   . Joint pain   . Leg pain   . Thyroid disease   . Gangrene     left fifth toe  . Peripheral vascular disease   . Hyperlipidemia   . Aortic valve sclerosis   . Shoulder fracture, right 2012  . Blood transfusion 1960's  . Chronic back pain   . Other specified cardiac dysrhythmias     sees Dr. Angelena Form  . PONV (postoperative nausea and vomiting)   . Heart murmur     "I think so" (11/25/2012)  . Anginal pain   . Asthma     "used to; not  anymore" (11/25/2012)  . Pneumonia     "once; years ago" (11/25/2012)  . Chronic bronchitis     "used to keep it; not anymroe" (11/25/2012)  . Exertional dyspnea     "not anymore" (11/25/2012)  . Type II diabetes mellitus     "states not on any medication at this time"  . Anemia   . GERD (gastroesophageal reflux disease)     "not anymore" (11/25/2012)  . Arthritis     "all over" (11/25/2012)  . ESRD (end stage renal disease) on dialysis     M, W, Fr. [place] dialysis (11/25/2012)   Past Surgical History  Procedure Laterality Date  . Av fistula repair      "right/left arm fistula failed; removed left thigh d/t poor circulation" (11/25/2012)  . Arteriovenous graft placement      left femoral loop arteriovenous Gore-Tex graft.  . Av fistula placement  2008- 2013    "left upper arm; twice in my neck; left leg; removed from left leg; right upper arm" (11/25/2012)  . Foot amputation through metatarsal  06/22/11    "left foot; whole 5th toe" (11/25/2012)  . Pr vein bypass graft,aorto-fem-pop  06/14/2011  . Femoral-popliteal bypass graft  04/11/2012    Procedure: BYPASS GRAFT FEMORAL-POPLITEAL ARTERY;  Surgeon: [name], MD;  Location: [place];  Service: Vascular;  Laterality: Left;  Thrombectomy/Left femoral-popliteal bypass with revision of proximal end and shortening of graft; intraoperative arteriogram; endarterectomy and patch angioplasty with distal anastomosis  . Appendectomy  1960's  . Refractive  surgery      "both eyes" (11/25/2012)  . Femoral-popliteal bypass graft  10/09/2012    Procedure: BYPASS GRAFT FEMORAL-POPLITEAL ARTERY;  Surgeon: [name], MD;  Location: [place];  Service: Vascular;  Laterality: Left;  Redo left tibioperoneal trunk bypass with Gortex Graft 34mmx80cm.  . Kyphoplasty  11/25/2012    balloon, L2, L5  . Total abdominal hysterectomy  1960's    one ovary removed  . Kyphoplasty  11/25/2012    Procedure: KYPHOPLASTY;  Surgeon: Kristeen Miss, MD;   Location: [place] [facility];  Service: Neurosurgery;  Laterality: N/A;  Lumbar two lumbar five Kyphoplasty  . Colonoscopy  Jan. 28, 2014   Social History:  reports that she quit smoking about 24 years ago. Her smoking use included Cigarettes. She has a 1.8 pack-year smoking history. She has never used smokeless tobacco. She reports that she does not drink alcohol or use illicit drugs.  where does patient live--home,   Allergies  Allergen Reactions  . Ace Inhibitors Other (See Comments)    Reaction unknown    Family History  Problem Relation Age of Onset  . Peripheral vascular disease Mother     amputation  . Hypertension Mother   . Diabetes Mother     Prior to Admission medications   Medication Sig Start Date End Date Taking? Authorizing Provider  amLODipine (NORVASC) 10 MG tablet Take 10 mg by mouth daily.    Historical Provider, MD  aspirin EC 81 MG tablet Take 81 mg by mouth daily.    Historical Provider, MD  calcium acetate (PHOSLO) 667 MG capsule Take 667 mg by mouth 3 (three) times daily. 1 tablet with every meal, 2 tabs with snacks 09/25/12   Historical Provider, MD  cloNIDine (CATAPRES) 0.2 MG tablet Take 0.2 mg by mouth 2 (two) times daily.    Historical Provider, MD  metoprolol succinate (TOPROL-XL) 50 MG 24 hr tablet Take 50 mg by mouth daily. Take with or immediately following a meal. 04/15/12 04/15/13  Samantha J Rhyne, PA-C  pantoprazole (PROTONIX) 40 MG tablet Take 40 mg by mouth daily.    Historical Provider, MD  sorbitol 70 % solution Take 30 mLs by mouth daily as needed. For bowels    Historical Provider, MD  tiZANidine (ZANAFLEX) 4 MG tablet  12/26/12   Historical Provider, MD  traMADol (ULTRAM) 50 MG tablet Take 50-100 mg by mouth every 6 (six) hours as needed. For pain    Historical Provider, MD   Physical Exam: Filed Vitals:   03/07/13 0505 03/07/13 0630 03/07/13 0730 03/07/13 1001  BP: 149/51 132/78 139/51 165/49  Pulse: 49  107 62  Temp: 97.5 F (36.4 C)      TempSrc: Oral   Oral  Resp: 18 11 16 18   Weight:    60.827 kg (134 lb 1.6 oz)  SpO2: 100%  85% 98%    Constitutional: Vital signs reviewed.  Patient is a well-developed and well-nourished  in mild  distress and cooperative with exam. Alert and oriented x3.  Head: Normocephalic and atraumatic Mouth: no erythema or exudates, MMM Eyes: PERRL, EOMI, conjunctivae normal, No scleral icterus.  Neck: Supple, Trachea midline normal ROM, No JVD, mass, thyromegaly, or carotid bruit present.  Cardiovascular: RRR, S1 normal, S2 normal, no MRG, pulses symmetric and intact bilaterally Pulmonary/Chest: CTAB, no wheezes, rales, or rhonchi Abdominal: Soft. Generalized tenderness.  non-distended, bowel sounds are normal, no masses, organomegaly, or guarding present.  Musculoskeletal: No joint deformities, erythema, or stiffness, ROM full  and no nontender Neurological: A&O x3, Strength is normal and symmetric bilaterally,no focal motor deficit,  Skin: Warm, dry and intact. No rash, cyanosis, or clubbing.  Psychiatric: Normal mood and affect. speech and behavior is normal.    Labs on Admission:  Basic Metabolic Panel:  Recent Labs Lab 03/07/13 0520  NA 131*  K 6.0*  CL 91*  CO2 24  GLUCOSE 133*  BUN 66*  CREATININE 11.47*  CALCIUM 10.1   Liver Function Tests:  Recent Labs Lab 03/07/13 0520  AST 10  ALT 9  ALKPHOS 112  BILITOT 0.3  PROT 8.2  ALBUMIN 3.7    Recent Labs Lab 03/07/13 0520  LIPASE 21   No results found for this basename: AMMONIA,  in the last 168 hours CBC:  Recent Labs Lab 03/07/13 0520  WBC 5.7  NEUTROABS 3.4  HGB 12.8  HCT 37.9  MCV 92.9  PLT 249   Cardiac Enzymes:  Recent Labs Lab 03/07/13 0744  TROPONINI <0.30    BNP (last 3 results) No results found for this basename: PROBNP,  in the last 8760 hours CBG:  Recent Labs Lab 03/07/13 1046  GLUCAP 48*    Radiological Exams on Admission: Ct Abdomen Pelvis Wo Contrast  03/07/2013   *RADIOLOGY REPORT*  Clinical Data: Renal failure, diffuse abdominal pain, nausea  CT ABDOMEN AND PELVIS WITHOUT CONTRAST  Technique:  Multidetector CT imaging of the abdomen and pelvis was performed following the standard protocol without intravenous contrast.  Comparison: 09/09/2012  Findings: Mild dependent atelectasis / subpleural reticulation at the lung bases.  Inferior approach dialysis catheter terminates in the right atrium.  Unenhanced liver, spleen, pancreas, and adrenal glands are within normal limits.  Gallbladder is unremarkable.  No intrahepatic or extrahepatic ductal dilatation.  Bilateral renal atrophy.  No hydronephrosis.  Mildly prominent but nondilated loops of proximal small bowel measuring up to 2.3 cm.  A single loop of unopacified small bowel in the left mid abdomen is mildly thick-walled (series 2/image 43) and is notable for associated mesenteric stranding.  No colonic wall thickening or inflammatory changes.  Atherosclerotic calcifications of the abdominal aorta and branch vessels.  No abdominopelvic ascites.  No suspicious abdominopelvic lymphadenopathy.  Status post hysterectomy.  No adnexal masses.  Bladder is within normal limits.  Small fat-containing right inguinal hernia.  Degenerative changes of the visualized thoracolumbar spine.  Prior vertebral augmentation at L2 and L5.  IMPRESSION: Mild small bowel wall thickening with mesenteric stranding in the left mid abdomen.  In the setting of normal lactic acid, this is most suspicious for infectious/inflammatory enteritis.  No evidence of small bowel obstruction.   Original Report Authenticated By: Julian Hy, M.D.     EKG: sinus bradycardia with t wave abnormalities.   Assessment/Plan Active Problems:    1. Enteritis:  - bowel rest with clear liquid diet. - lactic acid level normal - IV zosyn. - pain control and anti emetics.   2. ESRD on HD:  - renal consulted for HD today.   3. Diet controlled DM: Hgba1c  ordered.  - SSI.   4. Hypertension: resume home medications.   5. DVT prophylaxis  6. Hyperkalemia; possibly from missing HD. She was given kayexalate , calcium gluconate, insulin and dextrose. Plan to repeat labs at the time of HD.   7. COPD; stable.   8. Hyponatremia: mild asymptomatic.        Code Status: full code Family Communication: none at bedside Disposition Plan: possibly in 1 to 2 days.  [facility] Pager [phone]  If 7PM-7AM, please contact night-coverage [email or web address] Password [facility] 03/07/2013, 10:55 AM

## 2013-03-07 NOTE — Consult Note (Signed)
[facility] Renal Consultation Note    Indication for Consultation:  Management of ESRD/hemodialysis; anemia, hypertension/volume and secondary hyperparathyroidism  HPI: [name] is [name] 79 y.o. female with ESRD on MWF dialysis who reported the onset of abdominal pain Thursday.  Pain continued and patient missed her Friday HD treatment. She can't remember exactly when she had her last BM, but thinks it was Friday.  She also lost power Friday am, but was able to go to her daughter's house which had heat.  Due to worsening symptoms she called EMS and came to the hospital. She hasn't eaten much since Thursday.  K in the ED was 6. Hemocult Neg, lipase normal. No fever or leukocytosis, but diffusely tender abdominal. CT showed smal bowel stranding.  She has had nausea, but no vomiting.  Presently feels weak and shaky since coming to 6700 from the ED. Denies CP, SOB.  She has chronic constipation. Only has numbness in left foot where she had the 5th toe removed. Reports she recently had [name] CT which she thought was for her shoulder (Actually was [name] chest CT 2/25 to followup right lung lesions - impression c/w inflammatory process recommended f/u CXR to assure clearance.) She denies any joint pains and makes [name] small amount of urine at times. She wants to eat asking for meat and salad not clear liquids.   Past Medical History  Diagnosis Date  . Unspecified essential hypertension   . Hyperpotassemia   . Difficulty in walking   . Gout, unspecified 1980's    "not anymore" (11/25/2012)  . Mild mitral valve stenosis   . Joint pain   . Leg pain   . Thyroid disease   . Gangrene     left fifth toe  . Peripheral vascular disease   . Hyperlipidemia   . Aortic valve sclerosis   . Shoulder fracture, right 2012  . Blood transfusion 1960's  . Chronic back pain   . Other specified cardiac dysrhythmias     sees Dr. Angelena Form  . PONV (postoperative nausea and vomiting)   . Heart murmur     "I  think so" (11/25/2012)  . Anginal pain   . Asthma     "used to; not anymore" (11/25/2012)  . Pneumonia     "once; years ago" (11/25/2012)  . Chronic bronchitis     "used to keep it; not anymroe" (11/25/2012)  . Exertional dyspnea     "not anymore" (11/25/2012)  . Type II diabetes mellitus     "states not on any medication at this time"  . Anemia   . GERD (gastroesophageal reflux disease)     "not anymore" (11/25/2012)  . Arthritis     "all over" (11/25/2012)  . ESRD (end stage renal disease) on dialysis     M, W, Fr. [facility] dialysis (11/25/2012)   Past Surgical History  Procedure Laterality Date  . Av fistula repair      "right/left arm fistula failed; removed left thigh d/t poor circulation" (11/25/2012)  . Arteriovenous graft placement      left femoral loop arteriovenous Gore-Tex graft.  . Av fistula placement  2008- 2013    "left upper arm; twice in my neck; left leg; removed from left leg; right upper arm" (11/25/2012)  . Foot amputation through metatarsal  06/22/11    "left foot; whole 5th toe" (11/25/2012)  . Pr vein bypass graft,aorto-fem-pop  06/14/2011  . Femoral-popliteal bypass graft  04/11/2012    Procedure: BYPASS GRAFT FEMORAL-POPLITEAL  ARTERY;  Surgeon: [name], MD;  Location: [place];  Service: Vascular;  Laterality: Left;  Thrombectomy/Left femoral-popliteal bypass with revision of proximal end and shortening of graft; intraoperative arteriogram; endarterectomy and patch angioplasty with distal anastomosis  . Appendectomy  1960's  . Refractive surgery      "both eyes" (11/25/2012)  . Femoral-popliteal bypass graft  10/09/2012    Procedure: BYPASS GRAFT FEMORAL-POPLITEAL ARTERY;  Surgeon: [name], MD;  Location: [place];  Service: Vascular;  Laterality: Left;  Redo left tibioperoneal trunk bypass with Gortex Graft 77mmx80cm.  . Kyphoplasty  11/25/2012    balloon, L2, L5  . Total abdominal hysterectomy  1960's    one ovary removed  .  Kyphoplasty  11/25/2012    Procedure: KYPHOPLASTY;  Surgeon: Kristeen Miss, MD;  Location: [facility];  Service: Neurosurgery;  Laterality: N/[name];  Lumbar two lumbar five Kyphoplasty  . Colonoscopy  Jan. 28, 2014   Family History  Problem Relation Age of Onset  . Peripheral vascular disease Mother     amputation  . Hypertension Mother   . Diabetes Mother    Social History: Grandaughter living with her since husband died 01-29-23 of this year, but not she is not there all of the time.  reports that she quit smoking about 24 years ago. Her smoking use included Cigarettes. She has [name] 1.8 pack-year smoking history. She has never used smokeless tobacco. She reports that she does not drink alcohol or use illicit drugs. Allergies  Allergen Reactions  . Ace Inhibitors Other (See Comments)    Reaction unknown   Prior to Admission medications   Medication Sig Start Date End Date Taking? Authorizing Randye Treichler  amLODipine (NORVASC) 10 MG tablet Take 10 mg by mouth daily.    Historical Hadley Soileau, MD  aspirin EC 81 MG tablet Take 81 mg by mouth daily.    Historical Jassiel Flye, MD  calcium acetate (PHOSLO) 667 MG capsule Take 667 mg by mouth 3 (three) times daily. 1 tablet with every meal, 2 tabs with snacks 09/25/12   Historical Wyat Infinger, MD  cloNIDine (CATAPRES) 0.2 MG tablet Take 0.2 mg by mouth 2 (two) times daily.    Historical Wataru Mccowen, MD  metoprolol succinate (TOPROL-XL) 50 MG 24 hr tablet Take 50 mg by mouth daily. Take with or immediately following [name] meal. 04/15/12 04/15/13  Samantha J Rhyne, PA-C  pantoprazole (PROTONIX) 40 MG tablet Take 40 mg by mouth daily.    Historical Fraida Veldman, MD  sorbitol 70 % solution Take 30 mLs by mouth daily as needed. For bowels    Historical Adriona Kaney, MD  tiZANidine (ZANAFLEX) 4 MG tablet  12/26/12   Historical Destanee Bedonie, MD  traMADol (ULTRAM) 50 MG tablet Take 50-100 mg by mouth every 6 (six) hours as needed. For pain    Historical Manjinder Breau, MD   Current  Facility-Administered Medications  Medication Dose Route Frequency [name] Last Rate Last Dose  . aspirin EC tablet 81 mg  81 mg Oral Daily Hosie Poisson, MD      . calcium acetate (PHOSLO) capsule 667 mg  667 mg Oral TID Hosie Poisson, MD      . cloNIDine (CATAPRES) tablet 0.2 mg  0.2 mg Oral BID Hosie Poisson, MD      . doxercalciferol (HECTOROL) injection 1 mcg  1 mcg Intravenous Once Myriam Jacobson, PA-C      . Derrill Memo ON 03/09/2013] doxercalciferol (HECTOROL) injection 1 mcg  1 mcg Intravenous Q M,W,F-HD Myriam Jacobson, PA-C      .  enoxaparin (LOVENOX) injection 30 mg  30 mg Subcutaneous Q24H Hosie Poisson, MD      . feeding supplement (NEPRO CARB STEADY) liquid 237 mL  237 mL Oral BID BM Myriam Jacobson, PA-C      . HYDROmorphone (DILAUDID) injection 0.5-1 mg  0.5-1 mg Intravenous Q4H PRN John-Adam Bonk, MD      . multivitamin (RENA-VIT) tablet 1 tablet  1 tablet Oral Daily Myriam Jacobson, PA-C      . ondansetron (ZOFRAN) injection 4 mg  4 mg Intravenous Q8H PRN John-Adam Bonk, MD      . pantoprazole (PROTONIX) EC tablet 40 mg  40 mg Oral Daily Hosie Poisson, MD      . piperacillin-tazobactam (ZOSYN) IVPB 2.25 g  2.25 g Intravenous Q8H Hosie Poisson, MD       Labs: Basic Metabolic Panel:  Recent Labs Lab 03/07/13 0520  NA 131*  K 6.0*  CL 91*  CO2 24  GLUCOSE 133*  BUN 66*  CREATININE 11.47*  CALCIUM 10.1   Liver Function Tests:  Recent Labs Lab 03/07/13 0520  AST 10  ALT 9  ALKPHOS 112  BILITOT 0.3  PROT 8.2  ALBUMIN 3.7    Recent Labs Lab 03/07/13 0520  LIPASE 21   CBC:  Recent Labs Lab 03/07/13 0520  WBC 5.7  NEUTROABS 3.4  HGB 12.8  HCT 37.9  MCV 92.9  PLT 249   Cardiac Enzymes:  Recent Labs Lab 03/07/13 0744  TROPONINI <0.30  Studies/Results: Ct Abdomen Pelvis Wo Contrast  03/07/2013  *RADIOLOGY REPORT*  Clinical Data: Renal failure, diffuse abdominal pain, nausea  CT ABDOMEN AND PELVIS WITHOUT CONTRAST  Technique:  Multidetector CT  imaging of the abdomen and pelvis was performed following the standard protocol without intravenous contrast.  Comparison: 09/09/2012  Findings: Mild dependent atelectasis / subpleural reticulation at the lung bases.  Inferior approach dialysis catheter terminates in the right atrium.  Unenhanced liver, spleen, pancreas, and adrenal glands are within normal limits.  Gallbladder is unremarkable.  No intrahepatic or extrahepatic ductal dilatation.  Bilateral renal atrophy.  No hydronephrosis.  Mildly prominent but nondilated loops of proximal small bowel measuring up to 2.3 cm.  [name] single loop of unopacified small bowel in the left mid abdomen is mildly thick-walled (series 2/image 43) and is notable for associated mesenteric stranding.  No colonic wall thickening or inflammatory changes.  Atherosclerotic calcifications of the abdominal aorta and branch vessels.  No abdominopelvic ascites.  No suspicious abdominopelvic lymphadenopathy.  Status post hysterectomy.  No adnexal masses.  Bladder is within normal limits.  Small fat-containing right inguinal hernia.  Degenerative changes of the visualized thoracolumbar spine.  Prior vertebral augmentation at L2 and L5.  IMPRESSION: Mild small bowel wall thickening with mesenteric stranding in the left mid abdomen.  In the setting of normal lactic acid, this is most suspicious for infectious/inflammatory enteritis.  No evidence of small bowel obstruction.   Original Report Authenticated By: Julian Hy, M.D.    ROS: Vague historian. Negative except as per HPI  Physical Exam: Filed Vitals:   03/07/13 0505 03/07/13 0630 03/07/13 0730 03/07/13 1001  BP: 149/51 132/78 139/51 165/49  Pulse: 49  107 62  Temp: 97.5 F (36.4 C)     TempSrc: Oral   Oral  Resp: 18 11 16 18   Weight:    60.827 kg (134 lb 1.6 oz)  SpO2: 100%  85% 98%     General: Well developed, slender, in no acute distress. Head: Normocephalic,  atraumatic, sclera non-icteric, mucus membranes are  moist Neck: Supple. JVD not elevated. Lungs: Clear bilaterally to auscultation without wheezes, rales, or rhonchi. Breathing is unlabored. Heart: RRR with S1 S2. No murmurs, rubs, or gallops appreciated. Abdomen: Soft,-mod diffuse tenderness; no rebound or guarding. Lower extremities:without edema or ischemic changes, no open wounds; s/p 5th left toe amputation - well healed Neuro: Alert and oriented X 3. Moves all extremities spontaneously. Psych:  Responds to questions appropriately  Dialysis Access: right fem cath  Dialysis Orders: Center: [facility] MWF - 3 hr 45 min Optiflux 160 400/[name] 1.5 2K 2.25 Ca profile 4 right fem cath EDW 55.5 heparin 10K hectorol 1 Epo 2400 TIW;  Recent labs:  Hgb mid 11s stable iPTH 357 AIC 5.9 in January  Assessment/Plan: 1. Abdominal pain - neg lipase or leukocytosis; CT shows small bowel stranding in LLQ; zosyn started 2. ESRD -  MWF - with hyperkalema - chemically treated in ED; K 6 not that unusual for her; kinetics good. 3. Hypertension/volume  - current meds; volume control 4. Anemia  - Hgb 12.8 - baseline mid 11s; hold ESA today 5. Metabolic bone disease -  P generally controlled; continue hectorol 1 and binders - she takes 3 phoslo with meals when she remembers; will correct orders 6. Nutrition - CL - add resource for kcal - primary to advance diet as tolerated 7. DM - per primary - I asked RN to recheck blood surgar since she had D50 and insulin for ^ K; initial BS 48; given crax and penaut butter when I arrived; recheck now 40; given nepro by staff; following BS closely;    Myriam Jacobson, PA-C [place] [phone] 03/07/2013, 10:18 AM    I have seen and examined this patient and agree with plan as outlined by M. Reinaldo Meeker, PA-C. Pt with h/o noncompliance with HD as well as BP meds and phosphate binders.  Cont with HD qMWF but plan on HD today since she has missed 2 treatments this week.  W/u per primary svc.  Will need to restart  losartan 100mg  qhs as this has been prescribed for the patient several weeks ago due to poor BP control but she never filled the prescription. COLADONATO,Margaret A,MD 03/07/2013 1:03 PM

## 2013-03-07 NOTE — ED Notes (Signed)
Attempted to In and Out cath pt but was unsuccessful. Pt is a dialysis pt and pt stated she does not put out a lot of urine. Unable to obtain specimen.

## 2013-03-07 NOTE — ED Notes (Signed)
Here by EMS, here from home, here for abd pain/ cramping, also reports nausea, (denies: bleeding, vomiting, diarrhea), last BM Friday night (soft), describes as "cramps and feels like gas", rates 8/10. H/o ESRD, access site R thigh cath, last had HD on Wednesday, "did not go Friday d/t not feeling well". Also h/o htn & DM. Alert, NAD, calm, interactive, resps e/u, speaking in clear complete sentences.

## 2013-03-07 NOTE — ED Notes (Signed)
Patient transported to CT 

## 2013-03-07 NOTE — Progress Notes (Signed)
ANTIBIOTIC CONSULT NOTE - INITIAL  Pharmacy Consult for zosyn Indication: Empiric therapy  Allergies  Allergen Reactions  . Ace Inhibitors Other (See Comments)    Reaction unknown    Patient Measurements: Weight: 134 lb 1.6 oz (60.827 kg)  Vital Signs: Temp: 97.5 F (36.4 C) (03/08 0505) Temp src: Oral (03/08 1001) BP: 165/49 mmHg (03/08 1001) Pulse Rate: 62 (03/08 1001) Intake/Output from previous day:   Intake/Output from this shift:    Labs:  Recent Labs  03/07/13 0520  WBC 5.7  HGB 12.8  PLT 249  CREATININE 11.47*   The CrCl is unknown because both a height and weight (above a minimum accepted value) are required for this calculation. No results found for this basename: VANCOTROUGH, VANCOPEAK, VANCORANDOM, GENTTROUGH, GENTPEAK, GENTRANDOM, TOBRATROUGH, TOBRAPEAK, TOBRARND, AMIKACINPEAK, AMIKACINTROU, AMIKACIN,  in the last 72 hours   Microbiology: No results found for this or any previous visit (from the past 720 hour(s)).  Medical History: Past Medical History  Diagnosis Date  . Unspecified essential hypertension   . Hyperpotassemia   . Difficulty in walking   . Gout, unspecified 1980's    "not anymore" (11/25/2012)  . Mild mitral valve stenosis   . Joint pain   . Leg pain   . Thyroid disease   . Gangrene     left fifth toe  . Peripheral vascular disease   . Hyperlipidemia   . Aortic valve sclerosis   . Shoulder fracture, right 2012  . Blood transfusion 1960's  . Chronic back pain   . Other specified cardiac dysrhythmias     sees Dr. Angelena Form  . PONV (postoperative nausea and vomiting)   . Heart murmur     "I think so" (11/25/2012)  . Anginal pain   . Asthma     "used to; not anymore" (11/25/2012)  . Pneumonia     "once; years ago" (11/25/2012)  . Chronic bronchitis     "used to keep it; not anymroe" (11/25/2012)  . Exertional dyspnea     "not anymore" (11/25/2012)  . Type II diabetes mellitus     "states not on any medication at this  time"  . Anemia   . GERD (gastroesophageal reflux disease)     "not anymore" (11/25/2012)  . Arthritis     "all over" (11/25/2012)  . ESRD (end stage renal disease) on dialysis     M, W, Fr. [place] dialysis (11/25/2012)     Assessment: 28 YOF with multiple medical problems including ESRD presents with abdominal pain. Pharmacy is consulted to start zosyn for empiric treatment. Afebrile, wbc wnl, Lipase negative. Her HD schedule is typically MWF, last HD was on 3/5   Plan:  - Zosyn 2.25g IV Q 8 hrs - f/u cultures if available and HD schedule and tolerance  Maryanna Shape, PharmD, BCPS  Clinical Pharmacist  Pager: [phone]   03/07/2013,10:09 AM

## 2013-03-08 ENCOUNTER — Inpatient Hospital Stay (HOSPITAL_COMMUNITY): Payer: Medicare Other

## 2013-03-08 ENCOUNTER — Encounter (HOSPITAL_COMMUNITY): Payer: Self-pay | Admitting: Internal Medicine

## 2013-03-08 DIAGNOSIS — K5289 Other specified noninfective gastroenteritis and colitis: Secondary | ICD-10-CM

## 2013-03-08 DIAGNOSIS — I498 Other specified cardiac arrhythmias: Secondary | ICD-10-CM

## 2013-03-08 DIAGNOSIS — K551 Chronic vascular disorders of intestine: Secondary | ICD-10-CM | POA: Diagnosis present

## 2013-03-08 DIAGNOSIS — R079 Chest pain, unspecified: Secondary | ICD-10-CM

## 2013-03-08 DIAGNOSIS — K529 Noninfective gastroenteritis and colitis, unspecified: Secondary | ICD-10-CM | POA: Diagnosis present

## 2013-03-08 LAB — RENAL FUNCTION PANEL
Albumin: 3.7 g/dL (ref 3.5–5.2)
BUN: 70 mg/dL — ABNORMAL HIGH (ref 6–23)
CO2: 22 mEq/L (ref 19–32)
Calcium: 9.5 mg/dL (ref 8.4–10.5)
Chloride: 90 mEq/L — ABNORMAL LOW (ref 96–112)
Creatinine, Ser: 12.02 mg/dL — ABNORMAL HIGH (ref 0.50–1.10)
GFR calc Af Amer: 3 mL/min — ABNORMAL LOW (ref >90)
GFR calc non Af Amer: 3 mL/min — ABNORMAL LOW (ref >90)
Glucose, Bld: 110 mg/dL — ABNORMAL HIGH (ref 70–99)
Phosphorus: 6.6 mg/dL — ABNORMAL HIGH (ref 2.3–4.6)
Potassium: 5.3 mEq/L — ABNORMAL HIGH (ref 3.5–5.1)
Sodium: 131 mEq/L — ABNORMAL LOW (ref 135–145)

## 2013-03-08 LAB — CBC
HCT: 35.1 % — ABNORMAL LOW (ref 36.0–46.0)
Hemoglobin: 12.1 g/dL (ref 12.0–15.0)
MCH: 31.8 pg (ref 26.0–34.0)
MCHC: 34.5 g/dL (ref 30.0–36.0)
MCV: 92.4 fL (ref 78.0–100.0)
Platelets: 295 10*3/uL (ref 150–400)
RBC: 3.8 MIL/uL — ABNORMAL LOW (ref 3.87–5.11)
RDW: 12.8 % (ref 11.5–15.5)
WBC: 6.9 10*3/uL (ref 4.0–10.5)

## 2013-03-08 LAB — GLUCOSE, CAPILLARY
Glucose-Capillary: 110 mg/dL — ABNORMAL HIGH (ref 70–99)
Glucose-Capillary: 128 mg/dL — ABNORMAL HIGH (ref 70–99)
Glucose-Capillary: 81 mg/dL (ref 70–99)

## 2013-03-08 MED ORDER — CALCIUM ACETATE 667 MG PO CAPS
2001.0000 mg | ORAL_CAPSULE | Freq: Three times a day (TID) | ORAL | Status: DC
  Administered 2013-03-08 – 2013-03-10 (×5): 2001 mg via ORAL
  Filled 2013-03-08 (×9): qty 3

## 2013-03-08 MED ORDER — ZOLPIDEM TARTRATE 5 MG PO TABS: 5.0000 mg | ORAL_TABLET | Freq: Every evening | ORAL | Status: DC | PRN

## 2013-03-08 MED ORDER — HYDROXYZINE HCL 25 MG PO TABS: 25.0000 mg | ORAL_TABLET | Freq: Three times a day (TID) | ORAL | Status: DC | PRN

## 2013-03-08 MED ORDER — IOHEXOL 350 MG/ML SOLN
100.0000 mL | Freq: Once | INTRAVENOUS | Status: AC | PRN
  Administered 2013-03-08: 100 mL via INTRAVENOUS

## 2013-03-08 MED ORDER — ACETAMINOPHEN 325 MG PO TABS
650.0000 mg | ORAL_TABLET | Freq: Four times a day (QID) | ORAL | Status: DC | PRN
  Administered 2013-03-08: 650 mg via ORAL
  Filled 2013-03-08: qty 2

## 2013-03-08 MED ORDER — CAMPHOR-MENTHOL 0.5-0.5 % EX LOTN: 1.0000 "application " | TOPICAL_LOTION | Freq: Three times a day (TID) | CUTANEOUS | Status: DC | PRN

## 2013-03-08 MED ORDER — NEPRO/CARBSTEADY PO LIQD: 237.0000 mL | Freq: Three times a day (TID) | ORAL | Status: DC | PRN

## 2013-03-08 MED ORDER — ASPIRIN 81 MG PO CHEW
81.0000 mg | CHEWABLE_TABLET | Freq: Every day | ORAL | Status: DC
  Administered 2013-03-08 – 2013-03-10 (×3): 81 mg via ORAL
  Filled 2013-03-08 (×4): qty 1

## 2013-03-08 MED ORDER — ONDANSETRON HCL 4 MG PO TABS: 4.0000 mg | ORAL_TABLET | Freq: Four times a day (QID) | ORAL | Status: DC | PRN

## 2013-03-08 MED ORDER — ONDANSETRON HCL 4 MG/2ML IJ SOLN
4.0000 mg | Freq: Four times a day (QID) | INTRAMUSCULAR | Status: DC | PRN
  Administered 2013-03-08: 4 mg via INTRAVENOUS
  Filled 2013-03-08: qty 2

## 2013-03-08 MED ORDER — SORBITOL 70 % SOLN: 30.0000 mL | Status: DC | PRN

## 2013-03-08 MED ORDER — PANTOPRAZOLE SODIUM 40 MG PO TBEC
40.0000 mg | DELAYED_RELEASE_TABLET | Freq: Two times a day (BID) | ORAL | Status: DC
  Administered 2013-03-08 – 2013-03-10 (×5): 40 mg via ORAL
  Filled 2013-03-08 (×4): qty 1

## 2013-03-08 MED ORDER — IOHEXOL 350 MG/ML SOLN: 100.0000 mL | Freq: Once | INTRAVENOUS | Status: DC | PRN

## 2013-03-08 MED ORDER — DOXERCALCIFEROL 4 MCG/2ML IV SOLN
INTRAVENOUS | Status: AC
  Filled 2013-03-08: qty 2

## 2013-03-08 MED ORDER — ACETAMINOPHEN 325 MG PO TABS
ORAL_TABLET | ORAL | Status: AC
  Administered 2013-03-08: 650 mg via ORAL
  Filled 2013-03-08: qty 2

## 2013-03-08 MED ORDER — DOCUSATE SODIUM 283 MG RE ENEM: 1.0000 | ENEMA | RECTAL | Status: DC | PRN

## 2013-03-08 MED ORDER — ACETAMINOPHEN 650 MG RE SUPP: 650.0000 mg | Freq: Four times a day (QID) | RECTAL | Status: DC | PRN

## 2013-03-08 MED ORDER — CALCIUM CARBONATE 1250 MG/5ML PO SUSP: 500.0000 mg | Freq: Four times a day (QID) | ORAL | Status: DC | PRN

## 2013-03-08 NOTE — Progress Notes (Signed)
[facility] Progress Note  Subjective:  Belly is still "sore on the inside". Would like to go home. Dissatisfied with clear liquids and wants meat.  Objective Filed Vitals:   03/08/13 0430 03/08/13 0500 03/08/13 0512 03/08/13 0606  BP: 152/65 126/62 138/61 156/70  Pulse: 79 78 79 69  Temp:   98 F (36.7 C) 98.6 F (37 C)  TempSrc:   Oral Oral  Resp: 12 12 12 15   Height:      Weight:   56.6 kg (124 lb 12.5 oz)   SpO2:    100%   Physical Exam General: Resting in bed. NAD Heart: RRR Lungs: Clear bilaterally. No wheezes, rales or rhonchi noted. Abdomen: Soft, diffusely tender to light palp, + guarding, normal BS Extremities: No LE edema Dialysis Access: Rt fem cath  Dialysis Orders: Center: [facility] MWF - 3 hr 45 min Optiflux 160 400/A 1.5 2K 2.25 Ca profile 4 right fem cath EDW 55.5 heparin 10K hectorol 1 Epo 2400 TIW; Recent labs: Hgb mid 11s stable iPTH 357 AIC 5.9 in January   Assessment/Plan: 1. Abdominal pain/Enteritis - neg lipase or leukocytosis; lactic acid nl, CT shows small bowel stranding in LLQ; Bowel rest, clears, IV zosyn and pain control per primary. 2. ESRD - MWF - Hyperkalemia resolved. HD tomorrow on normal schedule 3. Hypertension/volume - SBPs 120s - 150s. Continue current meds;  EDW seems ok 4. Anemia - Hgb 12.8 - baseline mid 11s; hold ESA for now 5. Metabolic bone disease - Corrected Ca 9.4. P 6.6; Takes 3 phoslo with meals/ 2 with snacks when she remembers. Continue Hectorol 1 and encourage binders 6. Nutrition - albumin 3.7 - On CL's and Breeze supplement - primary to advance diet as tolerated 7. DM - BG's improving.    Collene Leyden. Rhodia Albright [facility] [phone] pager 03/08/2013,11:15 AM  LOS: 1 day    Additional Objective Labs: Basic Metabolic Panel:  Recent Labs Lab 03/07/13 0520 03/08/13 0045  NA 131* 131*  K 6.0* 5.3*  CL 91* 90*  CO2 24 22  GLUCOSE 133* 110*  BUN 66* 70*  CREATININE 11.47* 12.02*  CALCIUM  10.1 9.5  PHOS  --  6.6*   Liver Function Tests:  Recent Labs Lab 03/07/13 0520 03/08/13 0045  AST 10  --   ALT 9  --   ALKPHOS 112  --   BILITOT 0.3  --   PROT 8.2  --   ALBUMIN 3.7 3.7    Recent Labs Lab 03/07/13 0520  LIPASE 21   CBC:  Recent Labs Lab 03/07/13 0520 03/08/13 0045  WBC 5.7 6.9  NEUTROABS 3.4  --   HGB 12.8 12.1  HCT 37.9 35.1*  MCV 92.9 92.4  PLT 249 295   Cardiac Enzymes:  Recent Labs Lab 03/07/13 0744  TROPONINI <0.30   CBG:  Recent Labs Lab 03/07/13 1258 03/07/13 1322 03/07/13 1726 03/07/13 2146 03/08/13 0803  GLUCAP 65* 85 148* 113* 110*   Studies/Results: Ct Abdomen Pelvis Wo Contrast  03/07/2013  *RADIOLOGY REPORT*  Clinical Data: Renal failure, diffuse abdominal pain, nausea  CT ABDOMEN AND PELVIS WITHOUT CONTRAST  Technique:  Multidetector CT imaging of the abdomen and pelvis was performed following the standard protocol without intravenous contrast.  Comparison: 09/09/2012  Findings: Mild dependent atelectasis / subpleural reticulation at the lung bases.  Inferior approach dialysis catheter terminates in the right atrium.  Unenhanced liver, spleen, pancreas, and adrenal glands are within normal limits.  Gallbladder is unremarkable.  No intrahepatic or extrahepatic ductal dilatation.  Bilateral renal atrophy.  No hydronephrosis.  Mildly prominent but nondilated loops of proximal small bowel measuring up to 2.3 cm.  A single loop of unopacified small bowel in the left mid abdomen is mildly thick-walled (series 2/image 43) and is notable for associated mesenteric stranding.  No colonic wall thickening or inflammatory changes.  Atherosclerotic calcifications of the abdominal aorta and branch vessels.  No abdominopelvic ascites.  No suspicious abdominopelvic lymphadenopathy.  Status post hysterectomy.  No adnexal masses.  Bladder is within normal limits.  Small fat-containing right inguinal hernia.  Degenerative changes of the visualized  thoracolumbar spine.  Prior vertebral augmentation at L2 and L5.  IMPRESSION: Mild small bowel wall thickening with mesenteric stranding in the left mid abdomen.  In the setting of normal lactic acid, this is most suspicious for infectious/inflammatory enteritis.  No evidence of small bowel obstruction.   Original Report Authenticated By: Julian Hy, M.D.    Medications:   . aspirin EC  81 mg Oral Daily  . calcium acetate  2,001 mg Oral TID WC  . cloNIDine  0.2 mg Oral BID  . doxercalciferol      . [START ON 03/09/2013] doxercalciferol  1 mcg Intravenous Q M,W,F-HD  . enoxaparin (LOVENOX) injection  30 mg Subcutaneous Q24H  . feeding supplement  1 Container Oral BID BM  . insulin aspart  0-9 Units Subcutaneous TID WC  . losartan  100 mg Oral QHS  . multivitamin  1 tablet Oral QHS  . pantoprazole  40 mg Oral Daily  . piperacillin-tazobactam (ZOSYN)  IV  2.25 g Intravenous Q8H   I have seen and examined this patient and agree with plan as outlined by K. Warren, PA-C.  BP markedly improved with home regimen. Stress compliance with meds once she is able to go home.  Cont HD qMWF while and inpt. COLADONATO,JOSEPH A,MD 03/08/2013 12:32 PM

## 2013-03-08 NOTE — Progress Notes (Signed)
CT angio reviewed.  Films also discussed with Dr Pascal Lux.  Has some element of mesenteric vascular disease but probably only 50% stenosis of celiac and SMA.  Bowel appears improved.  Would recommend bowel rest for now Consider slowly advancing diet over the next few days.  She may have had relative gut hypoperfusion on dialysis. Her anatomy is not amenable to percutaneous or open revascularization secondary to heavy circumferential calcification of the mesenteric distal targets as well as inflow from the aorta or iliac arteries.  Will sign off  Ruta Hinds, MD [facility] Office: [phone] Pager: [phone]
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
## 2013-03-08 NOTE — Progress Notes (Signed)
Patient has 3 rings, necklace, and watch.  She also has her cell phone and wallet with money.  She does not want her valuable to go to the safe in [place].  Advised patient of policy about valuables and she understands.  No family currently available to take belongings home.  She feels she is going home with daughter today.  Will continue to monitor patient.  Chandell Attridge, Thea Gist

## 2013-03-08 NOTE — Progress Notes (Signed)
Patient's daughter, Glori Luis, called and expressed concerns that patient had a few sick contacts.  Daughter states that patient's granddaughter had vomiting and diarrhea recently.  Daughter also endorses diarrhea, but no vomiting.  MD notified of this discussion.  MD advises no need for contact precautions at this time since patient is denying having had any vomiting or diarrhea.  Patient's chief complaint has been abdominal pain.  Will continue to monitor.

## 2013-03-08 NOTE — Consult Note (Signed)
[facility] Consultation  Requesting Provider: Hospitalist Primary Care Physician:  Vena Austria, MD Primary Gastroenterologist:  Dr. Leonie Douglas  Reason for Consultation:   Abdominal pain and thickend small bowel on CT Assessment:   Findings seem quite likely to be from ischemic injury to small intestine - non-contrast CT shows very calcified aorta and main mesenteric vessels - images viewed.      Recommendations: Would get opinion from vascular surgery re: is there any role for further imaging.    Please call back GI if needed but no further role for [place] that I see. Would likely ask dr. Oletta Lamas or colleagues to see as she says she has her colonoscopies done by Dr. Oletta Lamas.  I appreciate the opportunity to care for this patient.    HPI: [name] is a 79 y.o. female admitted with abdominal pain. She was ok until 2-3 days ago when she developed onset of left mid and periumbilical abdominal pain. She had eaten a large meal on 3/ 6. HD on 3/5 was ok. She does report some abdominal pain with HD at times. No nausea or vomiting. Pain was enough that she skipped HD fri 3/7. Ct showed thickened small bowel. She remains in pain and tender. No diarrhea or bleeding. Did have loose stools after Ct oral contrast.  She wants meat, not liquids. Denises similar pain in past. GI ROS o/w negative   Past Medical History  Diagnosis Date  . Unspecified essential hypertension   . Hyperpotassemia   . Difficulty in walking   . Gout, unspecified 1980's    "not anymore" (11/25/2012)  . Mild mitral valve stenosis   . Joint pain   . Leg pain   . Thyroid disease   . Gangrene     left fifth toe  . Peripheral vascular disease   . Hyperlipidemia   . Aortic valve sclerosis   . Shoulder fracture, right 2012  . Blood transfusion 1960's  . Chronic back pain   . Other specified cardiac dysrhythmias     sees Dr. Angelena Form  . PONV (postoperative nausea and vomiting)   . Heart murmur      "I think so" (11/25/2012)  . Anginal pain   . Asthma     "used to; not anymore" (11/25/2012)  . Pneumonia     "once; years ago" (11/25/2012)  . Chronic bronchitis     "used to keep it; not anymroe" (11/25/2012)  . Exertional dyspnea     "not anymore" (11/25/2012)  . Type II diabetes mellitus     "states not on any medication at this time"  . Anemia   . GERD (gastroesophageal reflux disease)     "not anymore" (11/25/2012)  . Arthritis     "all over" (11/25/2012)  . ESRD (end stage renal disease) on dialysis     M, W, [facility] (11/25/2012)    Past Surgical History  Procedure Laterality Date  . Av fistula repair      "right/left arm fistula failed; removed left thigh d/t poor circulation" (11/25/2012)  . Arteriovenous graft placement      left femoral loop arteriovenous Gore-Tex graft.  . Av fistula placement  2008- 2013    "left upper arm; twice in my neck; left leg; removed from left leg; right upper arm" (11/25/2012)  . Foot amputation through metatarsal  06/22/11    "left foot; whole 5th toe" (11/25/2012)  . Pr vein bypass graft,aorto-fem-pop  06/14/2011  . Femoral-popliteal bypass graft  04/11/2012  Procedure: BYPASS GRAFT FEMORAL-POPLITEAL ARTERY;  Surgeon: [name], MD;  Location: [facility];  Service: Vascular;  Laterality: Left;  Thrombectomy/Left femoral-popliteal bypass with revision of proximal end and shortening of graft; intraoperative arteriogram; endarterectomy and patch angioplasty with distal anastomosis  . Appendectomy  1960's  . Refractive surgery      "both eyes" (11/25/2012)  . Femoral-popliteal bypass graft  10/09/2012    Procedure: BYPASS GRAFT FEMORAL-POPLITEAL ARTERY;  Surgeon: [name], MD;  Location: [facility];  Service: Vascular;  Laterality: Left;  Redo left tibioperoneal trunk bypass with Gortex Graft 44mmx80cm.  . Kyphoplasty  11/25/2012    balloon, L2, L5  . Total abdominal hysterectomy  1960's    one ovary removed  .  Kyphoplasty  11/25/2012    Procedure: KYPHOPLASTY;  Surgeon: Kristeen Miss, MD;  Location: [facility] [place] [place] [facility];  Service: Neurosurgery;  Laterality: N/A;  Lumbar two lumbar five Kyphoplasty  . Colonoscopy      Prior to Admission medications   Medication Sig Start Date End Date Taking? Authorizing Provider  amLODipine (NORVASC) 10 MG tablet Take 10 mg by mouth daily.   Yes Historical Provider, MD  aspirin EC 81 MG tablet Take 81 mg by mouth daily.   Yes Historical Provider, MD  calcium acetate (PHOSLO) 667 MG capsule Take 667 mg by mouth 3 (three) times daily. 1 tablet with every meal, 2 tabs with snacks 09/25/12  Yes Historical Provider, MD  cloNIDine (CATAPRES) 0.2 MG tablet Take 0.2 mg by mouth 2 (two) times daily.   Yes Historical Provider, MD  losartan (COZAAR) 100 MG tablet Take 100 mg by mouth at bedtime.   Yes Historical Provider, MD  metoprolol succinate (TOPROL-XL) 50 MG 24 hr tablet Take 50 mg by mouth daily. Take with or immediately following a meal. 04/15/12 04/15/13 Yes Samantha J Rhyne, PA-C  pantoprazole (PROTONIX) 40 MG tablet Take 40 mg by mouth daily.   Yes Historical Provider, MD  traMADol (ULTRAM) 50 MG tablet Take 50-100 mg by mouth every 6 (six) hours as needed. For pain   Yes Historical Provider, MD    Current Facility-Administered Medications  Medication Dose Route Frequency Provider Last Rate Last Dose  . 0.9 %  sodium chloride infusion  100 mL Intravenous PRN Myriam Jacobson, PA-C      . 0.9 %  sodium chloride infusion  100 mL Intravenous PRN Myriam Jacobson, PA-C      . acetaminophen (TYLENOL) tablet 650 mg  650 mg Oral Q6H PRN Windy Kalata, MD   650 mg at 03/08/13 0132  . aspirin chewable tablet 81 mg  81 mg Oral Daily Thurnell Lose, MD   81 mg at 03/08/13 1418  . calcium acetate (PHOSLO) capsule 1,334 mg  1,334 mg Oral BID BM PRN Hosie Poisson, MD      . calcium acetate (PHOSLO) capsule 2,001 mg  2,001 mg Oral TID WC Alvia Grove, PA-C   2,001 mg at 03/08/13  1201  . calcium carbonate (dosed in mg elemental calcium) suspension 500 mg of elemental calcium  500 mg of elemental calcium Oral Q6H PRN Windy Kalata, MD      . camphor-menthol [facility]) lotion 1 application  1 application Topical [id number] PRN Windy Kalata, MD       And  . hydrOXYzine (ATARAX/VISTARIL) tablet 25 mg  25 mg Oral Q8H PRN Windy Kalata, MD      . cloNIDine (CATAPRES) tablet 0.2 mg  0.2 mg  Oral BID Hosie Poisson, MD   0.2 mg at 03/08/13 1201  . docusate sodium (ENEMEEZ) enema 283 mg  1 enema Rectal PRN Windy Kalata, MD      . doxercalciferol (HECTOROL) 4 MCG/2ML injection           . [START ON 03/09/2013] doxercalciferol (HECTOROL) injection 1 mcg  1 mcg Intravenous Q M,W,F-HD Myriam Jacobson, PA-C      . enoxaparin (LOVENOX) injection 30 mg  30 mg Subcutaneous Q24H Hosie Poisson, MD   30 mg at 03/08/13 1201  . feeding supplement (NEPRO CARB STEADY) liquid 237 mL  237 mL Oral PRN Myriam Jacobson, PA-C      . feeding supplement (NEPRO CARB STEADY) liquid 237 mL  237 mL Oral TID PRN Windy Kalata, MD      . feeding supplement (RESOURCE BREEZE) liquid 1 Container  1 Container Oral BID BM Myriam Jacobson, PA-C   1 Container at 03/08/13 1419  . heparin injection 1,000 Units  1,000 Units Dialysis PRN Myriam Jacobson, PA-C      . heparin injection 1,200 Units  20 Units/kg Dialysis PRN Myriam Jacobson, PA-C      . insulin aspart (novoLOG) injection 0-9 Units  0-9 Units Subcutaneous TID WC Hosie Poisson, MD   1 Units at 03/08/13 1248  . losartan (COZAAR) tablet 100 mg  100 mg Oral QHS Donetta Potts, MD      . multivitamin (RENA-VIT) tablet 1 tablet  1 tablet Oral QHS Myriam Jacobson, PA-C      . ondansetron [facility]) tablet 4 mg  4 mg Oral Q6H PRN Windy Kalata, MD       Or  . ondansetron [facility]) injection 4 mg  4 mg Intravenous Q6H PRN Windy Kalata, MD   4 mg at 03/08/13 1610  . pantoprazole (PROTONIX) EC tablet 40 mg  40 mg Oral BID  Thurnell Lose, MD   40 mg at 03/08/13 1419  . piperacillin-tazobactam (ZOSYN) IVPB 2.25 g  2.25 g Intravenous Q8H Hosie Poisson, MD   2.25 g at 03/08/13 1200  . sorbitol 70 % solution 30 mL  30 mL Oral PRN Windy Kalata, MD      . zolpidem [facility]) tablet 5 mg  5 mg Oral QHS PRN Windy Kalata, MD        Allergies as of 03/07/2013 - Review Complete 03/07/2013  Allergen Reaction Noted  . Ace inhibitors Other (See Comments) 02/25/2012    Family History  Problem Relation Age of Onset  . Peripheral vascular disease Mother     amputation  . Hypertension Mother   . Diabetes Mother     History   Social History  . Marital Status: Married    Spouse Name: N/A    Number of Children: 2    Social History Main Topics  . Smoking status: Former Smoker -- 0.12 packs/day for 15 years    Types: Cigarettes    Quit date: 12/31/1988  . Smokeless tobacco: Never Used  . Alcohol Use: No     Comment: hx of abuse stopped 1990  . Drug Use: No     Comment: 11/25/2012 "tried marijuana in the 1990's; couldn't handle it"  . Sexually Active: No          Social History Narrative   Married. 2 children. Oldest died in 04/04/99.    Review of Systems: Positive for HPI items, chronically weak and frail. All other  review of systems negative except as mentioned in the HPI.  Physical Exam: Vital signs in last 24 hours: Temp:  [98 F (36.7 C)-98.7 F (37.1 C)] 98.7 F (37.1 C) (03/09 1614) Pulse Rate:  [61-79] 69 (03/09 1614) Resp:  [10-18] 18 (03/09 1434) BP: (126-186)/(44-73) 138/54 mmHg (03/09 1614) SpO2:  [98 %-100 %] 98 % (03/09 1614) Weight:  [124 lb 12.5 oz (56.6 kg)-135 lb 8 oz (61.462 kg)] 124 lb 12.5 oz (56.6 kg) (03/09 0512) Last BM Date: 03/08/13 General:   Alert,   pleasant and cooperative in NAD - chronically ill Eyes:  Sclera clear, no icterus. + arcus   Lungs:  Clear throughout to auscultation - anterior Heart:  Regular rate and rhythm; no murmurs, clicks, rubs,  or  gallops. Abdomen:  Soft, tender moderately in left mid quadrant with some guarding  No masses, hepatosplenomegaly or hernias noted. Normal to increased  bowel sounds  Extremities:  Without edema. , old fistulae UE's, R groin dialysis catheter Neurologic:  Alert and  oriented x 3 Skin:  dry Lymph Nodes:  No significant cervical or supraclavicular adenopathy. Psych:  Alert and cooperative. Normal mood and affect.    Lab Results:  Recent Labs  03/07/13 0520 03/08/13 0045  WBC 5.7 6.9  HGB 12.8 12.1  HCT 37.9 35.1*  PLT 249 295   BMET  Recent Labs  03/07/13 0520 03/08/13 0045  NA 131* 131*  K 6.0* 5.3*  CL 91* 90*  CO2 24 22  GLUCOSE 133* 110*  BUN 66* 70*  CREATININE 11.47* 12.02*  CALCIUM 10.1 9.5   LFT  Recent Labs  03/07/13 0520 03/08/13 0045  PROT 8.2  --   ALBUMIN 3.7 3.7  AST 10  --   ALT 9  --   ALKPHOS 112  --   BILITOT 0.3  --      Studies/Results: Images viewed with radiologist Ct Abdomen Pelvis Wo Contrast  03/07/2013  *RADIOLOGY REPORT*  Clinical Data: Renal failure, diffuse abdominal pain, nausea  CT ABDOMEN AND PELVIS WITHOUT CONTRAST  Technique:  Multidetector CT imaging of the abdomen and pelvis was performed following the standard protocol without intravenous contrast.  Comparison: 09/09/2012  Findings: Mild dependent atelectasis / subpleural reticulation at the lung bases.  Inferior approach dialysis catheter terminates in the right atrium.  Unenhanced liver, spleen, pancreas, and adrenal glands are within normal limits.  Gallbladder is unremarkable.  No intrahepatic or extrahepatic ductal dilatation.  Bilateral renal atrophy.  No hydronephrosis.  Mildly prominent but nondilated loops of proximal small bowel measuring up to 2.3 cm.  A single loop of unopacified small bowel in the left mid abdomen is mildly thick-walled (series 2/image 43) and is notable for associated mesenteric stranding.  No colonic wall thickening or inflammatory changes.   Atherosclerotic calcifications of the abdominal aorta and branch vessels.  No abdominopelvic ascites.  No suspicious abdominopelvic lymphadenopathy.  Status post hysterectomy.  No adnexal masses.  Bladder is within normal limits.  Small fat-containing right inguinal hernia.  Degenerative changes of the visualized thoracolumbar spine.  Prior vertebral augmentation at L2 and L5.  IMPRESSION: Mild small bowel wall thickening with mesenteric stranding in the left mid abdomen.  In the setting of normal lactic acid, this is most suspicious for infectious/inflammatory enteritis.  No evidence of small bowel obstruction.   Original Report Authenticated By: Julian Hy, M.D.    Dg Chest 2 View  03/08/2013  *RADIOLOGY REPORT*  Clinical Data: Hypoxia.  CHEST - 2 VIEW  Comparison: CT chest 02/24/2013. Plain film of the chest 11/25/2012.  Findings: Double-lumen central venous catheter is unchanged in position with its tips in the right atrium.  Small focus of peripheral airspace opacity is identified in the right lower lobe and appears unchanged compared to prior CT.  Left lung is clear. Heart size is normal.  No pneumothorax or pleural effusion.  IMPRESSION:  Peripheral right lower lobe airspace opacity is seen as on prior CT.  The finding cannot be definitively characterized and could be due to focal inflammatory process but repeat PA and lateral films in two-to-three weeks are recommended to ensure clearing.   Original Report Authenticated By: Orlean Patten, M.D.      Previous Endoscopies:  Reports prior colonoscopies by Dr. Oletta Lamas    LOS: 1 day      @Carl  Simonne Maffucci, MD, [facility] [phone] (pager) 03/08/2013 4:41 PM@

## 2013-03-08 NOTE — Progress Notes (Signed)
Attempt PIV insertion with ultrasound guidance.  No suitable veins Left arm.  Attempt x 1 right arm unsuccessful.  No other suitable veins right arm.   Floor RN to inform MD.

## 2013-03-08 NOTE — Progress Notes (Signed)
[facility]                                                                                Patient Demographics  [name], is a 79 y.o. female, DOB - [birth year], VC:[id number], [place] date - 03/07/2013  Admitting Physician Hosie Poisson, MD  Outpatient Primary MD for the patient is Vena Austria, MD  LOS - 1   Chief Complaint  Patient presents with  . Abdominal Pain  . Nausea        Assessment & Plan   Summary  [name] is a 79 y.o. female with prior h/o ESRD on HD, hypertension, diet controlled DM, came in for persistent abdominal pain started in the epigastric area and more generalized now, patient says she did not have  nausea, novomiting or diarrhea. It started Thursday after a big meal. She missed dialysis on Friday because of the abd pain. On rrival to ED she underwent a CT abd and pelvis, showing small bowel enteritis. She was started on zosyn and is being admitted to medical service for further Management and nephrology Consulted for HD .   1. Epigastric abdominal pain, no history of nausea vomiting or diarrhea. CT scan with suggestion of small bowel enteritis, patient was started on Zosyn upon admission which is being continued, continue clear liquid diet, continue PPI, we'll request [facility] to take a look I question if this is ischemic entritis.   2. ESRD. Renal following for dialysis per home schedule.   3. Diabetes mellitus type 2. Patient on diet control, continue sliding scale insulin if needed  Lab Results  Component Value Date   HGBA1C 5.9* 11/25/2012    CBG (last 3)   Recent Labs  03/07/13 2146 03/08/13 0803 03/08/13 1227  GLUCAP 113* 110* 128*    4. Hypertension stable continue home medications clonidine and Cozaar and monitor.   5. COPD stable no acute issues.   6. Will defer electrolyte abnormalities of hyperkalemia and hyponatremia to renal.   Code Status: Full  Family Communication:  patientas no family around  Disposition Plan: Home   Procedures CT Abd -Pelvis = showing small bowel antritis   Consults  Renal   DVT Prophylaxis  Lovenox    Lab Results  Component Value Date   PLT 295 03/08/2013    Medications  Scheduled Meds: . aspirin  81 mg Oral Daily  . calcium acetate  2,001 mg Oral TID WC  . cloNIDine  0.2 mg Oral BID  . doxercalciferol      . [START ON 03/09/2013] doxercalciferol  1 mcg Intravenous Q M,W,F-HD  . enoxaparin (LOVENOX) injection  30 mg Subcutaneous Q24H  . feeding supplement  1 Container Oral BID BM  . insulin aspart  0-9 Units Subcutaneous TID WC  . losartan  100 mg Oral QHS  . multivitamin  1 tablet Oral QHS  . pantoprazole  40 mg Oral BID  . piperacillin-tazobactam (ZOSYN)  IV  2.25 g Intravenous Q8H   Continuous Infusions:  PRN Meds:.sodium chloride, sodium chloride, acetaminophen, calcium acetate, calcium carbonate (dosed in mg elemental calcium), camphor-menthol, docusate sodium, feeding supplement (NEPRO CARB STEADY), feeding supplement (NEPRO CARB  STEADY), heparin, heparin, hydrOXYzine, ondansetron (ZOFRAN) IV, ondansetron, sorbitol, zolpidem  Antibiotics     Anti-infectives   Start     Dose/Rate Route Frequency Ordered Stop   03/07/13 1800  piperacillin-tazobactam (ZOSYN) IVPB 2.25 g     2.25 g 100 mL/hr over 30 Minutes Intravenous Every 8 hours 03/07/13 1748     03/07/13 1400  piperacillin-tazobactam (ZOSYN) IVPB 2.25 g  Status:  Discontinued     2.25 g 100 mL/hr over 30 Minutes Intravenous 3 times per day 03/07/13 1015 03/07/13 1748       Time Spent in minutes   35   SINGH,PRASHANT K M.D on 03/08/2013 at 1:25 PM  Between 7am to 7pm - Pager - [phone]  After 7pm go to [email or web address] - password [email or web address]  And look for the night coverage person covering for me after hours  [facility] Office  [phone]    Subjective:   [name] today has, No headache, No chest pain, improved abdominal pain -  No Nausea, No new weakness tingling or numbness, No Cough - SOB.    Objective:   Filed Vitals:   03/08/13 0430 03/08/13 0500 03/08/13 0512 03/08/13 0606  BP: 152/65 126/62 138/61 156/70  Pulse: 79 78 79 69  Temp:   98 F (36.7 C) 98.6 F (37 C)  TempSrc:   Oral Oral  Resp: 12 12 12 15   Height:      Weight:   56.6 kg (124 lb 12.5 oz)   SpO2:    100%    Wt Readings from Last 3 Encounters:  03/08/13 56.6 kg (124 lb 12.5 oz)  02/20/13 56.246 kg (124 lb)  02/03/13 57.063 kg (125 lb 12.8 oz)     Intake/Output Summary (Last 24 hours) at 03/08/13 1325 Last data filed at 03/08/13 0512  Gross per 24 hour  Intake    240 ml  Output   2205 ml  Net  -1965 ml    Exam Awake Alert, Oriented X 3, No new F.N deficits, Normal affect [place].AT,PERRAL Supple Neck,No JVD, No cervical lymphadenopathy appriciated.  Symmetrical Chest wall movement, Good air movement bilaterally, CTAB RRR,No Gallops,Rubs or new Murmurs, No Parasternal Heave +ve B.Sounds, Abd Soft, mild periumblical tenderness, No organomegaly appriciated, No rebound - guarding or rigidity. No Cyanosis, Clubbing or edema, No new Rash or bruise      Data Review   Micro Results Recent Results (from the past 240 hour(s))  MRSA PCR SCREENING     Status: None   Collection Time    03/07/13  9:00 PM      Result Value Range Status   MRSA by PCR NEGATIVE  NEGATIVE Final   Comment:            The GeneXpert MRSA Assay (FDA     approved for NASAL specimens     only), is one component of a     comprehensive MRSA colonization     surveillance program. It is not     intended to diagnose MRSA     infection nor to guide or     monitor treatment for     MRSA infections.    Radiology Reports Ct Abdomen Pelvis Wo Contrast  03/07/2013  *RADIOLOGY REPORT*  Clinical Data: Renal failure, diffuse abdominal pain, nausea  CT ABDOMEN AND PELVIS WITHOUT CONTRAST  Technique:  Multidetector CT imaging of the abdomen and pelvis was performed  following the standard protocol without intravenous contrast.  Comparison: 09/09/2012  Findings: Mild dependent  atelectasis / subpleural reticulation at the lung bases.  Inferior approach dialysis catheter terminates in the right atrium.  Unenhanced liver, spleen, pancreas, and adrenal glands are within normal limits.  Gallbladder is unremarkable.  No intrahepatic or extrahepatic ductal dilatation.  Bilateral renal atrophy.  No hydronephrosis.  Mildly prominent but nondilated loops of proximal small bowel measuring up to 2.3 cm.  A single loop of unopacified small bowel in the left mid abdomen is mildly thick-walled (series 2/image 43) and is notable for associated mesenteric stranding.  No colonic wall thickening or inflammatory changes.  Atherosclerotic calcifications of the abdominal aorta and branch vessels.  No abdominopelvic ascites.  No suspicious abdominopelvic lymphadenopathy.  Status post hysterectomy.  No adnexal masses.  Bladder is within normal limits.  Small fat-containing right inguinal hernia.  Degenerative changes of the visualized thoracolumbar spine.  Prior vertebral augmentation at L2 and L5.  IMPRESSION: Mild small bowel wall thickening with mesenteric stranding in the left mid abdomen.  In the setting of normal lactic acid, this is most suspicious for infectious/inflammatory enteritis.  No evidence of small bowel obstruction.   Original Report Authenticated By: Julian Hy, M.D.        [facility] 03/07/13 0520 03/08/13 0045  WBC 5.7 6.9  HGB 12.8 12.1  HCT 37.9 35.1*  PLT 249 295  MCV 92.9 92.4  MCH 31.4 31.8  MCHC 33.8 34.5  RDW 12.9 12.8  LYMPHSABS 1.7  --   MONOABS 0.5  --   EOSABS 0.2  --   BASOSABS 0.0  --     Chemistries   Recent Labs Lab 03/07/13 0520 03/08/13 0045  NA 131* 131*  K 6.0* 5.3*  CL 91* 90*  CO2 24 22  GLUCOSE 133* 110*  BUN 66* 70*  CREATININE 11.47* 12.02*  CALCIUM 10.1 9.5  AST 10  --   ALT 9  --   ALKPHOS 112  --    BILITOT 0.3  --    ------------------------------------------------------------------------------------------------------------------ estimated creatinine clearance is 3 ml/min (by C-G formula based on Cr of 12.02). ------------------------------------------------------------------------------------------------------------------ No results found for this basename: HGBA1C,  in the last 72 hours ------------------------------------------------------------------------------------------------------------------ No results found for this basename: CHOL, HDL, LDLCALC, TRIG, CHOLHDL, LDLDIRECT,  in the last 72 hours ------------------------------------------------------------------------------------------------------------------ No results found for this basename: TSH, T4TOTAL, FREET3, T3FREE, THYROIDAB,  in the last 72 hours ------------------------------------------------------------------------------------------------------------------ No results found for this basename: VITAMINB12, FOLATE, FERRITIN, TIBC, IRON, RETICCTPCT,  in the last 72 hours  Coagulation profile No results found for this basename: INR, PROTIME,  in the last 168 hours  No results found for this basename: DDIMER,  in the last 72 hours  Cardiac Enzymes  Recent Labs Lab 03/07/13 0744  TROPONINI <0.30   ------------------------------------------------------------------------------------------------------------------ No components found with this basename: POCBNP,

## 2013-03-09 LAB — GLUCOSE, CAPILLARY
Glucose-Capillary: 87 mg/dL (ref 70–99)
Glucose-Capillary: 77 mg/dL (ref 70–99)
Glucose-Capillary: 195 mg/dL — ABNORMAL HIGH (ref 70–99)

## 2013-03-09 LAB — CBC
Hemoglobin: 10.8 g/dL — ABNORMAL LOW (ref 12.0–15.0)
MCH: 31 pg (ref 26.0–34.0)
MCHC: 32.9 g/dL (ref 30.0–36.0)
MCV: 94.3 fL (ref 78.0–100.0)

## 2013-03-09 LAB — RENAL FUNCTION PANEL
Albumin: 3.1 g/dL — ABNORMAL LOW (ref 3.5–5.2)
BUN: 27 mg/dL — ABNORMAL HIGH (ref 6–23)
Chloride: 89 mEq/L — ABNORMAL LOW (ref 96–112)
GFR calc Af Amer: 5 mL/min — ABNORMAL LOW (ref >90)
GFR calc non Af Amer: 4 mL/min — ABNORMAL LOW (ref >90)
Potassium: 4.5 mEq/L (ref 3.5–5.1)
Sodium: 129 mEq/L — ABNORMAL LOW (ref 135–145)

## 2013-03-09 MED ORDER — CIPROFLOXACIN IN D5W 200 MG/100ML IV SOLN
200.0000 mg | Freq: Two times a day (BID) | INTRAVENOUS | Status: DC
  Filled 2013-03-09 (×2): qty 100

## 2013-03-09 MED ORDER — CIPROFLOXACIN IN D5W 400 MG/200ML IV SOLN
400.0000 mg | INTRAVENOUS | Status: DC
  Administered 2013-03-09: 400 mg via INTRAVENOUS
  Filled 2013-03-09 (×3): qty 200

## 2013-03-09 MED ORDER — DOXERCALCIFEROL 4 MCG/2ML IV SOLN
INTRAVENOUS | Status: AC
  Filled 2013-03-09: qty 2

## 2013-03-09 MED ORDER — METRONIDAZOLE IN NACL 5-0.79 MG/ML-% IV SOLN
500.0000 mg | Freq: Three times a day (TID) | INTRAVENOUS | Status: DC
  Administered 2013-03-09 – 2013-03-10 (×3): 500 mg via INTRAVENOUS
  Filled 2013-03-09 (×6): qty 100

## 2013-03-09 NOTE — Progress Notes (Signed)
[facility] PROGRESS NOTE  Assessment/Plan:  Arteriosclerosis, mesenteric artery/ Regional ischemic colitis: - Bowel improved. - Recommend Bowel rest, low residue diet - patient want to eat. - Ct angio 3.9.2014: 50% stenosis of celiac and SMA. - vascular surgery, relates she not a candidate for revascularization. - d/c zosyn, start cipro and flagyl   RENAL FAILURE, END STAGE - HD MWF. - She may have episode of hypotension during HD which can cause ischemia. - Bp is high.    HYPERTENSION - Will allow certain degree of permissive HTN for HD, to avoid ischemia. - cont to monitor.     HYPERKALEMIA: - per HD   COPD: - stable.   Code Status: full code  Family Communication: none at bedside  Disposition Plan: inpatient.   Consultants:  Renal  vascular  Procedures: Ct angio 3.9.2014: Interval resolution of previously questioned bowel wall thickening within several loops of small bowel within the left lower abdominal quadrant. - No definite evidence of mesenteric ischemia. Atherosclerotic plaque involving the SMA and to a lesser extent, the celiac artery, likely approaching only 50% luminal narrowing.   Antibiotics:  Zosyn 3.8.2014- 3.9.2014  HPI/Subjective: Having pain with eating  Objective: Filed Vitals:   03/08/13 1434 03/08/13 1614 03/08/13 2126 03/09/13 0621  BP: 144/62 138/54 166/57 158/69  Pulse: 63 69 63 64  Temp: 98.5 F (36.9 C) 98.7 F (37.1 C) 98.2 F (36.8 C) 98.4 F (36.9 C)  TempSrc: Oral Oral Oral Oral  Resp: 18  17 17   Height:      Weight:   59 kg (130 lb 1.1 oz)   SpO2: 100% 98% 100% 99%    Intake/Output Summary (Last 24 hours) at 03/09/13 0824 Last data filed at 03/08/13 1812  Gross per 24 hour  Intake    840 ml  Output      0 ml  Net    840 ml   Filed Weights   03/07/13 2159 03/08/13 0512 03/08/13 2126  Weight: 61.462 kg (135 lb 8 oz) 56.6 kg (124 lb 12.5 oz) 59 kg (130 lb 1.1 oz)    Exam:  General: Alert, awake,  oriented x3, in no acute distress.  HEENT: No bruits, no goiter.  Heart: Regular rate and rhythm, without murmurs, rubs, gallops.  Lungs: Good air movement, bilateral air movement.  Abdomen: Soft, tender in right upper quadrant. Neuro: Grossly intact, nonfocal.   Data Reviewed: Basic Metabolic Panel:  Recent Labs Lab 03/07/13 0520 03/08/13 0045  NA 131* 131*  K 6.0* 5.3*  CL 91* 90*  CO2 24 22  GLUCOSE 133* 110*  BUN 66* 70*  CREATININE 11.47* 12.02*  CALCIUM 10.1 9.5  PHOS  --  6.6*   Liver Function Tests:  Recent Labs Lab 03/07/13 0520 03/08/13 0045  AST 10  --   ALT 9  --   ALKPHOS 112  --   BILITOT 0.3  --   PROT 8.2  --   ALBUMIN 3.7 3.7    Recent Labs Lab 03/07/13 0520  LIPASE 21   No results found for this basename: AMMONIA,  in the last 168 hours CBC:  Recent Labs Lab 03/07/13 0520 03/08/13 0045  WBC 5.7 6.9  NEUTROABS 3.4  --   HGB 12.8 12.1  HCT 37.9 35.1*  MCV 92.9 92.4  PLT 249 295   Cardiac Enzymes:  Recent Labs Lab 03/07/13 0744  TROPONINI <0.30   BNP (last 3 results) No results found for this basename: PROBNP,  in the last  8760 hours CBG:  Recent Labs Lab 03/07/13 2146 03/08/13 0803 03/08/13 1227 03/08/13 1736 03/08/13 2114  GLUCAP 113* 110* 128* 81 117*    Recent Results (from the past 240 hour(s))  MRSA PCR SCREENING     Status: None   Collection Time    03/07/13  9:00 PM      Result Value Range Status   MRSA by PCR NEGATIVE  NEGATIVE Final   Comment:            The GeneXpert MRSA Assay (FDA     approved for NASAL specimens     only), is one component of a     comprehensive MRSA colonization     surveillance program. It is not     intended to diagnose MRSA     infection nor to guide or     monitor treatment for     MRSA infections.     Studies: Ct Abdomen Pelvis Wo Contrast  03/07/2013  *RADIOLOGY REPORT*  Clinical Data: Renal failure, diffuse abdominal pain, nausea  CT ABDOMEN AND PELVIS WITHOUT  CONTRAST  Technique:  Multidetector CT imaging of the abdomen and pelvis was performed following the standard protocol without intravenous contrast.  Comparison: 09/09/2012  Findings: Mild dependent atelectasis / subpleural reticulation at the lung bases.  Inferior approach dialysis catheter terminates in the right atrium.  Unenhanced liver, spleen, pancreas, and adrenal glands are within normal limits.  Gallbladder is unremarkable.  No intrahepatic or extrahepatic ductal dilatation.  Bilateral renal atrophy.  No hydronephrosis.  Mildly prominent but nondilated loops of proximal small bowel measuring up to 2.3 cm.  A single loop of unopacified small bowel in the left mid abdomen is mildly thick-walled (series 2/image 43) and is notable for associated mesenteric stranding.  No colonic wall thickening or inflammatory changes.  Atherosclerotic calcifications of the abdominal aorta and branch vessels.  No abdominopelvic ascites.  No suspicious abdominopelvic lymphadenopathy.  Status post hysterectomy.  No adnexal masses.  Bladder is within normal limits.  Small fat-containing right inguinal hernia.  Degenerative changes of the visualized thoracolumbar spine.  Prior vertebral augmentation at L2 and L5.  IMPRESSION: Mild small bowel wall thickening with mesenteric stranding in the left mid abdomen.  In the setting of normal lactic acid, this is most suspicious for infectious/inflammatory enteritis.  No evidence of small bowel obstruction.   Original Report Authenticated By: Julian Hy, M.D.    Dg Chest 2 View  03/08/2013  *RADIOLOGY REPORT*  Clinical Data: Hypoxia.  CHEST - 2 VIEW  Comparison: CT chest 02/24/2013. Plain film of the chest 11/25/2012.  Findings: Double-lumen central venous catheter is unchanged in position with its tips in the right atrium.  Small focus of peripheral airspace opacity is identified in the right lower lobe and appears unchanged compared to prior CT.  Left lung is clear. Heart size is  normal.  No pneumothorax or pleural effusion.  IMPRESSION:  Peripheral right lower lobe airspace opacity is seen as on prior CT.  The finding cannot be definitively characterized and could be due to focal inflammatory process but repeat PA and lateral films in two-to-three weeks are recommended to ensure clearing.   Original Report Authenticated By: Orlean Patten, M.D.    Ct Angio Abdomen W/cm &/or Wo Contrast  03/08/2013  *RADIOLOGY REPORT*  Clinical Data:  NONSPECIFIC SMALL BOWEL WALL THICKENING IN PATIENT WITH KNOWN SIGNIFICANT ATHEROSCLEROTIC DISEASE.  EVALUATE FOR MESENTERIC ISCHEMIA  CT ANGIOGRAPHY ABDOMEN AND PELVIS  Technique:  Multidetector CT imaging  of the abdomen and pelvis was performed using the standard protocol during bolus administration of intravenous contrast.  Multiplanar reconstructed images including MIPs were obtained and reviewed to evaluate the vascular anatomy.  Contrast: 141mL OMNIPAQUE IOHEXOL 350 MG/ML SOLN  Comparison:  CT abdomen pelvis - 03/07/2013; 09/09/2012; chest CT - 02/24/2013  Vascular Findings:  Abdominal aorta:  There is rather extensive primarily calcified atherosclerotic plaque within a normal caliber abdominal aorta measuring approximately 1.4 x 1.3 cm in greatest transverse axial dimension.  No abdominal aortic dissection or periaortic stranding.  Celiac artery:  There is a primarily calcified atherosclerotic plaque involving the origin of the celiac artery which likely approaches 50% luminal narrowing.  Conventional branching pattern.  SMA:  There are tandem eccentric foci of calcified and noncalcified plaque involving the origin and proximal aspect of the SMA none of which individually result in greater than 50% luminal narrowing. The distal tributaries of the SMA appear patent.  No definite evidence of distal embolic phenomenon.  Right renal artery:  Solitary; expectedly atrophic given history of end-stage renal disease.  Left renal artery:  Solitary; expectedly  atrophic given history of end-stage renal disease.  IMA:  Patent.  Left-sided pelvic vasculature:  The left common/external iliac artery stent is widely patent.  The left external iliac artery is widely patent.  The left internal iliac artery is heavily diseased all patent and of normal caliber.  There is an end tendon and left dialysis graft within the left thigh.  Right-sided pelvic vasculature:  There is near circumferential calcified atherosclerotic plaque within the right common iliac artery, not resulting in hemodynamically significant stenosis.  The right external iliac artery is diseased though patent and free of hemodynamically significant narrowing.  The right internal iliac artery is heavily disease are patent and of normal caliber.  Note is again made of a right common femoral vein approach dialysis catheter.  Review of the MIP images confirms the above findings.  ------------------------------------------------------------------- --  Nonvascular findings:  Normal hepatic contour.  No discrete hepatic lesions.  Normal gallbladder.  No intra or extrahepatic biliary duct dilatation.  No ascites.  The bilateral kidneys are expected atrophic compatible with provided history of end stage renal disease.  No discrete renal lesions.  No renal stones evidence of urinary obstruction.  There is unchanged mild thickening of the lateral limb of the left adrenal gland without discrete nodule.  Normal appearance of the right adrenal gland and spleen.  The pancreas is atrophic.  No peripancreatic stranding.  The previously questioned short segment of bowel wall thickening within a loop of ileum is less conspicuous on the present examination. No discrete areas of bowel wall thickening or mesenteric stranding.  No pneumoperitoneum, pneumatosis or portal venous gas.  Previously administered enteric contrast now extends to the level of the rectum.  No evidence of enteric obstruction.  Shoddy retroperitoneal lymph nodes are  numerous though individually are not enlarged by CT criteria.  No pathologically enlarged retroperitoneal, mesenteric, pelvic or inguinal lymph nodes.  Post hysterectomy.  No discrete adnexal lesion.  No free fluid in the pelvis.  Limited visualization of the lower thorax demonstrates minimal increase and peripherally located mass-like consolidation within the subpleural aspect of the right lower lobe, measuring approximately 1.7 x 1.1 cm (image 1, series 7), previously, 1.6 x 0.8 cm.  The structures again associated with a minimal amount of surrounding ground glass.  Unchanged approximately 1.2 x 0.9 cm pneumatocele within the right lower lobe (image 4, series 7). Centrilobular  emphysematous change.  Minimal ground-glass atelectasis.  No pleural effusion.  Normal heart size.  The tips of a right femoral approach dialysis catheter terminate within the right atrium.  No pericardial effusion.  No acute or aggressive osseous abnormalities.  Grossly unchanged vertebroplasty/kyphoplasty of L2 and L5 with associated moderate compression deformity of L2 and severe compression deformity of L5. There is unchanged mild (<25%) compression deformity of the L4 vertebral body.  IMPRESSION:  1.  Interval resolution of previously questioned bowel wall thickening within several loops of small bowel within the left lower abdominal quadrant.  No definite evidence of bowel wall thickening or enteric obstruction.  2.  Extensive atherosclerotic plaque within a normal caliber abdominal aorta.  3.  No definite evidence of mesenteric ischemia.  Atherosclerotic plaque involving the SMA and to a lesser extent, the celiac artery, likely approaching only 50% luminal narrowing.  The IMA remains patent.  No definite evidence of distal embolic phenomenon within the distal opacified tributaries of the SMA.  4. Left common/external iliac arterial stent is widely patent.  5. The bilateral kidneys and renal arteries are extensively atrophic given  history of end-stage renal disease.  Right femoral approach dialysis catheter tips terminating within the right atrium.  Abandoned left thigh dialysis graft, incompletely imaged.  6.  Minimal increase in peripherally located mass-like consolidation within the right lower lobe, now measuring 1.7 x 1.1 cm, previously 1.6 x 0.8 cm.  While this structure is remains indeterminate and possibly of infectious in etiology, given its interval increase, a follow-up chest CT in 4 to 6 weeks after resolution of symptoms is recommended to ensure resolution and exclude the presence of an underlying nodule/mass.  7.  Grossly unchanged sequela of vertebroplasty/kyphoplasty of L2 and L5.  Above findings discussed with Dr. Oneida Alar at [phone].   Original Report Authenticated By: Jake Seats, MD     Scheduled Meds: . aspirin  81 mg Oral Daily  . calcium acetate  2,001 mg Oral TID WC  . cloNIDine  0.2 mg Oral BID  . doxercalciferol  1 mcg Intravenous Q M,W,F-HD  . enoxaparin (LOVENOX) injection  30 mg Subcutaneous Q24H  . feeding supplement  1 Container Oral BID BM  . insulin aspart  0-9 Units Subcutaneous TID WC  . losartan  100 mg Oral QHS  . multivitamin  1 tablet Oral QHS  . pantoprazole  40 mg Oral BID  . piperacillin-tazobactam (ZOSYN)  IV  2.25 g Intravenous Q8H   Continuous Infusions:    Charlynne Cousins  [facility] Pager [phone]. If 8PM-8AM, please contact night-coverage at [email or web address], password [facility] 03/09/2013, 8:24 AM  LOS: 2 days

## 2013-03-09 NOTE — Progress Notes (Signed)
Utilization review completed.  

## 2013-03-09 NOTE — Progress Notes (Addendum)
Pt refusing to put on hospital socks. Explained about the importance of wearing hospital socks. Pt still refused. Bed alarm set. Pt reminded to call for assistance and not to get up by herself. Pt verbalized understanding. Will continue to monitor. [place], [name]

## 2013-03-09 NOTE — Progress Notes (Signed)
[facility] Progress Note  Subjective:   Wants solid foods  Objective Filed Vitals:   03/08/13 1614 03/08/13 2126 03/09/13 0621 03/09/13 0900  BP: 138/54 166/57 158/69 164/60  Pulse: 69 63 64 58  Temp: 98.7 F (37.1 C) 98.2 F (36.8 C) 98.4 F (36.9 C) 98.1 F (36.7 C)  TempSrc: Oral Oral Oral   Resp:  17 17 17   Height:      Weight:  59 kg (130 lb 1.1 oz)    SpO2: 98% 100% 99% 100%   Physical Exam General: talkative focused on solid food Heart: RRR Lungs: no wheezes or rales Abdomen: mid abdominal tenderness Extremities: no LE edema Dialysis Access: Rt fem cath   Dialysis Orders: Center: [facility] MWF - 3 hr 45 min Optiflux 160 400/A 1.5 2K 2.25 Ca profile 4 right fem cath EDW 55.5 heparin 10K hectorol 1 Epo 2400 TIW; Recent labs: Hgb mid 11s stable iPTH 357 AIC 5.9 in January   Assessment/Plan:  1. Abdominal pain/Enteritis - neg lipase or leukocytosis; lactic acid nl,  Bowel rest, clears, CT angio shows 50% SMA and celiac stenosis - some degree of mesenteric ischemia - antibiotics changed to flagyl and cipro; bowel thickening resolved.per CT angio 2. ESRD - MWF - HD today 3. Hypertension/volume - SBPs 120s - 150s. Continue current meds; - avoid BP drops < 120 4. Anemia - Hgb 12.1 - baseline mid 11s; hold ESA for now 5. Metabolic bone disease - Corrected Ca 9.4. P 6.6; Takes 3 phoslo with meals/ 2 with snacks when she remembers. Continue Hectorol 1 and encourage binders 6. Nutrition - albumin 3.7 - On CL's and Breeze supplement - primary to advance diet as tolerated 7. DM - BG's improving.  8. RLL mass/consolidation - ? Significance - f/u CT recommended for resolution  Myriam Jacobson, PA-C [place] [phone] 03/09/2013,11:26 AM  LOS: 2 days   Patient seen and examined.  I agree with plan as above with additions as indicated. GI symptoms improving, wants to eat solid food. Rec's as above. Kelly Splinter  MD [phone] pgr    [phone]  cell 03/09/2013, 1:05 PM   Additional Objective Labs: Basic Metabolic Panel:  Recent Labs Lab 03/07/13 0520 03/08/13 0045  NA 131* 131*  K 6.0* 5.3*  CL 91* 90*  CO2 24 22  GLUCOSE 133* 110*  BUN 66* 70*  CREATININE 11.47* 12.02*  CALCIUM 10.1 9.5  PHOS  --  6.6*   Liver Function Tests:  Recent Labs Lab 03/07/13 0520 03/08/13 0045  AST 10  --   ALT 9  --   ALKPHOS 112  --   BILITOT 0.3  --   PROT 8.2  --   ALBUMIN 3.7 3.7    Recent Labs Lab 03/07/13 0520  LIPASE 21   CBC:  Recent Labs Lab 03/07/13 0520 03/08/13 0045  WBC 5.7 6.9  NEUTROABS 3.4  --   HGB 12.8 12.1  HCT 37.9 35.1*  MCV 92.9 92.4  PLT 249 295  Cardiac Enzymes:  Recent Labs Lab 03/07/13 0744  TROPONINI <0.30   CBG:  Recent Labs Lab 03/08/13 0803 03/08/13 1227 03/08/13 1736 03/08/13 2114 03/09/13 0741  GLUCAP 110* 128* 81 117* 87  Studies/Results: Dg Chest 2 View  03/08/2013  *RADIOLOGY REPORT*  Clinical Data: Hypoxia.  CHEST - 2 VIEW  Comparison: CT chest 02/24/2013. Plain film of the chest 11/25/2012.  Findings: Double-lumen central venous catheter is unchanged in position with its tips in  the right atrium.  Small focus of peripheral airspace opacity is identified in the right lower lobe and appears unchanged compared to prior CT.  Left lung is clear. Heart size is normal.  No pneumothorax or pleural effusion.  IMPRESSION:  Peripheral right lower lobe airspace opacity is seen as on prior CT.  The finding cannot be definitively characterized and could be due to focal inflammatory process but repeat PA and lateral films in two-to-three weeks are recommended to ensure clearing.   Original Report Authenticated By: Orlean Patten, M.D.    Ct Angio Abdomen W/cm &/or Wo Contrast  03/08/2013  *RADIOLOGY REPORT*  Clinical Data:  NONSPECIFIC SMALL BOWEL WALL THICKENING IN PATIENT WITH KNOWN SIGNIFICANT ATHEROSCLEROTIC DISEASE.  EVALUATE FOR MESENTERIC ISCHEMIA  CT ANGIOGRAPHY ABDOMEN AND  PELVIS  Technique:  Multidetector CT imaging of the abdomen and pelvis was performed using the standard protocol during bolus administration of intravenous contrast.  Multiplanar reconstructed images including MIPs were obtained and reviewed to evaluate the vascular anatomy.  Contrast: 176mL OMNIPAQUE IOHEXOL 350 MG/ML SOLN  Comparison:  CT abdomen pelvis - 03/07/2013; 09/09/2012; chest CT - 02/24/2013  Vascular Findings:  Abdominal aorta:  There is rather extensive primarily calcified atherosclerotic plaque within a normal caliber abdominal aorta measuring approximately 1.4 x 1.3 cm in greatest transverse axial dimension.  No abdominal aortic dissection or periaortic stranding.  Celiac artery:  There is a primarily calcified atherosclerotic plaque involving the origin of the celiac artery which likely approaches 50% luminal narrowing.  Conventional branching pattern.  SMA:  There are tandem eccentric foci of calcified and noncalcified plaque involving the origin and proximal aspect of the SMA none of which individually result in greater than 50% luminal narrowing. The distal tributaries of the SMA appear patent.  No definite evidence of distal embolic phenomenon.  Right renal artery:  Solitary; expectedly atrophic given history of end-stage renal disease.  Left renal artery:  Solitary; expectedly atrophic given history of end-stage renal disease.  IMA:  Patent.  Left-sided pelvic vasculature:  The left common/external iliac artery stent is widely patent.  The left external iliac artery is widely patent.  The left internal iliac artery is heavily diseased all patent and of normal caliber.  There is an end tendon and left dialysis graft within the left thigh.  Right-sided pelvic vasculature:  There is near circumferential calcified atherosclerotic plaque within the right common iliac artery, not resulting in hemodynamically significant stenosis.  The right external iliac artery is diseased though patent and free of  hemodynamically significant narrowing.  The right internal iliac artery is heavily disease are patent and of normal caliber.  Note is again made of a right common femoral vein approach dialysis catheter.  Review of the MIP images confirms the above findings.  ------------------------------------------------------------------- --  Nonvascular findings:  Normal hepatic contour.  No discrete hepatic lesions.  Normal gallbladder.  No intra or extrahepatic biliary duct dilatation.  No ascites.  The bilateral kidneys are expected atrophic compatible with provided history of end stage renal disease.  No discrete renal lesions.  No renal stones evidence of urinary obstruction.  There is unchanged mild thickening of the lateral limb of the left adrenal gland without discrete nodule.  Normal appearance of the right adrenal gland and spleen.  The pancreas is atrophic.  No peripancreatic stranding.  The previously questioned short segment of bowel wall thickening within a loop of ileum is less conspicuous on the present examination. No discrete areas of bowel wall thickening or  mesenteric stranding.  No pneumoperitoneum, pneumatosis or portal venous gas.  Previously administered enteric contrast now extends to the level of the rectum.  No evidence of enteric obstruction.  Shoddy retroperitoneal lymph nodes are numerous though individually are not enlarged by CT criteria.  No pathologically enlarged retroperitoneal, mesenteric, pelvic or inguinal lymph nodes.  Post hysterectomy.  No discrete adnexal lesion.  No free fluid in the pelvis.  Limited visualization of the lower thorax demonstrates minimal increase and peripherally located mass-like consolidation within the subpleural aspect of the right lower lobe, measuring approximately 1.7 x 1.1 cm (image 1, series 7), previously, 1.6 x 0.8 cm.  The structures again associated with a minimal amount of surrounding ground glass.  Unchanged approximately 1.2 x 0.9 cm pneumatocele  within the right lower lobe (image 4, series 7). Centrilobular emphysematous change.  Minimal ground-glass atelectasis.  No pleural effusion.  Normal heart size.  The tips of a right femoral approach dialysis catheter terminate within the right atrium.  No pericardial effusion.  No acute or aggressive osseous abnormalities.  Grossly unchanged vertebroplasty/kyphoplasty of L2 and L5 with associated moderate compression deformity of L2 and severe compression deformity of L5. There is unchanged mild (<25%) compression deformity of the L4 vertebral body.  IMPRESSION:  1.  Interval resolution of previously questioned bowel wall thickening within several loops of small bowel within the left lower abdominal quadrant.  No definite evidence of bowel wall thickening or enteric obstruction.  2.  Extensive atherosclerotic plaque within a normal caliber abdominal aorta.  3.  No definite evidence of mesenteric ischemia.  Atherosclerotic plaque involving the SMA and to a lesser extent, the celiac artery, likely approaching only 50% luminal narrowing.  The IMA remains patent.  No definite evidence of distal embolic phenomenon within the distal opacified tributaries of the SMA.  4. Left common/external iliac arterial stent is widely patent.  5. The bilateral kidneys and renal arteries are extensively atrophic given history of end-stage renal disease.  Right femoral approach dialysis catheter tips terminating within the right atrium.  Abandoned left thigh dialysis graft, incompletely imaged.  6.  Minimal increase in peripherally located mass-like consolidation within the right lower lobe, now measuring 1.7 x 1.1 cm, previously 1.6 x 0.8 cm.  While this structure is remains indeterminate and possibly of infectious in etiology, given its interval increase, a follow-up chest CT in 4 to 6 weeks after resolution of symptoms is recommended to ensure resolution and exclude the presence of an underlying nodule/mass.  7.  Grossly unchanged  sequela of vertebroplasty/kyphoplasty of L2 and L5.  Above findings discussed with Dr. Oneida Alar at [phone].   Original Report Authenticated By: Jake Seats, MD    Medications:   . aspirin  81 mg Oral Daily  . calcium acetate  2,001 mg Oral TID WC  . ciprofloxacin  200 mg Intravenous Q12H  . cloNIDine  0.2 mg Oral BID  . doxercalciferol  1 mcg Intravenous Q M,W,F-HD  . enoxaparin (LOVENOX) injection  30 mg Subcutaneous Q24H  . feeding supplement  1 Container Oral BID BM  . insulin aspart  0-9 Units Subcutaneous TID WC  . losartan  100 mg Oral QHS  . metronidazole  500 mg Intravenous Q8H  . multivitamin  1 tablet Oral QHS  . pantoprazole  40 mg Oral BID

## 2013-03-09 NOTE — Progress Notes (Signed)
Patient has taken tele leads off and refused to have them reapplied. Patient stated "this is my body and I don't want it on." Notified MD on call and central tele.

## 2013-03-09 NOTE — Progress Notes (Signed)
Nutrition Brief Note  Patient identified on the Malnutrition Screening Tool (MST) Report. Pt reports that her appetite was great PTA. Asking this RD multiple times to bring her some food, she is hungry and ready to eat. Currently has appropriate oral nutrition supplements ordered.  Wt Readings from Last 10 Encounters:  03/08/13 130 lb 1.1 oz (59 kg)  02/20/13 124 lb (56.246 kg)  02/03/13 125 lb 12.8 oz (57.063 kg)  11/26/12 130 lb 1.1 oz (59 kg)  11/26/12 130 lb 1.1 oz (59 kg)  11/04/12 135 lb (61.236 kg)  10/13/12 134 lb 11.2 oz (61.1 kg)  10/13/12 134 lb 11.2 oz (61.1 kg)  10/07/12 139 lb 8 oz (63.277 kg)  10/07/12 139 lb 14.4 oz (63.458 kg)    Body mass index is 27.19 kg/(m^2). Patient meets criteria for Overweight based on current BMI.   Current diet order is Clear Liquids, patient is consuming approximately 100% of meals at this time. Labs and medications reviewed.   No nutrition interventions warranted at this time. If nutrition issues arise, please consult RD.   Inda Coke MS, RD, LDN Pager: [phone] After-hours pager: [phone]

## 2013-03-10 ENCOUNTER — Encounter (HOSPITAL_COMMUNITY): Payer: Self-pay | Admitting: Nephrology

## 2013-03-10 DIAGNOSIS — E119 Type 2 diabetes mellitus without complications: Secondary | ICD-10-CM

## 2013-03-10 DIAGNOSIS — I70219 Atherosclerosis of native arteries of extremities with intermittent claudication, unspecified extremity: Secondary | ICD-10-CM

## 2013-03-10 DIAGNOSIS — K55059 Acute (reversible) ischemia of intestine, part and extent unspecified: Secondary | ICD-10-CM

## 2013-03-10 LAB — GLUCOSE, CAPILLARY

## 2013-03-10 MED ORDER — CIPROFLOXACIN HCL 500 MG PO TABS: 500.0000 mg | ORAL_TABLET | Freq: Two times a day (BID) | ORAL | Status: DC

## 2013-03-10 MED ORDER — METRONIDAZOLE 500 MG PO TABS: 500.0000 mg | ORAL_TABLET | Freq: Three times a day (TID) | ORAL | Status: DC

## 2013-03-10 MED ORDER — AMLODIPINE BESYLATE 10 MG PO TABS: 5.0000 mg | ORAL_TABLET | Freq: Every day | ORAL | Status: DC

## 2013-03-10 NOTE — Progress Notes (Signed)
Patient discharge home, discharge instructions given and explained to patient. Copies of all forms given.  Voiced understanding.

## 2013-03-10 NOTE — Progress Notes (Signed)
Subjective: Abdominal Pain improved, tolerated AM liquids asking for Solids, no diarrhea this am, singed off early  Yesterday HD "liquid stool and I wanted Off" Objective Vital signs in last 24 hours: Filed Vitals:   03/09/13 1706 03/09/13 1730 03/09/13 2022 03/10/13 0446  BP: 183/93 189/65 166/65 140/55  Pulse: 76 83 84 67  Temp: 98.6 F (37 C) 98.3 F (36.8 C) 98.9 F (37.2 C) 98.2 F (36.8 C)  TempSrc: Oral  Oral Oral  Resp: 17 18 18 18   Height:   4\' 10"  (1.473 m)   Weight: 53.6 kg (118 lb 2.7 oz)  53.601 kg (118 lb 2.7 oz)   SpO2: 99% 100% 100% 100%   Weight change: -1.6 kg (-3 lb 8.4 oz)  Intake/Output Summary (Last 24 hours) at 03/10/13 0857 Last data filed at 03/10/13 0617  Gross per 24 hour  Intake   1240 ml  Output   1410 ml  Net   -170 ml   Labs: Basic Metabolic Panel:  Recent Labs Lab 03/07/13 0520 03/08/13 0045 03/09/13 1400  NA 131* 131* 129*  K 6.0* 5.3* 4.5  CL 91* 90* 89*  CO2 24 22 23   GLUCOSE 133* 110* 242*  BUN 66* 70* 27*  CREATININE 11.47* 12.02* 8.33*  CALCIUM 10.1 9.5 9.5  PHOS  --  6.6* 5.2*   Liver Function Tests:  Recent Labs Lab 03/07/13 0520 03/08/13 0045 03/09/13 1400  AST 10  --   --   ALT 9  --   --   ALKPHOS 112  --   --   BILITOT 0.3  --   --   PROT 8.2  --   --   ALBUMIN 3.7 3.7 3.1*    Recent Labs Lab 03/07/13 0520  LIPASE 21   No results found for this basename: AMMONIA,  in the last 168 hours CBC:  Recent Labs Lab 03/07/13 0520 03/08/13 0045 03/09/13 1400  WBC 5.7 6.9 4.0  NEUTROABS 3.4  --   --   HGB 12.8 12.1 10.8*  HCT 37.9 35.1* 32.8*  MCV 92.9 92.4 94.3  PLT 249 295 186   Cardiac Enzymes:  Recent Labs Lab 03/07/13 0744  TROPONINI <0.30   CBG:  Recent Labs Lab 03/09/13 0741 03/09/13 1156 03/09/13 1804 03/09/13 2027 03/10/13 0803  GLUCAP 87 95 77 195* 105*    Iron Studies: No results found for this basename: IRON, TIBC, TRANSFERRIN, FERRITIN,  in the last 72  hours Studies/Results: Dg Chest 2 View  03/08/2013  *RADIOLOGY REPORT*  Clinical Data: Hypoxia.  CHEST - 2 VIEW  Comparison: CT chest 02/24/2013. Plain film of the chest 11/25/2012.  Findings: Double-lumen central venous catheter is unchanged in position with its tips in the right atrium.  Small focus of peripheral airspace opacity is identified in the right lower lobe and appears unchanged compared to prior CT.  Left lung is clear. Heart size is normal.  No pneumothorax or pleural effusion.  IMPRESSION:  Peripheral right lower lobe airspace opacity is seen as on prior CT.  The finding cannot be definitively characterized and could be due to focal inflammatory process but repeat PA and lateral films in two-to-three weeks are recommended to ensure clearing.   Original Report Authenticated By: Orlean Patten, M.D.    Ct Angio Abdomen W/cm &/or Wo Contrast  03/08/2013  *RADIOLOGY REPORT*  Clinical Data:  NONSPECIFIC SMALL BOWEL WALL THICKENING IN PATIENT WITH KNOWN SIGNIFICANT ATHEROSCLEROTIC DISEASE.  EVALUATE FOR MESENTERIC ISCHEMIA  CT ANGIOGRAPHY ABDOMEN  AND PELVIS  Technique:  Multidetector CT imaging of the abdomen and pelvis was performed using the standard protocol during bolus administration of intravenous contrast.  Multiplanar reconstructed images including MIPs were obtained and reviewed to evaluate the vascular anatomy.  Contrast: 139mL OMNIPAQUE IOHEXOL 350 MG/ML SOLN  Comparison:  CT abdomen pelvis - 03/07/2013; 09/09/2012; chest CT - 02/24/2013  Vascular Findings:  Abdominal aorta:  There is rather extensive primarily calcified atherosclerotic plaque within a normal caliber abdominal aorta measuring approximately 1.4 x 1.3 cm in greatest transverse axial dimension.  No abdominal aortic dissection or periaortic stranding.  Celiac artery:  There is a primarily calcified atherosclerotic plaque involving the origin of the celiac artery which likely approaches 50% luminal narrowing.  Conventional  branching pattern.  SMA:  There are tandem eccentric foci of calcified and noncalcified plaque involving the origin and proximal aspect of the SMA none of which individually result in greater than 50% luminal narrowing. The distal tributaries of the SMA appear patent.  No definite evidence of distal embolic phenomenon.  Right renal artery:  Solitary; expectedly atrophic given history of end-stage renal disease.  Left renal artery:  Solitary; expectedly atrophic given history of end-stage renal disease.  IMA:  Patent.  Left-sided pelvic vasculature:  The left common/external iliac artery stent is widely patent.  The left external iliac artery is widely patent.  The left internal iliac artery is heavily diseased all patent and of normal caliber.  There is an end tendon and left dialysis graft within the left thigh.  Right-sided pelvic vasculature:  There is near circumferential calcified atherosclerotic plaque within the right common iliac artery, not resulting in hemodynamically significant stenosis.  The right external iliac artery is diseased though patent and free of hemodynamically significant narrowing.  The right internal iliac artery is heavily disease are patent and of normal caliber.  Note is again made of a right common femoral vein approach dialysis catheter.  Review of the MIP images confirms the above findings.  ------------------------------------------------------------------- --  Nonvascular findings:  Normal hepatic contour.  No discrete hepatic lesions.  Normal gallbladder.  No intra or extrahepatic biliary duct dilatation.  No ascites.  The bilateral kidneys are expected atrophic compatible with provided history of end stage renal disease.  No discrete renal lesions.  No renal stones evidence of urinary obstruction.  There is unchanged mild thickening of the lateral limb of the left adrenal gland without discrete nodule.  Normal appearance of the right adrenal gland and spleen.  The pancreas is  atrophic.  No peripancreatic stranding.  The previously questioned short segment of bowel wall thickening within a loop of ileum is less conspicuous on the present examination. No discrete areas of bowel wall thickening or mesenteric stranding.  No pneumoperitoneum, pneumatosis or portal venous gas.  Previously administered enteric contrast now extends to the level of the rectum.  No evidence of enteric obstruction.  Shoddy retroperitoneal lymph nodes are numerous though individually are not enlarged by CT criteria.  No pathologically enlarged retroperitoneal, mesenteric, pelvic or inguinal lymph nodes.  Post hysterectomy.  No discrete adnexal lesion.  No free fluid in the pelvis.  Limited visualization of the lower thorax demonstrates minimal increase and peripherally located mass-like consolidation within the subpleural aspect of the right lower lobe, measuring approximately 1.7 x 1.1 cm (image 1, series 7), previously, 1.6 x 0.8 cm.  The structures again associated with a minimal amount of surrounding ground glass.  Unchanged approximately 1.2 x 0.9 cm pneumatocele within the  right lower lobe (image 4, series 7). Centrilobular emphysematous change.  Minimal ground-glass atelectasis.  No pleural effusion.  Normal heart size.  The tips of a right femoral approach dialysis catheter terminate within the right atrium.  No pericardial effusion.  No acute or aggressive osseous abnormalities.  Grossly unchanged vertebroplasty/kyphoplasty of L2 and L5 with associated moderate compression deformity of L2 and severe compression deformity of L5. There is unchanged mild (<25%) compression deformity of the L4 vertebral body.  IMPRESSION:  1.  Interval resolution of previously questioned bowel wall thickening within several loops of small bowel within the left lower abdominal quadrant.  No definite evidence of bowel wall thickening or enteric obstruction.  2.  Extensive atherosclerotic plaque within a normal caliber abdominal  aorta.  3.  No definite evidence of mesenteric ischemia.  Atherosclerotic plaque involving the SMA and to a lesser extent, the celiac artery, likely approaching only 50% luminal narrowing.  The IMA remains patent.  No definite evidence of distal embolic phenomenon within the distal opacified tributaries of the SMA.  4. Left common/external iliac arterial stent is widely patent.  5. The bilateral kidneys and renal arteries are extensively atrophic given history of end-stage renal disease.  Right femoral approach dialysis catheter tips terminating within the right atrium.  Abandoned left thigh dialysis graft, incompletely imaged.  6.  Minimal increase in peripherally located mass-like consolidation within the right lower lobe, now measuring 1.7 x 1.1 cm, previously 1.6 x 0.8 cm.  While this structure is remains indeterminate and possibly of infectious in etiology, given its interval increase, a follow-up chest CT in 4 to 6 weeks after resolution of symptoms is recommended to ensure resolution and exclude the presence of an underlying nodule/mass.  7.  Grossly unchanged sequela of vertebroplasty/kyphoplasty of L2 and L5.  Above findings discussed with Dr. Oneida Alar at [phone].   Original Report Authenticated By: Jake Seats, MD    Medications:   . aspirin  81 mg Oral Daily  . calcium acetate  2,001 mg Oral TID WC  . ciprofloxacin  400 mg Intravenous Q24H  . cloNIDine  0.2 mg Oral BID  . doxercalciferol  1 mcg Intravenous Q M,W,F-HD  . enoxaparin (LOVENOX) injection  30 mg Subcutaneous Q24H  . feeding supplement  1 Container Oral BID BM  . insulin aspart  0-9 Units Subcutaneous TID WC  . losartan  100 mg Oral QHS  . metronidazole  500 mg Intravenous Q8H  . multivitamin  1 tablet Oral QHS  . pantoprazole  40 mg Oral BID   I  have reviewed scheduled and prn medications.  Physical Exam: General: Alert, NAD. Appropriate Heart: RRR. 1/6 sem lsb  Lungs: CTA Abdomen:  bs pos. And somewhat hyperactive soft,  min. Epigastric tenderness minimal Extremities: Dialysis Access: no  Pedal edema/  Right femoral  Perm cath   Dialysis Orders: Center: [facility] MWF - 3 hr 45 min Optiflux 160 400/A 1.5 2K 2.25 Ca profile 4 right fem cath EDW 55.5 heparin 10K hectorol 1 Epo 2400 TIW; Recent labs: Hgb mid 11s stable iPTH 357 AIC 5.9 in January   Assessment/Plan:  1. 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 On IV Flagyl and IV Cipro. VVS noted not a  Surgical candidate. GI signed off/ I discussed with her to change from 2 large meals at hone  To  4 small meals at home when eating solids/ She reports eating 2 large meals at home before admit and missing her HD TX. 2. ESRD - MWF - HD  Yesterday as noted signed off early sec. To liquid stool. 3. Hypertension/volume-   uf yesterday 1910. Post wt  53.6. With bp stable 2 kgs below edw and bp this am 140/55. Continue current meds; - avoid BP drops < 120 4. Anemia - Hgb 10.8 - Restart  ESA  60 mcg  On wed hd 5. Metabolic bone disease - Corrected Ca 10.4 . P 5.2; Takes 3 phoslo with meals/ 2 with snacks . she admits to noncompliance.Hectoral 35mcg q hd/ 2.25 CA bath 6. Nutrition - albumin 3.1 - On CL's and Breeze supplement - primary to advance diet as tolerated 7. DM - BG's stable.  8. RLL mass/consolidation - ? Significance - f/u CT recommended for resolution   Ernest Haber, PA-C [place] [phone] 03/10/2013,8:57 AM  LOS: 3 days   Patient seen and examined.  Agree with assessment and plan as above. Kelly Splinter  MD [phone] pgr    [phone] cell 03/10/2013, 11:26 AM

## 2013-03-10 NOTE — Discharge Summary (Signed)
Physician Discharge Summary  [name] [id number] DOB: [birth year] DOA: 03/07/2013  PCP: Vena Austria, MD  Admit date: 03/07/2013 Discharge date: 03/10/2013  Time spent: 30 minutes  Recommendations for Outpatient Follow-up:  1. PCP in  2 vision into the pharynx-4 weeks  Discharge Diagnoses:  Active Problems:   HYPERKALEMIA   HYPERTENSION   COPD   RENAL FAILURE, END STAGE   Nonocclusive mesenteric ischemia   Arteriosclerosis, mesenteric artery   Enteritis   Discharge Condition: stable renal diet  Diet recommendation: she will be on a renal diet with frequent and small meals and frequent throughout the day.  Filed Weights   03/09/13 1350 03/09/13 1706 03/09/13 2022  Weight: 57.4 kg (126 lb 8.7 oz) 53.6 kg (118 lb 2.7 oz) 53.601 kg (118 lb 2.7 oz)    History of present illness:  79 y.o. female with prior h/o ESRD on HD, hypertension, diet controlled DM, came in for persistent abdominal pain started in the epigastric area and more generalized now, associated with nausea, novomiting or diarrhea. It started Thursday after a big meal. She missed dialysis on Friday because of the abd pain. On rrival to ED she underwent a CT abd and pelvis, showing small bowel enteritis. She was started on zosyn and is being admitted to medical service for further Management and nephrology Consulted for HD today   Hospital Course:  Arteriosclerosis, mesenteric artery/ Regional ischemic colitis:  - CT of the abdomen and pelvis on 03/07/2013: Mild small bowel wall thickening with mesenteric stranding in the  left mid abdomen. - Surgery was consulted they recommended bowel rest,abdominal pain improved. She was started empirically on Zosyn on admission this was changed to Cipro and Flagyl. She was changed to a low-residue diet small meals. - pain improved. She will continue at home with frequent small meals. - vascular surgery recommended aCt angio 3.9.2014: 50% stenosis of celiac and SMA.   - vascular surgery, relates she not a candidate for revascularization.  - continue aspirin.  RENAL FAILURE, END STAGE  - HD MWF.  - She may have episode of hypotension during HD which can cause ischemia.  - Bp is high.   HYPERTENSION  - Will allow certain degree of permissive HTN for HD, to avoid ischemia.  - cont to monitor.   HYPERKALEMIA:  - per HD.  COPD:  - stable.   Procedures: CT of the abdomen and pelvis on 03/08/2014Mild small bowel wall thickening with mesenteric stranding in the  left mid abdomen. CT angiogram the abdomen and pelvis that showed a 50% stenosis of the celiac and SMA.  Consultations:  Vascular surgery  Nephrology  Gastroenterology  Discharge Exam: Filed Vitals:   03/09/13 1730 03/09/13 2022 03/10/13 0446 03/10/13 0858  BP: 189/65 166/65 140/55 170/64  Pulse: 83 84 67 88  Temp: 98.3 F (36.8 C) 98.9 F (37.2 C) 98.2 F (36.8 C) 98.7 F (37.1 C)  TempSrc:  Oral Oral Oral  Resp: 18 18 18 18   Height:  4\' 10"  (1.473 m)    Weight:  53.601 kg (118 lb 2.7 oz)    SpO2: 100% 100% 100% 100%    General: awake alert and oriented x3 Cardiovascular: rate and rhythm Respiratory: good air movement and clear to auscultation  Discharge Instructions  Discharge Orders   Future Appointments Provider Department Dept Phone   03/13/2013 8:30 AM [facility] [phone]   03/13/2013 9:00 AM Conrad Vallonia, MD [facility] [phone]  08/04/2013 10:00 AM [facility] 2 [facility] [phone]   08/04/2013 10:30 AM [facility] Lab 2 [facility] [phone]   08/04/2013 11:00 AM Princess Perna, NP [facility] [phone]   Future Orders Complete By Expires     Diet - low sodium heart healthy  As directed     Increase activity slowly  As directed         Medication List    STOP taking these  medications       cloNIDine 0.2 MG tablet  Commonly known as:  CATAPRES     losartan 100 MG tablet  Commonly known as:  COZAAR      TAKE these medications       amLODipine 10 MG tablet  Commonly known as:  NORVASC  Take 0.5 tablets (5 mg total) by mouth daily.     aspirin EC 81 MG tablet  Take 81 mg by mouth daily.     calcium acetate 667 MG capsule  Commonly known as:  PHOSLO  Take 667 mg by mouth 3 (three) times daily. 1 tablet with every meal, 2 tabs with snacks     ciprofloxacin 500 MG tablet  Commonly known as:  CIPRO  Take 1 tablet (500 mg total) by mouth 2 (two) times daily.     metoprolol succinate 50 MG 24 hr tablet  Commonly known as:  TOPROL-XL  Take 50 mg by mouth daily. Take with or immediately following a meal.     metroNIDAZOLE 500 MG tablet  Commonly known as:  FLAGYL  Take 1 tablet (500 mg total) by mouth 3 (three) times daily.     pantoprazole 40 MG tablet  Commonly known as:  PROTONIX  Take 40 mg by mouth daily.     traMADol 50 MG tablet  Commonly known as:  ULTRAM  Take 50-100 mg by mouth every 6 (six) hours as needed. For pain           Follow-up Information   Follow up with Vena Austria, MD In 2 weeks. [facility] followup)    Contact information:   [facility] [place] [address] [phone]        The results of significant diagnostics from this hospitalization (including imaging, microbiology, ancillary and laboratory) are listed below for reference.    Significant Diagnostic Studies: Ct Abdomen Pelvis Wo Contrast  03/07/2013  *RADIOLOGY REPORT*  Clinical Data: Renal failure, diffuse abdominal pain, nausea  CT ABDOMEN AND PELVIS WITHOUT CONTRAST  Technique:  Multidetector CT imaging of the abdomen and pelvis was performed following the standard protocol without intravenous contrast.  Comparison: 09/09/2012  Findings: Mild dependent atelectasis / subpleural reticulation at  the lung bases.  Inferior approach dialysis catheter terminates in the right atrium.  Unenhanced liver, spleen, pancreas, and adrenal glands are within normal limits.  Gallbladder is unremarkable.  No intrahepatic or extrahepatic ductal dilatation.  Bilateral renal atrophy.  No hydronephrosis.  Mildly prominent but nondilated loops of proximal small bowel measuring up to 2.3 cm.  A single loop of unopacified small bowel in the left mid abdomen is mildly thick-walled (series 2/image 43) and is notable for associated mesenteric stranding.  No colonic wall thickening or inflammatory changes.  Atherosclerotic calcifications of the abdominal aorta and branch vessels.  No abdominopelvic ascites.  No suspicious abdominopelvic lymphadenopathy.  Status post hysterectomy.  No adnexal masses.  Bladder is within normal limits.  Small  fat-containing right inguinal hernia.  Degenerative changes of the visualized thoracolumbar spine.  Prior vertebral augmentation at L2 and L5.  IMPRESSION: Mild small bowel wall thickening with mesenteric stranding in the left mid abdomen.  In the setting of normal lactic acid, this is most suspicious for infectious/inflammatory enteritis.  No evidence of small bowel obstruction.   Original Report Authenticated By: Julian Hy, M.D.    Dg Chest 2 View  03/08/2013  *RADIOLOGY REPORT*  Clinical Data: Hypoxia.  CHEST - 2 VIEW  Comparison: CT chest 02/24/2013. Plain film of the chest 11/25/2012.  Findings: Double-lumen central venous catheter is unchanged in position with its tips in the right atrium.  Small focus of peripheral airspace opacity is identified in the right lower lobe and appears unchanged compared to prior CT.  Left lung is clear. Heart size is normal.  No pneumothorax or pleural effusion.  IMPRESSION:  Peripheral right lower lobe airspace opacity is seen as on prior CT.  The finding cannot be definitively characterized and could be due to focal inflammatory process but repeat PA  and lateral films in two-to-three weeks are recommended to ensure clearing.   Original Report Authenticated By: Orlean Patten, M.D.    Ct Chest Wo Contrast  02/24/2013  *RADIOLOGY REPORT*  Clinical Data: Right lung lesions.  CT CHEST WITHOUT CONTRAST  Technique:  Multidetector CT imaging of the chest was performed following the standard protocol without IV contrast.  Comparison: CT scan dated 06/27/2008  Findings: There is a small focal area of irregular density in the superior lateral aspect of the right lower lobe adjacent to the major fissure.  The appearance is suggestive of an inflammatory process.  There is no hilar or mediastinal adenopathy.  Slight chronic cardiomegaly.  Double-lumen dialysis catheter tips are present in the right atrium.  Slight chronic changes at the left lung base.  No acute osseous abnormality.  No acute abnormalities of the upper abdomen.  Chronic bilateral renal atrophy.  IMPRESSION:  1.  Small ill-defined area of density in the superior lateral aspect of the right lower lobe.  The appearance is typical for an inflammatory process. I recommend a follow-up chest x-ray until clear.   2.  No other acute abnormalities.   Original Report Authenticated By: Lorriane Shire, M.D.    Ct Angio Abdomen W/cm &/or Wo Contrast  03/08/2013  *RADIOLOGY REPORT*  Clinical Data:  NONSPECIFIC SMALL BOWEL WALL THICKENING IN PATIENT WITH KNOWN SIGNIFICANT ATHEROSCLEROTIC DISEASE.  EVALUATE FOR MESENTERIC ISCHEMIA  CT ANGIOGRAPHY ABDOMEN AND PELVIS  Technique:  Multidetector CT imaging of the abdomen and pelvis was performed using the standard protocol during bolus administration of intravenous contrast.  Multiplanar reconstructed images including MIPs were obtained and reviewed to evaluate the vascular anatomy.  Contrast: 163mL OMNIPAQUE IOHEXOL 350 MG/ML SOLN  Comparison:  CT abdomen pelvis - 03/07/2013; 09/09/2012; chest CT - 02/24/2013  Vascular Findings:  Abdominal aorta:  There is rather  extensive primarily calcified atherosclerotic plaque within a normal caliber abdominal aorta measuring approximately 1.4 x 1.3 cm in greatest transverse axial dimension.  No abdominal aortic dissection or periaortic stranding.  Celiac artery:  There is a primarily calcified atherosclerotic plaque involving the origin of the celiac artery which likely approaches 50% luminal narrowing.  Conventional branching pattern.  SMA:  There are tandem eccentric foci of calcified and noncalcified plaque involving the origin and proximal aspect of the SMA none of which individually result in greater than 50% luminal narrowing. The distal tributaries of the SMA  appear patent.  No definite evidence of distal embolic phenomenon.  Right renal artery:  Solitary; expectedly atrophic given history of end-stage renal disease.  Left renal artery:  Solitary; expectedly atrophic given history of end-stage renal disease.  IMA:  Patent.  Left-sided pelvic vasculature:  The left common/external iliac artery stent is widely patent.  The left external iliac artery is widely patent.  The left internal iliac artery is heavily diseased all patent and of normal caliber.  There is an end tendon and left dialysis graft within the left thigh.  Right-sided pelvic vasculature:  There is near circumferential calcified atherosclerotic plaque within the right common iliac artery, not resulting in hemodynamically significant stenosis.  The right external iliac artery is diseased though patent and free of hemodynamically significant narrowing.  The right internal iliac artery is heavily disease are patent and of normal caliber.  Note is again made of a right common femoral vein approach dialysis catheter.  Review of the MIP images confirms the above findings.  ------------------------------------------------------------------- --  Nonvascular findings:  Normal hepatic contour.  No discrete hepatic lesions.  Normal gallbladder.  No intra or extrahepatic  biliary duct dilatation.  No ascites.  The bilateral kidneys are expected atrophic compatible with provided history of end stage renal disease.  No discrete renal lesions.  No renal stones evidence of urinary obstruction.  There is unchanged mild thickening of the lateral limb of the left adrenal gland without discrete nodule.  Normal appearance of the right adrenal gland and spleen.  The pancreas is atrophic.  No peripancreatic stranding.  The previously questioned short segment of bowel wall thickening within a loop of ileum is less conspicuous on the present examination. No discrete areas of bowel wall thickening or mesenteric stranding.  No pneumoperitoneum, pneumatosis or portal venous gas.  Previously administered enteric contrast now extends to the level of the rectum.  No evidence of enteric obstruction.  Shoddy retroperitoneal lymph nodes are numerous though individually are not enlarged by CT criteria.  No pathologically enlarged retroperitoneal, mesenteric, pelvic or inguinal lymph nodes.  Post hysterectomy.  No discrete adnexal lesion.  No free fluid in the pelvis.  Limited visualization of the lower thorax demonstrates minimal increase and peripherally located mass-like consolidation within the subpleural aspect of the right lower lobe, measuring approximately 1.7 x 1.1 cm (image 1, series 7), previously, 1.6 x 0.8 cm.  The structures again associated with a minimal amount of surrounding ground glass.  Unchanged approximately 1.2 x 0.9 cm pneumatocele within the right lower lobe (image 4, series 7). Centrilobular emphysematous change.  Minimal ground-glass atelectasis.  No pleural effusion.  Normal heart size.  The tips of a right femoral approach dialysis catheter terminate within the right atrium.  No pericardial effusion.  No acute or aggressive osseous abnormalities.  Grossly unchanged vertebroplasty/kyphoplasty of L2 and L5 with associated moderate compression deformity of L2 and severe compression  deformity of L5. There is unchanged mild (<25%) compression deformity of the L4 vertebral body.  IMPRESSION:  1.  Interval resolution of previously questioned bowel wall thickening within several loops of small bowel within the left lower abdominal quadrant.  No definite evidence of bowel wall thickening or enteric obstruction.  2.  Extensive atherosclerotic plaque within a normal caliber abdominal aorta.  3.  No definite evidence of mesenteric ischemia.  Atherosclerotic plaque involving the SMA and to a lesser extent, the celiac artery, likely approaching only 50% luminal narrowing.  The IMA remains patent.  No definite evidence of  distal embolic phenomenon within the distal opacified tributaries of the SMA.  4. Left common/external iliac arterial stent is widely patent.  5. The bilateral kidneys and renal arteries are extensively atrophic given history of end-stage renal disease.  Right femoral approach dialysis catheter tips terminating within the right atrium.  Abandoned left thigh dialysis graft, incompletely imaged.  6.  Minimal increase in peripherally located mass-like consolidation within the right lower lobe, now measuring 1.7 x 1.1 cm, previously 1.6 x 0.8 cm.  While this structure is remains indeterminate and possibly of infectious in etiology, given its interval increase, a follow-up chest CT in 4 to 6 weeks after resolution of symptoms is recommended to ensure resolution and exclude the presence of an underlying nodule/mass.  7.  Grossly unchanged sequela of vertebroplasty/kyphoplasty of L2 and L5.  Above findings discussed with Dr. Oneida Alar at [phone].   Original Report Authenticated By: Jake Seats, MD     Microbiology: Recent Results (from the past 240 hour(s))  MRSA PCR SCREENING     Status: None   Collection Time    03/07/13  9:00 PM      Result Value Range Status   MRSA by PCR NEGATIVE  NEGATIVE Final   Comment:            The GeneXpert MRSA Assay (FDA     approved for NASAL specimens      only), is one component of a     comprehensive MRSA colonization     surveillance program. It is not     intended to diagnose MRSA     infection nor to guide or     monitor treatment for     MRSA infections.     Labs: Basic Metabolic Panel:  Recent Labs Lab 03/07/13 0520 03/08/13 0045 03/09/13 1400  NA 131* 131* 129*  K 6.0* 5.3* 4.5  CL 91* 90* 89*  CO2 24 22 23   GLUCOSE 133* 110* 242*  BUN 66* 70* 27*  CREATININE 11.47* 12.02* 8.33*  CALCIUM 10.1 9.5 9.5  PHOS  --  6.6* 5.2*   Liver Function Tests:  Recent Labs Lab 03/07/13 0520 03/08/13 0045 03/09/13 1400  AST 10  --   --   ALT 9  --   --   ALKPHOS 112  --   --   BILITOT 0.3  --   --   PROT 8.2  --   --   ALBUMIN 3.7 3.7 3.1*    Recent Labs Lab 03/07/13 0520  LIPASE 21   No results found for this basename: AMMONIA,  in the last 168 hours CBC:  Recent Labs Lab 03/07/13 0520 03/08/13 0045 03/09/13 1400  WBC 5.7 6.9 4.0  NEUTROABS 3.4  --   --   HGB 12.8 12.1 10.8*  HCT 37.9 35.1* 32.8*  MCV 92.9 92.4 94.3  PLT 249 295 186   Cardiac Enzymes:  Recent Labs Lab 03/07/13 0744  TROPONINI <0.30   BNP: BNP (last 3 results) No results found for this basename: PROBNP,  in the last 8760 hours CBG:  Recent Labs Lab 03/09/13 0741 03/09/13 1156 03/09/13 1804 03/09/13 2027 03/10/13 0803  GLUCAP 87 95 77 195* 105*       Signed:  FELIZ ORTIZ, ABRAHAM  [facility] 03/10/2013, 10:55 AM

## 2013-03-12 ENCOUNTER — Encounter: Payer: Self-pay | Admitting: Vascular Surgery

## 2013-03-13 ENCOUNTER — Ambulatory Visit (INDEPENDENT_AMBULATORY_CARE_PROVIDER_SITE_OTHER): Payer: Medicare Other | Admitting: Vascular Surgery

## 2013-03-13 ENCOUNTER — Encounter (INDEPENDENT_AMBULATORY_CARE_PROVIDER_SITE_OTHER): Payer: Medicare Other | Admitting: *Deleted

## 2013-03-13 ENCOUNTER — Encounter: Payer: Self-pay | Admitting: Vascular Surgery

## 2013-03-13 VITALS — BP 200/65 | HR 63 | Ht <= 58 in | Wt 118.0 lb

## 2013-03-13 DIAGNOSIS — N186 End stage renal disease: Secondary | ICD-10-CM

## 2013-03-13 DIAGNOSIS — Z01810 Encounter for preprocedural cardiovascular examination: Secondary | ICD-10-CM

## 2013-03-13 NOTE — Progress Notes (Signed)
[facility] [place]  Established Dialysis Access  History of Present Illness  [name] is a 79 y.o. (February 10, 1934) female who presents for re-evaluation for permanent access. The patient is right hand dominant. Previous access procedures have been completed in both arms and left leg. She currently dialyzes from a R femoral TDC. 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 The patient has never had a previous PPM placed. The patient has been lost to follow up due to spinal fracture complications.  The patient returns for RUE arterial duplex as part of work-up for a R gore hybrid AVG.  Past Medical History, Past Surgical History, Social History, Family History, Medications, Allergies, and Review of Systems are unchanged from previous visit on 02/20/13.  Physical Examination  Filed Vitals:   03/13/13 0950  BP: 200/65  Pulse: 63  Height: 4\' 10"  (1.473 m)  Weight: 118 lb (53.524 kg)  SpO2: 100%   Body mass index is 24.67 kg/(m^2).  General: A&O x 3, WDWN  Pulmonary: Sym exp, good air movt, CTAB, no rales, rhonchi, & wheezing  Cardiac: RRR, Nl S1, S2, no Murmurs, rubs or gallops  Gastrointestinal: soft, NTND, -G/R, - HSM, - masses, - CVAT B  Musculoskeletal: M/S 5/5 throughout , Extremities without  ischemic changes , R UA looped AVG extended into axilla  Neurologic: Pain and light touch intact in extremities , Motor exam as listed above  Non-Invasive Vascular Imaging  RUE Arterial duplex (Date: 03/13/13):   High arterial bifurcation, triphasic flow throughout  Medical Decision Making  [name] is a 79 y.o. female who presents with ESRD requiring hemodialysis.   Based on vein mapping and examination, this patient's permanent access options include: redo RUA looped AVG with Gore hybrid.  I discussed with the patient, I might not be able to complete the procedure as the  graft extends high into the axilla and might compromise the placement of the stent portion of the graft.  At this point, the patient is reluctant to proceed and would prefer to continue with the right femoral tunneled dialysis catheter for HD.  The patient can follow up with [place] as needed.  [name], MD [facility] [place] Office: [phone] Pager: [phone]  03/13/2013, 10:19 AM

## 2013-04-21 ENCOUNTER — Ambulatory Visit
Admission: RE | Admit: 2013-04-21 | Discharge: 2013-04-21 | Disposition: A | Discharge status: Home / Self Care | Payer: Medicare Other | Source: Ambulatory Visit | Attending: Nephrology | Admitting: Nephrology

## 2013-04-21 ENCOUNTER — Other Ambulatory Visit: Payer: Self-pay | Admitting: Nephrology

## 2013-04-21 DIAGNOSIS — R9389 Abnormal findings on diagnostic imaging of other specified body structures: Secondary | ICD-10-CM

## 2013-04-21 DIAGNOSIS — Z992 Dependence on renal dialysis: Secondary | ICD-10-CM

## 2013-05-28 ENCOUNTER — Encounter: Payer: Self-pay | Admitting: Vascular Surgery

## 2013-05-29 ENCOUNTER — Encounter: Payer: Self-pay | Admitting: Vascular Surgery

## 2013-05-29 ENCOUNTER — Encounter: Payer: Self-pay | Admitting: *Deleted

## 2013-05-29 ENCOUNTER — Ambulatory Visit (INDEPENDENT_AMBULATORY_CARE_PROVIDER_SITE_OTHER): Payer: Medicare Other | Admitting: Vascular Surgery

## 2013-05-29 VITALS — BP 107/38 | HR 59 | Ht <= 58 in | Wt 118.0 lb

## 2013-05-29 DIAGNOSIS — N186 End stage renal disease: Secondary | ICD-10-CM
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
## 2013-06-01 ENCOUNTER — Other Ambulatory Visit: Payer: Self-pay | Admitting: *Deleted

## 2013-06-09 ENCOUNTER — Inpatient Hospital Stay (HOSPITAL_COMMUNITY)
Admission: EM | Admit: 2013-06-09 | Discharge: 2013-06-12 | DRG: 628 | Disposition: A | Discharge status: Home / Self Care | Payer: Medicare Other | Attending: Internal Medicine | Admitting: Internal Medicine

## 2013-06-09 ENCOUNTER — Encounter (HOSPITAL_COMMUNITY): Payer: Self-pay | Admitting: Physical Medicine and Rehabilitation

## 2013-06-09 ENCOUNTER — Other Ambulatory Visit: Payer: Self-pay

## 2013-06-09 ENCOUNTER — Emergency Department (HOSPITAL_COMMUNITY): Payer: Medicare Other

## 2013-06-09 DIAGNOSIS — Z992 Dependence on renal dialysis: Secondary | ICD-10-CM

## 2013-06-09 DIAGNOSIS — D649 Anemia, unspecified: Secondary | ICD-10-CM | POA: Diagnosis present

## 2013-06-09 DIAGNOSIS — M549 Dorsalgia, unspecified: Secondary | ICD-10-CM | POA: Diagnosis present

## 2013-06-09 DIAGNOSIS — E1122 Type 2 diabetes mellitus with diabetic chronic kidney disease: Secondary | ICD-10-CM | POA: Diagnosis present

## 2013-06-09 DIAGNOSIS — R262 Difficulty in walking, not elsewhere classified: Secondary | ICD-10-CM

## 2013-06-09 DIAGNOSIS — M109 Gout, unspecified: Secondary | ICD-10-CM

## 2013-06-09 DIAGNOSIS — E871 Hypo-osmolality and hyponatremia: Secondary | ICD-10-CM | POA: Diagnosis present

## 2013-06-09 DIAGNOSIS — Y849 Medical procedure, unspecified as the cause of abnormal reaction of the patient, or of later complication, without mention of misadventure at the time of the procedure: Secondary | ICD-10-CM | POA: Diagnosis present

## 2013-06-09 DIAGNOSIS — Z9115 Patient's noncompliance with renal dialysis: Secondary | ICD-10-CM

## 2013-06-09 DIAGNOSIS — I10 Essential (primary) hypertension: Secondary | ICD-10-CM | POA: Diagnosis present

## 2013-06-09 DIAGNOSIS — I120 Hypertensive chronic kidney disease with stage 5 chronic kidney disease or end stage renal disease: Secondary | ICD-10-CM | POA: Diagnosis present

## 2013-06-09 DIAGNOSIS — R112 Nausea with vomiting, unspecified: Secondary | ICD-10-CM | POA: Diagnosis present

## 2013-06-09 DIAGNOSIS — E119 Type 2 diabetes mellitus without complications: Secondary | ICD-10-CM

## 2013-06-09 DIAGNOSIS — G8929 Other chronic pain: Secondary | ICD-10-CM | POA: Diagnosis present

## 2013-06-09 DIAGNOSIS — Z79899 Other long term (current) drug therapy: Secondary | ICD-10-CM

## 2013-06-09 DIAGNOSIS — Z87891 Personal history of nicotine dependence: Secondary | ICD-10-CM

## 2013-06-09 DIAGNOSIS — Z48812 Encounter for surgical aftercare following surgery on the circulatory system: Secondary | ICD-10-CM

## 2013-06-09 DIAGNOSIS — R002 Palpitations: Secondary | ICD-10-CM

## 2013-06-09 DIAGNOSIS — R000 Tachycardia, unspecified: Secondary | ICD-10-CM

## 2013-06-09 DIAGNOSIS — E785 Hyperlipidemia, unspecified: Secondary | ICD-10-CM | POA: Diagnosis present

## 2013-06-09 DIAGNOSIS — E875 Hyperkalemia: Principal | ICD-10-CM | POA: Diagnosis present

## 2013-06-09 DIAGNOSIS — M129 Arthropathy, unspecified: Secondary | ICD-10-CM | POA: Diagnosis present

## 2013-06-09 DIAGNOSIS — Z91158 Patient's noncompliance with renal dialysis for other reason: Secondary | ICD-10-CM

## 2013-06-09 DIAGNOSIS — Z90710 Acquired absence of both cervix and uterus: Secondary | ICD-10-CM

## 2013-06-09 DIAGNOSIS — R4189 Other symptoms and signs involving cognitive functions and awareness: Secondary | ICD-10-CM | POA: Diagnosis not present

## 2013-06-09 DIAGNOSIS — N186 End stage renal disease: Secondary | ICD-10-CM | POA: Diagnosis present

## 2013-06-09 DIAGNOSIS — R066 Hiccough: Secondary | ICD-10-CM | POA: Diagnosis present

## 2013-06-09 DIAGNOSIS — I739 Peripheral vascular disease, unspecified: Secondary | ICD-10-CM | POA: Diagnosis present

## 2013-06-09 DIAGNOSIS — E079 Disorder of thyroid, unspecified: Secondary | ICD-10-CM | POA: Diagnosis present

## 2013-06-09 DIAGNOSIS — S98919A Complete traumatic amputation of unspecified foot, level unspecified, initial encounter: Secondary | ICD-10-CM

## 2013-06-09 DIAGNOSIS — I70219 Atherosclerosis of native arteries of extremities with intermittent claudication, unspecified extremity: Secondary | ICD-10-CM

## 2013-06-09 DIAGNOSIS — K551 Chronic vascular disorders of intestine: Secondary | ICD-10-CM

## 2013-06-09 DIAGNOSIS — I059 Rheumatic mitral valve disease, unspecified: Secondary | ICD-10-CM

## 2013-06-09 DIAGNOSIS — K55059 Acute (reversible) ischemia of intestine, part and extent unspecified: Secondary | ICD-10-CM

## 2013-06-09 DIAGNOSIS — T82898A Other specified complication of vascular prosthetic devices, implants and grafts, initial encounter: Secondary | ICD-10-CM | POA: Diagnosis present

## 2013-06-09 DIAGNOSIS — N2581 Secondary hyperparathyroidism of renal origin: Secondary | ICD-10-CM | POA: Diagnosis present

## 2013-06-09 DIAGNOSIS — J449 Chronic obstructive pulmonary disease, unspecified: Secondary | ICD-10-CM

## 2013-06-09 DIAGNOSIS — R072 Precordial pain: Secondary | ICD-10-CM

## 2013-06-09 DIAGNOSIS — J4489 Other specified chronic obstructive pulmonary disease: Secondary | ICD-10-CM

## 2013-06-09 DIAGNOSIS — I871 Compression of vein: Secondary | ICD-10-CM | POA: Diagnosis present

## 2013-06-09 DIAGNOSIS — Z9889 Other specified postprocedural states: Secondary | ICD-10-CM

## 2013-06-09 DIAGNOSIS — R404 Transient alteration of awareness: Secondary | ICD-10-CM | POA: Diagnosis not present

## 2013-06-09 DIAGNOSIS — K529 Noninfective gastroenteritis and colitis, unspecified: Secondary | ICD-10-CM

## 2013-06-09 DIAGNOSIS — I498 Other specified cardiac arrhythmias: Secondary | ICD-10-CM

## 2013-06-09 DIAGNOSIS — Z7982 Long term (current) use of aspirin: Secondary | ICD-10-CM

## 2013-06-09 LAB — BASIC METABOLIC PANEL
BUN: 33 mg/dL — ABNORMAL HIGH (ref 6–23)
Creatinine, Ser: 5.67 mg/dL — ABNORMAL HIGH (ref 0.50–1.10)
GFR calc Af Amer: 8 mL/min — ABNORMAL LOW (ref >90)
GFR calc non Af Amer: 6 mL/min — ABNORMAL LOW (ref >90)
Potassium: 3.8 mEq/L (ref 3.5–5.1)

## 2013-06-09 LAB — COMPREHENSIVE METABOLIC PANEL
ALT: 14 U/L (ref 0–35)
AST: 18 U/L (ref 0–37)
Alkaline Phosphatase: 128 U/L — ABNORMAL HIGH (ref 39–117)
CO2: 24 mEq/L (ref 19–32)
GFR calc Af Amer: 3 mL/min — ABNORMAL LOW (ref >90)
Glucose, Bld: 127 mg/dL — ABNORMAL HIGH (ref 70–99)
Potassium: 7.5 mEq/L (ref 3.5–5.1)
Sodium: 133 mEq/L — ABNORMAL LOW (ref 135–145)
Total Protein: 9.8 g/dL — ABNORMAL HIGH (ref 6.0–8.3)

## 2013-06-09 LAB — CBC WITH DIFFERENTIAL/PLATELET
Basophils Absolute: 0 10*3/uL (ref 0.0–0.1)
Eosinophils Absolute: 0 10*3/uL (ref 0.0–0.7)
Lymphocytes Relative: 10 % — ABNORMAL LOW (ref 12–46)
Lymphs Abs: 0.9 10*3/uL (ref 0.7–4.0)
Neutrophils Relative %: 86 % — ABNORMAL HIGH (ref 43–77)
Platelets: 239 10*3/uL (ref 150–400)
RBC: 3.8 MIL/uL — ABNORMAL LOW (ref 3.87–5.11)
WBC: 8.7 10*3/uL (ref 4.0–10.5)

## 2013-06-09 LAB — POCT I-STAT, CHEM 8
Calcium, Ion: 1.07 mmol/L — ABNORMAL LOW (ref 1.13–1.30)
Creatinine, Ser: 12.5 mg/dL — ABNORMAL HIGH (ref 0.50–1.10)
Glucose, Bld: 129 mg/dL — ABNORMAL HIGH (ref 70–99)
HCT: 41 % (ref 36.0–46.0)
Hemoglobin: 13.9 g/dL (ref 12.0–15.0)

## 2013-06-09 LAB — DIFFERENTIAL
Basophils Absolute: 0 10*3/uL (ref 0.0–0.1)
Basophils Relative: 0 % (ref 0–1)
Eosinophils Relative: 0 % (ref 0–5)
Monocytes Absolute: 0.7 10*3/uL (ref 0.1–1.0)

## 2013-06-09 MED ORDER — ACETAMINOPHEN 650 MG RE SUPP: 650.0000 mg | Freq: Four times a day (QID) | RECTAL | Status: DC | PRN

## 2013-06-09 MED ORDER — SODIUM CHLORIDE 0.9 % IV SOLN: 100.0000 mL | INTRAVENOUS | Status: DC | PRN

## 2013-06-09 MED ORDER — ONDANSETRON HCL 4 MG PO TABS: 4.0000 mg | ORAL_TABLET | Freq: Four times a day (QID) | ORAL | Status: DC | PRN

## 2013-06-09 MED ORDER — MORPHINE SULFATE 4 MG/ML IJ SOLN: 4.0000 mg | Freq: Once | INTRAMUSCULAR | Status: DC

## 2013-06-09 MED ORDER — PENTAFLUOROPROP-TETRAFLUOROETH EX AERO: 1.0000 "application " | INHALATION_SPRAY | CUTANEOUS | Status: DC | PRN

## 2013-06-09 MED ORDER — PANTOPRAZOLE SODIUM 40 MG IV SOLR
40.0000 mg | Freq: Two times a day (BID) | INTRAVENOUS | Status: DC
  Administered 2013-06-09: 40 mg via INTRAVENOUS
  Filled 2013-06-09 (×3): qty 40

## 2013-06-09 MED ORDER — HYDRALAZINE HCL 20 MG/ML IJ SOLN: 10.0000 mg | Freq: Four times a day (QID) | INTRAMUSCULAR | Status: DC | PRN

## 2013-06-09 MED ORDER — SODIUM POLYSTYRENE SULFONATE 15 GM/60ML PO SUSP
45.0000 g | Freq: Once | ORAL | Status: AC
  Administered 2013-06-09: 45 g via ORAL
  Filled 2013-06-09: qty 120
  Filled 2013-06-09: qty 60

## 2013-06-09 MED ORDER — LIDOCAINE-PRILOCAINE 2.5-2.5 % EX CREA: 1.0000 "application " | TOPICAL_CREAM | CUTANEOUS | Status: DC | PRN

## 2013-06-09 MED ORDER — ACETAMINOPHEN 325 MG PO TABS
650.0000 mg | ORAL_TABLET | Freq: Four times a day (QID) | ORAL | Status: DC | PRN
  Administered 2013-06-09: 650 mg via ORAL

## 2013-06-09 MED ORDER — SENNOSIDES-DOCUSATE SODIUM 8.6-50 MG PO TABS
1.0000 | ORAL_TABLET | Freq: Every evening | ORAL | Status: DC | PRN
  Filled 2013-06-09: qty 1

## 2013-06-09 MED ORDER — CLONIDINE HCL 0.2 MG/24HR TD PTWK
0.2000 mg | MEDICATED_PATCH | TRANSDERMAL | Status: DC
  Administered 2013-06-09: 0.2 mg via TRANSDERMAL
  Filled 2013-06-09: qty 1

## 2013-06-09 MED ORDER — LIDOCAINE HCL (PF) 1 % IJ SOLN: 5.0000 mL | INTRAMUSCULAR | Status: DC | PRN

## 2013-06-09 MED ORDER — SODIUM CHLORIDE 0.9 % IV SOLN
INTRAVENOUS | Status: AC
  Administered 2013-06-09: 22:00:00 via INTRAVENOUS

## 2013-06-09 MED ORDER — HEPARIN SODIUM (PORCINE) 5000 UNIT/ML IJ SOLN
5000.0000 [IU] | Freq: Three times a day (TID) | INTRAMUSCULAR | Status: DC
Start: 2013-06-09 — End: 2013-06-12
  Administered 2013-06-09 – 2013-06-11 (×5): 5000 [IU] via SUBCUTANEOUS
  Filled 2013-06-09 (×11): qty 1

## 2013-06-09 MED ORDER — SODIUM CHLORIDE 0.9 % IJ SOLN
3.0000 mL | Freq: Two times a day (BID) | INTRAMUSCULAR | Status: DC
  Administered 2013-06-09 – 2013-06-12 (×4): 3 mL via INTRAVENOUS

## 2013-06-09 MED ORDER — ALTEPLASE 2 MG IJ SOLR
2.0000 mg | Freq: Once | INTRAMUSCULAR | Status: DC | PRN
  Filled 2013-06-09: qty 2

## 2013-06-09 MED ORDER — HEPARIN SODIUM (PORCINE) 1000 UNIT/ML DIALYSIS: 1000.0000 [IU] | INTRAMUSCULAR | Status: DC | PRN

## 2013-06-09 MED ORDER — NEPRO/CARBSTEADY PO LIQD
237.0000 mL | ORAL | Status: DC | PRN
  Filled 2013-06-09: qty 237

## 2013-06-09 MED ORDER — RENA-VITE PO TABS
1.0000 | ORAL_TABLET | Freq: Every day | ORAL | Status: DC
  Administered 2013-06-10 – 2013-06-12 (×3): 1 via ORAL
  Filled 2013-06-09 (×4): qty 1

## 2013-06-09 MED ORDER — SEVELAMER CARBONATE 800 MG PO TABS
2400.0000 mg | ORAL_TABLET | Freq: Three times a day (TID) | ORAL | Status: DC
  Administered 2013-06-10 – 2013-06-12 (×4): 2400 mg via ORAL
  Filled 2013-06-09 (×11): qty 3

## 2013-06-09 MED ORDER — DOXERCALCIFEROL 4 MCG/2ML IV SOLN: 3.0000 ug | INTRAVENOUS | Status: DC

## 2013-06-09 MED ORDER — HEPARIN SODIUM (PORCINE) 1000 UNIT/ML DIALYSIS
7000.0000 [IU] | INTRAMUSCULAR | Status: DC | PRN
  Administered 2013-06-09: 7000 [IU] via INTRAVENOUS_CENTRAL
  Filled 2013-06-09: qty 7

## 2013-06-09 MED ORDER — ONDANSETRON HCL 4 MG/2ML IJ SOLN
4.0000 mg | Freq: Four times a day (QID) | INTRAMUSCULAR | Status: DC | PRN
  Administered 2013-06-09: 4 mg via INTRAVENOUS
  Filled 2013-06-09: qty 2

## 2013-06-09 MED ORDER — DOXERCALCIFEROL 4 MCG/2ML IV SOLN: 3.0000 ug | Freq: Once | INTRAVENOUS | Status: DC

## 2013-06-09 MED ORDER — ONDANSETRON HCL 4 MG/2ML IJ SOLN
4.0000 mg | Freq: Once | INTRAMUSCULAR | Status: AC
  Administered 2013-06-09: 4 mg via INTRAMUSCULAR
  Filled 2013-06-09: qty 2

## 2013-06-09 NOTE — ED Notes (Signed)
Results of chem 8 given to the primary nurse

## 2013-06-09 NOTE — Consult Note (Signed)
[facility] Renal Consultation Note  Indication for Consultation:  Management of ESRD/hemodialysis; anemia, hypertension/volume and secondary hyperparathyroidism  HPI: [name] is a 79 y.o. female with ESRD on dialysis on MWF at the [facility] who presented to the ED today with nausea, vomiting, and weakness for two days.  She missed her scheduled treatment yesterday, secondary to her symptoms, and currently has a potassium of 7.5, requiring emergent dialysis. She has a right femoral dialysis catheter which has recently been functioning poorly with low BFRs.  Dr. Bridgett Larsson of [facility] saw her during an office visit on 5/30 and scheduled a right iliac and central venography (to decide where to put permanent dialysis access), likely fibrin sheath angioplasty, and exchange of her tunneled dialysis catheter on 6/12.  She will be moved from the ED for emergent dialysis.   Dialysis Orders: Center: [place] on MWF. EDW 54 kg  HD Bath 2K/2.25Ca  Time 3 hrs 45 mins   Heparin 7000-U bolus, then 3000 U mid-Tx.  Access Right femoral catheter   BFR 400 DFR 800  Hectorol 3 mcg IV/HD  Epogen 1000 Units IV/HD  Venofer 0   Profile 4.  Past Medical History  Diagnosis Date  . Hypertension   . Gout, unspecified 1980's    "not anymore" (11/25/2012)  . Mild mitral valve stenosis   . Thyroid disease   . Gangrene     left fifth toe  . Peripheral vascular disease   . Hyperlipidemia   . Aortic valve sclerosis   . Shoulder fracture, right 2012  . Blood transfusion 1960's  . Chronic back pain   . Other specified cardiac dysrhythmias(427.89)     sees Dr. Angelena Form  . Heart murmur     "I think so" (11/25/2012)  . Anginal pain   . Asthma     "used to; not anymore" (11/25/2012)  . Pneumonia     "once; years ago" (11/25/2012)  . Type II diabetes mellitus     "states not on any medication at this time"  . Anemia   . Arthritis     "all over" (11/25/2012)  . ESRD (end stage renal  disease) on dialysis     M, W, Fr. [place] dialysis (11/25/2012)   Past Surgical History  Procedure Laterality Date  . Av fistula repair      "right/left arm fistula failed; removed left thigh d/t poor circulation" (11/25/2012)  . Arteriovenous graft placement      left femoral loop arteriovenous Gore-Tex graft.  . Av fistula placement  2008- 2013    "left upper arm; twice in my neck; left leg; removed from left leg; right upper arm" (11/25/2012)  . Foot amputation through metatarsal  06/22/11    "left foot; whole 5th toe" (11/25/2012)  . Pr vein bypass graft,aorto-fem-pop  06/14/2011  . Femoral-popliteal bypass graft  04/11/2012    Procedure: BYPASS GRAFT FEMORAL-POPLITEAL ARTERY;  Surgeon: [name], MD;  Location: [place];  Service: Vascular;  Laterality: Left;  Thrombectomy/Left femoral-popliteal bypass with revision of proximal end and shortening of graft; intraoperative arteriogram; endarterectomy and patch angioplasty with distal anastomosis  . Appendectomy  1960's  . Refractive surgery      "both eyes" (11/25/2012)  . Femoral-popliteal bypass graft  10/09/2012    Procedure: BYPASS GRAFT FEMORAL-POPLITEAL ARTERY;  Surgeon: [name], MD;  Location: [place];  Service: Vascular;  Laterality: Left;  Redo left tibioperoneal trunk bypass with Gortex Graft 63mmx80cm.  . Kyphoplasty  11/25/2012    balloon, L2, L5  . Total abdominal hysterectomy  1960's    one ovary removed  . Kyphoplasty  11/25/2012    Procedure: KYPHOPLASTY;  Surgeon: Kristeen Miss, MD;  Location: [place] [facility];  Service: Neurosurgery;  Laterality: N/A;  Lumbar two lumbar five Kyphoplasty  . Colonoscopy  2014?   Family History  Problem Relation Age of Onset  . Peripheral vascular disease Mother     amputation  . Hypertension Mother   . Diabetes Mother    Social History She quit smoking cigarettes over 20 years ago and admits drinking alcohol and smoking marijuana years ago, but denies any recent use.   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    Allergies  Allergen Reactions  . Ace Inhibitors Other (See Comments)    Reaction unknown   Prior to Admission medications   Medication Sig Start Date End Date Taking? Authorizing Provider  amLODipine (NORVASC) 10 MG tablet Take 10 mg by mouth daily.   Yes Historical Provider, MD  calcium acetate (PHOSLO) 667 MG capsule Take 2,001 mg by mouth 3 (three) times daily with meals.  09/25/12  Yes Historical Provider, MD  calcium carbonate (TUMS - DOSED IN MG ELEMENTAL CALCIUM) 500 MG chewable tablet Chew 1 tablet by mouth as needed for heartburn.    Yes Historical Provider, MD  cloNIDine (CATAPRES) 0.2 MG tablet Take 0.2 mg by mouth 2 (two) times daily.  03/09/13  Yes Historical Provider, MD  losartan (COZAAR) 100 MG tablet Take 100 mg by mouth every evening.  02/23/13  Yes Historical Provider, MD  metoprolol succinate (TOPROL-XL) 50 MG 24 hr tablet Take 50 mg by mouth daily. Take with or immediately following a meal. 04/15/12  Yes Samantha J Rhyne, PA-C  pantoprazole (PROTONIX) 40 MG tablet Take 40 mg by mouth daily.   Yes Historical Provider, MD  tiZANidine (ZANAFLEX) 4 MG tablet Take 4 mg by mouth 2 (two) times daily as needed (spasms).  03/09/13  Yes Historical Provider, MD  traMADol (ULTRAM) 50 MG tablet Take 50-100 mg by mouth every 6 (six) hours as needed. For pain   Yes Historical Provider, MD   Labs:  Results for orders placed during the hospital encounter of 06/09/13 (from the past 48 hour(s))  GLUCOSE, CAPILLARY     Status: Abnormal   Collection Time    06/09/13  1:40 PM      Result Value Range   Glucose-Capillary 117 (*) 70 - 99 mg/dL   Comment 1 Documented in Chart     Comment 2 Notify RN    CBC WITH DIFFERENTIAL     Status: Abnormal   Collection Time    06/09/13  2:27 PM      Result Value Range   WBC 8.7  4.0 - 10.5 K/uL   RBC 3.80 (*)  3.87 - 5.11 MIL/uL   Hemoglobin 12.1  12.0 - 15.0 g/dL   HCT 36.3  36.0 - 46.0 %   MCV 95.5  78.0 - 100.0 fL   MCH 31.8  26.0 - 34.0 pg   MCHC 33.3  30.0 - 36.0 g/dL   RDW 14.2  11.5 - 15.5 %   Platelets 239  150 - 400 K/uL   Neutrophils Relative % 86 (*) 43 - 77 %   Neutro Abs 7.5  1.7 - 7.7 K/uL   Lymphocytes Relative 10 (*)  12 - 46 %   Lymphs Abs 0.9  0.7 - 4.0 K/uL   Monocytes Relative 4  3 - 12 %   Monocytes Absolute 0.3  0.1 - 1.0 K/uL   Eosinophils Relative 0  0 - 5 %   Eosinophils Absolute 0.0  0.0 - 0.7 K/uL   Basophils Relative 0  0 - 1 %   Basophils Absolute 0.0  0.0 - 0.1 K/uL  COMPREHENSIVE METABOLIC PANEL     Status: Abnormal   Collection Time    06/09/13  2:27 PM      Result Value Range   Sodium 133 (*) 135 - 145 mEq/L   Potassium 7.5 (*) 3.5 - 5.1 mEq/L   Comment: REPEATED TO VERIFY     CRITICAL RESULT CALLED TO, READ BACK BY AND VERIFIED WITH:     J.GAGE,RN 1523 06/09/13 CLARK,S     NO VISIBLE HEMOLYSIS   Chloride 83 (*) 96 - 112 mEq/L   CO2 24  19 - 32 mEq/L   Glucose, Bld 127 (*) 70 - 99 mg/dL   BUN 100 (*) 6 - 23 mg/dL   Creatinine, Ser 13.00 (*) 0.50 - 1.10 mg/dL   Calcium 11.0 (*) 8.4 - 10.5 mg/dL   Total Protein 9.8 (*) 6.0 - 8.3 g/dL   Albumin 4.7  3.5 - 5.2 g/dL   AST 18  0 - 37 U/L   ALT 14  0 - 35 U/L   Alkaline Phosphatase 128 (*) 39 - 117 U/L   Total Bilirubin 0.3  0.3 - 1.2 mg/dL   GFR calc non Af Amer 2 (*) >90 mL/min   GFR calc Af Amer 3 (*) >90 mL/min   Comment:            The eGFR has been calculated     using the CKD EPI equation.     This calculation has not been     validated in all clinical     situations.     eGFR's persistently     <90 mL/min signify     possible Chronic Kidney Disease.  POCT I-STAT, CHEM 8     Status: Abnormal   Collection Time    06/09/13  2:27 PM      Result Value Range   Sodium 131 (*) 135 - 145 mEq/L   Potassium 7.4 (*) 3.5 - 5.1 mEq/L   Chloride 95 (*) 96 - 112 mEq/L   BUN 101 (*) 6 - 23 mg/dL    Creatinine, Ser 12.50 (*) 0.50 - 1.10 mg/dL   Glucose, Bld 129 (*) 70 - 99 mg/dL   Calcium, Ion 1.07 (*) 1.13 - 1.30 mmol/L   TCO2 27  0 - 100 mmol/L   Hemoglobin 13.9  12.0 - 15.0 g/dL   HCT 41.0  36.0 - 46.0 %   Comment NOTIFIED PHYSICIAN     Constitutional: positive for fatigue, negative for chills, fevers and sweats Ears, nose, mouth, throat, and face: negative for earaches, hoarseness, nasal congestion and sore throat Respiratory: negative for cough, dyspnea on exertion, hemoptysis and sputum Cardiovascular: negative for chest pain, chest pressure/discomfort, dyspnea, orthopnea and palpitations Gastrointestinal: positive for nausea and vomiting, negative for abdominal pain and change in bowel habits Genitourinary:negative, anuric Musculoskeletal:negative for arthralgias, back pain, myalgias and neck pain Neurological: negative for coordination problems, dizziness, headaches, paresthesia and speech problems  Physical Exam: Filed Vitals:   06/09/13 1421  BP: 186/56  Pulse: 85  Temp: 97.6 F (36.4 C)  Resp:  20     General appearance: alert, cooperative and no distress Head: Normocephalic, without obvious abnormality, atraumatic Neck: no adenopathy, no carotid bruit, no JVD and supple, symmetrical, trachea midline Resp: clear to auscultation bilaterally Cardio: regular rate and rhythm, S1, S2 normal, no murmur, click, rub or gallop GI: soft, non-tender; bowel sounds normal; no masses,  no organomegaly Extremities: no edema or ischemic changes, s/p left 5th toe amputation, old nonfct vascular accesses Neurologic: Grossly normal Dialysis Access: Right femoral tunneled dialysis catheter   Assessment/Plan: 1. Nausea & vomiting - since 6/8, last HD 6/6, recently with poor treatments secondary to poorly functioning catheter; afebrile, WBCs 8.7, chest x-ray negative.  May need PPI +/- metoclopramide 2. Hyperkalemia - K 7.5 today, secondary to missed HD.  1 K, 2.5 Ca dialysate.  Only  able to get 250 BFR in cath in dialysis now.    3. Poorly functioning dialysis catheter - seen by Dr. Bridgett Larsson of [facility] 5/30, scheduled R iliac & central venography (to determine where to put next permanent access), likely fibrin sheath angioplasty, and catheter exchange on 6/12.  [facility] contracted about pt's admission. 4. ESRD -  HD on MWF @ [facility] [place], missed HD yesterday (last HD 6/6).  HD now  (just got on) 5. Hypertension/volume  - BP 175/82; chest x-ray negative, current wt 55.9 kg with EDW 55.  6. Anemia  - Hgb 12.1, on outpatient Epogen [id number] U.   7. Metabolic bone disease -  Ca 11, last P 5.7, iPTH 744 (4/23); Hectorol 3 mcg, Phoslo 3 with meals.  DC Hectorol and switch to Renvela. 8. Nutrition - Alb 4.7, renal diet, vitamin. 9. DM - per primary.  [name],CHARLES 06/09/2013, 4:02 PM   Attending Nephrologist: Salem Senate, MD I spoke with and examined [name], reviewed her records and results.  I'm hopeful BFR 250 with 1 K bath will decrease K.  She'll need new cath or removal of fibrin sheath if one is present. Agree with plans as outlined above.

## 2013-06-09 NOTE — ED Notes (Signed)
Pt presents to department for evaluation of nausea/vomiting and generalized weakness. HD patient, states she hasn't had dialysis x4 days. Treatments on Mon, Wed, Fri. R upper leg catheter. Pt is alert and oriented x4. Denies pain at the time.

## 2013-06-09 NOTE — Progress Notes (Signed)
Hemodialysis-Pt arrived from ED on stretcher. Very weak, complains she has been nauseated but is not now. Placed on 1K bath for high K. Dr. Hassell Done notified, unable to obtain flows >250-300 from HD cath. BP drop during treatment frequently into 80s/30s, resolved with saline bolus. Unable to meet 1.9L goal d/t this. Pt also had frequent bowel movements while on HD. Given kayexalate in ED per [facility]. Still complains of mild discomfort in stomach. Transferred to 6700 in stable condition. Pt immediately taken to restroom. Telemetry box placed. Pt told to call and to not try and ambulate back to bed herself d/t weakness. Report given to [facility] RN on 6700.

## 2013-06-09 NOTE — ED Notes (Signed)
Pt refusing for IV and blood test.
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
## 2013-06-09 NOTE — Procedures (Signed)
[facility]  On HD via R fem TDC --250 BFR on 1 K, 2.5 Ca bath BP--167/105 Goal--1.9 L Hgb--12.1   K+--7.5--no peaked T waves on EKG except small T wave in lead III

## 2013-06-09 NOTE — H&P (Signed)
[facility] History and Physical  [name] [id number] DOB: 24-Apr-1934 DOA: 06/09/2013  Referring physician: Dr. Monia Pouch PCP: Margaret Austria, MD  Specialists: Nephrology  Chief Complaint: Vomiting  HPI: [name] is a 79 y.o. female with prior h/o ESRD on HD (M/W/F), hypertension, severe PVD, and diet controlled DM presents with vomiting that started on Sunday 6/8.  She is unable to keep down water or any of her medications.  She has not had a bowel movement since Sunday.  She has not had any blood or coffee grounds in her emesis.  She is also complaining of epigastric pain - although she states that it is not as severe as her hospitalization in March 2014 (Enteritis).  She did not go to HD on Monday 6/9.   In the emergency department she is found to have a serum potassium of 7.4.  She admits to indulging in a lot of potassium rich foods lately (bananas, oranges, tomatoes).  Finally she reports falling 4 x in the past few months.  She lives with her grand-daughter but is alone greater that 50% of the time.  Review of Systems: The patient denies chest pain, shortness of breath, changes in vision, anorexia, changes in vision.  No sick contacts, no recent fever or cold symptoms.   She endorses bilateral hip pain.  All other systems were reviewed and found to be negative.   Past Medical History  Diagnosis Date  . Hypertension   . Gout, unspecified 1980's    "not anymore" (11/25/2012)  . Mild mitral valve stenosis   . Thyroid disease   . Gangrene     left fifth toe  . Peripheral vascular disease   . Hyperlipidemia   . Aortic valve sclerosis   . Shoulder fracture, right 2012  . Blood transfusion 1960's  . Chronic back pain   . Other specified cardiac dysrhythmias(427.89)     sees Dr. Angelena [name]  . Heart murmur     "I think so" (11/25/2012)  . Anginal pain   . Asthma     "used to; not anymore" (11/25/2012)  . Pneumonia     "once; years ago" (11/25/2012)  .  Type II diabetes mellitus     "states not on any medication at this time"  . Anemia   . Arthritis     "all over" (11/25/2012)  . ESRD (end stage renal disease) on dialysis     M, W, Fr. [place] dialysis (11/25/2012)   Past Surgical History  Procedure Laterality Date  . Av fistula repair      "right/left arm fistula failed; removed left thigh d/t poor circulation" (11/25/2012)  . Arteriovenous graft placement      left femoral loop arteriovenous Gore-Tex graft.  . Av fistula placement  2008- 2013    "left upper arm; twice in my neck; left leg; removed from left leg; right upper arm" (11/25/2012)  . Foot amputation through metatarsal  06/22/11    "left foot; whole 5th toe" (11/25/2012)  . Pr vein bypass graft,aorto-fem-pop  06/14/2011  . Femoral-popliteal bypass graft  04/11/2012    Procedure: BYPASS GRAFT FEMORAL-POPLITEAL ARTERY;  Surgeon: [name], MD;  Location: [facility];  Service: Vascular;  Laterality: Left;  Thrombectomy/Left femoral-popliteal bypass with revision of proximal end and shortening of graft; intraoperative arteriogram; endarterectomy and patch angioplasty with distal anastomosis  . Appendectomy  1960's  . Refractive surgery      "both eyes" (11/25/2012)  . Femoral-popliteal bypass graft  10/09/2012  Procedure: BYPASS GRAFT FEMORAL-POPLITEAL ARTERY;  Surgeon: [name], MD;  Location: [facility];  Service: Vascular;  Laterality: Left;  Redo left tibioperoneal trunk bypass with Gortex Graft 72mmx80cm.  . Kyphoplasty  11/25/2012    balloon, L2, L5  . Total abdominal hysterectomy  1960's    one ovary removed  . Kyphoplasty  11/25/2012    Procedure: KYPHOPLASTY;  Surgeon: Margaret Miss, MD;  Location: [facility];  Service: Neurosurgery;  Laterality: N/A;  Lumbar two lumbar five Kyphoplasty  . Colonoscopy  2014?   Social History:  reports that she quit smoking about 24 years ago. Her smoking use included Cigarettes. She has a 1.8 pack-year smoking history. She  has never used smokeless tobacco. She reports that she does not drink alcohol or use illicit drugs.   Allergies  Allergen Reactions  . Ace Inhibitors Other (See Comments)    Reaction unknown    Family History  Problem Relation Age of Onset  . Peripheral vascular disease Mother     amputation  . Hypertension Mother   . Diabetes Mother     Prior to Admission medications   Medication Sig Start Date End Date Taking? Authorizing Provider  amLODipine (NORVASC) 10 MG tablet Take 10 mg by mouth daily.   Yes Historical Provider, MD  calcium acetate (PHOSLO) 667 MG capsule Take 2,001 mg by mouth 3 (three) times daily with meals.  09/25/12  Yes Historical Provider, MD  calcium carbonate (TUMS - DOSED IN MG ELEMENTAL CALCIUM) 500 MG chewable tablet Chew 1 tablet by mouth as needed for heartburn.    Yes Historical Provider, MD  cloNIDine (CATAPRES) 0.2 MG tablet Take 0.2 mg by mouth 2 (two) times daily.  03/09/13  Yes Historical Provider, MD  losartan (COZAAR) 100 MG tablet Take 100 mg by mouth every evening.  02/23/13  Yes Historical Provider, MD  metoprolol succinate (TOPROL-XL) 50 MG 24 hr tablet Take 50 mg by mouth daily. Take with or immediately following a meal. 04/15/12  Yes Margaret J Rhyne, PA-C  pantoprazole (PROTONIX) 40 MG tablet Take 40 mg by mouth daily.   Yes Historical Provider, MD  tiZANidine (ZANAFLEX) 4 MG tablet Take 4 mg by mouth 2 (two) times daily as needed (spasms).  03/09/13  Yes Historical Provider, MD  traMADol (ULTRAM) 50 MG tablet Take 50-100 mg by mouth every 6 (six) hours as needed. For pain   Yes Historical Provider, MD   Physical Exam: Filed Vitals:   06/09/13 1203 06/09/13 1421 06/09/13 1618  BP: 160/77 186/56 175/82  Pulse: 93 85   Temp: 98.1 F (36.7 C) 97.6 F (36.4 C)   TempSrc: Oral Oral   Resp: 20 20   SpO2: 97% 99% 100%     General:  Thin, frail, female, A&O, slightly anxious  Eyes: PERR, Sclera clear  ENT: Oropharynx without erythema or  exudates.  Neck: supple without lymphadenopathy  Cardiovascular: Slightly irregular with systolic murmur, + JVP  Respiratory: CTA no w/c/r, no accessory muscle use  Abdomen: thin,soft, slightly distended in the epigastrum, nT, +BS  Skin: Multiple well healed scars in her extremities from previous HD access  Musculoskeletal: able to move all 4 extremities, 5/5 strength in each   Psychiatric: A&O, Cooperative, well groomed.  Neurologic: slight facial twitch, CN 2-12 grossly in tact. Other wise non focal.  Labs on Admission:  Basic Metabolic Panel:  Recent Labs Lab 06/09/13 1427  NA 133*  131*  K 7.5*  7.4*  CL 83*  95*  CO2 24  GLUCOSE 127*  129*  BUN 100*  101*  CREATININE 13.00*  12.50*  CALCIUM 11.0*   Liver Function Tests:  Recent Labs Lab 06/09/13 1427  AST 18  ALT 14  ALKPHOS 128*  BILITOT 0.3  PROT 9.8*  ALBUMIN 4.7   CBC:  Recent Labs Lab 06/09/13 1427  WBC 8.7  NEUTROABS 7.5  HGB 12.1  13.9  HCT 36.3  41.0  MCV 95.5  PLT 239   CBG:  Recent Labs Lab 06/09/13 1340  GLUCAP 117*    Radiological Exams on Admission: Dg Chest Port 1 View  06/09/2013   *RADIOLOGY REPORT*  Clinical Data: Vomiting  PORTABLE CHEST - 1 VIEW  Comparison: 04/21/2013  Findings: Cardiac size is stable.  Atherosclerotic calcifications of thoracic aorta again noted.  No acute infiltrate or pulmonary edema.  Stable scarring in the right lower lobe laterally.  IMPRESSION: No active disease.  No significant change.   Original Report Authenticated By: Lahoma Crocker, M.D.    EKG: Independently reviewed.   Assessment/Plan Principal Problem:   HYPERKALEMIA Active Problems:   DIABETES MELLITUS, TYPE II   Peripheral vascular disease, unspecified   ESRD (end stage renal disease)   Vomiting Uncertain etiology Gastroenteritis vs gastro-paresis vs obstruction. Unable to keep water or medications down at this point-therefore keep on clears if tolerates otherwise NPO No  imaging done in the ED.   Will get a XRay of the abdomen after HD-as she has severe hyperkalemia, if needed we can get a CT abdomen, if her symptoms were to continue, check lipase as well. May need a gastric emptying study if her symptoms do not abate Will hold oral medications - please add back when she is able to take PO. Clear liquid diet-if she tolerates  Hyperkalemia  2/2 to Missed HD and Dietary indulgences Receiving kayexalate in ED Going to HD post haste. Every 6 hour BMET ordered starting at 10 pm (after HD)  Hypertension Will have to hold oral medications at this point Place on Hydralazine PRN and Clonidine Patch.  ESRD Going to HD now 6/10 Per Renal PA - patient has not been able to Dialyze well recently due to poor access.    She has appointment with DR. CHEN Thursday regarding unclotting the current access in the left thigh. If she does not dialyze well vascular will need be called emergently.  DM Diet controlled.  Hyponatremia Mild and Chronic Appears asymptomatic.  COPD, Thyroid, Gout  - stable and asymptomatic at this point.  DVT prophylaxis Heparin  Code Status: Reviewed with patient - full code. Family Communication: Daughter,  Glori Luis, at bedside Disposition Plan: Inpatient  Time spent: 60 min.  Karen Kitchens [facility] Pager [phone]  If 7PM-7AM, please contact night-coverage [email or web address] Password [facility] 06/09/2013, 4:43 PM   Attending Patient seen and examined, agree with the assessment and plan as outlined above. Presents with 2 day hx of vomting and mild epigastric discomfort, has missed her last HD session, K significantly elevated. Getting HD now, abdomen is soft-but will get a XRAY of the abdomen as a first step, if symptoms continue may need a CT abd or a gastric emptying study. Rest as above  S Zelma Snead

## 2013-06-09 NOTE — ED Notes (Signed)
CBG by Pt request and RN approval. CBG was 117

## 2013-06-10 ENCOUNTER — Other Ambulatory Visit: Payer: Self-pay

## 2013-06-10 ENCOUNTER — Inpatient Hospital Stay (HOSPITAL_COMMUNITY): Payer: Medicare Other

## 2013-06-10 ENCOUNTER — Encounter (HOSPITAL_COMMUNITY): Payer: Self-pay | Admitting: Radiology

## 2013-06-10 DIAGNOSIS — R112 Nausea with vomiting, unspecified: Secondary | ICD-10-CM | POA: Diagnosis present

## 2013-06-10 DIAGNOSIS — R066 Hiccough: Secondary | ICD-10-CM | POA: Diagnosis present

## 2013-06-10 DIAGNOSIS — D649 Anemia, unspecified: Secondary | ICD-10-CM | POA: Diagnosis present

## 2013-06-10 DIAGNOSIS — R404 Transient alteration of awareness: Secondary | ICD-10-CM

## 2013-06-10 DIAGNOSIS — R4189 Other symptoms and signs involving cognitive functions and awareness: Secondary | ICD-10-CM | POA: Diagnosis not present

## 2013-06-10 LAB — BASIC METABOLIC PANEL
CO2: 24 mEq/L (ref 19–32)
Calcium: 10 mg/dL (ref 8.4–10.5)
Calcium: 9.2 mg/dL (ref 8.4–10.5)
Creatinine, Ser: 6.48 mg/dL — ABNORMAL HIGH (ref 0.50–1.10)
Creatinine, Ser: 2.54 mg/dL — ABNORMAL HIGH (ref 0.50–1.10)
GFR calc Af Amer: 20 mL/min — ABNORMAL LOW (ref >90)
GFR calc non Af Amer: 6 mL/min — ABNORMAL LOW (ref >90)
GFR calc non Af Amer: 17 mL/min — ABNORMAL LOW (ref >90)

## 2013-06-10 LAB — BLOOD GAS, ARTERIAL
Acid-Base Excess: 4.9 mmol/L — ABNORMAL HIGH (ref 0.0–2.0)
Bicarbonate: 28.6 mEq/L — ABNORMAL HIGH (ref 20.0–24.0)
Drawn by: 12971
O2 Saturation: 95.2 %
TCO2: 29.9 mmol/L (ref 0–100)
pCO2 arterial: 40.5 mmHg (ref 35.0–45.0)
pO2, Arterial: 69.3 mmHg — ABNORMAL LOW (ref 80.0–100.0)

## 2013-06-10 LAB — CBC
MCH: 32.2 pg (ref 26.0–34.0)
MCH: 32.2 pg (ref 26.0–34.0)
MCHC: 33.4 g/dL (ref 30.0–36.0)
MCV: 96.3 fL (ref 78.0–100.0)
MCV: 96.8 fL (ref 78.0–100.0)
Platelets: 212 10*3/uL (ref 150–400)
Platelets: 188 10*3/uL (ref 150–400)
RBC: 3.26 MIL/uL — ABNORMAL LOW (ref 3.87–5.11)
RDW: 14.3 % (ref 11.5–15.5)
WBC: 10.2 10*3/uL (ref 4.0–10.5)

## 2013-06-10 LAB — GLUCOSE, CAPILLARY: Glucose-Capillary: 153 mg/dL — ABNORMAL HIGH (ref 70–99)

## 2013-06-10 LAB — RENAL FUNCTION PANEL
Albumin: 3.7 g/dL (ref 3.5–5.2)
BUN: 38 mg/dL — ABNORMAL HIGH (ref 6–23)
Phosphorus: 7.1 mg/dL — ABNORMAL HIGH (ref 2.3–4.6)
Potassium: 4.4 mEq/L (ref 3.5–5.1)
Sodium: 137 mEq/L (ref 135–145)

## 2013-06-10 MED ORDER — METOPROLOL SUCCINATE ER 50 MG PO TB24
50.0000 mg | ORAL_TABLET | Freq: Every day | ORAL | Status: DC
  Administered 2013-06-10 – 2013-06-12 (×3): 50 mg via ORAL
  Filled 2013-06-10 (×3): qty 1

## 2013-06-10 MED ORDER — SODIUM CHLORIDE 0.9 % IV SOLN: 100.0000 mL | INTRAVENOUS | Status: DC | PRN

## 2013-06-10 MED ORDER — AMLODIPINE BESYLATE 10 MG PO TABS
10.0000 mg | ORAL_TABLET | Freq: Every day | ORAL | Status: DC
  Administered 2013-06-10 – 2013-06-12 (×2): 10 mg via ORAL
  Filled 2013-06-10 (×3): qty 1

## 2013-06-10 MED ORDER — TIZANIDINE HCL 4 MG PO TABS
4.0000 mg | ORAL_TABLET | Freq: Two times a day (BID) | ORAL | Status: DC | PRN
  Filled 2013-06-10: qty 1

## 2013-06-10 MED ORDER — HEPARIN SODIUM (PORCINE) 1000 UNIT/ML DIALYSIS: 1000.0000 [IU] | INTRAMUSCULAR | Status: DC | PRN

## 2013-06-10 MED ORDER — DEXTROSE 5 % IV SOLN
1.5000 g | INTRAVENOUS | Status: AC
  Administered 2013-06-11: 1.5 g via INTRAVENOUS
  Filled 2013-06-10: qty 1.5

## 2013-06-10 MED ORDER — CHLORPROMAZINE HCL 25 MG PO TABS
25.0000 mg | ORAL_TABLET | Freq: Once | ORAL | Status: AC
  Administered 2013-06-10: 25 mg via ORAL
  Filled 2013-06-10: qty 1

## 2013-06-10 MED ORDER — LIDOCAINE-PRILOCAINE 2.5-2.5 % EX CREA
1.0000 "application " | TOPICAL_CREAM | CUTANEOUS | Status: DC | PRN
  Filled 2013-06-10: qty 5

## 2013-06-10 MED ORDER — ALTEPLASE 2 MG IJ SOLR
2.0000 mg | Freq: Once | INTRAMUSCULAR | Status: DC | PRN
  Filled 2013-06-10: qty 2

## 2013-06-10 MED ORDER — CLONIDINE HCL 0.2 MG PO TABS: 0.2000 mg | ORAL_TABLET | Freq: Two times a day (BID) | ORAL | Status: DC

## 2013-06-10 MED ORDER — TRAMADOL HCL 50 MG PO TABS
50.0000 mg | ORAL_TABLET | Freq: Four times a day (QID) | ORAL | Status: DC | PRN
  Administered 2013-06-12: 50 mg via ORAL
  Filled 2013-06-10 (×2): qty 1

## 2013-06-10 MED ORDER — PANTOPRAZOLE SODIUM 40 MG PO TBEC
40.0000 mg | DELAYED_RELEASE_TABLET | Freq: Every day | ORAL | Status: DC
  Administered 2013-06-10 – 2013-06-12 (×3): 40 mg via ORAL
  Filled 2013-06-10 (×2): qty 1

## 2013-06-10 MED ORDER — PENTAFLUOROPROP-TETRAFLUOROETH EX AERO: 1.0000 "application " | INHALATION_SPRAY | CUTANEOUS | Status: DC | PRN

## 2013-06-10 MED ORDER — NEPRO/CARBSTEADY PO LIQD
237.0000 mL | ORAL | Status: DC | PRN
  Filled 2013-06-10: qty 237

## 2013-06-10 MED ORDER — LOSARTAN POTASSIUM 50 MG PO TABS
100.0000 mg | ORAL_TABLET | Freq: Every evening | ORAL | Status: DC
  Administered 2013-06-11: 100 mg via ORAL
  Filled 2013-06-10 (×3): qty 2

## 2013-06-10 MED ORDER — LIDOCAINE HCL (PF) 1 % IJ SOLN: 5.0000 mL | INTRAMUSCULAR | Status: DC | PRN

## 2013-06-10 MED ORDER — HEPARIN SODIUM (PORCINE) 1000 UNIT/ML DIALYSIS
7000.0000 [IU] | Freq: Once | INTRAMUSCULAR | Status: DC
  Filled 2013-06-10: qty 7

## 2013-06-10 NOTE — Significant Event (Signed)
Rapid Response Event Note  Overview:  Called to assist with patient with sudden onset decreased LOC.   Time Called: [place] Time: [id number] Event Type: Neurologic  Initial Focused Assessment: Met patient and RN in hallway on way to CT scan - patient warm and mosit - snoring resps - reg and unlabored - mae x 4 to sternal rub - no eye opening does not follow commands - HR ST 123 - RR 16 - RN reports BP 170/82 CBG 158.     Interventions: To CT scan - tol - back to room - Dr. Conley Canal present - patient very purposeful with attempt to oral suction - pushed hand away - and grimaced.  O2 sat 97%.  Call for ABG.  PCXR done. BP remains stable.  Rectal temp 100.3.  12 lead EKG done.  Handoff to April, RN with RRT.   Event Summary:  Handoff to April RN - will finish charting with test results.     at      at          [place], Nena Jordan

## 2013-06-10 NOTE — Progress Notes (Addendum)
Late entry: Patient evaluated at 6:30 PM  Called by RN about sudden somnolence. Vital signs CBG normal. Patient is somnolent. She has purposeful movement to noxious stimuli. Received Thorazine for hiccups at 5 PM. CT brain shows no bleed or acute infarct. Chest x-ray, ABG, EKG and labs pending. Discussed with on call night hospitalist team. Suspect medication effect. Will monitor closely and place patient on telemetry.  Critical care time 30 minutes.  Doree Barthel, M.D.

## 2013-06-10 NOTE — Progress Notes (Signed)
Nutrition Brief Note  Patient identified on the Malnutrition Screening Tool (MST) Report. Pt reports weight loss PTA, however chart review reveals weights have been relatively stable. Pt reports to this RD that her appetite is adequate and denies any questions/concerns at this time.  Wt Readings from Last 5 Encounters:  06/10/13 124 lb 12.5 oz (56.6 kg)  05/29/13 118 lb (53.524 kg)  03/13/13 118 lb (53.524 kg)  03/09/13 118 lb 2.7 oz (53.601 kg)  02/20/13 124 lb (56.246 kg)    Body mass index is 26.09 kg/(m^2). Patient meets criteria for overweight based on current BMI.   Current diet order is Heart Healthy. Recommend changing to Renal 80-90 diet to encourage dietary compliance. Labs and medications reviewed.   No nutrition interventions warranted at this time. If nutrition issues arise, please consult RD.   Inda Coke MS, RD, LDN Pager: [phone] After-hours pager: [phone]

## 2013-06-10 NOTE — Procedures (Signed)
[facility]  On HD via R fem TDC BP--138/59 Goal--2.5 L Hgb--10.1   K+--4.4 On no heparin today (since she may get new HD cath)

## 2013-06-10 NOTE — Consult Note (Addendum)
[facility]  Patient was recently seen in the clinic, see my note dated, 05/29/13.  She is scheduled for her right tunneled dialysis catheter exchange with fibrin sheath venoplasty tomorrow at 0730.  Laboratory CBC    Component Value Date/Time   WBC 10.2 06/10/2013 0430   WBC 6.2 11/06/2006 0920   HGB 10.1* 06/10/2013 0430   HGB 12.2 11/06/2006 0920   HCT 30.4* 06/10/2013 0430   HCT 37.7 11/06/2006 0920   PLT 188 06/10/2013 0430   PLT 375 11/06/2006 0920    BMET    Component Value Date/Time   NA 136 06/10/2013 0430   NA 137 06/10/2013 0430   K 4.5 06/10/2013 0430   K 4.4 06/10/2013 0430   CL 93* 06/10/2013 0430   CL 93* 06/10/2013 0430   CO2 24 06/10/2013 0430   CO2 22 06/10/2013 0430   GLUCOSE 107* 06/10/2013 0430   GLUCOSE 107* 06/10/2013 0430   BUN 39* 06/10/2013 0430   BUN 38* 06/10/2013 0430   CREATININE 6.48* 06/10/2013 0430   CREATININE 6.73* 06/10/2013 0430   CALCIUM 10.0 06/10/2013 0430   CALCIUM 9.9 06/10/2013 0430   GFRNONAA 6* 06/10/2013 0430   GFRNONAA 5* 06/10/2013 0430   GFRAA 6* 06/10/2013 0430   GFRAA 6* 06/10/2013 0430    [name], MD [facility] Office: [phone] Pager: [phone]  06/10/2013, 4:20 PM

## 2013-06-10 NOTE — Progress Notes (Signed)
[facility] PROGRESS NOTE  [name] [id number] DOB: 22-Jul-1934 DOA: 06/09/2013 PCP: Vena Austria, MD  Chart reviewed  Assessment/Plan:    HYPERKALEMIA: Resolved. Discontinue telemetry.    Nausea and vomiting: Resolved. Diet has been advanced. X-ray negative. Could have been related to uremia. BUN on admission was 100.    HYPERTENSION: Resume home medications    ESRD (end stage renal disease)  Poorly functioning dialysis catheter: Vascular consult is pending    Peripheral vascular disease, unspecified    Anemia    Hiccups: Will give a one-time dose of Thorazine.  Code Status: full Family Communication: none today Disposition Plan: home  Consultants:  Nephrology  [facility]  Procedures:    Antibiotics:    HPI/Subjective: No N/V today.  Has eaten some fruit today. Complains of hiccups for several days. No abdominal pain.  Objective: Filed Vitals:   06/10/13 1100 06/10/13 1131 06/10/13 1201 06/10/13 1230  BP: 169/53 158/66 149/98 166/63  Pulse: 94 108 101 111  Temp:      TempSrc:      Resp: 21 19 13 17   Height:      Weight:      SpO2:        Intake/Output Summary (Last 24 hours) at 06/10/13 1313 Last data filed at 06/10/13 0824  Gross per 24 hour  Intake 555.83 ml  Output    357 ml  Net 198.83 ml   Filed Weights   06/09/13 1640 06/09/13 2030 06/10/13 0825  Weight: 55.9 kg (123 lb 3.8 oz) 55 kg (121 lb 4.1 oz) 56.6 kg (124 lb 12.5 oz)    Exam: Seen in dialysis.   General:  Comfortable, alert, oriented.  Hiccups  Cardiovascular: Regular rate rhythm without murmurs gallops rubs  Respiratory: Clear auscultation bilaterally without wheeze rhonchi or rale  Abdomen: Normal bowel sounds. Soft nontender nondistended  EXT:  No clubbing cyanosis or edema  Data Reviewed: Basic Metabolic Panel:  Recent Labs Lab 06/09/13 1427 06/09/13 2235 06/10/13 0430  NA 133*  131* 137 136  137  K 7.5*  7.4* 3.8 4.5  4.4  CL 83*   95* 94* 93*  93*  CO2 24 21 24  22   GLUCOSE 127*  129* 129* 107*  107*  BUN 100*  101* 33* 39*  38*  CREATININE 13.00*  12.50* 5.67* 6.48*  6.73*  CALCIUM 11.0* 10.2 10.0  9.9  PHOS  --   --  7.1*   Liver Function Tests:  Recent Labs Lab 06/09/13 1427 06/10/13 0430  AST 18  --   ALT 14  --   ALKPHOS 128*  --   BILITOT 0.3  --   PROT 9.8*  --   ALBUMIN 4.7 3.7    Recent Labs Lab 06/09/13 2235  LIPASE 17   No results found for this basename: AMMONIA,  in the last 168 hours CBC:  Recent Labs Lab 06/09/13 1427 06/09/13 2235 06/10/13 0430  WBC 8.7 11.4* 10.2  NEUTROABS 7.5 10.2*  --   HGB 12.1  13.9 10.5* 10.1*  HCT 36.3  41.0 31.4* 30.4*  MCV 95.5 96.3 96.8  PLT 239 212 188   Cardiac Enzymes:  Recent Labs Lab 06/09/13 2223  TROPONINI <0.30   BNP (last 3 results) No results found for this basename: PROBNP,  in the last 8760 hours CBG:  Recent Labs Lab 06/09/13 1340  GLUCAP 117*    No results found for this or any previous visit (from the past 240 hour(s)).  Studies: Dg Chest Port 1 View  06/09/2013   *RADIOLOGY REPORT*  Clinical Data: Vomiting  PORTABLE CHEST - 1 VIEW  Comparison: 04/21/2013  Findings: Cardiac size is stable.  Atherosclerotic calcifications of thoracic aorta again noted.  No acute infiltrate or pulmonary edema.  Stable scarring in the right lower lobe laterally.  IMPRESSION: No active disease.  No significant change.   Original Report Authenticated By: Lahoma Crocker, M.D.   Dg Abd 2 Views  06/10/2013   *RADIOLOGY REPORT*  Clinical Data: Persistent nausea and vomiting.  ABDOMEN - 2 VIEW  Comparison: CT abdomen 03/08/2013  Findings: There is gas and stool throughout the colon.  No small or large bowel distension.  No free intra-abdominal air.  The no abnormal air fluid levels.  No radiopaque stones.  Degenerative changes and post kyphoplasty changes in the spine.  Vascular stents.  Right femoral venous catheter with tip over the  right atrium.  Degenerative changes in the hips.  IMPRESSION:  Nonobstructive bowel gas pattern.   Original Report Authenticated By: Lucienne Capers, M.D.    Scheduled Meds: . cloNIDine  0.2 mg Transdermal Weekly  . heparin  5,000 Units Subcutaneous Q8H  . heparin  7,000 Units Dialysis Once in dialysis  .  morphine injection  4 mg Intramuscular Once  . multivitamin  1 tablet Oral Daily  . pantoprazole (PROTONIX) IV  40 mg Intravenous Q12H  . sevelamer carbonate  2,400 mg Oral TID WC  . sodium chloride  3 mL Intravenous Q12H   Continuous Infusions:   Time spent: 35 minutes  [facility] Pager [phone]. If 7PM-7AM, please contact night-coverage at [email or web address], password [facility] 06/10/2013, 1:13 PM  LOS: 1 day

## 2013-06-10 NOTE — Progress Notes (Signed)
Subjective:  Feeling better, no current nausea, but vomited once earlier this morning.  On zofran and protonix.  She said she had BM and she felt better after.    Objective: Vital signs in last 24 hours: Temp:  [96.9 F (36.1 C)-99.1 F (37.3 C)] 99.1 F (37.3 C) (06/11 0541) Pulse Rate:  [84-115] 113 (06/11 0541) Resp:  [16-20] 17 (06/11 0541) BP: (83-211)/(38-105) 166/58 mmHg (06/11 0541) SpO2:  [97 %-100 %] 97 % (06/11 0541) Weight:  [55 kg (121 lb 4.1 oz)-55.9 kg (123 lb 3.8 oz)] 55 kg (121 lb 4.1 oz) (06/10 2030) Weight change:   Intake/Output from previous day: 06/10 0701 - 06/11 0700 In: 155.8 [P.O.:120; I.V.:35.8] Out: 356  Intake/Output this shift: Total I/O In: 155.8 [P.O.:120; I.V.:35.8] Out: 356 [Other:356] EXAM: General appearance:  Alert, in no apparent distress Resp:  CTA without rales, rhonchi, or wheezes Cardio:  RRR without murmur or rub GI: + BS, soft and nontender Extremities:  No edema, old 5th toe amputation  Access:  Right femoral tunneled dialysis catheter  Lab Results:  Recent Labs  06/09/13 2235 06/10/13 0430  WBC 11.4* 10.2  HGB 10.5* 10.1*  HCT 31.4* 30.4*  PLT 212 188   BMET:  Recent Labs  06/09/13 1427 06/09/13 2235 06/10/13 0430  NA 133*  131* 137 136  K 7.5*  7.4* 3.8 4.5  CL 83*  95* 94* 93*  CO2 24 21 24   GLUCOSE 127*  129* 129* 107*  BUN 100*  101* 33* 39*  CREATININE 13.00*  12.50* 5.67* 6.48*  CALCIUM 11.0* 10.2 10.0  ALBUMIN 4.7  --   --    No results found for this basename: PTH,  in the last 72 hours Iron Studies: No results found for this basename: IRON, TIBC, TRANSFERRIN, FERRITIN,  in the last 72 hours  Dialysis Orders: Center: [place] on MWF.  EDW 54 kg HD Bath 2K/2.25Ca Time 3 hrs 45 mins Heparin 7000-U bolus, then 3000 U mid-Tx. Access Right femoral catheter BFR 400 DFR 800 Hectorol 3 mcg IV/HD Epogen 1000 Units IV/HD Venofer 0 Profile 4.  Assessment/Plan: 1. Nausea & vomiting - improving, since 6/8,  last HD 6/6, recently with poor treatments secondary to poorly functioning catheter; afebrile, WBCs up slightly (10.2 today), chest x-ray negative.  On zofran and protonix--I'd suggest H2 blocker (less expensive) if she's d/c'd with acid reducing med 2. Hyperkalemia - K 7.5 yesterday, secondary to missed HD; 3.8 post-HD yesterday, 4.5 this morning. 3. Poorly functioning dialysis catheter - seen by Dr. Bridgett Larsson of [facility] 5/30, scheduled R iliac & central venography (to determine where to put next permanent access), likely fibrin sheath angioplasty, and catheter exchange on 6/12. [facility] contracted and may do procedure today.  4. ESRD - HD on MWF @ [place], s/p HD last night (BFR < 300) after missed HD 6/9. HD today to maintain schedule.  5. Hypertension/volume - BP 166/58; chest x-ray negative, @ EDW of 55.  6. Anemia - Hgb 10.1, on outpatient Epogen [id number] U.  7. Metabolic bone disease - Ca 10, P 5.2, iPTH 744 (4/23); Hectorol 3 mcg, Phoslo 3 with meals.  Hectorol on hold, switched to Renvela.  8. Nutrition - Alb 4.7, renal diet, vitamin. DM - per primary.     LOS: 1 day   [name],CHARLES 06/10/2013,6:44 AM I spoke with  and examined Mrs. Mallik as well as reviewed records and results.  She feels better today.  K is in normal range.  She'll get HD  again today to stay on schedule.  [facility] to see about cath exchange and to plan future access.

## 2013-06-10 NOTE — Progress Notes (Signed)
Entered patient's room to give evening medications and patient was found to be responsive only to sternal rub. Obtained VS's; BP 137/119, Pulse 128, respirations 18/min., SpO2 on room air = 100%. Pupils pinpoint and non-reactive. CBG = 153. Patient displays some gargling with inspiration. Oral suctioning performed, with minimal clear sputum suctioned. Charge nurse, rapid response nurse and Dr. Conley Canal called to pt's bedside. Pt taken for stat CT of brain.  19:20: Patient returned to unit 6700 from radiology. Patient shows slightly increased level of purposeful movement but remains obtunded. M.D. paged to patient's bedside. Portable CXR and EKG obtained.  Patient placed on telemetry. Night shift nurse updated on patient's status and will continue to monitor.   19:30: Called patient's daughter, [name], to inform her of patient's mental status changes.  Daughter requested that night nurse update her throughout the evening. Relayed this message.

## 2013-06-11 ENCOUNTER — Inpatient Hospital Stay (HOSPITAL_COMMUNITY): Payer: Medicare Other | Admitting: Critical Care Medicine

## 2013-06-11 ENCOUNTER — Encounter (HOSPITAL_COMMUNITY): Payer: Self-pay | Admitting: Critical Care Medicine

## 2013-06-11 ENCOUNTER — Encounter (HOSPITAL_COMMUNITY): Payer: Self-pay

## 2013-06-11 ENCOUNTER — Encounter (HOSPITAL_COMMUNITY)
Admission: EM | Disposition: A | Discharge status: Home / Self Care | Payer: Self-pay | Source: Home / Self Care | Attending: Internal Medicine

## 2013-06-11 ENCOUNTER — Ambulatory Visit (HOSPITAL_COMMUNITY): Admit: 2013-06-11 | Payer: Self-pay | Admitting: Vascular Surgery

## 2013-06-11 ENCOUNTER — Ambulatory Visit (HOSPITAL_COMMUNITY): Admission: RE | Admit: 2013-06-11 | Payer: Medicare Other | Source: Ambulatory Visit | Admitting: Vascular Surgery

## 2013-06-11 ENCOUNTER — Inpatient Hospital Stay (HOSPITAL_COMMUNITY): Payer: Medicare Other

## 2013-06-11 DIAGNOSIS — T82898A Other specified complication of vascular prosthetic devices, implants and grafts, initial encounter: Secondary | ICD-10-CM

## 2013-06-11 DIAGNOSIS — D649 Anemia, unspecified: Secondary | ICD-10-CM

## 2013-06-11 DIAGNOSIS — N186 End stage renal disease: Secondary | ICD-10-CM

## 2013-06-11 HISTORY — PX: FISTULOGRAM: SHX5832

## 2013-06-11 HISTORY — PX: EXCHANGE OF A DIALYSIS CATHETER: SHX5818

## 2013-06-11 LAB — BASIC METABOLIC PANEL
BUN: 19 mg/dL (ref 6–23)
BUN: 21 mg/dL (ref 6–23)
CO2: 26 mEq/L (ref 19–32)
CO2: 25 mEq/L (ref 19–32)
CO2: 27 mEq/L (ref 19–32)
Calcium: 10.3 mg/dL (ref 8.4–10.5)
Calcium: 10.2 mg/dL (ref 8.4–10.5)
Calcium: 9.9 mg/dL (ref 8.4–10.5)
Chloride: 97 mEq/L (ref 96–112)
Chloride: 96 mEq/L (ref 96–112)
Chloride: 96 mEq/L (ref 96–112)
Creatinine, Ser: 4.15 mg/dL — ABNORMAL HIGH (ref 0.50–1.10)
Creatinine, Ser: 4.77 mg/dL — ABNORMAL HIGH (ref 0.50–1.10)
Creatinine, Ser: 5.23 mg/dL — ABNORMAL HIGH (ref 0.50–1.10)
Creatinine, Ser: 5.83 mg/dL — ABNORMAL HIGH (ref 0.50–1.10)
GFR calc Af Amer: 9 mL/min — ABNORMAL LOW (ref >90)
GFR calc Af Amer: 8 mL/min — ABNORMAL LOW (ref >90)
GFR calc non Af Amer: 12 mL/min — ABNORMAL LOW (ref >90)
GFR calc non Af Amer: 9 mL/min — ABNORMAL LOW (ref >90)
GFR calc non Af Amer: 7 mL/min — ABNORMAL LOW (ref >90)
Glucose, Bld: 133 mg/dL — ABNORMAL HIGH (ref 70–99)
Glucose, Bld: 194 mg/dL — ABNORMAL HIGH (ref 70–99)
Potassium: 3.2 mEq/L — ABNORMAL LOW (ref 3.5–5.1)
Sodium: 134 mEq/L — ABNORMAL LOW (ref 135–145)
Sodium: 137 mEq/L (ref 135–145)

## 2013-06-11 LAB — TROPONIN I
Troponin I: <0.3 ng/mL (ref <0.30)
Troponin I: <0.3 ng/mL (ref <0.30)

## 2013-06-11 LAB — GLUCOSE, CAPILLARY

## 2013-06-11 SURGERY — VENOGRAM
Anesthesia: LOCAL

## 2013-06-11 SURGERY — EXCHANGE OF A DIALYSIS CATHETER
Anesthesia: Monitor Anesthesia Care | Site: Groin | Laterality: Right

## 2013-06-11 MED ORDER — LIDOCAINE HCL (CARDIAC) 20 MG/ML IV SOLN
INTRAVENOUS | Status: DC | PRN
  Administered 2013-06-11: 20 mg via INTRAVENOUS

## 2013-06-11 MED ORDER — ONDANSETRON HCL 4 MG/2ML IJ SOLN
INTRAMUSCULAR | Status: DC | PRN
  Administered 2013-06-11: 4 mg via INTRAVENOUS

## 2013-06-11 MED ORDER — SODIUM CHLORIDE 0.9 % IR SOLN
Status: DC | PRN
  Administered 2013-06-11: 07:00:00

## 2013-06-11 MED ORDER — BUPIVACAINE HCL (PF) 0.5 % IJ SOLN
INTRAMUSCULAR | Status: DC | PRN
  Administered 2013-06-11: 30 mL

## 2013-06-11 MED ORDER — IOHEXOL 300 MG/ML  SOLN
INTRAMUSCULAR | Status: DC | PRN
  Administered 2013-06-11: 100 mL via INTRAVENOUS

## 2013-06-11 MED ORDER — FENTANYL CITRATE 0.05 MG/ML IJ SOLN
INTRAMUSCULAR | Status: DC | PRN
  Administered 2013-06-11: 25 ug via INTRAVENOUS
  Administered 2013-06-11 (×2): 50 ug via INTRAVENOUS

## 2013-06-11 MED ORDER — DEXTROSE 5 % IV SOLN
INTRAVENOUS | Status: AC
  Filled 2013-06-11: qty 50

## 2013-06-11 MED ORDER — SODIUM CHLORIDE 0.9 % IV SOLN
INTRAVENOUS | Status: DC | PRN
  Administered 2013-06-11: 07:00:00 via INTRAVENOUS

## 2013-06-11 MED ORDER — DARBEPOETIN ALFA-POLYSORBATE 25 MCG/0.42ML IJ SOLN
6.2500 ug | INTRAMUSCULAR | Status: DC
  Filled 2013-06-11: qty 0.42

## 2013-06-11 MED ORDER — MIDAZOLAM HCL 5 MG/5ML IJ SOLN
INTRAMUSCULAR | Status: DC | PRN
  Administered 2013-06-11: 2 mg via INTRAVENOUS

## 2013-06-11 MED ORDER — LIDOCAINE-EPINEPHRINE (PF) 1 %-1:200000 IJ SOLN
INTRAMUSCULAR | Status: DC | PRN
  Administered 2013-06-11: 30 mL

## 2013-06-11 MED ORDER — 0.9 % SODIUM CHLORIDE (POUR BTL) OPTIME
TOPICAL | Status: DC | PRN
  Administered 2013-06-11: 1000 mL

## 2013-06-11 MED ORDER — HEPARIN SODIUM (PORCINE) 1000 UNIT/ML IJ SOLN
INTRAMUSCULAR | Status: DC | PRN
  Administered 2013-06-11: 10 mL

## 2013-06-11 MED ORDER — HEPARIN SODIUM (PORCINE) 1000 UNIT/ML IJ SOLN
INTRAMUSCULAR | Status: AC
  Filled 2013-06-11: qty 1

## 2013-06-11 MED ORDER — PROPOFOL 10 MG/ML IV BOLUS
INTRAVENOUS | Status: DC | PRN
  Administered 2013-06-11: 20 mg via INTRAVENOUS

## 2013-06-11 MED ORDER — DOXERCALCIFEROL 4 MCG/2ML IV SOLN
2.0000 ug | INTRAVENOUS | Status: DC
  Filled 2013-06-11: qty 2

## 2013-06-11 MED ORDER — CEFUROXIME SODIUM 1.5 G IJ SOLR
INTRAMUSCULAR | Status: AC
  Filled 2013-06-11: qty 1.5

## 2013-06-11 MED ORDER — FENTANYL CITRATE 0.05 MG/ML IJ SOLN: 25.0000 ug | INTRAMUSCULAR | Status: DC | PRN

## 2013-06-11 MED ORDER — BUPIVACAINE HCL (PF) 0.5 % IJ SOLN
INTRAMUSCULAR | Status: AC
  Filled 2013-06-11: qty 30

## 2013-06-11 MED ORDER — LIDOCAINE-EPINEPHRINE (PF) 1 %-1:200000 IJ SOLN
INTRAMUSCULAR | Status: AC
  Filled 2013-06-11: qty 10

## 2013-06-11 MED ORDER — PROPOFOL INFUSION 10 MG/ML OPTIME
INTRAVENOUS | Status: DC | PRN
  Administered 2013-06-11: 75 ug/kg/min via INTRAVENOUS

## 2013-06-11 SURGICAL SUPPLY — 82 items
ADH SKN CLS APL DERMABOND .7 (GAUZE/BANDAGES/DRESSINGS) ×2
BAG BANDED W/RUBBER/TAPE 36X54 (MISCELLANEOUS) ×1 IMPLANT
BAG DECANTER FOR FLEXI CONT (MISCELLANEOUS) ×3 IMPLANT
BAG EQP BAND 135X91 W/RBR TAPE (MISCELLANEOUS) ×2
BAG SNAP BAND KOVER 36X36 (MISCELLANEOUS) ×1 IMPLANT
BALLN MUSTANG 8X80X75 (BALLOONS) ×3
BALLN POWERFLEX 8 (BALLOONS)
BALLN POWERPLUS 9 (MISCELLANEOUS) IMPLANT
BALLOON MUSTANG 8X80X75 (BALLOONS) IMPLANT
BALLOON POWERFLEX 8 (BALLOONS) IMPLANT
CANISTER SUCTION 2500CC (MISCELLANEOUS) ×3 IMPLANT
CATH CANNON HEMO 15F 50CM (CATHETERS) ×1 IMPLANT
CATH CANNON HEMO 15FR 19 (HEMODIALYSIS SUPPLIES) IMPLANT
CATH CANNON HEMO 15FR 23CM (HEMODIALYSIS SUPPLIES) IMPLANT
CATH CANNON HEMO 15FR 31CM (HEMODIALYSIS SUPPLIES) IMPLANT
CATH CANNON HEMO 15FR 32 (HEMODIALYSIS SUPPLIES) IMPLANT
CATH CANNON HEMO 15FR 32CM (HEMODIALYSIS SUPPLIES) IMPLANT
CATH SOFT-VU 4F 65 STRAIGHT (CATHETERS) IMPLANT
CATH SOFT-VU STRAIGHT 4F 65CM (CATHETERS) ×3
CLOTH BEACON ORANGE TIMEOUT ST (SAFETY) ×3 IMPLANT
COVER DOME SNAP 22 D (MISCELLANEOUS) ×1 IMPLANT
COVER PROBE W GEL 5X96 (DRAPES) ×3 IMPLANT
COVER SURGICAL LIGHT HANDLE (MISCELLANEOUS) ×3 IMPLANT
DERMABOND ADVANCED (GAUZE/BANDAGES/DRESSINGS) ×1
DERMABOND ADVANCED .7 DNX12 (GAUZE/BANDAGES/DRESSINGS) ×2 IMPLANT
DRAPE C-ARM 42X72 X-RAY (DRAPES) ×2 IMPLANT
DRAPE CHEST BREAST 15X10 FENES (DRAPES) ×3 IMPLANT
ELECT REM PT RETURN 9FT ADLT (ELECTROSURGICAL) ×3
ELECTRODE REM PT RTRN 9FT ADLT (ELECTROSURGICAL) ×2 IMPLANT
GAUZE SPONGE 2X2 8PLY STRL LF (GAUZE/BANDAGES/DRESSINGS) ×2 IMPLANT
GAUZE SPONGE 4X4 16PLY XRAY LF (GAUZE/BANDAGES/DRESSINGS) ×2 IMPLANT
GEL ULTRASOUND 20GR AQUASONIC (MISCELLANEOUS) ×3 IMPLANT
GLOVE BIO SURGEON STRL SZ7 (GLOVE) ×3 IMPLANT
GLOVE BIOGEL PI IND STRL 6.5 (GLOVE) IMPLANT
GLOVE BIOGEL PI IND STRL 7.5 (GLOVE) ×2 IMPLANT
GLOVE BIOGEL PI INDICATOR 6.5 (GLOVE) ×1
GLOVE BIOGEL PI INDICATOR 7.5 (GLOVE) ×1
GLOVE SURG SS PI 7.0 STRL IVOR (GLOVE) ×2 IMPLANT
GOWN STRL NON-REIN LRG LVL3 (GOWN DISPOSABLE) ×6 IMPLANT
GOWN STRL REIN XL XLG (GOWN DISPOSABLE) ×1 IMPLANT
INTRODUCER COOK 11FR (CATHETERS) IMPLANT
INTRODUCER SET COOK 14FR (MISCELLANEOUS) IMPLANT
KIT BASIN OR (CUSTOM PROCEDURE TRAY) ×3 IMPLANT
KIT ENCORE 26 ADVANTAGE (KITS) ×4 IMPLANT
KIT ROOM TURNOVER OR (KITS) ×3 IMPLANT
NDL 18GX1X1/2 (RX/OR ONLY) (NEEDLE) ×2 IMPLANT
NDL HYPO 25GX1X1/2 BEV (NEEDLE) ×2 IMPLANT
NDL PERC 18GX7CM (NEEDLE) ×2 IMPLANT
NEEDLE 18GX1X1/2 (RX/OR ONLY) (NEEDLE) ×3 IMPLANT
NEEDLE HYPO 25GX1X1/2 BEV (NEEDLE) IMPLANT
NEEDLE PERC 18GX7CM (NEEDLE) ×3 IMPLANT
NS IRRIG 1000ML POUR BTL (IV SOLUTION) ×3 IMPLANT
PACK CV ACCESS (CUSTOM PROCEDURE TRAY) ×3 IMPLANT
PACK SURGICAL SETUP 50X90 (CUSTOM PROCEDURE TRAY) ×3 IMPLANT
PAD ARMBOARD 7.5X6 YLW CONV (MISCELLANEOUS) ×6 IMPLANT
SET INTRODUCER 12FR PACEMAKER (SHEATH) IMPLANT
SHEATH FAST CATH 10F 12CM (SHEATH) ×1 IMPLANT
SOAP 2 % CHG 4 OZ (WOUND CARE) ×2 IMPLANT
SPONGE GAUZE 2X2 STER 10/PKG (GAUZE/BANDAGES/DRESSINGS)
SPONGE GAUZE 4X4 12PLY (GAUZE/BANDAGES/DRESSINGS) ×2 IMPLANT
STOPCOCK MORSE 400PSI 3WAY (MISCELLANEOUS) ×1 IMPLANT
SUT ETHILON 3 0 PS 1 (SUTURE) ×3 IMPLANT
SUT MNCRL AB 4-0 PS2 18 (SUTURE) ×3 IMPLANT
SUT PROLENE 6 0 BV (SUTURE) ×3 IMPLANT
SUT SILK 2 0 FS (SUTURE) IMPLANT
SUT VIC AB 3-0 SH 27 (SUTURE) ×3
SUT VIC AB 3-0 SH 27X BRD (SUTURE) ×2 IMPLANT
SYR 20CC LL (SYRINGE) ×6 IMPLANT
SYR 30ML LL (SYRINGE) IMPLANT
SYR 3ML LL SCALE MARK (SYRINGE) ×3 IMPLANT
SYR 5ML LL (SYRINGE) ×3 IMPLANT
SYR CONTROL 10ML LL (SYRINGE) ×2 IMPLANT
SYR MEDRAD MARK V 150ML (SYRINGE) ×1 IMPLANT
SYRINGE 10CC LL (SYRINGE) ×3 IMPLANT
TAPE CLOTH SURG 4X10 WHT LF (GAUZE/BANDAGES/DRESSINGS) ×1 IMPLANT
TOWEL OR 17X24 6PK STRL BLUE (TOWEL DISPOSABLE) ×3 IMPLANT
TOWEL OR 17X26 10 PK STRL BLUE (TOWEL DISPOSABLE) ×3 IMPLANT
TUBING HIGH PRESSURE 120CM (CONNECTOR) ×1 IMPLANT
UNDERPAD 30X30 INCONTINENT (UNDERPADS AND DIAPERS) ×3 IMPLANT
WATER STERILE IRR 1000ML POUR (IV SOLUTION) ×3 IMPLANT
WIRE AMPLATZ SS-J .035X180CM (WIRE) ×1 IMPLANT
WIRE BENTSON .035X145CM (WIRE) ×3 IMPLANT

## 2013-06-11 NOTE — Interval H&P Note (Signed)
[facility]  History and Physical Update  The patient was interviewed and re-examined.  The patient's previous History and Physical has been reviewed and is unchanged from my consult on: 05/29/13 except for: interval hospitalization.  There is no change in the plan of care: [facility] exchange, venoplasty of fibrin sheath.  [name], MD [facility] Office: [phone] Pager: [phone]  06/11/2013, 7:33 AM

## 2013-06-11 NOTE — Anesthesia Preprocedure Evaluation (Addendum)
Anesthesia Evaluation  Patient identified by MRN, date of birth, ID band Patient awake    Reviewed: Allergy & Precautions, H&P , NPO status , Patient's Chart, lab work & pertinent test results  Airway Mallampati: II TM Distance: >3 FB Neck ROM: Full    Dental no notable dental hx. (+) Dental Advisory Given, Loose and Teeth Intact   Pulmonary shortness of breath and with exertion, asthma , COPD breath sounds clear to auscultation  Pulmonary exam normal       Cardiovascular hypertension, Pt. on medications and Pt. on home beta blockers + angina + Peripheral Vascular Disease + Valvular Problems/Murmurs Rhythm:Regular Rate:Normal     Neuro/Psych negative neurological ROS  negative psych ROS   GI/Hepatic negative GI ROS, Neg liver ROS,   Endo/Other  diabetes  Renal/GU ESRF and DialysisRenal disease  negative genitourinary   Musculoskeletal   Abdominal   Peds  Hematology negative hematology ROS (+)   Anesthesia Other Findings   Reproductive/Obstetrics negative OB ROS                         Anesthesia Physical Anesthesia Plan  ASA: III  Anesthesia Plan: MAC   Post-op Pain Management:    Induction: Intravenous  Airway Management Planned: Nasal Cannula  Additional Equipment:   Intra-op Plan:   Post-operative Plan:   Informed Consent: I have reviewed the patients History and Physical, chart, labs and discussed the procedure including the risks, benefits and alternatives for the proposed anesthesia with the patient or authorized representative who has indicated his/her understanding and acceptance.   Dental advisory given  Plan Discussed with: Anesthesiologist, Surgeon and CRNA  Anesthesia Plan Comments:        Anesthesia Quick Evaluation

## 2013-06-11 NOTE — Progress Notes (Signed)
I have seen and examined this patient and agree with plan as outlined by Tharon Aquas, PA-C and appreciate Dr. Lianne Moris assistance. Mendell Bontempo A,MD 06/11/2013 3:01 PM

## 2013-06-11 NOTE — Progress Notes (Signed)
[facility] Progress Note  Subjective:   No c/o  Objective Filed Vitals:   06/11/13 1133 06/11/13 1200 06/11/13 1227 06/11/13 1326  BP: 163/54  144/63   Pulse: 98  108 104  Temp: 97.5 F (36.4 C) 97.4 F (36.3 C) 98.1 F (36.7 C)   TempSrc:   Oral   Resp: 11  12   Height:      Weight:      SpO2: 100%  99%    Physical Exam General: alert, talkative, joking Heart: RRR Lungs: no rales Abdomen: soft NT Extremities: no LE edema; no open wounds Dialysis Access: right fem cath  Dialysis Orders: Center: [place] on MWF.  EDW 54 kg HD Bath 2K/2.25Ca Time 3 hrs 45 mins Heparin 7000-U bolus, then 3000 U mid-Tx. Access Right femoral catheter BFR 400 DFR 800 Hectorol 3 mcg IV/HD Epogen 1000 Units IV/HD Venofer 0 Profile 4.  Assessment/Plan: 1. Nausea and vomiting resolved - likely related to uremia BUN 100, Cr 13 on admission; down to a post HD BUn of 8 and Cr 2.54 yesterday. Uremia related to missed HD coupled with a poorly functional femoral catheter 2. ESRD - with hyperkalemia - corrected with HD; catheter replaced today with disruption of fibrin sheath by Dr. Bridgett Larsson 3. Anemia - Hgb drifting down to 10.1 - recheck in AM; give Aranesp 6.25 x 1 4. Secondary hyperparathyroidism -  Ca down to 9.9 off hectorol - would change to 2 Ca bath at discharge; have ordered 2 Ca bath for tomorrow - and resume hectorol at a lower dose of 2; phoslo has been changed to renvela 5. HTN/volume - BP somewhat ^; weights higher yesterday than Wed - needs standing weights pre and post HD 6. Nutrition - renal diet  Myriam Jacobson, PA-C [facility] [phone] 06/11/2013,2:34 PM  LOS: 2 days    Additional Objective Labs: Basic Metabolic Panel:  Recent Labs Lab 06/10/13 0430  06/10/13 2254 06/11/13 0440 06/11/13 1040  NA 136  137  < > 134* 135 137  K 4.5  4.4  < > 3.3* 3.3* 4.1  CL 93*  93*  < > 95* 97 97  CO2 24  22  < > 29 26 25   GLUCOSE 107*  107*  < > 134*  133* 154*  BUN 39*  38*  < > 12 15 18   CREATININE 6.48*  6.73*  < > 3.44* 4.15* 4.77*  CALCIUM 10.0  9.9  < > 10.3 10.2 9.9  PHOS 7.1*  --   --   --   --   < > = values in this interval not displayed. Liver Function Tests:  Recent Labs Lab 06/09/13 1427 06/10/13 0430  AST 18  --   ALT 14  --   ALKPHOS 128*  --   BILITOT 0.3  --   PROT 9.8*  --   ALBUMIN 4.7 3.7    Recent Labs Lab 06/09/13 2235  LIPASE 17   CBC:  Recent Labs Lab 06/09/13 1427 06/09/13 2235 06/10/13 0430  WBC 8.7 11.4* 10.2  NEUTROABS 7.5 10.2*  --   HGB 12.1  13.9 10.5* 10.1*  HCT 36.3  41.0 31.4* 30.4*  MCV 95.5 96.3 96.8  PLT 239 212 188  Cardiac Enzymes:  Recent Labs Lab 06/09/13 2223 06/10/13 2253 06/11/13 0440  TROPONINI <0.30 <0.30 <0.30   CBG:  Recent Labs Lab 06/09/13 1340 06/10/13 1834 06/11/13 0909  GLUCAP 117* 153* 133*  Studies/Results: Ct Head Wo  Contrast  06/10/2013   *RADIOLOGY REPORT*  Clinical Data: Bone or spine  CT HEAD WITHOUT CONTRAST  Technique:  Contiguous axial images were obtained from the base of the skull through the vertex without contrast.  Comparison: Of MRI brain 06/20/2010.  CT head 06/20/2010.  Findings: Mild generalized atrophy and white matter disease is not significantly changed.  No acute cortical infarct, hemorrhage, mass lesion is present.  The ventricles are proportionate to the degree of atrophy and not significantly changed.  No significant extra- axial fluid collection is present.  The paranasal sinuses and mastoid air cells are clear.  The osseous skull is intact.  IMPRESSION:  1.  No acute intracranial abnormality or significant interval change. 2.  Similar appearance of atrophy and diffuse white matter disease.   Original Report Authenticated By: San Morelle, M.D.   Dg Chest Port 1 View  06/11/2013   *RADIOLOGY REPORT*  Clinical Data: Postoperative dialysis catheter placement.  PORTABLE CHEST - 1 VIEW  Comparison: 06/10/2013.   Findings: Trachea is midline.  Heart size stable.  Thoracic aorta is calcified.  A dialysis catheter is seen from an IVC approach, with tips projecting over the SVC.  Biapical pleural thickening. Probable linear scarring or subsegmental atelectasis at the lung bases.  No airspace consolidation or pleural fluid.  Degenerative changes are seen in the shoulders.  IMPRESSION:  1.  Dialysis catheter tips project over the SVC. 2.  Bibasilar atelectasis and/or scarring.   Original Report Authenticated By: Lorin Picket, M.D.   Dg Chest Port 1 View  06/10/2013   *RADIOLOGY REPORT*  Clinical Data: Change in respirations.  Question aspiration  PORTABLE CHEST - 1 VIEW  Comparison: 06/09/2013.  Findings: No infiltrate.  Mild central pulmonary vessel prominence without pulmonary edema.  No pneumothorax.  Nodularity peripheral aspect right lower lobe unchanged.  This is seen on chest CT of the 02/24/2013.  Calcified aorta.  Central line enters from below diaphragm.  There has been a change in the position of the tip of the central line which may be in the right atrium towards the tricuspid valve.  IMPRESSION: Central line enters from below diaphragm.  There has been a change in the position of the tip of the central line which may be in the right atrium towards the tricuspid valve.  Please see above.  This is a call report.   Original Report Authenticated By: Genia Del, M.D.   Dg Abd 2 Views  06/10/2013   *RADIOLOGY REPORT*  Clinical Data: Persistent nausea and vomiting.  ABDOMEN - 2 VIEW  Comparison: CT abdomen 03/08/2013  Findings: There is gas and stool throughout the colon.  No small or large bowel distension.  No free intra-abdominal air.  The no abnormal air fluid levels.  No radiopaque stones.  Degenerative changes and post kyphoplasty changes in the spine.  Vascular stents.  Right femoral venous catheter with tip over the right atrium.  Degenerative changes in the hips.  IMPRESSION:  Nonobstructive bowel gas  pattern.   Original Report Authenticated By: Lucienne Capers, M.D.   Medications:   . amLODipine  10 mg Oral Daily  . cefUROXime      . dextrose      . heparin  5,000 Units Subcutaneous Q8H  . losartan  100 mg Oral QPM  . metoprolol succinate  50 mg Oral Daily  .  morphine injection  4 mg Intramuscular Once  . multivitamin  1 tablet Oral Daily  . pantoprazole  40 mg Oral Daily  .  sevelamer carbonate  2,400 mg Oral TID WC  . sodium chloride  3 mL Intravenous Q12H

## 2013-06-11 NOTE — Progress Notes (Signed)
06/10/13 21:40 Patient awake and alert,trying to sit up in bed.When RN asked pt if she knows what happened earlier,she states "I was sleeping and you all woke me up",explained to pt what happened earlier,pt states "they gave me too many medicines".Will continue to monitor. Avilene Marrin Joselita,RN
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
## 2013-06-11 NOTE — Transfer of Care (Signed)
Immediate Anesthesia Transfer of Care Note  Patient: [name]  Procedure(s) Performed: Procedure(s) with comments: EXCHANGE OF A DIALYSIS CATHETER (Right) Venogram with angioplasty (Right) - RIGHT CENTRAL VENOGRAM WITH ANGIOPLASTY  Patient Location: PACU  Anesthesia Type:MAC  Level of Consciousness: awake, alert  and oriented  Airway & Oxygen Therapy: Patient Spontanous Breathing and Patient connected to nasal cannula oxygen  Post-op Assessment: Report given to PACU RN and Post -op Vital signs reviewed and stable  Post vital signs: Reviewed and stable  Complications: No apparent anesthesia complications

## 2013-06-11 NOTE — Progress Notes (Signed)
UR COMPLETED  

## 2013-06-11 NOTE — Anesthesia Postprocedure Evaluation (Signed)
  Anesthesia Post-op Note  Patient: [name]  Procedure(s) Performed: Procedure(s) with comments: EXCHANGE OF A DIALYSIS CATHETER (Right) Venogram with angioplasty (Right) - RIGHT CENTRAL VENOGRAM WITH ANGIOPLASTY  Patient Location: PACU  Anesthesia Type:MAC  Level of Consciousness: awake, alert  and oriented  Airway and Oxygen Therapy: Patient Spontanous Breathing and Patient connected to nasal cannula oxygen  Post-op Pain: none  Post-op Assessment: Post-op Vital signs reviewed, Patient's Cardiovascular Status Stable, Respiratory Function Stable, Patent Airway and No signs of Nausea or vomiting  Post-op Vital Signs: Reviewed and stable  Complications: No apparent anesthesia complications

## 2013-06-11 NOTE — Anesthesia Procedure Notes (Signed)
Procedure Name: MAC Date/Time: 06/11/2013 7:47 AM Performed by: Carola Frost Pre-anesthesia Checklist: Patient identified, Emergency Drugs available, Suction available, Patient being monitored and Timeout performed Patient Re-evaluated:Patient Re-evaluated prior to inductionOxygen Delivery Method: Nasal cannula Intubation Type: IV induction Placement Confirmation: positive ETCO2 and breath sounds checked- equal and bilateral Dental Injury: Teeth and Oropharynx as per pre-operative assessment

## 2013-06-11 NOTE — Op Note (Addendum)
OPERATIVE NOTE  PROCEDURE: 1.  Right femoral tunneled dialysis catheter exchange 2.  Left iliac venogram 3.  Inferior vena cavagram  4.  Fibrin sheath angioplasty 5.  Right common and external iliac vein venoplasty  PRE-OPERATIVE DIAGNOSIS: end-stage renal failure  POST-OPERATIVE DIAGNOSIS: same as above  SURGEON: [name], MD  ANESTHESIA: local and MAC  ESTIMATED BLOOD LOSS: minimal  FINDING(S): 1.  Right common iliac and external iliac femoral vein stenosis 50-75% (lumen narrowed down to tunneled dialysis catheter diameter) 2.  Widely patent inferior vena cava  3.  Fibrin sheath extending from iliac venous system up to proximal inferior vena cava  4.  Tips of the catheter in the right atrium on fluoroscopy  SPECIMEN(S):  none  INDICATIONS:   [name] is a 79 y.o. female who presents with poorly functioning long term indwelling right tunneled dialysis catheter.  The patient presents for femoral tunneled dialysis catheter exchange.  The patient is aware of the risks of tunneled dialysis catheter exchange include but are not limited to: bleeding, infection, central venous injury, possible venous stenosis, possible malpositioning in the venous system, and possible infections related to long-term catheter presence.  The patient was aware of these risks and agreed to proceed.  DESCRIPTION: After written full informed consent was obtained from the patient, the patient was taken back to the operating room.  Prior to induction, the patient was given IV antibiotics.  After obtaining adequate sedation, the patient was prepped and draped in the standard fashion for a femoral vein tunneled dialysis catheter exchange.  I anesthesized the subctuaneous tissue surrounding the tunneled dialysis catheter near the inguinal crease with a total of 25 cc of a 1:1 mixture of 0.5% Marcaine without epinepherine and 1% Lidocaine with epinepherine.  Note, the prior tunneled dialysis catheter cuff was  nearly loose from its subcutaneous tunnel already.  I made an incision over the catheter in the inguinal crease and bluntly dissected out the catheter.  I then clamped the catheter and sharply transected the distal portion of the catheter.  This distal portion was pushed out of the surgical field to prevent contamination of the field.  I then clamped one lumen with hemostat and then loaded an Benson wire through the remaining lumen.  Under fluoroscopy, the wire was advanced into the right atrium.  The catheter was removed over the wire.  A 10-Fr sheath was loaded over the wire into the external iliac vein with some resistance.  I did a hand injection via the sheath which demonstrated a hemodynamically significant right common and external iliac vein stenosis.  Essentially the vein had stenosed down to the tunneled dialysis catheter lumen size.  I connected the sheath to the power injector after a deairring and declotting maneuver.  Power injection imaging of the inferior vena cava and right common iliac vein was obtained in stations.  A fibrin sheath was imaged extending from the iliac vein to proximal inferior vena cava.  A 8 mm x 80 mm angioplasty balloon was inflated throughout the distribution of the fibrin sheath at 10 atm for 30 sec to disrupt the sheath.  The balloon inflations confirmed the presence of the fibrin sheath, as the balloon encountered limited resistance during its inflation.  This resistance should not have been present without the fibrin sheath, as the inferior vena cava is significantly bigger than the balloon.  I also completed a venoplasty of the right common iliac vein and external iliac vein at 10 atm for 1 minute  to assist placement of the tunneled dialysis catheter due to the hemodynamically significant stenoses.  At this point, an endhole catheter was loaded over the wire under fluoroscopy and the wire exchanged for an Amplatz wire.  The dilator sheath was loaded over the wire with some  difficulty due to the femoral venous stenosis.  A new 55-cm Diatek catheter was woven over the wire and advanced into the right atrium, under fluoroscopic guidance.  The wire was then removed under fluoroscopic guidance.  I advanced the catheter while peeling out the sheath.  I anesthesize a new tract from the cutdown incision to a new exit side.  I passed the metal dissector through this tract into the cutdown incision.  The metal dissector was connected to the back end of this catheter and the back end of the catheter was pulled through the new tunnel.  Tthe catheter hub was loaded onto the catheter.  The back end of this catheter was transected, revealing the two lumens of this catheter.  The ports were docked onto these two lumens.  The catheter hub was then screwed into place.  Each port was tested by aspirating and flushing.  No resistance was noted.  Each port was then thoroughly flushed with heparinized saline.  The catheter was secured in placed with two interrupted stitches of 3-0 Nylon tied to the catheter.  The neck incision was closed with a U-stitch of 4-0 Monocryl.  The neck and chest incision were cleaned and sterile bandages applied.  Each port was then loaded with concentrated heparin (1000 Units/mL) at the manufacturer recommended volumes to each port.  Sterile caps were applied to each port.  On completion fluoroscopy, the tips of the catheter were in the right atrium, and there was no evidence of pneumothorax.  COMPLICATIONS: none  CONDITION: stable   [name], MD [facility] [place] Office: [phone] Pager: [phone]  06/11/2013, 9:05 AM  9:05 AM

## 2013-06-11 NOTE — Progress Notes (Signed)
[facility] PROGRESS NOTE  [name] [id number] DOB: October 18, 1934 DOA: 06/09/2013 PCP: Vena Austria, MD  Chart reviewed  Assessment/Plan:  Now hypokalemic    Nausea and vomiting: Resolved. Tolerating solids.     HYPERTENSION: home meds resumed    ESRD (end stage renal disease)  Poorly functioning dialysis catheter: Had today: Right femoral tunneled dialysis catheter exchange  2. Left iliac venogram  3. Inferior vena cavagram  4. Fibrin sheath angioplasty  5. Right common and external iliac vein venoplasty    Peripheral vascular disease, unspecified    Anemia stable  No further problems with somnolence  Home soon if ok with consultants  Code Status: full Family Communication: none today Disposition Plan: home  Consultants:  Nephrology  VVS  Procedures: Right femoral tunneled dialysis catheter exchange  2. Left iliac venogram  3. Inferior vena cavagram  4. Fibrin sheath angioplasty  5. Right common and external iliac vein venoplasty  Antibiotics:    HPI/Subjective: Eating well. No  complaints  Objective: Filed Vitals:   06/11/13 1133 06/11/13 1200 06/11/13 1227 06/11/13 1326  BP: 163/54  144/63   Pulse: 98  108 104  Temp: 97.5 F (36.4 C) 97.4 F (36.3 C) 98.1 F (36.7 C)   TempSrc:   Oral   Resp: 11  12   Height:      Weight:      SpO2: 100%  99%     Intake/Output Summary (Last 24 hours) at 06/11/13 1640 Last data filed at 06/11/13 1200  Gross per 24 hour  Intake    665 ml  Output     15 ml  Net    650 ml   Filed Weights   06/09/13 2030 06/10/13 0825 06/10/13 2119  Weight: 55 kg (121 lb 4.1 oz) 56.6 kg (124 lb 12.5 oz) 57.244 kg (126 lb 3.2 oz)    Exam:   General:  Comfortable, alert, oriented.    Cardiovascular: Regular rate rhythm without murmurs gallops rubs  Respiratory: Clear auscultation bilaterally without wheeze rhonchi or rale  Abdomen: Normal bowel sounds. Soft nontender nondistended  EXT:   No clubbing cyanosis or edema  Data Reviewed: Basic Metabolic Panel:  Recent Labs Lab 06/10/13 0430 06/10/13 1550 06/10/13 2254 06/11/13 0440 06/11/13 1040 06/11/13 1538  NA 136  137 136 134* 135 137 134*  K 4.5  4.4 3.1* 3.3* 3.3* 4.1 3.2*  CL 93*  93* 97 95* 97 97 96  CO2 24  22 27 29 26 25 26   GLUCOSE 107*  107* 134* 134* 133* 154* 194*  BUN 39*  38* 8 12 15 18 19   CREATININE 6.48*  6.73* 2.54* 3.44* 4.15* 4.77* 5.23*  CALCIUM 10.0  9.9 9.2 10.3 10.2 9.9 9.7  PHOS 7.1*  --   --   --   --   --    Liver Function Tests:  Recent Labs Lab 06/09/13 1427 06/10/13 0430  AST 18  --   ALT 14  --   ALKPHOS 128*  --   BILITOT 0.3  --   PROT 9.8*  --   ALBUMIN 4.7 3.7    Recent Labs Lab 06/09/13 2235  LIPASE 17   No results found for this basename: AMMONIA,  in the last 168 hours CBC:  Recent Labs Lab 06/09/13 1427 06/09/13 2235 06/10/13 0430  WBC 8.7 11.4* 10.2  NEUTROABS 7.5 10.2*  --   HGB 12.1  13.9 10.5* 10.1*  HCT 36.3  41.0 31.4* 30.4*  MCV  95.5 96.3 96.8  PLT 239 212 188   Cardiac Enzymes:  Recent Labs Lab 06/09/13 2223 06/10/13 2253 06/11/13 0440  TROPONINI <0.30 <0.30 <0.30   BNP (last 3 results) No results found for this basename: PROBNP,  in the last 8760 hours CBG:  Recent Labs Lab 06/09/13 1340 06/10/13 1834 06/11/13 0909  GLUCAP 117* 153* 133*    Recent Results (from the past 240 hour(s))  SURGICAL PCR SCREEN     Status: None   Collection Time    06/11/13  4:49 AM      Result Value Range Status   MRSA, PCR NEGATIVE  NEGATIVE Final   Staphylococcus aureus NEGATIVE  NEGATIVE Final   Comment:            The Xpert SA Assay (FDA     approved for NASAL specimens     in patients over 42 years of age),     is one component of     a comprehensive surveillance     program.  Test performance has     been validated by [employer] for patients greater     than or equal to 22 year old.     It is not intended     to  diagnose infection nor to     guide or monitor treatment.     Studies: Ct Head Wo Contrast  06/10/2013   *RADIOLOGY REPORT*  Clinical Data: Bone or spine  CT HEAD WITHOUT CONTRAST  Technique:  Contiguous axial images were obtained from the base of the skull through the vertex without contrast.  Comparison: Of MRI brain 06/20/2010.  CT head 06/20/2010.  Findings: Mild generalized atrophy and white matter disease is not significantly changed.  No acute cortical infarct, hemorrhage, mass lesion is present.  The ventricles are proportionate to the degree of atrophy and not significantly changed.  No significant extra- axial fluid collection is present.  The paranasal sinuses and mastoid air cells are clear.  The osseous skull is intact.  IMPRESSION:  1.  No acute intracranial abnormality or significant interval change. 2.  Similar appearance of atrophy and diffuse white matter disease.   Original Report Authenticated By: San Morelle, M.D.   Dg Chest Port 1 View  06/11/2013   *RADIOLOGY REPORT*  Clinical Data: Postoperative dialysis catheter placement.  PORTABLE CHEST - 1 VIEW  Comparison: 06/10/2013.  Findings: Trachea is midline.  Heart size stable.  Thoracic aorta is calcified.  A dialysis catheter is seen from an IVC approach, with tips projecting over the SVC.  Biapical pleural thickening. Probable linear scarring or subsegmental atelectasis at the lung bases.  No airspace consolidation or pleural fluid.  Degenerative changes are seen in the shoulders.  IMPRESSION:  1.  Dialysis catheter tips project over the SVC. 2.  Bibasilar atelectasis and/or scarring.   Original Report Authenticated By: Lorin Picket, M.D.   Dg Chest Port 1 View  06/10/2013   *RADIOLOGY REPORT*  Clinical Data: Change in respirations.  Question aspiration  PORTABLE CHEST - 1 VIEW  Comparison: 06/09/2013.  Findings: No infiltrate.  Mild central pulmonary vessel prominence without pulmonary edema.  No pneumothorax.  Nodularity  peripheral aspect right lower lobe unchanged.  This is seen on chest CT of the 02/24/2013.  Calcified aorta.  Central line enters from below diaphragm.  There has been a change in the position of the tip of the central line which may be in the right atrium towards the tricuspid  valve.  IMPRESSION: Central line enters from below diaphragm.  There has been a change in the position of the tip of the central line which may be in the right atrium towards the tricuspid valve.  Please see above.  This is a call report.   Original Report Authenticated By: Genia Del, M.D.   Dg Abd 2 Views  06/10/2013   *RADIOLOGY REPORT*  Clinical Data: Persistent nausea and vomiting.  ABDOMEN - 2 VIEW  Comparison: CT abdomen 03/08/2013  Findings: There is gas and stool throughout the colon.  No small or large bowel distension.  No free intra-abdominal air.  The no abnormal air fluid levels.  No radiopaque stones.  Degenerative changes and post kyphoplasty changes in the spine.  Vascular stents.  Right femoral venous catheter with tip over the right atrium.  Degenerative changes in the hips.  IMPRESSION:  Nonobstructive bowel gas pattern.   Original Report Authenticated By: Lucienne Capers, M.D.    Scheduled Meds: . amLODipine  10 mg Oral Daily  . cefUROXime      . [START ON 06/12/2013] darbepoetin (ARANESP) injection - DIALYSIS  6.5476 mcg Intravenous Q Fri-HD  . dextrose      . [START ON 06/12/2013] doxercalciferol  2 mcg Intravenous Q M,W,F-HD  . heparin  5,000 Units Subcutaneous Q8H  . losartan  100 mg Oral QPM  . metoprolol succinate  50 mg Oral Daily  .  morphine injection  4 mg Intramuscular Once  . multivitamin  1 tablet Oral Daily  . pantoprazole  40 mg Oral Daily  . sevelamer carbonate  2,400 mg Oral TID WC  . sodium chloride  3 mL Intravenous Q12H   Continuous Infusions:   Time spent: 35 minutes  [facility] [facility] Pager [phone]. If 7PM-7AM, please contact night-coverage at  [email or web address], password [facility] 06/11/2013, 4:40 PM  LOS: 2 days

## 2013-06-12 LAB — CBC
HCT: 28.5 % — ABNORMAL LOW (ref 36.0–46.0)
Hemoglobin: 9.5 g/dL — ABNORMAL LOW (ref 12.0–15.0)
MCH: 31.8 pg (ref 26.0–34.0)
MCHC: 33.3 g/dL (ref 30.0–36.0)
MCV: 95.3 fL (ref 78.0–100.0)
Platelets: 139 10*3/uL — ABNORMAL LOW (ref 150–400)
RBC: 2.99 MIL/uL — ABNORMAL LOW (ref 3.87–5.11)
RDW: 13.3 % (ref 11.5–15.5)
WBC: 5.7 10*3/uL (ref 4.0–10.5)

## 2013-06-12 LAB — RENAL FUNCTION PANEL
Albumin: 3.3 g/dL — ABNORMAL LOW (ref 3.5–5.2)
BUN: 23 mg/dL (ref 6–23)
CO2: 28 mEq/L (ref 19–32)
Calcium: 9.6 mg/dL (ref 8.4–10.5)
Chloride: 93 mEq/L — ABNORMAL LOW (ref 96–112)
Creatinine, Ser: 6.76 mg/dL — ABNORMAL HIGH (ref 0.50–1.10)
GFR calc Af Amer: 6 mL/min — ABNORMAL LOW (ref >90)
GFR calc non Af Amer: 5 mL/min — ABNORMAL LOW (ref >90)
Glucose, Bld: 123 mg/dL — ABNORMAL HIGH (ref 70–99)
Phosphorus: 2.7 mg/dL (ref 2.3–4.6)
Potassium: 3.4 mEq/L — ABNORMAL LOW (ref 3.5–5.1)
Sodium: 134 mEq/L — ABNORMAL LOW (ref 135–145)

## 2013-06-12 MED ORDER — DOXERCALCIFEROL 4 MCG/2ML IV SOLN
INTRAVENOUS | Status: AC
  Administered 2013-06-12: 2 ug via INTRAVENOUS
  Filled 2013-06-12: qty 2

## 2013-06-12 MED ORDER — SODIUM CHLORIDE 0.9 % IV SOLN: 100.0000 mL | INTRAVENOUS | Status: DC | PRN

## 2013-06-12 MED ORDER — PENTAFLUOROPROP-TETRAFLUOROETH EX AERO: 1.0000 "application " | INHALATION_SPRAY | CUTANEOUS | Status: DC | PRN

## 2013-06-12 MED ORDER — ALTEPLASE 2 MG IJ SOLR
2.0000 mg | Freq: Once | INTRAMUSCULAR | Status: DC | PRN
  Filled 2013-06-12: qty 2

## 2013-06-12 MED ORDER — LIDOCAINE HCL (PF) 1 % IJ SOLN: 5.0000 mL | INTRAMUSCULAR | Status: DC | PRN

## 2013-06-12 MED ORDER — NEPRO/CARBSTEADY PO LIQD: 237.0000 mL | ORAL | Status: DC | PRN

## 2013-06-12 MED ORDER — LIDOCAINE-PRILOCAINE 2.5-2.5 % EX CREA: 1.0000 "application " | TOPICAL_CREAM | CUTANEOUS | Status: DC | PRN

## 2013-06-12 MED ORDER — DARBEPOETIN ALFA-POLYSORBATE 25 MCG/0.42ML IJ SOLN
INTRAMUSCULAR | Status: AC
  Administered 2013-06-12: 6.5476 ug via INTRAVENOUS
  Filled 2013-06-12: qty 0.42

## 2013-06-12 MED ORDER — HEPARIN SODIUM (PORCINE) 1000 UNIT/ML DIALYSIS
20.0000 [IU]/kg | INTRAMUSCULAR | Status: DC | PRN
  Filled 2013-06-12: qty 2

## 2013-06-12 MED ORDER — HEPARIN SODIUM (PORCINE) 1000 UNIT/ML DIALYSIS
1000.0000 [IU] | INTRAMUSCULAR | Status: DC | PRN
  Filled 2013-06-12: qty 1

## 2013-06-12 NOTE — Progress Notes (Signed)
Subjective:  No current complaints, no nausea   Objective: Vital signs in last 24 hours: Temp:  [97.4 F (36.3 C)-98.8 F (37.1 C)] 98.8 F (37.1 C) (06/13 0513) Pulse Rate:  [87-108] 104 (06/13 0513) Resp:  [9-18] 18 (06/13 0513) BP: (136-174)/(49-84) 161/84 mmHg (06/13 0513) SpO2:  [99 %-100 %] 99 % (06/13 0513) Weight:  [58.8 kg (129 lb 10.1 oz)] 58.8 kg (129 lb 10.1 oz) (06/12 2200) Weight change: 2.2 kg (4 lb 13.6 oz)  Intake/Output from previous day: 06/12 0701 - 06/13 0700 In: 425 [I.V.:425] Out: 15 [Blood:15]   EXAM: General appearance:  Alert, in no apparent distress Resp:  CTA without rales, rhonchi, or wheezes Cardio:  RRR without murmur or rub GI: + BS, soft and nontender Extremities:  No edema Access:  New right femoral tunneled dialysis catheter  Lab Results:  Recent Labs  06/09/13 2235 06/10/13 0430  WBC 11.4* 10.2  HGB 10.5* 10.1*  HCT 31.4* 30.4*  PLT 212 188   BMET:  Recent Labs  06/09/13 1427  06/10/13 0430  06/11/13 1538 06/11/13 2247  NA 133*  131*  < > 136  137  < > 134* 134*  K 7.5*  7.4*  < > 4.5  4.4  < > 3.2* 3.4*  CL 83*  95*  < > 93*  93*  < > 96 96  CO2 24  < > 24  22  < > 26 27  GLUCOSE 127*  129*  < > 107*  107*  < > 194* 115*  BUN 100*  101*  < > 39*  38*  < > 19 21  CREATININE 13.00*  12.50*  < > 6.48*  6.73*  < > 5.23* 5.83*  CALCIUM 11.0*  < > 10.0  9.9  < > 9.7 9.9  ALBUMIN 4.7  --  3.7  --   --   --   < > = values in this interval not displayed. No results found for this basename: PTH,  in the last 72 hours Iron Studies: No results found for this basename: IRON, TIBC, TRANSFERRIN, FERRITIN,  in the last 72 hours  Dialysis Orders: Center: [facility] on MWF.  EDW 54 kg HD Bath 2K/2.25Ca Time 3 hrs 45 mins Heparin 7000-U bolus, then 3000 U mid-Tx. Access Right femoral catheter BFR 400 DFR 800 Hectorol 3 mcg IV/HD Epogen 1000 Units IV/HD Venofer 0 Profile 4.  Assessment/Plan: 1. Nausea & vomiting - resolved after  HD on 6/9 & 11, recently with poor treatments secondary to poorly functioning catheter.  2. Poorly functioning dialysis catheter - R femoral catheter exchange, L iliac venogram with fibrin sheath angioplasty & R common and external iliac venoplasty yesterday by Dr. Bridgett Larsson.  3. ESRD - HD on MWF @ [facility], femoral catheter exchanged yesterday. HD today to maintain schedule.  4. Hypertension/volume - BP 161/84, now on Amlodipine 10 mg qd, losartan 100 mg qhs, Metoprolol 50 mg qd; wt 58.8 kg yesterday with EDW of 55.  5. Anemia - Hgb 10.1, Aranesp 6.25 mcg today.  Check CBC. 6. Metabolic bone disease - Ca 9.9 (10.1 corrected), P 7.1, iPTH 744 (4/23); Hectorol 3 mcg, Phoslo 3 with meals. Hectorol on hold, switched to Renvela, secondary to high Ca..  7. Nutrition - Alb 3.7, renal diet, vitamin.  8. DM - per primary. 9. Back pain - pain in lower back and upper legs , appt with Dr. Ellene Route 6/19, Hx compression fxs s/p acrylic balloon kyphoplasty @ L2, L5 11/26.  LOS: 3 days   [name],CHARLES 06/12/2013,6:50 AM  I have seen and examined this patient and agree with plan as outlined above.  Difficult situation. Still only 300BFR but OLC green.  Possible RUE AVG per Dr. Bridgett Larsson.  Will d/w VVS regarding new access for the future as she is not a candidate for thigh AVG.  Patient was seen on dialysis and the procedure was supervised. BFR 300 Via R femoral cath BP is 174/145.  Patient appears to be tolerating treatment well.  OLC green.  BP is variable. Will follow after UF.  Marland Kitchen Lamari Beckles A,MD 06/12/2013 8:31 AM

## 2013-06-12 NOTE — Progress Notes (Signed)
[facility]   Daily Progress Note  Assessment/Planning: POD #1 s/p R TDC exchange, fibrin sheath angioplasty, L CIV and EIV venoplasty   TDC functioning well (green light)  The pt has a significant R femoral vein stenosis.  I suspect placement of a new TDC via the R femoral approach via new stick may very difficult.  In the event the R TDC is compromised and cannot be used for an exchanged.  The R Subclavian vein remains a possibility, though obviously this would prevent any further arm access.  Realistically this patient is no longer a candidate for a thigh AVG.  The IVC remains open for secondary catheter such as lumbar and transhepatic catheters.  Available as needed.  Subjective  - 1 Day Post-Op  Feeling better  Objective Filed Vitals:   06/12/13 0513 06/12/13 0720 06/12/13 0734 06/12/13 0741  BP: 161/84 174/90 203/83 195/75  Pulse: 104 99 85 83  Temp: 98.8 F (37.1 C) 98.1 F (36.7 C)    TempSrc: Oral Oral    Resp: 18 18 22 20   Height:      Weight:  128 lb 4.9 oz (58.2 kg)    SpO2: 99% 99%      Intake/Output Summary (Last 24 hours) at 06/12/13 0800 Last data filed at 06/11/13 1200  Gross per 24 hour  Intake    425 ml  Output     15 ml  Net    410 ml   VASC  R femoral TDC attached to HD machine (green light), bandaged  Laboratory CBC    Component Value Date/Time   WBC 5.7 06/12/2013 0739   WBC 6.2 11/06/2006 0920   HGB 9.5* 06/12/2013 0739   HGB 12.2 11/06/2006 0920   HCT 28.5* 06/12/2013 0739   HCT 37.7 11/06/2006 0920   PLT 139* 06/12/2013 0739   PLT 375 11/06/2006 0920    BMET    Component Value Date/Time   NA 134* 06/11/2013 2247   K 3.4* 06/11/2013 2247   CL 96 06/11/2013 2247   CO2 27 06/11/2013 2247   GLUCOSE 115* 06/11/2013 2247   BUN 21 06/11/2013 2247   CREATININE 5.83* 06/11/2013 2247   CALCIUM 9.9 06/11/2013 2247   GFRNONAA 6* 06/11/2013 2247   GFRAA 7* 06/11/2013 2247    [name], MD [facility] [place] Office: [phone] Pager: [phone]  06/12/2013, 8:00 AM

## 2013-06-12 NOTE — Discharge Summary (Addendum)
Physician Discharge Summary  [name] [id number] DOB: [birth year] DOA: 06/09/2013  PCP: Vena Austria, MD  Admit date: 06/09/2013 Discharge date: 06/12/2013  Time spent: greater than 30 minutes  Discharge Diagnoses:  Principal Problem:   HYPERKALEMIA Active Problems:   DIABETES MELLITUS, TYPE II   HYPERTENSION   ESRD (end stage renal disease)   Nausea and vomiting   Peripheral vascular disease, unspecified   Anemia   Discharge Condition: stable  Filed Weights   06/11/13 2200 06/12/13 0720 06/12/13 1146  Weight: 58.8 kg (129 lb 10.1 oz) 58.2 kg (128 lb 4.9 oz) 56.4 kg (124 lb 5.4 oz)    History of present illness:  79 y.o. female with prior h/o ESRD on HD (M/W/F), hypertension, severe PVD, and diet controlled DM presents with vomiting that started on Sunday 6/8. She is unable to keep down water or any of her medications. She has not had a bowel movement since Sunday. She has not had any blood or coffee grounds in her emesis. She is also complaining of epigastric pain - although she states that it is not as severe as her hospitalization in March 2014 (Enteritis). She did not go to HD on Monday 6/9. In the emergency department she is found to have a serum potassium of 7.4. She admits to indulging in a lot of potassium rich foods lately (bananas, oranges, tomatoes). Finally she reports falling 4 x in the past few months. She lives with her grand-daughter but is alone greater that 50% of the time.  Hospital Course:  Emergently dialized.  Nausea and vomiting related to HD cath not working well PTA and resulting uremia. Potassium normalized and nausea resolved after dialysis. Vascular performed right femoral tunneled dialysis catheter exchange, fibrin sheath angioplasty, right common and external iliac vein venoplasty  Procedures: )Right femoral tunneled dialysis catheter exchange  2. Left iliac venogram  3. Inferior vena cavagram  4. Fibrin sheath angioplasty  5. Right  common and external iliac vein venoplasty  Consultations:  Nephrology  Vascular  Discharge Exam: Filed Vitals:   06/12/13 1100 06/12/13 1130 06/12/13 1146 06/12/13 1201  BP: 159/70 187/74 192/87 180/75  Pulse: 80 84 92 97  Temp:   98 F (36.7 C) 98.7 F (37.1 C)  TempSrc:   Oral Oral  Resp: 16 18 19 20   Height:      Weight:   56.4 kg (124 lb 5.4 oz)   SpO2:   98% 100%    General: alert, oriented, comfortable Cardiovascular: RRR without MGR Respiratory: CTA without WRR Abd S, NT, ND  Discharge Instructions  Discharge Orders   Future Appointments Provider Department Dept Phone   08/04/2013 10:00 AM [facility] Lab 2 [facility] [phone]   08/04/2013 10:30 AM [facility] Lab 2 [facility] - [phone]   08/04/2013 11:00 AM Princess Perna, NP [facility] [phone]   Future Orders Complete By Expires     Activity as tolerated - No restrictions  As directed     Discharge instructions  As directed     Comments:      Renal diet        Medication List    TAKE these medications       amLODipine 10 MG tablet  Commonly known as:  NORVASC  Take 10 mg by mouth daily.     calcium acetate 667 MG capsule  Commonly known as:  PHOSLO  Take 2,001 mg by mouth 3 (three) times daily  with meals.     calcium carbonate 500 MG chewable tablet  Commonly known as:  TUMS - dosed in mg elemental calcium  Chew 1 tablet by mouth as needed for heartburn.     cloNIDine 0.2 MG tablet  Commonly known as:  CATAPRES  Take 0.2 mg by mouth 2 (two) times daily.     losartan 100 MG tablet  Commonly known as:  COZAAR  Take 100 mg by mouth every evening.     metoprolol succinate 50 MG 24 hr tablet  Commonly known as:  TOPROL-XL  Take 50 mg by mouth daily. Take with or immediately following a meal.     pantoprazole 40 MG tablet  Commonly known as:  PROTONIX  Take 40 mg by mouth daily.     tiZANidine 4 MG  tablet  Commonly known as:  ZANAFLEX  Take 4 mg by mouth 2 (two) times daily as needed (spasms).     traMADol 50 MG tablet  Commonly known as:  ULTRAM  Take 50-100 mg by mouth every 6 (six) hours as needed. For pain       Allergies  Allergen Reactions  . Ace Inhibitors Other (See Comments)    Reaction unknown      The results of significant diagnostics from this hospitalization (including imaging, microbiology, ancillary and laboratory) are listed below for reference.    Significant Diagnostic Studies: Ct Head Wo Contrast  06/10/2013   *RADIOLOGY REPORT*  Clinical Data: Bone or spine  CT HEAD WITHOUT CONTRAST  Technique:  Contiguous axial images were obtained from the base of the skull through the vertex without contrast.  Comparison: Of MRI brain 06/20/2010.  CT head 06/20/2010.  Findings: Mild generalized atrophy and white matter disease is not significantly changed.  No acute cortical infarct, hemorrhage, mass lesion is present.  The ventricles are proportionate to the degree of atrophy and not significantly changed.  No significant extra- axial fluid collection is present.  The paranasal sinuses and mastoid air cells are clear.  The osseous skull is intact.  IMPRESSION:  1.  No acute intracranial abnormality or significant interval change. 2.  Similar appearance of atrophy and diffuse white matter disease.   Original Report Authenticated By: San Morelle, M.D.   Dg Chest Port 1 View  06/11/2013   *RADIOLOGY REPORT*  Clinical Data: Postoperative dialysis catheter placement.  PORTABLE CHEST - 1 VIEW  Comparison: 06/10/2013.  Findings: Trachea is midline.  Heart size stable.  Thoracic aorta is calcified.  A dialysis catheter is seen from an IVC approach, with tips projecting over the SVC.  Biapical pleural thickening. Probable linear scarring or subsegmental atelectasis at the lung bases.  No airspace consolidation or pleural fluid.  Degenerative changes are seen in the shoulders.   IMPRESSION:  1.  Dialysis catheter tips project over the SVC. 2.  Bibasilar atelectasis and/or scarring.   Original Report Authenticated By: Lorin Picket, M.D.   Dg Chest Port 1 View  06/10/2013   *RADIOLOGY REPORT*  Clinical Data: Change in respirations.  Question aspiration  PORTABLE CHEST - 1 VIEW  Comparison: 06/09/2013.  Findings: No infiltrate.  Mild central pulmonary vessel prominence without pulmonary edema.  No pneumothorax.  Nodularity peripheral aspect right lower lobe unchanged.  This is seen on chest CT of the 02/24/2013.  Calcified aorta.  Central line enters from below diaphragm.  There has been a change in the position of the tip of the central line which may be in the right atrium towards  the tricuspid valve.  IMPRESSION: Central line enters from below diaphragm.  There has been a change in the position of the tip of the central line which may be in the right atrium towards the tricuspid valve.  Please see above.  This is a call report.   Original Report Authenticated By: Genia Del, M.D.   Dg Chest Port 1 View  06/09/2013   *RADIOLOGY REPORT*  Clinical Data: Vomiting  PORTABLE CHEST - 1 VIEW  Comparison: 04/21/2013  Findings: Cardiac size is stable.  Atherosclerotic calcifications of thoracic aorta again noted.  No acute infiltrate or pulmonary edema.  Stable scarring in the right lower lobe laterally.  IMPRESSION: No active disease.  No significant change.   Original Report Authenticated By: Lahoma Crocker, M.D.   Dg Abd 2 Views  06/10/2013   *RADIOLOGY REPORT*  Clinical Data: Persistent nausea and vomiting.  ABDOMEN - 2 VIEW  Comparison: CT abdomen 03/08/2013  Findings: There is gas and stool throughout the colon.  No small or large bowel distension.  No free intra-abdominal air.  The no abnormal air fluid levels.  No radiopaque stones.  Degenerative changes and post kyphoplasty changes in the spine.  Vascular stents.  Right femoral venous catheter with tip over the right atrium.   Degenerative changes in the hips.  IMPRESSION:  Nonobstructive bowel gas pattern.   Original Report Authenticated By: Lucienne Capers, M.D.    Microbiology: Recent Results (from the past 240 hour(s))  SURGICAL PCR SCREEN     Status: None   Collection Time    06/11/13  4:49 AM      Result Value Range Status   MRSA, PCR NEGATIVE  NEGATIVE Final   Staphylococcus aureus NEGATIVE  NEGATIVE Final   Comment:            The Xpert SA Assay (FDA     approved for NASAL specimens     in patients over 60 years of age),     is one component of     a comprehensive surveillance     program.  Test performance has     been validated by [employer] for patients greater     than or equal to 68 year old.     It is not intended     to diagnose infection nor to     guide or monitor treatment.     Labs: Basic Metabolic Panel:  Recent Labs Lab 06/10/13 0430  06/11/13 0440 06/11/13 1040 06/11/13 1538 06/11/13 2247 06/12/13 0739  NA 136  137  < > 135 137 134* 134* 134*  K 4.5  4.4  < > 3.3* 4.1 3.2* 3.4* 3.4*  CL 93*  93*  < > 97 97 96 96 93*  CO2 24  22  < > 26 25 26 27 28   GLUCOSE 107*  107*  < > 133* 154* 194* 115* 123*  BUN 39*  38*  < > 15 18 19 21 23   CREATININE 6.48*  6.73*  < > 4.15* 4.77* 5.23* 5.83* 6.76*  CALCIUM 10.0  9.9  < > 10.2 9.9 9.7 9.9 9.6  PHOS 7.1*  --   --   --   --   --  2.7  < > = values in this interval not displayed. Liver Function Tests:  Recent Labs Lab 06/09/13 1427 06/10/13 0430 06/12/13 0739  AST 18  --   --   ALT 14  --   --  ALKPHOS 128*  --   --   BILITOT 0.3  --   --   PROT 9.8*  --   --   ALBUMIN 4.7 3.7 3.3*    Recent Labs Lab 06/09/13 2235  LIPASE 17   No results found for this basename: AMMONIA,  in the last 168 hours CBC:  Recent Labs Lab 06/09/13 1427 06/09/13 2235 06/10/13 0430 06/12/13 0739  WBC 8.7 11.4* 10.2 5.7  NEUTROABS 7.5 10.2*  --   --   HGB 12.1  13.9 10.5* 10.1* 9.5*  HCT 36.3  41.0 31.4* 30.4*  28.5*  MCV 95.5 96.3 96.8 95.3  PLT 239 212 188 139*   Cardiac Enzymes:  Recent Labs Lab 06/09/13 2223 06/10/13 2253 06/11/13 0440  TROPONINI <0.30 <0.30 <0.30   BNP: BNP (last 3 results) No results found for this basename: PROBNP,  in the last 8760 hours CBG:  Recent Labs Lab 06/09/13 1340 06/10/13 1834 06/11/13 0909  GLUCAP 117* 153* 133*    Signed:  Raekwon Winkowski L  [facility] 06/12/2013, 1:13 PM

## 2013-06-12 NOTE — Progress Notes (Signed)
Patient discharged to home. Patient AVS reviewed with patient and patient's daughter at bedside. Patient verbalized understanding of medications and follow-up appointments.  Patient remains stable; no signs or symptoms of distress.  Patient educated to return to the ER in cases of SOB, dizziness, fever, chest pain, or fainting.

## 2013-06-16 ENCOUNTER — Encounter (HOSPITAL_COMMUNITY): Payer: Self-pay | Admitting: Vascular Surgery

## 2013-08-04 ENCOUNTER — Ambulatory Visit: Payer: Medicare Other | Admitting: Neurosurgery

## 2013-08-13 ENCOUNTER — Other Ambulatory Visit: Payer: Self-pay | Admitting: Nephrology

## 2013-08-13 ENCOUNTER — Ambulatory Visit
Admission: RE | Admit: 2013-08-13 | Discharge: 2013-08-13 | Disposition: A | Discharge status: Home / Self Care | Payer: Medicare Other | Source: Ambulatory Visit | Attending: Nephrology | Admitting: Nephrology

## 2013-08-13 DIAGNOSIS — R05 Cough: Secondary | ICD-10-CM

## 2013-09-07 ENCOUNTER — Encounter: Payer: Self-pay | Admitting: Vascular Surgery

## 2013-09-07 ENCOUNTER — Other Ambulatory Visit: Payer: Self-pay | Admitting: Gastroenterology

## 2013-09-07 NOTE — Addendum Note (Signed)
Addended by: Vernie Ammons. on: 09/07/2013 09:05 AM   Modules accepted: Orders

## 2013-09-08 ENCOUNTER — Ambulatory Visit (INDEPENDENT_AMBULATORY_CARE_PROVIDER_SITE_OTHER): Payer: Medicare Other | Admitting: Vascular Surgery

## 2013-09-08 ENCOUNTER — Encounter (INDEPENDENT_AMBULATORY_CARE_PROVIDER_SITE_OTHER): Payer: Medicare Other | Admitting: *Deleted

## 2013-09-08 ENCOUNTER — Encounter: Payer: Self-pay | Admitting: Vascular Surgery

## 2013-09-08 VITALS — BP 150/80 | HR 65 | Ht <= 58 in | Wt 127.0 lb

## 2013-09-08 DIAGNOSIS — I739 Peripheral vascular disease, unspecified: Secondary | ICD-10-CM

## 2013-09-08 DIAGNOSIS — N186 End stage renal disease: Secondary | ICD-10-CM

## 2013-09-08 DIAGNOSIS — Z48812 Encounter for surgical aftercare following surgery on the circulatory system: Secondary | ICD-10-CM

## 2013-09-08 NOTE — Addendum Note (Signed)
Addended by: Dorthula Rue L on: 09/08/2013 04:16 PM   Modules accepted: Orders
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
## 2013-09-10 ENCOUNTER — Ambulatory Visit (HOSPITAL_COMMUNITY)
Admission: RE | Admit: 2013-09-10 | Discharge: 2013-09-10 | Disposition: A | Discharge status: Home / Self Care | Payer: Medicare Other | Source: Ambulatory Visit | Attending: Gastroenterology | Admitting: Gastroenterology

## 2013-09-10 ENCOUNTER — Encounter (HOSPITAL_COMMUNITY): Payer: Self-pay

## 2013-09-10 ENCOUNTER — Encounter (HOSPITAL_COMMUNITY)
Admission: RE | Disposition: A | Discharge status: Home / Self Care | Payer: Self-pay | Source: Ambulatory Visit | Attending: Gastroenterology

## 2013-09-10 DIAGNOSIS — R1013 Epigastric pain: Secondary | ICD-10-CM | POA: Insufficient documentation

## 2013-09-10 DIAGNOSIS — K449 Diaphragmatic hernia without obstruction or gangrene: Secondary | ICD-10-CM | POA: Insufficient documentation

## 2013-09-10 DIAGNOSIS — K559 Vascular disorder of intestine, unspecified: Secondary | ICD-10-CM | POA: Insufficient documentation

## 2013-09-10 DIAGNOSIS — K219 Gastro-esophageal reflux disease without esophagitis: Secondary | ICD-10-CM | POA: Insufficient documentation

## 2013-09-10 DIAGNOSIS — K2210 Ulcer of esophagus without bleeding: Secondary | ICD-10-CM | POA: Insufficient documentation

## 2013-09-10 DIAGNOSIS — R110 Nausea: Secondary | ICD-10-CM | POA: Insufficient documentation

## 2013-09-10 DIAGNOSIS — Z992 Dependence on renal dialysis: Secondary | ICD-10-CM | POA: Insufficient documentation

## 2013-09-10 HISTORY — PX: ESOPHAGOGASTRODUODENOSCOPY: SHX5428

## 2013-09-10 SURGERY — EGD (ESOPHAGOGASTRODUODENOSCOPY)
Anesthesia: Moderate Sedation

## 2013-09-10 MED ORDER — MIDAZOLAM HCL 10 MG/2ML IJ SOLN
INTRAMUSCULAR | Status: DC | PRN
  Administered 2013-09-10: 1 mg via INTRAVENOUS
  Administered 2013-09-10: 2 mg via INTRAVENOUS

## 2013-09-10 MED ORDER — FENTANYL CITRATE 0.05 MG/ML IJ SOLN
INTRAMUSCULAR | Status: AC
  Filled 2013-09-10: qty 2

## 2013-09-10 MED ORDER — MIDAZOLAM HCL 10 MG/2ML IJ SOLN
INTRAMUSCULAR | Status: AC
  Filled 2013-09-10: qty 2

## 2013-09-10 MED ORDER — BUTAMBEN-TETRACAINE-BENZOCAINE 2-2-14 % EX AERO
INHALATION_SPRAY | CUTANEOUS | Status: DC | PRN
  Administered 2013-09-10: 2 via TOPICAL

## 2013-09-10 MED ORDER — FENTANYL CITRATE 0.05 MG/ML IJ SOLN
INTRAMUSCULAR | Status: DC | PRN
  Administered 2013-09-10: 25 ug via INTRAVENOUS

## 2013-09-10 MED ORDER — SODIUM CHLORIDE 0.9 % IV SOLN
INTRAVENOUS | Status: DC
  Administered 2013-09-10: 09:00:00 via INTRAVENOUS

## 2013-09-10 NOTE — Op Note (Signed)
[facility] [place], [id number]   ENDOSCOPY PROCEDURE REPORT  PATIENT: Margaret, [name]  MR#: [id number] BIRTHDATE: June 20, 1934 , 79  yrs. old GENDER: Female ENDOSCOPIST:Alp Goldwater Oletta Lamas, MD REFERRED BY:  Dr Alyson Ingles, Dr Marval Regal, Dr Sheran Lawless PROCEDURE DATE:  09/10/2013 PROCEDURE:   EGD ASA CLASS:  class III INDICATIONS: persistent epigastric pain and nausea and hemodialysis patient with reflux and known mild mesenteric ischemia MEDICATION:   fentanyl 25 mcg, versed 3 mg IV TOPICAL ANESTHETIC:   cetacaine spray  DESCRIPTION OF PROCEDURE:   The Pentax upper endoscope was passed into the esophagus with swallowing. The stomach were centered in the pylorus identified and passed. The duodenum including the 2nd portion and duodenal bulb were normal. bile is present in the duodenum. There were no ulceration or inflammation. Pyloric channel was normal. The antrum was free of ulceration or inflammation. The stomach exam and in the forward and retroflex view was normal. There was a hiatal hernia and a widely patent GU junction. Right at the Z line was a small linear esophageal ulcer it was not actively bleeding. The remainder of the esophagus was endoscopically normal. The scope was withdrawn the patient tolerated procedure well.     COMPLICATIONS: None  ENDOSCOPIC IMPRESSION: 1. Esophageal Ulcer. Probably the source of her abdominal epigastric pain. Suspected esophageal reflux.  RECOMMENDATIONS: 1. we will continue all of her current medications and will double her protonix to be ID. 2. She will follow-up with me in the office in about 6 weeks.    _______________________________ Lorrin MaisLaurence Spates, MD 09/10/2013 10:01 AM  CC:Dr Delfino Lovett, Dr. Bridgett Larsson, Dr Marval Regal
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
## 2013-09-11 ENCOUNTER — Encounter (HOSPITAL_COMMUNITY): Payer: Self-pay | Admitting: Gastroenterology

## 2013-09-18 ENCOUNTER — Other Ambulatory Visit: Payer: Self-pay | Admitting: Neurological Surgery

## 2013-09-18 DIAGNOSIS — M5416 Radiculopathy, lumbar region: Secondary | ICD-10-CM

## 2013-09-30 DIAGNOSIS — N2581 Secondary hyperparathyroidism of renal origin: Secondary | ICD-10-CM | POA: Diagnosis not present

## 2013-09-30 DIAGNOSIS — D509 Iron deficiency anemia, unspecified: Secondary | ICD-10-CM | POA: Diagnosis not present

## 2013-09-30 DIAGNOSIS — D631 Anemia in chronic kidney disease: Secondary | ICD-10-CM | POA: Diagnosis not present

## 2013-09-30 DIAGNOSIS — N186 End stage renal disease: Secondary | ICD-10-CM | POA: Diagnosis not present

## 2013-10-01 ENCOUNTER — Ambulatory Visit
Admission: RE | Admit: 2013-10-01 | Discharge: 2013-10-01 | Disposition: A | Discharge status: Home / Self Care | Payer: Medicare Other | Source: Ambulatory Visit | Attending: Neurological Surgery | Admitting: Neurological Surgery

## 2013-10-01 DIAGNOSIS — M5416 Radiculopathy, lumbar region: Secondary | ICD-10-CM

## 2013-10-21 DIAGNOSIS — E1129 Type 2 diabetes mellitus with other diabetic kidney complication: Secondary | ICD-10-CM | POA: Diagnosis not present

## 2013-10-30 DIAGNOSIS — N186 End stage renal disease: Secondary | ICD-10-CM | POA: Diagnosis not present

## 2013-11-02 DIAGNOSIS — N186 End stage renal disease: Secondary | ICD-10-CM | POA: Diagnosis not present

## 2013-11-02 DIAGNOSIS — E875 Hyperkalemia: Secondary | ICD-10-CM | POA: Diagnosis not present

## 2013-11-02 DIAGNOSIS — D509 Iron deficiency anemia, unspecified: Secondary | ICD-10-CM | POA: Diagnosis not present

## 2013-11-02 DIAGNOSIS — D631 Anemia in chronic kidney disease: Secondary | ICD-10-CM | POA: Diagnosis not present

## 2013-11-02 DIAGNOSIS — N2581 Secondary hyperparathyroidism of renal origin: Secondary | ICD-10-CM | POA: Diagnosis not present

## 2013-11-29 DIAGNOSIS — N186 End stage renal disease: Secondary | ICD-10-CM | POA: Diagnosis not present

## 2013-11-30 DIAGNOSIS — Z23 Encounter for immunization: Secondary | ICD-10-CM | POA: Diagnosis not present

## 2013-11-30 DIAGNOSIS — E8779 Other fluid overload: Secondary | ICD-10-CM | POA: Diagnosis not present

## 2013-11-30 DIAGNOSIS — D509 Iron deficiency anemia, unspecified: Secondary | ICD-10-CM | POA: Diagnosis not present

## 2013-11-30 DIAGNOSIS — N2581 Secondary hyperparathyroidism of renal origin: Secondary | ICD-10-CM | POA: Diagnosis not present

## 2013-11-30 DIAGNOSIS — D631 Anemia in chronic kidney disease: Secondary | ICD-10-CM | POA: Diagnosis not present

## 2013-11-30 DIAGNOSIS — N186 End stage renal disease: Secondary | ICD-10-CM | POA: Diagnosis not present

## 2013-12-01 ENCOUNTER — Encounter: Payer: Self-pay | Admitting: Vascular Surgery

## 2013-12-23 ENCOUNTER — Encounter: Payer: Self-pay | Admitting: Vascular Surgery

## 2013-12-25 ENCOUNTER — Encounter: Payer: Self-pay | Admitting: Vascular Surgery

## 2013-12-25 ENCOUNTER — Ambulatory Visit (INDEPENDENT_AMBULATORY_CARE_PROVIDER_SITE_OTHER): Payer: Medicare Other | Admitting: Vascular Surgery

## 2013-12-25 ENCOUNTER — Encounter (HOSPITAL_COMMUNITY): Payer: Medicare Other

## 2013-12-25 ENCOUNTER — Other Ambulatory Visit (HOSPITAL_COMMUNITY): Payer: Medicare Other

## 2013-12-25 VITALS — BP 150/80 | HR 75 | Resp 16 | Ht <= 58 in | Wt 138.0 lb

## 2013-12-25 DIAGNOSIS — N186 End stage renal disease: Secondary | ICD-10-CM

## 2013-12-25 NOTE — Progress Notes (Signed)
    Established Dialysis Access  History of Present Illness  [name] is a 79 y.o. ([birth year]) female who presents for re-evaluation for permanent access.  Nothing has changed since her most recent procedure on 06/11/13: R TDC exchanged with fibrin sheath angioplasty.  The [facility] ran well in the green zone after that procedure.  Now the flow rates are decreasing but reportedly only periodically in the yellow zone.  The patient's PMH, PSH, SH, FamHx, Med, and Allergies are unchanged from 05/29/13.  On ROS today: no rest pain, no intermittent claudication   Physical Examination  Filed Vitals:   12/25/13 1531  BP: 150/80  Pulse: 75  Resp: 16  Height: 4\' 10"  (1.473 m)  Weight: 138 lb (62.596 kg)  SpO2: 100%   Body mass index is 28.85 kg/(m^2).  General: A&O x 3, WD, elderly   Pulmonary: Sym exp, good air movt, CTAB, no rales, rhonchi, & wheezing  Cardiac: RRR, Nl S1, S2, no Murmurs, rubs or gallops  Vascular: palpable R brachial pulse, L fem TDC  Gastrointestinal: soft, NTND, -G/R, - HSM, - masses, - CVAT B  Musculoskeletal: M/S 5/5 throughout , Extremities without  ischemic changes   Neurologic: Pain and light touch intact in extremities , Motor exam as listed above  Medical Decision Making  [name] is a 79 y.o. female who presents with ESRD requiring hemodialysis.   Again, her only access option is a redo RUA AVG with a Gore hybrid AVG vs HeRO graft-cath.  She continues to be reluctant to proceed.  If her TDC flow rates are more consistently poor, repeat TDC exchange with fibrin sheath angioplasty is necessary.    The R SCV also remains a possible location for [facility] placement before consider my exotic locations: translumbar and transhepatic approach (IR)  [name], MD [facility] [place]: [phone] Pager: [phone]  12/25/2013, 4:21 PM

## 2013-12-30 DIAGNOSIS — N186 End stage renal disease: Secondary | ICD-10-CM | POA: Diagnosis not present

## 2014-01-01 DIAGNOSIS — N186 End stage renal disease: Secondary | ICD-10-CM | POA: Diagnosis not present

## 2014-01-01 DIAGNOSIS — D631 Anemia in chronic kidney disease: Secondary | ICD-10-CM | POA: Diagnosis not present

## 2014-01-01 DIAGNOSIS — N2581 Secondary hyperparathyroidism of renal origin: Secondary | ICD-10-CM | POA: Diagnosis not present

## 2014-01-01 DIAGNOSIS — D509 Iron deficiency anemia, unspecified: Secondary | ICD-10-CM | POA: Diagnosis not present

## 2014-01-27 DIAGNOSIS — N186 End stage renal disease: Secondary | ICD-10-CM | POA: Diagnosis not present

## 2014-01-27 DIAGNOSIS — E1129 Type 2 diabetes mellitus with other diabetic kidney complication: Secondary | ICD-10-CM | POA: Diagnosis not present

## 2014-01-30 DIAGNOSIS — N186 End stage renal disease: Secondary | ICD-10-CM | POA: Diagnosis not present

## 2014-02-01 DIAGNOSIS — D631 Anemia in chronic kidney disease: Secondary | ICD-10-CM | POA: Diagnosis not present

## 2014-02-01 DIAGNOSIS — N186 End stage renal disease: Secondary | ICD-10-CM | POA: Diagnosis not present

## 2014-02-01 DIAGNOSIS — N039 Chronic nephritic syndrome with unspecified morphologic changes: Secondary | ICD-10-CM | POA: Diagnosis not present

## 2014-02-01 DIAGNOSIS — N2581 Secondary hyperparathyroidism of renal origin: Secondary | ICD-10-CM | POA: Diagnosis not present

## 2014-02-27 DIAGNOSIS — N186 End stage renal disease: Secondary | ICD-10-CM | POA: Diagnosis not present

## 2014-03-01 DIAGNOSIS — D631 Anemia in chronic kidney disease: Secondary | ICD-10-CM | POA: Diagnosis not present

## 2014-03-01 DIAGNOSIS — N2581 Secondary hyperparathyroidism of renal origin: Secondary | ICD-10-CM | POA: Diagnosis not present

## 2014-03-01 DIAGNOSIS — N186 End stage renal disease: Secondary | ICD-10-CM | POA: Diagnosis not present

## 2014-03-03 ENCOUNTER — Encounter (HOSPITAL_COMMUNITY): Payer: Self-pay | Admitting: Pharmacy Technician

## 2014-03-05 ENCOUNTER — Other Ambulatory Visit: Payer: Self-pay

## 2014-03-08 ENCOUNTER — Encounter (HOSPITAL_COMMUNITY): Payer: Self-pay | Admitting: *Deleted

## 2014-03-08 MED ORDER — DEXTROSE 5 % IV SOLN
1.5000 g | INTRAVENOUS | Status: AC
  Administered 2014-03-09: 1.5 g via INTRAVENOUS
  Filled 2014-03-08: qty 1.5

## 2014-03-08 NOTE — Progress Notes (Signed)
Ms Ciaburri reports of "bad chest pain", ",mid chest to sides", she rates it 7 out of 10, denies SOB. Patient states that she has not seen cardiologist in a long time.  Notes in EPIC from 2013, patient was seen for "daily chest pain" and had a normal  stress test.  Ms Halicki states that the pain has not been any different in the past 2 years.  "I just let the pain wear off".  I informed Dr Linna Caprice of these findings.

## 2014-03-09 ENCOUNTER — Inpatient Hospital Stay (HOSPITAL_COMMUNITY)
Admission: RE | Admit: 2014-03-09 | Discharge: 2014-03-11 | DRG: 252 | Disposition: A | Discharge status: Home / Self Care | Payer: Medicare Other | Source: Ambulatory Visit | Attending: Vascular Surgery | Admitting: Vascular Surgery

## 2014-03-09 ENCOUNTER — Ambulatory Visit (HOSPITAL_COMMUNITY): Payer: Medicare Other | Admitting: Anesthesiology

## 2014-03-09 ENCOUNTER — Encounter (HOSPITAL_COMMUNITY): Payer: Medicare Other | Admitting: Anesthesiology

## 2014-03-09 ENCOUNTER — Encounter (HOSPITAL_COMMUNITY)
Admission: RE | Disposition: A | Discharge status: Home / Self Care | Payer: Self-pay | Source: Ambulatory Visit | Attending: Vascular Surgery

## 2014-03-09 ENCOUNTER — Encounter (HOSPITAL_COMMUNITY): Payer: Self-pay | Admitting: *Deleted

## 2014-03-09 ENCOUNTER — Ambulatory Visit (HOSPITAL_COMMUNITY): Payer: Medicare Other

## 2014-03-09 DIAGNOSIS — I951 Orthostatic hypotension: Secondary | ICD-10-CM | POA: Diagnosis not present

## 2014-03-09 DIAGNOSIS — I959 Hypotension, unspecified: Secondary | ICD-10-CM | POA: Diagnosis not present

## 2014-03-09 DIAGNOSIS — M129 Arthropathy, unspecified: Secondary | ICD-10-CM | POA: Diagnosis present

## 2014-03-09 DIAGNOSIS — E119 Type 2 diabetes mellitus without complications: Secondary | ICD-10-CM | POA: Diagnosis not present

## 2014-03-09 DIAGNOSIS — Z79899 Other long term (current) drug therapy: Secondary | ICD-10-CM

## 2014-03-09 DIAGNOSIS — T82898A Other specified complication of vascular prosthetic devices, implants and grafts, initial encounter: Secondary | ICD-10-CM | POA: Diagnosis not present

## 2014-03-09 DIAGNOSIS — Z452 Encounter for adjustment and management of vascular access device: Secondary | ICD-10-CM | POA: Diagnosis not present

## 2014-03-09 DIAGNOSIS — D631 Anemia in chronic kidney disease: Secondary | ICD-10-CM | POA: Diagnosis not present

## 2014-03-09 DIAGNOSIS — T82598A Other mechanical complication of other cardiac and vascular devices and implants, initial encounter: Secondary | ICD-10-CM | POA: Diagnosis not present

## 2014-03-09 DIAGNOSIS — I739 Peripheral vascular disease, unspecified: Secondary | ICD-10-CM | POA: Diagnosis present

## 2014-03-09 DIAGNOSIS — I96 Gangrene, not elsewhere classified: Secondary | ICD-10-CM | POA: Diagnosis not present

## 2014-03-09 DIAGNOSIS — G8929 Other chronic pain: Secondary | ICD-10-CM | POA: Diagnosis present

## 2014-03-09 DIAGNOSIS — Y841 Kidney dialysis as the cause of abnormal reaction of the patient, or of later complication, without mention of misadventure at the time of the procedure: Secondary | ICD-10-CM | POA: Diagnosis present

## 2014-03-09 DIAGNOSIS — E785 Hyperlipidemia, unspecified: Secondary | ICD-10-CM | POA: Diagnosis present

## 2014-03-09 DIAGNOSIS — I9589 Other hypotension: Secondary | ICD-10-CM | POA: Diagnosis not present

## 2014-03-09 DIAGNOSIS — Z87891 Personal history of nicotine dependence: Secondary | ICD-10-CM

## 2014-03-09 DIAGNOSIS — N2581 Secondary hyperparathyroidism of renal origin: Secondary | ICD-10-CM | POA: Diagnosis present

## 2014-03-09 DIAGNOSIS — D638 Anemia in other chronic diseases classified elsewhere: Secondary | ICD-10-CM | POA: Diagnosis present

## 2014-03-09 DIAGNOSIS — M949 Disorder of cartilage, unspecified: Secondary | ICD-10-CM

## 2014-03-09 DIAGNOSIS — I120 Hypertensive chronic kidney disease with stage 5 chronic kidney disease or end stage renal disease: Secondary | ICD-10-CM | POA: Diagnosis present

## 2014-03-09 DIAGNOSIS — Z8249 Family history of ischemic heart disease and other diseases of the circulatory system: Secondary | ICD-10-CM | POA: Diagnosis not present

## 2014-03-09 DIAGNOSIS — Z833 Family history of diabetes mellitus: Secondary | ICD-10-CM | POA: Diagnosis not present

## 2014-03-09 DIAGNOSIS — M549 Dorsalgia, unspecified: Secondary | ICD-10-CM | POA: Diagnosis not present

## 2014-03-09 DIAGNOSIS — I10 Essential (primary) hypertension: Secondary | ICD-10-CM | POA: Diagnosis not present

## 2014-03-09 DIAGNOSIS — R0989 Other specified symptoms and signs involving the circulatory and respiratory systems: Secondary | ICD-10-CM | POA: Diagnosis not present

## 2014-03-09 DIAGNOSIS — N186 End stage renal disease: Secondary | ICD-10-CM | POA: Diagnosis present

## 2014-03-09 DIAGNOSIS — M899 Disorder of bone, unspecified: Secondary | ICD-10-CM | POA: Diagnosis present

## 2014-03-09 DIAGNOSIS — S98919A Complete traumatic amputation of unspecified foot, level unspecified, initial encounter: Secondary | ICD-10-CM | POA: Diagnosis not present

## 2014-03-09 DIAGNOSIS — K219 Gastro-esophageal reflux disease without esophagitis: Secondary | ICD-10-CM | POA: Diagnosis not present

## 2014-03-09 DIAGNOSIS — Z992 Dependence on renal dialysis: Secondary | ICD-10-CM | POA: Diagnosis not present

## 2014-03-09 DIAGNOSIS — Z01818 Encounter for other preprocedural examination: Secondary | ICD-10-CM | POA: Diagnosis not present

## 2014-03-09 HISTORY — PX: AV FISTULA PLACEMENT: SHX1204

## 2014-03-09 LAB — COMPREHENSIVE METABOLIC PANEL
ALK PHOS: 86 U/L (ref 39–117)
ALT: 13 U/L (ref 0–35)
AST: 13 U/L (ref 0–37)
Albumin: 4 g/dL (ref 3.5–5.2)
BUN: 31 mg/dL — ABNORMAL HIGH (ref 6–23)
CALCIUM: 8.4 mg/dL (ref 8.4–10.5)
CO2: 21 meq/L (ref 19–32)
Chloride: 98 mEq/L (ref 96–112)
Creatinine, Ser: 7.23 mg/dL — ABNORMAL HIGH (ref 0.50–1.10)
GFR, EST AFRICAN AMERICAN: 6 mL/min — AB (ref >90)
GFR, EST NON AFRICAN AMERICAN: 5 mL/min — AB (ref >90)
Glucose, Bld: 140 mg/dL — ABNORMAL HIGH (ref 70–99)
POTASSIUM: 5.5 meq/L — AB (ref 3.7–5.3)
SODIUM: 135 meq/L — AB (ref 137–147)
Total Bilirubin: 0.3 mg/dL (ref 0.3–1.2)
Total Protein: 7.2 g/dL (ref 6.0–8.3)

## 2014-03-09 LAB — GLUCOSE, CAPILLARY
Glucose-Capillary: 107 mg/dL — ABNORMAL HIGH (ref 70–99)
Glucose-Capillary: 90 mg/dL (ref 70–99)
Glucose-Capillary: 148 mg/dL — ABNORMAL HIGH (ref 70–99)

## 2014-03-09 LAB — CBC
HCT: 26.5 % — ABNORMAL LOW (ref 36.0–46.0)
Hemoglobin: 8.6 g/dL — ABNORMAL LOW (ref 12.0–15.0)
MCH: 32.1 pg (ref 26.0–34.0)
MCHC: 32.5 g/dL (ref 30.0–36.0)
MCV: 98.9 fL (ref 78.0–100.0)
Platelets: 126 10*3/uL — ABNORMAL LOW (ref 150–400)
RBC: 2.68 MIL/uL — ABNORMAL LOW (ref 3.87–5.11)
RDW: 13.4 % (ref 11.5–15.5)
WBC: 5.8 10*3/uL (ref 4.0–10.5)

## 2014-03-09 LAB — POCT I-STAT 4, (NA,K, GLUC, HGB,HCT)
GLUCOSE: 129 mg/dL — AB (ref 70–99)
HCT: 30 % — ABNORMAL LOW (ref 36.0–46.0)
Hemoglobin: 10.2 g/dL — ABNORMAL LOW (ref 12.0–15.0)
Potassium: 4.7 mEq/L (ref 3.7–5.3)
Sodium: 135 mEq/L — ABNORMAL LOW (ref 137–147)

## 2014-03-09 SURGERY — INSERTION OF ARTERIOVENOUS (AV) GORE-TEX GRAFT ARM
Anesthesia: Monitor Anesthesia Care | Site: Arm Upper | Laterality: Right

## 2014-03-09 MED ORDER — METOPROLOL SUCCINATE ER 50 MG PO TB24
50.0000 mg | ORAL_TABLET | Freq: Every day | ORAL | Status: DC
  Filled 2014-03-09 (×3): qty 1

## 2014-03-09 MED ORDER — OXYCODONE-ACETAMINOPHEN 5-325 MG PO TABS: 1.0000 | ORAL_TABLET | Freq: Four times a day (QID) | ORAL | Status: DC | PRN

## 2014-03-09 MED ORDER — ONDANSETRON HCL 4 MG/2ML IJ SOLN
INTRAMUSCULAR | Status: DC | PRN
  Administered 2014-03-09: 4 mg via INTRAVENOUS

## 2014-03-09 MED ORDER — THROMBIN 20000 UNITS EX SOLR
CUTANEOUS | Status: AC
  Filled 2014-03-09: qty 20000

## 2014-03-09 MED ORDER — LABETALOL HCL 5 MG/ML IV SOLN
10.0000 mg | INTRAVENOUS | Status: DC | PRN
  Filled 2014-03-09: qty 4

## 2014-03-09 MED ORDER — OXYCODONE-ACETAMINOPHEN 5-325 MG PO TABS
1.0000 | ORAL_TABLET | ORAL | Status: DC | PRN
  Administered 2014-03-09: 1 via ORAL
  Administered 2014-03-10 – 2014-03-11 (×2): 2 via ORAL
  Filled 2014-03-09: qty 2
  Filled 2014-03-09: qty 1

## 2014-03-09 MED ORDER — THROMBIN 20000 UNITS EX SOLR
CUTANEOUS | Status: DC | PRN
  Administered 2014-03-09: 09:00:00 via TOPICAL

## 2014-03-09 MED ORDER — LIDOCAINE-EPINEPHRINE (PF) 1 %-1:200000 IJ SOLN
INTRAMUSCULAR | Status: DC | PRN
  Administered 2014-03-09: 30 mL

## 2014-03-09 MED ORDER — ROCURONIUM BROMIDE 50 MG/5ML IV SOLN
INTRAVENOUS | Status: AC
  Filled 2014-03-09: qty 1

## 2014-03-09 MED ORDER — MORPHINE SULFATE 2 MG/ML IJ SOLN
2.0000 mg | INTRAMUSCULAR | Status: DC | PRN
  Administered 2014-03-10: 2 mg via INTRAVENOUS
  Filled 2014-03-09: qty 1

## 2014-03-09 MED ORDER — HYDRALAZINE HCL 20 MG/ML IJ SOLN: 10.0000 mg | INTRAMUSCULAR | Status: DC | PRN

## 2014-03-09 MED ORDER — BUPIVACAINE HCL (PF) 0.5 % IJ SOLN
INTRAMUSCULAR | Status: DC | PRN
  Administered 2014-03-09: 30 mL

## 2014-03-09 MED ORDER — PANTOPRAZOLE SODIUM 40 MG PO TBEC
40.0000 mg | DELAYED_RELEASE_TABLET | Freq: Every day | ORAL | Status: DC
  Filled 2014-03-09: qty 1

## 2014-03-09 MED ORDER — ONDANSETRON HCL 4 MG/2ML IJ SOLN
4.0000 mg | Freq: Four times a day (QID) | INTRAMUSCULAR | Status: DC | PRN
  Administered 2014-03-11: 4 mg via INTRAVENOUS
  Filled 2014-03-09: qty 2

## 2014-03-09 MED ORDER — IODIXANOL 320 MG/ML IV SOLN: INTRAVENOUS | Status: DC | PRN

## 2014-03-09 MED ORDER — CEFAZOLIN SODIUM-DEXTROSE 2-3 GM-% IV SOLR
2.0000 g | INTRAVENOUS | Status: AC
  Administered 2014-03-10: 2 g via INTRAVENOUS
  Filled 2014-03-09: qty 50

## 2014-03-09 MED ORDER — PHENOL 1.4 % MT LIQD
1.0000 | OROMUCOSAL | Status: DC | PRN
  Filled 2014-03-09: qty 177

## 2014-03-09 MED ORDER — FENTANYL CITRATE 0.05 MG/ML IJ SOLN
INTRAMUSCULAR | Status: DC | PRN
  Administered 2014-03-09 (×5): 25 ug via INTRAVENOUS
  Administered 2014-03-09 (×2): 50 ug via INTRAVENOUS

## 2014-03-09 MED ORDER — MIDAZOLAM HCL 5 MG/5ML IJ SOLN
INTRAMUSCULAR | Status: DC | PRN
  Administered 2014-03-09: 1 mg via INTRAVENOUS

## 2014-03-09 MED ORDER — ALBUMIN HUMAN 5 % IV SOLN
INTRAVENOUS | Status: AC
  Filled 2014-03-09: qty 250

## 2014-03-09 MED ORDER — DARBEPOETIN ALFA-POLYSORBATE 100 MCG/0.5ML IJ SOLN
100.0000 ug | INTRAMUSCULAR | Status: DC
  Administered 2014-03-10: 100 ug via INTRAVENOUS
  Filled 2014-03-09: qty 0.5

## 2014-03-09 MED ORDER — PHENYLEPHRINE HCL 10 MG/ML IJ SOLN
INTRAMUSCULAR | Status: DC | PRN
  Administered 2014-03-09: 40 ug via INTRAVENOUS
  Administered 2014-03-09: 80 ug via INTRAVENOUS
  Administered 2014-03-09: 40 ug via INTRAVENOUS
  Administered 2014-03-09 (×3): 80 ug via INTRAVENOUS
  Administered 2014-03-09: 40 ug via INTRAVENOUS
  Administered 2014-03-09: 80 ug via INTRAVENOUS

## 2014-03-09 MED ORDER — LOSARTAN POTASSIUM 50 MG PO TABS
100.0000 mg | ORAL_TABLET | Freq: Every evening | ORAL | Status: DC
  Filled 2014-03-09: qty 2

## 2014-03-09 MED ORDER — ONDANSETRON HCL 4 MG/2ML IJ SOLN
INTRAMUSCULAR | Status: AC
  Filled 2014-03-09: qty 2

## 2014-03-09 MED ORDER — HYDROMORPHONE HCL PF 1 MG/ML IJ SOLN: 0.2500 mg | INTRAMUSCULAR | Status: DC | PRN

## 2014-03-09 MED ORDER — CLONIDINE HCL 0.2 MG PO TABS
0.2000 mg | ORAL_TABLET | Freq: Two times a day (BID) | ORAL | Status: DC
  Filled 2014-03-09 (×5): qty 1

## 2014-03-09 MED ORDER — SODIUM CHLORIDE 0.9 % IV SOLN
INTRAVENOUS | Status: DC | PRN
  Administered 2014-03-09 (×2): via INTRAVENOUS

## 2014-03-09 MED ORDER — CINACALCET HCL 30 MG PO TABS
30.0000 mg | ORAL_TABLET | Freq: Every day | ORAL | Status: DC
  Administered 2014-03-11: 30 mg via ORAL
  Filled 2014-03-09 (×3): qty 1

## 2014-03-09 MED ORDER — METOPROLOL TARTRATE 1 MG/ML IV SOLN: 2.0000 mg | INTRAVENOUS | Status: DC | PRN

## 2014-03-09 MED ORDER — CEFAZOLIN SODIUM 1-5 GM-% IV SOLN
1.0000 g | INTRAVENOUS | Status: DC
  Filled 2014-03-09: qty 50

## 2014-03-09 MED ORDER — GUAIFENESIN-DM 100-10 MG/5ML PO SYRP: 15.0000 mL | ORAL_SOLUTION | ORAL | Status: DC | PRN

## 2014-03-09 MED ORDER — 0.9 % SODIUM CHLORIDE (POUR BTL) OPTIME
TOPICAL | Status: DC | PRN
  Administered 2014-03-09: 1000 mL

## 2014-03-09 MED ORDER — POTASSIUM CHLORIDE CRYS ER 20 MEQ PO TBCR: 20.0000 meq | EXTENDED_RELEASE_TABLET | Freq: Once | ORAL | Status: DC

## 2014-03-09 MED ORDER — FENTANYL CITRATE 0.05 MG/ML IJ SOLN
INTRAMUSCULAR | Status: AC
  Filled 2014-03-09: qty 5

## 2014-03-09 MED ORDER — SODIUM CHLORIDE 0.9 % IV SOLN
INTRAVENOUS | Status: DC
  Administered 2014-03-09: 14:00:00 via INTRAVENOUS
  Administered 2014-03-10: 50 mL/h via INTRAVENOUS
  Administered 2014-03-10: 21:00:00 via INTRAVENOUS

## 2014-03-09 MED ORDER — LOSARTAN POTASSIUM 50 MG PO TABS
100.0000 mg | ORAL_TABLET | Freq: Every evening | ORAL | Status: DC
  Filled 2014-03-09 (×2): qty 2

## 2014-03-09 MED ORDER — SODIUM CHLORIDE 0.9 % IR SOLN
Status: DC | PRN
  Administered 2014-03-09: 08:00:00

## 2014-03-09 MED ORDER — PROPOFOL 10 MG/ML IV BOLUS
INTRAVENOUS | Status: DC | PRN
  Administered 2014-03-09 (×4): 10 mg via INTRAVENOUS
  Administered 2014-03-09: 20 mg via INTRAVENOUS
  Administered 2014-03-09 (×3): 10 mg via INTRAVENOUS

## 2014-03-09 MED ORDER — LIDOCAINE HCL (CARDIAC) 20 MG/ML IV SOLN
INTRAVENOUS | Status: AC
  Filled 2014-03-09: qty 5

## 2014-03-09 MED ORDER — ACETAMINOPHEN 325 MG PO TABS: 325.0000 mg | ORAL_TABLET | ORAL | Status: DC | PRN

## 2014-03-09 MED ORDER — PROPOFOL 10 MG/ML IV BOLUS
INTRAVENOUS | Status: AC
  Filled 2014-03-09: qty 20

## 2014-03-09 MED ORDER — ALBUMIN HUMAN 5 % IV SOLN
INTRAVENOUS | Status: DC | PRN
  Administered 2014-03-09 (×2): via INTRAVENOUS

## 2014-03-09 MED ORDER — CALCIUM ACETATE 667 MG PO CAPS
2001.0000 mg | ORAL_CAPSULE | Freq: Three times a day (TID) | ORAL | Status: DC
  Administered 2014-03-11: 2001 mg via ORAL
  Filled 2014-03-09 (×8): qty 3

## 2014-03-09 MED ORDER — DOXERCALCIFEROL 4 MCG/2ML IV SOLN
3.0000 ug | INTRAVENOUS | Status: DC
  Administered 2014-03-10: 3 ug via INTRAVENOUS
  Filled 2014-03-09: qty 2

## 2014-03-09 MED ORDER — SUCCINYLCHOLINE CHLORIDE 20 MG/ML IJ SOLN
INTRAMUSCULAR | Status: AC
  Filled 2014-03-09: qty 1

## 2014-03-09 MED ORDER — ACETAMINOPHEN 650 MG RE SUPP: 325.0000 mg | RECTAL | Status: DC | PRN

## 2014-03-09 MED ORDER — PHENYLEPHRINE 40 MCG/ML (10ML) SYRINGE FOR IV PUSH (FOR BLOOD PRESSURE SUPPORT)
PREFILLED_SYRINGE | INTRAVENOUS | Status: AC
  Filled 2014-03-09: qty 10

## 2014-03-09 MED ORDER — EPHEDRINE SULFATE 50 MG/ML IJ SOLN
INTRAMUSCULAR | Status: AC
  Filled 2014-03-09: qty 1

## 2014-03-09 MED ORDER — ALBUMIN HUMAN 5 % IV SOLN
12.5000 g | Freq: Once | INTRAVENOUS | Status: AC
  Administered 2014-03-09: 12.5 g via INTRAVENOUS

## 2014-03-09 MED ORDER — MIDAZOLAM HCL 2 MG/2ML IJ SOLN
INTRAMUSCULAR | Status: AC
  Filled 2014-03-09: qty 2

## 2014-03-09 SURGICAL SUPPLY — 53 items
ADH SKN CLS APL DERMABOND .7 (GAUZE/BANDAGES/DRESSINGS) ×1
ARMBAND PINK RESTRICT EXTREMIT (MISCELLANEOUS) ×3 IMPLANT
CANISTER SUCTION 2500CC (MISCELLANEOUS) ×3 IMPLANT
CLIP TI MEDIUM 6 (CLIP) ×3 IMPLANT
CLIP TI WIDE RED SMALL 6 (CLIP) ×3 IMPLANT
COVER SURGICAL LIGHT HANDLE (MISCELLANEOUS) ×3 IMPLANT
DECANTER SPIKE VIAL GLASS SM (MISCELLANEOUS) ×1 IMPLANT
DERMABOND ADVANCED (GAUZE/BANDAGES/DRESSINGS) ×2
DERMABOND ADVANCED .7 DNX12 (GAUZE/BANDAGES/DRESSINGS) ×1 IMPLANT
DRAPE C-ARM 42X72 X-RAY (DRAPES) ×2 IMPLANT
DRAPE ORTHO SPLIT 77X108 STRL (DRAPES) ×3
DRAPE SURG ORHT 6 SPLT 77X108 (DRAPES) IMPLANT
ELECT REM PT RETURN 9FT ADLT (ELECTROSURGICAL) ×3
ELECTRODE REM PT RTRN 9FT ADLT (ELECTROSURGICAL) ×1 IMPLANT
GLOVE BIO SURGEON STRL SZ7 (GLOVE) ×7 IMPLANT
GLOVE BIOGEL PI IND STRL 6.5 (GLOVE) IMPLANT
GLOVE BIOGEL PI IND STRL 7.0 (GLOVE) IMPLANT
GLOVE BIOGEL PI IND STRL 7.5 (GLOVE) ×1 IMPLANT
GLOVE BIOGEL PI INDICATOR 6.5 (GLOVE) ×8
GLOVE BIOGEL PI INDICATOR 7.0 (GLOVE) ×2
GLOVE BIOGEL PI INDICATOR 7.5 (GLOVE) ×10
GLOVE ECLIPSE 6.0 STRL STRAW (GLOVE) ×4 IMPLANT
GLOVE ECLIPSE 6.5 STRL STRAW (GLOVE) ×2 IMPLANT
GLOVE ECLIPSE 7.0 STRL STRAW (GLOVE) ×2 IMPLANT
GLOVE SS BIOGEL STRL SZ 7 (GLOVE) IMPLANT
GLOVE SUPERSENSE BIOGEL SZ 7 (GLOVE) ×4
GOWN STRL REUS W/ TWL LRG LVL3 (GOWN DISPOSABLE) ×3 IMPLANT
GOWN STRL REUS W/ TWL XL LVL3 (GOWN DISPOSABLE) IMPLANT
GOWN STRL REUS W/TWL LRG LVL3 (GOWN DISPOSABLE) ×18
GOWN STRL REUS W/TWL XL LVL3 (GOWN DISPOSABLE) ×6
INSERT FOGARTY SM (MISCELLANEOUS) ×2 IMPLANT
KIT BASIN OR (CUSTOM PROCEDURE TRAY) ×3 IMPLANT
KIT ROOM TURNOVER OR (KITS) ×3 IMPLANT
NS IRRIG 1000ML POUR BTL (IV SOLUTION) ×3 IMPLANT
PACK CV ACCESS (CUSTOM PROCEDURE TRAY) ×3 IMPLANT
PAD ARMBOARD 7.5X6 YLW CONV (MISCELLANEOUS) ×6 IMPLANT
SET MICROPUNCTURE 5F STIFF (MISCELLANEOUS) ×2 IMPLANT
SHEATH AVANTI 11CM 5FR (MISCELLANEOUS) ×2 IMPLANT
SPONGE SURGIFOAM ABS GEL 100 (HEMOSTASIS) IMPLANT
SUT MNCRL AB 4-0 PS2 18 (SUTURE) ×5 IMPLANT
SUT PROLENE 5 0 C 1 24 (SUTURE) IMPLANT
SUT PROLENE 6 0 BV (SUTURE) ×4 IMPLANT
SUT PROLENE 7 0 BV 1 (SUTURE) IMPLANT
SUT SILK 2 0 FS (SUTURE) ×1 IMPLANT
SUT VIC AB 3-0 SH 27 (SUTURE) ×6
SUT VIC AB 3-0 SH 27X BRD (SUTURE) ×2 IMPLANT
SYR 20CC LL (SYRINGE) ×2 IMPLANT
SYRINGE 10CC LL (SYRINGE) ×2 IMPLANT
TOWEL OR 17X24 6PK STRL BLUE (TOWEL DISPOSABLE) ×3 IMPLANT
TOWEL OR 17X26 10 PK STRL BLUE (TOWEL DISPOSABLE) ×3 IMPLANT
UNDERPAD 30X30 INCONTINENT (UNDERPADS AND DIAPERS) ×3 IMPLANT
WATER STERILE IRR 1000ML POUR (IV SOLUTION) ×3 IMPLANT
WIRE BENTSON .035X145CM (WIRE) ×2 IMPLANT

## 2014-03-09 NOTE — Progress Notes (Signed)
Pt wants to go home.. Dr Ola Spurr updated. Will cont to monitor pt in PACU and update Dr Bridgett Larsson when he is done in [place]

## 2014-03-09 NOTE — Consult Note (Signed)
[facility] Renal Consultation Note  Indication for Consultation:  Management of ESRD/hemodialysis; anemia, hypertension/volume and secondary hyperparathyroidism  HPI: [name] is a 80 y.o. female admitted by Dr. Bridgett Larsson with hypotension after attempted new RUA hybrid dialysis graft.  Procedure was terminated due to a friable axillary vein so no new access was placed. Dr Bridgett Larsson did axillary exploration with partial excision of old R AV graft and ligation of right axillary vein.  Pt noted that her HD cath has been having flow problems and pt is being admitted overnight to replace the catheter in the morning. " She developed some hypotension post op but is asymptomatic, alert and oriented.  She is having multiple access issues secondary to her severe peripheral vascular disease. Currently in room with daughter no cos.of dizziness , cp, sob, abd pain  And neg ros.     Past Medical History  Diagnosis Date  . Hypertension   . Gout, unspecified 1980's    "not anymore" (11/25/2012)  . Mild mitral valve stenosis   . Thyroid disease   . Gangrene     left fifth toe  . Peripheral vascular disease   . Hyperlipidemia   . Aortic valve sclerosis   . Shoulder fracture, right 2012  . Blood transfusion 1960's  . Chronic back pain   . Other specified cardiac dysrhythmias(427.89)     sees Dr. Angelena Form  . Heart murmur     "I think so" (11/25/2012)  . Anginal pain   . Asthma     "used to; not anymore" (11/25/2012)  . Pneumonia     "once; years ago" (11/25/2012)  . Type II diabetes mellitus     "states not on any medication at this time"  . Anemia   . Arthritis     "all over" (11/25/2012)  . Exertional shortness of breath   . ESRD (end stage renal disease) on dialysis     M, W, Fr. [place] dialysis (06/09/2013)    Past Surgical History  Procedure Laterality Date  . Av fistula repair      "right/left arm fistula failed; removed left thigh d/t poor circulation"  (11/25/2012)  . Arteriovenous graft placement      left femoral loop arteriovenous Gore-Tex graft.  . Av fistula placement  2008- 2013    "left upper arm; twice in my neck; left leg; removed from left leg; right upper arm" (11/25/2012)  . Foot amputation through metatarsal Left 06/22/11    "left foot; whole 5th toe" (11/25/2012)  . Pr vein bypass graft,aorto-fem-pop  06/14/2011  . Femoral-popliteal bypass graft  04/11/2012    Procedure: BYPASS GRAFT FEMORAL-POPLITEAL ARTERY;  Surgeon: [name], MD;  Location: [place];  Service: Vascular;  Laterality: Left;  Thrombectomy/Left femoral-popliteal bypass with revision of proximal end and shortening of graft; intraoperative arteriogram; endarterectomy and patch angioplasty with distal anastomosis  . Appendectomy  1960's  . Femoral-popliteal bypass graft  10/09/2012    Procedure: BYPASS GRAFT FEMORAL-POPLITEAL ARTERY;  Surgeon: [name], MD;  Location: [place];  Service: Vascular;  Laterality: Left;  Redo left tibioperoneal trunk bypass with Gortex Graft 36mmx80cm.  . Total abdominal hysterectomy  1960's    one ovary removed  . Kyphoplasty  11/25/2012    Procedure: KYPHOPLASTY;  Surgeon: Kristeen Miss, MD;  Location: [place] [facility];  Service: Neurosurgery;  Laterality: N/A;  Lumbar two lumbar five Kyphoplasty  . Colonoscopy  2014?  Marland Kitchen Cataract extraction, bilateral Bilateral   . Exchange of a  dialysis catheter Right 06/11/2013    Procedure: EXCHANGE OF A DIALYSIS CATHETER;  Surgeon: Conrad [place], MD;  Location: [place];  Service: Vascular;  Laterality: Right;  . Fistulogram Right 06/11/2013    Procedure: Venogram with angioplasty;  Surgeon: Conrad Florala, MD;  Location: [place];  Service: Vascular;  Laterality: Right;  RIGHT CENTRAL VENOGRAM WITH ANGIOPLASTY  . Esophagogastroduodenoscopy N/A 09/10/2013    Procedure: ESOPHAGOGASTRODUODENOSCOPY (EGD);  Surgeon: [name], MD;  Location: [name];  Service: Endoscopy;  Laterality: N/A;  . Eye  surgery        Family History  Problem Relation Age of Onset  . Peripheral vascular disease Mother     amputation  . Hypertension Mother   . Diabetes Mother       reports that she quit smoking about 25 years ago. Her smoking use included Cigarettes. She has a 1.8 pack-year smoking history. She has never used smokeless tobacco. She reports that she does not drink alcohol or use illicit drugs.   Allergies  Allergen Reactions  . Ace Inhibitors Other (See Comments)    Reaction unknown    Prior to Admission medications   Medication Sig Start Date End Date Taking? Authorizing Provider  calcium acetate (PHOSLO) 667 MG capsule Take 2,001 mg by mouth 3 (three) times daily with meals.  09/25/12  Yes Historical Provider, MD  Cinacalcet HCl (SENSIPAR PO) Take 1 tablet by mouth daily.   Yes Historical Provider, MD  cloNIDine (CATAPRES) 0.2 MG tablet Take 0.2 mg by mouth 2 (two) times daily.  03/09/13  Yes Historical Provider, MD  losartan (COZAAR) 100 MG tablet Take 100 mg by mouth every evening.  02/23/13  Yes Historical Provider, MD  metoprolol succinate (TOPROL-XL) 50 MG 24 hr tablet Take 50 mg by mouth daily. Take with or immediately following a meal. 04/15/12  Yes Samantha J Rhyne, PA-C  oxyCODONE-acetaminophen (PERCOCET/ROXICET) 5-325 MG per tablet Take 1 tablet by mouth every 6 (six) hours as needed for moderate pain. 03/09/14   Ulyses Amor, PA-C     Anti-infectives   Start     Dose/Rate Route Frequency Ordered Stop   03/10/14 0800  ceFAZolin (ANCEF) IVPB 1 g/50 mL premix  Status:  Discontinued    Comments:  Send with pt to OR   1 g 100 mL/hr over 30 Minutes Intravenous On call 03/09/14 1159 03/09/14 1421   03/10/14 0800  ceFAZolin (ANCEF) IVPB 2 g/50 mL premix    Comments:  Send with pt to OR   2 g 100 mL/hr over 30 Minutes Intravenous On call 03/09/14 1421 03/11/14 0800   03/08/14 1428  cefUROXime (ZINACEF) 1.5 g in dextrose 5 % 50 mL IVPB     1.5 g 100 mL/hr over 30 Minutes  Intravenous 30 min pre-op 03/08/14 1428 03/09/14 0805      Results for orders placed during the hospital encounter of 03/09/14 (from the past 48 hour(s))  POCT I-STAT 4, (NA,K, GLUC, HGB,HCT)     Status: Abnormal   Collection Time    03/09/14  6:36 AM      Result Value Ref Range   Sodium 135 (*) 137 - 147 mEq/L   Potassium 4.7  3.7 - 5.3 mEq/L   Glucose, Bld 129 (*) 70 - 99 mg/dL   HCT 30.0 (*) 36.0 - 46.0 %   Hemoglobin 10.2 (*) 12.0 - 15.0 g/dL  GLUCOSE, CAPILLARY     Status: Abnormal   Collection Time  03/09/14 10:06 AM      Result Value Ref Range   Glucose-Capillary 107 (*) 70 - 99 mg/dL   Comment 1 Documented in Chart     Comment 2 Notify RN    GLUCOSE, CAPILLARY     Status: None   Collection Time    03/09/14  2:13 PM      Result Value Ref Range   Glucose-Capillary 90  70 - 99 mg/dL     ROS:  See hpi   Physical Exam: Filed Vitals:   03/09/14 1414  BP: 94/51  Pulse:   Temp:   Resp:      General:alert elderly BF pleasant  nad HEENT:  , mmm Eyes: eomi , non icteric Neck: supple , no jvd Heart:  Rrr, 2/6 sem lsb , no rub Lungs:  CTA bilat Abdomen: Obese, soft nt, nd Extremities: trace pedal edema Skin:  No overt rash, warm  Neuro: OX3 no acute focal deficits Dialysis Access: R FEM perm cath  ECHO 2013  LV fxn 50-55%, RV not mentioned, severe TR , moderately ^'d PA pressure ~40  Dialysis: [facility] 3h 45 min   61.5kg    2/2.0 [place]  Heparin 3000   L fem permcath   400/800 Hectorol 3  Epogen 1200   Venofer  0   Assessment/Plan 1. Hemodialysis access malfunction= noted plans for replace Fem perm cath in am 2. ESRD -  MWF ([facility]) tight hep 3. Hypertension/volume  - with hypotension post op ? anesthesia effect / surgery/ hold amlodipine, clonidine/ losartan/ metoprolol meds azx listed  [facility]  4. Anemia  -  esa on hd  Fu am hgb 5. Metabolic bone disease -   Binders / vit d on hd 6. DM type 2 per admit  Ernest Haber, PA-C [place] [phone] 03/09/2014, 3:24 PM   Pt seen, examined and agree w A/P as above. 80 yo ESRD patient with HTN, DM and ESRD admitted for attempt at new "hybrid" dialysis access in R upper arm.  Per op notes the access could not be placed due to poor quality axillary vein.  She will continue to dialyze via HD catheter, which is going to be replaced tomorrow am due to poor recent functioning.  Plan HD tomorrow.  BP's low post op, holding all BP meds.  Will follow   Kelly Splinter MD pager [phone]    cell [phone] 03/09/2014, 7:21 PM

## 2014-03-09 NOTE — Progress Notes (Signed)
Low BPs in left upper arm, unable to take in right arm due to AV fistula.  Pt alert and oriented no c/o of dizziness.  Dr. Doren Custard notified, he requested BP to be checked in L leg.  BP was 91/33.  Dr Doren Custard came by to evaluate pt and stated to monitor and as long as pt remains alert and oriented he was ok with BP.
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
## 2014-03-09 NOTE — Progress Notes (Signed)
   Filed Vitals:   03/09/14 1051 03/09/14 1107 03/09/14 1126 03/09/14 1130  BP: 82/45 94/43 84/35    Pulse: 54 56  56  Temp:      TempSrc:      Resp: 12 16  13   SpO2: 100% 100%  100%   Pt notes BP was low in HD yesterday.  Pt completely asx currently.   After 750 mL albumin, patient's increased to SBP 100s.    Pt also notes she is having flow rate problems with the right femoral tunneled dialysis catheter.   Pt would like the [facility] exchanged.  - Will admit for fluid resuscitation. - Will arrange [facility] exchange for tomorrow  [name], MD [facility] [place] [place] Office: [phone] Pager: [phone]  03/09/2014, 11:45 AM

## 2014-03-09 NOTE — Progress Notes (Signed)
Charge RN Erasmo Downer on 2W fully updated re pt status, BP. Diatek access.

## 2014-03-09 NOTE — Op Note (Addendum)
OPERATIVE NOTE   PROCEDURE: 1. Right axillary exploration 2. Partial excision of old right arteriovenous graft  3. Ligation of right axillary vein  PRE-OPERATIVE DIAGNOSIS: end stage renal disease  POST-OPERATIVE DIAGNOSIS: same as above   SURGEON: [name], MD  ASSISTANT(S): Gerri Lins, PAC   ANESTHESIA: local and MAC  ESTIMATED BLOOD LOSS: 50 cc  FINDING(S): 1. Sclerotic residual axillary vein deep in axilla: too friable for use as access 2. High bifurcation of brachial artery (4 cm distal to axilla)  SPECIMEN(S):  none  INDICATIONS:   [name] is a 80 y.o. female who presents with no further permanent access options except a hybrid arteriovenous graft.  I had concerns that accessing the residual axillary vein would be difficult so I discussed with the patient that completion of this case might not be possible.  Risk, benefits, and alternatives to access surgery were discussed.  The patient is aware the risks include but are not limited to: bleeding, infection, steal syndrome, nerve damage, ischemic monomelic neuropathy, failure to mature, need for additional procedures, death and stroke.  The patient agrees to proceed forward with the procedure.  DESCRIPTION: After obtaining full informed written consent, the patient was brought back to the operating room and placed supine upon the operating table.  The patient received IV antibiotics prior to induction.  After obtaining adequate anesthesia, the patient was prepped and draped in the standard fashion for: right access.  I injected in total 60 cc of a 1:1 mixture of 1% lidocaine with epinephrine and 0.5% Marcaine without epinephrine throughout this case.  I started my injection anesthetic into the axilla.  I made an axillary incision proximal to the prior incisions.  I dissected out an axillary vein behind the median nerve and axillary artery.  The vein appeared sclerotic externally: ranging 4-5 mm in diameter in the  surgically accessible segment.  I felt it was a reasonable vein to attempt a hybrid arteriovenous graft placement.  I then turned my attention to the mid-arm.  Under ultrasound guidance, I marked the location of the brachial artery.  I dissected through the subcutaneous tissue to the radial and ulnar arteries.  I dissected the arteries more proximally until I had control of the brachial artery.  The brachial bifurcation is high in this patient: 4 cm distal to axilla.  At this point, I marked out the path for the new upper arm loop graft.  I injected anesthetic along the path for the new graft.  The old and new graft path crossed, so I had make an incision at the apex of this loop and dissect out the old graft,  I resected ~8 cm of graft to avoid difficulties with the two graft colliding.    At this point, I turned my attention to the right axillary vein.  I cannulated it with the 18 gauge needle and then passed the wire proximally.  I met obstruction at ~10 cm of wire.  Despite multiple manipulations, the wire would not pass into the innominate vein.  I loaded the 5-Fr sheath over the wire into the vein.  At this point, bleeding was developing.  After inspecting the vein, it became evident that the vein was tearing with the limited manipulations.  At this point, I no longer felt this vein was adequate for hybrid arteriovenous graft placement, as a large peel away sheath has to be passed into the vein.  I doubt this vein would be intact after placement of the  peel away sheath, if it was tearing with only a 5-Fr sheath.  I pulled out the sheath and wire and tied off the vein proximal and distal to the cannulation to control the vein tearing, as I did not feel I could repair the tearing due to the extent.  At this point, all wounds were washed out and bleeding point controlled with electrocautery.  I placed thrombin and gelfoam into all incisions.  After waiting a few minutes, I washed out all incisions.  No  further active bleeding was present.  Each incision was repaired with a running 3-0 Vicryl and a running subcuticular stitch of 4-0 Monocryl.  The skin was cleaned, dried, and reinforced with Dermabond.  COMPLICATIONS: none  CONDITION: stable  [name], MD [facility] [place] Office: [phone] Pager: [phone]  03/09/2014, 9:34 AM

## 2014-03-09 NOTE — Preoperative (Signed)
Beta Blockers   Reason not to administer Beta Blockers:Hold beta blocker due to hypotension

## 2014-03-09 NOTE — Progress Notes (Signed)
Dr Ola Spurr here to check on pt. Albumin has been absorbed. SBP 59 to 82. Amalia Hailey notified pt here, procedure/updated. HD access record faxed to [facility].

## 2014-03-09 NOTE — Progress Notes (Signed)
Care of pt assumed by [facility] RN from [facility] [employer] Therapist, sports. Dr Ola Spurr at bedside and aware [phone]. Pt is arousable, no c/o, New ord. For Albumin. Will cont to monitor closely,

## 2014-03-09 NOTE — Progress Notes (Signed)
Rx to adjust antibiotics- Ancef ordered preop am 3/11- Will change to 2gm as per protocol for preop dosing.  Will f/u post op.    Thanks for allowing pharmacy to be a part of this patient's care.  Excell Seltzer, PharmD Clinical Pharmacist, [phone]

## 2014-03-09 NOTE — Transfer of Care (Signed)
Immediate Anesthesia Transfer of Care Note  Patient: [name]  Procedure(s) Performed: Procedure(s): RIGHT AXILLARY EXPLORATION; PARTIAL REMOVOAL OF OLD ARTERIOVENOUS (AV) GORE-TEX GRAFT; LIGATION OF RIGHT AXILLARY VEIN; ULTRASOUND GUIDED (Right)  Patient Location: PACU  Anesthesia Type:MAC  Level of Consciousness: awake, alert , oriented and patient cooperative  Airway & Oxygen Therapy: Patient Spontanous Breathing  Post-op Assessment: Report given to PACU RN, Post -op Vital signs reviewed and stable and Patient moving all extremities  Post vital signs: Reviewed and stable  Complications: No apparent anesthesia complications

## 2014-03-09 NOTE — Anesthesia Preprocedure Evaluation (Addendum)
Anesthesia Evaluation  Patient identified by MRN, date of birth, ID band Patient awake    Reviewed: Allergy & Precautions, H&P , NPO status , Patient's Chart, lab work & pertinent test results, reviewed documented beta blocker date and time   Airway Mallampati: III TM Distance: >3 FB Neck ROM: Full    Dental no notable dental hx. (+) Teeth Intact, Dental Advisory Given   Pulmonary asthma , COPDformer smoker,  breath sounds clear to auscultation  Pulmonary exam normal       Cardiovascular hypertension, Pt. on medications and Pt. on home beta blockers + angina + Peripheral Vascular Disease + dysrhythmias + Valvular Problems/Murmurs Rhythm:Regular Rate:Normal     Neuro/Psych negative neurological ROS  negative psych ROS   GI/Hepatic negative GI ROS, Neg liver ROS,   Endo/Other  diabetes, Type 2, Oral Hypoglycemic Agents  Renal/GU ESRF and DialysisRenal disease  negative genitourinary   Musculoskeletal   Abdominal   Peds  Hematology negative hematology ROS (+) anemia ,   Anesthesia Other Findings   Reproductive/Obstetrics negative OB ROS                        Anesthesia Physical Anesthesia Plan  ASA: IV  Anesthesia Plan: MAC   Post-op Pain Management:    Induction: Intravenous  Airway Management Planned: Simple Face Mask  Additional Equipment:   Intra-op Plan:   Post-operative Plan:   Informed Consent: I have reviewed the patients History and Physical, chart, labs and discussed the procedure including the risks, benefits and alternatives for the proposed anesthesia with the patient or authorized representative who has indicated his/her understanding and acceptance.   Dental advisory given  Plan Discussed with: CRNA and Surgeon  Anesthesia Plan Comments:       Anesthesia Quick Evaluation

## 2014-03-09 NOTE — Progress Notes (Signed)
Amalia Hailey PA updated, aware pt to overnite in hospital, new Diatek to be scheduled for tomorrow as well. Gerri Lins PA her and spoke w/pt

## 2014-03-09 NOTE — Anesthesia Postprocedure Evaluation (Signed)
  Anesthesia Post-op Note  Patient: [name]  Procedure(s) Performed: Procedure(s): RIGHT AXILLARY EXPLORATION; PARTIAL REMOVOAL OF OLD ARTERIOVENOUS (AV) GORE-TEX GRAFT; LIGATION OF RIGHT AXILLARY VEIN; ULTRASOUND GUIDED (Right)  Patient Location: PACU  Anesthesia Type: MAC  Level of Consciousness: awake and alert   Airway and Oxygen Therapy: Patient Spontanous Breathing  Post-op Pain: none  Post-op Assessment: Post-op Vital signs reviewed, Patient's Cardiovascular Status Stable and Respiratory Function Stable  Post-op Vital Signs: Reviewed  Filed Vitals:   03/09/14 1145  BP: 103/33  Pulse: 35  Temp:   Resp: 11    Complications: No apparent anesthesia complications

## 2014-03-09 NOTE — Progress Notes (Signed)
Utilization Review Completed.Donne Anon T3/09/2014

## 2014-03-10 ENCOUNTER — Inpatient Hospital Stay (HOSPITAL_COMMUNITY): Payer: Medicare Other

## 2014-03-10 ENCOUNTER — Telehealth: Payer: Self-pay | Admitting: Vascular Surgery

## 2014-03-10 ENCOUNTER — Inpatient Hospital Stay (HOSPITAL_COMMUNITY): Payer: Medicare Other | Admitting: Certified Registered Nurse Anesthetist

## 2014-03-10 ENCOUNTER — Encounter (HOSPITAL_COMMUNITY): Payer: Self-pay | Admitting: Anesthesiology

## 2014-03-10 ENCOUNTER — Encounter (HOSPITAL_COMMUNITY): Payer: Medicare Other | Admitting: Certified Registered Nurse Anesthetist

## 2014-03-10 ENCOUNTER — Encounter (HOSPITAL_COMMUNITY)
Admission: RE | Disposition: A | Discharge status: Home / Self Care | Payer: Self-pay | Source: Ambulatory Visit | Attending: Vascular Surgery

## 2014-03-10 DIAGNOSIS — E119 Type 2 diabetes mellitus without complications: Secondary | ICD-10-CM | POA: Diagnosis not present

## 2014-03-10 DIAGNOSIS — I10 Essential (primary) hypertension: Secondary | ICD-10-CM | POA: Diagnosis not present

## 2014-03-10 DIAGNOSIS — D631 Anemia in chronic kidney disease: Secondary | ICD-10-CM | POA: Diagnosis not present

## 2014-03-10 DIAGNOSIS — I959 Hypotension, unspecified: Secondary | ICD-10-CM | POA: Diagnosis not present

## 2014-03-10 DIAGNOSIS — Z452 Encounter for adjustment and management of vascular access device: Secondary | ICD-10-CM | POA: Diagnosis not present

## 2014-03-10 DIAGNOSIS — I951 Orthostatic hypotension: Secondary | ICD-10-CM | POA: Diagnosis not present

## 2014-03-10 DIAGNOSIS — T82898A Other specified complication of vascular prosthetic devices, implants and grafts, initial encounter: Secondary | ICD-10-CM | POA: Diagnosis not present

## 2014-03-10 DIAGNOSIS — M549 Dorsalgia, unspecified: Secondary | ICD-10-CM | POA: Diagnosis not present

## 2014-03-10 DIAGNOSIS — K219 Gastro-esophageal reflux disease without esophagitis: Secondary | ICD-10-CM | POA: Diagnosis not present

## 2014-03-10 DIAGNOSIS — T82598A Other mechanical complication of other cardiac and vascular devices and implants, initial encounter: Secondary | ICD-10-CM | POA: Diagnosis not present

## 2014-03-10 DIAGNOSIS — N186 End stage renal disease: Secondary | ICD-10-CM | POA: Diagnosis not present

## 2014-03-10 DIAGNOSIS — R0989 Other specified symptoms and signs involving the circulatory and respiratory systems: Secondary | ICD-10-CM | POA: Diagnosis not present

## 2014-03-10 HISTORY — PX: INSERTION OF DIALYSIS CATHETER: SHX1324

## 2014-03-10 LAB — BASIC METABOLIC PANEL
BUN: 41 mg/dL — AB (ref 6–23)
CO2: 21 mEq/L (ref 19–32)
Calcium: 8.1 mg/dL — ABNORMAL LOW (ref 8.4–10.5)
Chloride: 99 mEq/L (ref 96–112)
Creatinine, Ser: 8.14 mg/dL — ABNORMAL HIGH (ref 0.50–1.10)
GFR calc Af Amer: 5 mL/min — ABNORMAL LOW (ref >90)
GFR, EST NON AFRICAN AMERICAN: 4 mL/min — AB (ref >90)
GLUCOSE: 89 mg/dL (ref 70–99)
Potassium: 5.4 mEq/L — ABNORMAL HIGH (ref 3.7–5.3)
Sodium: 135 mEq/L — ABNORMAL LOW (ref 137–147)

## 2014-03-10 LAB — GLUCOSE, CAPILLARY
GLUCOSE-CAPILLARY: 93 mg/dL (ref 70–99)
Glucose-Capillary: 112 mg/dL — ABNORMAL HIGH (ref 70–99)
Glucose-Capillary: 162 mg/dL — ABNORMAL HIGH (ref 70–99)

## 2014-03-10 LAB — SURGICAL PCR SCREEN
MRSA, PCR: NEGATIVE
Staphylococcus aureus: NEGATIVE

## 2014-03-10 LAB — CBC
HEMATOCRIT: 27.7 % — AB (ref 36.0–46.0)
HEMOGLOBIN: 9 g/dL — AB (ref 12.0–15.0)
MCH: 32.3 pg (ref 26.0–34.0)
MCHC: 32.5 g/dL (ref 30.0–36.0)
MCV: 99.3 fL (ref 78.0–100.0)
Platelets: 118 10*3/uL — ABNORMAL LOW (ref 150–400)
RBC: 2.79 MIL/uL — ABNORMAL LOW (ref 3.87–5.11)
RDW: 13.6 % (ref 11.5–15.5)
WBC: 4.3 10*3/uL (ref 4.0–10.5)

## 2014-03-10 SURGERY — INSERTION OF DIALYSIS CATHETER
Anesthesia: Monitor Anesthesia Care | Site: Chest | Laterality: Right

## 2014-03-10 MED ORDER — OXYCODONE HCL 5 MG/5ML PO SOLN: 5.0000 mg | Freq: Once | ORAL | Status: DC | PRN

## 2014-03-10 MED ORDER — EPHEDRINE SULFATE 50 MG/ML IJ SOLN
INTRAMUSCULAR | Status: AC
  Filled 2014-03-10: qty 1

## 2014-03-10 MED ORDER — MIDAZOLAM HCL 2 MG/2ML IJ SOLN
INTRAMUSCULAR | Status: AC
  Filled 2014-03-10: qty 2

## 2014-03-10 MED ORDER — LIDOCAINE HCL (CARDIAC) 20 MG/ML IV SOLN
INTRAVENOUS | Status: AC
  Filled 2014-03-10: qty 5

## 2014-03-10 MED ORDER — SODIUM CHLORIDE 0.9 % IV SOLN
INTRAVENOUS | Status: DC | PRN
  Administered 2014-03-10: 08:00:00 via INTRAVENOUS

## 2014-03-10 MED ORDER — FENTANYL CITRATE 0.05 MG/ML IJ SOLN
INTRAMUSCULAR | Status: AC
  Filled 2014-03-10: qty 5

## 2014-03-10 MED ORDER — HEPARIN SODIUM (PORCINE) 1000 UNIT/ML IJ SOLN
INTRAMUSCULAR | Status: AC
  Filled 2014-03-10: qty 1

## 2014-03-10 MED ORDER — PHENYLEPHRINE 40 MCG/ML (10ML) SYRINGE FOR IV PUSH (FOR BLOOD PRESSURE SUPPORT)
PREFILLED_SYRINGE | INTRAVENOUS | Status: AC
  Filled 2014-03-10: qty 10

## 2014-03-10 MED ORDER — ONDANSETRON HCL 4 MG/2ML IJ SOLN: 4.0000 mg | Freq: Once | INTRAMUSCULAR | Status: DC | PRN

## 2014-03-10 MED ORDER — DEXTROSE 5 % IV SOLN
10.0000 mg | INTRAVENOUS | Status: DC | PRN
  Administered 2014-03-10: 40 ug/min via INTRAVENOUS

## 2014-03-10 MED ORDER — HEPARIN SODIUM (PORCINE) 5000 UNIT/ML IJ SOLN
INTRAMUSCULAR | Status: DC | PRN
  Administered 2014-03-10: 08:00:00

## 2014-03-10 MED ORDER — PROPOFOL INFUSION 10 MG/ML OPTIME
INTRAVENOUS | Status: DC | PRN
  Administered 2014-03-10: 100 ug/kg/min via INTRAVENOUS

## 2014-03-10 MED ORDER — ACETAMINOPHEN 160 MG/5ML PO SOLN
325.0000 mg | ORAL | Status: DC | PRN
  Filled 2014-03-10: qty 20.3

## 2014-03-10 MED ORDER — OXYCODONE HCL 5 MG PO TABS: 5.0000 mg | ORAL_TABLET | Freq: Once | ORAL | Status: DC | PRN

## 2014-03-10 MED ORDER — PHENYLEPHRINE HCL 10 MG/ML IJ SOLN
INTRAMUSCULAR | Status: DC | PRN
  Administered 2014-03-10: 120 ug via INTRAVENOUS
  Administered 2014-03-10: 160 ug via INTRAVENOUS
  Administered 2014-03-10: 120 ug via INTRAVENOUS
  Administered 2014-03-10 (×3): 160 ug via INTRAVENOUS
  Administered 2014-03-10 (×2): 120 ug via INTRAVENOUS

## 2014-03-10 MED ORDER — 0.9 % SODIUM CHLORIDE (POUR BTL) OPTIME
TOPICAL | Status: DC | PRN
  Administered 2014-03-10: 1000 mL

## 2014-03-10 MED ORDER — ROCURONIUM BROMIDE 50 MG/5ML IV SOLN
INTRAVENOUS | Status: AC
  Filled 2014-03-10: qty 1

## 2014-03-10 MED ORDER — SUCCINYLCHOLINE CHLORIDE 20 MG/ML IJ SOLN
INTRAMUSCULAR | Status: AC
  Filled 2014-03-10: qty 1

## 2014-03-10 MED ORDER — ONDANSETRON HCL 4 MG/2ML IJ SOLN
INTRAMUSCULAR | Status: AC
  Filled 2014-03-10: qty 2

## 2014-03-10 MED ORDER — ESMOLOL HCL 10 MG/ML IV SOLN
INTRAVENOUS | Status: DC | PRN
  Administered 2014-03-10: 5 mg via INTRAVENOUS

## 2014-03-10 MED ORDER — HEPARIN SODIUM (PORCINE) 1000 UNIT/ML IJ SOLN
INTRAMUSCULAR | Status: DC | PRN
  Administered 2014-03-10: 1000 [IU] via INTRAVENOUS

## 2014-03-10 MED ORDER — FENTANYL CITRATE 0.05 MG/ML IJ SOLN: 25.0000 ug | INTRAMUSCULAR | Status: DC | PRN

## 2014-03-10 MED ORDER — PHENYLEPHRINE 40 MCG/ML (10ML) SYRINGE FOR IV PUSH (FOR BLOOD PRESSURE SUPPORT)
PREFILLED_SYRINGE | INTRAVENOUS | Status: AC
Start: 2014-03-10 — End: 2014-03-10
  Filled 2014-03-10: qty 20

## 2014-03-10 MED ORDER — LIDOCAINE-EPINEPHRINE (PF) 1 %-1:200000 IJ SOLN
INTRAMUSCULAR | Status: DC | PRN
  Administered 2014-03-10: 60 mL

## 2014-03-10 MED ORDER — IOHEXOL 300 MG/ML  SOLN
INTRAMUSCULAR | Status: DC | PRN
  Administered 2014-03-10: 50 mL via INTRAVENOUS

## 2014-03-10 MED ORDER — DARBEPOETIN ALFA-POLYSORBATE 100 MCG/0.5ML IJ SOLN
INTRAMUSCULAR | Status: AC
  Filled 2014-03-10: qty 0.5

## 2014-03-10 MED ORDER — DOXERCALCIFEROL 4 MCG/2ML IV SOLN
INTRAVENOUS | Status: AC
  Filled 2014-03-10: qty 2

## 2014-03-10 MED ORDER — ESMOLOL HCL 10 MG/ML IV SOLN
INTRAVENOUS | Status: AC
  Filled 2014-03-10: qty 10

## 2014-03-10 MED ORDER — PROPOFOL 10 MG/ML IV BOLUS
INTRAVENOUS | Status: AC
  Filled 2014-03-10: qty 20

## 2014-03-10 MED ORDER — OXYCODONE-ACETAMINOPHEN 5-325 MG PO TABS
ORAL_TABLET | ORAL | Status: AC
  Filled 2014-03-10: qty 2

## 2014-03-10 MED ORDER — ACETAMINOPHEN 325 MG PO TABS: 325.0000 mg | ORAL_TABLET | ORAL | Status: DC | PRN

## 2014-03-10 SURGICAL SUPPLY — 57 items
ADH SKN CLS APL DERMABOND .7 (GAUZE/BANDAGES/DRESSINGS)
BAG DECANTER FOR FLEXI CONT (MISCELLANEOUS) ×3 IMPLANT
BALLN MUSTANG 8X120X135 (BALLOONS) ×2 IMPLANT
BANDAGE ADHESIVE 1X3 (GAUZE/BANDAGES/DRESSINGS) ×2 IMPLANT
CATH CANNON HEMO 15F 50CM (CATHETERS) ×2 IMPLANT
CATH CANNON HEMO 15FR 19 (HEMODIALYSIS SUPPLIES) IMPLANT
CATH CANNON HEMO 15FR 23CM (HEMODIALYSIS SUPPLIES) ×2 IMPLANT
CATH CANNON HEMO 15FR 31CM (HEMODIALYSIS SUPPLIES) IMPLANT
CATH CANNON HEMO 15FR 32 (HEMODIALYSIS SUPPLIES) IMPLANT
CATH CANNON HEMO 15FR 32CM (HEMODIALYSIS SUPPLIES) IMPLANT
CATH STRAIGHT 5FR 65CM (CATHETERS) IMPLANT
COVER PROBE W GEL 5X96 (DRAPES) ×3 IMPLANT
COVER SURGICAL LIGHT HANDLE (MISCELLANEOUS) ×3 IMPLANT
DERMABOND ADVANCED (GAUZE/BANDAGES/DRESSINGS)
DERMABOND ADVANCED .7 DNX12 (GAUZE/BANDAGES/DRESSINGS) IMPLANT
DRAPE C-ARM 42X72 X-RAY (DRAPES) ×3 IMPLANT
DRAPE CHEST BREAST 15X10 FENES (DRAPES) ×5 IMPLANT
GAUZE SPONGE 2X2 8PLY STRL LF (GAUZE/BANDAGES/DRESSINGS) ×1 IMPLANT
GAUZE SPONGE 4X4 16PLY XRAY LF (GAUZE/BANDAGES/DRESSINGS) ×5 IMPLANT
GLOVE BIO SURGEON STRL SZ7 (GLOVE) ×5 IMPLANT
GLOVE BIOGEL PI IND STRL 6.5 (GLOVE) IMPLANT
GLOVE BIOGEL PI IND STRL 7.5 (GLOVE) ×1 IMPLANT
GLOVE BIOGEL PI INDICATOR 6.5 (GLOVE) ×6
GLOVE BIOGEL PI INDICATOR 7.5 (GLOVE) ×2
GLOVE ECLIPSE 6.5 STRL STRAW (GLOVE) ×4 IMPLANT
GOWN STRL REUS W/ TWL LRG LVL3 (GOWN DISPOSABLE) ×3 IMPLANT
GOWN STRL REUS W/TWL LRG LVL3 (GOWN DISPOSABLE) ×9
GUIDEWIRE AMPLATZ STIFF 0.35 (WIRE) ×2 IMPLANT
KIT BASIN OR (CUSTOM PROCEDURE TRAY) ×3 IMPLANT
KIT ENCORE 26 ADVANTAGE (KITS) ×2 IMPLANT
KIT ROOM TURNOVER OR (KITS) ×3 IMPLANT
NDL 18GX1X1/2 (RX/OR ONLY) (NEEDLE) ×1 IMPLANT
NDL HYPO 25GX1X1/2 BEV (NEEDLE) ×1 IMPLANT
NEEDLE 18GX1X1/2 (RX/OR ONLY) (NEEDLE) ×3 IMPLANT
NEEDLE HYPO 25GX1X1/2 BEV (NEEDLE) ×3 IMPLANT
NS IRRIG 1000ML POUR BTL (IV SOLUTION) ×3 IMPLANT
PACK SURGICAL SETUP 50X90 (CUSTOM PROCEDURE TRAY) ×3 IMPLANT
PAD ARMBOARD 7.5X6 YLW CONV (MISCELLANEOUS) ×6 IMPLANT
SET MICROPUNCTURE 5F STIFF (MISCELLANEOUS) IMPLANT
SHEATH AVANTI 11CM 8FR (MISCELLANEOUS) ×2 IMPLANT
SOAP 2 % CHG 4 OZ (WOUND CARE) ×3 IMPLANT
SPONGE GAUZE 2X2 STER 10/PKG (GAUZE/BANDAGES/DRESSINGS) ×2
SUT ETHILON 3 0 PS 1 (SUTURE) ×3 IMPLANT
SUT MNCRL AB 4-0 PS2 18 (SUTURE) ×3 IMPLANT
SYR 20CC LL (SYRINGE) ×6 IMPLANT
SYR 30ML LL (SYRINGE) IMPLANT
SYR 30ML SLIP (SYRINGE) ×2 IMPLANT
SYR 3ML LL SCALE MARK (SYRINGE) ×3 IMPLANT
SYR 5ML LL (SYRINGE) ×3 IMPLANT
SYR CONTROL 10ML LL (SYRINGE) ×3 IMPLANT
SYRINGE 10CC LL (SYRINGE) ×3 IMPLANT
TOWEL OR 17X24 6PK STRL BLUE (TOWEL DISPOSABLE) ×3 IMPLANT
TOWEL OR 17X26 10 PK STRL BLUE (TOWEL DISPOSABLE) ×3 IMPLANT
WATER STERILE IRR 1000ML POUR (IV SOLUTION) ×3 IMPLANT
WIRE AMPLATZ SS-J .035X180CM (WIRE) IMPLANT
WIRE BENTSON .035X145CM (WIRE) ×2 IMPLANT
WIRE HI TORQ VERSACORE J 260CM (WIRE) ×2 IMPLANT

## 2014-03-10 NOTE — Telephone Encounter (Addendum)
Message copied by Gena Fray on Wed Mar 10, 2014  3:33 PM ------      Message from: Mena Goes      Created: Tue Mar 09, 2014 11:06 AM      Regarding: Schedule                   ----- Message -----         From: Ulyses Amor, PA-C         Sent: 03/09/2014   9:55 AM           To: Vvs Charge Pool            F/U with Der. Chen in 4 weeks s/p right hybrid graft attempt. ------  03/10/14: lm for pt re appt, dpm

## 2014-03-10 NOTE — Clinical Documentation Improvement (Addendum)
  Please specify the TYPE of ANEMIA in the progress notes and discharge summary.   Thank You,   Erling Conte ,RN BSN CCDS Certified Clinical Documentation Specialist:  [phone]  [place] Information Management  Addendum  Discharge summary notes: anemia of chronic illness (ESRD)  [name], MD [facility] [place] Office: [phone] Pager: [phone]  03/10/2014, 2:20 PM

## 2014-03-10 NOTE — Addendum Note (Signed)
Addendum created 03/10/14 1447 by Carney Living, CRNA   Modules edited: Charges VN

## 2014-03-10 NOTE — Interval H&P Note (Signed)
History and Physical Interval Note:  03/10/2014 7:55 AM  [name]  has presented today for surgery, with the diagnosis of Decreased flow through right femoral tunneled dialysis catheter  The various methods of treatment have been discussed with the patient and family. After consideration of risks, benefits and other options for treatment, the patient has consented to  Procedure(s): INSERTION OF TUNNELED  DIALYSIS CATHETER - RIGHT SUBCLAVIAN VERSUS RIGHT FEMORAL TUNNELED DIALYSIS CATHETER EXCHANGE  (Right) as a surgical intervention .  The patient's history has been reviewed, patient examined, no change in status, stable for surgery.  I have reviewed the patient's chart and labs.  Questions were answered to the patient's satisfaction.     [name]

## 2014-03-10 NOTE — Transfer of Care (Signed)
Immediate Anesthesia Transfer of Care Note  Patient: [name]  Procedure(s) Performed: Procedure(s): INSERTION OF TUNNELED  DIALYSIS CATHETER -attempted  RIGHT SUBCLAVIAN, RIGHT FEMORAL TUNNELED DIALYSIS CATHETER EXCHANGE with Inferior Vena Cava gram and Fibrin Sheath Angioplasty (Right)  Patient Location: PACU  Anesthesia Type:MAC  Level of Consciousness: awake, alert , oriented and patient cooperative  Airway & Oxygen Therapy: Patient Spontanous Breathing and Patient connected to nasal cannula oxygen  Post-op Assessment: Report given to PACU RN, Post -op Vital signs reviewed and stable and Patient moving all extremities X 4  Post vital signs: Reviewed and stable  Complications: No apparent anesthesia complications

## 2014-03-10 NOTE — Progress Notes (Signed)
Called by RN at 2111 stating that the incision from the old HD cath had bleed and saturated the dressing. When the dressing was removed there was oozing and an additional hematoma with bruising was noted at the new insertion site. Hematoma around new insertion site is still soft with minimal bruising. Advised bedside RN to  apply a pressure dressing to the site, update the MD, and continue to monitor. Patient's BP low and IVF started per MD's order. Patient states she has low blood pressures after HD. Patient alert, oriented, skin warm and dry.

## 2014-03-10 NOTE — Progress Notes (Signed)
Subjective:  On hd using replaced R Fem perm cath  Objective Vital signs in last 24 hours: Filed Vitals:   03/10/14 1107 03/10/14 1115 03/10/14 1121 03/10/14 1122  BP: 114/24   124/42  Pulse: 56 60 58   Temp:      TempSrc:      Resp: 12 14 13    SpO2: 100% 100% 100%    General on hd :alert elderly BF pleasant nad  Heart: Rrr, 2/6 sem lsb , no rub  Lungs: CTA bilat  Abdomen: Obese, soft nt, nd  Extremities: trace pedal edema  Dialysis Access: R FEM perm cath   ECHO 2013 LV fxn 50-55%, RV not mentioned, severe TR , moderately ^'d PA pressure ~40   Dialysis: [facility]  3h 45 min 61.5kg 2/2.0 Bath Heparin 3000 L fem permcath 400/800  Hectorol 3 Epogen 1200 Venofer 0   Assessment/Plan  1. Hemodialysis access malfunction= sp  Replaced R Fem perm cath  With fibrin sheath removed by Dr. Bridgett Larsson noted unable to place R Subclav perm cath today 2. ESRD - MWF ([facility]) tight hep 3. Hypertension/volume -  yesterdaywith hypotension post op ? anesthesia effect / yesterday IV fluids given with bp stable now/  Holding  amlodipine, clonidine/ losartan/ metoprolol meds pending bp improvement  4. Anemia - esa on hd/ hgb=10.2> 8.6,> 9.0 5. Metabolic bone disease - Binders / vit d on hd 6. DM type 2 per admit abs: Basic Metabolic Panel:  Recent Labs Lab 03/09/14 0636 03/09/14 1530 03/10/14 0500  NA 135* 135* 135*  K 4.7 5.5* 5.4*  CL  --  98 99  CO2  --  21 21  GLUCOSE 129* 140* 89  BUN  --  31* 41*  CREATININE  --  7.23* 8.14*  CALCIUM  --  8.4 8.1*   Liver Function Tests:  Recent Labs Lab 03/09/14 1530  AST 13  ALT 13  ALKPHOS 86  BILITOT 0.3  PROT 7.2  ALBUMIN 4.0  CBC:  Recent Labs Lab 03/09/14 0636 03/09/14 1530 03/10/14 0500  WBC  --  5.8 4.3  HGB 10.2* 8.6* 9.0*  HCT 30.0* 26.5* 27.7*  MCV  --  98.9 99.3  PLT  --  126* 118*  CBG:  Recent Labs Lab 03/09/14 1006 03/09/14 1413 03/09/14 1627 03/10/14 0658 03/10/14 1025  GLUCAP 107* 90 148* 93 112*     Studies/Results: Dg Chest 2 View  03/09/2014   CLINICAL DATA:  Preoperative radiograph, dialysis.  EXAM: CHEST  2 VIEW  COMPARISON:  08/13/2013  FINDINGS: Aortic atherosclerosis. Heart size upper normal. Mild linear retrocardiac opacity is similar to prior. No new consolidation. No pleural effusion or pneumothorax. Osteopenia. Bilateral shoulder glenohumeral and acromioclavicular DJD. Sequelae of vertebroplasty and upper lumbar vertebral body. Multilevel degenerative changes and flowing anterior osteophytes. Lower approach vas cath tip projecting over the mid SVC.  IMPRESSION: Mild retrocardiac opacity, favor atelectasis or scarring.  Vas-Cath tip projects over the mid SVC.   Electronically Signed   By: Carlos Levering M.D.   On: 03/09/2014 06:27   Medications: . sodium chloride 50 mL/hr (03/10/14 1120)   . [facility] HOLD] calcium acetate  2,001 mg Oral TID WC  . [facility] HOLD] cinacalcet  30 mg Oral Q breakfast  . [MAR HOLD] cloNIDine  0.2 mg Oral BID  . [MAR HOLD] darbepoetin (ARANESP) injection - DIALYSIS  100 mcg Intravenous Q Wed-HD  . [facility] HOLD] doxercalciferol  3 mcg Intravenous Q M,W,F-HD  . [MAR HOLD] losartan  100 mg Oral QPM  . [MAR HOLD] metoprolol succinate  50 mg Oral Daily  . [MAR HOLD] pantoprazole  40 mg Oral Daily  . [MAR HOLD] potassium chloride  20-40 mEq Oral Once    Ernest Haber, PA-C [place] [phone] 03/10/2014,11:25 AM  LOS: 1 day   Pt seen, examined and agree w A/P as above.  Kelly Splinter MD pager [phone]    cell [phone] 03/10/2014, 2:23 PM

## 2014-03-10 NOTE — Progress Notes (Signed)
Continual seepage from right  femoral site near where catheter removed. Paged vascular surgeon on call.. Dr. Kellie Simmering responded timely advised to continue to hold pressure for 15- 20 minutes. Removed old dressing applied make shift dressing reported to floor nurse to continue to monitor the site for bleeding.

## 2014-03-10 NOTE — Progress Notes (Signed)
Called by bedside RN around [phone] regarding oozing and hematoma from HD site. Small area of drainage on dressing applied in dialysis. Hematoma noted above new HD cath towards groin, no bruising noted, hematoma is still soft. Advised bedside RN to continue to monitor site and RT will follow up. Advised bedside RN to call with further needs

## 2014-03-10 NOTE — Anesthesia Preprocedure Evaluation (Addendum)
Anesthesia Evaluation  Patient identified by MRN, date of birth, ID band Patient awake    Reviewed: Allergy & Precautions, H&P , NPO status , Patient's Chart, lab work & pertinent test results, reviewed documented beta blocker date and time   History of Anesthesia Complications Negative for: history of anesthetic complications  Airway Mallampati: II TM Distance: >3 FB Neck ROM: Full    Dental  (+) Chipped, Teeth Intact, Dental Advisory Given   Pulmonary shortness of breath and with exertion, COPD COPD inhaler, former smoker,  breath sounds clear to auscultation        Cardiovascular hypertension, Pt. on medications and Pt. on home beta blockers + angina with exertion + Peripheral Vascular Disease - Past MI and - CHF Rhythm:Regular Rate:Normal     Neuro/Psych negative neurological ROS  negative psych ROS   GI/Hepatic Neg liver ROS, GERD-  Controlled and Medicated,  Endo/Other  diabetes, Type 2  Renal/GU ESRF and DialysisRenal disease     Musculoskeletal   Abdominal   Peds  Hematology  (+) anemia ,   Anesthesia Other Findings   Reproductive/Obstetrics                         Anesthesia Physical Anesthesia Plan  ASA: III  Anesthesia Plan: MAC   Post-op Pain Management:    Induction: Intravenous  Airway Management Planned: Natural Airway  Additional Equipment: None  Intra-op Plan:   Post-operative Plan:   Informed Consent: I have reviewed the patients History and Physical, chart, labs and discussed the procedure including the risks, benefits and alternatives for the proposed anesthesia with the patient or authorized representative who has indicated his/her understanding and acceptance.   Dental advisory given  Plan Discussed with: CRNA, Surgeon and Anesthesiologist  Anesthesia Plan Comments:        Anesthesia Quick Evaluation

## 2014-03-10 NOTE — Discharge Summary (Addendum)
[facility] Discharge Summary  [name] 1934/10/05 80 y.o. female  [id number]  Admission Date: 03/09/2014  Discharge Date: 03/10/14  Physician: Conrad Merwin, MD  Admission Diagnosis: End Stage Renal Disease Decreased flow through right femoral tunneled dialysis catheter   HPI:   This is a 80 y.o. female who presents with chief complaint: ESRD. The patient presents today for attempt at hybrid AVG placement.  Hospital Course:  The patient was admitted to the hospital and taken to the operating room on 03/09/2014 and underwent: Right axillary exploration Partial excision of old right arteriovenous graft Ligation of right axillary vein.  She was found to have a sclerotic residual axillary vein deep in axilla, which was too friable for use as access.  She was also found to have a high bifurcation of the brachial artery (4cm distal to axilla).  She was admitted post operatively for fluid resuscitation and exchange of diatek the next day.     On 03/10/14, she was taken to the operating room and underwent: 1. Aborted right subclavian vein tunneled dialysis catheter placement  2. Inferior vena cavagram  3. Fibrin sheath angioplasty (8 mm x 120 mm)  4. Left femoral tunneled dialysis catheter exchange  She underwent HD that afternoon and is discharged after HD.  The remainder of the hospital course consisted of increasing mobilization and increasing intake of solids without difficulty.  CBC    Component Value Date/Time   WBC 4.3 03/10/2014 0500   WBC 6.2 11/06/2006 0920   RBC 2.79* 03/10/2014 0500   RBC 2.91* 06/19/2010 1246   RBC 4.25 11/06/2006 0920   HGB 9.0* 03/10/2014 0500   HGB 12.2 11/06/2006 0920   HCT 27.7* 03/10/2014 0500   HCT 37.7 11/06/2006 0920   PLT 118* 03/10/2014 0500   PLT 375 11/06/2006 0920   MCV 99.3 03/10/2014 0500   MCV 88.7 11/06/2006 0920   MCH 32.3 03/10/2014 0500   MCH 28.7 11/06/2006 0920   MCHC 32.5 03/10/2014 0500   MCHC 32.4 11/06/2006  0920   RDW 13.6 03/10/2014 0500   RDW 15.5* 11/06/2006 0920   LYMPHSABS 0.5* 06/09/2013 2235   LYMPHSABS 1.5 11/06/2006 0920   MONOABS 0.7 06/09/2013 2235   MONOABS 0.5 11/06/2006 0920   EOSABS 0.0 06/09/2013 2235   EOSABS 0.2 11/06/2006 0920   BASOSABS 0.0 06/09/2013 2235   BASOSABS 0.0 11/06/2006 0920    BMET    Component Value Date/Time   NA 135* 03/10/2014 0500   K 5.4* 03/10/2014 0500   CL 99 03/10/2014 0500   CO2 21 03/10/2014 0500   GLUCOSE 89 03/10/2014 0500   BUN 41* 03/10/2014 0500   CREATININE 8.14* 03/10/2014 0500   CALCIUM 8.1* 03/10/2014 0500   GFRNONAA 4* 03/10/2014 0500   GFRAA 5* 03/10/2014 0500     Discharge Instructions:   The patient is discharged to home with extensive instructions on wound care and progressive ambulation.  They are instructed not to drive or perform any heavy lifting until returning to see the physician in his office.  Discharge Orders   Future Appointments Provider Department Dept Phone   04/16/2014 9:00 AM Conrad Troy, MD [facility] [phone]   Future Orders Complete By Expires   Call MD for:  redness, tenderness, or signs of infection (pain, swelling, bleeding, redness, odor or green/yellow discharge around incision site)  As directed    Call MD for:  severe or increased pain, loss or decreased feeling  in affected limb(s)  As directed    Call MD for:  temperature >100.5  As directed    Discharge instructions  As directed    Comments:     Keep right arm clean and dry for 24 hours then you may wash your arm.   Driving Restrictions  As directed    Comments:     No driving for 24 hours   Increase activity slowly  As directed    Comments:     Walk with assistance use walker or cane as needed   Lifting restrictions  As directed    Comments:     No lifting for 6 weeks   Resume previous diet  As directed       Discharge Diagnosis:  End Stage Renal Disease Decreased flow through right femoral tunneled dialysis  catheter  Secondary Diagnosis: Patient Active Problem List   Diagnosis Date Noted  . Nausea and vomiting 06/10/2013  . Anemia 06/10/2013  . Hiccups 06/10/2013  . Unresponsive episode 06/10/2013  . ESRD (end stage renal disease) 06/09/2013  . Arteriosclerosis, mesenteric artery 03/08/2013  . Enteritis 03/08/2013  . Nonocclusive mesenteric ischemia 03/07/2013  . S/P lumbar spine operation 11/25/2012  . PVD (peripheral vascular disease) 11/04/2012  . PVD (peripheral vascular disease) with claudication 04/29/2012  . Chest pain 04/24/2012  . Tachycardia 04/16/2012  . Atherosclerosis of native arteries of the extremities with intermittent claudication 04/10/2012  . Peripheral vascular disease, unspecified 09/19/2011  . Aftercare following surgery of the circulatory system, [place] 09/19/2011  . Foot pain 04/05/2011  . Mitral valve disorders 03/28/2010  . PALPITATIONS 02/14/2010  . CHEST PAIN-PRECORDIAL 02/14/2010  . DIABETES MELLITUS, TYPE II 02/13/2010  . GOUT 02/13/2010  . HYPERKALEMIA 02/13/2010  . HYPERTENSION 02/13/2010  . BRADYCARDIA 02/13/2010  . COPD 02/13/2010  . RENAL FAILURE, END STAGE 02/13/2010  . WALKING DIFFICULTY 02/13/2010   Past Medical History  Diagnosis Date  . Hypertension   . Gout, unspecified 1980's    "not anymore" (03/09/2014)  . Mild mitral valve stenosis   . Gangrene     left fifth toe  . Peripheral vascular disease   . Hyperlipidemia   . Aortic valve sclerosis   . Shoulder fracture, right 2012  . Blood transfusion 1960's  . Chronic back pain   . Other specified cardiac dysrhythmias(427.89)     sees Dr. Angelena Form  . Heart murmur     "I think so" (11/25/2012)  . Anginal pain   . Anemia   . Exertional shortness of breath   . Asthma     "used to; not anymore" (03/09/2014)  . Pneumonia     "once; years ago" (03/09/2014)  . Type II diabetes mellitus     "not on any medication at this time" (03/09/2014)  . GERD (gastroesophageal reflux disease)      hx (03/09/2014)  . Arthritis     "all over" (03/09/2014)  . ESRD (end stage renal disease) on dialysis     M, W, [facility] [place] [facility] (03/09/2014)  Pt's anemia is anemia of chronic illness (ESRD)     Medication List         calcium acetate 667 MG capsule  Commonly known as:  PHOSLO  Take 2,001 mg by mouth 3 (three) times daily with meals.     cloNIDine 0.2 MG tablet  Commonly known as:  CATAPRES  Take 0.2 mg by mouth 2 (two) times daily.     losartan 100 MG tablet  Commonly known  as:  COZAAR  Take 100 mg by mouth every evening.     metoprolol succinate 50 MG 24 hr tablet  Commonly known as:  TOPROL-XL  Take 50 mg by mouth daily. Take with or immediately following a meal.     oxyCODONE-acetaminophen 5-325 MG per tablet  Commonly known as:  PERCOCET/ROXICET  Take 1 tablet by mouth every 6 (six) hours as needed for moderate pain.     SENSIPAR PO  Take 1 tablet by mouth daily.        Percocet given  Disposition: home  Patient's condition: is Good  Follow up: 1. Dr. Bridgett Larsson in 4 weeks   Leontine Locket, PA-C [facility] [phone] 03/10/2014  2:05 PM  Addendum  I have independently interviewed and examined the patient, and I agree with the physician assistant's discharge summary.  This patient underwent an attempt at right upper arm hybrid AVG placement.  Unfortunately her right axillary vein was not adequate for that purpose, so this patient is essential without further permanent access options at this point.  The patient was admitted overnight for hypotension that responded to fluid resuscitation.  She was having difficulty obtaining adequate flow rate via her right femoral tunneled dialysis catheter so I agreed to try to place a right subclavian vein tunneled dialysis catheter.  Unfortunately, she had thrombus within her right subclavian vein upon aspiration, so I abort that placement and exchanged the right femoral tunneled dialysis  catheter.  I performed a fibrin sheath angioplasty to improve flow rates in the right atrium.  The patient will follow up in 4 weeks for wound check.  [name], MD [facility] [place] Office: [phone] Pager: [phone]  03/10/2014, 2:12 PM

## 2014-03-10 NOTE — Anesthesia Postprocedure Evaluation (Signed)
  Anesthesia Post-op Note  Patient: [name]  Procedure(s) Performed: Procedure(s): INSERTION OF TUNNELED  DIALYSIS CATHETER -attempted  RIGHT SUBCLAVIAN, RIGHT FEMORAL TUNNELED DIALYSIS CATHETER EXCHANGE with Inferior Vena Cava gram and Fibrin Sheath Angioplasty (Right)  Patient Location: PACU  Anesthesia Type:MAC  Level of Consciousness: awake, alert  and oriented  Airway and Oxygen Therapy: Patient Spontanous Breathing  Post-op Pain: mild  Post-op Assessment: Post-op Vital signs reviewed, Patient's Cardiovascular Status Stable, Respiratory Function Stable, Patent Airway, No signs of Nausea or vomiting, Adequate PO intake and Pain level controlled  Post-op Vital Signs: Reviewed and stable  Complications: No apparent anesthesia complications

## 2014-03-10 NOTE — Op Note (Signed)
OPERATIVE NOTE  PROCEDURE: 1.  Aborted right subclavian vein tunneled dialysis catheter placement 2.  Inferior vena cavagram 3.  Fibrin sheath angioplasty (8 mm x 120 mm) 4.  Left femoral tunneled dialysis catheter exchange  PRE-OPERATIVE DIAGNOSIS: end-stage renal failure  POST-OPERATIVE DIAGNOSIS: same as above  SURGEON: [name], MD  ANESTHESIA: local and MAC  ESTIMATED BLOOD LOSS: 30 cc  FINDING(S): 1.  Clot in the right subclavian vein upon aspiration 2.  No evidence of pneumothorax on fluoroscopy 3.  Fibrin sheath extending from external iliac vein to proximal inferior vena cava: disrupted after angioplasty 4.  Tips of the catheter in the right atrium on fluoroscopy  SPECIMEN(S):  none  INDICATIONS:   [name] is a 80 y.o. female who presents with end access.  The patient presents for possible right subclavian vein tunneled dialysis catheter placement vs. Left femoral tunneled dialysis catheter exchange.  The patient presents for femoral tunneled dialysis catheter exchange.  The patient is aware the risks of tunneled dialysis catheter exchange include but are not limited to: bleeding, infection, central venous injury, possible venous stenosis, possible malpositioning in the venous system, and possible infections related to long-term catheter presence.  The patient was aware of these risks and agreed to proceed.  DESCRIPTION: After written full informed consent was obtained from the patient, the patient was taken back to the operating room.  Prior to induction, the patient was given IV antibiotics.  After obtaining adequate sedation, the patient was prepped and draped for a right subclavian vein tunneled dialysis catheter placement.  In  total I injected 45 cc of a 1% Lidocaine with epinepherine, but started with injecting 5 cc at right chest exit site.  I made 3 attempts to cannulate the right subclavian vein.  On two passes, I aspirated clot from the subclavian  vein.  Subsequently, I felt that a subclavian vein tunneled dialysis catheter was unlikely to be possible.  At this point, the patient was prepped and draped in the standard fashion for a femoral vein tunneled dialysis catheter exchange.  I anesthesized the subctuaneous tissue surrounding the tunneled dialysis catheter cuff with local anesthetic.  I then dissected out the cuff bluntly and sharply released the cuff.  I then anesthesized the skin above the catheter at the level of the inguinal crease.  I made an incision here and dissected out the catheter bluntly.  I clamped the catheter and sharply transected the distal portion of the catheter.  This distal portion was pushed out of the surgical field to prevent contamination of the field.  I then clamped one lumen of the catheter with a hemostat and then loaded an Versacore wire through the remaining lumen.  Under fluoroscopy, the wire was advanced into the right atrium.  The catheter was removed over the wire.  I then placed a 8 Fr long sheath into the right external iliac vein.  The vein aspirated without any difficulties.  I then did hand injections to image a fibrin sheath in the iliac venous system extending into the inferior vena cava.  A 8 mm x 120 mm balloon was placed over the wire and inflated from the level of the inferior vena cava down to the the level of the external iliac vein to disrupt the fibrin sheath.  Hand injections verified this.  I then exchanged the sheath for the new dilator sheath over the wire.  The dilator was removed and a new 55-cm Diatek catheter was woven over the  wire and advanced through the sheath into the right atrium, under fluoroscopic guidance.  I peeled away the sheath while hold the cuff of the catheter at skin level.  The wire was then removed under fluoroscopic guidance.  I then determined the new exit site for the new catheter and anesthesized this site and the subcutaneous tunnel leading to the prior femoral vein  cannulation site.  I dissected from the exit site to the cannulation site.  The subcutaneous tunnel was dilated by passing a plastic dilator over the metal tunneler.  The metal dissector was connected to the back end of this catheter and the catheter was delivery through the subcutaneous tunnel.  The catheter hub loaded onto the catheter.  The back end of this catheter was transected, revealing the two lumens of this catheter.  The ports were docked onto these two lumens.  The catheter hub was then screwed into place.  Each port was tested by aspirating and flushing.  No resistance was noted.  Each port was then thoroughly flushed with heparinized saline.  I reimaged the catheter and it appeared to be coiling in the right atrium.  So I placed an Amplatz wire through each port and repositioned the catheter.  I did a hand injection via the longer port and this demonstrated the longer port was in the superior vena cava, so I hand to make another exit incision further down the leg to pull the catheter more distal.  I removed the wire and reflushed both ports.  I removed the back end of the catheter and removed the catheter hub with catheter sleeve.  I then tunneled with a hemostat from the new exit site.  I clamped the two lumen of the catheter with hemostat to avoid getting any fat in the catheter tip.  I pull the catheter through the new exit tunnel.  The catheter hub and catheter sleeve were replaced and I redocked the two ports.  The catheter hub was screwed into place.  I pulled the catheter down and on the completion imaging.  Both catheter tips were in the right atrium.  I aspirated 5 cc of blood off both catheter ports and re-tested both ports.  Each ports was flushed with heparinized saline.  The catheter was secured in placed with two interrupted stitches of 3-0 Nylon tied to the catheter.  The groin incision and interval exit incision were closed with a U-stitch of 4-0 Monocryl.  The exit site and cannulation  incision were cleaned and sterile bandages applied.  Each port was then loaded with concentrated heparin (1000 Units/mL) at the manufacturer recommended volumes to each port.  Sterile caps were applied to each port.  On completion fluoroscopy, the tips of the catheter were in the right atrium, and there was no evidence of pneumothorax.  COMPLICATIONS: none  CONDITION: stable  [name], MD [facility] [place] Office: [phone] Pager: [phone]  03/10/2014, 10:08 AM

## 2014-03-10 NOTE — Preoperative (Signed)
Beta Blockers   Reason not to administer Beta Blockers:Not Applicable, Will give IV beta blocker intra-op

## 2014-03-11 ENCOUNTER — Encounter (HOSPITAL_COMMUNITY): Payer: Self-pay | Admitting: Vascular Surgery

## 2014-03-11 DIAGNOSIS — D631 Anemia in chronic kidney disease: Secondary | ICD-10-CM | POA: Diagnosis not present

## 2014-03-11 DIAGNOSIS — T82898A Other specified complication of vascular prosthetic devices, implants and grafts, initial encounter: Secondary | ICD-10-CM | POA: Diagnosis not present

## 2014-03-11 DIAGNOSIS — I951 Orthostatic hypotension: Secondary | ICD-10-CM | POA: Diagnosis not present

## 2014-03-11 DIAGNOSIS — N186 End stage renal disease: Secondary | ICD-10-CM | POA: Diagnosis not present

## 2014-03-11 LAB — GLUCOSE, CAPILLARY
GLUCOSE-CAPILLARY: 119 mg/dL — AB (ref 70–99)
Glucose-Capillary: 132 mg/dL — ABNORMAL HIGH (ref 70–99)
Glucose-Capillary: 110 mg/dL — ABNORMAL HIGH (ref 70–99)

## 2014-03-11 MED ORDER — ONDANSETRON HCL 4 MG PO TABS: 4.0000 mg | ORAL_TABLET | Freq: Three times a day (TID) | ORAL | Status: DC | PRN

## 2014-03-11 NOTE — Progress Notes (Addendum)
   Daily Progress Note  Assessment/Planning: POD #1 s/p R fibrin sheath angioplasty, R fem TDC exchange   Pt's D/C delayed due to late hemodialysis session  No further bleeding  Ok to D/C  Subjective  - 1 Day Post-Op  Notes bleeding from femoral TDC site  Objective Filed Vitals:   03/10/14 2104 03/10/14 2106 03/10/14 2336 03/11/14 0447  BP: 105/74 94/50 118/48 110/79  Pulse:   98 82  Temp:    98.3 F (36.8 C)  TempSrc:    Oral  Resp:   18 19  Weight:      SpO2:   100% 99%    Intake/Output Summary (Last 24 hours) at 03/11/14 1011 Last data filed at 03/11/14 0600  Gross per 24 hour  Intake   1200 ml  Output   1946 ml  Net   -746 ml    PULM  CTAB CV  RRR GI  soft, NTND VASC  R axilla and upper arm incision c/d/i, no hematoma in R axilla; R groin: no hematoma palpable, no bleeding present from any incision including TDC exit  Laboratory CBC    Component Value Date/Time   WBC 4.3 03/10/2014 0500   WBC 6.2 11/06/2006 0920   HGB 9.0* 03/10/2014 0500   HGB 12.2 11/06/2006 0920   HCT 27.7* 03/10/2014 0500   HCT 37.7 11/06/2006 0920   PLT 118* 03/10/2014 0500   PLT 375 11/06/2006 0920    BMET    Component Value Date/Time   NA 135* 03/10/2014 0500   K 5.4* 03/10/2014 0500   CL 99 03/10/2014 0500   CO2 21 03/10/2014 0500   GLUCOSE 89 03/10/2014 0500   BUN 41* 03/10/2014 0500   CREATININE 8.14* 03/10/2014 0500   CALCIUM 8.1* 03/10/2014 0500   GFRNONAA 4* 03/10/2014 0500   GFRAA 5* 03/10/2014 0500    [name], MD [facility] [place]: [phone] Pager: [phone]  03/11/2014, 10:11 AM

## 2014-03-11 NOTE — Progress Notes (Signed)
  [facility] Progress Note   Subjective: No complaints, had bleeding at cath site peri-HD yest and again overnight. None now, dressing redone by Vascular.   Filed Vitals:   03/10/14 2104 03/10/14 2106 03/10/14 2336 03/11/14 0447  BP: 105/74 94/50 118/48 110/79  Pulse:   98 82  Temp:    98.3 F (36.8 C)  TempSrc:    Oral  Resp:   18 19  Weight:      SpO2:   100% 99%   Exam: No distress, calm and alert No jvd Clear bilat RRR no MRG Abd soft nt, nd No LE edema R fem Diatek cath in place, no hematoma  ECHO 2013 LV fxn 50-55%, RV not mentioned, severe TR , ^PA pressure ~40    Dialysis: [facility]  3h 45 min 61.5kg 2/2.0 [place] Heparin 3000 L fem permcath 400/800  Hectorol 3 Epogen 1200 Venofer 0    Assessment/Plan: 1 HD access difficulty- unable to place RUA hybrid graft due to friable ax vein; R fem cath replaced due to poor fxn 2 ESRD on hemodialysis 3 HTN/vol- BP's soft, holding BP meds, no vol excess 4 DM2 5 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 6 Dispo- stable from renal standpoint for d/c    Kelly Splinter MD  pager [phone]    cell [phone]  03/11/2014, 9:28 AM     Recent Labs Lab 03/09/14 0636 03/09/14 1530 03/10/14 0500  NA 135* 135* 135*  K 4.7 5.5* 5.4*  CL  --  98 99  CO2  --  21 21  GLUCOSE 129* 140* 89  BUN  --  31* 41*  CREATININE  --  7.23* 8.14*  CALCIUM  --  8.4 8.1*    Recent Labs Lab 03/09/14 1530  AST 13  ALT 13  ALKPHOS 86  BILITOT 0.3  PROT 7.2  ALBUMIN 4.0    Recent Labs Lab 03/09/14 0636 03/09/14 1530 03/10/14 0500  WBC  --  5.8 4.3  HGB 10.2* 8.6* 9.0*  HCT 30.0* 26.5* 27.7*  MCV  --  98.9 99.3  PLT  --  126* 118*   . calcium acetate  2,001 mg Oral TID WC  . cinacalcet  30 mg Oral Q breakfast  . cloNIDine  0.2 mg Oral BID  . darbepoetin (ARANESP) injection - DIALYSIS  100 mcg Intravenous Q Wed-HD  . doxercalciferol  3 mcg Intravenous Q M,W,F-HD  . losartan  100 mg Oral QPM  . metoprolol succinate  50 mg Oral  Daily  . pantoprazole  40 mg Oral Daily  . potassium chloride  20-40 mEq Oral Once   . sodium chloride 50 mL/hr at 03/10/14 2118   acetaminophen, acetaminophen, guaiFENesin-dextromethorphan, hydrALAZINE, labetalol, metoprolol, morphine injection, ondansetron, oxyCODONE-acetaminophen, phenol

## 2014-03-11 NOTE — Progress Notes (Signed)
Asses. right groin . Mod. Amt of bleeding via small incision rt thigh. Small hematoma with a bruise .   Pressure held 10 mins and stop then oozed around new cath. Site small amt. R.N. Aware doppler ppp. Rt groin very tender to touch. See v.s. Flow sheet. Rapid response call and into see patient. And advise to use pressure dressing.   Dillon Mcreynolds, Leron Croak

## 2014-03-23 ENCOUNTER — Ambulatory Visit: Payer: Medicare Other | Admitting: Family

## 2014-03-23 ENCOUNTER — Encounter (HOSPITAL_COMMUNITY): Payer: Medicare Other

## 2014-03-23 ENCOUNTER — Other Ambulatory Visit (HOSPITAL_COMMUNITY): Payer: Medicare Other

## 2014-03-30 DIAGNOSIS — N186 End stage renal disease: Secondary | ICD-10-CM | POA: Diagnosis not present

## 2014-03-31 DIAGNOSIS — D509 Iron deficiency anemia, unspecified: Secondary | ICD-10-CM | POA: Diagnosis not present

## 2014-03-31 DIAGNOSIS — D631 Anemia in chronic kidney disease: Secondary | ICD-10-CM | POA: Diagnosis not present

## 2014-03-31 DIAGNOSIS — N2581 Secondary hyperparathyroidism of renal origin: Secondary | ICD-10-CM | POA: Diagnosis not present

## 2014-03-31 DIAGNOSIS — N186 End stage renal disease: Secondary | ICD-10-CM | POA: Diagnosis not present

## 2014-03-31 DIAGNOSIS — E785 Hyperlipidemia, unspecified: Secondary | ICD-10-CM | POA: Diagnosis not present

## 2014-03-31 DIAGNOSIS — E1129 Type 2 diabetes mellitus with other diabetic kidney complication: Secondary | ICD-10-CM | POA: Diagnosis not present

## 2014-04-15 ENCOUNTER — Encounter: Payer: Self-pay | Admitting: Vascular Surgery

## 2014-04-16 ENCOUNTER — Ambulatory Visit (INDEPENDENT_AMBULATORY_CARE_PROVIDER_SITE_OTHER): Payer: Self-pay | Admitting: Vascular Surgery

## 2014-04-16 ENCOUNTER — Encounter: Payer: Medicare Other | Admitting: Vascular Surgery

## 2014-04-16 ENCOUNTER — Encounter: Payer: Self-pay | Admitting: Vascular Surgery

## 2014-04-16 VITALS — BP 145/83 | HR 76 | Ht <= 58 in | Wt 135.0 lb

## 2014-04-16 DIAGNOSIS — N186 End stage renal disease: Secondary | ICD-10-CM
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
## 2014-04-21 DIAGNOSIS — E1129 Type 2 diabetes mellitus with other diabetic kidney complication: Secondary | ICD-10-CM | POA: Diagnosis not present

## 2014-04-29 DIAGNOSIS — N186 End stage renal disease: Secondary | ICD-10-CM | POA: Diagnosis not present

## 2014-04-30 DIAGNOSIS — D631 Anemia in chronic kidney disease: Secondary | ICD-10-CM | POA: Diagnosis not present

## 2014-04-30 DIAGNOSIS — D509 Iron deficiency anemia, unspecified: Secondary | ICD-10-CM | POA: Diagnosis not present

## 2014-04-30 DIAGNOSIS — E1129 Type 2 diabetes mellitus with other diabetic kidney complication: Secondary | ICD-10-CM | POA: Diagnosis not present

## 2014-04-30 DIAGNOSIS — E8779 Other fluid overload: Secondary | ICD-10-CM | POA: Diagnosis not present

## 2014-04-30 DIAGNOSIS — N186 End stage renal disease: Secondary | ICD-10-CM | POA: Diagnosis not present

## 2014-04-30 DIAGNOSIS — N2581 Secondary hyperparathyroidism of renal origin: Secondary | ICD-10-CM | POA: Diagnosis not present

## 2014-05-26 ENCOUNTER — Encounter (HOSPITAL_COMMUNITY): Payer: Self-pay | Admitting: Emergency Medicine

## 2014-05-26 ENCOUNTER — Emergency Department (INDEPENDENT_AMBULATORY_CARE_PROVIDER_SITE_OTHER): Payer: Medicare Other

## 2014-05-26 ENCOUNTER — Emergency Department (HOSPITAL_COMMUNITY)
Admission: EM | Admit: 2014-05-26 | Discharge: 2014-05-26 | Disposition: A | Discharge status: Home / Self Care | Payer: Medicare Other | Attending: Vascular Surgery | Admitting: Vascular Surgery

## 2014-05-26 ENCOUNTER — Emergency Department (HOSPITAL_COMMUNITY): Payer: Medicare Other

## 2014-05-26 ENCOUNTER — Emergency Department (INDEPENDENT_AMBULATORY_CARE_PROVIDER_SITE_OTHER)
Admission: EM | Admit: 2014-05-26 | Discharge: 2014-05-26 | Disposition: A | Discharge status: Home / Self Care | Payer: Medicare Other | Source: Home / Self Care | Attending: Family Medicine | Admitting: Family Medicine

## 2014-05-26 DIAGNOSIS — S5010XA Contusion of unspecified forearm, initial encounter: Secondary | ICD-10-CM | POA: Diagnosis not present

## 2014-05-26 DIAGNOSIS — S5012XA Contusion of left forearm, initial encounter: Secondary | ICD-10-CM

## 2014-05-26 DIAGNOSIS — I120 Hypertensive chronic kidney disease with stage 5 chronic kidney disease or end stage renal disease: Secondary | ICD-10-CM | POA: Diagnosis not present

## 2014-05-26 DIAGNOSIS — M25539 Pain in unspecified wrist: Secondary | ICD-10-CM | POA: Diagnosis not present

## 2014-05-26 DIAGNOSIS — T148XXA Other injury of unspecified body region, initial encounter: Secondary | ICD-10-CM

## 2014-05-26 DIAGNOSIS — Z8719 Personal history of other diseases of the digestive system: Secondary | ICD-10-CM | POA: Insufficient documentation

## 2014-05-26 DIAGNOSIS — Z87891 Personal history of nicotine dependence: Secondary | ICD-10-CM | POA: Insufficient documentation

## 2014-05-26 DIAGNOSIS — R079 Chest pain, unspecified: Secondary | ICD-10-CM | POA: Diagnosis not present

## 2014-05-26 DIAGNOSIS — M109 Gout, unspecified: Secondary | ICD-10-CM | POA: Insufficient documentation

## 2014-05-26 DIAGNOSIS — N186 End stage renal disease: Secondary | ICD-10-CM | POA: Insufficient documentation

## 2014-05-26 DIAGNOSIS — W19XXXA Unspecified fall, initial encounter: Secondary | ICD-10-CM | POA: Diagnosis not present

## 2014-05-26 DIAGNOSIS — Z862 Personal history of diseases of the blood and blood-forming organs and certain disorders involving the immune mechanism: Secondary | ICD-10-CM | POA: Diagnosis not present

## 2014-05-26 DIAGNOSIS — S59919A Unspecified injury of unspecified forearm, initial encounter: Secondary | ICD-10-CM | POA: Diagnosis not present

## 2014-05-26 DIAGNOSIS — S8000XA Contusion of unspecified knee, initial encounter: Secondary | ICD-10-CM | POA: Insufficient documentation

## 2014-05-26 DIAGNOSIS — Z9889 Other specified postprocedural states: Secondary | ICD-10-CM | POA: Insufficient documentation

## 2014-05-26 DIAGNOSIS — S6990XA Unspecified injury of unspecified wrist, hand and finger(s), initial encounter: Secondary | ICD-10-CM | POA: Diagnosis not present

## 2014-05-26 DIAGNOSIS — R011 Cardiac murmur, unspecified: Secondary | ICD-10-CM | POA: Insufficient documentation

## 2014-05-26 DIAGNOSIS — J45909 Unspecified asthma, uncomplicated: Secondary | ICD-10-CM | POA: Diagnosis not present

## 2014-05-26 DIAGNOSIS — Z8639 Personal history of other endocrine, nutritional and metabolic disease: Secondary | ICD-10-CM | POA: Insufficient documentation

## 2014-05-26 DIAGNOSIS — Z8701 Personal history of pneumonia (recurrent): Secondary | ICD-10-CM | POA: Insufficient documentation

## 2014-05-26 DIAGNOSIS — G8929 Other chronic pain: Secondary | ICD-10-CM | POA: Insufficient documentation

## 2014-05-26 DIAGNOSIS — S20219A Contusion of unspecified front wall of thorax, initial encounter: Secondary | ICD-10-CM | POA: Diagnosis not present

## 2014-05-26 DIAGNOSIS — Z79899 Other long term (current) drug therapy: Secondary | ICD-10-CM | POA: Insufficient documentation

## 2014-05-26 DIAGNOSIS — I209 Angina pectoris, unspecified: Secondary | ICD-10-CM | POA: Insufficient documentation

## 2014-05-26 DIAGNOSIS — E119 Type 2 diabetes mellitus without complications: Secondary | ICD-10-CM | POA: Insufficient documentation

## 2014-05-26 DIAGNOSIS — Z992 Dependence on renal dialysis: Secondary | ICD-10-CM | POA: Insufficient documentation

## 2014-05-26 DIAGNOSIS — Y92009 Unspecified place in unspecified non-institutional (private) residence as the place of occurrence of the external cause: Secondary | ICD-10-CM | POA: Insufficient documentation

## 2014-05-26 DIAGNOSIS — S8002XA Contusion of left knee, initial encounter: Secondary | ICD-10-CM

## 2014-05-26 DIAGNOSIS — M7989 Other specified soft tissue disorders: Secondary | ICD-10-CM | POA: Diagnosis not present

## 2014-05-26 DIAGNOSIS — S298XXA Other specified injuries of thorax, initial encounter: Secondary | ICD-10-CM | POA: Diagnosis not present

## 2014-05-26 DIAGNOSIS — S59909A Unspecified injury of unspecified elbow, initial encounter: Secondary | ICD-10-CM | POA: Diagnosis not present

## 2014-05-26 DIAGNOSIS — Y9389 Activity, other specified: Secondary | ICD-10-CM | POA: Insufficient documentation

## 2014-05-26 DIAGNOSIS — Z8781 Personal history of (healed) traumatic fracture: Secondary | ICD-10-CM | POA: Diagnosis not present

## 2014-05-26 DIAGNOSIS — R296 Repeated falls: Secondary | ICD-10-CM | POA: Insufficient documentation
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
## 2014-05-26 NOTE — Discharge Instructions (Signed)
Chest Contusion A chest contusion is a deep bruise on your chest area. Contusions are the result of an injury that caused bleeding under the skin. A chest contusion may involve bruising of the skin, muscles, or ribs. The contusion may turn blue, purple, or yellow. Minor injuries will give you a painless contusion, but more severe contusions may stay painful and swollen for a few weeks. CAUSES  A contusion is usually caused by a blow, trauma, or direct force to an area of the body. SYMPTOMS   Swelling and redness of the injured area.  Discoloration of the injured area.  Tenderness and soreness of the injured area.  Pain. DIAGNOSIS  The diagnosis can be made by taking a history and performing a physical exam. An X-ray, CT scan, or MRI may be needed to determine if there were any associated injuries, such as broken bones (fractures) or internal injuries. TREATMENT  Often, the best treatment for a chest contusion is resting, icing, and applying cold compresses to the injured area. Deep breathing exercises may be recommended to reduce the risk of pneumonia. Over-the-counter medicines may also be recommended for pain control. HOME CARE INSTRUCTIONS   Put ice on the injured area.  Put ice in a plastic bag.  Place a towel between your skin and the bag.  Leave the ice on for 15-20 minutes, 03-04 times a day.  Only take over-the-counter or prescription medicines as directed by your caregiver. Your caregiver may recommend avoiding anti-inflammatory medicines (aspirin, ibuprofen, and naproxen) for 48 hours because these medicines may increase bruising.  Rest the injured area.  Perform deep-breathing exercises as directed by your caregiver.  Stop smoking if you smoke.  Do not lift objects over 5 pounds (2.3 kg) for 3 days or longer if recommended by your caregiver. SEEK IMMEDIATE MEDICAL CARE IF:   You have increased bruising or swelling.  You have pain that is getting worse.  You have  difficulty breathing.  You have dizziness, weakness, or fainting.  You have blood in your urine or stool.  You cough up or vomit blood.  Your swelling or pain is not relieved with medicines. MAKE SURE YOU:   Understand these instructions.  Will watch your condition.  Will get help right away if you are not doing well or get worse. Document Released: 09/11/2001 Document Revised: 09/10/2012 Document Reviewed: 06/09/2012 [facility] Patient Information 2014 [place]. Hematoma A hematoma is a collection of blood under the skin, in an organ, in a body space, in a joint space, or in other tissue. The blood can clot to form a lump that you can see and feel. The lump is often firm and may sometimes become sore and tender. Most hematomas get better in a few days to weeks. However, some hematomas may be serious and require medical care. Hematomas can range in size from very small to very large. CAUSES  A hematoma can be caused by a blunt or penetrating injury. It can also be caused by spontaneous leakage from a blood vessel under the skin. Spontaneous leakage from a blood vessel is more likely to occur in older people, especially those taking blood thinners. Sometimes, a hematoma can develop after certain medical procedures. SIGNS AND SYMPTOMS   A firm lump on the body.  Possible pain and tenderness in the area.  Bruising.Blue, dark blue, purple-red, or yellowish skin may appear at the site of the hematoma if the hematoma is close to the surface of the skin. For hematomas in deeper  tissues or body spaces, the signs and symptoms may be subtle. For example, an intra-abdominal hematoma may cause abdominal pain, weakness, fainting, and shortness of breath. An intracranial hematoma may cause a headache or symptoms such as weakness, trouble speaking, or a change in consciousness. DIAGNOSIS  A hematoma can usually be diagnosed based on your medical history and a physical exam. Imaging tests may be  needed if your health care provider suspects a hematoma in deeper tissues or body spaces, such as the abdomen, head, or chest. These tests may include ultrasonography or a CT scan.  TREATMENT  Hematomas usually go away on their own over time. Rarely does the blood need to be drained out of the body. Large hematomas or those that may affect vital organs will sometimes need surgical drainage or monitoring. HOME CARE INSTRUCTIONS   Apply ice to the injured area:   Put ice in a plastic bag.   Place a towel between your skin and the bag.   Leave the ice on for 20 minutes, 2 3 times a day for the first 1 to 2 days.   After the first 2 days, switch to using warm compresses on the hematoma.   Elevate the injured area to help decrease pain and swelling. Wrapping the area with an elastic bandage may also be helpful. Compression helps to reduce swelling and promotes shrinking of the hematoma. Make sure the bandage is not wrapped too tight.   If your hematoma is on a lower extremity and is painful, crutches may be helpful for a couple days.   Only take over-the-counter or prescription medicines as directed by your health care provider. SEEK IMMEDIATE MEDICAL CARE IF:   You have increasing pain, or your pain is not controlled with medicine.   You have a fever.   You have worsening swelling or discoloration.   Your skin over the hematoma breaks or starts bleeding.   Your hematoma is in your chest or abdomen and you have weakness, shortness of breath, or a change in consciousness.  Your hematoma is on your scalp (caused by a fall or injury) and you have a worsening headache or a change in alertness or consciousness. MAKE SURE YOU:   Understand these instructions.  Will watch your condition.  Will get help right away if you are not doing well or get worse. Document Released: 07/31/2004 Document Revised: 08/19/2013 Document Reviewed: 05/27/2013 [facility] Patient Information 2014  [place].
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
## 2014-05-26 NOTE — ED Notes (Signed)
Dr. Georgina Snell is in the room w/pt Pt reports she fell and hurt her left forearm and left knee Slow steady gait assisted by walker Alert w/no signs of acute distress.

## 2014-05-26 NOTE — ED Notes (Signed)
Pt here from urgent care to see Vascular surgeon. Vascular surgeon at bedside, states pt does not need his services. Pt fell into night stand last night. Bruising to left side chest and L arm

## 2014-05-26 NOTE — ED Notes (Signed)
Pt. transferred from [facility] fell this morning from her bed , no LOC / ambulatory , reports pain at left forearm / right knee  , x-rays done at urgent care . Alert and oriented / respirations unlabored .

## 2014-05-26 NOTE — ED Provider Notes (Signed)
8:33 PM Pt transferred from UC after fall around 2am this morning. Was found to have L forearm hematoma that enlarged after BP cuff inflated at HD this morning.  She has has R knee pain, L upper chest wall pain with hematomas at all 3 sites. Vascular surgery has seen pt, does not believe this hematoma is causing vascular compromise, has easily found radial pulse w/ doppler. She has nml strength, sensation distally, with good perfusion, does have some pain in the forearm with flexion/extension of the wrist, though hematoma is not tense. Vascular does not believe she needs a fasciotomy. Will add CXR as this has not yet been done and will speak w/ orthopedics.    10:13 PM CXR w/o acute displaced fx. Ortho also does not feel pt has compartment syndrome. Pt will be d/c'd home w/ instructions for strict elevation. Return precautions given for new or worsening symptoms including worsening pain, SOB, fever.   1. Fall   2. Chest wall contusion   3. Contusion of left knee   4. Contusion of left forearm       Neta Ehlers, MD 05/27/14 1229

## 2014-05-26 NOTE — ED Notes (Signed)
Pt visitor trying to get pt to leave. Pt visitor mad that pt has not been to [facility] yet. Pt informed of delay and that we see pts in order that the xray orders were placed. Pt visitor states "actions speak louder than words. Yall just don't care." Pt visitor informed that we do care, however xrays are done in the order they were placed. MD aware. MD to come speak to pt.
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
## 2014-05-30 DIAGNOSIS — N186 End stage renal disease: Secondary | ICD-10-CM | POA: Diagnosis not present

## 2014-05-31 DIAGNOSIS — E1129 Type 2 diabetes mellitus with other diabetic kidney complication: Secondary | ICD-10-CM | POA: Diagnosis not present

## 2014-05-31 DIAGNOSIS — D509 Iron deficiency anemia, unspecified: Secondary | ICD-10-CM | POA: Diagnosis not present

## 2014-05-31 DIAGNOSIS — N186 End stage renal disease: Secondary | ICD-10-CM | POA: Diagnosis not present

## 2014-05-31 DIAGNOSIS — D631 Anemia in chronic kidney disease: Secondary | ICD-10-CM | POA: Diagnosis not present

## 2014-05-31 DIAGNOSIS — N2581 Secondary hyperparathyroidism of renal origin: Secondary | ICD-10-CM | POA: Diagnosis not present

## 2014-06-01 DIAGNOSIS — E785 Hyperlipidemia, unspecified: Secondary | ICD-10-CM | POA: Diagnosis not present

## 2014-06-01 DIAGNOSIS — E669 Obesity, unspecified: Secondary | ICD-10-CM | POA: Diagnosis not present

## 2014-06-01 DIAGNOSIS — Z79899 Other long term (current) drug therapy: Secondary | ICD-10-CM | POA: Diagnosis not present

## 2014-06-01 DIAGNOSIS — I119 Hypertensive heart disease without heart failure: Secondary | ICD-10-CM | POA: Diagnosis not present

## 2014-06-01 DIAGNOSIS — Z136 Encounter for screening for cardiovascular disorders: Secondary | ICD-10-CM | POA: Diagnosis not present

## 2014-06-01 DIAGNOSIS — I1310 Hypertensive heart and chronic kidney disease without heart failure, with stage 1 through stage 4 chronic kidney disease, or unspecified chronic kidney disease: Secondary | ICD-10-CM | POA: Diagnosis not present

## 2014-06-01 DIAGNOSIS — E1129 Type 2 diabetes mellitus with other diabetic kidney complication: Secondary | ICD-10-CM | POA: Diagnosis not present

## 2014-06-10 ENCOUNTER — Other Ambulatory Visit: Payer: Self-pay | Admitting: Pulmonary Disease

## 2014-06-10 DIAGNOSIS — N6489 Other specified disorders of breast: Secondary | ICD-10-CM

## 2014-06-10 DIAGNOSIS — S2000XA Contusion of breast, unspecified breast, initial encounter: Secondary | ICD-10-CM

## 2014-06-22 ENCOUNTER — Other Ambulatory Visit: Payer: Medicare Other

## 2014-06-22 DIAGNOSIS — Z961 Presence of intraocular lens: Secondary | ICD-10-CM | POA: Diagnosis not present

## 2014-06-22 DIAGNOSIS — H43819 Vitreous degeneration, unspecified eye: Secondary | ICD-10-CM | POA: Diagnosis not present

## 2014-06-22 DIAGNOSIS — E1139 Type 2 diabetes mellitus with other diabetic ophthalmic complication: Secondary | ICD-10-CM | POA: Diagnosis not present

## 2014-06-22 DIAGNOSIS — E11319 Type 2 diabetes mellitus with unspecified diabetic retinopathy without macular edema: Secondary | ICD-10-CM | POA: Diagnosis not present

## 2014-06-24 ENCOUNTER — Ambulatory Visit
Admission: RE | Admit: 2014-06-24 | Discharge: 2014-06-24 | Disposition: A | Discharge status: Home / Self Care | Payer: Medicare Other | Source: Ambulatory Visit | Attending: Pulmonary Disease | Admitting: Pulmonary Disease

## 2014-06-24 DIAGNOSIS — N6489 Other specified disorders of breast: Secondary | ICD-10-CM

## 2014-06-24 DIAGNOSIS — S2000XA Contusion of breast, unspecified breast, initial encounter: Secondary | ICD-10-CM

## 2014-06-24 DIAGNOSIS — N6459 Other signs and symptoms in breast: Secondary | ICD-10-CM | POA: Diagnosis not present

## 2014-06-24 DIAGNOSIS — R922 Inconclusive mammogram: Secondary | ICD-10-CM | POA: Diagnosis not present

## 2014-06-29 DIAGNOSIS — N186 End stage renal disease: Secondary | ICD-10-CM | POA: Diagnosis not present

## 2014-06-30 DIAGNOSIS — D509 Iron deficiency anemia, unspecified: Secondary | ICD-10-CM | POA: Diagnosis not present

## 2014-06-30 DIAGNOSIS — E1129 Type 2 diabetes mellitus with other diabetic kidney complication: Secondary | ICD-10-CM | POA: Diagnosis not present

## 2014-06-30 DIAGNOSIS — E785 Hyperlipidemia, unspecified: Secondary | ICD-10-CM | POA: Diagnosis not present

## 2014-06-30 DIAGNOSIS — N2581 Secondary hyperparathyroidism of renal origin: Secondary | ICD-10-CM | POA: Diagnosis not present

## 2014-06-30 DIAGNOSIS — T82898A Other specified complication of vascular prosthetic devices, implants and grafts, initial encounter: Secondary | ICD-10-CM | POA: Diagnosis not present

## 2014-06-30 DIAGNOSIS — D631 Anemia in chronic kidney disease: Secondary | ICD-10-CM | POA: Diagnosis not present

## 2014-06-30 DIAGNOSIS — E875 Hyperkalemia: Secondary | ICD-10-CM | POA: Diagnosis not present

## 2014-06-30 DIAGNOSIS — N186 End stage renal disease: Secondary | ICD-10-CM | POA: Diagnosis not present

## 2014-07-21 DIAGNOSIS — E1129 Type 2 diabetes mellitus with other diabetic kidney complication: Secondary | ICD-10-CM | POA: Diagnosis not present

## 2014-07-30 DIAGNOSIS — N186 End stage renal disease: Secondary | ICD-10-CM | POA: Diagnosis not present

## 2014-08-02 DIAGNOSIS — D509 Iron deficiency anemia, unspecified: Secondary | ICD-10-CM | POA: Diagnosis not present

## 2014-08-02 DIAGNOSIS — N186 End stage renal disease: Secondary | ICD-10-CM | POA: Diagnosis not present

## 2014-08-02 DIAGNOSIS — E875 Hyperkalemia: Secondary | ICD-10-CM | POA: Diagnosis not present

## 2014-08-02 DIAGNOSIS — E1129 Type 2 diabetes mellitus with other diabetic kidney complication: Secondary | ICD-10-CM | POA: Diagnosis not present

## 2014-08-04 DIAGNOSIS — E1129 Type 2 diabetes mellitus with other diabetic kidney complication: Secondary | ICD-10-CM | POA: Diagnosis not present

## 2014-08-04 DIAGNOSIS — D509 Iron deficiency anemia, unspecified: Secondary | ICD-10-CM | POA: Diagnosis not present

## 2014-08-04 DIAGNOSIS — E875 Hyperkalemia: Secondary | ICD-10-CM | POA: Diagnosis not present

## 2014-08-04 DIAGNOSIS — N186 End stage renal disease: Secondary | ICD-10-CM | POA: Diagnosis not present

## 2014-08-06 DIAGNOSIS — N186 End stage renal disease: Secondary | ICD-10-CM | POA: Diagnosis not present

## 2014-08-06 DIAGNOSIS — E875 Hyperkalemia: Secondary | ICD-10-CM | POA: Diagnosis not present

## 2014-08-06 DIAGNOSIS — E1129 Type 2 diabetes mellitus with other diabetic kidney complication: Secondary | ICD-10-CM | POA: Diagnosis not present

## 2014-08-06 DIAGNOSIS — D509 Iron deficiency anemia, unspecified: Secondary | ICD-10-CM | POA: Diagnosis not present

## 2014-08-09 DIAGNOSIS — D509 Iron deficiency anemia, unspecified: Secondary | ICD-10-CM | POA: Diagnosis not present

## 2014-08-09 DIAGNOSIS — E875 Hyperkalemia: Secondary | ICD-10-CM | POA: Diagnosis not present

## 2014-08-09 DIAGNOSIS — N186 End stage renal disease: Secondary | ICD-10-CM | POA: Diagnosis not present

## 2014-08-09 DIAGNOSIS — E1129 Type 2 diabetes mellitus with other diabetic kidney complication: Secondary | ICD-10-CM | POA: Diagnosis not present

## 2014-08-11 DIAGNOSIS — N186 End stage renal disease: Secondary | ICD-10-CM | POA: Diagnosis not present

## 2014-08-11 DIAGNOSIS — E1129 Type 2 diabetes mellitus with other diabetic kidney complication: Secondary | ICD-10-CM | POA: Diagnosis not present

## 2014-08-11 DIAGNOSIS — D509 Iron deficiency anemia, unspecified: Secondary | ICD-10-CM | POA: Diagnosis not present

## 2014-08-11 DIAGNOSIS — E875 Hyperkalemia: Secondary | ICD-10-CM | POA: Diagnosis not present

## 2014-08-13 DIAGNOSIS — E875 Hyperkalemia: Secondary | ICD-10-CM | POA: Diagnosis not present

## 2014-08-13 DIAGNOSIS — E1129 Type 2 diabetes mellitus with other diabetic kidney complication: Secondary | ICD-10-CM | POA: Diagnosis not present

## 2014-08-13 DIAGNOSIS — N186 End stage renal disease: Secondary | ICD-10-CM | POA: Diagnosis not present

## 2014-08-13 DIAGNOSIS — D509 Iron deficiency anemia, unspecified: Secondary | ICD-10-CM | POA: Diagnosis not present

## 2014-08-16 DIAGNOSIS — E875 Hyperkalemia: Secondary | ICD-10-CM | POA: Diagnosis not present

## 2014-08-16 DIAGNOSIS — N186 End stage renal disease: Secondary | ICD-10-CM | POA: Diagnosis not present

## 2014-08-16 DIAGNOSIS — E1129 Type 2 diabetes mellitus with other diabetic kidney complication: Secondary | ICD-10-CM | POA: Diagnosis not present

## 2014-08-16 DIAGNOSIS — D509 Iron deficiency anemia, unspecified: Secondary | ICD-10-CM | POA: Diagnosis not present

## 2014-08-18 DIAGNOSIS — D509 Iron deficiency anemia, unspecified: Secondary | ICD-10-CM | POA: Diagnosis not present

## 2014-08-18 DIAGNOSIS — N186 End stage renal disease: Secondary | ICD-10-CM | POA: Diagnosis not present

## 2014-08-18 DIAGNOSIS — E875 Hyperkalemia: Secondary | ICD-10-CM | POA: Diagnosis not present

## 2014-08-18 DIAGNOSIS — E1129 Type 2 diabetes mellitus with other diabetic kidney complication: Secondary | ICD-10-CM | POA: Diagnosis not present

## 2014-08-20 DIAGNOSIS — N186 End stage renal disease: Secondary | ICD-10-CM | POA: Diagnosis not present

## 2014-08-20 DIAGNOSIS — D509 Iron deficiency anemia, unspecified: Secondary | ICD-10-CM | POA: Diagnosis not present

## 2014-08-20 DIAGNOSIS — E875 Hyperkalemia: Secondary | ICD-10-CM | POA: Diagnosis not present

## 2014-08-20 DIAGNOSIS — E1129 Type 2 diabetes mellitus with other diabetic kidney complication: Secondary | ICD-10-CM | POA: Diagnosis not present

## 2014-08-23 DIAGNOSIS — E875 Hyperkalemia: Secondary | ICD-10-CM | POA: Diagnosis not present

## 2014-08-23 DIAGNOSIS — D509 Iron deficiency anemia, unspecified: Secondary | ICD-10-CM | POA: Diagnosis not present

## 2014-08-23 DIAGNOSIS — N186 End stage renal disease: Secondary | ICD-10-CM | POA: Diagnosis not present

## 2014-08-23 DIAGNOSIS — E1129 Type 2 diabetes mellitus with other diabetic kidney complication: Secondary | ICD-10-CM | POA: Diagnosis not present

## 2014-08-24 ENCOUNTER — Other Ambulatory Visit: Payer: Self-pay | Admitting: Pulmonary Disease

## 2014-08-24 DIAGNOSIS — N6489 Other specified disorders of breast: Secondary | ICD-10-CM

## 2014-08-24 DIAGNOSIS — S2000XA Contusion of breast, unspecified breast, initial encounter: Secondary | ICD-10-CM

## 2014-08-25 DIAGNOSIS — E875 Hyperkalemia: Secondary | ICD-10-CM | POA: Diagnosis not present

## 2014-08-25 DIAGNOSIS — D509 Iron deficiency anemia, unspecified: Secondary | ICD-10-CM | POA: Diagnosis not present

## 2014-08-25 DIAGNOSIS — E1129 Type 2 diabetes mellitus with other diabetic kidney complication: Secondary | ICD-10-CM | POA: Diagnosis not present

## 2014-08-25 DIAGNOSIS — N186 End stage renal disease: Secondary | ICD-10-CM | POA: Diagnosis not present

## 2014-08-27 DIAGNOSIS — E1129 Type 2 diabetes mellitus with other diabetic kidney complication: Secondary | ICD-10-CM | POA: Diagnosis not present

## 2014-08-27 DIAGNOSIS — D509 Iron deficiency anemia, unspecified: Secondary | ICD-10-CM | POA: Diagnosis not present

## 2014-08-27 DIAGNOSIS — E875 Hyperkalemia: Secondary | ICD-10-CM | POA: Diagnosis not present

## 2014-08-27 DIAGNOSIS — N186 End stage renal disease: Secondary | ICD-10-CM | POA: Diagnosis not present

## 2014-08-30 DIAGNOSIS — D509 Iron deficiency anemia, unspecified: Secondary | ICD-10-CM | POA: Diagnosis not present

## 2014-08-30 DIAGNOSIS — N186 End stage renal disease: Secondary | ICD-10-CM | POA: Diagnosis not present

## 2014-08-30 DIAGNOSIS — E1129 Type 2 diabetes mellitus with other diabetic kidney complication: Secondary | ICD-10-CM | POA: Diagnosis not present

## 2014-08-30 DIAGNOSIS — E875 Hyperkalemia: Secondary | ICD-10-CM | POA: Diagnosis not present

## 2014-09-01 DIAGNOSIS — N186 End stage renal disease: Secondary | ICD-10-CM | POA: Diagnosis not present

## 2014-09-01 DIAGNOSIS — E1129 Type 2 diabetes mellitus with other diabetic kidney complication: Secondary | ICD-10-CM | POA: Diagnosis not present

## 2014-09-01 DIAGNOSIS — N2581 Secondary hyperparathyroidism of renal origin: Secondary | ICD-10-CM | POA: Diagnosis not present

## 2014-09-01 DIAGNOSIS — N039 Chronic nephritic syndrome with unspecified morphologic changes: Secondary | ICD-10-CM | POA: Diagnosis not present

## 2014-09-01 DIAGNOSIS — D631 Anemia in chronic kidney disease: Secondary | ICD-10-CM | POA: Diagnosis not present

## 2014-09-03 DIAGNOSIS — D631 Anemia in chronic kidney disease: Secondary | ICD-10-CM | POA: Diagnosis not present

## 2014-09-03 DIAGNOSIS — N2581 Secondary hyperparathyroidism of renal origin: Secondary | ICD-10-CM | POA: Diagnosis not present

## 2014-09-03 DIAGNOSIS — E1129 Type 2 diabetes mellitus with other diabetic kidney complication: Secondary | ICD-10-CM | POA: Diagnosis not present

## 2014-09-03 DIAGNOSIS — N186 End stage renal disease: Secondary | ICD-10-CM | POA: Diagnosis not present

## 2014-09-06 DIAGNOSIS — E1129 Type 2 diabetes mellitus with other diabetic kidney complication: Secondary | ICD-10-CM | POA: Diagnosis not present

## 2014-09-06 DIAGNOSIS — D631 Anemia in chronic kidney disease: Secondary | ICD-10-CM | POA: Diagnosis not present

## 2014-09-06 DIAGNOSIS — N2581 Secondary hyperparathyroidism of renal origin: Secondary | ICD-10-CM | POA: Diagnosis not present

## 2014-09-06 DIAGNOSIS — N186 End stage renal disease: Secondary | ICD-10-CM | POA: Diagnosis not present

## 2014-09-07 ENCOUNTER — Other Ambulatory Visit: Payer: Medicare Other

## 2014-09-08 DIAGNOSIS — N2581 Secondary hyperparathyroidism of renal origin: Secondary | ICD-10-CM | POA: Diagnosis not present

## 2014-09-08 DIAGNOSIS — E1129 Type 2 diabetes mellitus with other diabetic kidney complication: Secondary | ICD-10-CM | POA: Diagnosis not present

## 2014-09-08 DIAGNOSIS — D631 Anemia in chronic kidney disease: Secondary | ICD-10-CM | POA: Diagnosis not present

## 2014-09-08 DIAGNOSIS — N186 End stage renal disease: Secondary | ICD-10-CM | POA: Diagnosis not present

## 2014-09-10 DIAGNOSIS — D631 Anemia in chronic kidney disease: Secondary | ICD-10-CM | POA: Diagnosis not present

## 2014-09-10 DIAGNOSIS — E1129 Type 2 diabetes mellitus with other diabetic kidney complication: Secondary | ICD-10-CM | POA: Diagnosis not present

## 2014-09-10 DIAGNOSIS — N186 End stage renal disease: Secondary | ICD-10-CM | POA: Diagnosis not present

## 2014-09-10 DIAGNOSIS — N2581 Secondary hyperparathyroidism of renal origin: Secondary | ICD-10-CM | POA: Diagnosis not present

## 2014-09-10 DIAGNOSIS — N039 Chronic nephritic syndrome with unspecified morphologic changes: Secondary | ICD-10-CM | POA: Diagnosis not present

## 2014-09-13 DIAGNOSIS — D631 Anemia in chronic kidney disease: Secondary | ICD-10-CM | POA: Diagnosis not present

## 2014-09-13 DIAGNOSIS — N039 Chronic nephritic syndrome with unspecified morphologic changes: Secondary | ICD-10-CM | POA: Diagnosis not present

## 2014-09-13 DIAGNOSIS — N186 End stage renal disease: Secondary | ICD-10-CM | POA: Diagnosis not present

## 2014-09-13 DIAGNOSIS — N2581 Secondary hyperparathyroidism of renal origin: Secondary | ICD-10-CM | POA: Diagnosis not present

## 2014-09-13 DIAGNOSIS — E1129 Type 2 diabetes mellitus with other diabetic kidney complication: Secondary | ICD-10-CM | POA: Diagnosis not present

## 2014-09-15 DIAGNOSIS — E1129 Type 2 diabetes mellitus with other diabetic kidney complication: Secondary | ICD-10-CM | POA: Diagnosis not present

## 2014-09-15 DIAGNOSIS — N186 End stage renal disease: Secondary | ICD-10-CM | POA: Diagnosis not present

## 2014-09-15 DIAGNOSIS — N2581 Secondary hyperparathyroidism of renal origin: Secondary | ICD-10-CM | POA: Diagnosis not present

## 2014-09-15 DIAGNOSIS — D631 Anemia in chronic kidney disease: Secondary | ICD-10-CM | POA: Diagnosis not present

## 2014-09-16 ENCOUNTER — Other Ambulatory Visit: Payer: Medicare Other

## 2014-09-17 DIAGNOSIS — N2581 Secondary hyperparathyroidism of renal origin: Secondary | ICD-10-CM | POA: Diagnosis not present

## 2014-09-17 DIAGNOSIS — D631 Anemia in chronic kidney disease: Secondary | ICD-10-CM | POA: Diagnosis not present

## 2014-09-17 DIAGNOSIS — N186 End stage renal disease: Secondary | ICD-10-CM | POA: Diagnosis not present

## 2014-09-17 DIAGNOSIS — E1129 Type 2 diabetes mellitus with other diabetic kidney complication: Secondary | ICD-10-CM | POA: Diagnosis not present

## 2014-09-20 ENCOUNTER — Other Ambulatory Visit: Payer: Self-pay | Admitting: Pulmonary Disease

## 2014-09-20 DIAGNOSIS — N186 End stage renal disease: Secondary | ICD-10-CM | POA: Diagnosis not present

## 2014-09-20 DIAGNOSIS — N2581 Secondary hyperparathyroidism of renal origin: Secondary | ICD-10-CM | POA: Diagnosis not present

## 2014-09-20 DIAGNOSIS — S2000XA Contusion of breast, unspecified breast, initial encounter: Secondary | ICD-10-CM

## 2014-09-20 DIAGNOSIS — N039 Chronic nephritic syndrome with unspecified morphologic changes: Secondary | ICD-10-CM | POA: Diagnosis not present

## 2014-09-20 DIAGNOSIS — E1129 Type 2 diabetes mellitus with other diabetic kidney complication: Secondary | ICD-10-CM | POA: Diagnosis not present

## 2014-09-20 DIAGNOSIS — N644 Mastodynia: Secondary | ICD-10-CM

## 2014-09-20 DIAGNOSIS — D631 Anemia in chronic kidney disease: Secondary | ICD-10-CM | POA: Diagnosis not present

## 2014-09-20 DIAGNOSIS — N6489 Other specified disorders of breast: Secondary | ICD-10-CM

## 2014-09-22 DIAGNOSIS — D631 Anemia in chronic kidney disease: Secondary | ICD-10-CM | POA: Diagnosis not present

## 2014-09-22 DIAGNOSIS — N2581 Secondary hyperparathyroidism of renal origin: Secondary | ICD-10-CM | POA: Diagnosis not present

## 2014-09-22 DIAGNOSIS — E1129 Type 2 diabetes mellitus with other diabetic kidney complication: Secondary | ICD-10-CM | POA: Diagnosis not present

## 2014-09-22 DIAGNOSIS — N186 End stage renal disease: Secondary | ICD-10-CM | POA: Diagnosis not present

## 2014-09-22 DIAGNOSIS — N039 Chronic nephritic syndrome with unspecified morphologic changes: Secondary | ICD-10-CM | POA: Diagnosis not present

## 2014-09-23 ENCOUNTER — Ambulatory Visit
Admission: RE | Admit: 2014-09-23 | Discharge: 2014-09-23 | Disposition: A | Discharge status: Home / Self Care | Payer: Medicare Other | Source: Ambulatory Visit | Attending: Pulmonary Disease | Admitting: Pulmonary Disease

## 2014-09-23 ENCOUNTER — Other Ambulatory Visit: Payer: Medicare Other

## 2014-09-23 DIAGNOSIS — N6489 Other specified disorders of breast: Secondary | ICD-10-CM

## 2014-09-23 DIAGNOSIS — IMO0002 Reserved for concepts with insufficient information to code with codable children: Secondary | ICD-10-CM | POA: Diagnosis not present

## 2014-09-23 DIAGNOSIS — S2000XA Contusion of breast, unspecified breast, initial encounter: Secondary | ICD-10-CM

## 2014-09-24 DIAGNOSIS — D631 Anemia in chronic kidney disease: Secondary | ICD-10-CM | POA: Diagnosis not present

## 2014-09-24 DIAGNOSIS — N186 End stage renal disease: Secondary | ICD-10-CM | POA: Diagnosis not present

## 2014-09-24 DIAGNOSIS — N2581 Secondary hyperparathyroidism of renal origin: Secondary | ICD-10-CM | POA: Diagnosis not present

## 2014-09-24 DIAGNOSIS — E1129 Type 2 diabetes mellitus with other diabetic kidney complication: Secondary | ICD-10-CM | POA: Diagnosis not present

## 2014-09-27 DIAGNOSIS — D631 Anemia in chronic kidney disease: Secondary | ICD-10-CM | POA: Diagnosis not present

## 2014-09-27 DIAGNOSIS — N186 End stage renal disease: Secondary | ICD-10-CM | POA: Diagnosis not present

## 2014-09-27 DIAGNOSIS — N2581 Secondary hyperparathyroidism of renal origin: Secondary | ICD-10-CM | POA: Diagnosis not present

## 2014-09-27 DIAGNOSIS — E1129 Type 2 diabetes mellitus with other diabetic kidney complication: Secondary | ICD-10-CM | POA: Diagnosis not present

## 2014-09-28 DIAGNOSIS — K5900 Constipation, unspecified: Secondary | ICD-10-CM | POA: Diagnosis not present

## 2014-09-28 DIAGNOSIS — I119 Hypertensive heart disease without heart failure: Secondary | ICD-10-CM | POA: Diagnosis not present

## 2014-09-28 DIAGNOSIS — E1129 Type 2 diabetes mellitus with other diabetic kidney complication: Secondary | ICD-10-CM | POA: Diagnosis not present

## 2014-09-28 DIAGNOSIS — Z1239 Encounter for other screening for malignant neoplasm of breast: Secondary | ICD-10-CM | POA: Diagnosis not present

## 2014-09-29 DIAGNOSIS — N2581 Secondary hyperparathyroidism of renal origin: Secondary | ICD-10-CM | POA: Diagnosis not present

## 2014-09-29 DIAGNOSIS — N186 End stage renal disease: Secondary | ICD-10-CM | POA: Diagnosis not present

## 2014-09-29 DIAGNOSIS — E1129 Type 2 diabetes mellitus with other diabetic kidney complication: Secondary | ICD-10-CM | POA: Diagnosis not present

## 2014-09-29 DIAGNOSIS — D631 Anemia in chronic kidney disease: Secondary | ICD-10-CM | POA: Diagnosis not present

## 2014-10-01 DIAGNOSIS — T82818A Embolism of vascular prosthetic devices, implants and grafts, initial encounter: Secondary | ICD-10-CM | POA: Diagnosis not present

## 2014-10-01 DIAGNOSIS — Z23 Encounter for immunization: Secondary | ICD-10-CM | POA: Diagnosis not present

## 2014-10-01 DIAGNOSIS — D509 Iron deficiency anemia, unspecified: Secondary | ICD-10-CM | POA: Diagnosis not present

## 2014-10-01 DIAGNOSIS — N186 End stage renal disease: Secondary | ICD-10-CM | POA: Diagnosis not present

## 2014-10-01 DIAGNOSIS — D631 Anemia in chronic kidney disease: Secondary | ICD-10-CM | POA: Diagnosis not present

## 2014-10-05 ENCOUNTER — Other Ambulatory Visit: Payer: Self-pay | Admitting: *Deleted

## 2014-10-05 DIAGNOSIS — N186 End stage renal disease: Secondary | ICD-10-CM

## 2014-10-08 ENCOUNTER — Encounter (HOSPITAL_COMMUNITY): Payer: Medicare Other

## 2014-10-08 ENCOUNTER — Ambulatory Visit: Payer: Medicare Other | Admitting: Vascular Surgery

## 2014-10-13 ENCOUNTER — Encounter: Payer: Self-pay | Admitting: Vascular Surgery

## 2014-10-14 ENCOUNTER — Encounter: Payer: Self-pay | Admitting: Vascular Surgery

## 2014-10-14 ENCOUNTER — Ambulatory Visit (INDEPENDENT_AMBULATORY_CARE_PROVIDER_SITE_OTHER): Payer: Medicare Other | Admitting: Vascular Surgery

## 2014-10-14 ENCOUNTER — Ambulatory Visit (HOSPITAL_COMMUNITY)
Admission: RE | Admit: 2014-10-14 | Discharge: 2014-10-14 | Disposition: A | Discharge status: Home / Self Care | Payer: Medicare Other | Source: Ambulatory Visit | Attending: Vascular Surgery | Admitting: Vascular Surgery

## 2014-10-14 VITALS — BP 122/56 | HR 64 | Resp 16 | Ht <= 58 in | Wt 142.3 lb

## 2014-10-14 DIAGNOSIS — I70269 Atherosclerosis of native arteries of extremities with gangrene, unspecified extremity: Secondary | ICD-10-CM

## 2014-10-14 DIAGNOSIS — I739 Peripheral vascular disease, unspecified: Secondary | ICD-10-CM | POA: Diagnosis not present

## 2014-10-14 DIAGNOSIS — E119 Type 2 diabetes mellitus without complications: Secondary | ICD-10-CM | POA: Insufficient documentation

## 2014-10-14 DIAGNOSIS — Z48812 Encounter for surgical aftercare following surgery on the circulatory system: Secondary | ICD-10-CM | POA: Diagnosis not present

## 2014-10-14 DIAGNOSIS — N186 End stage renal disease: Secondary | ICD-10-CM | POA: Insufficient documentation

## 2014-10-14 DIAGNOSIS — E785 Hyperlipidemia, unspecified: Secondary | ICD-10-CM | POA: Insufficient documentation

## 2014-10-14 DIAGNOSIS — I10 Essential (primary) hypertension: Secondary | ICD-10-CM | POA: Insufficient documentation

## 2014-10-14 NOTE — Progress Notes (Signed)
    Established Dialysis Access  History of Present Illness  [name] is a 80 y.o. ([birth year]) female who presents for re-evaluation for permanent access. The patient present for reconsideration for access.  Nothing has changed since my note on 04/16/14.  The patient continues to complete HD via her R [facility] which per the patient runs on green usually.  This TDC has been in place since 03/10/14.  The patient refused to go the [facility] V[facility] referral we scheduled for her.  The patient denies any fever or chills.  She complains of the [facility] being dirty externally.  The patient's PMH, PSH, SH, FamHx, Med, and Allergies are unchanged from 04/16/14.  On ROS today: no fever or chills, denies any rest pain, continued intermittent claudication   Physical Examination  Filed Vitals:   10/14/14 1224  BP: 122/56  Pulse: 64  Resp: 16  Height: 4\' 9"  (1.448 m)  Weight: 142 lb 4.8 oz (64.547 kg)   Body mass index is 30.78 kg/(m^2).  General: A&O x 3, WD, WN  Gastrointestinal: soft, NTND, -G/R, - HSM, - masses, - CVAT B, lower midline incision healed  Musculoskeletal: M/S 5/5 throughout , R fem TDC in place  ABI (Date: 10/14/2014)  R: 0.66 (0.95), DP: damp. mono, PT: bi, TBI: 0.30  L: [name] (1.12), DP: mono, PT: damp. mono, TBI: 0.47   Medical Decision Making  [name] is a 80 y.o. female who presents with ESRD requiring hemodialysis.   Nothing has changed in regards to the risk of a R thigh AVG.  The [facility] has been in place for > 1 year so I would expect she would develop severe venous hypertension with a thigh AVG.  Additionally, her ABI today demonstrate further deterioration of her peripheral arteries.  The patient elects to decline the R thigh AVG.  I have previously referred her to [facility] for a tertiary access procedure (Ax-ax graft), which she has declined again.  We had a frank discussion of her options and she appears resistant to considering peritoneal dialysis, which  I'm not certain she is a candidate for given prior adhesion from a TAH.  At this point, she probably should be considered for periodic TDC exchange and fibrin sheath angioplasty, which she isn't interested in currently.  [name], MD [facility] Office: [phone] Pager: [phone]  10/14/2014, 1:00 PM

## 2014-10-30 DIAGNOSIS — N186 End stage renal disease: Secondary | ICD-10-CM | POA: Diagnosis not present

## 2014-10-30 DIAGNOSIS — Z992 Dependence on renal dialysis: Secondary | ICD-10-CM | POA: Diagnosis not present

## 2014-11-01 DIAGNOSIS — N186 End stage renal disease: Secondary | ICD-10-CM | POA: Diagnosis not present

## 2014-11-01 DIAGNOSIS — D631 Anemia in chronic kidney disease: Secondary | ICD-10-CM | POA: Diagnosis not present

## 2014-11-01 DIAGNOSIS — D509 Iron deficiency anemia, unspecified: Secondary | ICD-10-CM | POA: Diagnosis not present

## 2014-11-29 DIAGNOSIS — N186 End stage renal disease: Secondary | ICD-10-CM | POA: Diagnosis not present

## 2014-11-29 DIAGNOSIS — Z992 Dependence on renal dialysis: Secondary | ICD-10-CM | POA: Diagnosis not present

## 2014-12-01 DIAGNOSIS — N186 End stage renal disease: Secondary | ICD-10-CM | POA: Diagnosis not present

## 2014-12-01 DIAGNOSIS — D631 Anemia in chronic kidney disease: Secondary | ICD-10-CM | POA: Diagnosis not present

## 2014-12-17 ENCOUNTER — Emergency Department (HOSPITAL_COMMUNITY)
Admission: EM | Admit: 2014-12-17 | Discharge: 2014-12-17 | Disposition: A | Discharge status: Home / Self Care | Payer: Medicare Other | Attending: Emergency Medicine | Admitting: Emergency Medicine

## 2014-12-17 ENCOUNTER — Encounter (HOSPITAL_COMMUNITY): Payer: Self-pay | Admitting: Emergency Medicine

## 2014-12-17 ENCOUNTER — Emergency Department (HOSPITAL_COMMUNITY): Payer: Medicare Other

## 2014-12-17 DIAGNOSIS — R011 Cardiac murmur, unspecified: Secondary | ICD-10-CM | POA: Insufficient documentation

## 2014-12-17 DIAGNOSIS — Z8781 Personal history of (healed) traumatic fracture: Secondary | ICD-10-CM | POA: Insufficient documentation

## 2014-12-17 DIAGNOSIS — M1990 Unspecified osteoarthritis, unspecified site: Secondary | ICD-10-CM | POA: Insufficient documentation

## 2014-12-17 DIAGNOSIS — Z8701 Personal history of pneumonia (recurrent): Secondary | ICD-10-CM | POA: Diagnosis not present

## 2014-12-17 DIAGNOSIS — K219 Gastro-esophageal reflux disease without esophagitis: Secondary | ICD-10-CM | POA: Diagnosis not present

## 2014-12-17 DIAGNOSIS — H9312 Tinnitus, left ear: Secondary | ICD-10-CM | POA: Insufficient documentation

## 2014-12-17 DIAGNOSIS — Z992 Dependence on renal dialysis: Secondary | ICD-10-CM | POA: Insufficient documentation

## 2014-12-17 DIAGNOSIS — N186 End stage renal disease: Secondary | ICD-10-CM | POA: Insufficient documentation

## 2014-12-17 DIAGNOSIS — G8929 Other chronic pain: Secondary | ICD-10-CM | POA: Insufficient documentation

## 2014-12-17 DIAGNOSIS — Z79899 Other long term (current) drug therapy: Secondary | ICD-10-CM | POA: Insufficient documentation

## 2014-12-17 DIAGNOSIS — Z87891 Personal history of nicotine dependence: Secondary | ICD-10-CM | POA: Diagnosis not present

## 2014-12-17 DIAGNOSIS — R51 Headache: Secondary | ICD-10-CM | POA: Insufficient documentation

## 2014-12-17 DIAGNOSIS — R42 Dizziness and giddiness: Secondary | ICD-10-CM | POA: Diagnosis not present

## 2014-12-17 DIAGNOSIS — I120 Hypertensive chronic kidney disease with stage 5 chronic kidney disease or end stage renal disease: Secondary | ICD-10-CM | POA: Diagnosis not present

## 2014-12-17 DIAGNOSIS — Z862 Personal history of diseases of the blood and blood-forming organs and certain disorders involving the immune mechanism: Secondary | ICD-10-CM | POA: Insufficient documentation

## 2014-12-17 DIAGNOSIS — J45901 Unspecified asthma with (acute) exacerbation: Secondary | ICD-10-CM | POA: Diagnosis not present

## 2014-12-17 DIAGNOSIS — H6122 Impacted cerumen, left ear: Secondary | ICD-10-CM | POA: Diagnosis not present

## 2014-12-17 DIAGNOSIS — E119 Type 2 diabetes mellitus without complications: Secondary | ICD-10-CM | POA: Insufficient documentation

## 2014-12-17 DIAGNOSIS — Z8639 Personal history of other endocrine, nutritional and metabolic disease: Secondary | ICD-10-CM | POA: Insufficient documentation

## 2014-12-17 DIAGNOSIS — I209 Angina pectoris, unspecified: Secondary | ICD-10-CM | POA: Diagnosis not present

## 2014-12-17 LAB — COMPREHENSIVE METABOLIC PANEL
ALT: 9 U/L (ref 0–35)
AST: 16 U/L (ref 0–37)
Albumin: 4.1 g/dL (ref 3.5–5.2)
Alkaline Phosphatase: 163 U/L — ABNORMAL HIGH (ref 39–117)
Anion gap: 18 — ABNORMAL HIGH (ref 5–15)
BUN: 26 mg/dL — ABNORMAL HIGH (ref 6–23)
CO2: 24 mEq/L (ref 19–32)
CREATININE: 4.89 mg/dL — AB (ref 0.50–1.10)
Calcium: 9.4 mg/dL (ref 8.4–10.5)
Chloride: 94 mEq/L — ABNORMAL LOW (ref 96–112)
GFR calc Af Amer: 9 mL/min — ABNORMAL LOW (ref >90)
GFR calc non Af Amer: 8 mL/min — ABNORMAL LOW (ref >90)
Glucose, Bld: 126 mg/dL — ABNORMAL HIGH (ref 70–99)
POTASSIUM: 3.9 meq/L (ref 3.7–5.3)
Sodium: 136 mEq/L — ABNORMAL LOW (ref 137–147)
Total Bilirubin: 0.4 mg/dL (ref 0.3–1.2)
Total Protein: 8.4 g/dL — ABNORMAL HIGH (ref 6.0–8.3)

## 2014-12-17 LAB — CBC WITH DIFFERENTIAL/PLATELET
Basophils Absolute: 0 10*3/uL (ref 0.0–0.1)
Basophils Relative: 0 % (ref 0–1)
Eosinophils Absolute: 0.1 10*3/uL (ref 0.0–0.7)
Eosinophils Relative: 2 % (ref 0–5)
HCT: 34.8 % — ABNORMAL LOW (ref 36.0–46.0)
Hemoglobin: 11 g/dL — ABNORMAL LOW (ref 12.0–15.0)
Lymphocytes Relative: 26 % (ref 12–46)
Lymphs Abs: 1.2 10*3/uL (ref 0.7–4.0)
MCH: 30.5 pg (ref 26.0–34.0)
MCHC: 31.6 g/dL (ref 30.0–36.0)
MCV: 96.4 fL (ref 78.0–100.0)
MONO ABS: 0.4 10*3/uL (ref 0.1–1.0)
Monocytes Relative: 8 % (ref 3–12)
NEUTROS ABS: 3.1 10*3/uL (ref 1.7–7.7)
NEUTROS PCT: 64 % (ref 43–77)
PLATELETS: 186 10*3/uL (ref 150–400)
RBC: 3.61 MIL/uL — ABNORMAL LOW (ref 3.87–5.11)
RDW: 13.8 % (ref 11.5–15.5)
WBC: 4.8 10*3/uL (ref 4.0–10.5)

## 2014-12-17 MED ORDER — CARBAMIDE PEROXIDE 6.5 % OT SOLN
5.0000 [drp] | Freq: Once | OTIC | Status: AC
  Administered 2014-12-17: 5 [drp] via OTIC
  Filled 2014-12-17: qty 15

## 2014-12-17 NOTE — ED Provider Notes (Signed)
CSN: [id number]     Arrival date & time 12/17/14  1911 History   First MD Initiated Contact with Patient 12/17/14 2007     Chief Complaint  Patient presents with  . Dizziness    (Consider location/radiation/quality/duration/timing/severity/associated sxs/prior Treatment) HPI Comments: The patient is a 80 year old female with a history of hypertension, PVD, aortic valve sclerosis, DM, and ESRD on M/W/F dialysis (dialyzed today) who presents to the ED today for further evaluation of dizziness. Patient states "it feels like there's someone running across my brains". She states she has had symptoms intermittently x 4-5 days. Symptoms are worse with position change. She denies alleviating factors and reports associated symptoms of unsteadiness, sporadic L posterior headache, and b/l tinnitus. No tinnitus or headache at present. No medications taken PTA. Patient denies associated fever, syncope, vision changes, vision loss, hearing loss, N/V, extremity numbness/paresthesias, and extremity weakness. Patient states she had a similar problem many years ago which improved after cerumen disimpaction of her L ear.  Patient is a 80 y.o. female presenting with dizziness. The history is provided by the patient. No language interpreter was used.  Dizziness Associated symptoms: headaches and tinnitus   Associated symptoms: no hearing loss, no nausea, no shortness of breath and no vomiting     Past Medical History  Diagnosis Date  . Hypertension   . Gout, unspecified 1980's    "not anymore" (03/09/2014)  . Mild mitral valve stenosis   . Gangrene     left fifth toe  . Peripheral vascular disease   . Hyperlipidemia   . Aortic valve sclerosis   . Shoulder fracture, right 2012  . Blood transfusion 1960's  . Chronic back pain   . Other specified cardiac dysrhythmias(427.89)     sees Dr. Angelena Form  . Heart murmur     "I think so" (11/25/2012)  . Anginal pain   . Anemia   . Exertional shortness of breath    . Asthma     "used to; not anymore" (03/09/2014)  . Pneumonia     "once; years ago" (03/09/2014)  . Type II diabetes mellitus     "not on any medication at this time" (03/09/2014)  . GERD (gastroesophageal reflux disease)     hx (03/09/2014)  . Arthritis     "all over" (03/09/2014)  . ESRD (end stage renal disease) on dialysis     M, W, Fr. [place] dialysis (03/09/2014)   Past Surgical History  Procedure Laterality Date  . Av fistula repair Bilateral     "right/left arm fistula failed; removed left thigh d/t poor circulation" (11/25/2012)  . Arteriovenous graft placement Left     femoral loop arteriovenous Gore-Tex graft.  . Av fistula placement  2008- 2013    "left upper arm; twice in my neck; left leg; removed from left leg; right upper arm" (11/25/2012)  . Foot amputation through metatarsal Left 06/22/11    "whole 5th toe" (11/25/2012)  . Pr vein bypass graft,aorto-fem-pop  06/14/2011  . Femoral-popliteal bypass graft  04/11/2012    Procedure: BYPASS GRAFT FEMORAL-POPLITEAL ARTERY;  Surgeon: [name], MD;  Location: [facility];  Service: Vascular;  Laterality: Left;  Thrombectomy/Left femoral-popliteal bypass with revision of proximal end and shortening of graft; intraoperative arteriogram; endarterectomy and patch angioplasty with distal anastomosis  . Appendectomy  1960's  . Femoral-popliteal bypass graft  10/09/2012    Procedure: BYPASS GRAFT FEMORAL-POPLITEAL ARTERY;  Surgeon: [name], MD;  Location: [facility];  Service: Vascular;  Laterality: Left;  Redo left tibioperoneal trunk bypass with Gortex Graft 45mmx80cm.  . Total abdominal hysterectomy  1960's    one ovary removed  . Kyphoplasty  11/25/2012    Procedure: KYPHOPLASTY;  Surgeon: Kristeen Miss, MD;  Location: [facility];  Service: Neurosurgery;  Laterality: N/A;  Lumbar two lumbar five Kyphoplasty  . Colonoscopy  2014?  Marland Kitchen Cataract extraction, bilateral Bilateral   . Exchange of a dialysis catheter Right  06/11/2013    Procedure: EXCHANGE OF A DIALYSIS CATHETER;  Surgeon: Conrad Houma, MD;  Location: [facility];  Service: Vascular;  Laterality: Right;  . Fistulogram Right 06/11/2013    Procedure: Venogram with angioplasty;  Surgeon: Conrad Watson, MD;  Location: [facility];  Service: Vascular;  Laterality: Right;  RIGHT CENTRAL VENOGRAM WITH ANGIOPLASTY  . Esophagogastroduodenoscopy N/A 09/10/2013    Procedure: ESOPHAGOGASTRODUODENOSCOPY (EGD);  Surgeon: [name], MD;  Location: [name];  Service: Endoscopy;  Laterality: N/A;  . Eye surgery    . Av fistula placement Right 03/09/2014    Procedure: RIGHT AXILLARY EXPLORATION; PARTIAL REMOVOAL OF OLD ARTERIOVENOUS (AV) GORE-TEX GRAFT; LIGATION OF RIGHT AXILLARY VEIN; ULTRASOUND GUIDED;  Surgeon: Conrad Roslyn, MD;  Location: [facility];  Service: Vascular;  Laterality: Right;  . Insertion of dialysis catheter Right 03/10/2014    Procedure: INSERTION OF TUNNELED  DIALYSIS CATHETER -attempted  RIGHT SUBCLAVIAN, RIGHT FEMORAL TUNNELED DIALYSIS CATHETER EXCHANGE with Inferior Vena Cava gram and Fibrin Sheath Angioplasty;  Surgeon: Conrad Chevy Chase, MD;  Location: [name];  Service: Vascular;  Laterality: Right;   Family History  Problem Relation Age of Onset  . Peripheral vascular disease Mother     amputation  . Hypertension Mother   . COPD Sister   . Heart attack Sister    History  Substance Use Topics  . Smoking status: Former Smoker -- 0.12 packs/day for 15 years    Types: Cigarettes  . Smokeless tobacco: Never Used     Comment: 03/09/2014 "quit smoking cigarettes in the 1990's"  . Alcohol Use: No     Comment: hx of abuse stopped 1990's   OB History    No data available      Review of Systems  Constitutional: Negative for fever.  HENT: Positive for tinnitus. Negative for ear pain and hearing loss.   Eyes: Negative for visual disturbance.  Respiratory: Negative for shortness of breath.   Gastrointestinal: Negative for nausea and vomiting.   Neurological: Positive for dizziness and headaches. Negative for syncope, weakness and numbness.  All other systems reviewed and are negative.   Allergies  Ace inhibitors  Home Medications   Prior to Admission medications   Medication Sig Start Date End Date Taking? Authorizing Provider  calcium acetate (PHOSLO) 667 MG capsule Take 2,001 mg by mouth 3 (three) times daily with meals.  09/25/12  Yes Historical Provider, MD  cloNIDine (CATAPRES) 0.2 MG tablet Take 0.2 mg by mouth 2 (two) times daily.  03/09/13  Yes Historical Provider, MD  metoprolol succinate (TOPROL-XL) 50 MG 24 hr tablet Take 50 mg by mouth daily. Take with or immediately following a meal. 04/15/12  Yes Samantha J Rhyne, PA-C  pantoprazole (PROTONIX) 40 MG tablet Take 40 mg by mouth 2 (two) times daily.   Yes Historical Provider, MD  ondansetron (ZOFRAN) 4 MG tablet Take 1 tablet (4 mg total) by mouth every 8 (eight) hours as needed for nausea or vomiting. 03/11/14   Ulyses Amor, PA-C   BP 158/51 mmHg  Pulse 62  Temp(Src) 97.6 F (36.4 C)  Resp 18  SpO2 100%   Physical Exam  Constitutional: She is oriented to person, place, and time. She appears well-developed and well-nourished. No distress.  Nontoxic/nonseptic appearing  HENT:  Head: Normocephalic and atraumatic.  Right Ear: Tympanic membrane, external ear and ear canal normal.  Left Ear: External ear and ear canal normal.  Mouth/Throat: Oropharynx is clear and moist. No oropharyngeal exudate.  Cerumen impaction L ear. Mild cerumen in R ear canal; normal R tympanic membrane.  Eyes: Conjunctivae and EOM are normal. Pupils are equal, round, and reactive to light. No scleral icterus.  Neck: Normal range of motion. Neck supple.  Cardiovascular: Normal rate, regular rhythm and intact distal pulses.   Distal radial pulse 2+ in LUE  Pulmonary/Chest: Effort normal. No respiratory distress.  Respirations even and unlabored  Musculoskeletal: Normal range of motion.   Neurological: She is alert and oriented to person, place, and time. She exhibits normal muscle tone. Coordination normal.  GCS 15. Speech is goal oriented. No cranial nerve deficits appreciated; symmetric eyebrow raise, no facial drooping, tongue midline. Patient has equal grip strength bilaterally and 5/5 strength and resistance in all major muscle groups bilaterally. Sensation to light touch intact. Patient moves extremities without ataxia. DTRs normal and symmetric. Patient ambulatory with 1 arm assistance by this writer with steady gait; walks with rolling walker at baseline.  Skin: Skin is warm and dry. No rash noted. She is not diaphoretic. No erythema. No pallor.  Psychiatric: She has a normal mood and affect. Her behavior is normal.  Nursing note and vitals reviewed.   ED Course  Procedures (including critical care time) Labs Review Labs Reviewed  CBC WITH DIFFERENTIAL - Abnormal; Notable for the following:    RBC 3.61 (*)    Hemoglobin 11.0 (*)    HCT 34.8 (*)    All other components within normal limits  COMPREHENSIVE METABOLIC PANEL - Abnormal; Notable for the following:    Sodium 136 (*)    Chloride 94 (*)    Glucose, Bld 126 (*)    BUN 26 (*)    Creatinine, Ser 4.89 (*)    Total Protein 8.4 (*)    Alkaline Phosphatase 163 (*)    GFR calc non Af Amer 8 (*)    GFR calc Af Amer 9 (*)    Anion gap 18 (*)    All other components within normal limits    Imaging Review Ct Head Wo Contrast  12/17/2014   CLINICAL DATA:  Dizziness, unsteady gait, bilateral ear discomfort  EXAM: CT HEAD WITHOUT CONTRAST  TECHNIQUE: Contiguous axial images were obtained from the base of the skull through the vertex without intravenous contrast.  COMPARISON:  06/10/2013  FINDINGS: There is no evidence of mass effect, midline shift, or extra-axial fluid collections. There is no evidence of a space-occupying lesion or intracranial hemorrhage. There is no evidence of a cortical-based area of acute  infarction. There is generalized cerebral atrophy. There is periventricular white matter low attenuation likely secondary to microangiopathy.  The ventricles and sulci are appropriate for the patient's age. The basal cisterns are patent.  Visualized portions of the orbits are unremarkable. The visualized portions of the paranasal sinuses and mastoid air cells are unremarkable. Cerebrovascular atherosclerotic calcifications are noted.  The osseous structures are unremarkable.  IMPRESSION: No acute intracranial pathology.   Electronically Signed   By: Kathreen Devoid   On: 12/17/2014 21:13     EKG Interpretation  None      MDM   Final diagnoses:  Dizziness  Cerumen impaction, left    80 year old female presents to the emergency department for further evaluation of dizziness. Symptoms have been intermittent over the past week. She reports similar symptoms in presence of cerumen impaction. Patient noted to have impacted cerumen in her left ear canal with a nonfocal neurologic exam. CT imaging today is negative for stroke, mass lesion, hemorrhage, and midline shift. She has mild hypochloremia and hyponatremia which appears c/w prior work ups on days dialyzed; patient dialyzed today. She has had resolution in her dizziness with cerumen disimpaction. Moderate amount of cerumen still remains in ear canal, but canal is more patent than prior to the procedure. Will refer to ENT for further evaluation of symptoms. Return precautions provided and patient agreeable to plan with no unaddressed concerns. Patient discharged in good condition. Patient seen also by my attending, Dr. Ashok Cordia, prior to discharge. Dr. Ashok Cordia in agreement with work up, assessment, management plan, and patient's stability for d/c today.   Filed Vitals:   12/17/14 2100 12/17/14 2115 12/17/14 2130 12/17/14 2215  BP:  175/51 165/51 158/51  Pulse: 58 84 65 62  Temp:      Resp:      SpO2: 100% 100% 100% 100%     Antonietta Breach,  PA-C 12/17/14 [place], MD 12/20/14 [id number]

## 2014-12-17 NOTE — ED Notes (Signed)
Pt. reports dizziness , unsteady gait and bilateral ear discomfort onset yhis week , denies nausea , alert and oriented , hemodialysis q Mon/Wed/Fri.

## 2014-12-17 NOTE — ED Notes (Signed)
[facility] phone number [facility] [phone]

## 2014-12-17 NOTE — Discharge Instructions (Signed)
Dizziness °Dizziness is a common problem. It is a feeling of unsteadiness or light-headedness. You may feel like you are about to faint. Dizziness can lead to injury if you stumble or fall. A person of any age group can suffer from dizziness, but dizziness is more common in older adults. °CAUSES  °Dizziness can be caused by many different things, including: °· Middle ear problems. °· Standing for too long. °· Infections. °· An allergic reaction. °· Aging. °· An emotional response to something, such as the sight of blood. °· Side effects of medicines. °· Tiredness. °· Problems with circulation or blood pressure. °· Excessive use of alcohol or medicines, or illegal drug use. °· Breathing too fast (hyperventilation). °· An irregular heart rhythm (arrhythmia). °· A low red blood cell count (anemia). °· Pregnancy. °· Vomiting, diarrhea, fever, or other illnesses that cause body fluid loss (dehydration). °· Diseases or conditions such as Parkinson's disease, high blood pressure (hypertension), diabetes, and thyroid problems. °· Exposure to extreme heat. °DIAGNOSIS  °Your health care provider will ask about your symptoms, perform a physical exam, and perform an electrocardiogram (ECG) to record the electrical activity of your heart. Your health care provider may also perform other heart or blood tests to determine the cause of your dizziness. These may include: °· Transthoracic echocardiogram (TTE). During echocardiography, sound waves are used to evaluate how blood flows through your heart. °· Transesophageal echocardiogram (TEE). °· Cardiac monitoring. This allows your health care provider to monitor your heart rate and rhythm in real time. °· Holter monitor. This is a portable device that records your heartbeat and can help diagnose heart arrhythmias. It allows your health care provider to track your heart activity for several days if needed. °· Stress tests by exercise or by giving medicine that makes the heart beat  faster. °TREATMENT  °Treatment of dizziness depends on the cause of your symptoms and can vary greatly. °HOME CARE INSTRUCTIONS  °· Drink enough fluids to keep your urine clear or pale yellow. This is especially important in very hot weather. In older adults, it is also important in cold weather. °· Take your medicine exactly as directed if your dizziness is caused by medicines. When taking blood pressure medicines, it is especially important to get up slowly. °¨ Rise slowly from chairs and steady yourself until you feel okay. °¨ In the morning, first sit up on the side of the bed. When you feel okay, stand slowly while holding onto something until you know your balance is fine. °· Move your legs often if you need to stand in one place for a long time. Tighten and relax your muscles in your legs while standing. °· Have someone stay with you for 1-2 days if dizziness continues to be a problem. Do this until you feel you are well enough to stay alone. Have the person call your health care provider if he or she notices changes in you that are concerning. °· Do not drive or use heavy machinery if you feel dizzy. °· Do not drink alcohol. °SEEK IMMEDIATE MEDICAL CARE IF:  °· Your dizziness or light-headedness gets worse. °· You feel nauseous or vomit. °· You have problems talking, walking, or using your arms, hands, or legs. °· You feel weak. °· You are not thinking clearly or you have trouble forming sentences. It may take a friend or family member to notice this. °· You have chest pain, abdominal pain, shortness of breath, or sweating. °· Your vision changes. °· You notice   any bleeding.  You have side effects from medicine that seems to be getting worse rather than better. MAKE SURE YOU:   Understand these instructions.  Will watch your condition.  Will get help right away if you are not doing well or get worse. Document Released: 06/12/2001 Document Revised: 12/22/2013 Document Reviewed: 07/06/2011 [facility]  Patient Information 2015 [place], [place]. This information is not intended to replace advice given to you by your health care provider. Make sure you discuss any questions you have with your health care provider.  Cerumen Impaction A cerumen impaction is when the wax in your ear forms a plug. This plug usually causes reduced hearing. Sometimes it also causes an earache or dizziness. Removing a cerumen impaction can be difficult and painful. The wax sticks to the ear canal. The canal is sensitive and bleeds easily. If you try to remove a heavy wax buildup with a cotton tipped swab, you may push it in further. Irrigation with water, suction, and small ear curettes may be used to clear out the wax. If the impaction is fixed to the skin in the ear canal, ear drops may be needed for a few days to loosen the wax. People who build up a lot of wax frequently can use ear wax removal products available in your local drugstore. SEEK MEDICAL CARE IF:  You develop an earache, increased hearing loss, or marked dizziness. Document Released: 01/24/2005 Document Revised: 03/10/2012 Document Reviewed: 03/16/2010 [facility] Patient Information 2015 [place], [place]. This information is not intended to replace advice given to you by your health care provider. Make sure you discuss any questions you have with your health care provider.

## 2014-12-30 DIAGNOSIS — Z992 Dependence on renal dialysis: Secondary | ICD-10-CM | POA: Diagnosis not present

## 2014-12-30 DIAGNOSIS — N186 End stage renal disease: Secondary | ICD-10-CM | POA: Diagnosis not present

## 2015-01-01 DIAGNOSIS — D631 Anemia in chronic kidney disease: Secondary | ICD-10-CM | POA: Diagnosis not present

## 2015-01-01 DIAGNOSIS — N186 End stage renal disease: Secondary | ICD-10-CM | POA: Diagnosis not present

## 2015-01-03 DIAGNOSIS — N186 End stage renal disease: Secondary | ICD-10-CM | POA: Diagnosis not present

## 2015-01-03 DIAGNOSIS — D631 Anemia in chronic kidney disease: Secondary | ICD-10-CM | POA: Diagnosis not present

## 2015-01-05 DIAGNOSIS — N186 End stage renal disease: Secondary | ICD-10-CM | POA: Diagnosis not present

## 2015-01-05 DIAGNOSIS — D631 Anemia in chronic kidney disease: Secondary | ICD-10-CM | POA: Diagnosis not present

## 2015-01-07 DIAGNOSIS — D631 Anemia in chronic kidney disease: Secondary | ICD-10-CM | POA: Diagnosis not present

## 2015-01-07 DIAGNOSIS — N186 End stage renal disease: Secondary | ICD-10-CM | POA: Diagnosis not present

## 2015-01-10 DIAGNOSIS — D631 Anemia in chronic kidney disease: Secondary | ICD-10-CM | POA: Diagnosis not present

## 2015-01-10 DIAGNOSIS — N186 End stage renal disease: Secondary | ICD-10-CM | POA: Diagnosis not present

## 2015-01-12 DIAGNOSIS — N186 End stage renal disease: Secondary | ICD-10-CM | POA: Diagnosis not present

## 2015-01-12 DIAGNOSIS — D631 Anemia in chronic kidney disease: Secondary | ICD-10-CM | POA: Diagnosis not present

## 2015-01-14 DIAGNOSIS — N186 End stage renal disease: Secondary | ICD-10-CM | POA: Diagnosis not present

## 2015-01-14 DIAGNOSIS — D631 Anemia in chronic kidney disease: Secondary | ICD-10-CM | POA: Diagnosis not present

## 2015-01-17 DIAGNOSIS — D631 Anemia in chronic kidney disease: Secondary | ICD-10-CM | POA: Diagnosis not present

## 2015-01-17 DIAGNOSIS — Z992 Dependence on renal dialysis: Secondary | ICD-10-CM | POA: Diagnosis not present

## 2015-01-17 DIAGNOSIS — N186 End stage renal disease: Secondary | ICD-10-CM | POA: Diagnosis not present

## 2015-01-17 DIAGNOSIS — I871 Compression of vein: Secondary | ICD-10-CM | POA: Diagnosis not present

## 2015-01-17 DIAGNOSIS — T82858D Stenosis of vascular prosthetic devices, implants and grafts, subsequent encounter: Secondary | ICD-10-CM | POA: Diagnosis not present

## 2015-01-19 DIAGNOSIS — D631 Anemia in chronic kidney disease: Secondary | ICD-10-CM | POA: Diagnosis not present

## 2015-01-19 DIAGNOSIS — N186 End stage renal disease: Secondary | ICD-10-CM | POA: Diagnosis not present

## 2015-01-21 DIAGNOSIS — N186 End stage renal disease: Secondary | ICD-10-CM | POA: Diagnosis not present

## 2015-01-21 DIAGNOSIS — D631 Anemia in chronic kidney disease: Secondary | ICD-10-CM | POA: Diagnosis not present

## 2015-01-24 DIAGNOSIS — N186 End stage renal disease: Secondary | ICD-10-CM | POA: Diagnosis not present

## 2015-01-24 DIAGNOSIS — D631 Anemia in chronic kidney disease: Secondary | ICD-10-CM | POA: Diagnosis not present

## 2015-01-26 DIAGNOSIS — N186 End stage renal disease: Secondary | ICD-10-CM | POA: Diagnosis not present

## 2015-01-26 DIAGNOSIS — D631 Anemia in chronic kidney disease: Secondary | ICD-10-CM | POA: Diagnosis not present

## 2015-01-28 DIAGNOSIS — D631 Anemia in chronic kidney disease: Secondary | ICD-10-CM | POA: Diagnosis not present

## 2015-01-28 DIAGNOSIS — N186 End stage renal disease: Secondary | ICD-10-CM | POA: Diagnosis not present

## 2015-01-30 DIAGNOSIS — Z992 Dependence on renal dialysis: Secondary | ICD-10-CM | POA: Diagnosis not present

## 2015-01-30 DIAGNOSIS — N186 End stage renal disease: Secondary | ICD-10-CM | POA: Diagnosis not present

## 2015-01-31 DIAGNOSIS — D631 Anemia in chronic kidney disease: Secondary | ICD-10-CM | POA: Diagnosis not present

## 2015-01-31 DIAGNOSIS — N2581 Secondary hyperparathyroidism of renal origin: Secondary | ICD-10-CM | POA: Diagnosis not present

## 2015-01-31 DIAGNOSIS — N186 End stage renal disease: Secondary | ICD-10-CM | POA: Diagnosis not present

## 2015-01-31 DIAGNOSIS — E1129 Type 2 diabetes mellitus with other diabetic kidney complication: Secondary | ICD-10-CM | POA: Diagnosis not present

## 2015-01-31 DIAGNOSIS — D509 Iron deficiency anemia, unspecified: Secondary | ICD-10-CM | POA: Diagnosis not present

## 2015-02-02 DIAGNOSIS — E1129 Type 2 diabetes mellitus with other diabetic kidney complication: Secondary | ICD-10-CM | POA: Diagnosis not present

## 2015-02-02 DIAGNOSIS — D631 Anemia in chronic kidney disease: Secondary | ICD-10-CM | POA: Diagnosis not present

## 2015-02-02 DIAGNOSIS — N2581 Secondary hyperparathyroidism of renal origin: Secondary | ICD-10-CM | POA: Diagnosis not present

## 2015-02-02 DIAGNOSIS — D509 Iron deficiency anemia, unspecified: Secondary | ICD-10-CM | POA: Diagnosis not present

## 2015-02-02 DIAGNOSIS — N186 End stage renal disease: Secondary | ICD-10-CM | POA: Diagnosis not present

## 2015-02-04 DIAGNOSIS — N186 End stage renal disease: Secondary | ICD-10-CM | POA: Diagnosis not present

## 2015-02-04 DIAGNOSIS — N2581 Secondary hyperparathyroidism of renal origin: Secondary | ICD-10-CM | POA: Diagnosis not present

## 2015-02-04 DIAGNOSIS — D631 Anemia in chronic kidney disease: Secondary | ICD-10-CM | POA: Diagnosis not present

## 2015-02-04 DIAGNOSIS — E1129 Type 2 diabetes mellitus with other diabetic kidney complication: Secondary | ICD-10-CM | POA: Diagnosis not present

## 2015-02-04 DIAGNOSIS — D509 Iron deficiency anemia, unspecified: Secondary | ICD-10-CM | POA: Diagnosis not present

## 2015-02-07 DIAGNOSIS — D509 Iron deficiency anemia, unspecified: Secondary | ICD-10-CM | POA: Diagnosis not present

## 2015-02-07 DIAGNOSIS — N186 End stage renal disease: Secondary | ICD-10-CM | POA: Diagnosis not present

## 2015-02-07 DIAGNOSIS — N2581 Secondary hyperparathyroidism of renal origin: Secondary | ICD-10-CM | POA: Diagnosis not present

## 2015-02-07 DIAGNOSIS — E1129 Type 2 diabetes mellitus with other diabetic kidney complication: Secondary | ICD-10-CM | POA: Diagnosis not present

## 2015-02-07 DIAGNOSIS — D631 Anemia in chronic kidney disease: Secondary | ICD-10-CM | POA: Diagnosis not present

## 2015-02-09 DIAGNOSIS — N186 End stage renal disease: Secondary | ICD-10-CM | POA: Diagnosis not present

## 2015-02-09 DIAGNOSIS — E1129 Type 2 diabetes mellitus with other diabetic kidney complication: Secondary | ICD-10-CM | POA: Diagnosis not present

## 2015-02-09 DIAGNOSIS — D631 Anemia in chronic kidney disease: Secondary | ICD-10-CM | POA: Diagnosis not present

## 2015-02-09 DIAGNOSIS — D509 Iron deficiency anemia, unspecified: Secondary | ICD-10-CM | POA: Diagnosis not present

## 2015-02-09 DIAGNOSIS — N2581 Secondary hyperparathyroidism of renal origin: Secondary | ICD-10-CM | POA: Diagnosis not present

## 2015-02-11 DIAGNOSIS — D631 Anemia in chronic kidney disease: Secondary | ICD-10-CM | POA: Diagnosis not present

## 2015-02-11 DIAGNOSIS — N186 End stage renal disease: Secondary | ICD-10-CM | POA: Diagnosis not present

## 2015-02-11 DIAGNOSIS — E1129 Type 2 diabetes mellitus with other diabetic kidney complication: Secondary | ICD-10-CM | POA: Diagnosis not present

## 2015-02-11 DIAGNOSIS — N2581 Secondary hyperparathyroidism of renal origin: Secondary | ICD-10-CM | POA: Diagnosis not present

## 2015-02-11 DIAGNOSIS — D509 Iron deficiency anemia, unspecified: Secondary | ICD-10-CM | POA: Diagnosis not present

## 2015-02-14 DIAGNOSIS — D509 Iron deficiency anemia, unspecified: Secondary | ICD-10-CM | POA: Diagnosis not present

## 2015-02-14 DIAGNOSIS — N186 End stage renal disease: Secondary | ICD-10-CM | POA: Diagnosis not present

## 2015-02-14 DIAGNOSIS — E1129 Type 2 diabetes mellitus with other diabetic kidney complication: Secondary | ICD-10-CM | POA: Diagnosis not present

## 2015-02-14 DIAGNOSIS — N2581 Secondary hyperparathyroidism of renal origin: Secondary | ICD-10-CM | POA: Diagnosis not present

## 2015-02-14 DIAGNOSIS — D631 Anemia in chronic kidney disease: Secondary | ICD-10-CM | POA: Diagnosis not present

## 2015-02-16 DIAGNOSIS — D631 Anemia in chronic kidney disease: Secondary | ICD-10-CM | POA: Diagnosis not present

## 2015-02-16 DIAGNOSIS — N2581 Secondary hyperparathyroidism of renal origin: Secondary | ICD-10-CM | POA: Diagnosis not present

## 2015-02-16 DIAGNOSIS — E1129 Type 2 diabetes mellitus with other diabetic kidney complication: Secondary | ICD-10-CM | POA: Diagnosis not present

## 2015-02-16 DIAGNOSIS — D509 Iron deficiency anemia, unspecified: Secondary | ICD-10-CM | POA: Diagnosis not present

## 2015-02-16 DIAGNOSIS — N186 End stage renal disease: Secondary | ICD-10-CM | POA: Diagnosis not present

## 2015-02-18 DIAGNOSIS — N186 End stage renal disease: Secondary | ICD-10-CM | POA: Diagnosis not present

## 2015-02-18 DIAGNOSIS — E1129 Type 2 diabetes mellitus with other diabetic kidney complication: Secondary | ICD-10-CM | POA: Diagnosis not present

## 2015-02-18 DIAGNOSIS — D509 Iron deficiency anemia, unspecified: Secondary | ICD-10-CM | POA: Diagnosis not present

## 2015-02-18 DIAGNOSIS — D631 Anemia in chronic kidney disease: Secondary | ICD-10-CM | POA: Diagnosis not present

## 2015-02-18 DIAGNOSIS — N2581 Secondary hyperparathyroidism of renal origin: Secondary | ICD-10-CM | POA: Diagnosis not present

## 2015-02-21 DIAGNOSIS — D509 Iron deficiency anemia, unspecified: Secondary | ICD-10-CM | POA: Diagnosis not present

## 2015-02-21 DIAGNOSIS — N2581 Secondary hyperparathyroidism of renal origin: Secondary | ICD-10-CM | POA: Diagnosis not present

## 2015-02-21 DIAGNOSIS — N186 End stage renal disease: Secondary | ICD-10-CM | POA: Diagnosis not present

## 2015-02-21 DIAGNOSIS — D631 Anemia in chronic kidney disease: Secondary | ICD-10-CM | POA: Diagnosis not present

## 2015-02-21 DIAGNOSIS — E1129 Type 2 diabetes mellitus with other diabetic kidney complication: Secondary | ICD-10-CM | POA: Diagnosis not present

## 2015-02-23 DIAGNOSIS — E1129 Type 2 diabetes mellitus with other diabetic kidney complication: Secondary | ICD-10-CM | POA: Diagnosis not present

## 2015-02-23 DIAGNOSIS — N2581 Secondary hyperparathyroidism of renal origin: Secondary | ICD-10-CM | POA: Diagnosis not present

## 2015-02-23 DIAGNOSIS — D509 Iron deficiency anemia, unspecified: Secondary | ICD-10-CM | POA: Diagnosis not present

## 2015-02-23 DIAGNOSIS — N186 End stage renal disease: Secondary | ICD-10-CM | POA: Diagnosis not present

## 2015-02-23 DIAGNOSIS — D631 Anemia in chronic kidney disease: Secondary | ICD-10-CM | POA: Diagnosis not present

## 2015-02-25 DIAGNOSIS — N2581 Secondary hyperparathyroidism of renal origin: Secondary | ICD-10-CM | POA: Diagnosis not present

## 2015-02-25 DIAGNOSIS — D631 Anemia in chronic kidney disease: Secondary | ICD-10-CM | POA: Diagnosis not present

## 2015-02-25 DIAGNOSIS — D509 Iron deficiency anemia, unspecified: Secondary | ICD-10-CM | POA: Diagnosis not present

## 2015-02-25 DIAGNOSIS — N186 End stage renal disease: Secondary | ICD-10-CM | POA: Diagnosis not present

## 2015-02-25 DIAGNOSIS — E1129 Type 2 diabetes mellitus with other diabetic kidney complication: Secondary | ICD-10-CM | POA: Diagnosis not present

## 2015-02-28 DIAGNOSIS — Z992 Dependence on renal dialysis: Secondary | ICD-10-CM | POA: Diagnosis not present

## 2015-02-28 DIAGNOSIS — D509 Iron deficiency anemia, unspecified: Secondary | ICD-10-CM | POA: Diagnosis not present

## 2015-02-28 DIAGNOSIS — N2581 Secondary hyperparathyroidism of renal origin: Secondary | ICD-10-CM | POA: Diagnosis not present

## 2015-02-28 DIAGNOSIS — E1129 Type 2 diabetes mellitus with other diabetic kidney complication: Secondary | ICD-10-CM | POA: Diagnosis not present

## 2015-02-28 DIAGNOSIS — D631 Anemia in chronic kidney disease: Secondary | ICD-10-CM | POA: Diagnosis not present

## 2015-02-28 DIAGNOSIS — N186 End stage renal disease: Secondary | ICD-10-CM | POA: Diagnosis not present

## 2015-03-01 DIAGNOSIS — E1129 Type 2 diabetes mellitus with other diabetic kidney complication: Secondary | ICD-10-CM | POA: Diagnosis not present

## 2015-03-01 DIAGNOSIS — Z79899 Other long term (current) drug therapy: Secondary | ICD-10-CM | POA: Diagnosis not present

## 2015-03-01 DIAGNOSIS — E785 Hyperlipidemia, unspecified: Secondary | ICD-10-CM | POA: Diagnosis not present

## 2015-03-01 DIAGNOSIS — E119 Type 2 diabetes mellitus without complications: Secondary | ICD-10-CM | POA: Diagnosis not present

## 2015-03-01 DIAGNOSIS — E784 Other hyperlipidemia: Secondary | ICD-10-CM | POA: Diagnosis not present

## 2015-03-01 DIAGNOSIS — K210 Gastro-esophageal reflux disease with esophagitis: Secondary | ICD-10-CM | POA: Diagnosis not present

## 2015-03-01 DIAGNOSIS — I119 Hypertensive heart disease without heart failure: Secondary | ICD-10-CM | POA: Diagnosis not present

## 2015-03-02 DIAGNOSIS — N186 End stage renal disease: Secondary | ICD-10-CM | POA: Diagnosis not present

## 2015-03-02 DIAGNOSIS — E1129 Type 2 diabetes mellitus with other diabetic kidney complication: Secondary | ICD-10-CM | POA: Diagnosis not present

## 2015-03-02 DIAGNOSIS — D631 Anemia in chronic kidney disease: Secondary | ICD-10-CM | POA: Diagnosis not present

## 2015-03-02 DIAGNOSIS — D509 Iron deficiency anemia, unspecified: Secondary | ICD-10-CM | POA: Diagnosis not present

## 2015-03-04 DIAGNOSIS — D509 Iron deficiency anemia, unspecified: Secondary | ICD-10-CM | POA: Diagnosis not present

## 2015-03-04 DIAGNOSIS — N186 End stage renal disease: Secondary | ICD-10-CM | POA: Diagnosis not present

## 2015-03-04 DIAGNOSIS — E1129 Type 2 diabetes mellitus with other diabetic kidney complication: Secondary | ICD-10-CM | POA: Diagnosis not present

## 2015-03-04 DIAGNOSIS — D631 Anemia in chronic kidney disease: Secondary | ICD-10-CM | POA: Diagnosis not present

## 2015-03-07 DIAGNOSIS — D509 Iron deficiency anemia, unspecified: Secondary | ICD-10-CM | POA: Diagnosis not present

## 2015-03-07 DIAGNOSIS — D631 Anemia in chronic kidney disease: Secondary | ICD-10-CM | POA: Diagnosis not present

## 2015-03-07 DIAGNOSIS — E1129 Type 2 diabetes mellitus with other diabetic kidney complication: Secondary | ICD-10-CM | POA: Diagnosis not present

## 2015-03-07 DIAGNOSIS — N186 End stage renal disease: Secondary | ICD-10-CM | POA: Diagnosis not present

## 2015-03-09 DIAGNOSIS — D509 Iron deficiency anemia, unspecified: Secondary | ICD-10-CM | POA: Diagnosis not present

## 2015-03-09 DIAGNOSIS — D631 Anemia in chronic kidney disease: Secondary | ICD-10-CM | POA: Diagnosis not present

## 2015-03-09 DIAGNOSIS — E1129 Type 2 diabetes mellitus with other diabetic kidney complication: Secondary | ICD-10-CM | POA: Diagnosis not present

## 2015-03-09 DIAGNOSIS — N186 End stage renal disease: Secondary | ICD-10-CM | POA: Diagnosis not present

## 2015-03-11 DIAGNOSIS — D631 Anemia in chronic kidney disease: Secondary | ICD-10-CM | POA: Diagnosis not present

## 2015-03-11 DIAGNOSIS — E1129 Type 2 diabetes mellitus with other diabetic kidney complication: Secondary | ICD-10-CM | POA: Diagnosis not present

## 2015-03-11 DIAGNOSIS — N186 End stage renal disease: Secondary | ICD-10-CM | POA: Diagnosis not present

## 2015-03-11 DIAGNOSIS — D509 Iron deficiency anemia, unspecified: Secondary | ICD-10-CM | POA: Diagnosis not present

## 2015-03-14 DIAGNOSIS — E1129 Type 2 diabetes mellitus with other diabetic kidney complication: Secondary | ICD-10-CM | POA: Diagnosis not present

## 2015-03-14 DIAGNOSIS — N186 End stage renal disease: Secondary | ICD-10-CM | POA: Diagnosis not present

## 2015-03-14 DIAGNOSIS — D631 Anemia in chronic kidney disease: Secondary | ICD-10-CM | POA: Diagnosis not present

## 2015-03-14 DIAGNOSIS — D509 Iron deficiency anemia, unspecified: Secondary | ICD-10-CM | POA: Diagnosis not present

## 2015-03-16 DIAGNOSIS — D509 Iron deficiency anemia, unspecified: Secondary | ICD-10-CM | POA: Diagnosis not present

## 2015-03-16 DIAGNOSIS — N186 End stage renal disease: Secondary | ICD-10-CM | POA: Diagnosis not present

## 2015-03-16 DIAGNOSIS — E1129 Type 2 diabetes mellitus with other diabetic kidney complication: Secondary | ICD-10-CM | POA: Diagnosis not present

## 2015-03-16 DIAGNOSIS — D631 Anemia in chronic kidney disease: Secondary | ICD-10-CM | POA: Diagnosis not present

## 2015-03-18 DIAGNOSIS — D509 Iron deficiency anemia, unspecified: Secondary | ICD-10-CM | POA: Diagnosis not present

## 2015-03-18 DIAGNOSIS — E1129 Type 2 diabetes mellitus with other diabetic kidney complication: Secondary | ICD-10-CM | POA: Diagnosis not present

## 2015-03-18 DIAGNOSIS — N186 End stage renal disease: Secondary | ICD-10-CM | POA: Diagnosis not present

## 2015-03-18 DIAGNOSIS — D631 Anemia in chronic kidney disease: Secondary | ICD-10-CM | POA: Diagnosis not present

## 2015-03-21 DIAGNOSIS — D631 Anemia in chronic kidney disease: Secondary | ICD-10-CM | POA: Diagnosis not present

## 2015-03-21 DIAGNOSIS — E1129 Type 2 diabetes mellitus with other diabetic kidney complication: Secondary | ICD-10-CM | POA: Diagnosis not present

## 2015-03-21 DIAGNOSIS — D509 Iron deficiency anemia, unspecified: Secondary | ICD-10-CM | POA: Diagnosis not present

## 2015-03-21 DIAGNOSIS — N186 End stage renal disease: Secondary | ICD-10-CM | POA: Diagnosis not present

## 2015-03-23 DIAGNOSIS — D631 Anemia in chronic kidney disease: Secondary | ICD-10-CM | POA: Diagnosis not present

## 2015-03-23 DIAGNOSIS — N186 End stage renal disease: Secondary | ICD-10-CM | POA: Diagnosis not present

## 2015-03-23 DIAGNOSIS — D509 Iron deficiency anemia, unspecified: Secondary | ICD-10-CM | POA: Diagnosis not present

## 2015-03-23 DIAGNOSIS — E1129 Type 2 diabetes mellitus with other diabetic kidney complication: Secondary | ICD-10-CM | POA: Diagnosis not present

## 2015-03-25 DIAGNOSIS — N186 End stage renal disease: Secondary | ICD-10-CM | POA: Diagnosis not present

## 2015-03-25 DIAGNOSIS — D509 Iron deficiency anemia, unspecified: Secondary | ICD-10-CM | POA: Diagnosis not present

## 2015-03-25 DIAGNOSIS — E1129 Type 2 diabetes mellitus with other diabetic kidney complication: Secondary | ICD-10-CM | POA: Diagnosis not present

## 2015-03-25 DIAGNOSIS — D631 Anemia in chronic kidney disease: Secondary | ICD-10-CM | POA: Diagnosis not present

## 2015-03-28 DIAGNOSIS — N186 End stage renal disease: Secondary | ICD-10-CM | POA: Diagnosis not present

## 2015-03-28 DIAGNOSIS — D631 Anemia in chronic kidney disease: Secondary | ICD-10-CM | POA: Diagnosis not present

## 2015-03-28 DIAGNOSIS — D509 Iron deficiency anemia, unspecified: Secondary | ICD-10-CM | POA: Diagnosis not present

## 2015-03-28 DIAGNOSIS — E1129 Type 2 diabetes mellitus with other diabetic kidney complication: Secondary | ICD-10-CM | POA: Diagnosis not present

## 2015-03-30 DIAGNOSIS — D509 Iron deficiency anemia, unspecified: Secondary | ICD-10-CM | POA: Diagnosis not present

## 2015-03-30 DIAGNOSIS — D631 Anemia in chronic kidney disease: Secondary | ICD-10-CM | POA: Diagnosis not present

## 2015-03-30 DIAGNOSIS — E1129 Type 2 diabetes mellitus with other diabetic kidney complication: Secondary | ICD-10-CM | POA: Diagnosis not present

## 2015-03-30 DIAGNOSIS — N186 End stage renal disease: Secondary | ICD-10-CM | POA: Diagnosis not present

## 2015-03-31 DIAGNOSIS — N186 End stage renal disease: Secondary | ICD-10-CM | POA: Diagnosis not present

## 2015-03-31 DIAGNOSIS — Z992 Dependence on renal dialysis: Secondary | ICD-10-CM | POA: Diagnosis not present

## 2015-04-01 DIAGNOSIS — E1129 Type 2 diabetes mellitus with other diabetic kidney complication: Secondary | ICD-10-CM | POA: Diagnosis not present

## 2015-04-01 DIAGNOSIS — D631 Anemia in chronic kidney disease: Secondary | ICD-10-CM | POA: Diagnosis not present

## 2015-04-01 DIAGNOSIS — N186 End stage renal disease: Secondary | ICD-10-CM | POA: Diagnosis not present

## 2015-04-01 DIAGNOSIS — N2581 Secondary hyperparathyroidism of renal origin: Secondary | ICD-10-CM | POA: Diagnosis not present

## 2015-04-04 DIAGNOSIS — D631 Anemia in chronic kidney disease: Secondary | ICD-10-CM | POA: Diagnosis not present

## 2015-04-04 DIAGNOSIS — E1129 Type 2 diabetes mellitus with other diabetic kidney complication: Secondary | ICD-10-CM | POA: Diagnosis not present

## 2015-04-04 DIAGNOSIS — N2581 Secondary hyperparathyroidism of renal origin: Secondary | ICD-10-CM | POA: Diagnosis not present

## 2015-04-04 DIAGNOSIS — N186 End stage renal disease: Secondary | ICD-10-CM | POA: Diagnosis not present

## 2015-04-06 DIAGNOSIS — N2581 Secondary hyperparathyroidism of renal origin: Secondary | ICD-10-CM | POA: Diagnosis not present

## 2015-04-06 DIAGNOSIS — N186 End stage renal disease: Secondary | ICD-10-CM | POA: Diagnosis not present

## 2015-04-06 DIAGNOSIS — E1129 Type 2 diabetes mellitus with other diabetic kidney complication: Secondary | ICD-10-CM | POA: Diagnosis not present

## 2015-04-06 DIAGNOSIS — D631 Anemia in chronic kidney disease: Secondary | ICD-10-CM | POA: Diagnosis not present

## 2015-04-07 ENCOUNTER — Other Ambulatory Visit: Payer: Self-pay | Admitting: Pulmonary Disease

## 2015-04-07 DIAGNOSIS — N6489 Other specified disorders of breast: Secondary | ICD-10-CM

## 2015-04-08 DIAGNOSIS — N186 End stage renal disease: Secondary | ICD-10-CM | POA: Diagnosis not present

## 2015-04-08 DIAGNOSIS — E1129 Type 2 diabetes mellitus with other diabetic kidney complication: Secondary | ICD-10-CM | POA: Diagnosis not present

## 2015-04-08 DIAGNOSIS — N2581 Secondary hyperparathyroidism of renal origin: Secondary | ICD-10-CM | POA: Diagnosis not present

## 2015-04-08 DIAGNOSIS — D631 Anemia in chronic kidney disease: Secondary | ICD-10-CM | POA: Diagnosis not present

## 2015-04-11 DIAGNOSIS — N186 End stage renal disease: Secondary | ICD-10-CM | POA: Diagnosis not present

## 2015-04-11 DIAGNOSIS — N2581 Secondary hyperparathyroidism of renal origin: Secondary | ICD-10-CM | POA: Diagnosis not present

## 2015-04-11 DIAGNOSIS — E1129 Type 2 diabetes mellitus with other diabetic kidney complication: Secondary | ICD-10-CM | POA: Diagnosis not present

## 2015-04-11 DIAGNOSIS — D631 Anemia in chronic kidney disease: Secondary | ICD-10-CM | POA: Diagnosis not present

## 2015-04-13 DIAGNOSIS — D631 Anemia in chronic kidney disease: Secondary | ICD-10-CM | POA: Diagnosis not present

## 2015-04-13 DIAGNOSIS — E1129 Type 2 diabetes mellitus with other diabetic kidney complication: Secondary | ICD-10-CM | POA: Diagnosis not present

## 2015-04-13 DIAGNOSIS — N186 End stage renal disease: Secondary | ICD-10-CM | POA: Diagnosis not present

## 2015-04-13 DIAGNOSIS — N2581 Secondary hyperparathyroidism of renal origin: Secondary | ICD-10-CM | POA: Diagnosis not present

## 2015-04-15 DIAGNOSIS — N2581 Secondary hyperparathyroidism of renal origin: Secondary | ICD-10-CM | POA: Diagnosis not present

## 2015-04-15 DIAGNOSIS — N186 End stage renal disease: Secondary | ICD-10-CM | POA: Diagnosis not present

## 2015-04-15 DIAGNOSIS — E1129 Type 2 diabetes mellitus with other diabetic kidney complication: Secondary | ICD-10-CM | POA: Diagnosis not present

## 2015-04-15 DIAGNOSIS — D631 Anemia in chronic kidney disease: Secondary | ICD-10-CM | POA: Diagnosis not present

## 2015-04-18 DIAGNOSIS — D631 Anemia in chronic kidney disease: Secondary | ICD-10-CM | POA: Diagnosis not present

## 2015-04-18 DIAGNOSIS — N186 End stage renal disease: Secondary | ICD-10-CM | POA: Diagnosis not present

## 2015-04-18 DIAGNOSIS — E1129 Type 2 diabetes mellitus with other diabetic kidney complication: Secondary | ICD-10-CM | POA: Diagnosis not present

## 2015-04-18 DIAGNOSIS — N2581 Secondary hyperparathyroidism of renal origin: Secondary | ICD-10-CM | POA: Diagnosis not present

## 2015-04-20 DIAGNOSIS — D631 Anemia in chronic kidney disease: Secondary | ICD-10-CM | POA: Diagnosis not present

## 2015-04-20 DIAGNOSIS — N186 End stage renal disease: Secondary | ICD-10-CM | POA: Diagnosis not present

## 2015-04-20 DIAGNOSIS — E1129 Type 2 diabetes mellitus with other diabetic kidney complication: Secondary | ICD-10-CM | POA: Diagnosis not present

## 2015-04-20 DIAGNOSIS — N2581 Secondary hyperparathyroidism of renal origin: Secondary | ICD-10-CM | POA: Diagnosis not present

## 2015-04-22 ENCOUNTER — Other Ambulatory Visit: Payer: Medicare Other

## 2015-04-22 DIAGNOSIS — N186 End stage renal disease: Secondary | ICD-10-CM | POA: Diagnosis not present

## 2015-04-22 DIAGNOSIS — E1129 Type 2 diabetes mellitus with other diabetic kidney complication: Secondary | ICD-10-CM | POA: Diagnosis not present

## 2015-04-22 DIAGNOSIS — N2581 Secondary hyperparathyroidism of renal origin: Secondary | ICD-10-CM | POA: Diagnosis not present

## 2015-04-22 DIAGNOSIS — D631 Anemia in chronic kidney disease: Secondary | ICD-10-CM | POA: Diagnosis not present

## 2015-04-25 DIAGNOSIS — E1129 Type 2 diabetes mellitus with other diabetic kidney complication: Secondary | ICD-10-CM | POA: Diagnosis not present

## 2015-04-25 DIAGNOSIS — N186 End stage renal disease: Secondary | ICD-10-CM | POA: Diagnosis not present

## 2015-04-25 DIAGNOSIS — D631 Anemia in chronic kidney disease: Secondary | ICD-10-CM | POA: Diagnosis not present

## 2015-04-25 DIAGNOSIS — N2581 Secondary hyperparathyroidism of renal origin: Secondary | ICD-10-CM | POA: Diagnosis not present

## 2015-04-27 DIAGNOSIS — N186 End stage renal disease: Secondary | ICD-10-CM | POA: Diagnosis not present

## 2015-04-27 DIAGNOSIS — N2581 Secondary hyperparathyroidism of renal origin: Secondary | ICD-10-CM | POA: Diagnosis not present

## 2015-04-27 DIAGNOSIS — D631 Anemia in chronic kidney disease: Secondary | ICD-10-CM | POA: Diagnosis not present

## 2015-04-27 DIAGNOSIS — E1129 Type 2 diabetes mellitus with other diabetic kidney complication: Secondary | ICD-10-CM | POA: Diagnosis not present

## 2015-04-28 ENCOUNTER — Other Ambulatory Visit: Payer: Medicare Other

## 2015-04-29 DIAGNOSIS — D631 Anemia in chronic kidney disease: Secondary | ICD-10-CM | POA: Diagnosis not present

## 2015-04-29 DIAGNOSIS — E1129 Type 2 diabetes mellitus with other diabetic kidney complication: Secondary | ICD-10-CM | POA: Diagnosis not present

## 2015-04-29 DIAGNOSIS — N186 End stage renal disease: Secondary | ICD-10-CM | POA: Diagnosis not present

## 2015-04-29 DIAGNOSIS — N2581 Secondary hyperparathyroidism of renal origin: Secondary | ICD-10-CM | POA: Diagnosis not present

## 2015-04-30 DIAGNOSIS — N186 End stage renal disease: Secondary | ICD-10-CM | POA: Diagnosis not present

## 2015-04-30 DIAGNOSIS — I120 Hypertensive chronic kidney disease with stage 5 chronic kidney disease or end stage renal disease: Secondary | ICD-10-CM | POA: Diagnosis not present

## 2015-04-30 DIAGNOSIS — Z992 Dependence on renal dialysis: Secondary | ICD-10-CM | POA: Diagnosis not present

## 2015-05-02 DIAGNOSIS — D631 Anemia in chronic kidney disease: Secondary | ICD-10-CM | POA: Diagnosis not present

## 2015-05-02 DIAGNOSIS — N2581 Secondary hyperparathyroidism of renal origin: Secondary | ICD-10-CM | POA: Diagnosis not present

## 2015-05-02 DIAGNOSIS — N186 End stage renal disease: Secondary | ICD-10-CM | POA: Diagnosis not present

## 2015-05-04 DIAGNOSIS — N2581 Secondary hyperparathyroidism of renal origin: Secondary | ICD-10-CM | POA: Diagnosis not present

## 2015-05-04 DIAGNOSIS — D631 Anemia in chronic kidney disease: Secondary | ICD-10-CM | POA: Diagnosis not present

## 2015-05-04 DIAGNOSIS — N186 End stage renal disease: Secondary | ICD-10-CM | POA: Diagnosis not present

## 2015-05-06 DIAGNOSIS — N186 End stage renal disease: Secondary | ICD-10-CM | POA: Diagnosis not present

## 2015-05-06 DIAGNOSIS — D631 Anemia in chronic kidney disease: Secondary | ICD-10-CM | POA: Diagnosis not present

## 2015-05-06 DIAGNOSIS — N2581 Secondary hyperparathyroidism of renal origin: Secondary | ICD-10-CM | POA: Diagnosis not present

## 2015-05-09 DIAGNOSIS — N2581 Secondary hyperparathyroidism of renal origin: Secondary | ICD-10-CM | POA: Diagnosis not present

## 2015-05-09 DIAGNOSIS — N186 End stage renal disease: Secondary | ICD-10-CM | POA: Diagnosis not present

## 2015-05-09 DIAGNOSIS — D631 Anemia in chronic kidney disease: Secondary | ICD-10-CM | POA: Diagnosis not present

## 2015-05-10 ENCOUNTER — Emergency Department (HOSPITAL_COMMUNITY): Payer: Medicare Other

## 2015-05-10 ENCOUNTER — Encounter (HOSPITAL_COMMUNITY): Payer: Self-pay | Admitting: Emergency Medicine

## 2015-05-10 ENCOUNTER — Emergency Department (HOSPITAL_COMMUNITY)
Admission: EM | Admit: 2015-05-10 | Discharge: 2015-05-10 | Disposition: A | Discharge status: Home / Self Care | Payer: Medicare Other | Attending: Emergency Medicine | Admitting: Emergency Medicine

## 2015-05-10 DIAGNOSIS — M5030 Other cervical disc degeneration, unspecified cervical region: Secondary | ICD-10-CM | POA: Diagnosis not present

## 2015-05-10 DIAGNOSIS — J0120 Acute ethmoidal sinusitis, unspecified: Secondary | ICD-10-CM | POA: Insufficient documentation

## 2015-05-10 DIAGNOSIS — G8929 Other chronic pain: Secondary | ICD-10-CM | POA: Diagnosis not present

## 2015-05-10 DIAGNOSIS — Z8701 Personal history of pneumonia (recurrent): Secondary | ICD-10-CM | POA: Diagnosis not present

## 2015-05-10 DIAGNOSIS — Z992 Dependence on renal dialysis: Secondary | ICD-10-CM | POA: Insufficient documentation

## 2015-05-10 DIAGNOSIS — J45909 Unspecified asthma, uncomplicated: Secondary | ICD-10-CM | POA: Diagnosis not present

## 2015-05-10 DIAGNOSIS — M1990 Unspecified osteoarthritis, unspecified site: Secondary | ICD-10-CM | POA: Diagnosis not present

## 2015-05-10 DIAGNOSIS — Z862 Personal history of diseases of the blood and blood-forming organs and certain disorders involving the immune mechanism: Secondary | ICD-10-CM | POA: Diagnosis not present

## 2015-05-10 DIAGNOSIS — I209 Angina pectoris, unspecified: Secondary | ICD-10-CM | POA: Insufficient documentation

## 2015-05-10 DIAGNOSIS — K219 Gastro-esophageal reflux disease without esophagitis: Secondary | ICD-10-CM | POA: Diagnosis not present

## 2015-05-10 DIAGNOSIS — R51 Headache: Secondary | ICD-10-CM | POA: Diagnosis present

## 2015-05-10 DIAGNOSIS — E119 Type 2 diabetes mellitus without complications: Secondary | ICD-10-CM | POA: Diagnosis not present

## 2015-05-10 DIAGNOSIS — N186 End stage renal disease: Secondary | ICD-10-CM | POA: Diagnosis not present

## 2015-05-10 DIAGNOSIS — R011 Cardiac murmur, unspecified: Secondary | ICD-10-CM | POA: Insufficient documentation

## 2015-05-10 DIAGNOSIS — G4489 Other headache syndrome: Secondary | ICD-10-CM | POA: Insufficient documentation

## 2015-05-10 DIAGNOSIS — M47812 Spondylosis without myelopathy or radiculopathy, cervical region: Secondary | ICD-10-CM

## 2015-05-10 DIAGNOSIS — Z87891 Personal history of nicotine dependence: Secondary | ICD-10-CM | POA: Insufficient documentation

## 2015-05-10 DIAGNOSIS — I120 Hypertensive chronic kidney disease with stage 5 chronic kidney disease or end stage renal disease: Secondary | ICD-10-CM | POA: Insufficient documentation

## 2015-05-10 DIAGNOSIS — M47892 Other spondylosis, cervical region: Secondary | ICD-10-CM | POA: Diagnosis not present

## 2015-05-10 LAB — SEDIMENTATION RATE: SED RATE: 10 mm/h (ref 0–22)

## 2015-05-10 LAB — CBC WITH DIFFERENTIAL/PLATELET
BASOS ABS: 0 10*3/uL (ref 0.0–0.1)
Basophils Relative: 0 % (ref 0–1)
EOS ABS: 0.3 10*3/uL (ref 0.0–0.7)
EOS PCT: 5 % (ref 0–5)
HCT: 35.3 % — ABNORMAL LOW (ref 36.0–46.0)
Hemoglobin: 10.9 g/dL — ABNORMAL LOW (ref 12.0–15.0)
LYMPHS ABS: 1.3 10*3/uL (ref 0.7–4.0)
LYMPHS PCT: 25 % (ref 12–46)
MCH: 30.1 pg (ref 26.0–34.0)
MCHC: 30.9 g/dL (ref 30.0–36.0)
MCV: 97.5 fL (ref 78.0–100.0)
Monocytes Absolute: 0.3 10*3/uL (ref 0.1–1.0)
Monocytes Relative: 5 % (ref 3–12)
NEUTROS PCT: 65 % (ref 43–77)
Neutro Abs: 3.5 10*3/uL (ref 1.7–7.7)
PLATELETS: 135 10*3/uL — AB (ref 150–400)
RBC: 3.62 MIL/uL — ABNORMAL LOW (ref 3.87–5.11)
RDW: 14.4 % (ref 11.5–15.5)
WBC: 5.4 10*3/uL (ref 4.0–10.5)

## 2015-05-10 LAB — BASIC METABOLIC PANEL
Anion gap: 9 (ref 5–15)
BUN: 52 mg/dL — ABNORMAL HIGH (ref 6–20)
CO2: 27 mmol/L (ref 22–32)
Calcium: 10.5 mg/dL — ABNORMAL HIGH (ref 8.9–10.3)
Chloride: 101 mmol/L (ref 101–111)
Creatinine, Ser: 8.97 mg/dL — ABNORMAL HIGH (ref 0.44–1.00)
GFR calc Af Amer: 4 mL/min — ABNORMAL LOW (ref >60)
GFR calc non Af Amer: 4 mL/min — ABNORMAL LOW (ref >60)
GLUCOSE: 120 mg/dL — AB (ref 70–99)
POTASSIUM: 5.4 mmol/L — AB (ref 3.5–5.1)
Sodium: 137 mmol/L (ref 135–145)

## 2015-05-10 LAB — PROTIME-INR
INR: 1.04 (ref 0.00–1.49)
Prothrombin Time: 13.7 seconds (ref 11.6–15.2)

## 2015-05-10 MED ORDER — LORATADINE 10 MG PO TABS: 10.0000 mg | ORAL_TABLET | Freq: Every day | ORAL | Status: DC

## 2015-05-10 MED ORDER — AMOXICILLIN-POT CLAVULANATE 875-125 MG PO TABS: 1.0000 | ORAL_TABLET | Freq: Two times a day (BID) | ORAL | Status: DC

## 2015-05-10 MED ORDER — TRAMADOL HCL 50 MG PO TABS
50.0000 mg | ORAL_TABLET | Freq: Once | ORAL | Status: AC
  Administered 2015-05-10: 50 mg via ORAL
  Filled 2015-05-10: qty 1

## 2015-05-10 MED ORDER — OXYCODONE-ACETAMINOPHEN 5-325 MG PO TABS: 1.0000 | ORAL_TABLET | ORAL | Status: DC | PRN

## 2015-05-10 MED ORDER — ACETAMINOPHEN 500 MG PO TABS
1000.0000 mg | ORAL_TABLET | Freq: Once | ORAL | Status: AC
Start: 2015-05-10 — End: 2015-05-10
  Administered 2015-05-10: 1000 mg via ORAL
  Filled 2015-05-10: qty 2

## 2015-05-10 NOTE — ED Notes (Signed)
MD at bedside. 

## 2015-05-10 NOTE — ED Notes (Signed)
I tried to get blood and was unsuccessful

## 2015-05-10 NOTE — ED Notes (Addendum)
Pt reports left posterior headache/pressure for 2-3 weeks. Pt denies injury. Neuro unremarkable upon assessment. Pillow given to pt for comfort. Primary nurse aware of vital signs as charted by triage.

## 2015-05-10 NOTE — Discharge Instructions (Signed)
Sinusitis Sinusitis is redness, soreness, and inflammation of the paranasal sinuses. Paranasal sinuses are air pockets within the bones of your face (beneath the eyes, the middle of the forehead, or above the eyes). In healthy paranasal sinuses, mucus is able to drain out, and air is able to circulate through them by way of your nose. However, when your paranasal sinuses are inflamed, mucus and air can become trapped. This can allow bacteria and other germs to grow and cause infection. Sinusitis can develop quickly and last only a short time (acute) or continue over a long period (chronic). Sinusitis that lasts for more than 12 weeks is considered chronic.  CAUSES  Causes of sinusitis include:  Allergies.  Structural abnormalities, such as displacement of the cartilage that separates your nostrils (deviated septum), which can decrease the air flow through your nose and sinuses and affect sinus drainage.  Functional abnormalities, such as when the small hairs (cilia) that line your sinuses and help remove mucus do not work properly or are not present. SIGNS AND SYMPTOMS  Symptoms of acute and chronic sinusitis are the same. The primary symptoms are pain and pressure around the affected sinuses. Other symptoms include:  Upper toothache.  Earache.  Headache.  Bad breath.  Decreased sense of smell and taste.  A cough, which worsens when you are lying flat.  Fatigue.  Fever.  Thick drainage from your nose, which often is green and may contain pus (purulent).  Swelling and warmth over the affected sinuses. DIAGNOSIS  Your health care provider will perform a physical exam. During the exam, your health care provider may:  Look in your nose for signs of abnormal growths in your nostrils (nasal polyps).  Tap over the affected sinus to check for signs of infection.  View the inside of your sinuses (endoscopy) using an imaging device that has a light attached (endoscope). If your health  care provider suspects that you have chronic sinusitis, one or more of the following tests may be recommended:  Allergy tests.  Nasal culture. A sample of mucus is taken from your nose, sent to a lab, and screened for bacteria.  Nasal cytology. A sample of mucus is taken from your nose and examined by your health care provider to determine if your sinusitis is related to an allergy. TREATMENT  Most cases of acute sinusitis are related to a viral infection and will resolve on their own within 10 days. Sometimes medicines are prescribed to help relieve symptoms (pain medicine, decongestants, nasal steroid sprays, or saline sprays).  However, for sinusitis related to a bacterial infection, your health care provider will prescribe antibiotic medicines. These are medicines that will help kill the bacteria causing the infection.  Rarely, sinusitis is caused by a fungal infection. In theses cases, your health care provider will prescribe antifungal medicine. For some cases of chronic sinusitis, surgery is needed. Generally, these are cases in which sinusitis recurs more than 3 times per year, despite other treatments. HOME CARE INSTRUCTIONS   Drink plenty of water. Water helps thin the mucus so your sinuses can drain more easily.  Use a humidifier.  Inhale steam 3 to 4 times a day (for example, sit in the bathroom with the shower running).  Apply a warm, moist washcloth to your face 3 to 4 times a day, or as directed by your health care provider.  Use saline nasal sprays to help moisten and clean your sinuses.  Take medicines only as directed by your health care provider.    If you were prescribed either an antibiotic or antifungal medicine, finish it all even if you start to feel better. SEEK IMMEDIATE MEDICAL CARE IF:  You have increasing pain or severe headaches.  You have nausea, vomiting, or drowsiness.  You have swelling around your face.  You have vision problems.  You have a stiff  neck.  You have difficulty breathing. MAKE SURE YOU:   Understand these instructions.  Will watch your condition.  Will get help right away if you are not doing well or get worse. Document Released: 12/17/2005 Document Revised: 05/03/2014 Document Reviewed: 01/01/2012 [facility] Patient Information 2015 [place], [place]. This information is not intended to replace advice given to you by your health care provider. Make sure you discuss any questions you have with your health care provider. Headache and Arthritis Headaches and arthritis are common problems. This causes an interest in the possible role of arthritis in causing headaches. Several major forms of arthritis exist. Two of the most common types are:  Rheumatoid arthritis.  Osteoarthritis. Rheumatoid arthritis may begin at any age. It is a condition in which the body attacks some of its own tissues, thinking they do not belong. This leads to destruction of the bony areas around the joints. This condition may afflict any of the body's joints. It usually produces a deformity of the joint. The hands and fingers no longer appear straight but often appear angled towards one side. In some cases, the spine may be involved. Most often it is the vertebrae of the neck (cervical spine). The areas of the neck most commonly afflicted by rheumatoid arthritis are the first and second cervical vertebrae. Curiously, rheumatoid arthritis, though it often produces severe deformities, is not always painful.  The more common form of arthritis is osteoarthritis. It is a wear-and-tear form of arthritis. It usually does not produce deformity of the joints or destruction of the bony tissues. Rather the ligaments weaken. They may be calcified due to the body's attempt to heal the damage. The larger joints of the body and those joints that take the most stress and strain are the most often affected. In the neck region this osteoarthritis usually involves the fifth, sixth  and seventh vertebrae. This is because the effects of posture produce the most fatigue on them. Osteoarthritis is often more painful than rheumatoid arthritis.  During workups for arthritis, a test evaluating inflammation, (the sedimentation rate) often is performed. In rheumatoid arthritis, this test will usually be elevated. Other tests for inflammation may also be elevated. In patients with osteoarthritis, x-rays of the neck or jaw joints will show changes from "lipping" of the vertebrae. This is caused by calcium deposits in the ligaments. Or they may show narrowing of the space between the vertebrae, or spur formation (from calcium deposits). If severe, it may cause obstruction of the holes where the nerves pass from the spine to the body. In rheumatoid arthritis, dislocation of vertebrae may occur in the upper neck. CT scan and MRI in patients with osteoarthritis may show bulging of the discs that cushion the vertebrae. In the most severe cases, herniation of the discs may occur.  Headaches, felt as a pain in the neck, may be caused by arthritis if the first, second or third vertebrae are involved. This condition is due to the nerves that supply the scalp only originating from this area of the spine. Neck pain itself, whether alone or coupled with headaches, can involve any portion of the neck. If the jaw is involved,  the symptoms are similar to those of Temporomandibular Joint Syndrome (TMJ).  The progressive severity of rheumatoid arthritis may be slowed by a variety of potent medications. In osteoarthritis, its progression is not usually hindered by medication. The following may be helpful in slowing the advancement of the disorder:  Lifestyle adjustment.  Exercise.  Rest.  Weight loss. Medications, such as the nonsteroidal anti-inflammatory agents (NSAIDs), are useful. They may reduce the pain and improve the reduced motion which occurs in joints afflicted by arthritis. From some studies, the  use of acetaminophen appears to be as effective in controlling the pain of arthritis as the NSAIDs. Physical modalities may also be useful for arthritis. They include:  Heat.  Massage.  Exercise. But physical therapy must be prescribed by a caregiver, just as most medications for arthritis.  Document Released: 03/08/2004 Document Revised: 12/22/2013 Document Reviewed: 03/22/2014 [facility] Patient Information 2015 [place], [place]. This information is not intended to replace advice given to you by your health care provider. Make sure you discuss any questions you have with your health care provider.

## 2015-05-10 NOTE — ED Provider Notes (Signed)
CSN: [id number]     Arrival date & time 05/10/15  1226 History   First MD Initiated Contact with Patient 05/10/15 1305     Chief Complaint  Patient presents with  . Headache     (Consider location/radiation/quality/duration/timing/severity/associated sxs/prior Treatment) HPI The patient poor she's had a headache on the left side of her head for about 2-3 weeks. She states the headache is constant and daily. She reports that since severe and sharp. She denies problems with frequent headaches prior to the onset of this. She has not had fevers or chills. There is no neck stiffness however she reports the pain does radiate down the back of her neck on the left somewhat. She states she has had a history of severe degenerative disease in her spine and had one surgery and was recommended to have another one. She does feel that her vision has been slightly blurred. There is no eye pain or difficulty with vision. There is no visual loss. There is no focal weakness numbness or tingling. There is no gait dysfunction. She has not had cognitive or speech problems in association. The patient initially tried allergy medicine of Claritin but has not been getting relief. She denies any history of similar headaches with sinus symptoms. Past Medical History  Diagnosis Date  . Hypertension   . Gout, unspecified 1980's    "not anymore" (03/09/2014)  . Mild mitral valve stenosis   . Gangrene     left fifth toe  . Peripheral vascular disease   . Hyperlipidemia   . Aortic valve sclerosis   . Shoulder fracture, right 2012  . Blood transfusion 1960's  . Chronic back pain   . Other specified cardiac dysrhythmias(427.89)     sees Dr. Angelena Form  . Heart murmur     "I think so" (11/25/2012)  . Anginal pain   . Anemia   . Exertional shortness of breath   . Asthma     "used to; not anymore" (03/09/2014)  . Pneumonia     "once; years ago" (03/09/2014)  . Type II diabetes mellitus     "not on any medication at this  time" (03/09/2014)  . GERD (gastroesophageal reflux disease)     hx (03/09/2014)  . Arthritis     "all over" (03/09/2014)  . ESRD (end stage renal disease) on dialysis     M, W, [facility] dialysis (03/09/2014)   Past Surgical History  Procedure Laterality Date  . Av fistula repair Bilateral     "right/left arm fistula failed; removed left thigh d/t poor circulation" (11/25/2012)  . Arteriovenous graft placement Left     femoral loop arteriovenous Gore-Tex graft.  . Av fistula placement  2008- 2013    "left upper arm; twice in my neck; left leg; removed from left leg; right upper arm" (11/25/2012)  . Foot amputation through metatarsal Left 06/22/11    "whole 5th toe" (11/25/2012)  . Pr vein bypass graft,aorto-fem-pop  06/14/2011  . Femoral-popliteal bypass graft  04/11/2012    Procedure: BYPASS GRAFT FEMORAL-POPLITEAL ARTERY;  Surgeon: [name], MD;  Location: [place];  Service: Vascular;  Laterality: Left;  Thrombectomy/Left femoral-popliteal bypass with revision of proximal end and shortening of graft; intraoperative arteriogram; endarterectomy and patch angioplasty with distal anastomosis  . Appendectomy  1960's  . Femoral-popliteal bypass graft  10/09/2012    Procedure: BYPASS GRAFT FEMORAL-POPLITEAL ARTERY;  Surgeon: [name], MD;  Location: [place];  Service: Vascular;  Laterality: Left;  Redo left  tibioperoneal trunk bypass with Gortex Graft 81mmx80cm.  . Total abdominal hysterectomy  1960's    one ovary removed  . Kyphoplasty  11/25/2012    Procedure: KYPHOPLASTY;  Surgeon: Kristeen Miss, MD;  Location: [place] [facility];  Service: Neurosurgery;  Laterality: N/A;  Lumbar two lumbar five Kyphoplasty  . Colonoscopy  2014?  Marland Kitchen Cataract extraction, bilateral Bilateral   . Exchange of a dialysis catheter Right 06/11/2013    Procedure: EXCHANGE OF A DIALYSIS CATHETER;  Surgeon: Conrad Huetter, MD;  Location: [place];  Service: Vascular;  Laterality: Right;  . Fistulogram Right  06/11/2013    Procedure: Venogram with angioplasty;  Surgeon: Conrad Beattie, MD;  Location: [place];  Service: Vascular;  Laterality: Right;  RIGHT CENTRAL VENOGRAM WITH ANGIOPLASTY  . Esophagogastroduodenoscopy N/A 09/10/2013    Procedure: ESOPHAGOGASTRODUODENOSCOPY (EGD);  Surgeon: [name], MD;  Location: [name];  Service: Endoscopy;  Laterality: N/A;  . Eye surgery    . Av fistula placement Right 03/09/2014    Procedure: RIGHT AXILLARY EXPLORATION; PARTIAL REMOVOAL OF OLD ARTERIOVENOUS (AV) GORE-TEX GRAFT; LIGATION OF RIGHT AXILLARY VEIN; ULTRASOUND GUIDED;  Surgeon: Conrad Ricketts, MD;  Location: [place];  Service: Vascular;  Laterality: Right;  . Insertion of dialysis catheter Right 03/10/2014    Procedure: INSERTION OF TUNNELED  DIALYSIS CATHETER -attempted  RIGHT SUBCLAVIAN, RIGHT FEMORAL TUNNELED DIALYSIS CATHETER EXCHANGE with Inferior Vena Cava gram and Fibrin Sheath Angioplasty;  Surgeon: Conrad Gateway, MD;  [facility];  Service: Vascular;  Laterality: Right;   Family History  Problem Relation Age of Onset  . Peripheral vascular disease Mother     amputation  . Hypertension Mother   . COPD Sister   . Heart attack Sister    History  Substance Use Topics  . Smoking status: Former Smoker -- 0.12 packs/day for 15 years    Types: Cigarettes  . Smokeless tobacco: Never Used     Comment: 03/09/2014 "quit smoking cigarettes in the 1990's"  . Alcohol Use: No     Comment: hx of abuse stopped 1990's   OB History    No data available     Review of Systems 10 Systems reviewed and are negative for acute change except as noted in the HPI.    Allergies  Ace inhibitors  Home Medications   Prior to Admission medications   Medication Sig Start Date End Date Taking? Authorizing Provider  calcium acetate (PHOSLO) 667 MG capsule Take 667 mg by mouth 3 (three) times daily with meals. 05/03/15  Yes Historical Provider, MD  cloNIDine (CATAPRES) 0.2 MG tablet Take 0.2 mg by mouth  2 (two) times daily.  03/09/13  Yes Historical Provider, MD  metoprolol succinate (TOPROL-XL) 50 MG 24 hr tablet Take 50 mg by mouth daily. Take with or immediately following a meal. 04/15/12  Yes Samantha J Rhyne, PA-C  pantoprazole (PROTONIX) 40 MG tablet Take 40 mg by mouth 2 (two) times daily.   Yes Historical Provider, MD  amoxicillin-clavulanate (AUGMENTIN) 875-125 MG per tablet Take 1 tablet by mouth 2 (two) times daily. One po bid x 7 days 05/10/15   Charlesetta Shanks, MD  loratadine (CLARITIN) 10 MG tablet Take 1 tablet (10 mg total) by mouth daily. One po daily x 5 days 05/10/15   Charlesetta Shanks, MD  ondansetron (ZOFRAN) 4 MG tablet Take 1 tablet (4 mg total) by mouth every 8 (eight) hours as needed for nausea or vomiting. Patient not taking: Reported on 05/10/2015  03/11/14   Ulyses Amor, PA-C  oxyCODONE-acetaminophen (PERCOCET) 5-325 MG per tablet Take 1-2 tablets by mouth every 4 (four) hours as needed. 05/10/15   Charlesetta Shanks, MD   BP 174/83 mmHg  Pulse 57  Temp(Src) 98.8 F (37.1 C) (Oral)  Resp 15  SpO2 98% Physical Exam  Constitutional: She is oriented to person, place, and time. She appears well-developed and well-nourished.  HENT:  Head: Normocephalic and atraumatic.  Poor dentition with several teeth having carries at the root level. There is no area of drainage or gingivitis however patient does not endorse tenderness palpation over the zygomatic arch or the mandible.  Eyes: EOM are normal. Pupils are equal, round, and reactive to light. Right eye exhibits no discharge. Left eye exhibits no discharge.  Neck: Neck supple.  The neck is supple without masses or meningismus.  Cardiovascular: Normal rate, regular rhythm, normal heart sounds and intact distal pulses.   Pulmonary/Chest: Effort normal and breath sounds normal.  Abdominal: Soft. Bowel sounds are normal. She exhibits no distension. There is no tenderness.  Musculoskeletal: Normal range of motion. She exhibits no edema.   Neurological: She is alert and oriented to person, place, and time. She has normal strength. No cranial nerve deficit. She exhibits normal muscle tone. Coordination normal. GCS eye subscore is 4. GCS verbal subscore is 5. GCS motor subscore is 6.  Normal finger-nose examination bilaterally.  Skin: Skin is warm, dry and intact.  Psychiatric: She has a normal mood and affect.    ED Course  Procedures (including critical care time) Labs Review Labs Reviewed  BASIC METABOLIC PANEL - Abnormal; Notable for the following:    Potassium 5.4 (*)    Glucose, Bld 120 (*)    BUN 52 (*)    Creatinine, Ser 8.97 (*)    Calcium 10.5 (*)    GFR calc non Af Amer 4 (*)    GFR calc Af Amer 4 (*)    All other components within normal limits  CBC WITH DIFFERENTIAL/PLATELET - Abnormal; Notable for the following:    RBC 3.62 (*)    Hemoglobin 10.9 (*)    HCT 35.3 (*)    Platelets 135 (*)    All other components within normal limits  PROTIME-INR  SEDIMENTATION RATE    Imaging Review Ct Head Wo Contrast  05/10/2015   CLINICAL DATA:  Headache and facial pain on left side for 2 weeks  EXAM: CT HEAD WITHOUT CONTRAST  CT MAXILLOFACIAL WITHOUT CONTRAST  TECHNIQUE: Multidetector CT imaging of the head and maxillofacial structures were performed using the standard protocol without intravenous contrast. Multiplanar CT image reconstructions of the maxillofacial structures were also generated.  COMPARISON:  Head CT December 17, 2014  FINDINGS: CT HEAD FINDINGS  There is mild diffuse atrophy. There is no intracranial mass, hemorrhage, extra-axial fluid collection, or midline shift. There is patchy small vessel disease in the centra semiovale bilaterally. There is no new gray-white compartment lesion. No acute appearing infarct. Bony calvarium appears intact. The mastoid air cells are clear.  CT MAXILLOFACIAL FINDINGS  There is no demonstrable fracture or dislocation. Orbits appear symmetric and normal bilaterally. There  is mucosal thickening in several ethmoid air cells bilaterally, primarily anteriorly than on the left. The frontal sinuses are hypoplastic. There is slight superior maxillary sinus mucosal thickening bilaterally.  There is no air-fluid level. No bony destruction or expansion. The ostiomeatal unit complexes are patent bilaterally. There is edema of the inferior nasal turbinates bilaterally. There is  mild leftward deviation of the nasal septum.  Salivary glands appear symmetric and normal bilaterally. No adenopathy appreciable.  There is extensive degenerative type change in the visualized cervical spine. In particular, there are multiple large anterior osteophytes in the upper to mid cervical region.  IMPRESSION: CT head: Mild atrophy with mild patchy periventricular small vessel disease. No intracranial mass, hemorrhage, or acute appearing infarct.  CT maxillofacial: Mild sinus disease. Moderate nasal turbinate edema inferiorly on both sides. No ostiomeatal unit complex obstruction. No air-fluid level. No bony destruction or expansion. No fracture or dislocation. Orbits appear symmetric and normal bilaterally. Multiple large anterior osteophytes in the upper to mid cervical spine region.   Electronically Signed   By: Lowella Grip III M.D.   On: 05/10/2015 16:11   Ct Maxillofacial Wo Cm  05/10/2015   CLINICAL DATA:  Headache and facial pain on left side for 2 weeks  EXAM: CT HEAD WITHOUT CONTRAST  CT MAXILLOFACIAL WITHOUT CONTRAST  TECHNIQUE: Multidetector CT imaging of the head and maxillofacial structures were performed using the standard protocol without intravenous contrast. Multiplanar CT image reconstructions of the maxillofacial structures were also generated.  COMPARISON:  Head CT December 17, 2014  FINDINGS: CT HEAD FINDINGS  There is mild diffuse atrophy. There is no intracranial mass, hemorrhage, extra-axial fluid collection, or midline shift. There is patchy small vessel disease in the centra  semiovale bilaterally. There is no new gray-white compartment lesion. No acute appearing infarct. Bony calvarium appears intact. The mastoid air cells are clear.  CT MAXILLOFACIAL FINDINGS  There is no demonstrable fracture or dislocation. Orbits appear symmetric and normal bilaterally. There is mucosal thickening in several ethmoid air cells bilaterally, primarily anteriorly than on the left. The frontal sinuses are hypoplastic. There is slight superior maxillary sinus mucosal thickening bilaterally.  There is no air-fluid level. No bony destruction or expansion. The ostiomeatal unit complexes are patent bilaterally. There is edema of the inferior nasal turbinates bilaterally. There is mild leftward deviation of the nasal septum.  Salivary glands appear symmetric and normal bilaterally. No adenopathy appreciable.  There is extensive degenerative type change in the visualized cervical spine. In particular, there are multiple large anterior osteophytes in the upper to mid cervical region.  IMPRESSION: CT head: Mild atrophy with mild patchy periventricular small vessel disease. No intracranial mass, hemorrhage, or acute appearing infarct.  CT maxillofacial: Mild sinus disease. Moderate nasal turbinate edema inferiorly on both sides. No ostiomeatal unit complex obstruction. No air-fluid level. No bony destruction or expansion. No fracture or dislocation. Orbits appear symmetric and normal bilaterally. Multiple large anterior osteophytes in the upper to mid cervical spine region.   Electronically Signed   By: Lowella Grip III M.D.   On: 05/10/2015 16:11     EKG Interpretation None      MDM   Final diagnoses:  Other headache syndrome  Acute ethmoidal sinusitis, recurrence not specified  Degenerative joint disease of cervical spine   At this time the patient has had a chronic daily headache for some 2-3 weeks duration. It was not of a acute or sudden onset. She has had some nasal drainage and has been  trying Claritin but the headache has been very persistent. The patient has had no associated neurologic symptoms. CT does not show any evidence of intracranial mass or bleeding. The sinuses do show some ethmoid sinusitis without any fluid levels. Based on the chronicity and laterality of the patient's headache with nasal drainage and discharge, I will treat with  Augmentin. The other possible etiology here of headache is degenerative joint disease. Patient identifies significant problems with spine degenerative joint disease and the CT does identify very large osteophytes on the cervical spine. She does note that there is pain in the neck which does not have any meningismus type quality to it. Her neck is supple to examination, without mass or nodule. The patient will try Percocet for pain and is advised to follow-up with her family doctor this week for reassessment.    Charlesetta Shanks, MD 05/10/15 [phone]

## 2015-05-10 NOTE — ED Notes (Signed)
A&O x 4 ambulatory pt c/o lt sided headache x 2 wks.  No slurred speech, steady gait.  Denies NVD.

## 2015-05-11 DIAGNOSIS — N186 End stage renal disease: Secondary | ICD-10-CM | POA: Diagnosis not present

## 2015-05-11 DIAGNOSIS — N2581 Secondary hyperparathyroidism of renal origin: Secondary | ICD-10-CM | POA: Diagnosis not present

## 2015-05-11 DIAGNOSIS — D631 Anemia in chronic kidney disease: Secondary | ICD-10-CM | POA: Diagnosis not present

## 2015-05-12 ENCOUNTER — Ambulatory Visit
Admission: RE | Admit: 2015-05-12 | Discharge: 2015-05-12 | Disposition: A | Discharge status: Home / Self Care | Payer: Medicare Other | Source: Ambulatory Visit | Attending: Pulmonary Disease | Admitting: Pulmonary Disease

## 2015-05-12 DIAGNOSIS — N6489 Other specified disorders of breast: Secondary | ICD-10-CM

## 2015-05-12 DIAGNOSIS — N6459 Other signs and symptoms in breast: Secondary | ICD-10-CM | POA: Diagnosis not present

## 2015-05-13 ENCOUNTER — Other Ambulatory Visit: Payer: Medicare Other

## 2015-05-13 DIAGNOSIS — N2581 Secondary hyperparathyroidism of renal origin: Secondary | ICD-10-CM | POA: Diagnosis not present

## 2015-05-13 DIAGNOSIS — N186 End stage renal disease: Secondary | ICD-10-CM | POA: Diagnosis not present

## 2015-05-13 DIAGNOSIS — D631 Anemia in chronic kidney disease: Secondary | ICD-10-CM | POA: Diagnosis not present

## 2015-05-16 DIAGNOSIS — N186 End stage renal disease: Secondary | ICD-10-CM | POA: Diagnosis not present

## 2015-05-16 DIAGNOSIS — N2581 Secondary hyperparathyroidism of renal origin: Secondary | ICD-10-CM | POA: Diagnosis not present

## 2015-05-16 DIAGNOSIS — D631 Anemia in chronic kidney disease: Secondary | ICD-10-CM | POA: Diagnosis not present

## 2015-05-18 DIAGNOSIS — N2581 Secondary hyperparathyroidism of renal origin: Secondary | ICD-10-CM | POA: Diagnosis not present

## 2015-05-18 DIAGNOSIS — D631 Anemia in chronic kidney disease: Secondary | ICD-10-CM | POA: Diagnosis not present

## 2015-05-18 DIAGNOSIS — N186 End stage renal disease: Secondary | ICD-10-CM | POA: Diagnosis not present

## 2015-05-20 DIAGNOSIS — N2581 Secondary hyperparathyroidism of renal origin: Secondary | ICD-10-CM | POA: Diagnosis not present

## 2015-05-20 DIAGNOSIS — N186 End stage renal disease: Secondary | ICD-10-CM | POA: Diagnosis not present

## 2015-05-20 DIAGNOSIS — D631 Anemia in chronic kidney disease: Secondary | ICD-10-CM | POA: Diagnosis not present

## 2015-05-23 DIAGNOSIS — N2581 Secondary hyperparathyroidism of renal origin: Secondary | ICD-10-CM | POA: Diagnosis not present

## 2015-05-23 DIAGNOSIS — N186 End stage renal disease: Secondary | ICD-10-CM | POA: Diagnosis not present

## 2015-05-23 DIAGNOSIS — D631 Anemia in chronic kidney disease: Secondary | ICD-10-CM | POA: Diagnosis not present

## 2015-05-25 DIAGNOSIS — N186 End stage renal disease: Secondary | ICD-10-CM | POA: Diagnosis not present

## 2015-05-25 DIAGNOSIS — N2581 Secondary hyperparathyroidism of renal origin: Secondary | ICD-10-CM | POA: Diagnosis not present

## 2015-05-25 DIAGNOSIS — D631 Anemia in chronic kidney disease: Secondary | ICD-10-CM | POA: Diagnosis not present

## 2015-05-27 DIAGNOSIS — N2581 Secondary hyperparathyroidism of renal origin: Secondary | ICD-10-CM | POA: Diagnosis not present

## 2015-05-27 DIAGNOSIS — N186 End stage renal disease: Secondary | ICD-10-CM | POA: Diagnosis not present

## 2015-05-27 DIAGNOSIS — D631 Anemia in chronic kidney disease: Secondary | ICD-10-CM | POA: Diagnosis not present

## 2015-05-30 DIAGNOSIS — N186 End stage renal disease: Secondary | ICD-10-CM | POA: Diagnosis not present

## 2015-05-30 DIAGNOSIS — N2581 Secondary hyperparathyroidism of renal origin: Secondary | ICD-10-CM | POA: Diagnosis not present

## 2015-05-30 DIAGNOSIS — D631 Anemia in chronic kidney disease: Secondary | ICD-10-CM | POA: Diagnosis not present

## 2015-05-31 DIAGNOSIS — Z992 Dependence on renal dialysis: Secondary | ICD-10-CM | POA: Diagnosis not present

## 2015-05-31 DIAGNOSIS — I120 Hypertensive chronic kidney disease with stage 5 chronic kidney disease or end stage renal disease: Secondary | ICD-10-CM | POA: Diagnosis not present

## 2015-05-31 DIAGNOSIS — N186 End stage renal disease: Secondary | ICD-10-CM | POA: Diagnosis not present

## 2015-06-01 DIAGNOSIS — N2581 Secondary hyperparathyroidism of renal origin: Secondary | ICD-10-CM | POA: Diagnosis not present

## 2015-06-01 DIAGNOSIS — N186 End stage renal disease: Secondary | ICD-10-CM | POA: Diagnosis not present

## 2015-06-01 DIAGNOSIS — E1129 Type 2 diabetes mellitus with other diabetic kidney complication: Secondary | ICD-10-CM | POA: Diagnosis not present

## 2015-06-01 DIAGNOSIS — D631 Anemia in chronic kidney disease: Secondary | ICD-10-CM | POA: Diagnosis not present

## 2015-06-03 DIAGNOSIS — N2581 Secondary hyperparathyroidism of renal origin: Secondary | ICD-10-CM | POA: Diagnosis not present

## 2015-06-03 DIAGNOSIS — D631 Anemia in chronic kidney disease: Secondary | ICD-10-CM | POA: Diagnosis not present

## 2015-06-03 DIAGNOSIS — E1129 Type 2 diabetes mellitus with other diabetic kidney complication: Secondary | ICD-10-CM | POA: Diagnosis not present

## 2015-06-03 DIAGNOSIS — N186 End stage renal disease: Secondary | ICD-10-CM | POA: Diagnosis not present

## 2015-06-06 DIAGNOSIS — E1129 Type 2 diabetes mellitus with other diabetic kidney complication: Secondary | ICD-10-CM | POA: Diagnosis not present

## 2015-06-06 DIAGNOSIS — D631 Anemia in chronic kidney disease: Secondary | ICD-10-CM | POA: Diagnosis not present

## 2015-06-06 DIAGNOSIS — N2581 Secondary hyperparathyroidism of renal origin: Secondary | ICD-10-CM | POA: Diagnosis not present

## 2015-06-06 DIAGNOSIS — N186 End stage renal disease: Secondary | ICD-10-CM | POA: Diagnosis not present

## 2015-06-08 DIAGNOSIS — N2581 Secondary hyperparathyroidism of renal origin: Secondary | ICD-10-CM | POA: Diagnosis not present

## 2015-06-08 DIAGNOSIS — E1129 Type 2 diabetes mellitus with other diabetic kidney complication: Secondary | ICD-10-CM | POA: Diagnosis not present

## 2015-06-08 DIAGNOSIS — N186 End stage renal disease: Secondary | ICD-10-CM | POA: Diagnosis not present

## 2015-06-08 DIAGNOSIS — D631 Anemia in chronic kidney disease: Secondary | ICD-10-CM | POA: Diagnosis not present

## 2015-06-10 DIAGNOSIS — E1129 Type 2 diabetes mellitus with other diabetic kidney complication: Secondary | ICD-10-CM | POA: Diagnosis not present

## 2015-06-10 DIAGNOSIS — N2581 Secondary hyperparathyroidism of renal origin: Secondary | ICD-10-CM | POA: Diagnosis not present

## 2015-06-10 DIAGNOSIS — N186 End stage renal disease: Secondary | ICD-10-CM | POA: Diagnosis not present

## 2015-06-10 DIAGNOSIS — D631 Anemia in chronic kidney disease: Secondary | ICD-10-CM | POA: Diagnosis not present

## 2015-06-13 DIAGNOSIS — E1129 Type 2 diabetes mellitus with other diabetic kidney complication: Secondary | ICD-10-CM | POA: Diagnosis not present

## 2015-06-13 DIAGNOSIS — D631 Anemia in chronic kidney disease: Secondary | ICD-10-CM | POA: Diagnosis not present

## 2015-06-13 DIAGNOSIS — N2581 Secondary hyperparathyroidism of renal origin: Secondary | ICD-10-CM | POA: Diagnosis not present

## 2015-06-13 DIAGNOSIS — N186 End stage renal disease: Secondary | ICD-10-CM | POA: Diagnosis not present

## 2015-06-15 DIAGNOSIS — N2581 Secondary hyperparathyroidism of renal origin: Secondary | ICD-10-CM | POA: Diagnosis not present

## 2015-06-15 DIAGNOSIS — D631 Anemia in chronic kidney disease: Secondary | ICD-10-CM | POA: Diagnosis not present

## 2015-06-15 DIAGNOSIS — E1129 Type 2 diabetes mellitus with other diabetic kidney complication: Secondary | ICD-10-CM | POA: Diagnosis not present

## 2015-06-15 DIAGNOSIS — N186 End stage renal disease: Secondary | ICD-10-CM | POA: Diagnosis not present

## 2015-06-17 DIAGNOSIS — N186 End stage renal disease: Secondary | ICD-10-CM | POA: Diagnosis not present

## 2015-06-17 DIAGNOSIS — N2581 Secondary hyperparathyroidism of renal origin: Secondary | ICD-10-CM | POA: Diagnosis not present

## 2015-06-17 DIAGNOSIS — D631 Anemia in chronic kidney disease: Secondary | ICD-10-CM | POA: Diagnosis not present

## 2015-06-17 DIAGNOSIS — E1129 Type 2 diabetes mellitus with other diabetic kidney complication: Secondary | ICD-10-CM | POA: Diagnosis not present

## 2015-06-20 DIAGNOSIS — N2581 Secondary hyperparathyroidism of renal origin: Secondary | ICD-10-CM | POA: Diagnosis not present

## 2015-06-20 DIAGNOSIS — N186 End stage renal disease: Secondary | ICD-10-CM | POA: Diagnosis not present

## 2015-06-20 DIAGNOSIS — E1129 Type 2 diabetes mellitus with other diabetic kidney complication: Secondary | ICD-10-CM | POA: Diagnosis not present

## 2015-06-20 DIAGNOSIS — D631 Anemia in chronic kidney disease: Secondary | ICD-10-CM | POA: Diagnosis not present

## 2015-06-22 DIAGNOSIS — N186 End stage renal disease: Secondary | ICD-10-CM | POA: Diagnosis not present

## 2015-06-22 DIAGNOSIS — N2581 Secondary hyperparathyroidism of renal origin: Secondary | ICD-10-CM | POA: Diagnosis not present

## 2015-06-22 DIAGNOSIS — D631 Anemia in chronic kidney disease: Secondary | ICD-10-CM | POA: Diagnosis not present

## 2015-06-22 DIAGNOSIS — E1129 Type 2 diabetes mellitus with other diabetic kidney complication: Secondary | ICD-10-CM | POA: Diagnosis not present

## 2015-06-24 DIAGNOSIS — N186 End stage renal disease: Secondary | ICD-10-CM | POA: Diagnosis not present

## 2015-06-24 DIAGNOSIS — N2581 Secondary hyperparathyroidism of renal origin: Secondary | ICD-10-CM | POA: Diagnosis not present

## 2015-06-24 DIAGNOSIS — D631 Anemia in chronic kidney disease: Secondary | ICD-10-CM | POA: Diagnosis not present

## 2015-06-24 DIAGNOSIS — E1129 Type 2 diabetes mellitus with other diabetic kidney complication: Secondary | ICD-10-CM | POA: Diagnosis not present

## 2015-06-27 DIAGNOSIS — N186 End stage renal disease: Secondary | ICD-10-CM | POA: Diagnosis not present

## 2015-06-27 DIAGNOSIS — E1129 Type 2 diabetes mellitus with other diabetic kidney complication: Secondary | ICD-10-CM | POA: Diagnosis not present

## 2015-06-27 DIAGNOSIS — D631 Anemia in chronic kidney disease: Secondary | ICD-10-CM | POA: Diagnosis not present

## 2015-06-27 DIAGNOSIS — N2581 Secondary hyperparathyroidism of renal origin: Secondary | ICD-10-CM | POA: Diagnosis not present

## 2015-06-29 DIAGNOSIS — D631 Anemia in chronic kidney disease: Secondary | ICD-10-CM | POA: Diagnosis not present

## 2015-06-29 DIAGNOSIS — E1129 Type 2 diabetes mellitus with other diabetic kidney complication: Secondary | ICD-10-CM | POA: Diagnosis not present

## 2015-06-29 DIAGNOSIS — N2581 Secondary hyperparathyroidism of renal origin: Secondary | ICD-10-CM | POA: Diagnosis not present

## 2015-06-29 DIAGNOSIS — N186 End stage renal disease: Secondary | ICD-10-CM | POA: Diagnosis not present

## 2015-06-30 DIAGNOSIS — I120 Hypertensive chronic kidney disease with stage 5 chronic kidney disease or end stage renal disease: Secondary | ICD-10-CM | POA: Diagnosis not present

## 2015-06-30 DIAGNOSIS — N186 End stage renal disease: Secondary | ICD-10-CM | POA: Diagnosis not present

## 2015-06-30 DIAGNOSIS — Z992 Dependence on renal dialysis: Secondary | ICD-10-CM | POA: Diagnosis not present

## 2015-07-01 DIAGNOSIS — N186 End stage renal disease: Secondary | ICD-10-CM | POA: Diagnosis not present

## 2015-07-01 DIAGNOSIS — N2581 Secondary hyperparathyroidism of renal origin: Secondary | ICD-10-CM | POA: Diagnosis not present

## 2015-07-01 DIAGNOSIS — D631 Anemia in chronic kidney disease: Secondary | ICD-10-CM | POA: Diagnosis not present

## 2015-07-01 DIAGNOSIS — E1129 Type 2 diabetes mellitus with other diabetic kidney complication: Secondary | ICD-10-CM | POA: Diagnosis not present

## 2015-07-04 DIAGNOSIS — N186 End stage renal disease: Secondary | ICD-10-CM | POA: Diagnosis not present

## 2015-07-04 DIAGNOSIS — D631 Anemia in chronic kidney disease: Secondary | ICD-10-CM | POA: Diagnosis not present

## 2015-07-04 DIAGNOSIS — E1129 Type 2 diabetes mellitus with other diabetic kidney complication: Secondary | ICD-10-CM | POA: Diagnosis not present

## 2015-07-04 DIAGNOSIS — N2581 Secondary hyperparathyroidism of renal origin: Secondary | ICD-10-CM | POA: Diagnosis not present

## 2015-07-06 DIAGNOSIS — N2581 Secondary hyperparathyroidism of renal origin: Secondary | ICD-10-CM | POA: Diagnosis not present

## 2015-07-06 DIAGNOSIS — D631 Anemia in chronic kidney disease: Secondary | ICD-10-CM | POA: Diagnosis not present

## 2015-07-06 DIAGNOSIS — N186 End stage renal disease: Secondary | ICD-10-CM | POA: Diagnosis not present

## 2015-07-06 DIAGNOSIS — E1129 Type 2 diabetes mellitus with other diabetic kidney complication: Secondary | ICD-10-CM | POA: Diagnosis not present

## 2015-07-07 DIAGNOSIS — H6123 Impacted cerumen, bilateral: Secondary | ICD-10-CM | POA: Diagnosis not present

## 2015-07-08 DIAGNOSIS — N186 End stage renal disease: Secondary | ICD-10-CM | POA: Diagnosis not present

## 2015-07-08 DIAGNOSIS — E1129 Type 2 diabetes mellitus with other diabetic kidney complication: Secondary | ICD-10-CM | POA: Diagnosis not present

## 2015-07-08 DIAGNOSIS — N2581 Secondary hyperparathyroidism of renal origin: Secondary | ICD-10-CM | POA: Diagnosis not present

## 2015-07-08 DIAGNOSIS — D631 Anemia in chronic kidney disease: Secondary | ICD-10-CM | POA: Diagnosis not present

## 2015-07-11 DIAGNOSIS — E1129 Type 2 diabetes mellitus with other diabetic kidney complication: Secondary | ICD-10-CM | POA: Diagnosis not present

## 2015-07-11 DIAGNOSIS — N2581 Secondary hyperparathyroidism of renal origin: Secondary | ICD-10-CM | POA: Diagnosis not present

## 2015-07-11 DIAGNOSIS — N186 End stage renal disease: Secondary | ICD-10-CM | POA: Diagnosis not present

## 2015-07-11 DIAGNOSIS — D631 Anemia in chronic kidney disease: Secondary | ICD-10-CM | POA: Diagnosis not present

## 2015-07-13 DIAGNOSIS — N2581 Secondary hyperparathyroidism of renal origin: Secondary | ICD-10-CM | POA: Diagnosis not present

## 2015-07-13 DIAGNOSIS — E1129 Type 2 diabetes mellitus with other diabetic kidney complication: Secondary | ICD-10-CM | POA: Diagnosis not present

## 2015-07-13 DIAGNOSIS — D631 Anemia in chronic kidney disease: Secondary | ICD-10-CM | POA: Diagnosis not present

## 2015-07-13 DIAGNOSIS — N186 End stage renal disease: Secondary | ICD-10-CM | POA: Diagnosis not present

## 2015-07-15 DIAGNOSIS — N2581 Secondary hyperparathyroidism of renal origin: Secondary | ICD-10-CM | POA: Diagnosis not present

## 2015-07-15 DIAGNOSIS — D631 Anemia in chronic kidney disease: Secondary | ICD-10-CM | POA: Diagnosis not present

## 2015-07-15 DIAGNOSIS — E1129 Type 2 diabetes mellitus with other diabetic kidney complication: Secondary | ICD-10-CM | POA: Diagnosis not present

## 2015-07-15 DIAGNOSIS — N186 End stage renal disease: Secondary | ICD-10-CM | POA: Diagnosis not present

## 2015-07-18 DIAGNOSIS — N2581 Secondary hyperparathyroidism of renal origin: Secondary | ICD-10-CM | POA: Diagnosis not present

## 2015-07-18 DIAGNOSIS — E1129 Type 2 diabetes mellitus with other diabetic kidney complication: Secondary | ICD-10-CM | POA: Diagnosis not present

## 2015-07-18 DIAGNOSIS — D631 Anemia in chronic kidney disease: Secondary | ICD-10-CM | POA: Diagnosis not present

## 2015-07-18 DIAGNOSIS — N186 End stage renal disease: Secondary | ICD-10-CM | POA: Diagnosis not present

## 2015-07-20 DIAGNOSIS — N186 End stage renal disease: Secondary | ICD-10-CM | POA: Diagnosis not present

## 2015-07-20 DIAGNOSIS — N2581 Secondary hyperparathyroidism of renal origin: Secondary | ICD-10-CM | POA: Diagnosis not present

## 2015-07-20 DIAGNOSIS — D631 Anemia in chronic kidney disease: Secondary | ICD-10-CM | POA: Diagnosis not present

## 2015-07-20 DIAGNOSIS — E1129 Type 2 diabetes mellitus with other diabetic kidney complication: Secondary | ICD-10-CM | POA: Diagnosis not present

## 2015-07-22 DIAGNOSIS — N186 End stage renal disease: Secondary | ICD-10-CM | POA: Diagnosis not present

## 2015-07-22 DIAGNOSIS — E1129 Type 2 diabetes mellitus with other diabetic kidney complication: Secondary | ICD-10-CM | POA: Diagnosis not present

## 2015-07-22 DIAGNOSIS — D631 Anemia in chronic kidney disease: Secondary | ICD-10-CM | POA: Diagnosis not present

## 2015-07-22 DIAGNOSIS — N2581 Secondary hyperparathyroidism of renal origin: Secondary | ICD-10-CM | POA: Diagnosis not present

## 2015-07-25 DIAGNOSIS — N2581 Secondary hyperparathyroidism of renal origin: Secondary | ICD-10-CM | POA: Diagnosis not present

## 2015-07-25 DIAGNOSIS — N186 End stage renal disease: Secondary | ICD-10-CM | POA: Diagnosis not present

## 2015-07-25 DIAGNOSIS — E1129 Type 2 diabetes mellitus with other diabetic kidney complication: Secondary | ICD-10-CM | POA: Diagnosis not present

## 2015-07-25 DIAGNOSIS — D631 Anemia in chronic kidney disease: Secondary | ICD-10-CM | POA: Diagnosis not present

## 2015-07-27 DIAGNOSIS — E1129 Type 2 diabetes mellitus with other diabetic kidney complication: Secondary | ICD-10-CM | POA: Diagnosis not present

## 2015-07-27 DIAGNOSIS — N186 End stage renal disease: Secondary | ICD-10-CM | POA: Diagnosis not present

## 2015-07-27 DIAGNOSIS — D631 Anemia in chronic kidney disease: Secondary | ICD-10-CM | POA: Diagnosis not present

## 2015-07-27 DIAGNOSIS — N2581 Secondary hyperparathyroidism of renal origin: Secondary | ICD-10-CM | POA: Diagnosis not present

## 2015-07-29 DIAGNOSIS — E1129 Type 2 diabetes mellitus with other diabetic kidney complication: Secondary | ICD-10-CM | POA: Diagnosis not present

## 2015-07-29 DIAGNOSIS — N2581 Secondary hyperparathyroidism of renal origin: Secondary | ICD-10-CM | POA: Diagnosis not present

## 2015-07-29 DIAGNOSIS — D631 Anemia in chronic kidney disease: Secondary | ICD-10-CM | POA: Diagnosis not present

## 2015-07-29 DIAGNOSIS — N186 End stage renal disease: Secondary | ICD-10-CM | POA: Diagnosis not present

## 2015-07-31 DIAGNOSIS — Z992 Dependence on renal dialysis: Secondary | ICD-10-CM | POA: Diagnosis not present

## 2015-07-31 DIAGNOSIS — I120 Hypertensive chronic kidney disease with stage 5 chronic kidney disease or end stage renal disease: Secondary | ICD-10-CM | POA: Diagnosis not present

## 2015-07-31 DIAGNOSIS — N186 End stage renal disease: Secondary | ICD-10-CM | POA: Diagnosis not present

## 2015-08-01 DIAGNOSIS — D631 Anemia in chronic kidney disease: Secondary | ICD-10-CM | POA: Diagnosis not present

## 2015-08-01 DIAGNOSIS — E1129 Type 2 diabetes mellitus with other diabetic kidney complication: Secondary | ICD-10-CM | POA: Diagnosis not present

## 2015-08-01 DIAGNOSIS — N2581 Secondary hyperparathyroidism of renal origin: Secondary | ICD-10-CM | POA: Diagnosis not present

## 2015-08-01 DIAGNOSIS — N186 End stage renal disease: Secondary | ICD-10-CM | POA: Diagnosis not present

## 2015-08-01 DIAGNOSIS — D509 Iron deficiency anemia, unspecified: Secondary | ICD-10-CM | POA: Diagnosis not present

## 2015-08-03 DIAGNOSIS — D631 Anemia in chronic kidney disease: Secondary | ICD-10-CM | POA: Diagnosis not present

## 2015-08-03 DIAGNOSIS — D509 Iron deficiency anemia, unspecified: Secondary | ICD-10-CM | POA: Diagnosis not present

## 2015-08-03 DIAGNOSIS — E1129 Type 2 diabetes mellitus with other diabetic kidney complication: Secondary | ICD-10-CM | POA: Diagnosis not present

## 2015-08-03 DIAGNOSIS — N186 End stage renal disease: Secondary | ICD-10-CM | POA: Diagnosis not present

## 2015-08-03 DIAGNOSIS — N2581 Secondary hyperparathyroidism of renal origin: Secondary | ICD-10-CM | POA: Diagnosis not present

## 2015-08-05 DIAGNOSIS — D631 Anemia in chronic kidney disease: Secondary | ICD-10-CM | POA: Diagnosis not present

## 2015-08-05 DIAGNOSIS — E1129 Type 2 diabetes mellitus with other diabetic kidney complication: Secondary | ICD-10-CM | POA: Diagnosis not present

## 2015-08-05 DIAGNOSIS — N2581 Secondary hyperparathyroidism of renal origin: Secondary | ICD-10-CM | POA: Diagnosis not present

## 2015-08-05 DIAGNOSIS — D509 Iron deficiency anemia, unspecified: Secondary | ICD-10-CM | POA: Diagnosis not present

## 2015-08-05 DIAGNOSIS — N186 End stage renal disease: Secondary | ICD-10-CM | POA: Diagnosis not present

## 2015-08-08 DIAGNOSIS — D509 Iron deficiency anemia, unspecified: Secondary | ICD-10-CM | POA: Diagnosis not present

## 2015-08-08 DIAGNOSIS — E1129 Type 2 diabetes mellitus with other diabetic kidney complication: Secondary | ICD-10-CM | POA: Diagnosis not present

## 2015-08-08 DIAGNOSIS — D631 Anemia in chronic kidney disease: Secondary | ICD-10-CM | POA: Diagnosis not present

## 2015-08-08 DIAGNOSIS — N2581 Secondary hyperparathyroidism of renal origin: Secondary | ICD-10-CM | POA: Diagnosis not present

## 2015-08-08 DIAGNOSIS — N186 End stage renal disease: Secondary | ICD-10-CM | POA: Diagnosis not present

## 2015-08-10 DIAGNOSIS — N186 End stage renal disease: Secondary | ICD-10-CM | POA: Diagnosis not present

## 2015-08-10 DIAGNOSIS — D631 Anemia in chronic kidney disease: Secondary | ICD-10-CM | POA: Diagnosis not present

## 2015-08-10 DIAGNOSIS — N2581 Secondary hyperparathyroidism of renal origin: Secondary | ICD-10-CM | POA: Diagnosis not present

## 2015-08-10 DIAGNOSIS — E1129 Type 2 diabetes mellitus with other diabetic kidney complication: Secondary | ICD-10-CM | POA: Diagnosis not present

## 2015-08-10 DIAGNOSIS — D509 Iron deficiency anemia, unspecified: Secondary | ICD-10-CM | POA: Diagnosis not present

## 2015-08-12 DIAGNOSIS — D509 Iron deficiency anemia, unspecified: Secondary | ICD-10-CM | POA: Diagnosis not present

## 2015-08-12 DIAGNOSIS — D631 Anemia in chronic kidney disease: Secondary | ICD-10-CM | POA: Diagnosis not present

## 2015-08-12 DIAGNOSIS — N186 End stage renal disease: Secondary | ICD-10-CM | POA: Diagnosis not present

## 2015-08-12 DIAGNOSIS — N2581 Secondary hyperparathyroidism of renal origin: Secondary | ICD-10-CM | POA: Diagnosis not present

## 2015-08-12 DIAGNOSIS — E1129 Type 2 diabetes mellitus with other diabetic kidney complication: Secondary | ICD-10-CM | POA: Diagnosis not present

## 2015-08-15 DIAGNOSIS — N186 End stage renal disease: Secondary | ICD-10-CM | POA: Diagnosis not present

## 2015-08-15 DIAGNOSIS — N2581 Secondary hyperparathyroidism of renal origin: Secondary | ICD-10-CM | POA: Diagnosis not present

## 2015-08-15 DIAGNOSIS — E1129 Type 2 diabetes mellitus with other diabetic kidney complication: Secondary | ICD-10-CM | POA: Diagnosis not present

## 2015-08-15 DIAGNOSIS — D631 Anemia in chronic kidney disease: Secondary | ICD-10-CM | POA: Diagnosis not present

## 2015-08-15 DIAGNOSIS — D509 Iron deficiency anemia, unspecified: Secondary | ICD-10-CM | POA: Diagnosis not present

## 2015-08-17 DIAGNOSIS — N2581 Secondary hyperparathyroidism of renal origin: Secondary | ICD-10-CM | POA: Diagnosis not present

## 2015-08-17 DIAGNOSIS — D509 Iron deficiency anemia, unspecified: Secondary | ICD-10-CM | POA: Diagnosis not present

## 2015-08-17 DIAGNOSIS — D631 Anemia in chronic kidney disease: Secondary | ICD-10-CM | POA: Diagnosis not present

## 2015-08-17 DIAGNOSIS — E1129 Type 2 diabetes mellitus with other diabetic kidney complication: Secondary | ICD-10-CM | POA: Diagnosis not present

## 2015-08-17 DIAGNOSIS — N186 End stage renal disease: Secondary | ICD-10-CM | POA: Diagnosis not present

## 2015-08-19 DIAGNOSIS — N2581 Secondary hyperparathyroidism of renal origin: Secondary | ICD-10-CM | POA: Diagnosis not present

## 2015-08-19 DIAGNOSIS — E1129 Type 2 diabetes mellitus with other diabetic kidney complication: Secondary | ICD-10-CM | POA: Diagnosis not present

## 2015-08-19 DIAGNOSIS — N186 End stage renal disease: Secondary | ICD-10-CM | POA: Diagnosis not present

## 2015-08-19 DIAGNOSIS — D631 Anemia in chronic kidney disease: Secondary | ICD-10-CM | POA: Diagnosis not present

## 2015-08-19 DIAGNOSIS — D509 Iron deficiency anemia, unspecified: Secondary | ICD-10-CM | POA: Diagnosis not present

## 2015-08-22 DIAGNOSIS — E1129 Type 2 diabetes mellitus with other diabetic kidney complication: Secondary | ICD-10-CM | POA: Diagnosis not present

## 2015-08-22 DIAGNOSIS — D509 Iron deficiency anemia, unspecified: Secondary | ICD-10-CM | POA: Diagnosis not present

## 2015-08-22 DIAGNOSIS — N2581 Secondary hyperparathyroidism of renal origin: Secondary | ICD-10-CM | POA: Diagnosis not present

## 2015-08-22 DIAGNOSIS — D631 Anemia in chronic kidney disease: Secondary | ICD-10-CM | POA: Diagnosis not present

## 2015-08-22 DIAGNOSIS — N186 End stage renal disease: Secondary | ICD-10-CM | POA: Diagnosis not present

## 2015-08-24 DIAGNOSIS — N186 End stage renal disease: Secondary | ICD-10-CM | POA: Diagnosis not present

## 2015-08-24 DIAGNOSIS — D631 Anemia in chronic kidney disease: Secondary | ICD-10-CM | POA: Diagnosis not present

## 2015-08-24 DIAGNOSIS — E1129 Type 2 diabetes mellitus with other diabetic kidney complication: Secondary | ICD-10-CM | POA: Diagnosis not present

## 2015-08-24 DIAGNOSIS — N2581 Secondary hyperparathyroidism of renal origin: Secondary | ICD-10-CM | POA: Diagnosis not present

## 2015-08-24 DIAGNOSIS — D509 Iron deficiency anemia, unspecified: Secondary | ICD-10-CM | POA: Diagnosis not present

## 2015-08-26 DIAGNOSIS — D509 Iron deficiency anemia, unspecified: Secondary | ICD-10-CM | POA: Diagnosis not present

## 2015-08-26 DIAGNOSIS — N2581 Secondary hyperparathyroidism of renal origin: Secondary | ICD-10-CM | POA: Diagnosis not present

## 2015-08-26 DIAGNOSIS — D631 Anemia in chronic kidney disease: Secondary | ICD-10-CM | POA: Diagnosis not present

## 2015-08-26 DIAGNOSIS — E1129 Type 2 diabetes mellitus with other diabetic kidney complication: Secondary | ICD-10-CM | POA: Diagnosis not present

## 2015-08-26 DIAGNOSIS — N186 End stage renal disease: Secondary | ICD-10-CM | POA: Diagnosis not present

## 2015-08-29 DIAGNOSIS — N2581 Secondary hyperparathyroidism of renal origin: Secondary | ICD-10-CM | POA: Diagnosis not present

## 2015-08-29 DIAGNOSIS — D631 Anemia in chronic kidney disease: Secondary | ICD-10-CM | POA: Diagnosis not present

## 2015-08-29 DIAGNOSIS — E1129 Type 2 diabetes mellitus with other diabetic kidney complication: Secondary | ICD-10-CM | POA: Diagnosis not present

## 2015-08-29 DIAGNOSIS — D509 Iron deficiency anemia, unspecified: Secondary | ICD-10-CM | POA: Diagnosis not present

## 2015-08-29 DIAGNOSIS — N186 End stage renal disease: Secondary | ICD-10-CM | POA: Diagnosis not present

## 2015-08-31 DIAGNOSIS — D509 Iron deficiency anemia, unspecified: Secondary | ICD-10-CM | POA: Diagnosis not present

## 2015-08-31 DIAGNOSIS — N2581 Secondary hyperparathyroidism of renal origin: Secondary | ICD-10-CM | POA: Diagnosis not present

## 2015-08-31 DIAGNOSIS — E1129 Type 2 diabetes mellitus with other diabetic kidney complication: Secondary | ICD-10-CM | POA: Diagnosis not present

## 2015-08-31 DIAGNOSIS — D631 Anemia in chronic kidney disease: Secondary | ICD-10-CM | POA: Diagnosis not present

## 2015-08-31 DIAGNOSIS — I120 Hypertensive chronic kidney disease with stage 5 chronic kidney disease or end stage renal disease: Secondary | ICD-10-CM | POA: Diagnosis not present

## 2015-08-31 DIAGNOSIS — N186 End stage renal disease: Secondary | ICD-10-CM | POA: Diagnosis not present

## 2015-08-31 DIAGNOSIS — Z992 Dependence on renal dialysis: Secondary | ICD-10-CM | POA: Diagnosis not present

## 2015-09-02 DIAGNOSIS — Z23 Encounter for immunization: Secondary | ICD-10-CM | POA: Diagnosis not present

## 2015-09-02 DIAGNOSIS — D509 Iron deficiency anemia, unspecified: Secondary | ICD-10-CM | POA: Diagnosis not present

## 2015-09-02 DIAGNOSIS — E1129 Type 2 diabetes mellitus with other diabetic kidney complication: Secondary | ICD-10-CM | POA: Diagnosis not present

## 2015-09-02 DIAGNOSIS — D631 Anemia in chronic kidney disease: Secondary | ICD-10-CM | POA: Diagnosis not present

## 2015-09-02 DIAGNOSIS — N2581 Secondary hyperparathyroidism of renal origin: Secondary | ICD-10-CM | POA: Diagnosis not present

## 2015-09-02 DIAGNOSIS — N186 End stage renal disease: Secondary | ICD-10-CM | POA: Diagnosis not present

## 2015-09-05 DIAGNOSIS — N186 End stage renal disease: Secondary | ICD-10-CM | POA: Diagnosis not present

## 2015-09-05 DIAGNOSIS — Z23 Encounter for immunization: Secondary | ICD-10-CM | POA: Diagnosis not present

## 2015-09-05 DIAGNOSIS — D509 Iron deficiency anemia, unspecified: Secondary | ICD-10-CM | POA: Diagnosis not present

## 2015-09-05 DIAGNOSIS — D631 Anemia in chronic kidney disease: Secondary | ICD-10-CM | POA: Diagnosis not present

## 2015-09-05 DIAGNOSIS — E1129 Type 2 diabetes mellitus with other diabetic kidney complication: Secondary | ICD-10-CM | POA: Diagnosis not present

## 2015-09-05 DIAGNOSIS — N2581 Secondary hyperparathyroidism of renal origin: Secondary | ICD-10-CM | POA: Diagnosis not present

## 2015-09-07 DIAGNOSIS — Z992 Dependence on renal dialysis: Secondary | ICD-10-CM | POA: Diagnosis not present

## 2015-09-07 DIAGNOSIS — Z452 Encounter for adjustment and management of vascular access device: Secondary | ICD-10-CM | POA: Diagnosis not present

## 2015-09-07 DIAGNOSIS — E1129 Type 2 diabetes mellitus with other diabetic kidney complication: Secondary | ICD-10-CM | POA: Diagnosis not present

## 2015-09-07 DIAGNOSIS — N2581 Secondary hyperparathyroidism of renal origin: Secondary | ICD-10-CM | POA: Diagnosis not present

## 2015-09-07 DIAGNOSIS — Z23 Encounter for immunization: Secondary | ICD-10-CM | POA: Diagnosis not present

## 2015-09-07 DIAGNOSIS — I871 Compression of vein: Secondary | ICD-10-CM | POA: Diagnosis not present

## 2015-09-07 DIAGNOSIS — D631 Anemia in chronic kidney disease: Secondary | ICD-10-CM | POA: Diagnosis not present

## 2015-09-07 DIAGNOSIS — T82858D Stenosis of vascular prosthetic devices, implants and grafts, subsequent encounter: Secondary | ICD-10-CM | POA: Diagnosis not present

## 2015-09-07 DIAGNOSIS — D509 Iron deficiency anemia, unspecified: Secondary | ICD-10-CM | POA: Diagnosis not present

## 2015-09-07 DIAGNOSIS — N186 End stage renal disease: Secondary | ICD-10-CM | POA: Diagnosis not present

## 2015-09-08 ENCOUNTER — Encounter (HOSPITAL_COMMUNITY): Payer: Self-pay | Admitting: *Deleted

## 2015-09-08 ENCOUNTER — Emergency Department (HOSPITAL_COMMUNITY)
Admission: EM | Admit: 2015-09-08 | Discharge: 2015-09-08 | Disposition: A | Discharge status: Home / Self Care | Payer: Medicare Other | Attending: Emergency Medicine | Admitting: Emergency Medicine

## 2015-09-08 DIAGNOSIS — M1990 Unspecified osteoarthritis, unspecified site: Secondary | ICD-10-CM | POA: Diagnosis not present

## 2015-09-08 DIAGNOSIS — K219 Gastro-esophageal reflux disease without esophagitis: Secondary | ICD-10-CM | POA: Diagnosis not present

## 2015-09-08 DIAGNOSIS — T82838A Hemorrhage of vascular prosthetic devices, implants and grafts, initial encounter: Secondary | ICD-10-CM | POA: Insufficient documentation

## 2015-09-08 DIAGNOSIS — J45909 Unspecified asthma, uncomplicated: Secondary | ICD-10-CM | POA: Diagnosis not present

## 2015-09-08 DIAGNOSIS — R011 Cardiac murmur, unspecified: Secondary | ICD-10-CM | POA: Diagnosis not present

## 2015-09-08 DIAGNOSIS — Z8781 Personal history of (healed) traumatic fracture: Secondary | ICD-10-CM | POA: Diagnosis not present

## 2015-09-08 DIAGNOSIS — Z992 Dependence on renal dialysis: Secondary | ICD-10-CM | POA: Diagnosis not present

## 2015-09-08 DIAGNOSIS — N186 End stage renal disease: Secondary | ICD-10-CM | POA: Diagnosis not present

## 2015-09-08 DIAGNOSIS — Z79899 Other long term (current) drug therapy: Secondary | ICD-10-CM | POA: Diagnosis not present

## 2015-09-08 DIAGNOSIS — I209 Angina pectoris, unspecified: Secondary | ICD-10-CM | POA: Diagnosis not present

## 2015-09-08 DIAGNOSIS — E119 Type 2 diabetes mellitus without complications: Secondary | ICD-10-CM | POA: Diagnosis not present

## 2015-09-08 DIAGNOSIS — Z792 Long term (current) use of antibiotics: Secondary | ICD-10-CM | POA: Insufficient documentation

## 2015-09-08 DIAGNOSIS — I120 Hypertensive chronic kidney disease with stage 5 chronic kidney disease or end stage renal disease: Secondary | ICD-10-CM | POA: Insufficient documentation

## 2015-09-08 DIAGNOSIS — R58 Hemorrhage, not elsewhere classified: Secondary | ICD-10-CM | POA: Diagnosis not present

## 2015-09-08 DIAGNOSIS — Y832 Surgical operation with anastomosis, bypass or graft as the cause of abnormal reaction of the patient, or of later complication, without mention of misadventure at the time of the procedure: Secondary | ICD-10-CM | POA: Insufficient documentation

## 2015-09-08 DIAGNOSIS — G8929 Other chronic pain: Secondary | ICD-10-CM | POA: Diagnosis not present

## 2015-09-08 DIAGNOSIS — E785 Hyperlipidemia, unspecified: Secondary | ICD-10-CM | POA: Diagnosis not present

## 2015-09-08 DIAGNOSIS — Z862 Personal history of diseases of the blood and blood-forming organs and certain disorders involving the immune mechanism: Secondary | ICD-10-CM | POA: Insufficient documentation

## 2015-09-08 DIAGNOSIS — Z87828 Personal history of other (healed) physical injury and trauma: Secondary | ICD-10-CM | POA: Insufficient documentation

## 2015-09-08 DIAGNOSIS — Z87891 Personal history of nicotine dependence: Secondary | ICD-10-CM | POA: Insufficient documentation

## 2015-09-08 DIAGNOSIS — Z8701 Personal history of pneumonia (recurrent): Secondary | ICD-10-CM | POA: Insufficient documentation

## 2015-09-08 DIAGNOSIS — T8383XA Hemorrhage of genitourinary prosthetic devices, implants and grafts, initial encounter: Secondary | ICD-10-CM | POA: Diagnosis not present

## 2015-09-08 MED ORDER — SODIUM CHLORIDE 0.9 % IV SOLN
20.0000 ug | Freq: Once | INTRAVENOUS | Status: DC
  Filled 2015-09-08: qty 5

## 2015-09-08 NOTE — ED Notes (Signed)
Pt received dialysis today and her right femoral dialysis accessed was removed and they placed a rt subclavian shunt. Pt reports bleeding to both sites.

## 2015-09-08 NOTE — ED Provider Notes (Signed)
CSN: [id number]     Arrival date & time 09/08/15  0247 History   This chart was scribed for Everlene Balls, MD by Evelene Croon, ED Scribe. This patient was seen in room B16C/B16C and the patient's care was started 2:56 AM.    Chief Complaint  Patient presents with  . Vascular Access Problem    The history is provided by the patient and the EMS personnel. No language interpreter was used.   HPI Comments:  [name] is a 81 y.o. female with a history of ESRD who presents to the Emergency Department via EMS, complaining of bleeding from her vascular accesses in her right thigh and right chest. Pt notes the bleeding from the AV fistula in her right thigh started a few hours after dialysis yesterday (09/07/15) and the subclavian shunt placed in her right chest today also began bleeding after returning home. No alleviating factors noted pt unable to control bleeding at home. [facility]  Past Medical History  Diagnosis Date  . Hypertension   . Gout, unspecified 1980's    "not anymore" (03/09/2014)  . Mild mitral valve stenosis   . Gangrene     left fifth toe  . Peripheral vascular disease   . Hyperlipidemia   . Aortic valve sclerosis   . Shoulder fracture, right 2012  . Blood transfusion 1960's  . Chronic back pain   . Other specified cardiac dysrhythmias(427.89)     sees Dr. Angelena Form  . Heart murmur     "I think so" (11/25/2012)  . Anginal pain   . Anemia   . Exertional shortness of breath   . Asthma     "used to; not anymore" (03/09/2014)  . Pneumonia     "once; years ago" (03/09/2014)  . Type II diabetes mellitus     "not on any medication at this time" (03/09/2014)  . GERD (gastroesophageal reflux disease)     hx (03/09/2014)  . Arthritis     "all over" (03/09/2014)  . ESRD (end stage renal disease) on dialysis     M, W, Fr. [place] dialysis (03/09/2014)   Past Surgical History  Procedure Laterality Date  . Av fistula repair Bilateral     "right/left arm fistula  failed; removed left thigh d/t poor circulation" (11/25/2012)  . Arteriovenous graft placement Left     femoral loop arteriovenous Gore-Tex graft.  . Av fistula placement  2008- 2013    "left upper arm; twice in my neck; left leg; removed from left leg; right upper arm" (11/25/2012)  . Foot amputation through metatarsal Left 06/22/11    "whole 5th toe" (11/25/2012)  . Pr vein bypass graft,aorto-fem-pop  06/14/2011  . Femoral-popliteal bypass graft  04/11/2012    Procedure: BYPASS GRAFT FEMORAL-POPLITEAL ARTERY;  Surgeon: [name], MD;  Location: [facility];  Service: Vascular;  Laterality: Left;  Thrombectomy/Left femoral-popliteal bypass with revision of proximal end and shortening of graft; intraoperative arteriogram; endarterectomy and patch angioplasty with distal anastomosis  . Appendectomy  1960's  . Femoral-popliteal bypass graft  10/09/2012    Procedure: BYPASS GRAFT FEMORAL-POPLITEAL ARTERY;  Surgeon: [name], MD;  Location: [facility];  Service: Vascular;  Laterality: Left;  Redo left tibioperoneal trunk bypass with Gortex Graft 84mmx80cm.  . Total abdominal hysterectomy  1960's    one ovary removed  . Kyphoplasty  11/25/2012    Procedure: KYPHOPLASTY;  Surgeon: Kristeen Miss, MD;  Location: [facility];  Service: Neurosurgery;  Laterality: N/A;  Lumbar two  lumbar five Kyphoplasty  . Colonoscopy  2014?  Marland Kitchen Cataract extraction, bilateral Bilateral   . Exchange of a dialysis catheter Right 06/11/2013    Procedure: EXCHANGE OF A DIALYSIS CATHETER;  Surgeon: Conrad [place], MD;  Location: [facility];  Service: Vascular;  Laterality: Right;  . Fistulogram Right 06/11/2013    Procedure: Venogram with angioplasty;  Surgeon: Conrad Elsah, MD;  Location: [facility];  Service: Vascular;  Laterality: Right;  RIGHT CENTRAL VENOGRAM WITH ANGIOPLASTY  . Esophagogastroduodenoscopy N/A 09/10/2013    Procedure: ESOPHAGOGASTRODUODENOSCOPY (EGD);  Surgeon: [name], MD;  Location: [name];  Service:  Endoscopy;  Laterality: N/A;  . Eye surgery    . Av fistula placement Right 03/09/2014    Procedure: RIGHT AXILLARY EXPLORATION; PARTIAL REMOVOAL OF OLD ARTERIOVENOUS (AV) GORE-TEX GRAFT; LIGATION OF RIGHT AXILLARY VEIN; ULTRASOUND GUIDED;  Surgeon: Conrad [place], MD;  Location: [place] [facility] [place];  Service: Vascular;  Laterality: Right;  . Insertion of dialysis catheter Right 03/10/2014    Procedure: INSERTION OF TUNNELED  DIALYSIS CATHETER -attempted  RIGHT SUBCLAVIAN, RIGHT FEMORAL TUNNELED DIALYSIS CATHETER EXCHANGE with Inferior Vena Cava gram and Fibrin Sheath Angioplasty;  Surgeon: Conrad Edgemoor, MD;  [facility];  Service: Vascular;  Laterality: Right;   Family History  Problem Relation Age of Onset  . Peripheral vascular disease Mother     amputation  . Hypertension Mother   . COPD Sister   . Heart attack Sister    Social History  Substance Use Topics  . Smoking status: Former Smoker -- 0.12 packs/day for 15 years    Types: Cigarettes  . Smokeless tobacco: Never Used     Comment: 03/09/2014 "quit smoking cigarettes in the 1990's"  . Alcohol Use: No     Comment: hx of abuse stopped 1990's   OB History    No data available     Review of Systems  A complete 10 system review of systems was obtained and all systems are negative except as noted in the HPI and PMH.    Allergies  Ace inhibitors  Home Medications   Prior to Admission medications   Medication Sig Start Date End Date Taking? Authorizing Provider  amoxicillin-clavulanate (AUGMENTIN) 875-125 MG per tablet Take 1 tablet by mouth 2 (two) times daily. One po bid x 7 days 05/10/15   Charlesetta Shanks, MD  calcium acetate (PHOSLO) 667 MG capsule Take 667 mg by mouth 3 (three) times daily with meals. 05/03/15   Historical Provider, MD  cloNIDine (CATAPRES) 0.2 MG tablet Take 0.2 mg by mouth 2 (two) times daily.  03/09/13   Historical Provider, MD  loratadine (CLARITIN) 10 MG tablet Take 1 tablet (10 mg total) by mouth daily. One po daily  x 5 days 05/10/15   Charlesetta Shanks, MD  metoprolol succinate (TOPROL-XL) 50 MG 24 hr tablet Take 50 mg by mouth daily. Take with or immediately following a meal. 04/15/12   Samantha J Rhyne, PA-C  ondansetron (ZOFRAN) 4 MG tablet Take 1 tablet (4 mg total) by mouth every 8 (eight) hours as needed for nausea or vomiting. Patient not taking: Reported on 05/10/2015 03/11/14   Ulyses Amor, PA-C  oxyCODONE-acetaminophen (PERCOCET) 5-325 MG per tablet Take 1-2 tablets by mouth every 4 (four) hours as needed. 05/10/15   Charlesetta Shanks, MD  pantoprazole (PROTONIX) 40 MG tablet Take 40 mg by mouth 2 (two) times daily.    Historical Provider, MD   There were no vitals taken for this  visit. Physical Exam  Constitutional: She is oriented to person, place, and time. She appears well-developed and well-nourished. No distress.  HENT:  Head: Normocephalic and atraumatic.  Nose: Nose normal.  Mouth/Throat: Oropharynx is clear and moist. No oropharyngeal exudate.  Eyes: Conjunctivae and EOM are normal. Pupils are equal, round, and reactive to light. No scleral icterus.  Neck: Normal range of motion. Neck supple. No JVD present. No tracheal deviation present. No thyromegaly present.  Cardiovascular: Normal rate, regular rhythm and normal heart sounds.  Exam reveals no gallop and no friction rub.   No murmur heard. Pulmonary/Chest: Effort normal and breath sounds normal. No respiratory distress. She has no wheezes. She exhibits no tenderness.  Abdominal: Soft. Bowel sounds are normal. She exhibits no distension and no mass. There is no tenderness. There is no rebound and no guarding.  Musculoskeletal: Normal range of motion. She exhibits no edema or tenderness.  Lymphadenopathy:    She has no cervical adenopathy.  Neurological: She is alert and oriented to person, place, and time. No cranial nerve deficit. She exhibits normal muscle tone.  Skin: Skin is warm and dry. No pallor.  HD cath extending to right neck,  dried blood clots present Cath site clean, dry, intact; no sign of infection RLE with AV fistula, dried blood seen site, clean, dry, and intact; no sign of infection; 4x 4 gauze to site sutured down not removed   Nursing note and vitals reviewed.   ED Course  Procedures   DIAGNOSTIC STUDIES:  Oxygen Saturation is 100% on RA, normal by my interpretation.    COORDINATION OF CARE:  3:02 AM Discussed treatment plan with pt at bedside and pt agreed to plan.  Labs Review   Labs Reviewed  CBC WITH DIFFERENTIAL/PLATELET    Imaging Review No results found. I have personally reviewed and evaluated these images and lab results as part of my medical decision-making.   EKG Interpretation None      MDM   Final diagnoses:  None   Patient presents emergency department for bleeding at dialysis site. Upon my evaluation the patient, I remove the dressings and bleeding had stopped. Blood clots were removed. All sutured in dressings were kept intact and the dressings were changed. She is advised to follow-up with her doctor within 3 days for close management. She is observed in the emergency department and there is no recurrence of her bleeding. She currently appears well in no acute distress. Her vital signs remain within her normal limits and she is safe for discharge.  I personally performed the services described in this documentation, which was scribed in my presence. The recorded information has been reviewed and is accurate.   Everlene Balls, MD 09/08/15 [id number]

## 2015-09-08 NOTE — ED Notes (Signed)
Pt stable, ambulatory, states understanding of discharge instructions 

## 2015-09-08 NOTE — Discharge Instructions (Signed)
Central Line Dialysis Access Placement, Care After Ms. Costilow, you're bleeding was stopped. Your dressings were changed. See your primary doctor within 3 days for close follow-up. If any symptoms worsen come back to emergency department immediately. Thank you. Refer to this sheet after your procedure. These instructions provide you with information on caring for yourself after your procedure. Your health care provider may also give you more specific instructions. Your treatment has been planned according to current medical practices, but problems sometimes occur. Call your health care provider if you have any problems or questions after your procedure.  WHAT TO EXPECT AFTER THE PROCEDURE  You may feel some discomfort after the local anesthetic wears off. Your discomfort should gradually improve over the next several days. Ask your health care provider if you can take pain medicines.  You may notice some redness, mild pain, swelling, bruising, or light bleeding at the site where the thin, flexible tube (catheter) was placed. These should improve over the next several days. HOME CARE INSTRUCTIONS   Rest for the remainder of the day.  Avoid any heavy lifting (more than 10 lb [4.5 kg]) for at least 3 days.   If your catheter bandage (dressing) becomes soaked with blood, apply firm and direct pressure on the insertion site and sit up for 20 minutes. Your dialysis nurses will change your dressing during your next visit or at your next dialysis treatment.  Keep the dressing around the insertion site dry. Avoid using the shower. You may take a bath after 24 hours. Bathe in a tub and keep the dressing and catheter above the level of the water. Do not submerge catheter underwater, such as by swimming.  You may resume your usual diet.  Do not operate heavy machinery, drive, or make legal decisions for the first 24 hours after the procedure if you were given sedatives or other medicines to help you relax.   SEEK MEDICAL CARE IF:  The catheter gets pulled or comes out.  You develop any signs of infection around the insertion site such as:   Bleeding that does not stop even after applying pressure as instructed.  Increased pain.  Unusual drainage such as pus.   You develop a fever or chills.   You have any other questions or concerns related to your procedure or the care of your central line.  SEEK IMMEDIATE MEDICAL CARE IF:  You develop lightheadedness or dizziness.   You faint.   You develop shortness of breath or difficulty breathing.   You have any symptoms of an allergic reaction, such as:  Itching.   Rash at the site of insertion. Document Released: 07/31/2004 Document Revised: 12/22/2013 Document Reviewed: 06/03/2013 [facility] Patient Information 2015 [place], [place]. This information is not intended to replace advice given to you by your health care provider. Make sure you discuss any questions you have with your health care provider.

## 2015-09-09 DIAGNOSIS — N186 End stage renal disease: Secondary | ICD-10-CM | POA: Diagnosis not present

## 2015-09-09 DIAGNOSIS — D509 Iron deficiency anemia, unspecified: Secondary | ICD-10-CM | POA: Diagnosis not present

## 2015-09-09 DIAGNOSIS — D631 Anemia in chronic kidney disease: Secondary | ICD-10-CM | POA: Diagnosis not present

## 2015-09-09 DIAGNOSIS — Z23 Encounter for immunization: Secondary | ICD-10-CM | POA: Diagnosis not present

## 2015-09-09 DIAGNOSIS — N2581 Secondary hyperparathyroidism of renal origin: Secondary | ICD-10-CM | POA: Diagnosis not present

## 2015-09-09 DIAGNOSIS — E1129 Type 2 diabetes mellitus with other diabetic kidney complication: Secondary | ICD-10-CM | POA: Diagnosis not present

## 2015-09-12 DIAGNOSIS — N2581 Secondary hyperparathyroidism of renal origin: Secondary | ICD-10-CM | POA: Diagnosis not present

## 2015-09-12 DIAGNOSIS — D631 Anemia in chronic kidney disease: Secondary | ICD-10-CM | POA: Diagnosis not present

## 2015-09-12 DIAGNOSIS — Z23 Encounter for immunization: Secondary | ICD-10-CM | POA: Diagnosis not present

## 2015-09-12 DIAGNOSIS — D509 Iron deficiency anemia, unspecified: Secondary | ICD-10-CM | POA: Diagnosis not present

## 2015-09-12 DIAGNOSIS — N186 End stage renal disease: Secondary | ICD-10-CM | POA: Diagnosis not present

## 2015-09-12 DIAGNOSIS — E1129 Type 2 diabetes mellitus with other diabetic kidney complication: Secondary | ICD-10-CM | POA: Diagnosis not present

## 2015-09-14 DIAGNOSIS — D509 Iron deficiency anemia, unspecified: Secondary | ICD-10-CM | POA: Diagnosis not present

## 2015-09-14 DIAGNOSIS — N2581 Secondary hyperparathyroidism of renal origin: Secondary | ICD-10-CM | POA: Diagnosis not present

## 2015-09-14 DIAGNOSIS — D631 Anemia in chronic kidney disease: Secondary | ICD-10-CM | POA: Diagnosis not present

## 2015-09-14 DIAGNOSIS — N186 End stage renal disease: Secondary | ICD-10-CM | POA: Diagnosis not present

## 2015-09-14 DIAGNOSIS — Z23 Encounter for immunization: Secondary | ICD-10-CM | POA: Diagnosis not present

## 2015-09-14 DIAGNOSIS — E1129 Type 2 diabetes mellitus with other diabetic kidney complication: Secondary | ICD-10-CM | POA: Diagnosis not present

## 2015-09-16 DIAGNOSIS — D631 Anemia in chronic kidney disease: Secondary | ICD-10-CM | POA: Diagnosis not present

## 2015-09-16 DIAGNOSIS — Z23 Encounter for immunization: Secondary | ICD-10-CM | POA: Diagnosis not present

## 2015-09-16 DIAGNOSIS — N186 End stage renal disease: Secondary | ICD-10-CM | POA: Diagnosis not present

## 2015-09-16 DIAGNOSIS — N2581 Secondary hyperparathyroidism of renal origin: Secondary | ICD-10-CM | POA: Diagnosis not present

## 2015-09-16 DIAGNOSIS — D509 Iron deficiency anemia, unspecified: Secondary | ICD-10-CM | POA: Diagnosis not present

## 2015-09-16 DIAGNOSIS — E1129 Type 2 diabetes mellitus with other diabetic kidney complication: Secondary | ICD-10-CM | POA: Diagnosis not present

## 2015-09-19 DIAGNOSIS — E1129 Type 2 diabetes mellitus with other diabetic kidney complication: Secondary | ICD-10-CM | POA: Diagnosis not present

## 2015-09-19 DIAGNOSIS — Z23 Encounter for immunization: Secondary | ICD-10-CM | POA: Diagnosis not present

## 2015-09-19 DIAGNOSIS — D631 Anemia in chronic kidney disease: Secondary | ICD-10-CM | POA: Diagnosis not present

## 2015-09-19 DIAGNOSIS — N186 End stage renal disease: Secondary | ICD-10-CM | POA: Diagnosis not present

## 2015-09-19 DIAGNOSIS — D509 Iron deficiency anemia, unspecified: Secondary | ICD-10-CM | POA: Diagnosis not present

## 2015-09-19 DIAGNOSIS — N2581 Secondary hyperparathyroidism of renal origin: Secondary | ICD-10-CM | POA: Diagnosis not present

## 2015-09-21 DIAGNOSIS — Z23 Encounter for immunization: Secondary | ICD-10-CM | POA: Diagnosis not present

## 2015-09-21 DIAGNOSIS — D631 Anemia in chronic kidney disease: Secondary | ICD-10-CM | POA: Diagnosis not present

## 2015-09-21 DIAGNOSIS — E1129 Type 2 diabetes mellitus with other diabetic kidney complication: Secondary | ICD-10-CM | POA: Diagnosis not present

## 2015-09-21 DIAGNOSIS — N186 End stage renal disease: Secondary | ICD-10-CM | POA: Diagnosis not present

## 2015-09-21 DIAGNOSIS — N2581 Secondary hyperparathyroidism of renal origin: Secondary | ICD-10-CM | POA: Diagnosis not present

## 2015-09-21 DIAGNOSIS — D509 Iron deficiency anemia, unspecified: Secondary | ICD-10-CM | POA: Diagnosis not present

## 2015-09-23 DIAGNOSIS — N186 End stage renal disease: Secondary | ICD-10-CM | POA: Diagnosis not present

## 2015-09-23 DIAGNOSIS — D631 Anemia in chronic kidney disease: Secondary | ICD-10-CM | POA: Diagnosis not present

## 2015-09-23 DIAGNOSIS — E1129 Type 2 diabetes mellitus with other diabetic kidney complication: Secondary | ICD-10-CM | POA: Diagnosis not present

## 2015-09-23 DIAGNOSIS — N2581 Secondary hyperparathyroidism of renal origin: Secondary | ICD-10-CM | POA: Diagnosis not present

## 2015-09-23 DIAGNOSIS — D509 Iron deficiency anemia, unspecified: Secondary | ICD-10-CM | POA: Diagnosis not present

## 2015-09-23 DIAGNOSIS — Z23 Encounter for immunization: Secondary | ICD-10-CM | POA: Diagnosis not present

## 2015-09-26 DIAGNOSIS — N2581 Secondary hyperparathyroidism of renal origin: Secondary | ICD-10-CM | POA: Diagnosis not present

## 2015-09-26 DIAGNOSIS — Z23 Encounter for immunization: Secondary | ICD-10-CM | POA: Diagnosis not present

## 2015-09-26 DIAGNOSIS — N186 End stage renal disease: Secondary | ICD-10-CM | POA: Diagnosis not present

## 2015-09-26 DIAGNOSIS — E1129 Type 2 diabetes mellitus with other diabetic kidney complication: Secondary | ICD-10-CM | POA: Diagnosis not present

## 2015-09-26 DIAGNOSIS — D631 Anemia in chronic kidney disease: Secondary | ICD-10-CM | POA: Diagnosis not present

## 2015-09-26 DIAGNOSIS — D509 Iron deficiency anemia, unspecified: Secondary | ICD-10-CM | POA: Diagnosis not present

## 2015-09-28 DIAGNOSIS — Z23 Encounter for immunization: Secondary | ICD-10-CM | POA: Diagnosis not present

## 2015-09-28 DIAGNOSIS — E1129 Type 2 diabetes mellitus with other diabetic kidney complication: Secondary | ICD-10-CM | POA: Diagnosis not present

## 2015-09-28 DIAGNOSIS — N2581 Secondary hyperparathyroidism of renal origin: Secondary | ICD-10-CM | POA: Diagnosis not present

## 2015-09-28 DIAGNOSIS — D631 Anemia in chronic kidney disease: Secondary | ICD-10-CM | POA: Diagnosis not present

## 2015-09-28 DIAGNOSIS — D509 Iron deficiency anemia, unspecified: Secondary | ICD-10-CM | POA: Diagnosis not present

## 2015-09-28 DIAGNOSIS — N186 End stage renal disease: Secondary | ICD-10-CM | POA: Diagnosis not present

## 2015-09-30 DIAGNOSIS — E1129 Type 2 diabetes mellitus with other diabetic kidney complication: Secondary | ICD-10-CM | POA: Diagnosis not present

## 2015-09-30 DIAGNOSIS — N186 End stage renal disease: Secondary | ICD-10-CM | POA: Diagnosis not present

## 2015-09-30 DIAGNOSIS — N2581 Secondary hyperparathyroidism of renal origin: Secondary | ICD-10-CM | POA: Diagnosis not present

## 2015-09-30 DIAGNOSIS — D631 Anemia in chronic kidney disease: Secondary | ICD-10-CM | POA: Diagnosis not present

## 2015-09-30 DIAGNOSIS — I120 Hypertensive chronic kidney disease with stage 5 chronic kidney disease or end stage renal disease: Secondary | ICD-10-CM | POA: Diagnosis not present

## 2015-09-30 DIAGNOSIS — D509 Iron deficiency anemia, unspecified: Secondary | ICD-10-CM | POA: Diagnosis not present

## 2015-09-30 DIAGNOSIS — Z23 Encounter for immunization: Secondary | ICD-10-CM | POA: Diagnosis not present

## 2015-09-30 DIAGNOSIS — Z992 Dependence on renal dialysis: Secondary | ICD-10-CM | POA: Diagnosis not present

## 2015-10-03 DIAGNOSIS — T82818A Embolism of vascular prosthetic devices, implants and grafts, initial encounter: Secondary | ICD-10-CM | POA: Diagnosis not present

## 2015-10-03 DIAGNOSIS — N186 End stage renal disease: Secondary | ICD-10-CM | POA: Diagnosis not present

## 2015-10-03 DIAGNOSIS — D509 Iron deficiency anemia, unspecified: Secondary | ICD-10-CM | POA: Diagnosis not present

## 2015-10-03 DIAGNOSIS — N2581 Secondary hyperparathyroidism of renal origin: Secondary | ICD-10-CM | POA: Diagnosis not present

## 2015-10-03 DIAGNOSIS — E1129 Type 2 diabetes mellitus with other diabetic kidney complication: Secondary | ICD-10-CM | POA: Diagnosis not present

## 2015-10-03 DIAGNOSIS — D631 Anemia in chronic kidney disease: Secondary | ICD-10-CM | POA: Diagnosis not present

## 2015-10-05 DIAGNOSIS — N186 End stage renal disease: Secondary | ICD-10-CM | POA: Diagnosis not present

## 2015-10-05 DIAGNOSIS — T82818A Embolism of vascular prosthetic devices, implants and grafts, initial encounter: Secondary | ICD-10-CM | POA: Diagnosis not present

## 2015-10-05 DIAGNOSIS — N2581 Secondary hyperparathyroidism of renal origin: Secondary | ICD-10-CM | POA: Diagnosis not present

## 2015-10-05 DIAGNOSIS — D631 Anemia in chronic kidney disease: Secondary | ICD-10-CM | POA: Diagnosis not present

## 2015-10-05 DIAGNOSIS — D509 Iron deficiency anemia, unspecified: Secondary | ICD-10-CM | POA: Diagnosis not present

## 2015-10-05 DIAGNOSIS — E1129 Type 2 diabetes mellitus with other diabetic kidney complication: Secondary | ICD-10-CM | POA: Diagnosis not present

## 2015-10-07 DIAGNOSIS — T82818A Embolism of vascular prosthetic devices, implants and grafts, initial encounter: Secondary | ICD-10-CM | POA: Diagnosis not present

## 2015-10-07 DIAGNOSIS — D631 Anemia in chronic kidney disease: Secondary | ICD-10-CM | POA: Diagnosis not present

## 2015-10-07 DIAGNOSIS — E1129 Type 2 diabetes mellitus with other diabetic kidney complication: Secondary | ICD-10-CM | POA: Diagnosis not present

## 2015-10-07 DIAGNOSIS — D509 Iron deficiency anemia, unspecified: Secondary | ICD-10-CM | POA: Diagnosis not present

## 2015-10-07 DIAGNOSIS — N186 End stage renal disease: Secondary | ICD-10-CM | POA: Diagnosis not present

## 2015-10-07 DIAGNOSIS — N2581 Secondary hyperparathyroidism of renal origin: Secondary | ICD-10-CM | POA: Diagnosis not present

## 2015-10-10 DIAGNOSIS — E1129 Type 2 diabetes mellitus with other diabetic kidney complication: Secondary | ICD-10-CM | POA: Diagnosis not present

## 2015-10-10 DIAGNOSIS — D509 Iron deficiency anemia, unspecified: Secondary | ICD-10-CM | POA: Diagnosis not present

## 2015-10-10 DIAGNOSIS — N186 End stage renal disease: Secondary | ICD-10-CM | POA: Diagnosis not present

## 2015-10-10 DIAGNOSIS — T82818A Embolism of vascular prosthetic devices, implants and grafts, initial encounter: Secondary | ICD-10-CM | POA: Diagnosis not present

## 2015-10-10 DIAGNOSIS — D631 Anemia in chronic kidney disease: Secondary | ICD-10-CM | POA: Diagnosis not present

## 2015-10-10 DIAGNOSIS — N2581 Secondary hyperparathyroidism of renal origin: Secondary | ICD-10-CM | POA: Diagnosis not present

## 2015-10-12 DIAGNOSIS — D509 Iron deficiency anemia, unspecified: Secondary | ICD-10-CM | POA: Diagnosis not present

## 2015-10-12 DIAGNOSIS — T82818A Embolism of vascular prosthetic devices, implants and grafts, initial encounter: Secondary | ICD-10-CM | POA: Diagnosis not present

## 2015-10-12 DIAGNOSIS — N186 End stage renal disease: Secondary | ICD-10-CM | POA: Diagnosis not present

## 2015-10-12 DIAGNOSIS — N2581 Secondary hyperparathyroidism of renal origin: Secondary | ICD-10-CM | POA: Diagnosis not present

## 2015-10-12 DIAGNOSIS — D631 Anemia in chronic kidney disease: Secondary | ICD-10-CM | POA: Diagnosis not present

## 2015-10-12 DIAGNOSIS — E1129 Type 2 diabetes mellitus with other diabetic kidney complication: Secondary | ICD-10-CM | POA: Diagnosis not present

## 2015-10-14 DIAGNOSIS — T82818A Embolism of vascular prosthetic devices, implants and grafts, initial encounter: Secondary | ICD-10-CM | POA: Diagnosis not present

## 2015-10-14 DIAGNOSIS — N186 End stage renal disease: Secondary | ICD-10-CM | POA: Diagnosis not present

## 2015-10-14 DIAGNOSIS — D509 Iron deficiency anemia, unspecified: Secondary | ICD-10-CM | POA: Diagnosis not present

## 2015-10-14 DIAGNOSIS — D631 Anemia in chronic kidney disease: Secondary | ICD-10-CM | POA: Diagnosis not present

## 2015-10-14 DIAGNOSIS — E1129 Type 2 diabetes mellitus with other diabetic kidney complication: Secondary | ICD-10-CM | POA: Diagnosis not present

## 2015-10-14 DIAGNOSIS — N2581 Secondary hyperparathyroidism of renal origin: Secondary | ICD-10-CM | POA: Diagnosis not present

## 2015-10-17 DIAGNOSIS — E1129 Type 2 diabetes mellitus with other diabetic kidney complication: Secondary | ICD-10-CM | POA: Diagnosis not present

## 2015-10-17 DIAGNOSIS — N2581 Secondary hyperparathyroidism of renal origin: Secondary | ICD-10-CM | POA: Diagnosis not present

## 2015-10-17 DIAGNOSIS — N186 End stage renal disease: Secondary | ICD-10-CM | POA: Diagnosis not present

## 2015-10-17 DIAGNOSIS — D631 Anemia in chronic kidney disease: Secondary | ICD-10-CM | POA: Diagnosis not present

## 2015-10-17 DIAGNOSIS — D509 Iron deficiency anemia, unspecified: Secondary | ICD-10-CM | POA: Diagnosis not present

## 2015-10-17 DIAGNOSIS — T82818A Embolism of vascular prosthetic devices, implants and grafts, initial encounter: Secondary | ICD-10-CM | POA: Diagnosis not present

## 2015-10-19 DIAGNOSIS — D509 Iron deficiency anemia, unspecified: Secondary | ICD-10-CM | POA: Diagnosis not present

## 2015-10-19 DIAGNOSIS — N186 End stage renal disease: Secondary | ICD-10-CM | POA: Diagnosis not present

## 2015-10-19 DIAGNOSIS — T82818A Embolism of vascular prosthetic devices, implants and grafts, initial encounter: Secondary | ICD-10-CM | POA: Diagnosis not present

## 2015-10-19 DIAGNOSIS — E1129 Type 2 diabetes mellitus with other diabetic kidney complication: Secondary | ICD-10-CM | POA: Diagnosis not present

## 2015-10-19 DIAGNOSIS — D631 Anemia in chronic kidney disease: Secondary | ICD-10-CM | POA: Diagnosis not present

## 2015-10-19 DIAGNOSIS — N2581 Secondary hyperparathyroidism of renal origin: Secondary | ICD-10-CM | POA: Diagnosis not present

## 2015-10-20 ENCOUNTER — Encounter: Payer: Medicare Other | Admitting: Cardiovascular Disease

## 2015-10-20 NOTE — Progress Notes (Signed)
Cancel.

## 2015-10-21 DIAGNOSIS — N2581 Secondary hyperparathyroidism of renal origin: Secondary | ICD-10-CM | POA: Diagnosis not present

## 2015-10-21 DIAGNOSIS — T82818A Embolism of vascular prosthetic devices, implants and grafts, initial encounter: Secondary | ICD-10-CM | POA: Diagnosis not present

## 2015-10-21 DIAGNOSIS — E1129 Type 2 diabetes mellitus with other diabetic kidney complication: Secondary | ICD-10-CM | POA: Diagnosis not present

## 2015-10-21 DIAGNOSIS — D631 Anemia in chronic kidney disease: Secondary | ICD-10-CM | POA: Diagnosis not present

## 2015-10-21 DIAGNOSIS — D509 Iron deficiency anemia, unspecified: Secondary | ICD-10-CM | POA: Diagnosis not present

## 2015-10-21 DIAGNOSIS — N186 End stage renal disease: Secondary | ICD-10-CM | POA: Diagnosis not present

## 2015-10-24 DIAGNOSIS — N186 End stage renal disease: Secondary | ICD-10-CM | POA: Diagnosis not present

## 2015-10-24 DIAGNOSIS — T82818A Embolism of vascular prosthetic devices, implants and grafts, initial encounter: Secondary | ICD-10-CM | POA: Diagnosis not present

## 2015-10-24 DIAGNOSIS — E1129 Type 2 diabetes mellitus with other diabetic kidney complication: Secondary | ICD-10-CM | POA: Diagnosis not present

## 2015-10-24 DIAGNOSIS — D509 Iron deficiency anemia, unspecified: Secondary | ICD-10-CM | POA: Diagnosis not present

## 2015-10-24 DIAGNOSIS — D631 Anemia in chronic kidney disease: Secondary | ICD-10-CM | POA: Diagnosis not present

## 2015-10-24 DIAGNOSIS — N2581 Secondary hyperparathyroidism of renal origin: Secondary | ICD-10-CM | POA: Diagnosis not present

## 2015-10-26 DIAGNOSIS — D631 Anemia in chronic kidney disease: Secondary | ICD-10-CM | POA: Diagnosis not present

## 2015-10-26 DIAGNOSIS — N2581 Secondary hyperparathyroidism of renal origin: Secondary | ICD-10-CM | POA: Diagnosis not present

## 2015-10-26 DIAGNOSIS — E1129 Type 2 diabetes mellitus with other diabetic kidney complication: Secondary | ICD-10-CM | POA: Diagnosis not present

## 2015-10-26 DIAGNOSIS — N186 End stage renal disease: Secondary | ICD-10-CM | POA: Diagnosis not present

## 2015-10-26 DIAGNOSIS — D509 Iron deficiency anemia, unspecified: Secondary | ICD-10-CM | POA: Diagnosis not present

## 2015-10-26 DIAGNOSIS — T82818A Embolism of vascular prosthetic devices, implants and grafts, initial encounter: Secondary | ICD-10-CM | POA: Diagnosis not present

## 2015-10-27 DIAGNOSIS — T82818A Embolism of vascular prosthetic devices, implants and grafts, initial encounter: Secondary | ICD-10-CM | POA: Diagnosis not present

## 2015-10-27 DIAGNOSIS — D509 Iron deficiency anemia, unspecified: Secondary | ICD-10-CM | POA: Diagnosis not present

## 2015-10-27 DIAGNOSIS — E1129 Type 2 diabetes mellitus with other diabetic kidney complication: Secondary | ICD-10-CM | POA: Diagnosis not present

## 2015-10-27 DIAGNOSIS — N2581 Secondary hyperparathyroidism of renal origin: Secondary | ICD-10-CM | POA: Diagnosis not present

## 2015-10-27 DIAGNOSIS — D631 Anemia in chronic kidney disease: Secondary | ICD-10-CM | POA: Diagnosis not present

## 2015-10-27 DIAGNOSIS — N186 End stage renal disease: Secondary | ICD-10-CM | POA: Diagnosis not present

## 2015-10-28 DIAGNOSIS — T82818A Embolism of vascular prosthetic devices, implants and grafts, initial encounter: Secondary | ICD-10-CM | POA: Diagnosis not present

## 2015-10-28 DIAGNOSIS — E1129 Type 2 diabetes mellitus with other diabetic kidney complication: Secondary | ICD-10-CM | POA: Diagnosis not present

## 2015-10-28 DIAGNOSIS — D509 Iron deficiency anemia, unspecified: Secondary | ICD-10-CM | POA: Diagnosis not present

## 2015-10-28 DIAGNOSIS — N2581 Secondary hyperparathyroidism of renal origin: Secondary | ICD-10-CM | POA: Diagnosis not present

## 2015-10-28 DIAGNOSIS — N186 End stage renal disease: Secondary | ICD-10-CM | POA: Diagnosis not present

## 2015-10-28 DIAGNOSIS — D631 Anemia in chronic kidney disease: Secondary | ICD-10-CM | POA: Diagnosis not present

## 2015-10-31 DIAGNOSIS — D631 Anemia in chronic kidney disease: Secondary | ICD-10-CM | POA: Diagnosis not present

## 2015-10-31 DIAGNOSIS — D509 Iron deficiency anemia, unspecified: Secondary | ICD-10-CM | POA: Diagnosis not present

## 2015-10-31 DIAGNOSIS — N186 End stage renal disease: Secondary | ICD-10-CM | POA: Diagnosis not present

## 2015-10-31 DIAGNOSIS — Z992 Dependence on renal dialysis: Secondary | ICD-10-CM | POA: Diagnosis not present

## 2015-10-31 DIAGNOSIS — I120 Hypertensive chronic kidney disease with stage 5 chronic kidney disease or end stage renal disease: Secondary | ICD-10-CM | POA: Diagnosis not present

## 2015-10-31 DIAGNOSIS — E1129 Type 2 diabetes mellitus with other diabetic kidney complication: Secondary | ICD-10-CM | POA: Diagnosis not present

## 2015-10-31 DIAGNOSIS — N2581 Secondary hyperparathyroidism of renal origin: Secondary | ICD-10-CM | POA: Diagnosis not present

## 2015-10-31 DIAGNOSIS — T82818A Embolism of vascular prosthetic devices, implants and grafts, initial encounter: Secondary | ICD-10-CM | POA: Diagnosis not present

## 2015-11-02 DIAGNOSIS — D509 Iron deficiency anemia, unspecified: Secondary | ICD-10-CM | POA: Diagnosis not present

## 2015-11-02 DIAGNOSIS — T82818A Embolism of vascular prosthetic devices, implants and grafts, initial encounter: Secondary | ICD-10-CM | POA: Diagnosis not present

## 2015-11-02 DIAGNOSIS — N2581 Secondary hyperparathyroidism of renal origin: Secondary | ICD-10-CM | POA: Diagnosis not present

## 2015-11-02 DIAGNOSIS — D631 Anemia in chronic kidney disease: Secondary | ICD-10-CM | POA: Diagnosis not present

## 2015-11-02 DIAGNOSIS — N186 End stage renal disease: Secondary | ICD-10-CM | POA: Diagnosis not present

## 2015-11-02 DIAGNOSIS — E1129 Type 2 diabetes mellitus with other diabetic kidney complication: Secondary | ICD-10-CM | POA: Diagnosis not present

## 2015-11-04 DIAGNOSIS — N2581 Secondary hyperparathyroidism of renal origin: Secondary | ICD-10-CM | POA: Diagnosis not present

## 2015-11-04 DIAGNOSIS — T82818A Embolism of vascular prosthetic devices, implants and grafts, initial encounter: Secondary | ICD-10-CM | POA: Diagnosis not present

## 2015-11-04 DIAGNOSIS — N186 End stage renal disease: Secondary | ICD-10-CM | POA: Diagnosis not present

## 2015-11-04 DIAGNOSIS — E1129 Type 2 diabetes mellitus with other diabetic kidney complication: Secondary | ICD-10-CM | POA: Diagnosis not present

## 2015-11-04 DIAGNOSIS — D509 Iron deficiency anemia, unspecified: Secondary | ICD-10-CM | POA: Diagnosis not present

## 2015-11-07 DIAGNOSIS — T82818A Embolism of vascular prosthetic devices, implants and grafts, initial encounter: Secondary | ICD-10-CM | POA: Diagnosis not present

## 2015-11-07 DIAGNOSIS — N2581 Secondary hyperparathyroidism of renal origin: Secondary | ICD-10-CM | POA: Diagnosis not present

## 2015-11-07 DIAGNOSIS — D509 Iron deficiency anemia, unspecified: Secondary | ICD-10-CM | POA: Diagnosis not present

## 2015-11-07 DIAGNOSIS — N186 End stage renal disease: Secondary | ICD-10-CM | POA: Diagnosis not present

## 2015-11-07 DIAGNOSIS — E1129 Type 2 diabetes mellitus with other diabetic kidney complication: Secondary | ICD-10-CM | POA: Diagnosis not present

## 2015-11-09 DIAGNOSIS — T82818A Embolism of vascular prosthetic devices, implants and grafts, initial encounter: Secondary | ICD-10-CM | POA: Diagnosis not present

## 2015-11-09 DIAGNOSIS — D509 Iron deficiency anemia, unspecified: Secondary | ICD-10-CM | POA: Diagnosis not present

## 2015-11-09 DIAGNOSIS — E1129 Type 2 diabetes mellitus with other diabetic kidney complication: Secondary | ICD-10-CM | POA: Diagnosis not present

## 2015-11-09 DIAGNOSIS — N186 End stage renal disease: Secondary | ICD-10-CM | POA: Diagnosis not present

## 2015-11-09 DIAGNOSIS — N2581 Secondary hyperparathyroidism of renal origin: Secondary | ICD-10-CM | POA: Diagnosis not present

## 2015-11-10 ENCOUNTER — Encounter: Payer: Self-pay | Admitting: Cardiology

## 2015-11-10 ENCOUNTER — Ambulatory Visit (INDEPENDENT_AMBULATORY_CARE_PROVIDER_SITE_OTHER): Payer: Medicare Other | Admitting: Cardiology

## 2015-11-10 VITALS — BP 134/80 | HR 78 | Ht <= 58 in | Wt 136.0 lb

## 2015-11-10 DIAGNOSIS — K551 Chronic vascular disorders of intestine: Secondary | ICD-10-CM

## 2015-11-10 DIAGNOSIS — R9431 Abnormal electrocardiogram [ECG] [EKG]: Secondary | ICD-10-CM | POA: Diagnosis not present

## 2015-11-10 DIAGNOSIS — I272 Other secondary pulmonary hypertension: Secondary | ICD-10-CM

## 2015-11-10 DIAGNOSIS — R0609 Other forms of dyspnea: Secondary | ICD-10-CM

## 2015-11-10 DIAGNOSIS — I10 Essential (primary) hypertension: Secondary | ICD-10-CM | POA: Diagnosis not present

## 2015-11-10 NOTE — Patient Instructions (Signed)
Medication Instructions:   Your physician recommends that you continue on your current medications as directed. Please refer to the Current Medication list given to you today.     Testing/Procedures:  Your physician has requested that you have an echocardiogram. Echocardiography is a painless test that uses sound waves to create images of your heart. It provides your doctor with information about the size and shape of your heart and how well your heart's chambers and valves are working. This procedure takes approximately one hour. There are no restrictions for this procedure.  THE PATIENT CAN HAVE HER ECHO SCHEDULED ON A Tuesday OR Thursday FOR SHE IS AT DIALYSIS ON Monday, Wednesday, Friday    Your physician has requested that you have a lexiscan myoview. For further information please visit [email or web address]. Please follow instruction sheet, as given.    THE PATIENT CAN HAVE HER LEXISCAN SCHEDULED ON A Tuesday OR Thursday FOR SHE IS AT DIALYSIS ON Monday, Wednesday, Friday      Follow-Up:  WITH DR NELSON IN 6 WEEKS OR AT HER VERY NEXT AVAILABLE AT THAT TIME      If you need a refill on your cardiac medications before your next appointment, please call your pharmacy.

## 2015-11-10 NOTE — Progress Notes (Signed)
Patient ID: [name], female   DOB: February 14, 1934, 81 y.o.   MRN: [id number]      Cardiology Office Note  Date:  11/10/2015   ID:  [name], DOB [birth year], MRN [id number]  PCP:  Margaret Brazil, MD  Cardiologist: Margaret Spark, MD   Chief complain: DOE   History of Present Illness: [name] is a 81 y.o. female who presents for evaluation of DOE. She has h/o ESRD on HD for the last 10 years. She has been noticing worsening DOE, now after walking from room to room. No chest pain, palpitations or syncope. She was seen by Dr Gwenlyn Found in the past and had a negative stress test over 10 years ago. Denies orthopnea or PND.    Past Medical History  Diagnosis Date  . Hypertension   . Gout, unspecified 1980's    "not anymore" (03/09/2014)  . Mild mitral valve stenosis   . Gangrene ([place])     left fifth toe  . Peripheral vascular disease ([place])   . Hyperlipidemia   . Aortic valve sclerosis   . Shoulder fracture, right 2012  . Blood transfusion 1960's  . Chronic back pain   . Other specified cardiac dysrhythmias(427.89)     sees Dr. Angelena Form  . Heart murmur     "I think so" (11/25/2012)  . Anginal pain ([place])   . Anemia   . Exertional shortness of breath   . Asthma     "used to; not anymore" (03/09/2014)  . Pneumonia     "once; years ago" (03/09/2014)  . Type II diabetes mellitus ([place])     "not on any medication at this time" (03/09/2014)  . GERD (gastroesophageal reflux disease)     hx (03/09/2014)  . Arthritis     "all over" (03/09/2014)  . ESRD (end stage renal disease) on dialysis ([place])     M, W, Fr. [name] dialysis (03/09/2014)    Past Surgical History  Procedure Laterality Date  . Av fistula repair Bilateral     "right/left arm fistula failed; removed left thigh d/t poor circulation" (11/25/2012)  . Arteriovenous graft placement Left     femoral loop arteriovenous Gore-Tex graft.  . Av fistula placement  2008- 2013    "left upper arm; twice  in my neck; left leg; removed from left leg; right upper arm" (11/25/2012)  . Foot amputation through metatarsal Left 06/22/11    "whole 5th toe" (11/25/2012)  . Pr vein bypass graft,aorto-fem-pop  06/14/2011  . Femoral-popliteal bypass graft  04/11/2012    Procedure: BYPASS GRAFT FEMORAL-POPLITEAL ARTERY;  Surgeon: [name], MD;  Location: [place];  Service: Vascular;  Laterality: Left;  Thrombectomy/Left femoral-popliteal bypass with revision of proximal end and shortening of graft; intraoperative arteriogram; endarterectomy and patch angioplasty with distal anastomosis  . Appendectomy  1960's  . Femoral-popliteal bypass graft  10/09/2012    Procedure: BYPASS GRAFT FEMORAL-POPLITEAL ARTERY;  Surgeon: [name], MD;  Location: [place];  Service: Vascular;  Laterality: Left;  Redo left tibioperoneal trunk bypass with Gortex Graft 78mmx80cm.  . Total abdominal hysterectomy  1960's    one ovary removed  . Kyphoplasty  11/25/2012    Procedure: KYPHOPLASTY;  Surgeon: Kristeen Miss, MD;  Location: [place] [facility];  Service: Neurosurgery;  Laterality: N/A;  Lumbar two lumbar five Kyphoplasty  . Colonoscopy  2014?  [name] Cataract extraction, bilateral Bilateral   . Exchange of a dialysis catheter Right 06/11/2013    Procedure:  EXCHANGE OF A DIALYSIS CATHETER;  Surgeon: Conrad [place], MD;  Location: [place];  Service: Vascular;  Laterality: Right;  . Fistulogram Right 06/11/2013    Procedure: Venogram with angioplasty;  Surgeon: Conrad [place], MD;  Location: [facility];  Service: Vascular;  Laterality: Right;  RIGHT CENTRAL VENOGRAM WITH ANGIOPLASTY  . Esophagogastroduodenoscopy N/A 09/10/2013    Procedure: ESOPHAGOGASTRODUODENOSCOPY (EGD);  Surgeon: [name], MD;  Location: [name];  Service: Endoscopy;  Laterality: N/A;  . Eye surgery    . Av fistula placement Right 03/09/2014    Procedure: RIGHT AXILLARY EXPLORATION; PARTIAL REMOVOAL OF OLD ARTERIOVENOUS (AV) GORE-TEX GRAFT; LIGATION OF RIGHT  AXILLARY VEIN; ULTRASOUND GUIDED;  Surgeon: Conrad [place], MD;  Location: [place];  Service: Vascular;  Laterality: Right;  . Insertion of dialysis catheter Right 03/10/2014    Procedure: INSERTION OF TUNNELED  DIALYSIS CATHETER -attempted  RIGHT SUBCLAVIAN, RIGHT FEMORAL TUNNELED DIALYSIS CATHETER EXCHANGE with Inferior Vena Cava gram and Fibrin Sheath Angioplasty;  Surgeon: Conrad [place], MD;  Location: [place];  Service: Vascular;  Laterality: Right;     Current Outpatient Prescriptions  Medication Sig Dispense Refill  . cloNIDine (CATAPRES) 0.2 MG tablet Take 0.2 mg by mouth 2 (two) times daily.     . metoprolol succinate (TOPROL-XL) 50 MG 24 hr tablet Take 50 mg by mouth daily. Take with or immediately following a meal.    . pantoprazole (PROTONIX) 40 MG tablet Take 40 mg by mouth 2 (two) times daily.     No current facility-administered medications for this visit.    Allergies:   Ace inhibitors    Social History:  The patient  reports that she has quit smoking. Her smoking use included Cigarettes. She has a 1.8 pack-year smoking history. She has never used smokeless tobacco. She reports that she does not drink alcohol or use illicit drugs.   Family History:  The patient's family history includes COPD in her sister; Heart attack in her sister; Hypertension in her mother; Peripheral vascular disease in her mother.    ROS:  Please see the history of present illness.   Otherwise, review of systems are positive for none.   All other systems are reviewed and negative.    PHYSICAL EXAM: VS:  BP 134/80 mmHg  Pulse 78  Ht 4\' 9"  (1.448 m)  Wt 136 lb (61.689 kg)  BMI 29.42 kg/m2 , BMI Body mass index is 29.42 kg/(m^2). GEN: Well nourished, well developed, in no acute distress HEENT: normal Neck: no JVD, carotid bruits, or masses Cardiac: RRR; 2/6 systolic murmur murmurs, rubs, or gallops,no edema  Respiratory:  clear to auscultation bilaterally, normal work of breathing GI: soft, nontender,  nondistended, + BS MS: no deformity or atrophy Skin: warm and dry, no rash Neuro:  Strength and sensation are intact Psych: euthymic mood, full affect  EKG:  EKG is ordered today. The ekg ordered today demonstrates SR, negative T waves in the inferolateral leads.   Recent Labs: 12/17/2014: ALT 9 05/10/2015: BUN 52*; Creatinine, Ser 8.97*; Hemoglobin 10.9*; Platelets 135*; Potassium 5.4*; Sodium 137   Lipid Panel No results found for: CHOL, TRIG, HDL, CHOLHDL, VLDL, LDLCALC, LDLDIRECT   Wt Readings from Last 3 Encounters:  11/10/15 136 lb (61.689 kg)  10/14/14 142 lb 4.8 oz (64.547 kg)  04/16/14 135 lb (61.236 kg)    TTE: 2013 Left ventricle: The cavity size was normal. Wall thickness was normal. Systolic function was normal. The estimated ejection fraction was in  the range of 50% to 55%. - Tricuspid valve: Severe regurgitation. - Pulmonary arteries: Systolic pressure was mildly increased. PA peak pressure: 43mm Hg (S).   ASSESSMENT AND PLAN:  1.  DOE - the patient has multiple risk factors including ESRD on HD, DM, HTN, age and has abnormal ECG with negative T waves in the inferolateral leads, the ones in inferior leads are new. Order Lexiscan nuclear stress test.  2. Hypertension - controlled  3. Pulmonary hypertension - mild on prior TTE in 2013, we will repeat TTE, if persistent I would switch clonidine to amlodipine.   4. Weak LE pulses - we will order LE Duplex at the next visit, denies claudications.   Follow up in 2 months.   Signed, Tykiera Spark, MD  11/10/2015 2:36 PM    [facility] [address], [place], [place]  [address] Phone: [phone]; Fax: [phone]

## 2015-11-11 DIAGNOSIS — N186 End stage renal disease: Secondary | ICD-10-CM | POA: Diagnosis not present

## 2015-11-11 DIAGNOSIS — T82818A Embolism of vascular prosthetic devices, implants and grafts, initial encounter: Secondary | ICD-10-CM | POA: Diagnosis not present

## 2015-11-11 DIAGNOSIS — D509 Iron deficiency anemia, unspecified: Secondary | ICD-10-CM | POA: Diagnosis not present

## 2015-11-11 DIAGNOSIS — N2581 Secondary hyperparathyroidism of renal origin: Secondary | ICD-10-CM | POA: Diagnosis not present

## 2015-11-11 DIAGNOSIS — E1129 Type 2 diabetes mellitus with other diabetic kidney complication: Secondary | ICD-10-CM | POA: Diagnosis not present

## 2015-11-14 DIAGNOSIS — D509 Iron deficiency anemia, unspecified: Secondary | ICD-10-CM | POA: Diagnosis not present

## 2015-11-14 DIAGNOSIS — N186 End stage renal disease: Secondary | ICD-10-CM | POA: Diagnosis not present

## 2015-11-14 DIAGNOSIS — N2581 Secondary hyperparathyroidism of renal origin: Secondary | ICD-10-CM | POA: Diagnosis not present

## 2015-11-14 DIAGNOSIS — E1129 Type 2 diabetes mellitus with other diabetic kidney complication: Secondary | ICD-10-CM | POA: Diagnosis not present

## 2015-11-14 DIAGNOSIS — T82818A Embolism of vascular prosthetic devices, implants and grafts, initial encounter: Secondary | ICD-10-CM | POA: Diagnosis not present

## 2015-11-16 DIAGNOSIS — N186 End stage renal disease: Secondary | ICD-10-CM | POA: Diagnosis not present

## 2015-11-16 DIAGNOSIS — D509 Iron deficiency anemia, unspecified: Secondary | ICD-10-CM | POA: Diagnosis not present

## 2015-11-16 DIAGNOSIS — N2581 Secondary hyperparathyroidism of renal origin: Secondary | ICD-10-CM | POA: Diagnosis not present

## 2015-11-16 DIAGNOSIS — T82818A Embolism of vascular prosthetic devices, implants and grafts, initial encounter: Secondary | ICD-10-CM | POA: Diagnosis not present

## 2015-11-16 DIAGNOSIS — E1129 Type 2 diabetes mellitus with other diabetic kidney complication: Secondary | ICD-10-CM | POA: Diagnosis not present

## 2015-11-18 DIAGNOSIS — D509 Iron deficiency anemia, unspecified: Secondary | ICD-10-CM | POA: Diagnosis not present

## 2015-11-18 DIAGNOSIS — T82818A Embolism of vascular prosthetic devices, implants and grafts, initial encounter: Secondary | ICD-10-CM | POA: Diagnosis not present

## 2015-11-18 DIAGNOSIS — N2581 Secondary hyperparathyroidism of renal origin: Secondary | ICD-10-CM | POA: Diagnosis not present

## 2015-11-18 DIAGNOSIS — E1129 Type 2 diabetes mellitus with other diabetic kidney complication: Secondary | ICD-10-CM | POA: Diagnosis not present

## 2015-11-18 DIAGNOSIS — N186 End stage renal disease: Secondary | ICD-10-CM | POA: Diagnosis not present

## 2015-11-21 DIAGNOSIS — N2581 Secondary hyperparathyroidism of renal origin: Secondary | ICD-10-CM | POA: Diagnosis not present

## 2015-11-21 DIAGNOSIS — T82818A Embolism of vascular prosthetic devices, implants and grafts, initial encounter: Secondary | ICD-10-CM | POA: Diagnosis not present

## 2015-11-21 DIAGNOSIS — E1129 Type 2 diabetes mellitus with other diabetic kidney complication: Secondary | ICD-10-CM | POA: Diagnosis not present

## 2015-11-21 DIAGNOSIS — N186 End stage renal disease: Secondary | ICD-10-CM | POA: Diagnosis not present

## 2015-11-21 DIAGNOSIS — D509 Iron deficiency anemia, unspecified: Secondary | ICD-10-CM | POA: Diagnosis not present

## 2015-11-23 DIAGNOSIS — T82818A Embolism of vascular prosthetic devices, implants and grafts, initial encounter: Secondary | ICD-10-CM | POA: Diagnosis not present

## 2015-11-23 DIAGNOSIS — N2581 Secondary hyperparathyroidism of renal origin: Secondary | ICD-10-CM | POA: Diagnosis not present

## 2015-11-23 DIAGNOSIS — E1129 Type 2 diabetes mellitus with other diabetic kidney complication: Secondary | ICD-10-CM | POA: Diagnosis not present

## 2015-11-23 DIAGNOSIS — D509 Iron deficiency anemia, unspecified: Secondary | ICD-10-CM | POA: Diagnosis not present

## 2015-11-23 DIAGNOSIS — N186 End stage renal disease: Secondary | ICD-10-CM | POA: Diagnosis not present

## 2015-11-26 DIAGNOSIS — T82818A Embolism of vascular prosthetic devices, implants and grafts, initial encounter: Secondary | ICD-10-CM | POA: Diagnosis not present

## 2015-11-26 DIAGNOSIS — D509 Iron deficiency anemia, unspecified: Secondary | ICD-10-CM | POA: Diagnosis not present

## 2015-11-26 DIAGNOSIS — E1129 Type 2 diabetes mellitus with other diabetic kidney complication: Secondary | ICD-10-CM | POA: Diagnosis not present

## 2015-11-26 DIAGNOSIS — N186 End stage renal disease: Secondary | ICD-10-CM | POA: Diagnosis not present

## 2015-11-26 DIAGNOSIS — N2581 Secondary hyperparathyroidism of renal origin: Secondary | ICD-10-CM | POA: Diagnosis not present

## 2015-11-28 DIAGNOSIS — N186 End stage renal disease: Secondary | ICD-10-CM | POA: Diagnosis not present

## 2015-11-28 DIAGNOSIS — E1129 Type 2 diabetes mellitus with other diabetic kidney complication: Secondary | ICD-10-CM | POA: Diagnosis not present

## 2015-11-28 DIAGNOSIS — N2581 Secondary hyperparathyroidism of renal origin: Secondary | ICD-10-CM | POA: Diagnosis not present

## 2015-11-28 DIAGNOSIS — D509 Iron deficiency anemia, unspecified: Secondary | ICD-10-CM | POA: Diagnosis not present

## 2015-11-28 DIAGNOSIS — T82818A Embolism of vascular prosthetic devices, implants and grafts, initial encounter: Secondary | ICD-10-CM | POA: Diagnosis not present

## 2015-11-30 DIAGNOSIS — Z992 Dependence on renal dialysis: Secondary | ICD-10-CM | POA: Diagnosis not present

## 2015-11-30 DIAGNOSIS — N2581 Secondary hyperparathyroidism of renal origin: Secondary | ICD-10-CM | POA: Diagnosis not present

## 2015-11-30 DIAGNOSIS — N186 End stage renal disease: Secondary | ICD-10-CM | POA: Diagnosis not present

## 2015-11-30 DIAGNOSIS — I120 Hypertensive chronic kidney disease with stage 5 chronic kidney disease or end stage renal disease: Secondary | ICD-10-CM | POA: Diagnosis not present

## 2015-11-30 DIAGNOSIS — D509 Iron deficiency anemia, unspecified: Secondary | ICD-10-CM | POA: Diagnosis not present

## 2015-11-30 DIAGNOSIS — E1129 Type 2 diabetes mellitus with other diabetic kidney complication: Secondary | ICD-10-CM | POA: Diagnosis not present

## 2015-11-30 DIAGNOSIS — T82818A Embolism of vascular prosthetic devices, implants and grafts, initial encounter: Secondary | ICD-10-CM | POA: Diagnosis not present

## 2015-12-02 DIAGNOSIS — N186 End stage renal disease: Secondary | ICD-10-CM | POA: Diagnosis not present

## 2015-12-02 DIAGNOSIS — N2581 Secondary hyperparathyroidism of renal origin: Secondary | ICD-10-CM | POA: Diagnosis not present

## 2015-12-02 DIAGNOSIS — E1129 Type 2 diabetes mellitus with other diabetic kidney complication: Secondary | ICD-10-CM | POA: Diagnosis not present

## 2015-12-02 DIAGNOSIS — D631 Anemia in chronic kidney disease: Secondary | ICD-10-CM | POA: Diagnosis not present

## 2015-12-02 DIAGNOSIS — D509 Iron deficiency anemia, unspecified: Secondary | ICD-10-CM | POA: Diagnosis not present

## 2015-12-05 DIAGNOSIS — D631 Anemia in chronic kidney disease: Secondary | ICD-10-CM | POA: Diagnosis not present

## 2015-12-05 DIAGNOSIS — D509 Iron deficiency anemia, unspecified: Secondary | ICD-10-CM | POA: Diagnosis not present

## 2015-12-05 DIAGNOSIS — N186 End stage renal disease: Secondary | ICD-10-CM | POA: Diagnosis not present

## 2015-12-05 DIAGNOSIS — N2581 Secondary hyperparathyroidism of renal origin: Secondary | ICD-10-CM | POA: Diagnosis not present

## 2015-12-05 DIAGNOSIS — E1129 Type 2 diabetes mellitus with other diabetic kidney complication: Secondary | ICD-10-CM | POA: Diagnosis not present

## 2015-12-06 ENCOUNTER — Telehealth (HOSPITAL_COMMUNITY): Payer: Self-pay | Admitting: *Deleted

## 2015-12-06 NOTE — Telephone Encounter (Signed)
Patient given detailed instructions per Myocardial Perfusion Study Information Sheet for the test on 12/08/15 at 1000. Patient notified to arrive 15 minutes early and that it is imperative to arrive on time for appointment to keep from having the test rescheduled.  If you need to cancel or reschedule your appointment, please call the office within 24 hours of your appointment. Failure to do so may result in a cancellation of your appointment, and a $50 no show fee. Patient verbalized understanding.Linn Goetze, Ranae [place]ms

## 2015-12-07 ENCOUNTER — Encounter (HOSPITAL_COMMUNITY): Payer: Self-pay | Admitting: Emergency Medicine

## 2015-12-07 ENCOUNTER — Emergency Department (INDEPENDENT_AMBULATORY_CARE_PROVIDER_SITE_OTHER): Payer: Medicare Other

## 2015-12-07 ENCOUNTER — Emergency Department (INDEPENDENT_AMBULATORY_CARE_PROVIDER_SITE_OTHER)
Admission: EM | Admit: 2015-12-07 | Discharge: 2015-12-07 | Disposition: A | Discharge status: Home / Self Care | Payer: Medicare Other | Source: Home / Self Care

## 2015-12-07 DIAGNOSIS — D631 Anemia in chronic kidney disease: Secondary | ICD-10-CM | POA: Diagnosis not present

## 2015-12-07 DIAGNOSIS — N2581 Secondary hyperparathyroidism of renal origin: Secondary | ICD-10-CM | POA: Diagnosis not present

## 2015-12-07 DIAGNOSIS — S99922A Unspecified injury of left foot, initial encounter: Secondary | ICD-10-CM

## 2015-12-07 DIAGNOSIS — E1129 Type 2 diabetes mellitus with other diabetic kidney complication: Secondary | ICD-10-CM | POA: Diagnosis not present

## 2015-12-07 DIAGNOSIS — M79672 Pain in left foot: Secondary | ICD-10-CM | POA: Diagnosis not present

## 2015-12-07 DIAGNOSIS — N186 End stage renal disease: Secondary | ICD-10-CM | POA: Diagnosis not present

## 2015-12-07 DIAGNOSIS — D509 Iron deficiency anemia, unspecified: Secondary | ICD-10-CM | POA: Diagnosis not present
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
## 2015-12-07 NOTE — Discharge Instructions (Signed)

## 2015-12-07 NOTE — ED Notes (Signed)
Left foot pain, swelling.  Patient dropped glass salt shaker on 12/4.  Rates pain a 10/10

## 2015-12-08 ENCOUNTER — Other Ambulatory Visit: Payer: Self-pay

## 2015-12-08 ENCOUNTER — Ambulatory Visit (HOSPITAL_BASED_OUTPATIENT_CLINIC_OR_DEPARTMENT_OTHER): Payer: Medicare Other

## 2015-12-08 ENCOUNTER — Ambulatory Visit (HOSPITAL_COMMUNITY): Payer: Medicare Other | Attending: Cardiology

## 2015-12-08 DIAGNOSIS — R0609 Other forms of dyspnea: Secondary | ICD-10-CM

## 2015-12-08 DIAGNOSIS — E119 Type 2 diabetes mellitus without complications: Secondary | ICD-10-CM | POA: Insufficient documentation

## 2015-12-08 DIAGNOSIS — R002 Palpitations: Secondary | ICD-10-CM | POA: Diagnosis not present

## 2015-12-08 DIAGNOSIS — I10 Essential (primary) hypertension: Secondary | ICD-10-CM | POA: Diagnosis not present

## 2015-12-08 DIAGNOSIS — K551 Chronic vascular disorders of intestine: Secondary | ICD-10-CM | POA: Diagnosis not present

## 2015-12-08 DIAGNOSIS — I071 Rheumatic tricuspid insufficiency: Secondary | ICD-10-CM | POA: Diagnosis not present

## 2015-12-08 DIAGNOSIS — R079 Chest pain, unspecified: Secondary | ICD-10-CM | POA: Diagnosis not present

## 2015-12-08 DIAGNOSIS — I517 Cardiomegaly: Secondary | ICD-10-CM | POA: Insufficient documentation

## 2015-12-08 DIAGNOSIS — I129 Hypertensive chronic kidney disease with stage 1 through stage 4 chronic kidney disease, or unspecified chronic kidney disease: Secondary | ICD-10-CM | POA: Insufficient documentation

## 2015-12-08 DIAGNOSIS — I739 Peripheral vascular disease, unspecified: Secondary | ICD-10-CM | POA: Diagnosis not present

## 2015-12-08 DIAGNOSIS — I358 Other nonrheumatic aortic valve disorders: Secondary | ICD-10-CM | POA: Insufficient documentation

## 2015-12-08 DIAGNOSIS — N189 Chronic kidney disease, unspecified: Secondary | ICD-10-CM | POA: Insufficient documentation

## 2015-12-08 DIAGNOSIS — Z992 Dependence on renal dialysis: Secondary | ICD-10-CM | POA: Diagnosis not present

## 2015-12-08 DIAGNOSIS — R0989 Other specified symptoms and signs involving the circulatory and respiratory systems: Secondary | ICD-10-CM

## 2015-12-08 LAB — MYOCARDIAL PERFUSION IMAGING
LV dias vol: 45 mL
LV sys vol: 16 mL
Peak HR: 95 {beats}/min
RATE: 0.25
Rest HR: 57 {beats}/min
SDS: 2
SRS: 8
SSS: 10
TID: 1.16

## 2015-12-08 MED ORDER — TECHNETIUM TC 99M SESTAMIBI GENERIC - CARDIOLITE
10.8000 | Freq: Once | INTRAVENOUS | Status: AC | PRN
  Administered 2015-12-08: 11 via INTRAVENOUS

## 2015-12-08 MED ORDER — REGADENOSON 0.4 MG/5ML IV SOLN
0.4000 mg | Freq: Once | INTRAVENOUS | Status: AC
  Administered 2015-12-08: 0.4 mg via INTRAVENOUS

## 2015-12-08 MED ORDER — TECHNETIUM TC 99M SESTAMIBI GENERIC - CARDIOLITE
33.0000 | Freq: Once | INTRAVENOUS | Status: AC | PRN
  Administered 2015-12-08: 33 via INTRAVENOUS

## 2015-12-09 ENCOUNTER — Telehealth: Payer: Self-pay

## 2015-12-09 DIAGNOSIS — N186 End stage renal disease: Secondary | ICD-10-CM | POA: Diagnosis not present

## 2015-12-09 DIAGNOSIS — D631 Anemia in chronic kidney disease: Secondary | ICD-10-CM | POA: Diagnosis not present

## 2015-12-09 DIAGNOSIS — N2581 Secondary hyperparathyroidism of renal origin: Secondary | ICD-10-CM | POA: Diagnosis not present

## 2015-12-09 DIAGNOSIS — D509 Iron deficiency anemia, unspecified: Secondary | ICD-10-CM | POA: Diagnosis not present

## 2015-12-09 DIAGNOSIS — E1129 Type 2 diabetes mellitus with other diabetic kidney complication: Secondary | ICD-10-CM | POA: Diagnosis not present

## 2015-12-09 MED ORDER — CLONIDINE HCL 0.1 MG PO TABS: 0.1000 mg | ORAL_TABLET | Freq: Every day | ORAL | Status: DC

## 2015-12-09 MED ORDER — AMLODIPINE BESYLATE 2.5 MG PO TABS: 2.5000 mg | ORAL_TABLET | Freq: Every day | ORAL | Status: DC

## 2015-12-09 NOTE — Telephone Encounter (Signed)
Called patient about echo results. Per Dr. Meda Coffee, She has persistent pulmonary hypertension, I would decrease clonidine to 0.1 mg po daily and start amlodipine 2.5 mg po daily. Patient verbalized understanding.

## 2015-12-12 DIAGNOSIS — N186 End stage renal disease: Secondary | ICD-10-CM | POA: Diagnosis not present

## 2015-12-12 DIAGNOSIS — D509 Iron deficiency anemia, unspecified: Secondary | ICD-10-CM | POA: Diagnosis not present

## 2015-12-12 DIAGNOSIS — N2581 Secondary hyperparathyroidism of renal origin: Secondary | ICD-10-CM | POA: Diagnosis not present

## 2015-12-12 DIAGNOSIS — E1129 Type 2 diabetes mellitus with other diabetic kidney complication: Secondary | ICD-10-CM | POA: Diagnosis not present

## 2015-12-12 DIAGNOSIS — D631 Anemia in chronic kidney disease: Secondary | ICD-10-CM | POA: Diagnosis not present

## 2015-12-14 DIAGNOSIS — N2581 Secondary hyperparathyroidism of renal origin: Secondary | ICD-10-CM | POA: Diagnosis not present

## 2015-12-14 DIAGNOSIS — D631 Anemia in chronic kidney disease: Secondary | ICD-10-CM | POA: Diagnosis not present

## 2015-12-14 DIAGNOSIS — N186 End stage renal disease: Secondary | ICD-10-CM | POA: Diagnosis not present

## 2015-12-14 DIAGNOSIS — D509 Iron deficiency anemia, unspecified: Secondary | ICD-10-CM | POA: Diagnosis not present

## 2015-12-14 DIAGNOSIS — E1129 Type 2 diabetes mellitus with other diabetic kidney complication: Secondary | ICD-10-CM | POA: Diagnosis not present

## 2015-12-16 DIAGNOSIS — D631 Anemia in chronic kidney disease: Secondary | ICD-10-CM | POA: Diagnosis not present

## 2015-12-16 DIAGNOSIS — E1129 Type 2 diabetes mellitus with other diabetic kidney complication: Secondary | ICD-10-CM | POA: Diagnosis not present

## 2015-12-16 DIAGNOSIS — N2581 Secondary hyperparathyroidism of renal origin: Secondary | ICD-10-CM | POA: Diagnosis not present

## 2015-12-16 DIAGNOSIS — N186 End stage renal disease: Secondary | ICD-10-CM | POA: Diagnosis not present

## 2015-12-16 DIAGNOSIS — D509 Iron deficiency anemia, unspecified: Secondary | ICD-10-CM | POA: Diagnosis not present

## 2015-12-19 DIAGNOSIS — N186 End stage renal disease: Secondary | ICD-10-CM | POA: Diagnosis not present

## 2015-12-19 DIAGNOSIS — D631 Anemia in chronic kidney disease: Secondary | ICD-10-CM | POA: Diagnosis not present

## 2015-12-19 DIAGNOSIS — E1129 Type 2 diabetes mellitus with other diabetic kidney complication: Secondary | ICD-10-CM | POA: Diagnosis not present

## 2015-12-19 DIAGNOSIS — N2581 Secondary hyperparathyroidism of renal origin: Secondary | ICD-10-CM | POA: Diagnosis not present

## 2015-12-19 DIAGNOSIS — D509 Iron deficiency anemia, unspecified: Secondary | ICD-10-CM | POA: Diagnosis not present

## 2015-12-21 DIAGNOSIS — M71372 Other bursal cyst, left ankle and foot: Secondary | ICD-10-CM | POA: Diagnosis not present

## 2015-12-21 DIAGNOSIS — N186 End stage renal disease: Secondary | ICD-10-CM | POA: Diagnosis not present

## 2015-12-21 DIAGNOSIS — D509 Iron deficiency anemia, unspecified: Secondary | ICD-10-CM | POA: Diagnosis not present

## 2015-12-21 DIAGNOSIS — M79672 Pain in left foot: Secondary | ICD-10-CM | POA: Diagnosis not present

## 2015-12-21 DIAGNOSIS — E1129 Type 2 diabetes mellitus with other diabetic kidney complication: Secondary | ICD-10-CM | POA: Diagnosis not present

## 2015-12-21 DIAGNOSIS — D631 Anemia in chronic kidney disease: Secondary | ICD-10-CM | POA: Diagnosis not present

## 2015-12-21 DIAGNOSIS — N2581 Secondary hyperparathyroidism of renal origin: Secondary | ICD-10-CM | POA: Diagnosis not present

## 2015-12-21 DIAGNOSIS — S9032XA Contusion of left foot, initial encounter: Secondary | ICD-10-CM | POA: Diagnosis not present

## 2015-12-23 DIAGNOSIS — N186 End stage renal disease: Secondary | ICD-10-CM | POA: Diagnosis not present

## 2015-12-23 DIAGNOSIS — D631 Anemia in chronic kidney disease: Secondary | ICD-10-CM | POA: Diagnosis not present

## 2015-12-23 DIAGNOSIS — E1129 Type 2 diabetes mellitus with other diabetic kidney complication: Secondary | ICD-10-CM | POA: Diagnosis not present

## 2015-12-23 DIAGNOSIS — N2581 Secondary hyperparathyroidism of renal origin: Secondary | ICD-10-CM | POA: Diagnosis not present

## 2015-12-23 DIAGNOSIS — D509 Iron deficiency anemia, unspecified: Secondary | ICD-10-CM | POA: Diagnosis not present

## 2015-12-26 DIAGNOSIS — D631 Anemia in chronic kidney disease: Secondary | ICD-10-CM | POA: Diagnosis not present

## 2015-12-26 DIAGNOSIS — N2581 Secondary hyperparathyroidism of renal origin: Secondary | ICD-10-CM | POA: Diagnosis not present

## 2015-12-26 DIAGNOSIS — N186 End stage renal disease: Secondary | ICD-10-CM | POA: Diagnosis not present

## 2015-12-26 DIAGNOSIS — D509 Iron deficiency anemia, unspecified: Secondary | ICD-10-CM | POA: Diagnosis not present

## 2015-12-26 DIAGNOSIS — E1129 Type 2 diabetes mellitus with other diabetic kidney complication: Secondary | ICD-10-CM | POA: Diagnosis not present

## 2015-12-28 DIAGNOSIS — N186 End stage renal disease: Secondary | ICD-10-CM | POA: Diagnosis not present

## 2015-12-28 DIAGNOSIS — N2581 Secondary hyperparathyroidism of renal origin: Secondary | ICD-10-CM | POA: Diagnosis not present

## 2015-12-28 DIAGNOSIS — D631 Anemia in chronic kidney disease: Secondary | ICD-10-CM | POA: Diagnosis not present

## 2015-12-28 DIAGNOSIS — D509 Iron deficiency anemia, unspecified: Secondary | ICD-10-CM | POA: Diagnosis not present

## 2015-12-28 DIAGNOSIS — E1129 Type 2 diabetes mellitus with other diabetic kidney complication: Secondary | ICD-10-CM | POA: Diagnosis not present

## 2015-12-29 DIAGNOSIS — M71372 Other bursal cyst, left ankle and foot: Secondary | ICD-10-CM | POA: Diagnosis not present

## 2015-12-30 DIAGNOSIS — D631 Anemia in chronic kidney disease: Secondary | ICD-10-CM | POA: Diagnosis not present

## 2015-12-30 DIAGNOSIS — E1129 Type 2 diabetes mellitus with other diabetic kidney complication: Secondary | ICD-10-CM | POA: Diagnosis not present

## 2015-12-30 DIAGNOSIS — N186 End stage renal disease: Secondary | ICD-10-CM | POA: Diagnosis not present

## 2015-12-30 DIAGNOSIS — N2581 Secondary hyperparathyroidism of renal origin: Secondary | ICD-10-CM | POA: Diagnosis not present

## 2015-12-30 DIAGNOSIS — D509 Iron deficiency anemia, unspecified: Secondary | ICD-10-CM | POA: Diagnosis not present

## 2015-12-31 DIAGNOSIS — I120 Hypertensive chronic kidney disease with stage 5 chronic kidney disease or end stage renal disease: Secondary | ICD-10-CM | POA: Diagnosis not present

## 2015-12-31 DIAGNOSIS — N186 End stage renal disease: Secondary | ICD-10-CM | POA: Diagnosis not present

## 2015-12-31 DIAGNOSIS — Z992 Dependence on renal dialysis: Secondary | ICD-10-CM | POA: Diagnosis not present

## 2016-01-02 DIAGNOSIS — D631 Anemia in chronic kidney disease: Secondary | ICD-10-CM | POA: Diagnosis not present

## 2016-01-02 DIAGNOSIS — N2581 Secondary hyperparathyroidism of renal origin: Secondary | ICD-10-CM | POA: Diagnosis not present

## 2016-01-02 DIAGNOSIS — E1129 Type 2 diabetes mellitus with other diabetic kidney complication: Secondary | ICD-10-CM | POA: Diagnosis not present

## 2016-01-02 DIAGNOSIS — N186 End stage renal disease: Secondary | ICD-10-CM | POA: Diagnosis not present

## 2016-01-04 DIAGNOSIS — N186 End stage renal disease: Secondary | ICD-10-CM | POA: Diagnosis not present

## 2016-01-04 DIAGNOSIS — E1129 Type 2 diabetes mellitus with other diabetic kidney complication: Secondary | ICD-10-CM | POA: Diagnosis not present

## 2016-01-04 DIAGNOSIS — N2581 Secondary hyperparathyroidism of renal origin: Secondary | ICD-10-CM | POA: Diagnosis not present

## 2016-01-04 DIAGNOSIS — D631 Anemia in chronic kidney disease: Secondary | ICD-10-CM | POA: Diagnosis not present

## 2016-01-05 ENCOUNTER — Ambulatory Visit: Payer: Medicare Other | Admitting: Cardiology

## 2016-01-05 DIAGNOSIS — M71372 Other bursal cyst, left ankle and foot: Secondary | ICD-10-CM | POA: Diagnosis not present

## 2016-01-05 DIAGNOSIS — M7989 Other specified soft tissue disorders: Secondary | ICD-10-CM | POA: Diagnosis not present

## 2016-01-06 DIAGNOSIS — D631 Anemia in chronic kidney disease: Secondary | ICD-10-CM | POA: Diagnosis not present

## 2016-01-06 DIAGNOSIS — N2581 Secondary hyperparathyroidism of renal origin: Secondary | ICD-10-CM | POA: Diagnosis not present

## 2016-01-06 DIAGNOSIS — E1129 Type 2 diabetes mellitus with other diabetic kidney complication: Secondary | ICD-10-CM | POA: Diagnosis not present

## 2016-01-06 DIAGNOSIS — N186 End stage renal disease: Secondary | ICD-10-CM | POA: Diagnosis not present

## 2016-01-09 DIAGNOSIS — N2581 Secondary hyperparathyroidism of renal origin: Secondary | ICD-10-CM | POA: Diagnosis not present

## 2016-01-09 DIAGNOSIS — E1129 Type 2 diabetes mellitus with other diabetic kidney complication: Secondary | ICD-10-CM | POA: Diagnosis not present

## 2016-01-09 DIAGNOSIS — D631 Anemia in chronic kidney disease: Secondary | ICD-10-CM | POA: Diagnosis not present

## 2016-01-09 DIAGNOSIS — N186 End stage renal disease: Secondary | ICD-10-CM | POA: Diagnosis not present

## 2016-01-11 DIAGNOSIS — N186 End stage renal disease: Secondary | ICD-10-CM | POA: Diagnosis not present

## 2016-01-11 DIAGNOSIS — D631 Anemia in chronic kidney disease: Secondary | ICD-10-CM | POA: Diagnosis not present

## 2016-01-11 DIAGNOSIS — N2581 Secondary hyperparathyroidism of renal origin: Secondary | ICD-10-CM | POA: Diagnosis not present

## 2016-01-11 DIAGNOSIS — E1129 Type 2 diabetes mellitus with other diabetic kidney complication: Secondary | ICD-10-CM | POA: Diagnosis not present

## 2016-01-13 DIAGNOSIS — E1129 Type 2 diabetes mellitus with other diabetic kidney complication: Secondary | ICD-10-CM | POA: Diagnosis not present

## 2016-01-13 DIAGNOSIS — N186 End stage renal disease: Secondary | ICD-10-CM | POA: Diagnosis not present

## 2016-01-13 DIAGNOSIS — D631 Anemia in chronic kidney disease: Secondary | ICD-10-CM | POA: Diagnosis not present

## 2016-01-13 DIAGNOSIS — N2581 Secondary hyperparathyroidism of renal origin: Secondary | ICD-10-CM | POA: Diagnosis not present

## 2016-01-16 DIAGNOSIS — N186 End stage renal disease: Secondary | ICD-10-CM | POA: Diagnosis not present

## 2016-01-16 DIAGNOSIS — N2581 Secondary hyperparathyroidism of renal origin: Secondary | ICD-10-CM | POA: Diagnosis not present

## 2016-01-16 DIAGNOSIS — E1129 Type 2 diabetes mellitus with other diabetic kidney complication: Secondary | ICD-10-CM | POA: Diagnosis not present

## 2016-01-16 DIAGNOSIS — D631 Anemia in chronic kidney disease: Secondary | ICD-10-CM | POA: Diagnosis not present

## 2016-01-17 DIAGNOSIS — N185 Chronic kidney disease, stage 5: Secondary | ICD-10-CM | POA: Diagnosis not present

## 2016-01-17 DIAGNOSIS — E1121 Type 2 diabetes mellitus with diabetic nephropathy: Secondary | ICD-10-CM | POA: Diagnosis not present

## 2016-01-17 DIAGNOSIS — I119 Hypertensive heart disease without heart failure: Secondary | ICD-10-CM | POA: Diagnosis not present

## 2016-01-17 DIAGNOSIS — Z79899 Other long term (current) drug therapy: Secondary | ICD-10-CM | POA: Diagnosis not present

## 2016-01-17 DIAGNOSIS — E119 Type 2 diabetes mellitus without complications: Secondary | ICD-10-CM | POA: Diagnosis not present

## 2016-01-17 DIAGNOSIS — E663 Overweight: Secondary | ICD-10-CM | POA: Diagnosis not present

## 2016-01-17 DIAGNOSIS — K219 Gastro-esophageal reflux disease without esophagitis: Secondary | ICD-10-CM | POA: Diagnosis not present

## 2016-01-17 DIAGNOSIS — I739 Peripheral vascular disease, unspecified: Secondary | ICD-10-CM | POA: Diagnosis not present

## 2016-01-17 DIAGNOSIS — E785 Hyperlipidemia, unspecified: Secondary | ICD-10-CM | POA: Diagnosis not present

## 2016-01-18 DIAGNOSIS — E1129 Type 2 diabetes mellitus with other diabetic kidney complication: Secondary | ICD-10-CM | POA: Diagnosis not present

## 2016-01-18 DIAGNOSIS — D631 Anemia in chronic kidney disease: Secondary | ICD-10-CM | POA: Diagnosis not present

## 2016-01-18 DIAGNOSIS — N186 End stage renal disease: Secondary | ICD-10-CM | POA: Diagnosis not present

## 2016-01-18 DIAGNOSIS — N2581 Secondary hyperparathyroidism of renal origin: Secondary | ICD-10-CM | POA: Diagnosis not present

## 2016-01-19 DIAGNOSIS — M65872 Other synovitis and tenosynovitis, left ankle and foot: Secondary | ICD-10-CM | POA: Diagnosis not present

## 2016-01-19 DIAGNOSIS — M71572 Other bursitis, not elsewhere classified, left ankle and foot: Secondary | ICD-10-CM | POA: Diagnosis not present

## 2016-01-19 DIAGNOSIS — M71372 Other bursal cyst, left ankle and foot: Secondary | ICD-10-CM | POA: Diagnosis not present

## 2016-01-20 DIAGNOSIS — E1129 Type 2 diabetes mellitus with other diabetic kidney complication: Secondary | ICD-10-CM | POA: Diagnosis not present

## 2016-01-20 DIAGNOSIS — D631 Anemia in chronic kidney disease: Secondary | ICD-10-CM | POA: Diagnosis not present

## 2016-01-20 DIAGNOSIS — N2581 Secondary hyperparathyroidism of renal origin: Secondary | ICD-10-CM | POA: Diagnosis not present

## 2016-01-20 DIAGNOSIS — N186 End stage renal disease: Secondary | ICD-10-CM | POA: Diagnosis not present

## 2016-01-23 DIAGNOSIS — D631 Anemia in chronic kidney disease: Secondary | ICD-10-CM | POA: Diagnosis not present

## 2016-01-23 DIAGNOSIS — E1129 Type 2 diabetes mellitus with other diabetic kidney complication: Secondary | ICD-10-CM | POA: Diagnosis not present

## 2016-01-23 DIAGNOSIS — N2581 Secondary hyperparathyroidism of renal origin: Secondary | ICD-10-CM | POA: Diagnosis not present

## 2016-01-23 DIAGNOSIS — N186 End stage renal disease: Secondary | ICD-10-CM | POA: Diagnosis not present

## 2016-01-25 DIAGNOSIS — E1129 Type 2 diabetes mellitus with other diabetic kidney complication: Secondary | ICD-10-CM | POA: Diagnosis not present

## 2016-01-25 DIAGNOSIS — D631 Anemia in chronic kidney disease: Secondary | ICD-10-CM | POA: Diagnosis not present

## 2016-01-25 DIAGNOSIS — N2581 Secondary hyperparathyroidism of renal origin: Secondary | ICD-10-CM | POA: Diagnosis not present

## 2016-01-25 DIAGNOSIS — N186 End stage renal disease: Secondary | ICD-10-CM | POA: Diagnosis not present

## 2016-01-26 ENCOUNTER — Encounter: Payer: Self-pay | Admitting: Cardiology

## 2016-01-27 DIAGNOSIS — D631 Anemia in chronic kidney disease: Secondary | ICD-10-CM | POA: Diagnosis not present

## 2016-01-27 DIAGNOSIS — N2581 Secondary hyperparathyroidism of renal origin: Secondary | ICD-10-CM | POA: Diagnosis not present

## 2016-01-27 DIAGNOSIS — E1129 Type 2 diabetes mellitus with other diabetic kidney complication: Secondary | ICD-10-CM | POA: Diagnosis not present

## 2016-01-27 DIAGNOSIS — N186 End stage renal disease: Secondary | ICD-10-CM | POA: Diagnosis not present

## 2016-01-30 DIAGNOSIS — N186 End stage renal disease: Secondary | ICD-10-CM | POA: Diagnosis not present

## 2016-01-30 DIAGNOSIS — E1129 Type 2 diabetes mellitus with other diabetic kidney complication: Secondary | ICD-10-CM | POA: Diagnosis not present

## 2016-01-30 DIAGNOSIS — D631 Anemia in chronic kidney disease: Secondary | ICD-10-CM | POA: Diagnosis not present

## 2016-01-30 DIAGNOSIS — N2581 Secondary hyperparathyroidism of renal origin: Secondary | ICD-10-CM | POA: Diagnosis not present

## 2016-01-31 DIAGNOSIS — Z992 Dependence on renal dialysis: Secondary | ICD-10-CM | POA: Diagnosis not present

## 2016-01-31 DIAGNOSIS — N186 End stage renal disease: Secondary | ICD-10-CM | POA: Diagnosis not present

## 2016-01-31 DIAGNOSIS — I120 Hypertensive chronic kidney disease with stage 5 chronic kidney disease or end stage renal disease: Secondary | ICD-10-CM | POA: Diagnosis not present

## 2016-02-01 DIAGNOSIS — E1129 Type 2 diabetes mellitus with other diabetic kidney complication: Secondary | ICD-10-CM | POA: Diagnosis not present

## 2016-02-01 DIAGNOSIS — D631 Anemia in chronic kidney disease: Secondary | ICD-10-CM | POA: Diagnosis not present

## 2016-02-01 DIAGNOSIS — N2581 Secondary hyperparathyroidism of renal origin: Secondary | ICD-10-CM | POA: Diagnosis not present

## 2016-02-01 DIAGNOSIS — N186 End stage renal disease: Secondary | ICD-10-CM | POA: Diagnosis not present

## 2016-02-03 DIAGNOSIS — N186 End stage renal disease: Secondary | ICD-10-CM | POA: Diagnosis not present

## 2016-02-03 DIAGNOSIS — D631 Anemia in chronic kidney disease: Secondary | ICD-10-CM | POA: Diagnosis not present

## 2016-02-03 DIAGNOSIS — E1129 Type 2 diabetes mellitus with other diabetic kidney complication: Secondary | ICD-10-CM | POA: Diagnosis not present

## 2016-02-03 DIAGNOSIS — N2581 Secondary hyperparathyroidism of renal origin: Secondary | ICD-10-CM | POA: Diagnosis not present

## 2016-02-06 DIAGNOSIS — E1129 Type 2 diabetes mellitus with other diabetic kidney complication: Secondary | ICD-10-CM | POA: Diagnosis not present

## 2016-02-06 DIAGNOSIS — N186 End stage renal disease: Secondary | ICD-10-CM | POA: Diagnosis not present

## 2016-02-06 DIAGNOSIS — D631 Anemia in chronic kidney disease: Secondary | ICD-10-CM | POA: Diagnosis not present

## 2016-02-06 DIAGNOSIS — N2581 Secondary hyperparathyroidism of renal origin: Secondary | ICD-10-CM | POA: Diagnosis not present

## 2016-02-08 DIAGNOSIS — E1129 Type 2 diabetes mellitus with other diabetic kidney complication: Secondary | ICD-10-CM | POA: Diagnosis not present

## 2016-02-08 DIAGNOSIS — N2581 Secondary hyperparathyroidism of renal origin: Secondary | ICD-10-CM | POA: Diagnosis not present

## 2016-02-08 DIAGNOSIS — D631 Anemia in chronic kidney disease: Secondary | ICD-10-CM | POA: Diagnosis not present

## 2016-02-08 DIAGNOSIS — N186 End stage renal disease: Secondary | ICD-10-CM | POA: Diagnosis not present

## 2016-02-09 ENCOUNTER — Telehealth: Payer: Self-pay | Admitting: Cardiovascular Disease

## 2016-02-09 NOTE — Telephone Encounter (Signed)
Received records from [place] [facility] [place] for appointment with Dr Fletcher Anon on 02/14/16.  Records given to [facility] (medical records) for Dr Tyrell Antonio schedule on 02/14/16. lp

## 2016-02-10 DIAGNOSIS — E1129 Type 2 diabetes mellitus with other diabetic kidney complication: Secondary | ICD-10-CM | POA: Diagnosis not present

## 2016-02-10 DIAGNOSIS — D631 Anemia in chronic kidney disease: Secondary | ICD-10-CM | POA: Diagnosis not present

## 2016-02-10 DIAGNOSIS — N2581 Secondary hyperparathyroidism of renal origin: Secondary | ICD-10-CM | POA: Diagnosis not present

## 2016-02-10 DIAGNOSIS — N186 End stage renal disease: Secondary | ICD-10-CM | POA: Diagnosis not present

## 2016-02-13 DIAGNOSIS — N186 End stage renal disease: Secondary | ICD-10-CM | POA: Diagnosis not present

## 2016-02-13 DIAGNOSIS — E1129 Type 2 diabetes mellitus with other diabetic kidney complication: Secondary | ICD-10-CM | POA: Diagnosis not present

## 2016-02-13 DIAGNOSIS — D631 Anemia in chronic kidney disease: Secondary | ICD-10-CM | POA: Diagnosis not present

## 2016-02-13 DIAGNOSIS — N2581 Secondary hyperparathyroidism of renal origin: Secondary | ICD-10-CM | POA: Diagnosis not present

## 2016-02-14 ENCOUNTER — Other Ambulatory Visit: Payer: Self-pay | Admitting: Cardiovascular Disease

## 2016-02-14 ENCOUNTER — Encounter: Payer: Self-pay | Admitting: Cardiovascular Disease

## 2016-02-14 ENCOUNTER — Ambulatory Visit (HOSPITAL_COMMUNITY)
Admission: RE | Admit: 2016-02-14 | Discharge: 2016-02-14 | Disposition: A | Discharge status: Home / Self Care | Payer: Medicare Other | Source: Ambulatory Visit | Attending: Cardiology | Admitting: Cardiology

## 2016-02-14 ENCOUNTER — Ambulatory Visit (INDEPENDENT_AMBULATORY_CARE_PROVIDER_SITE_OTHER): Payer: Medicare Other | Admitting: Cardiovascular Disease

## 2016-02-14 VITALS — BP 210/78 | HR 64 | Ht <= 58 in | Wt 134.8 lb

## 2016-02-14 DIAGNOSIS — Z87891 Personal history of nicotine dependence: Secondary | ICD-10-CM | POA: Diagnosis not present

## 2016-02-14 DIAGNOSIS — E119 Type 2 diabetes mellitus without complications: Secondary | ICD-10-CM | POA: Insufficient documentation

## 2016-02-14 DIAGNOSIS — I739 Peripheral vascular disease, unspecified: Secondary | ICD-10-CM

## 2016-02-14 DIAGNOSIS — I70201 Unspecified atherosclerosis of native arteries of extremities, right leg: Secondary | ICD-10-CM | POA: Insufficient documentation

## 2016-02-14 DIAGNOSIS — I10 Essential (primary) hypertension: Secondary | ICD-10-CM | POA: Diagnosis not present

## 2016-02-14 DIAGNOSIS — Z95828 Presence of other vascular implants and grafts: Secondary | ICD-10-CM | POA: Insufficient documentation

## 2016-02-14 DIAGNOSIS — J449 Chronic obstructive pulmonary disease, unspecified: Secondary | ICD-10-CM | POA: Diagnosis not present

## 2016-02-14 NOTE — Assessment & Plan Note (Addendum)
The patient has known history of peripheral arterial disease with previous bypass involving the left leg. By physical exam, her femoral pulses are mildly diminished bilaterally and distal pedal pulses are not palpable. She has no symptoms of claudication or lower extremity ulceration. However, her functional capacity is very limited and she might not get symptoms. I'm going to obtain lower extremity arterial Doppler/duplex. If ABI and toe pressures are reasonable, she can proceed with removal of ganglion cyst. She has no clinical indication for angiography and revascularization of the present time.

## 2016-02-14 NOTE — Assessment & Plan Note (Signed)
Blood pressure is elevated today but she did not take any of her antihypertensive medications this morning. I asked her to take the medicine as she gets home.
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
## 2016-02-14 NOTE — Patient Instructions (Signed)
Medication Instructions:  Your physician recommends that you continue on your current medications as directed. Please refer to the Current Medication list given to you today.  Labwork: No new orders.   Testing/Procedures: Your physician has requested that you have a lower extremity arterial doppler. This test is an ultrasound of the arteries in the legs. It looks at arterial blood flow in the legs. Allow one hour for Lower  Arterial scans. There are no restrictions or special instructions (PLEASE PERFORM TODAY)  Follow-Up: Your physician recommends that you schedule a follow-up appointment as needed with Dr Fletcher Anon.    Any Other Special Instructions Will Be Listed Below (If Applicable).     If you need a refill on your cardiac medications before your next appointment, please call your pharmacy.

## 2016-02-15 DIAGNOSIS — N186 End stage renal disease: Secondary | ICD-10-CM | POA: Diagnosis not present

## 2016-02-15 DIAGNOSIS — N2581 Secondary hyperparathyroidism of renal origin: Secondary | ICD-10-CM | POA: Diagnosis not present

## 2016-02-15 DIAGNOSIS — D631 Anemia in chronic kidney disease: Secondary | ICD-10-CM | POA: Diagnosis not present

## 2016-02-15 DIAGNOSIS — E1129 Type 2 diabetes mellitus with other diabetic kidney complication: Secondary | ICD-10-CM | POA: Diagnosis not present

## 2016-02-17 DIAGNOSIS — E1129 Type 2 diabetes mellitus with other diabetic kidney complication: Secondary | ICD-10-CM | POA: Diagnosis not present

## 2016-02-17 DIAGNOSIS — D631 Anemia in chronic kidney disease: Secondary | ICD-10-CM | POA: Diagnosis not present

## 2016-02-17 DIAGNOSIS — N186 End stage renal disease: Secondary | ICD-10-CM | POA: Diagnosis not present

## 2016-02-17 DIAGNOSIS — N2581 Secondary hyperparathyroidism of renal origin: Secondary | ICD-10-CM | POA: Diagnosis not present

## 2016-02-20 ENCOUNTER — Telehealth: Payer: Self-pay | Admitting: Cardiovascular Disease

## 2016-02-20 DIAGNOSIS — D631 Anemia in chronic kidney disease: Secondary | ICD-10-CM | POA: Diagnosis not present

## 2016-02-20 DIAGNOSIS — N2581 Secondary hyperparathyroidism of renal origin: Secondary | ICD-10-CM | POA: Diagnosis not present

## 2016-02-20 DIAGNOSIS — E1129 Type 2 diabetes mellitus with other diabetic kidney complication: Secondary | ICD-10-CM | POA: Diagnosis not present

## 2016-02-20 DIAGNOSIS — N186 End stage renal disease: Secondary | ICD-10-CM | POA: Diagnosis not present

## 2016-02-20 NOTE — Telephone Encounter (Signed)
Returning your call from Thursday.

## 2016-02-20 NOTE — Telephone Encounter (Signed)
Left message on machine for Rosa to contact the office.

## 2016-02-20 NOTE — Telephone Encounter (Signed)
I spoke with the pt's daughter [name] and made her aware of the pt's LEA test results. I will forward these results to Dr Barkley Bruns.

## 2016-02-22 DIAGNOSIS — N186 End stage renal disease: Secondary | ICD-10-CM | POA: Diagnosis not present

## 2016-02-22 DIAGNOSIS — N2581 Secondary hyperparathyroidism of renal origin: Secondary | ICD-10-CM | POA: Diagnosis not present

## 2016-02-22 DIAGNOSIS — D631 Anemia in chronic kidney disease: Secondary | ICD-10-CM | POA: Diagnosis not present

## 2016-02-22 DIAGNOSIS — E1129 Type 2 diabetes mellitus with other diabetic kidney complication: Secondary | ICD-10-CM | POA: Diagnosis not present

## 2016-02-24 DIAGNOSIS — E1129 Type 2 diabetes mellitus with other diabetic kidney complication: Secondary | ICD-10-CM | POA: Diagnosis not present

## 2016-02-24 DIAGNOSIS — N2581 Secondary hyperparathyroidism of renal origin: Secondary | ICD-10-CM | POA: Diagnosis not present

## 2016-02-24 DIAGNOSIS — D631 Anemia in chronic kidney disease: Secondary | ICD-10-CM | POA: Diagnosis not present

## 2016-02-24 DIAGNOSIS — N186 End stage renal disease: Secondary | ICD-10-CM | POA: Diagnosis not present

## 2016-02-27 DIAGNOSIS — N186 End stage renal disease: Secondary | ICD-10-CM | POA: Diagnosis not present

## 2016-02-27 DIAGNOSIS — D631 Anemia in chronic kidney disease: Secondary | ICD-10-CM | POA: Diagnosis not present

## 2016-02-27 DIAGNOSIS — N2581 Secondary hyperparathyroidism of renal origin: Secondary | ICD-10-CM | POA: Diagnosis not present

## 2016-02-27 DIAGNOSIS — E1129 Type 2 diabetes mellitus with other diabetic kidney complication: Secondary | ICD-10-CM | POA: Diagnosis not present

## 2016-02-28 DIAGNOSIS — Z992 Dependence on renal dialysis: Secondary | ICD-10-CM | POA: Diagnosis not present

## 2016-02-28 DIAGNOSIS — I120 Hypertensive chronic kidney disease with stage 5 chronic kidney disease or end stage renal disease: Secondary | ICD-10-CM | POA: Diagnosis not present

## 2016-02-28 DIAGNOSIS — N186 End stage renal disease: Secondary | ICD-10-CM | POA: Diagnosis not present

## 2016-02-29 DIAGNOSIS — D631 Anemia in chronic kidney disease: Secondary | ICD-10-CM | POA: Diagnosis not present

## 2016-02-29 DIAGNOSIS — N186 End stage renal disease: Secondary | ICD-10-CM | POA: Diagnosis not present

## 2016-02-29 DIAGNOSIS — E1129 Type 2 diabetes mellitus with other diabetic kidney complication: Secondary | ICD-10-CM | POA: Diagnosis not present

## 2016-02-29 DIAGNOSIS — N2581 Secondary hyperparathyroidism of renal origin: Secondary | ICD-10-CM | POA: Diagnosis not present

## 2016-03-02 DIAGNOSIS — S9032XD Contusion of left foot, subsequent encounter: Secondary | ICD-10-CM | POA: Diagnosis not present

## 2016-03-02 DIAGNOSIS — D631 Anemia in chronic kidney disease: Secondary | ICD-10-CM | POA: Diagnosis not present

## 2016-03-02 DIAGNOSIS — M12272 Villonodular synovitis (pigmented), left ankle and foot: Secondary | ICD-10-CM | POA: Diagnosis not present

## 2016-03-02 DIAGNOSIS — N186 End stage renal disease: Secondary | ICD-10-CM | POA: Diagnosis not present

## 2016-03-02 DIAGNOSIS — N2581 Secondary hyperparathyroidism of renal origin: Secondary | ICD-10-CM | POA: Diagnosis not present

## 2016-03-02 DIAGNOSIS — E1129 Type 2 diabetes mellitus with other diabetic kidney complication: Secondary | ICD-10-CM | POA: Diagnosis not present

## 2016-03-05 DIAGNOSIS — N186 End stage renal disease: Secondary | ICD-10-CM | POA: Diagnosis not present

## 2016-03-05 DIAGNOSIS — N2581 Secondary hyperparathyroidism of renal origin: Secondary | ICD-10-CM | POA: Diagnosis not present

## 2016-03-05 DIAGNOSIS — D631 Anemia in chronic kidney disease: Secondary | ICD-10-CM | POA: Diagnosis not present

## 2016-03-05 DIAGNOSIS — E1129 Type 2 diabetes mellitus with other diabetic kidney complication: Secondary | ICD-10-CM | POA: Diagnosis not present

## 2016-03-06 DIAGNOSIS — H5213 Myopia, bilateral: Secondary | ICD-10-CM | POA: Diagnosis not present

## 2016-03-06 DIAGNOSIS — H40011 Open angle with borderline findings, low risk, right eye: Secondary | ICD-10-CM | POA: Diagnosis not present

## 2016-03-06 DIAGNOSIS — H40012 Open angle with borderline findings, low risk, left eye: Secondary | ICD-10-CM | POA: Diagnosis not present

## 2016-03-06 DIAGNOSIS — E113292 Type 2 diabetes mellitus with mild nonproliferative diabetic retinopathy without macular edema, left eye: Secondary | ICD-10-CM | POA: Diagnosis not present

## 2016-03-07 DIAGNOSIS — N2581 Secondary hyperparathyroidism of renal origin: Secondary | ICD-10-CM | POA: Diagnosis not present

## 2016-03-07 DIAGNOSIS — D631 Anemia in chronic kidney disease: Secondary | ICD-10-CM | POA: Diagnosis not present

## 2016-03-07 DIAGNOSIS — E1129 Type 2 diabetes mellitus with other diabetic kidney complication: Secondary | ICD-10-CM | POA: Diagnosis not present

## 2016-03-07 DIAGNOSIS — N186 End stage renal disease: Secondary | ICD-10-CM | POA: Diagnosis not present

## 2016-03-08 ENCOUNTER — Ambulatory Visit (INDEPENDENT_AMBULATORY_CARE_PROVIDER_SITE_OTHER): Payer: Medicare Other | Admitting: Cardiology

## 2016-03-08 ENCOUNTER — Encounter: Payer: Self-pay | Admitting: Cardiology

## 2016-03-08 VITALS — BP 124/62 | HR 56 | Ht <= 58 in | Wt 137.0 lb

## 2016-03-08 DIAGNOSIS — I272 Other secondary pulmonary hypertension: Secondary | ICD-10-CM

## 2016-03-08 DIAGNOSIS — I739 Peripheral vascular disease, unspecified: Secondary | ICD-10-CM | POA: Diagnosis not present

## 2016-03-08 DIAGNOSIS — R0609 Other forms of dyspnea: Secondary | ICD-10-CM | POA: Diagnosis not present

## 2016-03-08 DIAGNOSIS — I10 Essential (primary) hypertension: Secondary | ICD-10-CM | POA: Diagnosis not present

## 2016-03-08 DIAGNOSIS — R0600 Dyspnea, unspecified: Secondary | ICD-10-CM

## 2016-03-08 MED ORDER — BISACODYL 5 MG PO TBEC: 5.0000 mg | DELAYED_RELEASE_TABLET | Freq: Every day | ORAL | Status: DC

## 2016-03-08 NOTE — Progress Notes (Signed)
Patient ID: [name], female   DOB: Jul 20, 1934, 82 y.o.   MRN: [id number]      Cardiology Office Note  Date:  03/08/2016   ID:  [name], DOB 1934/09/05, MRN [id number]  PCP:  Leola Brazil, MD  Cardiologist: Tashana Spark, MD   Chief complain: DOE   History of Present Illness: [name] is a 82 y.o. female who presents for evaluation of DOE. She has h/o ESRD on HD for the last 10 years. She has been noticing worsening DOE, now after walking from room to room. No chest pain, palpitations or syncope. She had a negative stress test in October 2016. Echocardiogram showed normal LVEF with grade 1 diastolic dysfunction and mild pulmonary HTN, her clonidine was decreased and she was started on amlodipine 2.5 mg po daily. She wa also complaining of claudications and followed with Dr Fletcher Anon who ordered LE arterial Doppler.  Left toe pressure is normal and bypass on that side is patent. Thus, Dr Fletcher Anon stated that she can proceed with cyst removal on that food if needed. The patient should come back to see me if there is slow healing after. She states that her ganglion cyst is currently not causing her much trouble and she holding off. Her DOE has slightly improved, she denies LE edema, orthopnea or PND.   Past Medical History  Diagnosis Date  . Hypertension   . Gout, unspecified 1980's    "not anymore" (03/09/2014)  . Mild mitral valve stenosis   . Gangrene ([place])     left fifth toe  . Peripheral vascular disease ([place])   . Hyperlipidemia   . Aortic valve sclerosis   . Shoulder fracture, right 2012  . Blood transfusion 1960's  . Chronic back pain   . Other specified cardiac dysrhythmias(427.89)     sees Dr. Angelena Form  . Heart murmur     "I think so" (11/25/2012)  . Anginal pain ([place])   . Anemia   . Exertional shortness of breath   . Asthma     "used to; not anymore" (03/09/2014)  . Pneumonia     "once; years ago" (03/09/2014)  . Type II diabetes mellitus ([place])       "not on any medication at this time" (03/09/2014)  . GERD (gastroesophageal reflux disease)     hx (03/09/2014)  . Arthritis     "all over" (03/09/2014)  . ESRD (end stage renal disease) on dialysis ([facility])     M, W, [facility] dialysis (03/09/2014)    Past Surgical History  Procedure Laterality Date  . Av fistula repair Bilateral     "right/left arm fistula failed; removed left thigh d/t poor circulation" (11/25/2012)  . Arteriovenous graft placement Left     femoral loop arteriovenous Gore-Tex graft.  . Av fistula placement  2008- 2013    "left upper arm; twice in my neck; left leg; removed from left leg; right upper arm" (11/25/2012)  . Foot amputation through metatarsal Left 06/22/11    "whole 5th toe" (11/25/2012)  . Pr vein bypass graft,aorto-fem-pop  06/14/2011  . Femoral-popliteal bypass graft  04/11/2012    Procedure: BYPASS GRAFT FEMORAL-POPLITEAL ARTERY;  Surgeon: [name], MD;  Location: [facility];  Service: Vascular;  Laterality: Left;  Thrombectomy/Left femoral-popliteal bypass with revision of proximal end and shortening of graft; intraoperative arteriogram; endarterectomy and patch angioplasty with distal anastomosis  . Appendectomy  1960's  . Femoral-popliteal bypass graft  10/09/2012  Procedure: BYPASS GRAFT FEMORAL-POPLITEAL ARTERY;  Surgeon: [name], MD;  Location: [facility];  Service: Vascular;  Laterality: Left;  Redo left tibioperoneal trunk bypass with Gortex Graft 8mmx80cm.  . Total abdominal hysterectomy  1960's    one ovary removed  . Kyphoplasty  11/25/2012    Procedure: KYPHOPLASTY;  Surgeon: Kristeen Miss, MD;  Location: [facility];  Service: Neurosurgery;  Laterality: N/A;  Lumbar two lumbar five Kyphoplasty  . Colonoscopy  2014?  Marland Kitchen Cataract extraction, bilateral Bilateral   . Exchange of a dialysis catheter Right 06/11/2013    Procedure: EXCHANGE OF A DIALYSIS CATHETER;  Surgeon: Conrad Shamokin Dam, MD;  Location: [facility];  Service: Vascular;   Laterality: Right;  . Fistulogram Right 06/11/2013    Procedure: Venogram with angioplasty;  Surgeon: Conrad Anawalt, MD;  Location: [facility];  Service: Vascular;  Laterality: Right;  RIGHT CENTRAL VENOGRAM WITH ANGIOPLASTY  . Esophagogastroduodenoscopy N/A 09/10/2013    Procedure: ESOPHAGOGASTRODUODENOSCOPY (EGD);  Surgeon: [name], MD;  [name];  Service: Endoscopy;  Laterality: N/A;  . Eye surgery    . Av fistula placement Right 03/09/2014    Procedure: RIGHT AXILLARY EXPLORATION; PARTIAL REMOVOAL OF OLD ARTERIOVENOUS (AV) GORE-TEX GRAFT; LIGATION OF RIGHT AXILLARY VEIN; ULTRASOUND GUIDED;  Surgeon: Conrad [place], MD;  Location: [facility];  Service: Vascular;  Laterality: Right;  . Insertion of dialysis catheter Right 03/10/2014    Procedure: INSERTION OF TUNNELED  DIALYSIS CATHETER -attempted  RIGHT SUBCLAVIAN, RIGHT FEMORAL TUNNELED DIALYSIS CATHETER EXCHANGE with Inferior Vena Cava gram and Fibrin Sheath Angioplasty;  Surgeon: Conrad Grainger, MD;  Location: [facility];  Service: Vascular;  Laterality: Right;   Current Outpatient Prescriptions  Medication Sig Dispense Refill  . amLODipine (NORVASC) 2.5 MG tablet Take 1 tablet (2.5 mg total) by mouth daily. 30 tablet 11  . aspirin 81 MG tablet Take 81 mg by mouth daily.    . calcium acetate (PHOSLO) 667 MG capsule Take 2 capsules by mouth 2 (two) times daily with a meal.  11  . cloNIDine (CATAPRES) 0.1 MG tablet Take 1 tablet (0.1 mg total) by mouth daily. 30 tablet 11  . HYDROcodone-acetaminophen (NORCO/VICODIN) 5-325 MG tablet Take 1 tablet by mouth as needed.  0  . metoprolol succinate (TOPROL-XL) 50 MG 24 hr tablet Take 50 mg by mouth daily. Take with or immediately following a meal.    . pantoprazole (PROTONIX) 40 MG tablet Take 40 mg by mouth 2 (two) times daily.     No current facility-administered medications for this visit.   Allergies:   Ace inhibitors   Social History:  The patient  reports that she has quit smoking. Her  smoking use included Cigarettes. She has a 1.8 pack-year smoking history. She has never used smokeless tobacco. She reports that she does not drink alcohol or use illicit drugs.   Family History:  The patient's family history includes COPD in her sister; Heart attack in her sister; Hypertension in her mother; Peripheral vascular disease in her mother.   ROS:  Please see the history of present illness.   Otherwise, review of systems are positive for none.   All other systems are reviewed and negative.   PHYSICAL EXAM: VS:  BP 124/62 mmHg  Pulse 56  Ht 4\' 10"  (1.473 m)  Wt 137 lb (62.143 kg)  BMI 28.64 kg/m2 , BMI Body mass index is 28.64 kg/(m^2). GEN: Well nourished, well developed, in no acute distress HEENT: normal Neck:  no JVD, carotid bruits, or masses Cardiac: RRR; 2/6 systolic murmur murmurs, rubs, or gallops,no edema  Respiratory:  clear to auscultation bilaterally, normal work of breathing GI: soft, nontender, nondistended, + BS MS: no deformity or atrophy Skin: warm and dry, no rash Neuro:  Strength and sensation are intact Psych: euthymic mood, full affect  EKG:  EKG is ordered today. The ekg ordered today demonstrates SR, negative T waves in the inferolateral leads.   Recent Labs: 05/10/2015: BUN 52*; Creatinine, Ser 8.97*; Hemoglobin 10.9*; Platelets 135*; Potassium 5.4*; Sodium 137   Lipid Panel No results found for: CHOL, TRIG, HDL, CHOLHDL, VLDL, LDLCALC, LDLDIRECT   Wt Readings from Last 3 Encounters:  03/08/16 137 lb (62.143 kg)  02/14/16 134 lb 12.8 oz (61.145 kg)  12/08/15 136 lb (61.689 kg)    TTE: 2013 Left ventricle: The cavity size was normal. Wall thickness was normal. Systolic function was normal. The estimated ejection fraction was in the range of 50% to 55%. - Tricuspid valve: Severe regurgitation. - Pulmonary arteries: Systolic pressure was mildly increased. PA peak pressure: 70mm Hg (S).  TTE 12/2015 - Left ventricle: The cavity size  was normal. There was mild focal  basal hypertrophy of the septum. Systolic function was vigorous.  The estimated ejection fraction was in the range of 65% to 70%.  Wall motion was normal; there were no regional wall motion  abnormalities. Doppler parameters are consistent with abnormal  left ventricular relaxation (grade 1 diastolic dysfunction).  Doppler parameters are consistent with elevated ventricular  end-diastolic filling pressure. - Aortic valve: Trileaflet; moderately thickened, moderately  calcified leaflets. There was no regurgitation. - Aortic root: The aortic root was normal in size. - Mitral valve: Calcified annulus. Mildly thickened leaflets . - Right ventricle: Systolic function was normal. - Right atrium: The atrium was normal in size. - Tricuspid valve: There was mild regurgitation. - Pulmonic valve: There was no regurgitation. - Pulmonary arteries: Systolic pressure was mildly increased. PA  peak pressure: 44 mm Hg (S). - Inferior vena cava: The vessel was normal in size. - Pericardium, extracardiac: There was no pericardial effusion.  Impressions:  - There is no significant change from prior study in 2013.  Lexiscan nuclear stress test December 2016  Nuclear stress EF: 64%. Septal wall hypokinesis.  There was no ST segment deviation noted during stress.  No ischemia.  This is a low risk study.    ASSESSMENT AND PLAN:  1.  DOE - negative Lexiscan nuclear stress test in 10/2015. Mild pulmonary hypertension started on amlodipine with improvement of symptoms.  2. Hypertension - controlled  3. Pulmonary hypertension - as above.  4. PAD - followed by Dr Fletcher Anon, stable findings on most recent LE arterial Duplex - Please forward results to Dr. Barkley Bruns. Left toe pressure is normal and bypass on that side is patent. Thus, he can proceed with cyst removal on that food if needed.   Follow up in 6 months.   Signed, Amybeth Spark, MD  03/08/2016  11:25 AM    [facility] [address], [place], [place]  [address] Phone: [phone]; Fax: [phone]

## 2016-03-08 NOTE — Patient Instructions (Signed)
Your physician has recommended you make the following change in your medication:   [name]      Your physician wants you to follow-up in: [place] will receive a reminder letter in the mail two months in advance. If you don't receive a letter, please call our office to schedule the follow-up appointment.

## 2016-03-09 DIAGNOSIS — N186 End stage renal disease: Secondary | ICD-10-CM | POA: Diagnosis not present

## 2016-03-09 DIAGNOSIS — D631 Anemia in chronic kidney disease: Secondary | ICD-10-CM | POA: Diagnosis not present

## 2016-03-09 DIAGNOSIS — N2581 Secondary hyperparathyroidism of renal origin: Secondary | ICD-10-CM | POA: Diagnosis not present

## 2016-03-09 DIAGNOSIS — E1129 Type 2 diabetes mellitus with other diabetic kidney complication: Secondary | ICD-10-CM | POA: Diagnosis not present

## 2016-03-12 DIAGNOSIS — N2581 Secondary hyperparathyroidism of renal origin: Secondary | ICD-10-CM | POA: Diagnosis not present

## 2016-03-12 DIAGNOSIS — N186 End stage renal disease: Secondary | ICD-10-CM | POA: Diagnosis not present

## 2016-03-12 DIAGNOSIS — D631 Anemia in chronic kidney disease: Secondary | ICD-10-CM | POA: Diagnosis not present

## 2016-03-12 DIAGNOSIS — E1129 Type 2 diabetes mellitus with other diabetic kidney complication: Secondary | ICD-10-CM | POA: Diagnosis not present

## 2016-03-14 DIAGNOSIS — D631 Anemia in chronic kidney disease: Secondary | ICD-10-CM | POA: Diagnosis not present

## 2016-03-14 DIAGNOSIS — E1129 Type 2 diabetes mellitus with other diabetic kidney complication: Secondary | ICD-10-CM | POA: Diagnosis not present

## 2016-03-14 DIAGNOSIS — N186 End stage renal disease: Secondary | ICD-10-CM | POA: Diagnosis not present

## 2016-03-14 DIAGNOSIS — N2581 Secondary hyperparathyroidism of renal origin: Secondary | ICD-10-CM | POA: Diagnosis not present

## 2016-03-16 DIAGNOSIS — N2581 Secondary hyperparathyroidism of renal origin: Secondary | ICD-10-CM | POA: Diagnosis not present

## 2016-03-16 DIAGNOSIS — N186 End stage renal disease: Secondary | ICD-10-CM | POA: Diagnosis not present

## 2016-03-16 DIAGNOSIS — D631 Anemia in chronic kidney disease: Secondary | ICD-10-CM | POA: Diagnosis not present

## 2016-03-16 DIAGNOSIS — E1129 Type 2 diabetes mellitus with other diabetic kidney complication: Secondary | ICD-10-CM | POA: Diagnosis not present

## 2016-03-19 DIAGNOSIS — N186 End stage renal disease: Secondary | ICD-10-CM | POA: Diagnosis not present

## 2016-03-19 DIAGNOSIS — D631 Anemia in chronic kidney disease: Secondary | ICD-10-CM | POA: Diagnosis not present

## 2016-03-19 DIAGNOSIS — N2581 Secondary hyperparathyroidism of renal origin: Secondary | ICD-10-CM | POA: Diagnosis not present

## 2016-03-19 DIAGNOSIS — E1129 Type 2 diabetes mellitus with other diabetic kidney complication: Secondary | ICD-10-CM | POA: Diagnosis not present

## 2016-03-21 DIAGNOSIS — D631 Anemia in chronic kidney disease: Secondary | ICD-10-CM | POA: Diagnosis not present

## 2016-03-21 DIAGNOSIS — E1129 Type 2 diabetes mellitus with other diabetic kidney complication: Secondary | ICD-10-CM | POA: Diagnosis not present

## 2016-03-21 DIAGNOSIS — N2581 Secondary hyperparathyroidism of renal origin: Secondary | ICD-10-CM | POA: Diagnosis not present

## 2016-03-21 DIAGNOSIS — N186 End stage renal disease: Secondary | ICD-10-CM | POA: Diagnosis not present

## 2016-03-23 DIAGNOSIS — N186 End stage renal disease: Secondary | ICD-10-CM | POA: Diagnosis not present

## 2016-03-23 DIAGNOSIS — D631 Anemia in chronic kidney disease: Secondary | ICD-10-CM | POA: Diagnosis not present

## 2016-03-23 DIAGNOSIS — N2581 Secondary hyperparathyroidism of renal origin: Secondary | ICD-10-CM | POA: Diagnosis not present

## 2016-03-23 DIAGNOSIS — E1129 Type 2 diabetes mellitus with other diabetic kidney complication: Secondary | ICD-10-CM | POA: Diagnosis not present

## 2016-03-26 DIAGNOSIS — E1129 Type 2 diabetes mellitus with other diabetic kidney complication: Secondary | ICD-10-CM | POA: Diagnosis not present

## 2016-03-26 DIAGNOSIS — N186 End stage renal disease: Secondary | ICD-10-CM | POA: Diagnosis not present

## 2016-03-26 DIAGNOSIS — N2581 Secondary hyperparathyroidism of renal origin: Secondary | ICD-10-CM | POA: Diagnosis not present

## 2016-03-26 DIAGNOSIS — D631 Anemia in chronic kidney disease: Secondary | ICD-10-CM | POA: Diagnosis not present

## 2016-03-28 DIAGNOSIS — D631 Anemia in chronic kidney disease: Secondary | ICD-10-CM | POA: Diagnosis not present

## 2016-03-28 DIAGNOSIS — N2581 Secondary hyperparathyroidism of renal origin: Secondary | ICD-10-CM | POA: Diagnosis not present

## 2016-03-28 DIAGNOSIS — N186 End stage renal disease: Secondary | ICD-10-CM | POA: Diagnosis not present

## 2016-03-28 DIAGNOSIS — E1129 Type 2 diabetes mellitus with other diabetic kidney complication: Secondary | ICD-10-CM | POA: Diagnosis not present

## 2016-03-30 DIAGNOSIS — E1129 Type 2 diabetes mellitus with other diabetic kidney complication: Secondary | ICD-10-CM | POA: Diagnosis not present

## 2016-03-30 DIAGNOSIS — D631 Anemia in chronic kidney disease: Secondary | ICD-10-CM | POA: Diagnosis not present

## 2016-03-30 DIAGNOSIS — N2581 Secondary hyperparathyroidism of renal origin: Secondary | ICD-10-CM | POA: Diagnosis not present

## 2016-03-30 DIAGNOSIS — Z992 Dependence on renal dialysis: Secondary | ICD-10-CM | POA: Diagnosis not present

## 2016-03-30 DIAGNOSIS — N186 End stage renal disease: Secondary | ICD-10-CM | POA: Diagnosis not present

## 2016-03-30 DIAGNOSIS — I120 Hypertensive chronic kidney disease with stage 5 chronic kidney disease or end stage renal disease: Secondary | ICD-10-CM | POA: Diagnosis not present

## 2016-04-02 DIAGNOSIS — D509 Iron deficiency anemia, unspecified: Secondary | ICD-10-CM | POA: Diagnosis not present

## 2016-04-02 DIAGNOSIS — D631 Anemia in chronic kidney disease: Secondary | ICD-10-CM | POA: Diagnosis not present

## 2016-04-02 DIAGNOSIS — E1129 Type 2 diabetes mellitus with other diabetic kidney complication: Secondary | ICD-10-CM | POA: Diagnosis not present

## 2016-04-02 DIAGNOSIS — N186 End stage renal disease: Secondary | ICD-10-CM | POA: Diagnosis not present

## 2016-04-02 DIAGNOSIS — N2581 Secondary hyperparathyroidism of renal origin: Secondary | ICD-10-CM | POA: Diagnosis not present

## 2016-04-04 DIAGNOSIS — N2581 Secondary hyperparathyroidism of renal origin: Secondary | ICD-10-CM | POA: Diagnosis not present

## 2016-04-04 DIAGNOSIS — E1129 Type 2 diabetes mellitus with other diabetic kidney complication: Secondary | ICD-10-CM | POA: Diagnosis not present

## 2016-04-04 DIAGNOSIS — D631 Anemia in chronic kidney disease: Secondary | ICD-10-CM | POA: Diagnosis not present

## 2016-04-04 DIAGNOSIS — N186 End stage renal disease: Secondary | ICD-10-CM | POA: Diagnosis not present

## 2016-04-04 DIAGNOSIS — D509 Iron deficiency anemia, unspecified: Secondary | ICD-10-CM | POA: Diagnosis not present

## 2016-04-06 DIAGNOSIS — N186 End stage renal disease: Secondary | ICD-10-CM | POA: Diagnosis not present

## 2016-04-06 DIAGNOSIS — D509 Iron deficiency anemia, unspecified: Secondary | ICD-10-CM | POA: Diagnosis not present

## 2016-04-06 DIAGNOSIS — N2581 Secondary hyperparathyroidism of renal origin: Secondary | ICD-10-CM | POA: Diagnosis not present

## 2016-04-06 DIAGNOSIS — D631 Anemia in chronic kidney disease: Secondary | ICD-10-CM | POA: Diagnosis not present

## 2016-04-06 DIAGNOSIS — E1129 Type 2 diabetes mellitus with other diabetic kidney complication: Secondary | ICD-10-CM | POA: Diagnosis not present

## 2016-04-09 DIAGNOSIS — D631 Anemia in chronic kidney disease: Secondary | ICD-10-CM | POA: Diagnosis not present

## 2016-04-09 DIAGNOSIS — N2581 Secondary hyperparathyroidism of renal origin: Secondary | ICD-10-CM | POA: Diagnosis not present

## 2016-04-09 DIAGNOSIS — E1129 Type 2 diabetes mellitus with other diabetic kidney complication: Secondary | ICD-10-CM | POA: Diagnosis not present

## 2016-04-09 DIAGNOSIS — N186 End stage renal disease: Secondary | ICD-10-CM | POA: Diagnosis not present

## 2016-04-09 DIAGNOSIS — D509 Iron deficiency anemia, unspecified: Secondary | ICD-10-CM | POA: Diagnosis not present

## 2016-04-11 DIAGNOSIS — D631 Anemia in chronic kidney disease: Secondary | ICD-10-CM | POA: Diagnosis not present

## 2016-04-11 DIAGNOSIS — E1129 Type 2 diabetes mellitus with other diabetic kidney complication: Secondary | ICD-10-CM | POA: Diagnosis not present

## 2016-04-11 DIAGNOSIS — N2581 Secondary hyperparathyroidism of renal origin: Secondary | ICD-10-CM | POA: Diagnosis not present

## 2016-04-11 DIAGNOSIS — D509 Iron deficiency anemia, unspecified: Secondary | ICD-10-CM | POA: Diagnosis not present

## 2016-04-11 DIAGNOSIS — N186 End stage renal disease: Secondary | ICD-10-CM | POA: Diagnosis not present

## 2016-04-13 DIAGNOSIS — D631 Anemia in chronic kidney disease: Secondary | ICD-10-CM | POA: Diagnosis not present

## 2016-04-13 DIAGNOSIS — D509 Iron deficiency anemia, unspecified: Secondary | ICD-10-CM | POA: Diagnosis not present

## 2016-04-13 DIAGNOSIS — N186 End stage renal disease: Secondary | ICD-10-CM | POA: Diagnosis not present

## 2016-04-13 DIAGNOSIS — N2581 Secondary hyperparathyroidism of renal origin: Secondary | ICD-10-CM | POA: Diagnosis not present

## 2016-04-13 DIAGNOSIS — E1129 Type 2 diabetes mellitus with other diabetic kidney complication: Secondary | ICD-10-CM | POA: Diagnosis not present

## 2016-04-16 DIAGNOSIS — E1129 Type 2 diabetes mellitus with other diabetic kidney complication: Secondary | ICD-10-CM | POA: Diagnosis not present

## 2016-04-16 DIAGNOSIS — D509 Iron deficiency anemia, unspecified: Secondary | ICD-10-CM | POA: Diagnosis not present

## 2016-04-16 DIAGNOSIS — D631 Anemia in chronic kidney disease: Secondary | ICD-10-CM | POA: Diagnosis not present

## 2016-04-16 DIAGNOSIS — N186 End stage renal disease: Secondary | ICD-10-CM | POA: Diagnosis not present

## 2016-04-16 DIAGNOSIS — N2581 Secondary hyperparathyroidism of renal origin: Secondary | ICD-10-CM | POA: Diagnosis not present

## 2016-04-17 DIAGNOSIS — H40013 Open angle with borderline findings, low risk, bilateral: Secondary | ICD-10-CM | POA: Diagnosis not present

## 2016-04-18 DIAGNOSIS — E1129 Type 2 diabetes mellitus with other diabetic kidney complication: Secondary | ICD-10-CM | POA: Diagnosis not present

## 2016-04-18 DIAGNOSIS — D631 Anemia in chronic kidney disease: Secondary | ICD-10-CM | POA: Diagnosis not present

## 2016-04-18 DIAGNOSIS — N186 End stage renal disease: Secondary | ICD-10-CM | POA: Diagnosis not present

## 2016-04-18 DIAGNOSIS — D509 Iron deficiency anemia, unspecified: Secondary | ICD-10-CM | POA: Diagnosis not present

## 2016-04-18 DIAGNOSIS — N2581 Secondary hyperparathyroidism of renal origin: Secondary | ICD-10-CM | POA: Diagnosis not present

## 2016-04-20 DIAGNOSIS — N186 End stage renal disease: Secondary | ICD-10-CM | POA: Diagnosis not present

## 2016-04-20 DIAGNOSIS — N2581 Secondary hyperparathyroidism of renal origin: Secondary | ICD-10-CM | POA: Diagnosis not present

## 2016-04-20 DIAGNOSIS — D509 Iron deficiency anemia, unspecified: Secondary | ICD-10-CM | POA: Diagnosis not present

## 2016-04-20 DIAGNOSIS — E1129 Type 2 diabetes mellitus with other diabetic kidney complication: Secondary | ICD-10-CM | POA: Diagnosis not present

## 2016-04-20 DIAGNOSIS — D631 Anemia in chronic kidney disease: Secondary | ICD-10-CM | POA: Diagnosis not present

## 2016-04-23 DIAGNOSIS — N186 End stage renal disease: Secondary | ICD-10-CM | POA: Diagnosis not present

## 2016-04-23 DIAGNOSIS — D631 Anemia in chronic kidney disease: Secondary | ICD-10-CM | POA: Diagnosis not present

## 2016-04-23 DIAGNOSIS — N2581 Secondary hyperparathyroidism of renal origin: Secondary | ICD-10-CM | POA: Diagnosis not present

## 2016-04-23 DIAGNOSIS — D509 Iron deficiency anemia, unspecified: Secondary | ICD-10-CM | POA: Diagnosis not present

## 2016-04-23 DIAGNOSIS — E1129 Type 2 diabetes mellitus with other diabetic kidney complication: Secondary | ICD-10-CM | POA: Diagnosis not present

## 2016-04-25 DIAGNOSIS — D631 Anemia in chronic kidney disease: Secondary | ICD-10-CM | POA: Diagnosis not present

## 2016-04-25 DIAGNOSIS — N2581 Secondary hyperparathyroidism of renal origin: Secondary | ICD-10-CM | POA: Diagnosis not present

## 2016-04-25 DIAGNOSIS — D509 Iron deficiency anemia, unspecified: Secondary | ICD-10-CM | POA: Diagnosis not present

## 2016-04-25 DIAGNOSIS — N186 End stage renal disease: Secondary | ICD-10-CM | POA: Diagnosis not present

## 2016-04-25 DIAGNOSIS — E1129 Type 2 diabetes mellitus with other diabetic kidney complication: Secondary | ICD-10-CM | POA: Diagnosis not present

## 2016-04-27 DIAGNOSIS — E1129 Type 2 diabetes mellitus with other diabetic kidney complication: Secondary | ICD-10-CM | POA: Diagnosis not present

## 2016-04-27 DIAGNOSIS — D509 Iron deficiency anemia, unspecified: Secondary | ICD-10-CM | POA: Diagnosis not present

## 2016-04-27 DIAGNOSIS — D631 Anemia in chronic kidney disease: Secondary | ICD-10-CM | POA: Diagnosis not present

## 2016-04-27 DIAGNOSIS — N2581 Secondary hyperparathyroidism of renal origin: Secondary | ICD-10-CM | POA: Diagnosis not present

## 2016-04-27 DIAGNOSIS — N186 End stage renal disease: Secondary | ICD-10-CM | POA: Diagnosis not present

## 2016-04-29 DIAGNOSIS — N186 End stage renal disease: Secondary | ICD-10-CM | POA: Diagnosis not present

## 2016-04-29 DIAGNOSIS — I120 Hypertensive chronic kidney disease with stage 5 chronic kidney disease or end stage renal disease: Secondary | ICD-10-CM | POA: Diagnosis not present

## 2016-04-29 DIAGNOSIS — Z992 Dependence on renal dialysis: Secondary | ICD-10-CM | POA: Diagnosis not present

## 2016-04-30 DIAGNOSIS — E1129 Type 2 diabetes mellitus with other diabetic kidney complication: Secondary | ICD-10-CM | POA: Diagnosis not present

## 2016-04-30 DIAGNOSIS — D509 Iron deficiency anemia, unspecified: Secondary | ICD-10-CM | POA: Diagnosis not present

## 2016-04-30 DIAGNOSIS — D631 Anemia in chronic kidney disease: Secondary | ICD-10-CM | POA: Diagnosis not present

## 2016-04-30 DIAGNOSIS — N2581 Secondary hyperparathyroidism of renal origin: Secondary | ICD-10-CM | POA: Diagnosis not present

## 2016-04-30 DIAGNOSIS — N186 End stage renal disease: Secondary | ICD-10-CM | POA: Diagnosis not present
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
## 2016-05-02 DIAGNOSIS — N186 End stage renal disease: Secondary | ICD-10-CM | POA: Diagnosis not present

## 2016-05-02 DIAGNOSIS — D631 Anemia in chronic kidney disease: Secondary | ICD-10-CM | POA: Diagnosis not present

## 2016-05-02 DIAGNOSIS — E1129 Type 2 diabetes mellitus with other diabetic kidney complication: Secondary | ICD-10-CM | POA: Diagnosis not present

## 2016-05-02 DIAGNOSIS — D509 Iron deficiency anemia, unspecified: Secondary | ICD-10-CM | POA: Diagnosis not present

## 2016-05-02 DIAGNOSIS — N2581 Secondary hyperparathyroidism of renal origin: Secondary | ICD-10-CM | POA: Diagnosis not present

## 2016-05-04 DIAGNOSIS — N186 End stage renal disease: Secondary | ICD-10-CM | POA: Diagnosis not present

## 2016-05-04 DIAGNOSIS — D509 Iron deficiency anemia, unspecified: Secondary | ICD-10-CM | POA: Diagnosis not present

## 2016-05-04 DIAGNOSIS — D631 Anemia in chronic kidney disease: Secondary | ICD-10-CM | POA: Diagnosis not present

## 2016-05-04 DIAGNOSIS — N2581 Secondary hyperparathyroidism of renal origin: Secondary | ICD-10-CM | POA: Diagnosis not present

## 2016-05-04 DIAGNOSIS — E1129 Type 2 diabetes mellitus with other diabetic kidney complication: Secondary | ICD-10-CM | POA: Diagnosis not present

## 2016-05-07 DIAGNOSIS — D631 Anemia in chronic kidney disease: Secondary | ICD-10-CM | POA: Diagnosis not present

## 2016-05-07 DIAGNOSIS — N2581 Secondary hyperparathyroidism of renal origin: Secondary | ICD-10-CM | POA: Diagnosis not present

## 2016-05-07 DIAGNOSIS — E1129 Type 2 diabetes mellitus with other diabetic kidney complication: Secondary | ICD-10-CM | POA: Diagnosis not present

## 2016-05-07 DIAGNOSIS — N186 End stage renal disease: Secondary | ICD-10-CM | POA: Diagnosis not present

## 2016-05-07 DIAGNOSIS — D509 Iron deficiency anemia, unspecified: Secondary | ICD-10-CM | POA: Diagnosis not present

## 2016-05-09 DIAGNOSIS — E1129 Type 2 diabetes mellitus with other diabetic kidney complication: Secondary | ICD-10-CM | POA: Diagnosis not present

## 2016-05-09 DIAGNOSIS — D509 Iron deficiency anemia, unspecified: Secondary | ICD-10-CM | POA: Diagnosis not present

## 2016-05-09 DIAGNOSIS — N186 End stage renal disease: Secondary | ICD-10-CM | POA: Diagnosis not present

## 2016-05-09 DIAGNOSIS — N2581 Secondary hyperparathyroidism of renal origin: Secondary | ICD-10-CM | POA: Diagnosis not present

## 2016-05-09 DIAGNOSIS — D631 Anemia in chronic kidney disease: Secondary | ICD-10-CM | POA: Diagnosis not present

## 2016-05-11 DIAGNOSIS — E1129 Type 2 diabetes mellitus with other diabetic kidney complication: Secondary | ICD-10-CM | POA: Diagnosis not present

## 2016-05-11 DIAGNOSIS — N2581 Secondary hyperparathyroidism of renal origin: Secondary | ICD-10-CM | POA: Diagnosis not present

## 2016-05-11 DIAGNOSIS — D509 Iron deficiency anemia, unspecified: Secondary | ICD-10-CM | POA: Diagnosis not present

## 2016-05-11 DIAGNOSIS — N186 End stage renal disease: Secondary | ICD-10-CM | POA: Diagnosis not present

## 2016-05-11 DIAGNOSIS — D631 Anemia in chronic kidney disease: Secondary | ICD-10-CM | POA: Diagnosis not present

## 2016-05-14 DIAGNOSIS — D631 Anemia in chronic kidney disease: Secondary | ICD-10-CM | POA: Diagnosis not present

## 2016-05-14 DIAGNOSIS — N2581 Secondary hyperparathyroidism of renal origin: Secondary | ICD-10-CM | POA: Diagnosis not present

## 2016-05-14 DIAGNOSIS — D509 Iron deficiency anemia, unspecified: Secondary | ICD-10-CM | POA: Diagnosis not present

## 2016-05-14 DIAGNOSIS — N186 End stage renal disease: Secondary | ICD-10-CM | POA: Diagnosis not present

## 2016-05-14 DIAGNOSIS — E1129 Type 2 diabetes mellitus with other diabetic kidney complication: Secondary | ICD-10-CM | POA: Diagnosis not present

## 2016-05-16 DIAGNOSIS — D631 Anemia in chronic kidney disease: Secondary | ICD-10-CM | POA: Diagnosis not present

## 2016-05-16 DIAGNOSIS — D509 Iron deficiency anemia, unspecified: Secondary | ICD-10-CM | POA: Diagnosis not present

## 2016-05-16 DIAGNOSIS — N2581 Secondary hyperparathyroidism of renal origin: Secondary | ICD-10-CM | POA: Diagnosis not present

## 2016-05-16 DIAGNOSIS — N186 End stage renal disease: Secondary | ICD-10-CM | POA: Diagnosis not present

## 2016-05-16 DIAGNOSIS — E1129 Type 2 diabetes mellitus with other diabetic kidney complication: Secondary | ICD-10-CM | POA: Diagnosis not present

## 2016-05-18 DIAGNOSIS — E1129 Type 2 diabetes mellitus with other diabetic kidney complication: Secondary | ICD-10-CM | POA: Diagnosis not present

## 2016-05-18 DIAGNOSIS — N186 End stage renal disease: Secondary | ICD-10-CM | POA: Diagnosis not present

## 2016-05-18 DIAGNOSIS — D631 Anemia in chronic kidney disease: Secondary | ICD-10-CM | POA: Diagnosis not present

## 2016-05-18 DIAGNOSIS — N2581 Secondary hyperparathyroidism of renal origin: Secondary | ICD-10-CM | POA: Diagnosis not present

## 2016-05-18 DIAGNOSIS — D509 Iron deficiency anemia, unspecified: Secondary | ICD-10-CM | POA: Diagnosis not present

## 2016-05-21 DIAGNOSIS — E1129 Type 2 diabetes mellitus with other diabetic kidney complication: Secondary | ICD-10-CM | POA: Diagnosis not present

## 2016-05-21 DIAGNOSIS — D509 Iron deficiency anemia, unspecified: Secondary | ICD-10-CM | POA: Diagnosis not present

## 2016-05-21 DIAGNOSIS — N186 End stage renal disease: Secondary | ICD-10-CM | POA: Diagnosis not present

## 2016-05-21 DIAGNOSIS — N2581 Secondary hyperparathyroidism of renal origin: Secondary | ICD-10-CM | POA: Diagnosis not present

## 2016-05-21 DIAGNOSIS — D631 Anemia in chronic kidney disease: Secondary | ICD-10-CM | POA: Diagnosis not present

## 2016-05-23 DIAGNOSIS — D509 Iron deficiency anemia, unspecified: Secondary | ICD-10-CM | POA: Diagnosis not present

## 2016-05-23 DIAGNOSIS — N186 End stage renal disease: Secondary | ICD-10-CM | POA: Diagnosis not present

## 2016-05-23 DIAGNOSIS — N2581 Secondary hyperparathyroidism of renal origin: Secondary | ICD-10-CM | POA: Diagnosis not present

## 2016-05-23 DIAGNOSIS — E1129 Type 2 diabetes mellitus with other diabetic kidney complication: Secondary | ICD-10-CM | POA: Diagnosis not present

## 2016-05-23 DIAGNOSIS — D631 Anemia in chronic kidney disease: Secondary | ICD-10-CM | POA: Diagnosis not present

## 2016-05-25 DIAGNOSIS — N2581 Secondary hyperparathyroidism of renal origin: Secondary | ICD-10-CM | POA: Diagnosis not present

## 2016-05-25 DIAGNOSIS — E1129 Type 2 diabetes mellitus with other diabetic kidney complication: Secondary | ICD-10-CM | POA: Diagnosis not present

## 2016-05-25 DIAGNOSIS — D631 Anemia in chronic kidney disease: Secondary | ICD-10-CM | POA: Diagnosis not present

## 2016-05-25 DIAGNOSIS — D509 Iron deficiency anemia, unspecified: Secondary | ICD-10-CM | POA: Diagnosis not present

## 2016-05-25 DIAGNOSIS — N186 End stage renal disease: Secondary | ICD-10-CM | POA: Diagnosis not present

## 2016-05-28 DIAGNOSIS — D631 Anemia in chronic kidney disease: Secondary | ICD-10-CM | POA: Diagnosis not present

## 2016-05-28 DIAGNOSIS — E1129 Type 2 diabetes mellitus with other diabetic kidney complication: Secondary | ICD-10-CM | POA: Diagnosis not present

## 2016-05-28 DIAGNOSIS — N186 End stage renal disease: Secondary | ICD-10-CM | POA: Diagnosis not present

## 2016-05-28 DIAGNOSIS — D509 Iron deficiency anemia, unspecified: Secondary | ICD-10-CM | POA: Diagnosis not present

## 2016-05-28 DIAGNOSIS — N2581 Secondary hyperparathyroidism of renal origin: Secondary | ICD-10-CM | POA: Diagnosis not present

## 2016-05-30 DIAGNOSIS — Z992 Dependence on renal dialysis: Secondary | ICD-10-CM | POA: Diagnosis not present

## 2016-05-30 DIAGNOSIS — E1129 Type 2 diabetes mellitus with other diabetic kidney complication: Secondary | ICD-10-CM | POA: Diagnosis not present

## 2016-05-30 DIAGNOSIS — N186 End stage renal disease: Secondary | ICD-10-CM | POA: Diagnosis not present

## 2016-05-30 DIAGNOSIS — N2581 Secondary hyperparathyroidism of renal origin: Secondary | ICD-10-CM | POA: Diagnosis not present

## 2016-05-30 DIAGNOSIS — D509 Iron deficiency anemia, unspecified: Secondary | ICD-10-CM | POA: Diagnosis not present

## 2016-05-30 DIAGNOSIS — I120 Hypertensive chronic kidney disease with stage 5 chronic kidney disease or end stage renal disease: Secondary | ICD-10-CM | POA: Diagnosis not present

## 2016-05-30 DIAGNOSIS — D631 Anemia in chronic kidney disease: Secondary | ICD-10-CM | POA: Diagnosis not present

## 2016-06-01 DIAGNOSIS — N186 End stage renal disease: Secondary | ICD-10-CM | POA: Diagnosis not present

## 2016-06-01 DIAGNOSIS — E1129 Type 2 diabetes mellitus with other diabetic kidney complication: Secondary | ICD-10-CM | POA: Diagnosis not present

## 2016-06-01 DIAGNOSIS — N2581 Secondary hyperparathyroidism of renal origin: Secondary | ICD-10-CM | POA: Diagnosis not present

## 2016-06-04 DIAGNOSIS — N2581 Secondary hyperparathyroidism of renal origin: Secondary | ICD-10-CM | POA: Diagnosis not present

## 2016-06-04 DIAGNOSIS — E1129 Type 2 diabetes mellitus with other diabetic kidney complication: Secondary | ICD-10-CM | POA: Diagnosis not present

## 2016-06-04 DIAGNOSIS — N186 End stage renal disease: Secondary | ICD-10-CM | POA: Diagnosis not present

## 2016-06-06 DIAGNOSIS — E1129 Type 2 diabetes mellitus with other diabetic kidney complication: Secondary | ICD-10-CM | POA: Diagnosis not present

## 2016-06-06 DIAGNOSIS — N2581 Secondary hyperparathyroidism of renal origin: Secondary | ICD-10-CM | POA: Diagnosis not present

## 2016-06-06 DIAGNOSIS — N186 End stage renal disease: Secondary | ICD-10-CM | POA: Diagnosis not present

## 2016-06-08 DIAGNOSIS — N2581 Secondary hyperparathyroidism of renal origin: Secondary | ICD-10-CM | POA: Diagnosis not present

## 2016-06-08 DIAGNOSIS — N186 End stage renal disease: Secondary | ICD-10-CM | POA: Diagnosis not present

## 2016-06-08 DIAGNOSIS — E1129 Type 2 diabetes mellitus with other diabetic kidney complication: Secondary | ICD-10-CM | POA: Diagnosis not present

## 2016-06-11 DIAGNOSIS — N186 End stage renal disease: Secondary | ICD-10-CM | POA: Diagnosis not present

## 2016-06-11 DIAGNOSIS — N2581 Secondary hyperparathyroidism of renal origin: Secondary | ICD-10-CM | POA: Diagnosis not present

## 2016-06-11 DIAGNOSIS — E1129 Type 2 diabetes mellitus with other diabetic kidney complication: Secondary | ICD-10-CM | POA: Diagnosis not present

## 2016-06-13 DIAGNOSIS — N186 End stage renal disease: Secondary | ICD-10-CM | POA: Diagnosis not present

## 2016-06-13 DIAGNOSIS — N2581 Secondary hyperparathyroidism of renal origin: Secondary | ICD-10-CM | POA: Diagnosis not present

## 2016-06-13 DIAGNOSIS — E1129 Type 2 diabetes mellitus with other diabetic kidney complication: Secondary | ICD-10-CM | POA: Diagnosis not present

## 2016-06-15 DIAGNOSIS — E1129 Type 2 diabetes mellitus with other diabetic kidney complication: Secondary | ICD-10-CM | POA: Diagnosis not present

## 2016-06-15 DIAGNOSIS — N186 End stage renal disease: Secondary | ICD-10-CM | POA: Diagnosis not present

## 2016-06-15 DIAGNOSIS — N2581 Secondary hyperparathyroidism of renal origin: Secondary | ICD-10-CM | POA: Diagnosis not present

## 2016-06-18 DIAGNOSIS — N2581 Secondary hyperparathyroidism of renal origin: Secondary | ICD-10-CM | POA: Diagnosis not present

## 2016-06-18 DIAGNOSIS — N186 End stage renal disease: Secondary | ICD-10-CM | POA: Diagnosis not present

## 2016-06-18 DIAGNOSIS — E1129 Type 2 diabetes mellitus with other diabetic kidney complication: Secondary | ICD-10-CM | POA: Diagnosis not present

## 2016-06-20 DIAGNOSIS — N186 End stage renal disease: Secondary | ICD-10-CM | POA: Diagnosis not present

## 2016-06-20 DIAGNOSIS — E1129 Type 2 diabetes mellitus with other diabetic kidney complication: Secondary | ICD-10-CM | POA: Diagnosis not present

## 2016-06-20 DIAGNOSIS — N2581 Secondary hyperparathyroidism of renal origin: Secondary | ICD-10-CM | POA: Diagnosis not present

## 2016-06-22 DIAGNOSIS — N186 End stage renal disease: Secondary | ICD-10-CM | POA: Diagnosis not present

## 2016-06-22 DIAGNOSIS — N2581 Secondary hyperparathyroidism of renal origin: Secondary | ICD-10-CM | POA: Diagnosis not present

## 2016-06-22 DIAGNOSIS — E1129 Type 2 diabetes mellitus with other diabetic kidney complication: Secondary | ICD-10-CM | POA: Diagnosis not present

## 2016-06-25 DIAGNOSIS — N186 End stage renal disease: Secondary | ICD-10-CM | POA: Diagnosis not present

## 2016-06-25 DIAGNOSIS — E1129 Type 2 diabetes mellitus with other diabetic kidney complication: Secondary | ICD-10-CM | POA: Diagnosis not present

## 2016-06-25 DIAGNOSIS — N2581 Secondary hyperparathyroidism of renal origin: Secondary | ICD-10-CM | POA: Diagnosis not present

## 2016-06-27 DIAGNOSIS — N2581 Secondary hyperparathyroidism of renal origin: Secondary | ICD-10-CM | POA: Diagnosis not present

## 2016-06-27 DIAGNOSIS — E1129 Type 2 diabetes mellitus with other diabetic kidney complication: Secondary | ICD-10-CM | POA: Diagnosis not present

## 2016-06-27 DIAGNOSIS — N186 End stage renal disease: Secondary | ICD-10-CM | POA: Diagnosis not present

## 2016-06-28 DIAGNOSIS — M4806 Spinal stenosis, lumbar region: Secondary | ICD-10-CM | POA: Diagnosis not present

## 2016-06-29 DIAGNOSIS — N2581 Secondary hyperparathyroidism of renal origin: Secondary | ICD-10-CM | POA: Diagnosis not present

## 2016-06-29 DIAGNOSIS — N186 End stage renal disease: Secondary | ICD-10-CM | POA: Diagnosis not present

## 2016-06-29 DIAGNOSIS — I120 Hypertensive chronic kidney disease with stage 5 chronic kidney disease or end stage renal disease: Secondary | ICD-10-CM | POA: Diagnosis not present

## 2016-06-29 DIAGNOSIS — Z992 Dependence on renal dialysis: Secondary | ICD-10-CM | POA: Diagnosis not present

## 2016-06-29 DIAGNOSIS — E1129 Type 2 diabetes mellitus with other diabetic kidney complication: Secondary | ICD-10-CM | POA: Diagnosis not present

## 2016-07-02 DIAGNOSIS — N2581 Secondary hyperparathyroidism of renal origin: Secondary | ICD-10-CM | POA: Diagnosis not present

## 2016-07-02 DIAGNOSIS — E1129 Type 2 diabetes mellitus with other diabetic kidney complication: Secondary | ICD-10-CM | POA: Diagnosis not present

## 2016-07-02 DIAGNOSIS — N186 End stage renal disease: Secondary | ICD-10-CM | POA: Diagnosis not present

## 2016-07-02 DIAGNOSIS — D631 Anemia in chronic kidney disease: Secondary | ICD-10-CM | POA: Diagnosis not present

## 2016-07-06 DIAGNOSIS — N186 End stage renal disease: Secondary | ICD-10-CM | POA: Diagnosis not present

## 2016-07-06 DIAGNOSIS — D631 Anemia in chronic kidney disease: Secondary | ICD-10-CM | POA: Diagnosis not present

## 2016-07-06 DIAGNOSIS — N2581 Secondary hyperparathyroidism of renal origin: Secondary | ICD-10-CM | POA: Diagnosis not present

## 2016-07-06 DIAGNOSIS — E1129 Type 2 diabetes mellitus with other diabetic kidney complication: Secondary | ICD-10-CM | POA: Diagnosis not present

## 2016-07-09 DIAGNOSIS — N2581 Secondary hyperparathyroidism of renal origin: Secondary | ICD-10-CM | POA: Diagnosis not present

## 2016-07-09 DIAGNOSIS — E1129 Type 2 diabetes mellitus with other diabetic kidney complication: Secondary | ICD-10-CM | POA: Diagnosis not present

## 2016-07-09 DIAGNOSIS — D631 Anemia in chronic kidney disease: Secondary | ICD-10-CM | POA: Diagnosis not present

## 2016-07-09 DIAGNOSIS — N186 End stage renal disease: Secondary | ICD-10-CM | POA: Diagnosis not present

## 2016-07-11 DIAGNOSIS — D631 Anemia in chronic kidney disease: Secondary | ICD-10-CM | POA: Diagnosis not present

## 2016-07-11 DIAGNOSIS — N186 End stage renal disease: Secondary | ICD-10-CM | POA: Diagnosis not present

## 2016-07-11 DIAGNOSIS — E1129 Type 2 diabetes mellitus with other diabetic kidney complication: Secondary | ICD-10-CM | POA: Diagnosis not present

## 2016-07-11 DIAGNOSIS — N2581 Secondary hyperparathyroidism of renal origin: Secondary | ICD-10-CM | POA: Diagnosis not present

## 2016-07-13 DIAGNOSIS — E1129 Type 2 diabetes mellitus with other diabetic kidney complication: Secondary | ICD-10-CM | POA: Diagnosis not present

## 2016-07-13 DIAGNOSIS — D631 Anemia in chronic kidney disease: Secondary | ICD-10-CM | POA: Diagnosis not present

## 2016-07-13 DIAGNOSIS — N186 End stage renal disease: Secondary | ICD-10-CM | POA: Diagnosis not present

## 2016-07-13 DIAGNOSIS — N2581 Secondary hyperparathyroidism of renal origin: Secondary | ICD-10-CM | POA: Diagnosis not present

## 2016-07-16 DIAGNOSIS — E1129 Type 2 diabetes mellitus with other diabetic kidney complication: Secondary | ICD-10-CM | POA: Diagnosis not present

## 2016-07-16 DIAGNOSIS — N2581 Secondary hyperparathyroidism of renal origin: Secondary | ICD-10-CM | POA: Diagnosis not present

## 2016-07-16 DIAGNOSIS — D631 Anemia in chronic kidney disease: Secondary | ICD-10-CM | POA: Diagnosis not present

## 2016-07-16 DIAGNOSIS — N186 End stage renal disease: Secondary | ICD-10-CM | POA: Diagnosis not present

## 2016-07-18 DIAGNOSIS — E1129 Type 2 diabetes mellitus with other diabetic kidney complication: Secondary | ICD-10-CM | POA: Diagnosis not present

## 2016-07-18 DIAGNOSIS — N186 End stage renal disease: Secondary | ICD-10-CM | POA: Diagnosis not present

## 2016-07-18 DIAGNOSIS — D631 Anemia in chronic kidney disease: Secondary | ICD-10-CM | POA: Diagnosis not present

## 2016-07-18 DIAGNOSIS — N2581 Secondary hyperparathyroidism of renal origin: Secondary | ICD-10-CM | POA: Diagnosis not present

## 2016-07-20 DIAGNOSIS — N186 End stage renal disease: Secondary | ICD-10-CM | POA: Diagnosis not present

## 2016-07-20 DIAGNOSIS — D631 Anemia in chronic kidney disease: Secondary | ICD-10-CM | POA: Diagnosis not present

## 2016-07-20 DIAGNOSIS — N2581 Secondary hyperparathyroidism of renal origin: Secondary | ICD-10-CM | POA: Diagnosis not present

## 2016-07-20 DIAGNOSIS — E1129 Type 2 diabetes mellitus with other diabetic kidney complication: Secondary | ICD-10-CM | POA: Diagnosis not present

## 2016-07-23 DIAGNOSIS — D631 Anemia in chronic kidney disease: Secondary | ICD-10-CM | POA: Diagnosis not present

## 2016-07-23 DIAGNOSIS — E1129 Type 2 diabetes mellitus with other diabetic kidney complication: Secondary | ICD-10-CM | POA: Diagnosis not present

## 2016-07-23 DIAGNOSIS — N186 End stage renal disease: Secondary | ICD-10-CM | POA: Diagnosis not present

## 2016-07-23 DIAGNOSIS — N2581 Secondary hyperparathyroidism of renal origin: Secondary | ICD-10-CM | POA: Diagnosis not present

## 2016-07-25 DIAGNOSIS — N186 End stage renal disease: Secondary | ICD-10-CM | POA: Diagnosis not present

## 2016-07-25 DIAGNOSIS — N2581 Secondary hyperparathyroidism of renal origin: Secondary | ICD-10-CM | POA: Diagnosis not present

## 2016-07-25 DIAGNOSIS — E1129 Type 2 diabetes mellitus with other diabetic kidney complication: Secondary | ICD-10-CM | POA: Diagnosis not present

## 2016-07-25 DIAGNOSIS — D631 Anemia in chronic kidney disease: Secondary | ICD-10-CM | POA: Diagnosis not present

## 2016-07-27 DIAGNOSIS — N2581 Secondary hyperparathyroidism of renal origin: Secondary | ICD-10-CM | POA: Diagnosis not present

## 2016-07-27 DIAGNOSIS — N186 End stage renal disease: Secondary | ICD-10-CM | POA: Diagnosis not present

## 2016-07-27 DIAGNOSIS — E1129 Type 2 diabetes mellitus with other diabetic kidney complication: Secondary | ICD-10-CM | POA: Diagnosis not present

## 2016-07-27 DIAGNOSIS — D631 Anemia in chronic kidney disease: Secondary | ICD-10-CM | POA: Diagnosis not present

## 2016-07-30 DIAGNOSIS — I120 Hypertensive chronic kidney disease with stage 5 chronic kidney disease or end stage renal disease: Secondary | ICD-10-CM | POA: Diagnosis not present

## 2016-07-30 DIAGNOSIS — N2581 Secondary hyperparathyroidism of renal origin: Secondary | ICD-10-CM | POA: Diagnosis not present

## 2016-07-30 DIAGNOSIS — Z992 Dependence on renal dialysis: Secondary | ICD-10-CM | POA: Diagnosis not present

## 2016-07-30 DIAGNOSIS — D631 Anemia in chronic kidney disease: Secondary | ICD-10-CM | POA: Diagnosis not present

## 2016-07-30 DIAGNOSIS — E1129 Type 2 diabetes mellitus with other diabetic kidney complication: Secondary | ICD-10-CM | POA: Diagnosis not present

## 2016-07-30 DIAGNOSIS — N186 End stage renal disease: Secondary | ICD-10-CM | POA: Diagnosis not present

## 2016-08-01 DIAGNOSIS — E875 Hyperkalemia: Secondary | ICD-10-CM | POA: Diagnosis not present

## 2016-08-01 DIAGNOSIS — E1129 Type 2 diabetes mellitus with other diabetic kidney complication: Secondary | ICD-10-CM | POA: Diagnosis not present

## 2016-08-01 DIAGNOSIS — D631 Anemia in chronic kidney disease: Secondary | ICD-10-CM | POA: Diagnosis not present

## 2016-08-01 DIAGNOSIS — N186 End stage renal disease: Secondary | ICD-10-CM | POA: Diagnosis not present

## 2016-08-01 DIAGNOSIS — N2581 Secondary hyperparathyroidism of renal origin: Secondary | ICD-10-CM | POA: Diagnosis not present

## 2016-08-03 DIAGNOSIS — E875 Hyperkalemia: Secondary | ICD-10-CM | POA: Diagnosis not present

## 2016-08-03 DIAGNOSIS — N186 End stage renal disease: Secondary | ICD-10-CM | POA: Diagnosis not present

## 2016-08-03 DIAGNOSIS — E1129 Type 2 diabetes mellitus with other diabetic kidney complication: Secondary | ICD-10-CM | POA: Diagnosis not present

## 2016-08-03 DIAGNOSIS — D631 Anemia in chronic kidney disease: Secondary | ICD-10-CM | POA: Diagnosis not present

## 2016-08-03 DIAGNOSIS — N2581 Secondary hyperparathyroidism of renal origin: Secondary | ICD-10-CM | POA: Diagnosis not present

## 2016-08-06 DIAGNOSIS — N186 End stage renal disease: Secondary | ICD-10-CM | POA: Diagnosis not present

## 2016-08-06 DIAGNOSIS — D631 Anemia in chronic kidney disease: Secondary | ICD-10-CM | POA: Diagnosis not present

## 2016-08-06 DIAGNOSIS — N2581 Secondary hyperparathyroidism of renal origin: Secondary | ICD-10-CM | POA: Diagnosis not present

## 2016-08-06 DIAGNOSIS — E875 Hyperkalemia: Secondary | ICD-10-CM | POA: Diagnosis not present

## 2016-08-06 DIAGNOSIS — E1129 Type 2 diabetes mellitus with other diabetic kidney complication: Secondary | ICD-10-CM | POA: Diagnosis not present

## 2016-08-08 DIAGNOSIS — N2581 Secondary hyperparathyroidism of renal origin: Secondary | ICD-10-CM | POA: Diagnosis not present

## 2016-08-08 DIAGNOSIS — D631 Anemia in chronic kidney disease: Secondary | ICD-10-CM | POA: Diagnosis not present

## 2016-08-08 DIAGNOSIS — E1129 Type 2 diabetes mellitus with other diabetic kidney complication: Secondary | ICD-10-CM | POA: Diagnosis not present

## 2016-08-08 DIAGNOSIS — E875 Hyperkalemia: Secondary | ICD-10-CM | POA: Diagnosis not present

## 2016-08-08 DIAGNOSIS — N186 End stage renal disease: Secondary | ICD-10-CM | POA: Diagnosis not present

## 2016-08-10 DIAGNOSIS — E875 Hyperkalemia: Secondary | ICD-10-CM | POA: Diagnosis not present

## 2016-08-10 DIAGNOSIS — N2581 Secondary hyperparathyroidism of renal origin: Secondary | ICD-10-CM | POA: Diagnosis not present

## 2016-08-10 DIAGNOSIS — D631 Anemia in chronic kidney disease: Secondary | ICD-10-CM | POA: Diagnosis not present

## 2016-08-10 DIAGNOSIS — N186 End stage renal disease: Secondary | ICD-10-CM | POA: Diagnosis not present

## 2016-08-10 DIAGNOSIS — E1129 Type 2 diabetes mellitus with other diabetic kidney complication: Secondary | ICD-10-CM | POA: Diagnosis not present

## 2016-08-13 DIAGNOSIS — E875 Hyperkalemia: Secondary | ICD-10-CM | POA: Diagnosis not present

## 2016-08-13 DIAGNOSIS — D631 Anemia in chronic kidney disease: Secondary | ICD-10-CM | POA: Diagnosis not present

## 2016-08-13 DIAGNOSIS — N2581 Secondary hyperparathyroidism of renal origin: Secondary | ICD-10-CM | POA: Diagnosis not present

## 2016-08-13 DIAGNOSIS — E1129 Type 2 diabetes mellitus with other diabetic kidney complication: Secondary | ICD-10-CM | POA: Diagnosis not present

## 2016-08-13 DIAGNOSIS — N186 End stage renal disease: Secondary | ICD-10-CM | POA: Diagnosis not present

## 2016-08-15 DIAGNOSIS — N2581 Secondary hyperparathyroidism of renal origin: Secondary | ICD-10-CM | POA: Diagnosis not present

## 2016-08-15 DIAGNOSIS — E875 Hyperkalemia: Secondary | ICD-10-CM | POA: Diagnosis not present

## 2016-08-15 DIAGNOSIS — D631 Anemia in chronic kidney disease: Secondary | ICD-10-CM | POA: Diagnosis not present

## 2016-08-15 DIAGNOSIS — N186 End stage renal disease: Secondary | ICD-10-CM | POA: Diagnosis not present

## 2016-08-15 DIAGNOSIS — E1129 Type 2 diabetes mellitus with other diabetic kidney complication: Secondary | ICD-10-CM | POA: Diagnosis not present

## 2016-08-17 DIAGNOSIS — D631 Anemia in chronic kidney disease: Secondary | ICD-10-CM | POA: Diagnosis not present

## 2016-08-17 DIAGNOSIS — E875 Hyperkalemia: Secondary | ICD-10-CM | POA: Diagnosis not present

## 2016-08-17 DIAGNOSIS — N186 End stage renal disease: Secondary | ICD-10-CM | POA: Diagnosis not present

## 2016-08-17 DIAGNOSIS — E1129 Type 2 diabetes mellitus with other diabetic kidney complication: Secondary | ICD-10-CM | POA: Diagnosis not present

## 2016-08-17 DIAGNOSIS — N2581 Secondary hyperparathyroidism of renal origin: Secondary | ICD-10-CM | POA: Diagnosis not present

## 2016-08-22 DIAGNOSIS — D631 Anemia in chronic kidney disease: Secondary | ICD-10-CM | POA: Diagnosis not present

## 2016-08-22 DIAGNOSIS — N2581 Secondary hyperparathyroidism of renal origin: Secondary | ICD-10-CM | POA: Diagnosis not present

## 2016-08-22 DIAGNOSIS — E875 Hyperkalemia: Secondary | ICD-10-CM | POA: Diagnosis not present

## 2016-08-22 DIAGNOSIS — E1129 Type 2 diabetes mellitus with other diabetic kidney complication: Secondary | ICD-10-CM | POA: Diagnosis not present

## 2016-08-22 DIAGNOSIS — N186 End stage renal disease: Secondary | ICD-10-CM | POA: Diagnosis not present

## 2016-08-24 DIAGNOSIS — N2581 Secondary hyperparathyroidism of renal origin: Secondary | ICD-10-CM | POA: Diagnosis not present

## 2016-08-24 DIAGNOSIS — E875 Hyperkalemia: Secondary | ICD-10-CM | POA: Diagnosis not present

## 2016-08-24 DIAGNOSIS — N186 End stage renal disease: Secondary | ICD-10-CM | POA: Diagnosis not present

## 2016-08-24 DIAGNOSIS — D631 Anemia in chronic kidney disease: Secondary | ICD-10-CM | POA: Diagnosis not present

## 2016-08-24 DIAGNOSIS — E1129 Type 2 diabetes mellitus with other diabetic kidney complication: Secondary | ICD-10-CM | POA: Diagnosis not present

## 2016-08-27 DIAGNOSIS — E1129 Type 2 diabetes mellitus with other diabetic kidney complication: Secondary | ICD-10-CM | POA: Diagnosis not present

## 2016-08-27 DIAGNOSIS — N2581 Secondary hyperparathyroidism of renal origin: Secondary | ICD-10-CM | POA: Diagnosis not present

## 2016-08-27 DIAGNOSIS — E875 Hyperkalemia: Secondary | ICD-10-CM | POA: Diagnosis not present

## 2016-08-27 DIAGNOSIS — D631 Anemia in chronic kidney disease: Secondary | ICD-10-CM | POA: Diagnosis not present

## 2016-08-27 DIAGNOSIS — N186 End stage renal disease: Secondary | ICD-10-CM | POA: Diagnosis not present

## 2016-08-29 DIAGNOSIS — N2581 Secondary hyperparathyroidism of renal origin: Secondary | ICD-10-CM | POA: Diagnosis not present

## 2016-08-29 DIAGNOSIS — D631 Anemia in chronic kidney disease: Secondary | ICD-10-CM | POA: Diagnosis not present

## 2016-08-29 DIAGNOSIS — E875 Hyperkalemia: Secondary | ICD-10-CM | POA: Diagnosis not present

## 2016-08-29 DIAGNOSIS — E1129 Type 2 diabetes mellitus with other diabetic kidney complication: Secondary | ICD-10-CM | POA: Diagnosis not present

## 2016-08-29 DIAGNOSIS — N186 End stage renal disease: Secondary | ICD-10-CM | POA: Diagnosis not present

## 2016-08-30 DIAGNOSIS — Z992 Dependence on renal dialysis: Secondary | ICD-10-CM | POA: Diagnosis not present

## 2016-08-30 DIAGNOSIS — N186 End stage renal disease: Secondary | ICD-10-CM | POA: Diagnosis not present

## 2016-08-30 DIAGNOSIS — I120 Hypertensive chronic kidney disease with stage 5 chronic kidney disease or end stage renal disease: Secondary | ICD-10-CM | POA: Diagnosis not present

## 2016-08-31 DIAGNOSIS — D631 Anemia in chronic kidney disease: Secondary | ICD-10-CM | POA: Diagnosis not present

## 2016-08-31 DIAGNOSIS — N186 End stage renal disease: Secondary | ICD-10-CM | POA: Diagnosis not present

## 2016-08-31 DIAGNOSIS — E1129 Type 2 diabetes mellitus with other diabetic kidney complication: Secondary | ICD-10-CM | POA: Diagnosis not present

## 2016-08-31 DIAGNOSIS — Z23 Encounter for immunization: Secondary | ICD-10-CM | POA: Diagnosis not present

## 2016-08-31 DIAGNOSIS — N2581 Secondary hyperparathyroidism of renal origin: Secondary | ICD-10-CM | POA: Diagnosis not present

## 2016-09-03 DIAGNOSIS — D631 Anemia in chronic kidney disease: Secondary | ICD-10-CM | POA: Diagnosis not present

## 2016-09-03 DIAGNOSIS — Z23 Encounter for immunization: Secondary | ICD-10-CM | POA: Diagnosis not present

## 2016-09-03 DIAGNOSIS — N2581 Secondary hyperparathyroidism of renal origin: Secondary | ICD-10-CM | POA: Diagnosis not present

## 2016-09-03 DIAGNOSIS — N186 End stage renal disease: Secondary | ICD-10-CM | POA: Diagnosis not present

## 2016-09-03 DIAGNOSIS — E1129 Type 2 diabetes mellitus with other diabetic kidney complication: Secondary | ICD-10-CM | POA: Diagnosis not present

## 2016-09-05 DIAGNOSIS — Z23 Encounter for immunization: Secondary | ICD-10-CM | POA: Diagnosis not present

## 2016-09-05 DIAGNOSIS — E1129 Type 2 diabetes mellitus with other diabetic kidney complication: Secondary | ICD-10-CM | POA: Diagnosis not present

## 2016-09-05 DIAGNOSIS — N186 End stage renal disease: Secondary | ICD-10-CM | POA: Diagnosis not present

## 2016-09-05 DIAGNOSIS — D631 Anemia in chronic kidney disease: Secondary | ICD-10-CM | POA: Diagnosis not present

## 2016-09-05 DIAGNOSIS — N2581 Secondary hyperparathyroidism of renal origin: Secondary | ICD-10-CM | POA: Diagnosis not present

## 2016-09-08 DIAGNOSIS — N186 End stage renal disease: Secondary | ICD-10-CM | POA: Diagnosis not present

## 2016-09-08 DIAGNOSIS — Z23 Encounter for immunization: Secondary | ICD-10-CM | POA: Diagnosis not present

## 2016-09-08 DIAGNOSIS — E1129 Type 2 diabetes mellitus with other diabetic kidney complication: Secondary | ICD-10-CM | POA: Diagnosis not present

## 2016-09-08 DIAGNOSIS — N2581 Secondary hyperparathyroidism of renal origin: Secondary | ICD-10-CM | POA: Diagnosis not present

## 2016-09-08 DIAGNOSIS — D631 Anemia in chronic kidney disease: Secondary | ICD-10-CM | POA: Diagnosis not present

## 2016-09-10 DIAGNOSIS — D631 Anemia in chronic kidney disease: Secondary | ICD-10-CM | POA: Diagnosis not present

## 2016-09-10 DIAGNOSIS — N2581 Secondary hyperparathyroidism of renal origin: Secondary | ICD-10-CM | POA: Diagnosis not present

## 2016-09-10 DIAGNOSIS — N186 End stage renal disease: Secondary | ICD-10-CM | POA: Diagnosis not present

## 2016-09-10 DIAGNOSIS — E1129 Type 2 diabetes mellitus with other diabetic kidney complication: Secondary | ICD-10-CM | POA: Diagnosis not present

## 2016-09-10 DIAGNOSIS — Z23 Encounter for immunization: Secondary | ICD-10-CM | POA: Diagnosis not present

## 2016-09-12 DIAGNOSIS — N186 End stage renal disease: Secondary | ICD-10-CM | POA: Diagnosis not present

## 2016-09-12 DIAGNOSIS — D631 Anemia in chronic kidney disease: Secondary | ICD-10-CM | POA: Diagnosis not present

## 2016-09-12 DIAGNOSIS — Z23 Encounter for immunization: Secondary | ICD-10-CM | POA: Diagnosis not present

## 2016-09-12 DIAGNOSIS — N2581 Secondary hyperparathyroidism of renal origin: Secondary | ICD-10-CM | POA: Diagnosis not present

## 2016-09-12 DIAGNOSIS — E1129 Type 2 diabetes mellitus with other diabetic kidney complication: Secondary | ICD-10-CM | POA: Diagnosis not present

## 2016-09-14 DIAGNOSIS — E1129 Type 2 diabetes mellitus with other diabetic kidney complication: Secondary | ICD-10-CM | POA: Diagnosis not present

## 2016-09-14 DIAGNOSIS — N186 End stage renal disease: Secondary | ICD-10-CM | POA: Diagnosis not present

## 2016-09-14 DIAGNOSIS — Z23 Encounter for immunization: Secondary | ICD-10-CM | POA: Diagnosis not present

## 2016-09-14 DIAGNOSIS — D631 Anemia in chronic kidney disease: Secondary | ICD-10-CM | POA: Diagnosis not present

## 2016-09-14 DIAGNOSIS — N2581 Secondary hyperparathyroidism of renal origin: Secondary | ICD-10-CM | POA: Diagnosis not present

## 2016-09-17 DIAGNOSIS — Z23 Encounter for immunization: Secondary | ICD-10-CM | POA: Diagnosis not present

## 2016-09-17 DIAGNOSIS — D631 Anemia in chronic kidney disease: Secondary | ICD-10-CM | POA: Diagnosis not present

## 2016-09-17 DIAGNOSIS — E1129 Type 2 diabetes mellitus with other diabetic kidney complication: Secondary | ICD-10-CM | POA: Diagnosis not present

## 2016-09-17 DIAGNOSIS — N2581 Secondary hyperparathyroidism of renal origin: Secondary | ICD-10-CM | POA: Diagnosis not present

## 2016-09-17 DIAGNOSIS — N186 End stage renal disease: Secondary | ICD-10-CM | POA: Diagnosis not present

## 2016-09-19 DIAGNOSIS — E1129 Type 2 diabetes mellitus with other diabetic kidney complication: Secondary | ICD-10-CM | POA: Diagnosis not present

## 2016-09-19 DIAGNOSIS — N2581 Secondary hyperparathyroidism of renal origin: Secondary | ICD-10-CM | POA: Diagnosis not present

## 2016-09-19 DIAGNOSIS — D631 Anemia in chronic kidney disease: Secondary | ICD-10-CM | POA: Diagnosis not present

## 2016-09-19 DIAGNOSIS — Z23 Encounter for immunization: Secondary | ICD-10-CM | POA: Diagnosis not present

## 2016-09-19 DIAGNOSIS — N186 End stage renal disease: Secondary | ICD-10-CM | POA: Diagnosis not present

## 2016-09-21 DIAGNOSIS — N186 End stage renal disease: Secondary | ICD-10-CM | POA: Diagnosis not present

## 2016-09-21 DIAGNOSIS — E1129 Type 2 diabetes mellitus with other diabetic kidney complication: Secondary | ICD-10-CM | POA: Diagnosis not present

## 2016-09-21 DIAGNOSIS — D631 Anemia in chronic kidney disease: Secondary | ICD-10-CM | POA: Diagnosis not present

## 2016-09-21 DIAGNOSIS — Z23 Encounter for immunization: Secondary | ICD-10-CM | POA: Diagnosis not present

## 2016-09-21 DIAGNOSIS — N2581 Secondary hyperparathyroidism of renal origin: Secondary | ICD-10-CM | POA: Diagnosis not present

## 2016-09-24 ENCOUNTER — Emergency Department (HOSPITAL_COMMUNITY)
Admission: EM | Admit: 2016-09-24 | Discharge: 2016-09-24 | Disposition: A | Discharge status: Home / Self Care | Payer: Medicare Other | Attending: Emergency Medicine | Admitting: Emergency Medicine

## 2016-09-24 ENCOUNTER — Encounter (HOSPITAL_COMMUNITY): Payer: Self-pay

## 2016-09-24 DIAGNOSIS — N186 End stage renal disease: Secondary | ICD-10-CM | POA: Diagnosis not present

## 2016-09-24 DIAGNOSIS — Y939 Activity, unspecified: Secondary | ICD-10-CM | POA: Diagnosis not present

## 2016-09-24 DIAGNOSIS — Z87891 Personal history of nicotine dependence: Secondary | ICD-10-CM | POA: Diagnosis not present

## 2016-09-24 DIAGNOSIS — Z7982 Long term (current) use of aspirin: Secondary | ICD-10-CM | POA: Insufficient documentation

## 2016-09-24 DIAGNOSIS — S61216A Laceration without foreign body of right little finger without damage to nail, initial encounter: Secondary | ICD-10-CM | POA: Diagnosis not present

## 2016-09-24 DIAGNOSIS — S91114A Laceration without foreign body of right lesser toe(s) without damage to nail, initial encounter: Secondary | ICD-10-CM | POA: Insufficient documentation

## 2016-09-24 DIAGNOSIS — Z992 Dependence on renal dialysis: Secondary | ICD-10-CM | POA: Diagnosis not present

## 2016-09-24 DIAGNOSIS — Y999 Unspecified external cause status: Secondary | ICD-10-CM | POA: Diagnosis not present

## 2016-09-24 DIAGNOSIS — Z23 Encounter for immunization: Secondary | ICD-10-CM | POA: Diagnosis not present

## 2016-09-24 DIAGNOSIS — E1122 Type 2 diabetes mellitus with diabetic chronic kidney disease: Secondary | ICD-10-CM | POA: Insufficient documentation

## 2016-09-24 DIAGNOSIS — I120 Hypertensive chronic kidney disease with stage 5 chronic kidney disease or end stage renal disease: Secondary | ICD-10-CM | POA: Diagnosis not present

## 2016-09-24 DIAGNOSIS — J449 Chronic obstructive pulmonary disease, unspecified: Secondary | ICD-10-CM | POA: Insufficient documentation

## 2016-09-24 DIAGNOSIS — Y92002 Bathroom of unspecified non-institutional (private) residence single-family (private) house as the place of occurrence of the external cause: Secondary | ICD-10-CM | POA: Insufficient documentation

## 2016-09-24 DIAGNOSIS — W268XXA Contact with other sharp object(s), not elsewhere classified, initial encounter: Secondary | ICD-10-CM | POA: Insufficient documentation

## 2016-09-24 DIAGNOSIS — IMO0002 Reserved for concepts with insufficient information to code with codable children: Secondary | ICD-10-CM

## 2016-09-24 MED ORDER — ACETAMINOPHEN 325 MG PO TABS
650.0000 mg | ORAL_TABLET | Freq: Once | ORAL | Status: AC
  Administered 2016-09-24: 650 mg via ORAL
  Filled 2016-09-24: qty 2

## 2016-09-24 MED ORDER — TETANUS-DIPHTH-ACELL PERTUSSIS 5-2.5-18.5 LF-MCG/0.5 IM SUSP
0.5000 mL | Freq: Once | INTRAMUSCULAR | Status: AC
  Administered 2016-09-24: 0.5 mL via INTRAMUSCULAR
  Filled 2016-09-24: qty 0.5
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
## 2016-09-24 NOTE — ED Notes (Signed)
MD at bedside. 

## 2016-09-24 NOTE — Discharge Instructions (Signed)
Keep the dressing in place until tomorrow. Remove dressing, clean the wound daily with soap and water. Apply antibiotic ointment daily

## 2016-09-24 NOTE — ED Notes (Signed)
Pt taken out via wheelchair appears in no distress able to put weight on R foot

## 2016-09-24 NOTE — ED Triage Notes (Signed)
Pt went to stand on the scale this morning when she got a small laceration noted to the R pinky toe area

## 2016-09-25 DIAGNOSIS — E1129 Type 2 diabetes mellitus with other diabetic kidney complication: Secondary | ICD-10-CM | POA: Diagnosis not present

## 2016-09-25 DIAGNOSIS — N2581 Secondary hyperparathyroidism of renal origin: Secondary | ICD-10-CM | POA: Diagnosis not present

## 2016-09-25 DIAGNOSIS — D631 Anemia in chronic kidney disease: Secondary | ICD-10-CM | POA: Diagnosis not present

## 2016-09-25 DIAGNOSIS — Z23 Encounter for immunization: Secondary | ICD-10-CM | POA: Diagnosis not present

## 2016-09-25 DIAGNOSIS — N186 End stage renal disease: Secondary | ICD-10-CM | POA: Diagnosis not present

## 2016-09-26 DIAGNOSIS — N186 End stage renal disease: Secondary | ICD-10-CM | POA: Diagnosis not present

## 2016-09-26 DIAGNOSIS — Z23 Encounter for immunization: Secondary | ICD-10-CM | POA: Diagnosis not present

## 2016-09-26 DIAGNOSIS — N2581 Secondary hyperparathyroidism of renal origin: Secondary | ICD-10-CM | POA: Diagnosis not present

## 2016-09-26 DIAGNOSIS — D631 Anemia in chronic kidney disease: Secondary | ICD-10-CM | POA: Diagnosis not present

## 2016-09-26 DIAGNOSIS — E1129 Type 2 diabetes mellitus with other diabetic kidney complication: Secondary | ICD-10-CM | POA: Diagnosis not present

## 2016-09-28 DIAGNOSIS — N2581 Secondary hyperparathyroidism of renal origin: Secondary | ICD-10-CM | POA: Diagnosis not present

## 2016-09-28 DIAGNOSIS — N186 End stage renal disease: Secondary | ICD-10-CM | POA: Diagnosis not present

## 2016-09-28 DIAGNOSIS — D631 Anemia in chronic kidney disease: Secondary | ICD-10-CM | POA: Diagnosis not present

## 2016-09-28 DIAGNOSIS — Z23 Encounter for immunization: Secondary | ICD-10-CM | POA: Diagnosis not present

## 2016-09-28 DIAGNOSIS — E1129 Type 2 diabetes mellitus with other diabetic kidney complication: Secondary | ICD-10-CM | POA: Diagnosis not present

## 2016-09-29 DIAGNOSIS — N186 End stage renal disease: Secondary | ICD-10-CM | POA: Diagnosis not present

## 2016-09-29 DIAGNOSIS — I120 Hypertensive chronic kidney disease with stage 5 chronic kidney disease or end stage renal disease: Secondary | ICD-10-CM | POA: Diagnosis not present

## 2016-09-29 DIAGNOSIS — Z992 Dependence on renal dialysis: Secondary | ICD-10-CM | POA: Diagnosis not present

## 2016-10-01 DIAGNOSIS — N186 End stage renal disease: Secondary | ICD-10-CM | POA: Diagnosis not present

## 2016-10-01 DIAGNOSIS — N2581 Secondary hyperparathyroidism of renal origin: Secondary | ICD-10-CM | POA: Diagnosis not present

## 2016-10-01 DIAGNOSIS — D631 Anemia in chronic kidney disease: Secondary | ICD-10-CM | POA: Diagnosis not present

## 2016-10-03 DIAGNOSIS — N186 End stage renal disease: Secondary | ICD-10-CM | POA: Diagnosis not present

## 2016-10-03 DIAGNOSIS — N2581 Secondary hyperparathyroidism of renal origin: Secondary | ICD-10-CM | POA: Diagnosis not present

## 2016-10-03 DIAGNOSIS — D631 Anemia in chronic kidney disease: Secondary | ICD-10-CM | POA: Diagnosis not present

## 2016-10-05 DIAGNOSIS — N186 End stage renal disease: Secondary | ICD-10-CM | POA: Diagnosis not present

## 2016-10-05 DIAGNOSIS — D631 Anemia in chronic kidney disease: Secondary | ICD-10-CM | POA: Diagnosis not present

## 2016-10-05 DIAGNOSIS — N2581 Secondary hyperparathyroidism of renal origin: Secondary | ICD-10-CM | POA: Diagnosis not present

## 2016-10-08 DIAGNOSIS — N186 End stage renal disease: Secondary | ICD-10-CM | POA: Diagnosis not present

## 2016-10-08 DIAGNOSIS — D631 Anemia in chronic kidney disease: Secondary | ICD-10-CM | POA: Diagnosis not present

## 2016-10-08 DIAGNOSIS — N2581 Secondary hyperparathyroidism of renal origin: Secondary | ICD-10-CM | POA: Diagnosis not present

## 2016-10-12 DIAGNOSIS — N2581 Secondary hyperparathyroidism of renal origin: Secondary | ICD-10-CM | POA: Diagnosis not present

## 2016-10-12 DIAGNOSIS — D631 Anemia in chronic kidney disease: Secondary | ICD-10-CM | POA: Diagnosis not present

## 2016-10-12 DIAGNOSIS — N186 End stage renal disease: Secondary | ICD-10-CM | POA: Diagnosis not present

## 2016-10-15 DIAGNOSIS — N186 End stage renal disease: Secondary | ICD-10-CM | POA: Diagnosis not present

## 2016-10-15 DIAGNOSIS — D631 Anemia in chronic kidney disease: Secondary | ICD-10-CM | POA: Diagnosis not present

## 2016-10-15 DIAGNOSIS — N2581 Secondary hyperparathyroidism of renal origin: Secondary | ICD-10-CM | POA: Diagnosis not present

## 2016-10-17 DIAGNOSIS — N2581 Secondary hyperparathyroidism of renal origin: Secondary | ICD-10-CM | POA: Diagnosis not present

## 2016-10-17 DIAGNOSIS — N186 End stage renal disease: Secondary | ICD-10-CM | POA: Diagnosis not present

## 2016-10-17 DIAGNOSIS — D631 Anemia in chronic kidney disease: Secondary | ICD-10-CM | POA: Diagnosis not present

## 2016-10-19 DIAGNOSIS — N2581 Secondary hyperparathyroidism of renal origin: Secondary | ICD-10-CM | POA: Diagnosis not present

## 2016-10-19 DIAGNOSIS — N186 End stage renal disease: Secondary | ICD-10-CM | POA: Diagnosis not present

## 2016-10-19 DIAGNOSIS — D631 Anemia in chronic kidney disease: Secondary | ICD-10-CM | POA: Diagnosis not present

## 2016-10-22 DIAGNOSIS — N186 End stage renal disease: Secondary | ICD-10-CM | POA: Diagnosis not present

## 2016-10-22 DIAGNOSIS — D631 Anemia in chronic kidney disease: Secondary | ICD-10-CM | POA: Diagnosis not present

## 2016-10-22 DIAGNOSIS — N2581 Secondary hyperparathyroidism of renal origin: Secondary | ICD-10-CM | POA: Diagnosis not present

## 2016-10-24 DIAGNOSIS — D631 Anemia in chronic kidney disease: Secondary | ICD-10-CM | POA: Diagnosis not present

## 2016-10-24 DIAGNOSIS — N186 End stage renal disease: Secondary | ICD-10-CM | POA: Diagnosis not present

## 2016-10-24 DIAGNOSIS — N2581 Secondary hyperparathyroidism of renal origin: Secondary | ICD-10-CM | POA: Diagnosis not present

## 2016-10-24 DIAGNOSIS — E1129 Type 2 diabetes mellitus with other diabetic kidney complication: Secondary | ICD-10-CM | POA: Diagnosis not present

## 2016-10-26 DIAGNOSIS — N186 End stage renal disease: Secondary | ICD-10-CM | POA: Diagnosis not present

## 2016-10-26 DIAGNOSIS — N2581 Secondary hyperparathyroidism of renal origin: Secondary | ICD-10-CM | POA: Diagnosis not present

## 2016-10-26 DIAGNOSIS — D631 Anemia in chronic kidney disease: Secondary | ICD-10-CM | POA: Diagnosis not present

## 2016-10-29 DIAGNOSIS — N2581 Secondary hyperparathyroidism of renal origin: Secondary | ICD-10-CM | POA: Diagnosis not present

## 2016-10-29 DIAGNOSIS — D631 Anemia in chronic kidney disease: Secondary | ICD-10-CM | POA: Diagnosis not present

## 2016-10-29 DIAGNOSIS — N186 End stage renal disease: Secondary | ICD-10-CM | POA: Diagnosis not present

## 2016-10-30 DIAGNOSIS — N186 End stage renal disease: Secondary | ICD-10-CM | POA: Diagnosis not present

## 2016-10-30 DIAGNOSIS — I120 Hypertensive chronic kidney disease with stage 5 chronic kidney disease or end stage renal disease: Secondary | ICD-10-CM | POA: Diagnosis not present

## 2016-10-30 DIAGNOSIS — Z992 Dependence on renal dialysis: Secondary | ICD-10-CM | POA: Diagnosis not present

## 2016-10-31 DIAGNOSIS — D509 Iron deficiency anemia, unspecified: Secondary | ICD-10-CM | POA: Diagnosis not present

## 2016-10-31 DIAGNOSIS — E1129 Type 2 diabetes mellitus with other diabetic kidney complication: Secondary | ICD-10-CM | POA: Diagnosis not present

## 2016-10-31 DIAGNOSIS — N2581 Secondary hyperparathyroidism of renal origin: Secondary | ICD-10-CM | POA: Diagnosis not present

## 2016-10-31 DIAGNOSIS — N186 End stage renal disease: Secondary | ICD-10-CM | POA: Diagnosis not present

## 2016-10-31 DIAGNOSIS — D631 Anemia in chronic kidney disease: Secondary | ICD-10-CM | POA: Diagnosis not present

## 2016-11-02 DIAGNOSIS — E1129 Type 2 diabetes mellitus with other diabetic kidney complication: Secondary | ICD-10-CM | POA: Diagnosis not present

## 2016-11-02 DIAGNOSIS — N2581 Secondary hyperparathyroidism of renal origin: Secondary | ICD-10-CM | POA: Diagnosis not present

## 2016-11-02 DIAGNOSIS — D631 Anemia in chronic kidney disease: Secondary | ICD-10-CM | POA: Diagnosis not present

## 2016-11-02 DIAGNOSIS — D509 Iron deficiency anemia, unspecified: Secondary | ICD-10-CM | POA: Diagnosis not present

## 2016-11-02 DIAGNOSIS — N186 End stage renal disease: Secondary | ICD-10-CM | POA: Diagnosis not present

## 2016-11-05 DIAGNOSIS — N186 End stage renal disease: Secondary | ICD-10-CM | POA: Diagnosis not present

## 2016-11-05 DIAGNOSIS — E1129 Type 2 diabetes mellitus with other diabetic kidney complication: Secondary | ICD-10-CM | POA: Diagnosis not present

## 2016-11-05 DIAGNOSIS — N2581 Secondary hyperparathyroidism of renal origin: Secondary | ICD-10-CM | POA: Diagnosis not present

## 2016-11-05 DIAGNOSIS — D631 Anemia in chronic kidney disease: Secondary | ICD-10-CM | POA: Diagnosis not present

## 2016-11-05 DIAGNOSIS — D509 Iron deficiency anemia, unspecified: Secondary | ICD-10-CM | POA: Diagnosis not present

## 2016-11-07 DIAGNOSIS — E1129 Type 2 diabetes mellitus with other diabetic kidney complication: Secondary | ICD-10-CM | POA: Diagnosis not present

## 2016-11-07 DIAGNOSIS — N186 End stage renal disease: Secondary | ICD-10-CM | POA: Diagnosis not present

## 2016-11-07 DIAGNOSIS — N2581 Secondary hyperparathyroidism of renal origin: Secondary | ICD-10-CM | POA: Diagnosis not present

## 2016-11-07 DIAGNOSIS — D509 Iron deficiency anemia, unspecified: Secondary | ICD-10-CM | POA: Diagnosis not present

## 2016-11-07 DIAGNOSIS — D631 Anemia in chronic kidney disease: Secondary | ICD-10-CM | POA: Diagnosis not present

## 2016-11-09 DIAGNOSIS — D509 Iron deficiency anemia, unspecified: Secondary | ICD-10-CM | POA: Diagnosis not present

## 2016-11-09 DIAGNOSIS — E1129 Type 2 diabetes mellitus with other diabetic kidney complication: Secondary | ICD-10-CM | POA: Diagnosis not present

## 2016-11-09 DIAGNOSIS — D631 Anemia in chronic kidney disease: Secondary | ICD-10-CM | POA: Diagnosis not present

## 2016-11-09 DIAGNOSIS — N186 End stage renal disease: Secondary | ICD-10-CM | POA: Diagnosis not present

## 2016-11-09 DIAGNOSIS — N2581 Secondary hyperparathyroidism of renal origin: Secondary | ICD-10-CM | POA: Diagnosis not present

## 2016-11-12 DIAGNOSIS — D631 Anemia in chronic kidney disease: Secondary | ICD-10-CM | POA: Diagnosis not present

## 2016-11-12 DIAGNOSIS — E1129 Type 2 diabetes mellitus with other diabetic kidney complication: Secondary | ICD-10-CM | POA: Diagnosis not present

## 2016-11-12 DIAGNOSIS — N2581 Secondary hyperparathyroidism of renal origin: Secondary | ICD-10-CM | POA: Diagnosis not present

## 2016-11-12 DIAGNOSIS — D509 Iron deficiency anemia, unspecified: Secondary | ICD-10-CM | POA: Diagnosis not present

## 2016-11-12 DIAGNOSIS — N186 End stage renal disease: Secondary | ICD-10-CM | POA: Diagnosis not present

## 2016-11-14 DIAGNOSIS — D509 Iron deficiency anemia, unspecified: Secondary | ICD-10-CM | POA: Diagnosis not present

## 2016-11-14 DIAGNOSIS — N186 End stage renal disease: Secondary | ICD-10-CM | POA: Diagnosis not present

## 2016-11-14 DIAGNOSIS — N2581 Secondary hyperparathyroidism of renal origin: Secondary | ICD-10-CM | POA: Diagnosis not present

## 2016-11-14 DIAGNOSIS — E1129 Type 2 diabetes mellitus with other diabetic kidney complication: Secondary | ICD-10-CM | POA: Diagnosis not present

## 2016-11-14 DIAGNOSIS — D631 Anemia in chronic kidney disease: Secondary | ICD-10-CM | POA: Diagnosis not present

## 2016-11-16 DIAGNOSIS — N2581 Secondary hyperparathyroidism of renal origin: Secondary | ICD-10-CM | POA: Diagnosis not present

## 2016-11-16 DIAGNOSIS — D509 Iron deficiency anemia, unspecified: Secondary | ICD-10-CM | POA: Diagnosis not present

## 2016-11-16 DIAGNOSIS — E1129 Type 2 diabetes mellitus with other diabetic kidney complication: Secondary | ICD-10-CM | POA: Diagnosis not present

## 2016-11-16 DIAGNOSIS — D631 Anemia in chronic kidney disease: Secondary | ICD-10-CM | POA: Diagnosis not present

## 2016-11-16 DIAGNOSIS — N186 End stage renal disease: Secondary | ICD-10-CM | POA: Diagnosis not present

## 2016-11-18 DIAGNOSIS — E1129 Type 2 diabetes mellitus with other diabetic kidney complication: Secondary | ICD-10-CM | POA: Diagnosis not present

## 2016-11-18 DIAGNOSIS — D509 Iron deficiency anemia, unspecified: Secondary | ICD-10-CM | POA: Diagnosis not present

## 2016-11-18 DIAGNOSIS — N2581 Secondary hyperparathyroidism of renal origin: Secondary | ICD-10-CM | POA: Diagnosis not present

## 2016-11-18 DIAGNOSIS — D631 Anemia in chronic kidney disease: Secondary | ICD-10-CM | POA: Diagnosis not present

## 2016-11-18 DIAGNOSIS — N186 End stage renal disease: Secondary | ICD-10-CM | POA: Diagnosis not present

## 2016-11-20 DIAGNOSIS — E1129 Type 2 diabetes mellitus with other diabetic kidney complication: Secondary | ICD-10-CM | POA: Diagnosis not present

## 2016-11-20 DIAGNOSIS — D631 Anemia in chronic kidney disease: Secondary | ICD-10-CM | POA: Diagnosis not present

## 2016-11-20 DIAGNOSIS — N2581 Secondary hyperparathyroidism of renal origin: Secondary | ICD-10-CM | POA: Diagnosis not present

## 2016-11-20 DIAGNOSIS — D509 Iron deficiency anemia, unspecified: Secondary | ICD-10-CM | POA: Diagnosis not present

## 2016-11-20 DIAGNOSIS — N186 End stage renal disease: Secondary | ICD-10-CM | POA: Diagnosis not present

## 2016-11-23 DIAGNOSIS — N186 End stage renal disease: Secondary | ICD-10-CM | POA: Diagnosis not present

## 2016-11-23 DIAGNOSIS — D509 Iron deficiency anemia, unspecified: Secondary | ICD-10-CM | POA: Diagnosis not present

## 2016-11-23 DIAGNOSIS — D631 Anemia in chronic kidney disease: Secondary | ICD-10-CM | POA: Diagnosis not present

## 2016-11-23 DIAGNOSIS — E1129 Type 2 diabetes mellitus with other diabetic kidney complication: Secondary | ICD-10-CM | POA: Diagnosis not present

## 2016-11-23 DIAGNOSIS — N2581 Secondary hyperparathyroidism of renal origin: Secondary | ICD-10-CM | POA: Diagnosis not present

## 2016-11-26 DIAGNOSIS — E1129 Type 2 diabetes mellitus with other diabetic kidney complication: Secondary | ICD-10-CM | POA: Diagnosis not present

## 2016-11-26 DIAGNOSIS — N186 End stage renal disease: Secondary | ICD-10-CM | POA: Diagnosis not present

## 2016-11-26 DIAGNOSIS — D509 Iron deficiency anemia, unspecified: Secondary | ICD-10-CM | POA: Diagnosis not present

## 2016-11-26 DIAGNOSIS — D631 Anemia in chronic kidney disease: Secondary | ICD-10-CM | POA: Diagnosis not present

## 2016-11-26 DIAGNOSIS — N2581 Secondary hyperparathyroidism of renal origin: Secondary | ICD-10-CM | POA: Diagnosis not present

## 2016-11-28 DIAGNOSIS — D631 Anemia in chronic kidney disease: Secondary | ICD-10-CM | POA: Diagnosis not present

## 2016-11-28 DIAGNOSIS — E1129 Type 2 diabetes mellitus with other diabetic kidney complication: Secondary | ICD-10-CM | POA: Diagnosis not present

## 2016-11-28 DIAGNOSIS — N186 End stage renal disease: Secondary | ICD-10-CM | POA: Diagnosis not present

## 2016-11-28 DIAGNOSIS — N2581 Secondary hyperparathyroidism of renal origin: Secondary | ICD-10-CM | POA: Diagnosis not present

## 2016-11-28 DIAGNOSIS — D509 Iron deficiency anemia, unspecified: Secondary | ICD-10-CM | POA: Diagnosis not present

## 2016-11-29 DIAGNOSIS — Z992 Dependence on renal dialysis: Secondary | ICD-10-CM | POA: Diagnosis not present

## 2016-11-29 DIAGNOSIS — N186 End stage renal disease: Secondary | ICD-10-CM | POA: Diagnosis not present

## 2016-11-29 DIAGNOSIS — I120 Hypertensive chronic kidney disease with stage 5 chronic kidney disease or end stage renal disease: Secondary | ICD-10-CM | POA: Diagnosis not present

## 2016-11-30 DIAGNOSIS — D689 Coagulation defect, unspecified: Secondary | ICD-10-CM | POA: Diagnosis not present

## 2016-11-30 DIAGNOSIS — E1129 Type 2 diabetes mellitus with other diabetic kidney complication: Secondary | ICD-10-CM | POA: Diagnosis not present

## 2016-11-30 DIAGNOSIS — N2581 Secondary hyperparathyroidism of renal origin: Secondary | ICD-10-CM | POA: Diagnosis not present

## 2016-11-30 DIAGNOSIS — D631 Anemia in chronic kidney disease: Secondary | ICD-10-CM | POA: Diagnosis not present

## 2016-11-30 DIAGNOSIS — N186 End stage renal disease: Secondary | ICD-10-CM | POA: Diagnosis not present

## 2016-12-03 DIAGNOSIS — N186 End stage renal disease: Secondary | ICD-10-CM | POA: Diagnosis not present

## 2016-12-03 DIAGNOSIS — D631 Anemia in chronic kidney disease: Secondary | ICD-10-CM | POA: Diagnosis not present

## 2016-12-03 DIAGNOSIS — E1129 Type 2 diabetes mellitus with other diabetic kidney complication: Secondary | ICD-10-CM | POA: Diagnosis not present

## 2016-12-03 DIAGNOSIS — D689 Coagulation defect, unspecified: Secondary | ICD-10-CM | POA: Diagnosis not present

## 2016-12-03 DIAGNOSIS — N2581 Secondary hyperparathyroidism of renal origin: Secondary | ICD-10-CM | POA: Diagnosis not present

## 2016-12-05 DIAGNOSIS — N2581 Secondary hyperparathyroidism of renal origin: Secondary | ICD-10-CM | POA: Diagnosis not present

## 2016-12-05 DIAGNOSIS — E1129 Type 2 diabetes mellitus with other diabetic kidney complication: Secondary | ICD-10-CM | POA: Diagnosis not present

## 2016-12-05 DIAGNOSIS — D631 Anemia in chronic kidney disease: Secondary | ICD-10-CM | POA: Diagnosis not present

## 2016-12-05 DIAGNOSIS — N186 End stage renal disease: Secondary | ICD-10-CM | POA: Diagnosis not present

## 2016-12-05 DIAGNOSIS — D689 Coagulation defect, unspecified: Secondary | ICD-10-CM | POA: Diagnosis not present

## 2016-12-07 DIAGNOSIS — N186 End stage renal disease: Secondary | ICD-10-CM | POA: Diagnosis not present

## 2016-12-07 DIAGNOSIS — E1129 Type 2 diabetes mellitus with other diabetic kidney complication: Secondary | ICD-10-CM | POA: Diagnosis not present

## 2016-12-07 DIAGNOSIS — D689 Coagulation defect, unspecified: Secondary | ICD-10-CM | POA: Diagnosis not present

## 2016-12-07 DIAGNOSIS — D631 Anemia in chronic kidney disease: Secondary | ICD-10-CM | POA: Diagnosis not present

## 2016-12-07 DIAGNOSIS — N2581 Secondary hyperparathyroidism of renal origin: Secondary | ICD-10-CM | POA: Diagnosis not present

## 2016-12-10 DIAGNOSIS — E1129 Type 2 diabetes mellitus with other diabetic kidney complication: Secondary | ICD-10-CM | POA: Diagnosis not present

## 2016-12-10 DIAGNOSIS — N186 End stage renal disease: Secondary | ICD-10-CM | POA: Diagnosis not present

## 2016-12-10 DIAGNOSIS — D631 Anemia in chronic kidney disease: Secondary | ICD-10-CM | POA: Diagnosis not present

## 2016-12-10 DIAGNOSIS — N2581 Secondary hyperparathyroidism of renal origin: Secondary | ICD-10-CM | POA: Diagnosis not present

## 2016-12-10 DIAGNOSIS — D689 Coagulation defect, unspecified: Secondary | ICD-10-CM | POA: Diagnosis not present

## 2016-12-12 DIAGNOSIS — D689 Coagulation defect, unspecified: Secondary | ICD-10-CM | POA: Diagnosis not present

## 2016-12-12 DIAGNOSIS — D631 Anemia in chronic kidney disease: Secondary | ICD-10-CM | POA: Diagnosis not present

## 2016-12-12 DIAGNOSIS — N2581 Secondary hyperparathyroidism of renal origin: Secondary | ICD-10-CM | POA: Diagnosis not present

## 2016-12-12 DIAGNOSIS — N186 End stage renal disease: Secondary | ICD-10-CM | POA: Diagnosis not present

## 2016-12-12 DIAGNOSIS — E1129 Type 2 diabetes mellitus with other diabetic kidney complication: Secondary | ICD-10-CM | POA: Diagnosis not present

## 2016-12-14 DIAGNOSIS — D631 Anemia in chronic kidney disease: Secondary | ICD-10-CM | POA: Diagnosis not present

## 2016-12-14 DIAGNOSIS — D689 Coagulation defect, unspecified: Secondary | ICD-10-CM | POA: Diagnosis not present

## 2016-12-14 DIAGNOSIS — N2581 Secondary hyperparathyroidism of renal origin: Secondary | ICD-10-CM | POA: Diagnosis not present

## 2016-12-14 DIAGNOSIS — E1129 Type 2 diabetes mellitus with other diabetic kidney complication: Secondary | ICD-10-CM | POA: Diagnosis not present

## 2016-12-14 DIAGNOSIS — N186 End stage renal disease: Secondary | ICD-10-CM | POA: Diagnosis not present

## 2016-12-17 ENCOUNTER — Other Ambulatory Visit: Payer: Self-pay | Admitting: Cardiology

## 2016-12-17 DIAGNOSIS — D689 Coagulation defect, unspecified: Secondary | ICD-10-CM | POA: Diagnosis not present

## 2016-12-17 DIAGNOSIS — N186 End stage renal disease: Secondary | ICD-10-CM | POA: Diagnosis not present

## 2016-12-17 DIAGNOSIS — D631 Anemia in chronic kidney disease: Secondary | ICD-10-CM | POA: Diagnosis not present

## 2016-12-17 DIAGNOSIS — N2581 Secondary hyperparathyroidism of renal origin: Secondary | ICD-10-CM | POA: Diagnosis not present

## 2016-12-17 DIAGNOSIS — E1129 Type 2 diabetes mellitus with other diabetic kidney complication: Secondary | ICD-10-CM | POA: Diagnosis not present

## 2016-12-18 DIAGNOSIS — M25561 Pain in right knee: Secondary | ICD-10-CM | POA: Diagnosis not present

## 2016-12-18 DIAGNOSIS — M25562 Pain in left knee: Secondary | ICD-10-CM | POA: Diagnosis not present

## 2016-12-18 DIAGNOSIS — M170 Bilateral primary osteoarthritis of knee: Secondary | ICD-10-CM | POA: Diagnosis not present

## 2016-12-19 DIAGNOSIS — D689 Coagulation defect, unspecified: Secondary | ICD-10-CM | POA: Diagnosis not present

## 2016-12-19 DIAGNOSIS — N2581 Secondary hyperparathyroidism of renal origin: Secondary | ICD-10-CM | POA: Diagnosis not present

## 2016-12-19 DIAGNOSIS — E1129 Type 2 diabetes mellitus with other diabetic kidney complication: Secondary | ICD-10-CM | POA: Diagnosis not present

## 2016-12-19 DIAGNOSIS — N186 End stage renal disease: Secondary | ICD-10-CM | POA: Diagnosis not present

## 2016-12-19 DIAGNOSIS — D631 Anemia in chronic kidney disease: Secondary | ICD-10-CM | POA: Diagnosis not present

## 2016-12-21 DIAGNOSIS — D631 Anemia in chronic kidney disease: Secondary | ICD-10-CM | POA: Diagnosis not present

## 2016-12-21 DIAGNOSIS — N2581 Secondary hyperparathyroidism of renal origin: Secondary | ICD-10-CM | POA: Diagnosis not present

## 2016-12-21 DIAGNOSIS — D689 Coagulation defect, unspecified: Secondary | ICD-10-CM | POA: Diagnosis not present

## 2016-12-21 DIAGNOSIS — E1129 Type 2 diabetes mellitus with other diabetic kidney complication: Secondary | ICD-10-CM | POA: Diagnosis not present

## 2016-12-21 DIAGNOSIS — N186 End stage renal disease: Secondary | ICD-10-CM | POA: Diagnosis not present

## 2016-12-23 DIAGNOSIS — D631 Anemia in chronic kidney disease: Secondary | ICD-10-CM | POA: Diagnosis not present

## 2016-12-23 DIAGNOSIS — E1129 Type 2 diabetes mellitus with other diabetic kidney complication: Secondary | ICD-10-CM | POA: Diagnosis not present

## 2016-12-23 DIAGNOSIS — D689 Coagulation defect, unspecified: Secondary | ICD-10-CM | POA: Diagnosis not present

## 2016-12-23 DIAGNOSIS — N186 End stage renal disease: Secondary | ICD-10-CM | POA: Diagnosis not present

## 2016-12-23 DIAGNOSIS — N2581 Secondary hyperparathyroidism of renal origin: Secondary | ICD-10-CM | POA: Diagnosis not present

## 2016-12-26 DIAGNOSIS — D631 Anemia in chronic kidney disease: Secondary | ICD-10-CM | POA: Diagnosis not present

## 2016-12-26 DIAGNOSIS — E1129 Type 2 diabetes mellitus with other diabetic kidney complication: Secondary | ICD-10-CM | POA: Diagnosis not present

## 2016-12-26 DIAGNOSIS — N2581 Secondary hyperparathyroidism of renal origin: Secondary | ICD-10-CM | POA: Diagnosis not present

## 2016-12-26 DIAGNOSIS — D689 Coagulation defect, unspecified: Secondary | ICD-10-CM | POA: Diagnosis not present

## 2016-12-26 DIAGNOSIS — N186 End stage renal disease: Secondary | ICD-10-CM | POA: Diagnosis not present

## 2016-12-28 DIAGNOSIS — E1129 Type 2 diabetes mellitus with other diabetic kidney complication: Secondary | ICD-10-CM | POA: Diagnosis not present

## 2016-12-28 DIAGNOSIS — D631 Anemia in chronic kidney disease: Secondary | ICD-10-CM | POA: Diagnosis not present

## 2016-12-28 DIAGNOSIS — N2581 Secondary hyperparathyroidism of renal origin: Secondary | ICD-10-CM | POA: Diagnosis not present

## 2016-12-28 DIAGNOSIS — D689 Coagulation defect, unspecified: Secondary | ICD-10-CM | POA: Diagnosis not present

## 2016-12-28 DIAGNOSIS — N186 End stage renal disease: Secondary | ICD-10-CM | POA: Diagnosis not present

## 2016-12-30 DIAGNOSIS — Z992 Dependence on renal dialysis: Secondary | ICD-10-CM | POA: Diagnosis not present

## 2016-12-30 DIAGNOSIS — E1129 Type 2 diabetes mellitus with other diabetic kidney complication: Secondary | ICD-10-CM | POA: Diagnosis not present

## 2016-12-30 DIAGNOSIS — D631 Anemia in chronic kidney disease: Secondary | ICD-10-CM | POA: Diagnosis not present

## 2016-12-30 DIAGNOSIS — I120 Hypertensive chronic kidney disease with stage 5 chronic kidney disease or end stage renal disease: Secondary | ICD-10-CM | POA: Diagnosis not present

## 2016-12-30 DIAGNOSIS — D689 Coagulation defect, unspecified: Secondary | ICD-10-CM | POA: Diagnosis not present

## 2016-12-30 DIAGNOSIS — N2581 Secondary hyperparathyroidism of renal origin: Secondary | ICD-10-CM | POA: Diagnosis not present

## 2016-12-30 DIAGNOSIS — N186 End stage renal disease: Secondary | ICD-10-CM | POA: Diagnosis not present

## 2017-01-01 DIAGNOSIS — Z992 Dependence on renal dialysis: Secondary | ICD-10-CM | POA: Diagnosis not present

## 2017-01-01 DIAGNOSIS — N186 End stage renal disease: Secondary | ICD-10-CM | POA: Diagnosis not present

## 2017-01-01 DIAGNOSIS — T8249XA Other complication of vascular dialysis catheter, initial encounter: Secondary | ICD-10-CM | POA: Diagnosis not present

## 2017-01-02 DIAGNOSIS — N2581 Secondary hyperparathyroidism of renal origin: Secondary | ICD-10-CM | POA: Diagnosis not present

## 2017-01-02 DIAGNOSIS — D631 Anemia in chronic kidney disease: Secondary | ICD-10-CM | POA: Diagnosis not present

## 2017-01-02 DIAGNOSIS — E1129 Type 2 diabetes mellitus with other diabetic kidney complication: Secondary | ICD-10-CM | POA: Diagnosis not present

## 2017-01-02 DIAGNOSIS — N186 End stage renal disease: Secondary | ICD-10-CM | POA: Diagnosis not present

## 2017-01-04 DIAGNOSIS — N2581 Secondary hyperparathyroidism of renal origin: Secondary | ICD-10-CM | POA: Diagnosis not present

## 2017-01-04 DIAGNOSIS — E1129 Type 2 diabetes mellitus with other diabetic kidney complication: Secondary | ICD-10-CM | POA: Diagnosis not present

## 2017-01-04 DIAGNOSIS — D631 Anemia in chronic kidney disease: Secondary | ICD-10-CM | POA: Diagnosis not present

## 2017-01-04 DIAGNOSIS — N186 End stage renal disease: Secondary | ICD-10-CM | POA: Diagnosis not present

## 2017-01-07 DIAGNOSIS — E1129 Type 2 diabetes mellitus with other diabetic kidney complication: Secondary | ICD-10-CM | POA: Diagnosis not present

## 2017-01-07 DIAGNOSIS — N2581 Secondary hyperparathyroidism of renal origin: Secondary | ICD-10-CM | POA: Diagnosis not present

## 2017-01-07 DIAGNOSIS — N186 End stage renal disease: Secondary | ICD-10-CM | POA: Diagnosis not present

## 2017-01-07 DIAGNOSIS — D631 Anemia in chronic kidney disease: Secondary | ICD-10-CM | POA: Diagnosis not present

## 2017-01-08 DIAGNOSIS — M25561 Pain in right knee: Secondary | ICD-10-CM | POA: Diagnosis not present

## 2017-01-08 DIAGNOSIS — M25562 Pain in left knee: Secondary | ICD-10-CM | POA: Diagnosis not present

## 2017-01-08 DIAGNOSIS — M170 Bilateral primary osteoarthritis of knee: Secondary | ICD-10-CM | POA: Diagnosis not present

## 2017-01-09 DIAGNOSIS — N2581 Secondary hyperparathyroidism of renal origin: Secondary | ICD-10-CM | POA: Diagnosis not present

## 2017-01-09 DIAGNOSIS — E1129 Type 2 diabetes mellitus with other diabetic kidney complication: Secondary | ICD-10-CM | POA: Diagnosis not present

## 2017-01-09 DIAGNOSIS — D631 Anemia in chronic kidney disease: Secondary | ICD-10-CM | POA: Diagnosis not present

## 2017-01-09 DIAGNOSIS — N186 End stage renal disease: Secondary | ICD-10-CM | POA: Diagnosis not present

## 2017-01-11 DIAGNOSIS — D631 Anemia in chronic kidney disease: Secondary | ICD-10-CM | POA: Diagnosis not present

## 2017-01-11 DIAGNOSIS — N186 End stage renal disease: Secondary | ICD-10-CM | POA: Diagnosis not present

## 2017-01-11 DIAGNOSIS — N2581 Secondary hyperparathyroidism of renal origin: Secondary | ICD-10-CM | POA: Diagnosis not present

## 2017-01-11 DIAGNOSIS — E1129 Type 2 diabetes mellitus with other diabetic kidney complication: Secondary | ICD-10-CM | POA: Diagnosis not present

## 2017-01-14 DIAGNOSIS — D631 Anemia in chronic kidney disease: Secondary | ICD-10-CM | POA: Diagnosis not present

## 2017-01-14 DIAGNOSIS — N186 End stage renal disease: Secondary | ICD-10-CM | POA: Diagnosis not present

## 2017-01-14 DIAGNOSIS — E1129 Type 2 diabetes mellitus with other diabetic kidney complication: Secondary | ICD-10-CM | POA: Diagnosis not present

## 2017-01-14 DIAGNOSIS — N2581 Secondary hyperparathyroidism of renal origin: Secondary | ICD-10-CM | POA: Diagnosis not present

## 2017-01-15 DIAGNOSIS — M170 Bilateral primary osteoarthritis of knee: Secondary | ICD-10-CM | POA: Diagnosis not present

## 2017-01-15 DIAGNOSIS — M25561 Pain in right knee: Secondary | ICD-10-CM | POA: Diagnosis not present

## 2017-01-15 DIAGNOSIS — M25562 Pain in left knee: Secondary | ICD-10-CM | POA: Diagnosis not present

## 2017-01-16 DIAGNOSIS — E1129 Type 2 diabetes mellitus with other diabetic kidney complication: Secondary | ICD-10-CM | POA: Diagnosis not present

## 2017-01-16 DIAGNOSIS — N186 End stage renal disease: Secondary | ICD-10-CM | POA: Diagnosis not present

## 2017-01-16 DIAGNOSIS — N2581 Secondary hyperparathyroidism of renal origin: Secondary | ICD-10-CM | POA: Diagnosis not present

## 2017-01-16 DIAGNOSIS — D631 Anemia in chronic kidney disease: Secondary | ICD-10-CM | POA: Diagnosis not present

## 2017-01-18 DIAGNOSIS — N186 End stage renal disease: Secondary | ICD-10-CM | POA: Diagnosis not present

## 2017-01-18 DIAGNOSIS — N2581 Secondary hyperparathyroidism of renal origin: Secondary | ICD-10-CM | POA: Diagnosis not present

## 2017-01-18 DIAGNOSIS — D631 Anemia in chronic kidney disease: Secondary | ICD-10-CM | POA: Diagnosis not present

## 2017-01-18 DIAGNOSIS — E1129 Type 2 diabetes mellitus with other diabetic kidney complication: Secondary | ICD-10-CM | POA: Diagnosis not present

## 2017-01-21 DIAGNOSIS — E1129 Type 2 diabetes mellitus with other diabetic kidney complication: Secondary | ICD-10-CM | POA: Diagnosis not present

## 2017-01-21 DIAGNOSIS — N2581 Secondary hyperparathyroidism of renal origin: Secondary | ICD-10-CM | POA: Diagnosis not present

## 2017-01-21 DIAGNOSIS — D631 Anemia in chronic kidney disease: Secondary | ICD-10-CM | POA: Diagnosis not present

## 2017-01-21 DIAGNOSIS — N186 End stage renal disease: Secondary | ICD-10-CM | POA: Diagnosis not present

## 2017-01-22 DIAGNOSIS — M25561 Pain in right knee: Secondary | ICD-10-CM | POA: Diagnosis not present

## 2017-01-22 DIAGNOSIS — M170 Bilateral primary osteoarthritis of knee: Secondary | ICD-10-CM | POA: Diagnosis not present

## 2017-01-22 DIAGNOSIS — M25562 Pain in left knee: Secondary | ICD-10-CM | POA: Diagnosis not present

## 2017-01-23 DIAGNOSIS — N186 End stage renal disease: Secondary | ICD-10-CM | POA: Diagnosis not present

## 2017-01-23 DIAGNOSIS — N2581 Secondary hyperparathyroidism of renal origin: Secondary | ICD-10-CM | POA: Diagnosis not present

## 2017-01-23 DIAGNOSIS — D631 Anemia in chronic kidney disease: Secondary | ICD-10-CM | POA: Diagnosis not present

## 2017-01-23 DIAGNOSIS — E1129 Type 2 diabetes mellitus with other diabetic kidney complication: Secondary | ICD-10-CM | POA: Diagnosis not present

## 2017-01-25 DIAGNOSIS — D631 Anemia in chronic kidney disease: Secondary | ICD-10-CM | POA: Diagnosis not present

## 2017-01-25 DIAGNOSIS — E1129 Type 2 diabetes mellitus with other diabetic kidney complication: Secondary | ICD-10-CM | POA: Diagnosis not present

## 2017-01-25 DIAGNOSIS — N2581 Secondary hyperparathyroidism of renal origin: Secondary | ICD-10-CM | POA: Diagnosis not present

## 2017-01-25 DIAGNOSIS — N186 End stage renal disease: Secondary | ICD-10-CM | POA: Diagnosis not present

## 2017-01-28 DIAGNOSIS — N2581 Secondary hyperparathyroidism of renal origin: Secondary | ICD-10-CM | POA: Diagnosis not present

## 2017-01-28 DIAGNOSIS — E1129 Type 2 diabetes mellitus with other diabetic kidney complication: Secondary | ICD-10-CM | POA: Diagnosis not present

## 2017-01-28 DIAGNOSIS — D631 Anemia in chronic kidney disease: Secondary | ICD-10-CM | POA: Diagnosis not present

## 2017-01-28 DIAGNOSIS — N186 End stage renal disease: Secondary | ICD-10-CM | POA: Diagnosis not present

## 2017-01-30 DIAGNOSIS — I120 Hypertensive chronic kidney disease with stage 5 chronic kidney disease or end stage renal disease: Secondary | ICD-10-CM | POA: Diagnosis not present

## 2017-01-30 DIAGNOSIS — D631 Anemia in chronic kidney disease: Secondary | ICD-10-CM | POA: Diagnosis not present

## 2017-01-30 DIAGNOSIS — E1129 Type 2 diabetes mellitus with other diabetic kidney complication: Secondary | ICD-10-CM | POA: Diagnosis not present

## 2017-01-30 DIAGNOSIS — N186 End stage renal disease: Secondary | ICD-10-CM | POA: Diagnosis not present

## 2017-01-30 DIAGNOSIS — Z992 Dependence on renal dialysis: Secondary | ICD-10-CM | POA: Diagnosis not present

## 2017-01-30 DIAGNOSIS — N2581 Secondary hyperparathyroidism of renal origin: Secondary | ICD-10-CM | POA: Diagnosis not present

## 2017-02-01 DIAGNOSIS — D631 Anemia in chronic kidney disease: Secondary | ICD-10-CM | POA: Diagnosis not present

## 2017-02-01 DIAGNOSIS — Z23 Encounter for immunization: Secondary | ICD-10-CM | POA: Diagnosis not present

## 2017-02-01 DIAGNOSIS — N186 End stage renal disease: Secondary | ICD-10-CM | POA: Diagnosis not present

## 2017-02-01 DIAGNOSIS — E1129 Type 2 diabetes mellitus with other diabetic kidney complication: Secondary | ICD-10-CM | POA: Diagnosis not present

## 2017-02-01 DIAGNOSIS — N2581 Secondary hyperparathyroidism of renal origin: Secondary | ICD-10-CM | POA: Diagnosis not present

## 2017-02-01 DIAGNOSIS — Z7721 Contact with and (suspected) exposure to potentially hazardous body fluids: Secondary | ICD-10-CM | POA: Diagnosis not present

## 2017-02-04 DIAGNOSIS — E1129 Type 2 diabetes mellitus with other diabetic kidney complication: Secondary | ICD-10-CM | POA: Diagnosis not present

## 2017-02-04 DIAGNOSIS — D631 Anemia in chronic kidney disease: Secondary | ICD-10-CM | POA: Diagnosis not present

## 2017-02-04 DIAGNOSIS — N186 End stage renal disease: Secondary | ICD-10-CM | POA: Diagnosis not present

## 2017-02-04 DIAGNOSIS — Z7721 Contact with and (suspected) exposure to potentially hazardous body fluids: Secondary | ICD-10-CM | POA: Diagnosis not present

## 2017-02-04 DIAGNOSIS — N2581 Secondary hyperparathyroidism of renal origin: Secondary | ICD-10-CM | POA: Diagnosis not present

## 2017-02-05 DIAGNOSIS — M170 Bilateral primary osteoarthritis of knee: Secondary | ICD-10-CM | POA: Diagnosis not present

## 2017-02-05 DIAGNOSIS — M25561 Pain in right knee: Secondary | ICD-10-CM | POA: Diagnosis not present

## 2017-02-05 DIAGNOSIS — M25562 Pain in left knee: Secondary | ICD-10-CM | POA: Diagnosis not present

## 2017-02-06 DIAGNOSIS — N2581 Secondary hyperparathyroidism of renal origin: Secondary | ICD-10-CM | POA: Diagnosis not present

## 2017-02-06 DIAGNOSIS — E1129 Type 2 diabetes mellitus with other diabetic kidney complication: Secondary | ICD-10-CM | POA: Diagnosis not present

## 2017-02-06 DIAGNOSIS — N186 End stage renal disease: Secondary | ICD-10-CM | POA: Diagnosis not present

## 2017-02-06 DIAGNOSIS — Z7721 Contact with and (suspected) exposure to potentially hazardous body fluids: Secondary | ICD-10-CM | POA: Diagnosis not present

## 2017-02-06 DIAGNOSIS — D631 Anemia in chronic kidney disease: Secondary | ICD-10-CM | POA: Diagnosis not present

## 2017-02-08 DIAGNOSIS — N186 End stage renal disease: Secondary | ICD-10-CM | POA: Diagnosis not present

## 2017-02-08 DIAGNOSIS — E1129 Type 2 diabetes mellitus with other diabetic kidney complication: Secondary | ICD-10-CM | POA: Diagnosis not present

## 2017-02-08 DIAGNOSIS — Z7721 Contact with and (suspected) exposure to potentially hazardous body fluids: Secondary | ICD-10-CM | POA: Diagnosis not present

## 2017-02-08 DIAGNOSIS — D631 Anemia in chronic kidney disease: Secondary | ICD-10-CM | POA: Diagnosis not present

## 2017-02-08 DIAGNOSIS — N2581 Secondary hyperparathyroidism of renal origin: Secondary | ICD-10-CM | POA: Diagnosis not present

## 2017-02-11 DIAGNOSIS — N186 End stage renal disease: Secondary | ICD-10-CM | POA: Diagnosis not present

## 2017-02-11 DIAGNOSIS — N2581 Secondary hyperparathyroidism of renal origin: Secondary | ICD-10-CM | POA: Diagnosis not present

## 2017-02-11 DIAGNOSIS — D631 Anemia in chronic kidney disease: Secondary | ICD-10-CM | POA: Diagnosis not present

## 2017-02-11 DIAGNOSIS — Z7721 Contact with and (suspected) exposure to potentially hazardous body fluids: Secondary | ICD-10-CM | POA: Diagnosis not present

## 2017-02-11 DIAGNOSIS — E1129 Type 2 diabetes mellitus with other diabetic kidney complication: Secondary | ICD-10-CM | POA: Diagnosis not present

## 2017-02-13 DIAGNOSIS — D631 Anemia in chronic kidney disease: Secondary | ICD-10-CM | POA: Diagnosis not present

## 2017-02-13 DIAGNOSIS — E1129 Type 2 diabetes mellitus with other diabetic kidney complication: Secondary | ICD-10-CM | POA: Diagnosis not present

## 2017-02-13 DIAGNOSIS — N2581 Secondary hyperparathyroidism of renal origin: Secondary | ICD-10-CM | POA: Diagnosis not present

## 2017-02-13 DIAGNOSIS — Z7721 Contact with and (suspected) exposure to potentially hazardous body fluids: Secondary | ICD-10-CM | POA: Diagnosis not present

## 2017-02-13 DIAGNOSIS — N186 End stage renal disease: Secondary | ICD-10-CM | POA: Diagnosis not present

## 2017-02-15 DIAGNOSIS — D631 Anemia in chronic kidney disease: Secondary | ICD-10-CM | POA: Diagnosis not present

## 2017-02-15 DIAGNOSIS — N186 End stage renal disease: Secondary | ICD-10-CM | POA: Diagnosis not present

## 2017-02-15 DIAGNOSIS — N2581 Secondary hyperparathyroidism of renal origin: Secondary | ICD-10-CM | POA: Diagnosis not present

## 2017-02-15 DIAGNOSIS — E1129 Type 2 diabetes mellitus with other diabetic kidney complication: Secondary | ICD-10-CM | POA: Diagnosis not present

## 2017-02-15 DIAGNOSIS — Z7721 Contact with and (suspected) exposure to potentially hazardous body fluids: Secondary | ICD-10-CM | POA: Diagnosis not present

## 2017-02-18 DIAGNOSIS — Z7721 Contact with and (suspected) exposure to potentially hazardous body fluids: Secondary | ICD-10-CM | POA: Diagnosis not present

## 2017-02-18 DIAGNOSIS — N2581 Secondary hyperparathyroidism of renal origin: Secondary | ICD-10-CM | POA: Diagnosis not present

## 2017-02-18 DIAGNOSIS — D631 Anemia in chronic kidney disease: Secondary | ICD-10-CM | POA: Diagnosis not present

## 2017-02-18 DIAGNOSIS — N186 End stage renal disease: Secondary | ICD-10-CM | POA: Diagnosis not present

## 2017-02-18 DIAGNOSIS — E1129 Type 2 diabetes mellitus with other diabetic kidney complication: Secondary | ICD-10-CM | POA: Diagnosis not present

## 2017-02-19 ENCOUNTER — Ambulatory Visit (INDEPENDENT_AMBULATORY_CARE_PROVIDER_SITE_OTHER): Payer: Medicare Other | Admitting: Adult Health

## 2017-02-19 ENCOUNTER — Encounter: Payer: Self-pay | Admitting: Adult Health

## 2017-02-19 VITALS — BP 144/80 | Ht <= 58 in | Wt 127.0 lb

## 2017-02-19 DIAGNOSIS — Z992 Dependence on renal dialysis: Secondary | ICD-10-CM | POA: Diagnosis not present

## 2017-02-19 DIAGNOSIS — I10 Essential (primary) hypertension: Secondary | ICD-10-CM

## 2017-02-19 DIAGNOSIS — N186 End stage renal disease: Secondary | ICD-10-CM

## 2017-02-19 DIAGNOSIS — Z23 Encounter for immunization: Secondary | ICD-10-CM | POA: Diagnosis not present

## 2017-02-19 DIAGNOSIS — Z7689 Persons encountering health services in other specified circumstances: Secondary | ICD-10-CM | POA: Diagnosis not present

## 2017-02-19 DIAGNOSIS — E1122 Type 2 diabetes mellitus with diabetic chronic kidney disease: Secondary | ICD-10-CM
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
## 2017-02-19 NOTE — Patient Instructions (Signed)
It was great meeting you today.   Please stop by the front desk and sign a release of information for your kidney doctor.   Follow up with me if you have any issues.

## 2017-02-20 DIAGNOSIS — Z7721 Contact with and (suspected) exposure to potentially hazardous body fluids: Secondary | ICD-10-CM | POA: Diagnosis not present

## 2017-02-20 DIAGNOSIS — E1129 Type 2 diabetes mellitus with other diabetic kidney complication: Secondary | ICD-10-CM | POA: Diagnosis not present

## 2017-02-20 DIAGNOSIS — N186 End stage renal disease: Secondary | ICD-10-CM | POA: Diagnosis not present

## 2017-02-20 DIAGNOSIS — N2581 Secondary hyperparathyroidism of renal origin: Secondary | ICD-10-CM | POA: Diagnosis not present

## 2017-02-20 DIAGNOSIS — D631 Anemia in chronic kidney disease: Secondary | ICD-10-CM | POA: Diagnosis not present

## 2017-02-22 DIAGNOSIS — D631 Anemia in chronic kidney disease: Secondary | ICD-10-CM | POA: Diagnosis not present

## 2017-02-22 DIAGNOSIS — N186 End stage renal disease: Secondary | ICD-10-CM | POA: Diagnosis not present

## 2017-02-22 DIAGNOSIS — Z7721 Contact with and (suspected) exposure to potentially hazardous body fluids: Secondary | ICD-10-CM | POA: Diagnosis not present

## 2017-02-22 DIAGNOSIS — E1129 Type 2 diabetes mellitus with other diabetic kidney complication: Secondary | ICD-10-CM | POA: Diagnosis not present

## 2017-02-22 DIAGNOSIS — N2581 Secondary hyperparathyroidism of renal origin: Secondary | ICD-10-CM | POA: Diagnosis not present

## 2017-02-26 DIAGNOSIS — H401132 Primary open-angle glaucoma, bilateral, moderate stage: Secondary | ICD-10-CM | POA: Diagnosis not present

## 2017-02-27 DIAGNOSIS — N2581 Secondary hyperparathyroidism of renal origin: Secondary | ICD-10-CM | POA: Diagnosis not present

## 2017-02-27 DIAGNOSIS — I120 Hypertensive chronic kidney disease with stage 5 chronic kidney disease or end stage renal disease: Secondary | ICD-10-CM | POA: Diagnosis not present

## 2017-02-27 DIAGNOSIS — Z992 Dependence on renal dialysis: Secondary | ICD-10-CM | POA: Diagnosis not present

## 2017-02-27 DIAGNOSIS — E1129 Type 2 diabetes mellitus with other diabetic kidney complication: Secondary | ICD-10-CM | POA: Diagnosis not present

## 2017-02-27 DIAGNOSIS — D631 Anemia in chronic kidney disease: Secondary | ICD-10-CM | POA: Diagnosis not present

## 2017-02-27 DIAGNOSIS — N186 End stage renal disease: Secondary | ICD-10-CM | POA: Diagnosis not present

## 2017-02-27 DIAGNOSIS — Z7721 Contact with and (suspected) exposure to potentially hazardous body fluids: Secondary | ICD-10-CM | POA: Diagnosis not present

## 2017-03-01 DIAGNOSIS — D631 Anemia in chronic kidney disease: Secondary | ICD-10-CM | POA: Diagnosis not present

## 2017-03-01 DIAGNOSIS — N2581 Secondary hyperparathyroidism of renal origin: Secondary | ICD-10-CM | POA: Diagnosis not present

## 2017-03-01 DIAGNOSIS — Z992 Dependence on renal dialysis: Secondary | ICD-10-CM | POA: Diagnosis not present

## 2017-03-01 DIAGNOSIS — N186 End stage renal disease: Secondary | ICD-10-CM | POA: Diagnosis not present

## 2017-03-01 DIAGNOSIS — E1129 Type 2 diabetes mellitus with other diabetic kidney complication: Secondary | ICD-10-CM | POA: Diagnosis not present

## 2017-03-04 DIAGNOSIS — N2581 Secondary hyperparathyroidism of renal origin: Secondary | ICD-10-CM | POA: Diagnosis not present

## 2017-03-04 DIAGNOSIS — N186 End stage renal disease: Secondary | ICD-10-CM | POA: Diagnosis not present

## 2017-03-04 DIAGNOSIS — E1129 Type 2 diabetes mellitus with other diabetic kidney complication: Secondary | ICD-10-CM | POA: Diagnosis not present

## 2017-03-04 DIAGNOSIS — Z992 Dependence on renal dialysis: Secondary | ICD-10-CM | POA: Diagnosis not present

## 2017-03-04 DIAGNOSIS — D631 Anemia in chronic kidney disease: Secondary | ICD-10-CM | POA: Diagnosis not present

## 2017-03-06 DIAGNOSIS — N186 End stage renal disease: Secondary | ICD-10-CM | POA: Diagnosis not present

## 2017-03-06 DIAGNOSIS — E1129 Type 2 diabetes mellitus with other diabetic kidney complication: Secondary | ICD-10-CM | POA: Diagnosis not present

## 2017-03-06 DIAGNOSIS — D631 Anemia in chronic kidney disease: Secondary | ICD-10-CM | POA: Diagnosis not present

## 2017-03-06 DIAGNOSIS — Z992 Dependence on renal dialysis: Secondary | ICD-10-CM | POA: Diagnosis not present

## 2017-03-06 DIAGNOSIS — N2581 Secondary hyperparathyroidism of renal origin: Secondary | ICD-10-CM | POA: Diagnosis not present

## 2017-03-08 DIAGNOSIS — Z992 Dependence on renal dialysis: Secondary | ICD-10-CM | POA: Diagnosis not present

## 2017-03-08 DIAGNOSIS — D631 Anemia in chronic kidney disease: Secondary | ICD-10-CM | POA: Diagnosis not present

## 2017-03-08 DIAGNOSIS — N2581 Secondary hyperparathyroidism of renal origin: Secondary | ICD-10-CM | POA: Diagnosis not present

## 2017-03-08 DIAGNOSIS — N186 End stage renal disease: Secondary | ICD-10-CM | POA: Diagnosis not present

## 2017-03-08 DIAGNOSIS — E1129 Type 2 diabetes mellitus with other diabetic kidney complication: Secondary | ICD-10-CM | POA: Diagnosis not present

## 2017-03-11 DIAGNOSIS — Z992 Dependence on renal dialysis: Secondary | ICD-10-CM | POA: Diagnosis not present

## 2017-03-11 DIAGNOSIS — D631 Anemia in chronic kidney disease: Secondary | ICD-10-CM | POA: Diagnosis not present

## 2017-03-11 DIAGNOSIS — E1129 Type 2 diabetes mellitus with other diabetic kidney complication: Secondary | ICD-10-CM | POA: Diagnosis not present

## 2017-03-11 DIAGNOSIS — N186 End stage renal disease: Secondary | ICD-10-CM | POA: Diagnosis not present

## 2017-03-11 DIAGNOSIS — N2581 Secondary hyperparathyroidism of renal origin: Secondary | ICD-10-CM | POA: Diagnosis not present

## 2017-03-13 DIAGNOSIS — N186 End stage renal disease: Secondary | ICD-10-CM | POA: Diagnosis not present

## 2017-03-13 DIAGNOSIS — N2581 Secondary hyperparathyroidism of renal origin: Secondary | ICD-10-CM | POA: Diagnosis not present

## 2017-03-13 DIAGNOSIS — Z992 Dependence on renal dialysis: Secondary | ICD-10-CM | POA: Diagnosis not present

## 2017-03-13 DIAGNOSIS — D631 Anemia in chronic kidney disease: Secondary | ICD-10-CM | POA: Diagnosis not present

## 2017-03-13 DIAGNOSIS — E1129 Type 2 diabetes mellitus with other diabetic kidney complication: Secondary | ICD-10-CM | POA: Diagnosis not present

## 2017-03-15 DIAGNOSIS — E1129 Type 2 diabetes mellitus with other diabetic kidney complication: Secondary | ICD-10-CM | POA: Diagnosis not present

## 2017-03-15 DIAGNOSIS — Z992 Dependence on renal dialysis: Secondary | ICD-10-CM | POA: Diagnosis not present

## 2017-03-15 DIAGNOSIS — D631 Anemia in chronic kidney disease: Secondary | ICD-10-CM | POA: Diagnosis not present

## 2017-03-15 DIAGNOSIS — N186 End stage renal disease: Secondary | ICD-10-CM | POA: Diagnosis not present

## 2017-03-15 DIAGNOSIS — N2581 Secondary hyperparathyroidism of renal origin: Secondary | ICD-10-CM | POA: Diagnosis not present

## 2017-03-18 DIAGNOSIS — N2581 Secondary hyperparathyroidism of renal origin: Secondary | ICD-10-CM | POA: Diagnosis not present

## 2017-03-18 DIAGNOSIS — E1129 Type 2 diabetes mellitus with other diabetic kidney complication: Secondary | ICD-10-CM | POA: Diagnosis not present

## 2017-03-18 DIAGNOSIS — N186 End stage renal disease: Secondary | ICD-10-CM | POA: Diagnosis not present

## 2017-03-18 DIAGNOSIS — D631 Anemia in chronic kidney disease: Secondary | ICD-10-CM | POA: Diagnosis not present

## 2017-03-18 DIAGNOSIS — Z992 Dependence on renal dialysis: Secondary | ICD-10-CM | POA: Diagnosis not present

## 2017-03-20 DIAGNOSIS — D631 Anemia in chronic kidney disease: Secondary | ICD-10-CM | POA: Diagnosis not present

## 2017-03-20 DIAGNOSIS — N2581 Secondary hyperparathyroidism of renal origin: Secondary | ICD-10-CM | POA: Diagnosis not present

## 2017-03-20 DIAGNOSIS — E1129 Type 2 diabetes mellitus with other diabetic kidney complication: Secondary | ICD-10-CM | POA: Diagnosis not present

## 2017-03-20 DIAGNOSIS — N186 End stage renal disease: Secondary | ICD-10-CM | POA: Diagnosis not present

## 2017-03-20 DIAGNOSIS — Z992 Dependence on renal dialysis: Secondary | ICD-10-CM | POA: Diagnosis not present

## 2017-03-22 DIAGNOSIS — D631 Anemia in chronic kidney disease: Secondary | ICD-10-CM | POA: Diagnosis not present

## 2017-03-22 DIAGNOSIS — N186 End stage renal disease: Secondary | ICD-10-CM | POA: Diagnosis not present

## 2017-03-22 DIAGNOSIS — E1129 Type 2 diabetes mellitus with other diabetic kidney complication: Secondary | ICD-10-CM | POA: Diagnosis not present

## 2017-03-22 DIAGNOSIS — Z992 Dependence on renal dialysis: Secondary | ICD-10-CM | POA: Diagnosis not present

## 2017-03-22 DIAGNOSIS — N2581 Secondary hyperparathyroidism of renal origin: Secondary | ICD-10-CM | POA: Diagnosis not present

## 2017-03-25 DIAGNOSIS — N186 End stage renal disease: Secondary | ICD-10-CM | POA: Diagnosis not present

## 2017-03-25 DIAGNOSIS — D631 Anemia in chronic kidney disease: Secondary | ICD-10-CM | POA: Diagnosis not present

## 2017-03-25 DIAGNOSIS — E1129 Type 2 diabetes mellitus with other diabetic kidney complication: Secondary | ICD-10-CM | POA: Diagnosis not present

## 2017-03-25 DIAGNOSIS — N2581 Secondary hyperparathyroidism of renal origin: Secondary | ICD-10-CM | POA: Diagnosis not present

## 2017-03-25 DIAGNOSIS — Z992 Dependence on renal dialysis: Secondary | ICD-10-CM | POA: Diagnosis not present

## 2017-03-27 DIAGNOSIS — E1129 Type 2 diabetes mellitus with other diabetic kidney complication: Secondary | ICD-10-CM | POA: Diagnosis not present

## 2017-03-27 DIAGNOSIS — D631 Anemia in chronic kidney disease: Secondary | ICD-10-CM | POA: Diagnosis not present

## 2017-03-27 DIAGNOSIS — N186 End stage renal disease: Secondary | ICD-10-CM | POA: Diagnosis not present

## 2017-03-27 DIAGNOSIS — Z992 Dependence on renal dialysis: Secondary | ICD-10-CM | POA: Diagnosis not present

## 2017-03-27 DIAGNOSIS — N2581 Secondary hyperparathyroidism of renal origin: Secondary | ICD-10-CM | POA: Diagnosis not present

## 2017-03-29 DIAGNOSIS — N2581 Secondary hyperparathyroidism of renal origin: Secondary | ICD-10-CM | POA: Diagnosis not present

## 2017-03-29 DIAGNOSIS — E1129 Type 2 diabetes mellitus with other diabetic kidney complication: Secondary | ICD-10-CM | POA: Diagnosis not present

## 2017-03-29 DIAGNOSIS — D631 Anemia in chronic kidney disease: Secondary | ICD-10-CM | POA: Diagnosis not present

## 2017-03-29 DIAGNOSIS — Z992 Dependence on renal dialysis: Secondary | ICD-10-CM | POA: Diagnosis not present

## 2017-03-29 DIAGNOSIS — N186 End stage renal disease: Secondary | ICD-10-CM | POA: Diagnosis not present

## 2017-03-30 DIAGNOSIS — I120 Hypertensive chronic kidney disease with stage 5 chronic kidney disease or end stage renal disease: Secondary | ICD-10-CM | POA: Diagnosis not present

## 2017-03-30 DIAGNOSIS — N186 End stage renal disease: Secondary | ICD-10-CM | POA: Diagnosis not present

## 2017-03-30 DIAGNOSIS — Z992 Dependence on renal dialysis: Secondary | ICD-10-CM | POA: Diagnosis not present

## 2017-04-01 DIAGNOSIS — N186 End stage renal disease: Secondary | ICD-10-CM | POA: Diagnosis not present

## 2017-04-01 DIAGNOSIS — D631 Anemia in chronic kidney disease: Secondary | ICD-10-CM | POA: Diagnosis not present

## 2017-04-01 DIAGNOSIS — N2581 Secondary hyperparathyroidism of renal origin: Secondary | ICD-10-CM | POA: Diagnosis not present

## 2017-04-01 DIAGNOSIS — E1129 Type 2 diabetes mellitus with other diabetic kidney complication: Secondary | ICD-10-CM | POA: Diagnosis not present

## 2017-04-02 ENCOUNTER — Other Ambulatory Visit: Payer: Self-pay | Admitting: Cardiology

## 2017-04-03 DIAGNOSIS — E1129 Type 2 diabetes mellitus with other diabetic kidney complication: Secondary | ICD-10-CM | POA: Diagnosis not present

## 2017-04-03 DIAGNOSIS — N2581 Secondary hyperparathyroidism of renal origin: Secondary | ICD-10-CM | POA: Diagnosis not present

## 2017-04-03 DIAGNOSIS — N186 End stage renal disease: Secondary | ICD-10-CM | POA: Diagnosis not present

## 2017-04-03 DIAGNOSIS — D631 Anemia in chronic kidney disease: Secondary | ICD-10-CM | POA: Diagnosis not present

## 2017-04-05 DIAGNOSIS — D631 Anemia in chronic kidney disease: Secondary | ICD-10-CM | POA: Diagnosis not present

## 2017-04-05 DIAGNOSIS — E1129 Type 2 diabetes mellitus with other diabetic kidney complication: Secondary | ICD-10-CM | POA: Diagnosis not present

## 2017-04-05 DIAGNOSIS — N186 End stage renal disease: Secondary | ICD-10-CM | POA: Diagnosis not present

## 2017-04-05 DIAGNOSIS — N2581 Secondary hyperparathyroidism of renal origin: Secondary | ICD-10-CM | POA: Diagnosis not present

## 2017-04-08 DIAGNOSIS — N186 End stage renal disease: Secondary | ICD-10-CM | POA: Diagnosis not present

## 2017-04-08 DIAGNOSIS — N2581 Secondary hyperparathyroidism of renal origin: Secondary | ICD-10-CM | POA: Diagnosis not present

## 2017-04-08 DIAGNOSIS — E1129 Type 2 diabetes mellitus with other diabetic kidney complication: Secondary | ICD-10-CM | POA: Diagnosis not present

## 2017-04-08 DIAGNOSIS — D631 Anemia in chronic kidney disease: Secondary | ICD-10-CM | POA: Diagnosis not present

## 2017-04-10 ENCOUNTER — Encounter (HOSPITAL_COMMUNITY): Payer: Self-pay

## 2017-04-10 ENCOUNTER — Inpatient Hospital Stay (HOSPITAL_COMMUNITY)
Admission: EM | Admit: 2017-04-10 | Discharge: 2017-04-13 | DRG: 555 | Disposition: A | Discharge status: Home Health | Payer: Medicare Other | Attending: Internal Medicine | Admitting: Internal Medicine

## 2017-04-10 ENCOUNTER — Emergency Department (HOSPITAL_COMMUNITY): Payer: Medicare Other

## 2017-04-10 DIAGNOSIS — K219 Gastro-esophageal reflux disease without esophagitis: Secondary | ICD-10-CM | POA: Diagnosis present

## 2017-04-10 DIAGNOSIS — Z90710 Acquired absence of both cervix and uterus: Secondary | ICD-10-CM | POA: Diagnosis not present

## 2017-04-10 DIAGNOSIS — Z7982 Long term (current) use of aspirin: Secondary | ICD-10-CM

## 2017-04-10 DIAGNOSIS — M109 Gout, unspecified: Secondary | ICD-10-CM | POA: Diagnosis present

## 2017-04-10 DIAGNOSIS — S7012XA Contusion of left thigh, initial encounter: Secondary | ICD-10-CM | POA: Diagnosis not present

## 2017-04-10 DIAGNOSIS — Z8249 Family history of ischemic heart disease and other diseases of the circulatory system: Secondary | ICD-10-CM | POA: Diagnosis not present

## 2017-04-10 DIAGNOSIS — I120 Hypertensive chronic kidney disease with stage 5 chronic kidney disease or end stage renal disease: Secondary | ICD-10-CM | POA: Diagnosis present

## 2017-04-10 DIAGNOSIS — Z87891 Personal history of nicotine dependence: Secondary | ICD-10-CM

## 2017-04-10 DIAGNOSIS — E1151 Type 2 diabetes mellitus with diabetic peripheral angiopathy without gangrene: Secondary | ICD-10-CM | POA: Diagnosis present

## 2017-04-10 DIAGNOSIS — E875 Hyperkalemia: Secondary | ICD-10-CM | POA: Diagnosis present

## 2017-04-10 DIAGNOSIS — G8929 Other chronic pain: Secondary | ICD-10-CM | POA: Diagnosis present

## 2017-04-10 DIAGNOSIS — Z992 Dependence on renal dialysis: Secondary | ICD-10-CM

## 2017-04-10 DIAGNOSIS — N186 End stage renal disease: Secondary | ICD-10-CM | POA: Diagnosis present

## 2017-04-10 DIAGNOSIS — N2581 Secondary hyperparathyroidism of renal origin: Secondary | ICD-10-CM | POA: Diagnosis present

## 2017-04-10 DIAGNOSIS — M79606 Pain in leg, unspecified: Secondary | ICD-10-CM | POA: Diagnosis not present

## 2017-04-10 DIAGNOSIS — Z89432 Acquired absence of left foot: Secondary | ICD-10-CM

## 2017-04-10 DIAGNOSIS — I10 Essential (primary) hypertension: Secondary | ICD-10-CM | POA: Diagnosis present

## 2017-04-10 DIAGNOSIS — I080 Rheumatic disorders of both mitral and aortic valves: Secondary | ICD-10-CM | POA: Diagnosis present

## 2017-04-10 DIAGNOSIS — R1032 Left lower quadrant pain: Secondary | ICD-10-CM | POA: Diagnosis not present

## 2017-04-10 DIAGNOSIS — N63 Unspecified lump in unspecified breast: Secondary | ICD-10-CM | POA: Diagnosis present

## 2017-04-10 DIAGNOSIS — E8889 Other specified metabolic disorders: Secondary | ICD-10-CM | POA: Diagnosis present

## 2017-04-10 DIAGNOSIS — R1030 Lower abdominal pain, unspecified: Secondary | ICD-10-CM | POA: Diagnosis not present

## 2017-04-10 DIAGNOSIS — M1990 Unspecified osteoarthritis, unspecified site: Secondary | ICD-10-CM | POA: Diagnosis present

## 2017-04-10 DIAGNOSIS — Z888 Allergy status to other drugs, medicaments and biological substances status: Secondary | ICD-10-CM | POA: Diagnosis not present

## 2017-04-10 DIAGNOSIS — Z8261 Family history of arthritis: Secondary | ICD-10-CM

## 2017-04-10 DIAGNOSIS — E1129 Type 2 diabetes mellitus with other diabetic kidney complication: Secondary | ICD-10-CM | POA: Diagnosis not present

## 2017-04-10 DIAGNOSIS — Z79899 Other long term (current) drug therapy: Secondary | ICD-10-CM

## 2017-04-10 DIAGNOSIS — E785 Hyperlipidemia, unspecified: Secondary | ICD-10-CM | POA: Diagnosis present

## 2017-04-10 DIAGNOSIS — M549 Dorsalgia, unspecified: Secondary | ICD-10-CM | POA: Diagnosis present

## 2017-04-10 DIAGNOSIS — D638 Anemia in other chronic diseases classified elsewhere: Secondary | ICD-10-CM | POA: Diagnosis present

## 2017-04-10 DIAGNOSIS — S300XXA Contusion of lower back and pelvis, initial encounter: Secondary | ICD-10-CM | POA: Diagnosis not present

## 2017-04-10 DIAGNOSIS — M7981 Nontraumatic hematoma of soft tissue: Secondary | ICD-10-CM | POA: Diagnosis not present

## 2017-04-10 DIAGNOSIS — M79609 Pain in unspecified limb: Secondary | ICD-10-CM | POA: Diagnosis not present

## 2017-04-10 DIAGNOSIS — E1122 Type 2 diabetes mellitus with diabetic chronic kidney disease: Secondary | ICD-10-CM | POA: Diagnosis present

## 2017-04-10 DIAGNOSIS — D631 Anemia in chronic kidney disease: Secondary | ICD-10-CM | POA: Diagnosis not present

## 2017-04-10 DIAGNOSIS — S7010XA Contusion of unspecified thigh, initial encounter: Secondary | ICD-10-CM | POA: Diagnosis present

## 2017-04-10 HISTORY — DX: Contusion of unspecified thigh, initial encounter: S70.10XA

## 2017-04-10 LAB — CBC WITH DIFFERENTIAL/PLATELET
Basophils Absolute: 0 10*3/uL (ref 0.0–0.1)
Basophils Relative: 0 %
EOS PCT: 0 %
Eosinophils Absolute: 0 10*3/uL (ref 0.0–0.7)
HCT: 31.1 % — ABNORMAL LOW (ref 36.0–46.0)
Hemoglobin: 10.1 g/dL — ABNORMAL LOW (ref 12.0–15.0)
LYMPHS ABS: 0.7 10*3/uL (ref 0.7–4.0)
LYMPHS PCT: 9 %
MCH: 31.2 pg (ref 26.0–34.0)
MCHC: 32.5 g/dL (ref 30.0–36.0)
MCV: 96 fL (ref 78.0–100.0)
MONO ABS: 0.3 10*3/uL (ref 0.1–1.0)
Monocytes Relative: 4 %
Neutro Abs: 6.6 10*3/uL (ref 1.7–7.7)
Neutrophils Relative %: 87 %
PLATELETS: 193 10*3/uL (ref 150–400)
RBC: 3.24 MIL/uL — ABNORMAL LOW (ref 3.87–5.11)
RDW: 15.9 % — AB (ref 11.5–15.5)
WBC: 7.6 10*3/uL (ref 4.0–10.5)

## 2017-04-10 LAB — BASIC METABOLIC PANEL
Anion gap: 16 — ABNORMAL HIGH (ref 5–15)
BUN: 16 mg/dL (ref 6–20)
CALCIUM: 9.4 mg/dL (ref 8.9–10.3)
CO2: 25 mmol/L (ref 22–32)
Chloride: 96 mmol/L — ABNORMAL LOW (ref 101–111)
Creatinine, Ser: 3.92 mg/dL — ABNORMAL HIGH (ref 0.44–1.00)
GFR calc Af Amer: 12 mL/min — ABNORMAL LOW (ref >60)
GFR, EST NON AFRICAN AMERICAN: 10 mL/min — AB (ref >60)
GLUCOSE: 153 mg/dL — AB (ref 65–99)
POTASSIUM: 4.2 mmol/L (ref 3.5–5.1)
Sodium: 137 mmol/L (ref 135–145)

## 2017-04-10 MED ORDER — MORPHINE SULFATE (PF) 4 MG/ML IV SOLN
INTRAVENOUS | Status: AC
  Filled 2017-04-10: qty 1

## 2017-04-10 MED ORDER — ACETAMINOPHEN 650 MG RE SUPP: 650.0000 mg | Freq: Four times a day (QID) | RECTAL | Status: DC | PRN

## 2017-04-10 MED ORDER — IOPAMIDOL (ISOVUE-300) INJECTION 61%
INTRAVENOUS | Status: AC
  Administered 2017-04-10: 100 mL
  Filled 2017-04-10: qty 100

## 2017-04-10 MED ORDER — PANTOPRAZOLE SODIUM 40 MG PO TBEC: 40.0000 mg | DELAYED_RELEASE_TABLET | Freq: Every evening | ORAL | Status: DC | PRN

## 2017-04-10 MED ORDER — LATANOPROST 0.005 % OP SOLN
1.0000 [drp] | Freq: Every day | OPHTHALMIC | Status: DC
  Administered 2017-04-10 – 2017-04-12 (×3): 1 [drp] via OPHTHALMIC
  Filled 2017-04-10: qty 2.5

## 2017-04-10 MED ORDER — AMLODIPINE BESYLATE 5 MG PO TABS
2.5000 mg | ORAL_TABLET | Freq: Every day | ORAL | Status: DC
  Administered 2017-04-10: 2.5 mg via ORAL
  Filled 2017-04-10: qty 1

## 2017-04-10 MED ORDER — ONDANSETRON HCL 4 MG/2ML IJ SOLN
4.0000 mg | Freq: Four times a day (QID) | INTRAMUSCULAR | Status: DC | PRN
  Administered 2017-04-10 – 2017-04-11 (×2): 4 mg via INTRAVENOUS
  Filled 2017-04-10 (×2): qty 2

## 2017-04-10 MED ORDER — CLONIDINE HCL 0.1 MG PO TABS
0.1000 mg | ORAL_TABLET | Freq: Every day | ORAL | Status: DC
  Administered 2017-04-10 – 2017-04-12 (×3): 0.1 mg via ORAL
  Filled 2017-04-10 (×3): qty 1

## 2017-04-10 MED ORDER — MORPHINE SULFATE (PF) 4 MG/ML IV SOLN
2.0000 mg | INTRAVENOUS | Status: DC | PRN
  Administered 2017-04-10: 2 mg via INTRAVENOUS

## 2017-04-10 MED ORDER — OXYCODONE HCL 5 MG PO TABS
5.0000 mg | ORAL_TABLET | ORAL | Status: DC | PRN
  Administered 2017-04-10 – 2017-04-12 (×2): 5 mg via ORAL
  Filled 2017-04-10: qty 1

## 2017-04-10 MED ORDER — METOPROLOL SUCCINATE ER 50 MG PO TB24
50.0000 mg | ORAL_TABLET | Freq: Every day | ORAL | Status: DC
  Administered 2017-04-10: 50 mg via ORAL
  Filled 2017-04-10: qty 1

## 2017-04-10 MED ORDER — FENTANYL CITRATE (PF) 100 MCG/2ML IJ SOLN
50.0000 ug | Freq: Once | INTRAMUSCULAR | Status: AC
  Administered 2017-04-10: 50 ug via INTRAVENOUS
  Filled 2017-04-10: qty 2

## 2017-04-10 MED ORDER — HYDROCODONE-ACETAMINOPHEN 5-325 MG PO TABS
2.0000 | ORAL_TABLET | Freq: Once | ORAL | Status: AC
  Administered 2017-04-10: 2 via ORAL
  Filled 2017-04-10: qty 2

## 2017-04-10 MED ORDER — ONDANSETRON HCL 4 MG PO TABS: 4.0000 mg | ORAL_TABLET | Freq: Four times a day (QID) | ORAL | Status: DC | PRN

## 2017-04-10 MED ORDER — ACETAMINOPHEN 325 MG PO TABS
650.0000 mg | ORAL_TABLET | Freq: Four times a day (QID) | ORAL | Status: DC | PRN
Start: 2017-04-10 — End: 2017-04-13
  Administered 2017-04-10: 650 mg via ORAL
  Filled 2017-04-10: qty 2

## 2017-04-10 MED ORDER — SENNOSIDES-DOCUSATE SODIUM 8.6-50 MG PO TABS
1.0000 | ORAL_TABLET | Freq: Every evening | ORAL | Status: DC | PRN
  Administered 2017-04-11: 1 via ORAL
  Filled 2017-04-10: qty 1

## 2017-04-10 MED ORDER — SODIUM CHLORIDE 0.9% FLUSH
3.0000 mL | Freq: Two times a day (BID) | INTRAVENOUS | Status: DC
  Administered 2017-04-10 – 2017-04-13 (×6): 3 mL via INTRAVENOUS

## 2017-04-10 MED ORDER — CALCIUM ACETATE (PHOS BINDER) 667 MG PO CAPS
1334.0000 mg | ORAL_CAPSULE | Freq: Two times a day (BID) | ORAL | Status: DC
  Administered 2017-04-10 – 2017-04-13 (×6): 1334 mg via ORAL
  Filled 2017-04-10 (×6): qty 2

## 2017-04-10 NOTE — ED Triage Notes (Addendum)
PER EMS: pt from dialysis center (was completed), pt reporting left and right upper leg pain near her groin, onset Monday but worse last night. Denies abdominal pain. BP- 128/86, HR-96, 18RR, 96% 2L [place], 96 CBG. A&Ox4. Denies any other complaints.

## 2017-04-10 NOTE — ED Notes (Signed)
Patient transported to CT 
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
## 2017-04-10 NOTE — H&P (Signed)
History and Physical    [name] KDX:[id number] DOB: 01-Feb-1934 DOA: 04/10/2017  PCP: Dorothyann Peng, NP  Patient coming from: Home I have personally briefly reviewed patient's old medical records in [place]  Chief Complaint: Leg and lower abdominal pain  HPI: [name] is a 83 y.o. female with medical history significant of ESRD on HD (MWF) presents with complain of left leg pain and lower abdominal pain for the last 2-3 days. Her symptoms were sudden in onset, almost constant, progressively worsening to become severe, sharp in nature, aggravated by even minimal left leg movement and not relieved by anything. She denies any trauma, fall, excessive cough/sneezing. She complains of nausea but no vomiting, fever, bleeding from any orifices, chest pain, palpitation, loss of consciousness.  ED Course: CT scan showed Left iliopsoas hematoma. She was given iv analgesics and hospitalist service was called to admit the patient.  Review of Systems: As per HPI otherwise 10 point review of systems negative.   Past Medical History:  Diagnosis Date  . Anemia   . Anginal pain ([place])   . Aortic valve sclerosis   . Arthritis    "all over" (03/09/2014)  . Asthma    "used to; not anymore" (03/09/2014)  . Blood transfusion 1960's  . Chronic back pain   . ESRD (end stage renal disease) on dialysis ([place])    M, W, [place] dialysis (03/09/2014)  . Exertional shortness of breath   . Gangrene ([place])    left fifth toe  . GERD (gastroesophageal reflux disease)    hx (03/09/2014)  . Gout, unspecified 1980's   "not anymore" (03/09/2014)  . Heart murmur    "I think so" (11/25/2012)  . Hyperlipidemia   . Hypertension   . Mild mitral valve stenosis   . Other specified cardiac dysrhythmias(427.89)    sees Dr. Angelena Form  . Peripheral vascular disease ([place])   . Pneumonia    "once; years ago" (03/09/2014)  . Shoulder fracture, right 2012  . Type II diabetes mellitus ([place])    "not  on any medication at this time" (03/09/2014)    Past Surgical History:  Procedure Laterality Date  . APPENDECTOMY  1960's  . ARTERIOVENOUS GRAFT PLACEMENT Left    femoral loop arteriovenous Gore-Tex graft.  . AV FISTULA PLACEMENT  2008- 2013   "left upper arm; twice in my neck; left leg; removed from left leg; right upper arm" (11/25/2012)  . AV FISTULA PLACEMENT Right 03/09/2014   Procedure: RIGHT AXILLARY EXPLORATION; PARTIAL REMOVOAL OF OLD ARTERIOVENOUS (AV) GORE-TEX GRAFT; LIGATION OF RIGHT AXILLARY VEIN; ULTRASOUND GUIDED;  Surgeon: Conrad [place], MD;  Location: [facility];  Service: Vascular;  Laterality: Right;  . AV FISTULA REPAIR Bilateral    "right/left arm fistula failed; removed left thigh d/t poor circulation" (11/25/2012)  . CATARACT EXTRACTION, BILATERAL Bilateral   . COLONOSCOPY  2014?  . ESOPHAGOGASTRODUODENOSCOPY N/A 09/10/2013   Procedure: ESOPHAGOGASTRODUODENOSCOPY (EGD);  Surgeon: [name], MD;  Location: [name];  Service: Endoscopy;  Laterality: N/A;  . EXCHANGE OF A DIALYSIS CATHETER Right 06/11/2013   Procedure: EXCHANGE OF A DIALYSIS CATHETER;  Surgeon: Conrad [place], MD;  Location: [facility];  Service: Vascular;  Laterality: Right;  . EYE SURGERY    . FEMORAL-POPLITEAL BYPASS GRAFT  04/11/2012   Procedure: BYPASS GRAFT FEMORAL-POPLITEAL ARTERY;  Surgeon: [name], MD;  Location: [facility];  Service: Vascular;  Laterality: Left;  Thrombectomy/Left femoral-popliteal bypass with revision of proximal end  and shortening of graft; intraoperative arteriogram; endarterectomy and patch angioplasty with distal anastomosis  . FEMORAL-POPLITEAL BYPASS GRAFT  10/09/2012   Procedure: BYPASS GRAFT FEMORAL-POPLITEAL ARTERY;  Surgeon: [name], MD;  Location: [facility];  Service: Vascular;  Laterality: Left;  Redo left tibioperoneal trunk bypass with Gortex Graft 40mmx80cm.  Marland Kitchen FISTULOGRAM Right 06/11/2013   Procedure: Venogram with angioplasty;  Surgeon: Conrad Hurdland, MD;   Location: [facility];  Service: Vascular;  Laterality: Right;  RIGHT CENTRAL VENOGRAM WITH ANGIOPLASTY  . FOOT AMPUTATION THROUGH METATARSAL Left 06/22/11   "whole 5th toe" (11/25/2012)  . INSERTION OF DIALYSIS CATHETER Right 03/10/2014   Procedure: INSERTION OF TUNNELED  DIALYSIS CATHETER -attempted  RIGHT SUBCLAVIAN, RIGHT FEMORAL TUNNELED DIALYSIS CATHETER EXCHANGE with Inferior Vena Cava gram and Fibrin Sheath Angioplasty;  Surgeon: Conrad Goodell, MD;  Location: [facility];  Service: Vascular;  Laterality: Right;  . KYPHOPLASTY  11/25/2012   Procedure: KYPHOPLASTY;  Surgeon: Kristeen Miss, MD;  Location: [facility];  Service: Neurosurgery;  Laterality: N/A;  Lumbar two lumbar five Kyphoplasty  . PR VEIN BYPASS GRAFT,AORTO-FEM-POP  06/14/2011  . TOTAL ABDOMINAL HYSTERECTOMY  1960's   one ovary removed    Social History  reports that she has quit smoking. Her smoking use included Cigarettes. She has a 1.80 pack-year smoking history. She has never used smokeless tobacco. She reports that she does not drink alcohol or use drugs.  She lives at home with her family  Allergies  Allergen Reactions  . Ace Inhibitors Other (See Comments)    Reaction unknown    Family History  Problem Relation Age of Onset  . Peripheral vascular disease Mother     amputation  . Hypertension Mother   . Alcohol abuse Mother   . Arthritis Mother   . COPD Sister   . Heart attack Sister     Prior to Admission medications   Medication Sig Start Date End Date Taking? Authorizing Provider  amLODipine (NORVASC) 2.5 MG tablet TAKE 1 TABLET (2.5 MG TOTAL) BY MOUTH DAILY. 04/02/17  Yes Shian Spark, MD  aspirin 81 MG tablet Take 81 mg by mouth daily.   Yes Historical Provider, MD  calcium acetate (PHOSLO) 667 MG capsule Take 2 capsules by mouth 2 (two) times daily with a meal. 12/28/15  Yes Historical Provider, MD  cloNIDine (CATAPRES) 0.1 MG tablet TAKE 1 TABLET (0.1 MG TOTAL) BY MOUTH DAILY. Patient taking differently:  Take 0.1 mg by mouth at bedtime.  04/02/17  Yes Soledad Spark, MD  latanoprost (XALATAN) 0.005 % ophthalmic solution 1 drop at bedtime.   Yes Historical Provider, MD  pantoprazole (PROTONIX) 40 MG tablet Take 40 mg by mouth at bedtime as needed (occasional).    Yes Historical Provider, MD  metoprolol succinate (TOPROL-XL) 50 MG 24 hr tablet Take 50 mg by mouth daily. Take with or immediately following a meal. 04/15/12   Gabriel Earing, PA-C    Physical Exam: Vitals:   04/10/17 1215 04/10/17 1230 04/10/17 1245 04/10/17 1300  BP: (!) 155/85 (!) 172/117 (!) 161/71 (!) 175/99  Pulse: 85 83 87 86  Resp: 18 16 15  (!) 22  Temp:      TempSrc:      SpO2: 100% 100% 100% 99%    Constitutional: She is in mild distress due to pain. Answering questions; cooperative Vitals:   04/10/17 1215 04/10/17 1230 04/10/17 1245 04/10/17 1300  BP: (!) 155/85 (!) 172/117 (!) 161/71 (!) 175/99  Pulse: 85  83 87 86  Resp: 18 16 15  (!) 22  Temp:      TempSrc:      SpO2: 100% 100% 100% 99%   Eyes: PERRL, lids and conjunctivae normal ENMT: Mucous membranes are moist. Posterior pharynx clear of any exudate or lesions Neck: normal, supple, no masses, no thyromegaly Respiratory: clear to auscultation bilaterally, no wheezing, no crackles. Normal respiratory effort. No accessory muscle use.  Cardiovascular: Regular rate and rhythm, no murmurs / rubs / gallops.  2+ pedal pulses. No carotid bruits.  Abdomen: Mild left lower quadrant tenderness; no rebound tenderness. no masses palpated. No hepatosplenomegaly. Bowel sounds positive.  Musculoskeletal: no clubbing / cyanosis. Tenderness over the left hip and upper thigh. Decreased ROM of the left hip. Skin: no rashes, lesions, ulcers.  Neurologic: CN 2-12 grossly intact. Sensation intact; moving extremities Psychiatric: Normal judgment and insight. Alert and oriented x 3. Normal mood.    Labs on Admission: I have personally reviewed following labs and imaging  studies  CBC:  Recent Labs Lab 04/10/17 1330  WBC 7.6  NEUTROABS 6.6  HGB 10.1*  HCT 31.1*  MCV 96.0  PLT 330   Basic Metabolic Panel:  Recent Labs Lab 04/10/17 1330  NA 137  K 4.2  CL 96*  CO2 25  GLUCOSE 153*  BUN 16  CREATININE 3.92*  CALCIUM 9.4   GFR: CrCl cannot be calculated (Unknown ideal weight.). Liver Function Tests: No results for input(s): AST, ALT, ALKPHOS, BILITOT, PROT, ALBUMIN in the last 168 hours. No results for input(s): LIPASE, AMYLASE in the last 168 hours. No results for input(s): AMMONIA in the last 168 hours. Coagulation Profile: No results for input(s): INR, PROTIME in the last 168 hours. Cardiac Enzymes: No results for input(s): CKTOTAL, CKMB, CKMBINDEX, TROPONINI in the last 168 hours. BNP (last 3 results) No results for input(s): PROBNP in the last 8760 hours. HbA1C: No results for input(s): HGBA1C in the last 72 hours. CBG: No results for input(s): GLUCAP in the last 168 hours. Lipid Profile: No results for input(s): CHOL, HDL, LDLCALC, TRIG, CHOLHDL, LDLDIRECT in the last 72 hours. Thyroid Function Tests: No results for input(s): TSH, T4TOTAL, FREET4, T3FREE, THYROIDAB in the last 72 hours. Anemia Panel: No results for input(s): VITAMINB12, FOLATE, FERRITIN, TIBC, IRON, RETICCTPCT in the last 72 hours. Urine analysis: No results found for: COLORURINE, APPEARANCEUR, [place], [place], GLUCOSEU, HGBUR, BILIRUBINUR, KETONESUR, PROTEINUR, UROBILINOGEN, NITRITE, LEUKOCYTESUR  Radiological Exams on Admission: Ct Abdomen Pelvis W Contrast  Result Date: 04/10/2017 CLINICAL DATA:  LEFT lower quadrant pain, nausea and vomiting after dialysis today. History of diabetes, end-stage renal disease on dialysis, hypertension, appendectomy. EXAM: CT ABDOMEN AND PELVIS WITH CONTRAST TECHNIQUE: Multidetector CT imaging of the abdomen and pelvis was performed using the standard protocol following bolus administration of intravenous contrast. CONTRAST:   139mL ISOVUE-300 IOPAMIDOL (ISOVUE-300) INJECTION 61% COMPARISON:  CT abdomen and pelvis March 08, 2013 FINDINGS: LOWER CHEST: Lung bases are clear. Heart size is normal, dialysis catheter terminates in the cavoatrial junction. Severe coronary artery calcifications. HEPATOBILIARY: Patchy peripheral hyperenhancing peripheral aspect LEFT lobe of the liver and segment 4 with early washout most compatible with shunt. PANCREAS: Normal. SPLEEN: Normal. ADRENALS/URINARY TRACT: Severely atrophic kidneys. 15 mm RIGHT upper pole cyst. No nephrolithiasis, hydronephrosis or solid renal masses. The unopacified ureters are normal in course and caliber. Delayed imaging through the kidneys demonstrates symmetric prompt contrast excretion within the proximal urinary collecting system. Urinary bladder is decompressed and unremarkable. Normal adrenal glands. STOMACH/BOWEL: The stomach,  small and large bowel are normal in course and caliber without inflammatory changes. VASCULAR/LYMPHATIC: Aortoiliac vessels are normal in course and caliber, severe calcific atherosclerosis. Small inferior vena cava most compatible with chronic nonocclusive thrombosis gait and prior catheter placement. LEFT femoral bypass, with similar poor contrast opacification concerning for chronic occlusion. No lymphadenopathy by CT size criteria. REPRODUCTIVE: Status post hysterectomy. OTHER: No intraperitoneal free fluid or free air. MUSCULOSKELETAL: Nonacute. Coarse calcifications in the bilateral breasts. Partially calcified 14 mm RIGHT breast mass. LEFT femoral popliteal bypass. Heterogeneous contrast opacification of the external iliac vein/femoral vein compared to the RIGHT. LEFT anterior compartment muscle enlargement and fat stranding. Osteopenia. Old L2 compression fracture, status post cement augmentation, severe old L5 compression fracture and cement augmentation. Mild old L4 compression fracture. Anterior abdominal wall scarring. IMPRESSION: Bulky LEFT  ileal psoas muscle LEFT iliopsoas muscle enlargement with surrounding fat stranding concerning for intramuscular hemorrhage. Findings less likely due to deep vein thrombosis or myositis. Chronic partial occlusion of the IVC. Hyperenhancing in liver is most compatible with arterioportal shunt. 14 mm partially calcified RIGHT breast mass, possible intramammary lymph node. Recommend correlation with mammography if not recently performed. Severe atherosclerosis. Acute findings discussed with and reconfirmed by PA ROBERT BROWNING on 04/10/2017 at 3:20 pm. Electronically Signed   By: Elon Alas M.D.   On: 04/10/2017 15:22    Assessment/Plan Active Problems:   Iliopsoas muscle hematoma   RENAL FAILURE, END STAGE  1. Probable Left iliopsoas muscle hematoma: questionable cause; patient not on anticoagulation; no history of trauma/falls.  -hold aspirin -pain management -PT eval -Patient might need f/u repeat CT as an outpatient in 1-2 wks time to check for resolution  2. ESRD on HD: patient had HD today. Spoke to Dr. Mattingly/Nephrology on phone for consult. Repeat labs in AM  3. HTN: BP on the higher side; monitor BP; continue home antihypertensives  4. Anemia: probably anemia of chronic disease from renal failure and from iliopsoas hematoma; Hb stable; repeat Hb in AM  DVT prophylaxis:  SCDs; avoid heparin due to hematoma Code Status: Full Family Communication:  No family present at bedside. Disposition Plan:  Home in 1-2 days; might need home care Consults called:  Dr. Mercy Moore (Nephrology) Admission status: Inpatient  Aline August MD [facility] Pager [phone] If 7PM-7AM, please contact night-coverage [email or web address] Password [facility]  04/10/2017, 4:26 PM

## 2017-04-11 ENCOUNTER — Inpatient Hospital Stay (HOSPITAL_COMMUNITY): Payer: Medicare Other

## 2017-04-11 DIAGNOSIS — M79609 Pain in unspecified limb: Secondary | ICD-10-CM

## 2017-04-11 LAB — CBC
HEMATOCRIT: 28.2 % — AB (ref 36.0–46.0)
HEMOGLOBIN: 9.1 g/dL — AB (ref 12.0–15.0)
MCH: 31.4 pg (ref 26.0–34.0)
MCHC: 32.3 g/dL (ref 30.0–36.0)
MCV: 97.2 fL (ref 78.0–100.0)
Platelets: 182 10*3/uL (ref 150–400)
RBC: 2.9 MIL/uL — AB (ref 3.87–5.11)
RDW: 16.3 % — ABNORMAL HIGH (ref 11.5–15.5)
WBC: 6.4 10*3/uL (ref 4.0–10.5)

## 2017-04-11 LAB — COMPREHENSIVE METABOLIC PANEL
ALBUMIN: 3.8 g/dL (ref 3.5–5.0)
ALT: 14 U/L (ref 14–54)
AST: 22 U/L (ref 15–41)
Alkaline Phosphatase: 82 U/L (ref 38–126)
Anion gap: 14 (ref 5–15)
BUN: 27 mg/dL — AB (ref 6–20)
CHLORIDE: 96 mmol/L — AB (ref 101–111)
CO2: 24 mmol/L (ref 22–32)
CREATININE: 5.51 mg/dL — AB (ref 0.44–1.00)
Calcium: 10.2 mg/dL (ref 8.9–10.3)
GFR calc Af Amer: 8 mL/min — ABNORMAL LOW (ref >60)
GFR calc non Af Amer: 7 mL/min — ABNORMAL LOW (ref >60)
Glucose, Bld: 116 mg/dL — ABNORMAL HIGH (ref 65–99)
POTASSIUM: 5.5 mmol/L — AB (ref 3.5–5.1)
SODIUM: 134 mmol/L — AB (ref 135–145)
Total Bilirubin: 0.5 mg/dL (ref 0.3–1.2)
Total Protein: 7.6 g/dL (ref 6.5–8.1)

## 2017-04-11 LAB — PROTIME-INR
INR: 1.13
PROTHROMBIN TIME: 14.5 s (ref 11.4–15.2)

## 2017-04-11 LAB — MRSA PCR SCREENING: MRSA by PCR: NEGATIVE

## 2017-04-11 MED ORDER — AMLODIPINE BESYLATE 5 MG PO TABS
2.5000 mg | ORAL_TABLET | Freq: Every day | ORAL | Status: DC
Start: 2017-04-11 — End: 2017-04-13
  Filled 2017-04-11 (×2): qty 1

## 2017-04-11 MED ORDER — PANTOPRAZOLE SODIUM 40 MG PO TBEC
40.0000 mg | DELAYED_RELEASE_TABLET | Freq: Once | ORAL | Status: AC
  Administered 2017-04-11: 40 mg via ORAL
  Filled 2017-04-11: qty 1

## 2017-04-11 MED ORDER — METOCLOPRAMIDE HCL 5 MG/ML IJ SOLN
10.0000 mg | Freq: Four times a day (QID) | INTRAMUSCULAR | Status: DC | PRN
  Administered 2017-04-11: 10 mg via INTRAVENOUS
  Filled 2017-04-11: qty 2

## 2017-04-11 NOTE — Progress Notes (Signed)
*  PRELIMINARY RESULTS* Vascular Ultrasound Left lower extremity venous duplex has been completed.  Preliminary findings: Technically difficult to image groin/ thigh due to shadowing from bypass graft. No obvious evidence of DVT or baker's cyst.    Landry Mellow, RDMS, RVT   04/11/2017, 10:50 AM

## 2017-04-11 NOTE — Consult Note (Signed)
[facility] Renal Consultation Note    Indication for Consultation:  Management of ESRD/hemodialysis; anemia, hypertension/volume and secondary hyperparathyroidism PCP: Dorothyann Peng, NP - [facility]  HPI: [name] is a 83 y.o. female with ESRD presumed secondary to HTN on dialysis almost 10 years who dialyzes MWF at [facility].  She reports the onset of left abdominal/groin pain Tuesday that got worse Wednesday while she was at dialysis.  She actually reported it Monday at dialysis to the rounding provider and described it as being in the left thigh and said she had been to her PCP. She denies cough, fall. SOB, fever or chills or new wounds.  She has had N and V since coming to the hospital but not before. The vomiting is not correlated with timing of pain meds.  Evaluation in the ED showed left iliopsoas hematoma on CT, partially occluded IVC, 14 mm partially calcified right breast mass. hgb was 10.1 and 9.1 today  (last outpt hgb was 10.9 4/4) K was 4.2 yesterday and today 5.5. Ca 10.2.   She only takes norvasc 2.5 and clonidine 0.1 for BP control. She has not been on metoprolol for several years.   Past Medical History:  Diagnosis Date  . Anemia   . Anginal pain ([place])   . Aortic valve sclerosis   . Arthritis    "all over" (03/09/2014)  . Asthma    "used to; not anymore" (03/09/2014)  . Blood transfusion 1960's  . Chronic back pain   . ESRD (end stage renal disease) on dialysis ([place])    "M, [address], [address]  (04/10/2017)  . Exertional shortness of breath   . Gangrene ([place])    left fifth toe  . GERD (gastroesophageal reflux disease)    hx (03/09/2014)  . Gout, unspecified 1980's   "not anymore" (03/09/2014)  . Heart murmur    "I think so" (11/25/2012)  . Hyperlipidemia   . Hypertension   . Iliopsoas muscle hematoma 04/10/2017  . Mild mitral valve stenosis   . Other specified cardiac dysrhythmias(427.89)    sees Dr. Angelena Form  . Peripheral vascular  disease ([place])   . Pneumonia    "once; years ago" (03/09/2014)  . Shoulder fracture, right 2012  . Type II diabetes mellitus ([place])    "not on any medication at this time" (03/09/2014)   Past Surgical History:  Procedure Laterality Date  . APPENDECTOMY  1960's  . ARTERIOVENOUS GRAFT PLACEMENT Left    femoral loop arteriovenous Gore-Tex graft.  . AV FISTULA PLACEMENT  2008- 2013   "left upper arm; twice in my neck; left leg; removed from left leg; right upper arm" (11/25/2012)  . AV FISTULA PLACEMENT Right 03/09/2014   Procedure: RIGHT AXILLARY EXPLORATION; PARTIAL REMOVOAL OF OLD ARTERIOVENOUS (AV) GORE-TEX GRAFT; LIGATION OF RIGHT AXILLARY VEIN; ULTRASOUND GUIDED;  Surgeon: Conrad Elwood, MD;  Location: [place];  Service: Vascular;  Laterality: Right;  . AV FISTULA REPAIR Bilateral    "right/left arm fistula failed; removed left thigh d/t poor circulation" (11/25/2012)  . CATARACT EXTRACTION, BILATERAL Bilateral   . COLONOSCOPY  2014?  . ESOPHAGOGASTRODUODENOSCOPY N/A 09/10/2013   Procedure: ESOPHAGOGASTRODUODENOSCOPY (EGD);  Surgeon: [name], MD;  Location: [name];  Service: Endoscopy;  Laterality: N/A;  . EXCHANGE OF A DIALYSIS CATHETER Right 06/11/2013   Procedure: EXCHANGE OF A DIALYSIS CATHETER;  Surgeon: Conrad Helena, MD;  Location: [place];  Service: Vascular;  Laterality: Right;  . EYE SURGERY    .  FEMORAL-POPLITEAL BYPASS GRAFT  04/11/2012   Procedure: BYPASS GRAFT FEMORAL-POPLITEAL ARTERY;  Surgeon: [name], MD;  Location: [place];  Service: Vascular;  Laterality: Left;  Thrombectomy/Left femoral-popliteal bypass with revision of proximal end and shortening of graft; intraoperative arteriogram; endarterectomy and patch angioplasty with distal anastomosis  . FEMORAL-POPLITEAL BYPASS GRAFT  10/09/2012   Procedure: BYPASS GRAFT FEMORAL-POPLITEAL ARTERY;  Surgeon: [name], MD;  Location: [place];  Service: Vascular;  Laterality: Left;  Redo left tibioperoneal trunk  bypass with Gortex Graft 85mmx80cm.  Marland Kitchen FISTULOGRAM Right 06/11/2013   Procedure: Venogram with angioplasty;  Surgeon: Conrad Decker, MD;  Location: [place];  Service: Vascular;  Laterality: Right;  RIGHT CENTRAL VENOGRAM WITH ANGIOPLASTY  . FOOT AMPUTATION THROUGH METATARSAL Left 06/22/11   "whole 5th toe" (11/25/2012)  . INSERTION OF DIALYSIS CATHETER Right 03/10/2014   Procedure: INSERTION OF TUNNELED  DIALYSIS CATHETER -attempted  RIGHT SUBCLAVIAN, RIGHT FEMORAL TUNNELED DIALYSIS CATHETER EXCHANGE with Inferior Vena Cava gram and Fibrin Sheath Angioplasty;  Surgeon: Conrad Jasper, MD;  Location: [place];  Service: Vascular;  Laterality: Right;  . KYPHOPLASTY  11/25/2012   Procedure: KYPHOPLASTY;  Surgeon: Kristeen Miss, MD;  Location: [facility];  Service: Neurosurgery;  Laterality: N/A;  Lumbar two lumbar five Kyphoplasty  . PR VEIN BYPASS GRAFT,AORTO-FEM-POP  06/14/2011  . TOTAL ABDOMINAL HYSTERECTOMY  1960's   one ovary removed   Family History  Problem Relation Age of Onset  . Peripheral vascular disease Mother     amputation  . Hypertension Mother   . Alcohol abuse Mother   . Arthritis Mother   . COPD Sister   . Heart attack Sister    Social History:  reports that she has quit smoking. Her smoking use included Cigarettes. She has a 1.80 pack-year smoking history. She has never used smokeless tobacco. She reports that she does not drink alcohol or use drugs. Allergies  Allergen Reactions  . Ace Inhibitors Other (See Comments)    Reaction unknown   Prior to Admission medications   Medication Sig Start Date End Date Taking? Authorizing Provider  amLODipine (NORVASC) 2.5 MG tablet TAKE 1 TABLET (2.5 MG TOTAL) BY MOUTH DAILY. 04/02/17  Yes Zaneta Spark, MD  aspirin 81 MG tablet Take 81 mg by mouth daily.   Yes Historical Provider, MD  calcium acetate (PHOSLO) 667 MG capsule Take 2 capsules by mouth 2 (two) times daily with a meal. 12/28/15  Yes Historical Provider, MD  cloNIDine  (CATAPRES) 0.1 MG tablet TAKE 1 TABLET (0.1 MG TOTAL) BY MOUTH DAILY. Patient taking differently: Take 0.1 mg by mouth at bedtime.  04/02/17  Yes Lasean Spark, MD  latanoprost (XALATAN) 0.005 % ophthalmic solution 1 drop at bedtime.   Yes Historical Provider, MD  pantoprazole (PROTONIX) 40 MG tablet Take 40 mg by mouth at bedtime as needed (occasional).    Yes Historical Provider, MD  metoprolol succinate (TOPROL-XL) 50 MG 24 hr tablet Take 50 mg by mouth daily. Take with or immediately following a meal. 04/15/12   Hulen Shouts Rhyne, PA-C   Current Facility-Administered Medications  Medication Dose Route Frequency Provider Last Rate Last Dose  . acetaminophen (TYLENOL) tablet 650 mg  650 mg Oral Q6H PRN Aline August, MD   650 mg at 04/10/17 1845   Or  . acetaminophen (TYLENOL) suppository 650 mg  650 mg Rectal Q6H PRN Kshitiz Alekh, MD      . amLODipine (NORVASC) tablet 2.5 mg  2.5 mg  Oral Daily Aline August, MD   Stopped at 04/11/17 [phone]  . calcium acetate (PHOSLO) capsule 1,334 mg  1,334 mg Oral BID WC Aline August, MD   1,334 mg at 04/11/17 0835  . cloNIDine (CATAPRES) tablet 0.1 mg  0.1 mg Oral QHS Kshitiz Alekh, MD   0.1 mg at 04/10/17 2210  . latanoprost (XALATAN) 0.005 % ophthalmic solution 1 drop  1 drop Both Eyes QHS Aline August, MD   1 drop at 04/10/17 2213  . metoprolol succinate (TOPROL-XL) 24 hr tablet 50 mg  50 mg Oral Daily Aline August, MD   50 mg at 04/10/17 1848  . morphine 4 MG/ML injection 2 mg  2 mg Intravenous Q4H PRN Aline August, MD   2 mg at 04/10/17 1714  . ondansetron (ZOFRAN) tablet 4 mg  4 mg Oral Q6H PRN Kshitiz Alekh, MD       Or  . ondansetron (ZOFRAN) injection 4 mg  4 mg Intravenous Q6H PRN Aline August, MD   4 mg at 04/11/17 0835  . oxyCODONE (Oxy IR/ROXICODONE) immediate release tablet 5 mg  5 mg Oral Q4H PRN Aline August, MD   5 mg at 04/10/17 1845  . pantoprazole (PROTONIX) EC tablet 40 mg  40 mg Oral QHS PRN Kshitiz Alekh, MD      .  senna-docusate (Senokot-S) tablet 1 tablet  1 tablet Oral QHS PRN Kshitiz Alekh, MD      . sodium chloride flush (NS) 0.9 % injection 3 mL  3 mL Intravenous Q12H Kshitiz Alekh, MD   3 mL at 04/10/17 2214   Labs: Basic Metabolic Panel:  Recent Labs Lab 04/10/17 1330 04/11/17 0405  NA 137 134*  K 4.2 5.5*  CL 96* 96*  CO2 25 24  GLUCOSE 153* 116*  BUN 16 27*  CREATININE 3.92* 5.51*  CALCIUM 9.4 10.2   Liver Function Tests:  Recent Labs Lab 04/11/17 0405  AST 22  ALT 14  ALKPHOS 82  BILITOT 0.5  PROT 7.6  ALBUMIN 3.8   CBC:  Recent Labs Lab 04/10/17 1330 04/11/17 0405  WBC 7.6 6.4  NEUTROABS 6.6  --   HGB 10.1* 9.1*  HCT 31.1* 28.2*  MCV 96.0 97.2  PLT 193 182   Studies/Results: Ct Abdomen Pelvis W Contrast  Result Date: 04/10/2017 CLINICAL DATA:  LEFT lower quadrant pain, nausea and vomiting after dialysis today. History of diabetes, end-stage renal disease on dialysis, hypertension, appendectomy. EXAM: CT ABDOMEN AND PELVIS WITH CONTRAST TECHNIQUE: Multidetector CT imaging of the abdomen and pelvis was performed using the standard protocol following bolus administration of intravenous contrast. CONTRAST:  154mL ISOVUE-300 IOPAMIDOL (ISOVUE-300) INJECTION 61% COMPARISON:  CT abdomen and pelvis March 08, 2013 FINDINGS: LOWER CHEST: Lung bases are clear. Heart size is normal, dialysis catheter terminates in the cavoatrial junction. Severe coronary artery calcifications. HEPATOBILIARY: Patchy peripheral hyperenhancing peripheral aspect LEFT lobe of the liver and segment 4 with early washout most compatible with shunt. PANCREAS: Normal. SPLEEN: Normal. ADRENALS/URINARY TRACT: Severely atrophic kidneys. 15 mm RIGHT upper pole cyst. No nephrolithiasis, hydronephrosis or solid renal masses. The unopacified ureters are normal in course and caliber. Delayed imaging through the kidneys demonstrates symmetric prompt contrast excretion within the proximal urinary collecting system.  Urinary bladder is decompressed and unremarkable. Normal adrenal glands. STOMACH/BOWEL: The stomach, small and large bowel are normal in course and caliber without inflammatory changes. VASCULAR/LYMPHATIC: Aortoiliac vessels are normal in course and caliber, severe calcific atherosclerosis. Small inferior vena cava most compatible with chronic  nonocclusive thrombosis gait and prior catheter placement. LEFT femoral bypass, with similar poor contrast opacification concerning for chronic occlusion. No lymphadenopathy by CT size criteria. REPRODUCTIVE: Status post hysterectomy. OTHER: No intraperitoneal free fluid or free air. MUSCULOSKELETAL: Nonacute. Coarse calcifications in the bilateral breasts. Partially calcified 14 mm RIGHT breast mass. LEFT femoral popliteal bypass. Heterogeneous contrast opacification of the external iliac vein/femoral vein compared to the RIGHT. LEFT anterior compartment muscle enlargement and fat stranding. Osteopenia. Old L2 compression fracture, status post cement augmentation, severe old L5 compression fracture and cement augmentation. Mild old L4 compression fracture. Anterior abdominal wall scarring. IMPRESSION: Bulky LEFT ileal psoas muscle LEFT iliopsoas muscle enlargement with surrounding fat stranding concerning for intramuscular hemorrhage. Findings less likely due to deep vein thrombosis or myositis. Chronic partial occlusion of the IVC. Hyperenhancing in liver is most compatible with arterioportal shunt. 14 mm partially calcified RIGHT breast mass, possible intramammary lymph node. Recommend correlation with mammography if not recently performed. Severe atherosclerosis. Acute findings discussed with and reconfirmed by PA ROBERT BROWNING on 04/10/2017 at 3:20 pm. Electronically Signed   By: Elon Alas M.D.   On: 04/10/2017 15:22    ROS: As per HPI otherwise negative.  Physical Exam: Vitals:   04/10/17 1656 04/10/17 2103 04/11/17 0506 04/11/17 0953  BP: (!) 157/75 (!)  125/37 (!) 111/57 (!) 107/39  Pulse: 99 84 65 68  Resp: 20 20 19 18   Temp:  98.3 F (36.8 C) 97.9 F (36.6 C) 98 F (36.7 C)  TempSrc:  Oral Oral Oral  SpO2: 100% 100% 100% 99%     General: WDWN AAF NAD doing word puzzles Head: NCAT sclera not icteric MMM Neck: Supple.  Lungs: CTA bilaterally without wheezes, rales, or rhonchi. Breathing is unlabored. Heart: RRR with S1 S2.  Abdomen: soft + BS left groin tenderness Lower extremities:without edema  Neuro: A & O  X 3. Moves all extremities spontaneously. Psych:  Responds to questions appropriately with a usual affect. Dialysis Access: right IJ  Dialysis Orders:  MWF 3.5 hr 400/800 EDW 57 2 K 2Ca profile 4 right IJ heparin 7000 with 3000 mid  Micera 75 q 4 weeks - last 3/26 Hectorol 5 just ^ from 3) no Fe Recent labs: hgb 10.9 4/4 increasing 48% sat iPTh 921 - no sensipar - causes GI sx  Assessment/Plan: 1. Left spontaneous iliopsoas hematoma - hold asa and heparin for now 2. Nausea and vomiting - at home sometimes takes a medicine that start with P at bedtime which is likely protonix; did not get prn dose ordered last night - will give one dose now and continue with prn dose - also on zofran 3. ESRD -  MWF - HD no heparin; this may cause access issue - typically requires a lot of heparin to help with catheter function K high today at 5.5 but not worried about it because of vomiting 4. Hypertension/volume  - not sure how she got below EDW yesterday net UF 2.5 with post wt 56.6 ; continue current meds; dialysis records indicate she is only on 2.5 norvasc and clonidine 0.1 Will d/c metoprolol 5. Anemia  - hgb decline likely due to hematoma - resume ESA Friday at 60 Aranesp per week; prior Fe was ok 6. Metabolic bone disease -  Calcium up - hectorol recently ^ to 5 from 3 - will reduce to 4; on phoslo - intolerant of sensipar 7. Nutrition - intake precluded by N and V today 8. 14 mm partially calcified right breast  mass - las mammogram was  05/2014 after fall with left breast hematoma; no evidence of breast cancer at that time; she would consider repeat mammogram in the future- can address as an [place], PA-C [employer] [phone] 04/11/2017, 10:14 AM    I have seen and examined this patient and agree with plan per Amalia Hailey.  Sitting up working a puzzle.  No nausea now.  Discussed possibility of going home in AM and she seemed disappointed "I like the hospital".  Will plan HD in AM then ? DC. Deziree Mokry T,MD 04/11/2017 11:31 AM

## 2017-04-11 NOTE — Care Management Note (Addendum)
Case Management Note  Patient Details  Name: [name] MRN: [id number] Date of Birth: 1934/04/04  Subjective/Objective:    CM following for progression and d/c planning.     Pt lives with granddaughter and has [facility] for transportation to HD, also has a walker and wheelchair, she states that SCAT drives comes to her porch and assist her to the [place].             Action/Plan: 04/11/2017 Await PT/OT recommendations.   Expected Discharge Date:                  Expected Discharge Plan:  [place]  In-House Referral:  NA  Discharge planning Services  CM Consult  Post Acute Care Choice:  Durable Medical Equipment Choice offered to:  Patient  DME Arranged:    DME Agency:     HH Arranged:  PT, OT [place] Agency:     Status of Service:  In process, will continue to follow  If discussed at Long Length of Stay Meetings, dates discussed:    Additional Comments:  Adron Bene, RN 04/11/2017, 11:36 AM

## 2017-04-11 NOTE — Consult Note (Signed)
           [facility] CM Primary Care Navigator  04/11/2017  [name] 1934/02/02 [id number]    Went to see patient in the room to identify possible discharge needs but she was fast asleep. No family members seen in the room at this time.  Will attempt to meet with patient at another time when she is available.  For questions, please contact:   Dannielle Huh, BSN, RN- [facility] Primary Care Navigator  Telephone: [phone] [name]

## 2017-04-11 NOTE — Evaluation (Signed)
Physical Therapy Evaluation Patient Details Name: [name] MRN: [id number] DOB: October 24, 1934 Today's Date: 04/11/2017   History of Present Illness   [name] is a 83 y.o. female with medical history significant of ESRD on HD (MWF) presents with complain of left leg pain and lower abdominal pain for the last 2-3 days.  CT scan shows L iliopsoas muscle hematoma.  Clinical Impression  Pt admitted with/for pain in L LE determine to be iliopsoas hematoma.  Pt needing at least min assist for all basic mobility due to pain and dysfunction..  Pt currently limited functionally due to the problems listed below.  (see problems list.)  Pt will benefit from PT to maximize function and safety to be able to get home safely with available assist.     Follow Up Recommendations Home health PT;Supervision for mobility/OOB    Equipment Recommendations       Recommendations for Other Services       Precautions / Restrictions Precautions Precautions: Fall      Mobility  Bed Mobility Overal bed mobility: Needs Assistance Bed Mobility: Supine to Sit;Sit to Supine     Supine to sit: Mod assist Sit to supine: Max assist   General bed mobility comments: need for both trunk and L leg assist  Transfers Overall transfer level: Needs assistance   Transfers: Sit to/from Stand Sit to Stand: Min assist         General transfer comment: stability assist, cues for safe hand placement  Ambulation/Gait Ambulation/Gait assistance: Min assist Ambulation Distance (Feet): 18 Feet Assistive device: Rolling walker (2 wheeled) Gait Pattern/deviations: Step-through pattern   Gait velocity interpretation: Below normal speed for age/gender General Gait Details: general mild unsteadiness in RW, flexed posture. 1 episode LOB  Financial trader Rankin (Stroke Patients Only)       Balance Overall balance assessment: Needs assistance Sitting-balance  support: No upper extremity supported;Bilateral upper extremity supported Sitting balance-Leahy Scale: Fair       Standing balance-Leahy Scale: Poor Standing balance comment: needs AD for safety                             Pertinent Vitals/Pain Pain Assessment: Faces Pain Score: 6  Faces Pain Scale: Hurts even more Pain Location: L groin Pain Descriptors / Indicators: Grimacing;Guarding;Sore Pain Intervention(s): Limited activity within patient's tolerance;Monitored during session;Repositioned    Home Living Family/patient expects to be discharged to:: Private residence Living Arrangements: Other relatives;Other (Comment) (grand daughter and 80 y/o great grand daughter.) Available Help at Discharge: Available PRN/intermittently;Family Type of Home: Apartment Home Access: Stairs to enter     Home Layout: One level Home Equipment: Environmental consultant - 4 wheels;Wheelchair - manual      Prior Function Level of Independence: Needs assistance   Gait / Transfers Assistance Needed: uses rollator to mobilize in/out of home, h/o falls           Hand Dominance        Extremity/Trunk Assessment        Lower Extremity Assessment Lower Extremity Assessment: RLE deficits/detail;LLE deficits/detail RLE Deficits / Details: functional, general weakness LLE Deficits / Details: greater than 3/5 except hip flexion 2/5 and painful LLE Coordination: decreased fine motor    Cervical / Trunk Assessment Cervical / Trunk Assessment: Kyphotic (general truncal weakness)  Communication   Communication: No difficulties  Cognition Arousal/Alertness:  Awake/alert Behavior During Therapy: WFL for tasks assessed/performed Overall Cognitive Status: Within Functional Limits for tasks assessed                                        General Comments      Exercises     Assessment/Plan    PT Assessment Patient needs continued PT services  PT Problem List Decreased  strength;Decreased activity tolerance;Decreased balance;Decreased mobility;Decreased knowledge of use of DME;Pain       PT Treatment Interventions DME instruction;Gait training;Functional mobility training;Therapeutic activities;Balance training;Patient/family education    PT Goals (Current goals can be found in the Care Plan section)  Acute Rehab PT Goals Patient Stated Goal: less pain so I can walk better. PT Goal Formulation: With patient Time For Goal Achievement: 04/25/17 Potential to Achieve Goals: Good    Frequency Min 3X/week   Barriers to discharge Other (comment) (pt is left without help and care of GGdtr at times)      Co-evaluation               End of Session   Activity Tolerance: Patient tolerated treatment well;Patient limited by pain Patient left: in bed;with call bell/phone within reach Nurse Communication: Mobility status PT Visit Diagnosis: Unsteadiness on feet (R26.81);Other abnormalities of gait and mobility (R26.89);History of falling (Z91.81);Pain Pain - Right/Left: Left Pain - part of body: Leg (groin)    Time: 2330-0762 PT Time Calculation (min) (ACUTE ONLY): 29 min   Charges:   PT Evaluation $PT Eval Moderate Complexity: 1 Procedure PT Treatments $Gait Training: 8-22 mins   PT G Codes:        02-May-2017  Donnella Sham, PT [phone]  (pager)  Tessie Fass Kaylamarie Swickard 05-02-2017, 3:33 PM

## 2017-04-11 NOTE — Progress Notes (Signed)
Patient ID: [name], female   DOB: March 10, 1934, 83 y.o.   MRN: [id number]  PROGRESS NOTE    CHARLISE GIOVANETTI  JQG:[id number] DOB: 08-Oct-1934 DOA: 04/10/2017 PCP: Dorothyann Peng, NP   Brief Narrative:  83 y.o. female with medical history significant of ESRD on HD (MWF) presented with complain of left leg pain and lower abdominal pain for the last 2-3 days. She was found to have left iliopsoas hematoma. Her pain is improving.  Assessment & Plan:   Active Problems:   Iliopsoas muscle hematoma   HTN (hypertension)   RENAL FAILURE, END STAGE  1. Probable Left iliopsoas muscle hematoma: questionable cause; patient not on anticoagulation; no history of trauma/falls.  -hold aspirin -pain management: pain improving with current meds. -PT eval -Patient might need f/u repeat CT as an outpatient in 1-2 wks time to check for resolution  2. ESRD on HD: patient had HD yesterday. Spoke to Dr. Mattingly/Nephrology about patient; will follow his recommendations.  Repeat labs in AM.   3. HTN: BP improved; monitor BP; continue current antihypertensives; Amlodipine and Clonidine. Metoprolol has been discontinued by nephrology.   4. Anemia: probably anemia of chronic disease from renal failure and from iliopsoas hematoma; Hb stable; repeat Hb in AM   5. 54mm partially calcified right breast mass: probable inflammatory lymph node; outpatient follow up with PCP for need of repeat Mammography   DVT prophylaxis:  SCDs; avoid heparin due to hematoma Code Status: Full Family Communication:  No family present at bedside. Disposition Plan:  Home in 1-2 days; might need home care Consultants:   Dr. Mercy Moore (Nephrology)  Procedures: None Antimicrobials: None  Subjective: She feels better. Her pain is better controlled. No overnight fever, vomiting or diarrhea.  Objective: Vitals:   04/10/17 1656 04/10/17 2103 04/11/17 0506 04/11/17 0953  BP: (!) 157/75 (!) 125/37 (!) 111/57 (!) 107/39  Pulse:  99 84 65 68  Resp: 20 20 19 18   Temp:  98.3 F (36.8 C) 97.9 F (36.6 C) 98 F (36.7 C)  TempSrc:  Oral Oral Oral  SpO2: 100% 100% 100% 99%    Intake/Output Summary (Last 24 hours) at 04/11/17 1112 Last data filed at 04/11/17 0900  Gross per 24 hour  Intake              120 ml  Output              200 ml  Net              -80 ml   There were no vitals filed for this visit.  Examination:  General exam: Appears calm and comfortable  Respiratory system: Clear to auscultation. Respiratory effort normal. Cardiovascular system: S1 & S2 heard, rate controlled. Gastrointestinal system: Abdomen is nondistended, soft and nontender. No organomegaly or masses felt. Normal bowel sounds heard. Central nervous system: Alert and oriented. No focal neurological deficits. Extremities: no clubbing / cyanosis. Tenderness over the left hip and upper thigh. Decreased ROM of the left hip   Data Reviewed: I have personally reviewed following labs and imaging studies  CBC:  Recent Labs Lab 04/10/17 1330 04/11/17 0405  WBC 7.6 6.4  NEUTROABS 6.6  --   HGB 10.1* 9.1*  HCT 31.1* 28.2*  MCV 96.0 97.2  PLT 193 197   Basic Metabolic Panel:  Recent Labs Lab 04/10/17 1330 04/11/17 0405  NA 137 134*  K 4.2 5.5*  CL 96* 96*  CO2 25 24  GLUCOSE 153* 116*  BUN  16 27*  CREATININE 3.92* 5.51*  CALCIUM 9.4 10.2   GFR: CrCl cannot be calculated (Unknown ideal weight.). Liver Function Tests:  Recent Labs Lab 04/11/17 0405  AST 22  ALT 14  ALKPHOS 82  BILITOT 0.5  PROT 7.6  ALBUMIN 3.8   No results for input(s): LIPASE, AMYLASE in the last 168 hours. No results for input(s): AMMONIA in the last 168 hours. Coagulation Profile:  Recent Labs Lab 04/11/17 0405  INR 1.13   Cardiac Enzymes: No results for input(s): CKTOTAL, CKMB, CKMBINDEX, TROPONINI in the last 168 hours. BNP (last 3 results) No results for input(s): PROBNP in the last 8760 hours. HbA1C: No results for  input(s): HGBA1C in the last 72 hours. CBG: No results for input(s): GLUCAP in the last 168 hours. Lipid Profile: No results for input(s): CHOL, HDL, LDLCALC, TRIG, CHOLHDL, LDLDIRECT in the last 72 hours. Thyroid Function Tests: No results for input(s): TSH, T4TOTAL, FREET4, T3FREE, THYROIDAB in the last 72 hours. Anemia Panel: No results for input(s): VITAMINB12, FOLATE, FERRITIN, TIBC, IRON, RETICCTPCT in the last 72 hours. Sepsis Labs: No results for input(s): PROCALCITON, LATICACIDVEN in the last 168 hours.  Recent Results (from the past 240 hour(s))  MRSA PCR Screening     Status: None   Collection Time: 04/11/17  4:41 AM  Result Value Ref Range Status   MRSA by PCR NEGATIVE NEGATIVE Final    Comment:        The GeneXpert MRSA Assay (FDA approved for NASAL specimens only), is one component of a comprehensive MRSA colonization surveillance program. It is not intended to diagnose MRSA infection nor to guide or monitor treatment for MRSA infections.          Radiology Studies: Ct Abdomen Pelvis W Contrast  Result Date: 04/10/2017 CLINICAL DATA:  LEFT lower quadrant pain, nausea and vomiting after dialysis today. History of diabetes, end-stage renal disease on dialysis, hypertension, appendectomy. EXAM: CT ABDOMEN AND PELVIS WITH CONTRAST TECHNIQUE: Multidetector CT imaging of the abdomen and pelvis was performed using the standard protocol following bolus administration of intravenous contrast. CONTRAST:  138mL ISOVUE-300 IOPAMIDOL (ISOVUE-300) INJECTION 61% COMPARISON:  CT abdomen and pelvis March 08, 2013 FINDINGS: LOWER CHEST: Lung bases are clear. Heart size is normal, dialysis catheter terminates in the cavoatrial junction. Severe coronary artery calcifications. HEPATOBILIARY: Patchy peripheral hyperenhancing peripheral aspect LEFT lobe of the liver and segment 4 with early washout most compatible with shunt. PANCREAS: Normal. SPLEEN: Normal. ADRENALS/URINARY TRACT:  Severely atrophic kidneys. 15 mm RIGHT upper pole cyst. No nephrolithiasis, hydronephrosis or solid renal masses. The unopacified ureters are normal in course and caliber. Delayed imaging through the kidneys demonstrates symmetric prompt contrast excretion within the proximal urinary collecting system. Urinary bladder is decompressed and unremarkable. Normal adrenal glands. STOMACH/BOWEL: The stomach, small and large bowel are normal in course and caliber without inflammatory changes. VASCULAR/LYMPHATIC: Aortoiliac vessels are normal in course and caliber, severe calcific atherosclerosis. Small inferior vena cava most compatible with chronic nonocclusive thrombosis gait and prior catheter placement. LEFT femoral bypass, with similar poor contrast opacification concerning for chronic occlusion. No lymphadenopathy by CT size criteria. REPRODUCTIVE: Status post hysterectomy. OTHER: No intraperitoneal free fluid or free air. MUSCULOSKELETAL: Nonacute. Coarse calcifications in the bilateral breasts. Partially calcified 14 mm RIGHT breast mass. LEFT femoral popliteal bypass. Heterogeneous contrast opacification of the external iliac vein/femoral vein compared to the RIGHT. LEFT anterior compartment muscle enlargement and fat stranding. Osteopenia. Old L2 compression fracture, status post cement augmentation, severe old L5  compression fracture and cement augmentation. Mild old L4 compression fracture. Anterior abdominal wall scarring. IMPRESSION: Bulky LEFT ileal psoas muscle LEFT iliopsoas muscle enlargement with surrounding fat stranding concerning for intramuscular hemorrhage. Findings less likely due to deep vein thrombosis or myositis. Chronic partial occlusion of the IVC. Hyperenhancing in liver is most compatible with arterioportal shunt. 14 mm partially calcified RIGHT breast mass, possible intramammary lymph node. Recommend correlation with mammography if not recently performed. Severe atherosclerosis. Acute  findings discussed with and reconfirmed by PA ROBERT BROWNING on 04/10/2017 at 3:20 pm. Electronically Signed   By: Elon Alas M.D.   On: 04/10/2017 15:22        Scheduled Meds: . amLODipine  2.5 mg Oral QHS  . calcium acetate  1,334 mg Oral BID WC  . cloNIDine  0.1 mg Oral QHS  . latanoprost  1 drop Both Eyes QHS  . pantoprazole  40 mg Oral Once  . sodium chloride flush  3 mL Intravenous Q12H         Aline August, MD [facility] Pager [phone] If 7PM-7AM, please contact night-coverage [email or web address] Password [email or web address] 04/11/2017, 11:12 AM

## 2017-04-12 LAB — BASIC METABOLIC PANEL
Anion gap: 17 — ABNORMAL HIGH (ref 5–15)
BUN: 51 mg/dL — AB (ref 6–20)
CHLORIDE: 91 mmol/L — AB (ref 101–111)
CO2: 23 mmol/L (ref 22–32)
CREATININE: 8.3 mg/dL — AB (ref 0.44–1.00)
Calcium: 9.6 mg/dL (ref 8.9–10.3)
GFR calc Af Amer: 5 mL/min — ABNORMAL LOW (ref >60)
GFR calc non Af Amer: 4 mL/min — ABNORMAL LOW (ref >60)
GLUCOSE: 100 mg/dL — AB (ref 65–99)
POTASSIUM: 5.3 mmol/L — AB (ref 3.5–5.1)
Sodium: 131 mmol/L — ABNORMAL LOW (ref 135–145)

## 2017-04-12 LAB — CBC WITH DIFFERENTIAL/PLATELET
Basophils Absolute: 0 10*3/uL (ref 0.0–0.1)
Basophils Relative: 1 %
EOS ABS: 0.2 10*3/uL (ref 0.0–0.7)
Eosinophils Relative: 3 %
HEMATOCRIT: 30.5 % — AB (ref 36.0–46.0)
HEMOGLOBIN: 9.5 g/dL — AB (ref 12.0–15.0)
LYMPHS ABS: 1.3 10*3/uL (ref 0.7–4.0)
LYMPHS PCT: 22 %
MCH: 30.4 pg (ref 26.0–34.0)
MCHC: 31.1 g/dL (ref 30.0–36.0)
MCV: 97.4 fL (ref 78.0–100.0)
MONOS PCT: 8 %
Monocytes Absolute: 0.5 10*3/uL (ref 0.1–1.0)
NEUTROS ABS: 4.1 10*3/uL (ref 1.7–7.7)
NEUTROS PCT: 66 %
Platelets: 172 10*3/uL (ref 150–400)
RBC: 3.13 MIL/uL — AB (ref 3.87–5.11)
RDW: 15.7 % — ABNORMAL HIGH (ref 11.5–15.5)
WBC: 6.1 10*3/uL (ref 4.0–10.5)

## 2017-04-12 LAB — PROTIME-INR
INR: 1.01
PROTHROMBIN TIME: 13.4 s (ref 11.4–15.2)

## 2017-04-12 MED ORDER — SODIUM CHLORIDE 0.9 % IV SOLN: 100.0000 mL | INTRAVENOUS | Status: DC | PRN

## 2017-04-12 MED ORDER — LIDOCAINE-PRILOCAINE 2.5-2.5 % EX CREA: 1.0000 "application " | TOPICAL_CREAM | CUTANEOUS | Status: DC | PRN

## 2017-04-12 MED ORDER — HEPARIN SODIUM (PORCINE) 1000 UNIT/ML DIALYSIS: 1000.0000 [IU] | INTRAMUSCULAR | Status: DC | PRN

## 2017-04-12 MED ORDER — PENTAFLUOROPROP-TETRAFLUOROETH EX AERO: 1.0000 "application " | INHALATION_SPRAY | CUTANEOUS | Status: DC | PRN

## 2017-04-12 MED ORDER — ALTEPLASE 2 MG IJ SOLR: 2.0000 mg | Freq: Once | INTRAMUSCULAR | Status: DC | PRN

## 2017-04-12 MED ORDER — LIDOCAINE HCL (PF) 1 % IJ SOLN: 5.0000 mL | INTRAMUSCULAR | Status: DC | PRN

## 2017-04-12 MED ORDER — OXYCODONE HCL 5 MG PO TABS
ORAL_TABLET | ORAL | Status: AC
  Filled 2017-04-12: qty 1

## 2017-04-12 NOTE — Care Management Note (Signed)
Case Management Note  Patient Details  Name: [name] MRN: [id number] Date of Birth: 02/15/1934  Subjective/Objective:   CM following for progression and d/c planning.                   Action/Plan: 04/12/2017 Met with pt on 04/11/2017 who is agreeable to HHPT. [place] notified of need for HHPT.  Pt has walker at home and wheelchair, has [facility] for transportation to HD.  Lives with family members.   Expected Discharge Date:    04/13/2017              Expected Discharge Plan:  [place]  In-House Referral:  NA  Discharge planning Services  CM Consult  Post Acute Care Choice:  Home Health Choice offered to:  Patient  DME Arranged:  N/A DME Agency:  NA  HH Arranged:  PT [place] Agency:  [place]  Status of Service:  Completed, signed off  If discussed at [place] of Stay Meetings, dates discussed:    Additional Comments:  Adron Bene, RN 04/12/2017, 1:36 PM

## 2017-04-12 NOTE — Progress Notes (Signed)
Patient ID: [name], female   DOB: [birth year], 83 y.o.   MRN: [id number]  PROGRESS NOTE    [name]  NFA:[id number] DOB: 1934-01-04 DOA: 04/10/2017 PCP: Dorothyann Peng, NP   Brief Narrative:  83 y.o.femalewith medical history significant of ESRD on HD (MWF) presented with complain of left leg pain and lower abdominal pain for the last 2-3 days. She was found to have left iliopsoas hematoma. Her pain is improving. She had vomiting yesterday which is improving.  Assessment & Plan:   Active Problems:   Iliopsoas muscle hematoma   HTN (hypertension)   RENAL FAILURE, END STAGE  1. Probable Left iliopsoas muscle hematoma: questionable cause; patient not on anticoagulation; no history of trauma/falls.  -hold aspirin -pain management: pain improving with current meds. -PT follow-up. Patient still feels very weak and not ready for discharge. Probable discharge tomorrow -Patient might need f/u repeat CT as an outpatient in 1-2 wks time to check for resolution  2. ESRD on HD: hemodialysis today. Nephrology follow-up has been appreciated  3. HTN: BP improved; monitor BP; continue current antihypertensives; Amlodipine and Clonidine.   4. Anemia: probably anemia of chronic disease from renal failure and from iliopsoas hematoma; Hb stable; repeat Hb in AM   5. 33mm partially calcified right breast mass: probable inflammatory lymph node; outpatient follow up with PCP for need of repeat Mammography  6. Mild hyperkalemia: Due to renal failure; repeat labs in a.m.   DVT prophylaxis:SCDs; avoid heparin due to hematoma Code Status:Full Family Communication:No family present at bedside. Disposition Plan:Home in 1-2 days; might need home care Consultants:   Dr. Mercy Moore (Nephrology)  Procedures: None Antimicrobials: None   Subjective: Patient seen and examined undergoing dialysis today. A CT feels weak and tired and states that she is not ready to go home. She had  multiple episodes of vomiting yesterday during the daytime but currently she feels better.   Objective: Vitals:   04/12/17 0900 04/12/17 0934 04/12/17 0955 04/12/17 1024  BP: (!) 110/52 (!) 205/93 103/63 (!) 94/42  Pulse: (!) 107 (!) 56 62 78  Resp:   17 16  Temp:   97.6 F (36.4 C) 97.5 F (36.4 C)  TempSrc:   Oral Oral  SpO2:   100% 98%  Weight:   58.3 kg (128 lb 8.5 oz)   Height:        Intake/Output Summary (Last 24 hours) at 04/12/17 1236 Last data filed at 04/12/17 1025  Gross per 24 hour  Intake              480 ml  Output             1032 ml  Net             -552 ml   Filed Weights   04/11/17 2035 04/12/17 0710 04/12/17 0955  Weight: 58.5 kg (129 lb) 59.2 kg (130 lb 8.2 oz) 58.3 kg (128 lb 8.5 oz)    Examination:  General exam: Appears calm and comfortable  Respiratory system: Clear to auscultation. Respiratory effort normal. Cardiovascular system: S1 & S2 heard, rate controlled. Gastrointestinal system: Abdomen is nondistended, soft and nontender. No organomegaly or masses felt. Normal bowel sounds heard. Central nervous system: Alert and oriented. No focal neurological deficits. Extremities: no clubbing / cyanosis. Tenderness over the left hip and upper thigh. Decreased ROM of the left hip     Data Reviewed: I have personally reviewed following labs and imaging studies  CBC:  Recent  Labs Lab 04/10/17 1330 04/11/17 0405 04/12/17 0436  WBC 7.6 6.4 6.1  NEUTROABS 6.6  --  4.1  HGB 10.1* 9.1* 9.5*  HCT 31.1* 28.2* 30.5*  MCV 96.0 97.2 97.4  PLT 193 182 528   Basic Metabolic Panel:  Recent Labs Lab 04/10/17 1330 04/11/17 0405 04/12/17 0436  NA 137 134* 131*  K 4.2 5.5* 5.3*  CL 96* 96* 91*  CO2 25 24 23   GLUCOSE 153* 116* 100*  BUN 16 27* 51*  CREATININE 3.92* 5.51* 8.30*  CALCIUM 9.4 10.2 9.6   GFR: Estimated Creatinine Clearance: 4.1 mL/min (A) (by C-G formula based on SCr of 8.3 mg/dL (H)). Liver Function Tests:  Recent Labs Lab  04/11/17 0405  AST 22  ALT 14  ALKPHOS 82  BILITOT 0.5  PROT 7.6  ALBUMIN 3.8   No results for input(s): LIPASE, AMYLASE in the last 168 hours. No results for input(s): AMMONIA in the last 168 hours. Coagulation Profile:  Recent Labs Lab 04/11/17 0405 04/12/17 0436  INR 1.13 1.01   Cardiac Enzymes: No results for input(s): CKTOTAL, CKMB, CKMBINDEX, TROPONINI in the last 168 hours. BNP (last 3 results) No results for input(s): PROBNP in the last 8760 hours. HbA1C: No results for input(s): HGBA1C in the last 72 hours. CBG: No results for input(s): GLUCAP in the last 168 hours. Lipid Profile: No results for input(s): CHOL, HDL, LDLCALC, TRIG, CHOLHDL, LDLDIRECT in the last 72 hours. Thyroid Function Tests: No results for input(s): TSH, T4TOTAL, FREET4, T3FREE, THYROIDAB in the last 72 hours. Anemia Panel: No results for input(s): VITAMINB12, FOLATE, FERRITIN, TIBC, IRON, RETICCTPCT in the last 72 hours. Sepsis Labs: No results for input(s): PROCALCITON, LATICACIDVEN in the last 168 hours.  Recent Results (from the past 240 hour(s))  MRSA PCR Screening     Status: None   Collection Time: 04/11/17  4:41 AM  Result Value Ref Range Status   MRSA by PCR NEGATIVE NEGATIVE Final    Comment:        The GeneXpert MRSA Assay (FDA approved for NASAL specimens only), is one component of a comprehensive MRSA colonization surveillance program. It is not intended to diagnose MRSA infection nor to guide or monitor treatment for MRSA infections.          Radiology Studies: Ct Abdomen Pelvis W Contrast  Result Date: 04/10/2017 CLINICAL DATA:  LEFT lower quadrant pain, nausea and vomiting after dialysis today. History of diabetes, end-stage renal disease on dialysis, hypertension, appendectomy. EXAM: CT ABDOMEN AND PELVIS WITH CONTRAST TECHNIQUE: Multidetector CT imaging of the abdomen and pelvis was performed using the standard protocol following bolus administration of  intravenous contrast. CONTRAST:  162mL ISOVUE-300 IOPAMIDOL (ISOVUE-300) INJECTION 61% COMPARISON:  CT abdomen and pelvis March 08, 2013 FINDINGS: LOWER CHEST: Lung bases are clear. Heart size is normal, dialysis catheter terminates in the cavoatrial junction. Severe coronary artery calcifications. HEPATOBILIARY: Patchy peripheral hyperenhancing peripheral aspect LEFT lobe of the liver and segment 4 with early washout most compatible with shunt. PANCREAS: Normal. SPLEEN: Normal. ADRENALS/URINARY TRACT: Severely atrophic kidneys. 15 mm RIGHT upper pole cyst. No nephrolithiasis, hydronephrosis or solid renal masses. The unopacified ureters are normal in course and caliber. Delayed imaging through the kidneys demonstrates symmetric prompt contrast excretion within the proximal urinary collecting system. Urinary bladder is decompressed and unremarkable. Normal adrenal glands. STOMACH/BOWEL: The stomach, small and large bowel are normal in course and caliber without inflammatory changes. VASCULAR/LYMPHATIC: Aortoiliac vessels are normal in course and caliber, severe calcific atherosclerosis.  Small inferior vena cava most compatible with chronic nonocclusive thrombosis gait and prior catheter placement. LEFT femoral bypass, with similar poor contrast opacification concerning for chronic occlusion. No lymphadenopathy by CT size criteria. REPRODUCTIVE: Status post hysterectomy. OTHER: No intraperitoneal free fluid or free air. MUSCULOSKELETAL: Nonacute. Coarse calcifications in the bilateral breasts. Partially calcified 14 mm RIGHT breast mass. LEFT femoral popliteal bypass. Heterogeneous contrast opacification of the external iliac vein/femoral vein compared to the RIGHT. LEFT anterior compartment muscle enlargement and fat stranding. Osteopenia. Old L2 compression fracture, status post cement augmentation, severe old L5 compression fracture and cement augmentation. Mild old L4 compression fracture. Anterior abdominal wall  scarring. IMPRESSION: Bulky LEFT ileal psoas muscle LEFT iliopsoas muscle enlargement with surrounding fat stranding concerning for intramuscular hemorrhage. Findings less likely due to deep vein thrombosis or myositis. Chronic partial occlusion of the IVC. Hyperenhancing in liver is most compatible with arterioportal shunt. 14 mm partially calcified RIGHT breast mass, possible intramammary lymph node. Recommend correlation with mammography if not recently performed. Severe atherosclerosis. Acute findings discussed with and reconfirmed by PA ROBERT BROWNING on 04/10/2017 at 3:20 pm. Electronically Signed   By: Elon Alas M.D.   On: 04/10/2017 15:22        Scheduled Meds: . amLODipine  2.5 mg Oral QHS  . calcium acetate  1,334 mg Oral BID WC  . cloNIDine  0.1 mg Oral QHS  . latanoprost  1 drop Both Eyes QHS  . sodium chloride flush  3 mL Intravenous Q12H      Aline August, MD [facility] Pager [phone]  If 7PM-7AM, please contact night-coverage [email or web address] Password [facility] 04/12/2017, 12:36 PM

## 2017-04-12 NOTE — Consult Note (Signed)
           [facility] CM Primary Care Navigator  04/12/2017  [name] January 23, 1934 [id number]    Went back to see patient at the bedside to identify possible discharge needs. Patient reports having left groin pain and lower abdominal pain that had led to this admission. She was found to have hematoma.  Patient endorses [name] with [facility] as the primary care provider.   Patient shared using [facility] to obtain medications without any problem.   Patient states she manages her own medications at home straight out of the containers.  Her daughter [name]) provides transportation to her doctors' appointments after. She uses [employer] for dialysis M-W-F as stated.  Patient reports living with her granddaughter [name] and kid). Her granddaughter will be assisting with her care at home according to patient.  Anticipated discharge plan is home with home health services perpatient.   Patient  voiced understanding to call primary care provider's office when she gets home, for a post discharge follow-up appointment within a week or sooner if needed.Patient letter (with PCP's contact number) was provided as herreminder.  Explained to patient about [facility] CM services available for health management but she declined and denied any further needs at this time. She reports that COPD "has not been a bother at all". She states that DM is controlled and has not been on medication for it (last A1c on record is 5.9 on 10/2012). [facility] care management contact information provided for future needs that may arise.   Primary care provider's office notified [name] - per in basket e-mail) regarding patient's health issues and concerns for follow-up.  For questions, please contact:  [name], BSN, RN- [facility] Primary Care Navigator  Telephone: [phone] [place]

## 2017-04-12 NOTE — Procedures (Signed)
Pt seen on HD.  Ap 180 Vp 150.  BFR 400.  Eating well.  Has been up walking.  She should be able to after HD.

## 2017-04-13 LAB — BASIC METABOLIC PANEL
Anion gap: 15 (ref 5–15)
BUN: 28 mg/dL — ABNORMAL HIGH (ref 6–20)
CALCIUM: 9.5 mg/dL (ref 8.9–10.3)
CO2: 24 mmol/L (ref 22–32)
Chloride: 93 mmol/L — ABNORMAL LOW (ref 101–111)
Creatinine, Ser: 5.85 mg/dL — ABNORMAL HIGH (ref 0.44–1.00)
GFR calc non Af Amer: 6 mL/min — ABNORMAL LOW (ref >60)
GFR, EST AFRICAN AMERICAN: 7 mL/min — AB (ref >60)
Glucose, Bld: 122 mg/dL — ABNORMAL HIGH (ref 65–99)
POTASSIUM: 4.2 mmol/L (ref 3.5–5.1)
SODIUM: 132 mmol/L — AB (ref 135–145)

## 2017-04-13 LAB — CBC WITH DIFFERENTIAL/PLATELET
BASOS ABS: 0 10*3/uL (ref 0.0–0.1)
BASOS PCT: 0 %
EOS ABS: 0.2 10*3/uL (ref 0.0–0.7)
EOS PCT: 3 %
HCT: 27.9 % — ABNORMAL LOW (ref 36.0–46.0)
Hemoglobin: 9.1 g/dL — ABNORMAL LOW (ref 12.0–15.0)
LYMPHS PCT: 24 %
Lymphs Abs: 1.5 10*3/uL (ref 0.7–4.0)
MCH: 31.5 pg (ref 26.0–34.0)
MCHC: 32.6 g/dL (ref 30.0–36.0)
MCV: 96.5 fL (ref 78.0–100.0)
MONO ABS: 0.5 10*3/uL (ref 0.1–1.0)
Monocytes Relative: 8 %
Neutro Abs: 4 10*3/uL (ref 1.7–7.7)
Neutrophils Relative %: 65 %
Platelets: 167 10*3/uL (ref 150–400)
RBC: 2.89 MIL/uL — AB (ref 3.87–5.11)
RDW: 15.4 % (ref 11.5–15.5)
WBC: 6.2 10*3/uL (ref 4.0–10.5)

## 2017-04-13 MED ORDER — ONDANSETRON HCL 4 MG PO TABS: 4.0000 mg | ORAL_TABLET | Freq: Four times a day (QID) | ORAL | 0 refills | Status: DC | PRN

## 2017-04-13 MED ORDER — OXYCODONE HCL 5 MG PO TABS: 5.0000 mg | ORAL_TABLET | Freq: Four times a day (QID) | ORAL | 0 refills | Status: DC | PRN

## 2017-04-13 MED ORDER — DARBEPOETIN ALFA 60 MCG/0.3ML IJ SOSY: 60.0000 ug | PREFILLED_SYRINGE | INTRAMUSCULAR | Status: DC

## 2017-04-13 NOTE — Discharge Instructions (Addendum)
Follow up with outpatient dialysis as scheduled

## 2017-04-13 NOTE — Progress Notes (Signed)
[facility] Progress Note  Dialysis Orders:  MWF 3.5 hr 400/800 EDW 57 2 K 2Ca profile 4 right IJ heparin 7000 with 3000 mid  Micera 75 q 4 weeks - last 3/26 Hectorol 5 just ^ from 3) no Fe Recent labs: hgb 10.9 4/4 increasing 48% sat iPTh 921 - no sensipar - causes GI sx  Assessment/Plan: 1. Left spontaneous iliopsoas hematoma - hold asa and heparin for now 2. Nausea and vomiting - better 3. ESRD -  MWF - HD no heparin; K 4.2 4.  Hypertension/volume  - dialysis records indicate she is only on 2.5 norvasc and clonidine 0.1 metoprolol stopped Net UF 1 L Friday with post wt 58.3 - above outpt EDW 5. Anemia  - hgb 9.1 stable hgb decline likely due to hematoma - resume ESA Monday or next week if d/c today  prior Fe was ok 6. Metabolic bone disease -  Calcium up on admission, down some - hectorol recently ^ to 5 from 3 - will reduce to 4; on phoslo - intolerant of sensipar 7. Nutrition - alb 3.8 ate all of breakfast 8. 14 mm partially calcified right breast mass - las mammogram was 05/2014 after fall with left breast hematoma; no evidence of breast cancer at that time; she would consider repeat mammogram in the future- can address as an outpt with PCP 9. Disp - ok for d/c today per renal  Myriam Jacobson, PA-C [place] [phone] 04/13/2017,9:13 AM  LOS: 3 days  I have seen and examined this patient and agree with plan per Amalia Hailey.  OK for DC from renal standpoint.Marland Kitchen Seyon Strader T,MD 04/18/2017 3:41 PM Subjective:   s/o of HD early yesterday - felt bad; more specifically stomach felt bad "I get that way some time." Denies BP drop on HD. (Actually did drop in to 90s) Feels like stomach is tight. Last BM Tuesday - eats pecans at home to help bowels move. Likes staying her. Gets a break from her grandaughter and her 56 year old daughter  Objective Vitals:   04/12/17 1745 04/12/17 2217 04/13/17 0213 04/13/17 0621  BP: 132/89 126/66  (!) 107/43   Pulse: 78 78  71  Resp: 14 14  16   Temp: 98 F (36.7 C) 98.3 F (36.8 C)  98.2 F (36.8 C)  TempSrc: Axillary Oral  Oral  SpO2: 98% 100%  100%  Weight:   58.3 kg (128 lb 8.5 oz)   Height:       Physical Exam General: NAD Heart: RRR Lungs: no rales Abdomen: soft ,slight distension, active BS Extremities: no LE edema Dialysis Access:  Right IJ   Additional Objective Labs: Basic Metabolic Panel:  Recent Labs Lab 04/11/17 0405 04/12/17 0436 04/13/17 0354  NA 134* 131* 132*  K 5.5* 5.3* 4.2  CL 96* 91* 93*  CO2 24 23 24   GLUCOSE 116* 100* 122*  BUN 27* 51* 28*  CREATININE 5.51* 8.30* 5.85*  CALCIUM 10.2 9.6 9.5   Liver Function Tests:  Recent Labs Lab 04/11/17 0405  AST 22  ALT 14  ALKPHOS 82  BILITOT 0.5  PROT 7.6  ALBUMIN 3.8    CBC:  Recent Labs Lab 04/10/17 1330 04/11/17 0405 04/12/17 0436 04/13/17 0354  WBC 7.6 6.4 6.1 6.2  NEUTROABS 6.6  --  4.1 4.0  HGB 10.1* 9.1* 9.5* 9.1*  HCT 31.1* 28.2* 30.5* 27.9*  MCV 96.0 97.2 97.4 96.5  PLT 193 182 172 167   Blood Culture No  results found for: SDES, SPECREQUEST, CULT, REPTSTATUS  Cardiac Enzymes: No results for input(s): CKTOTAL, CKMB, CKMBINDEX, TROPONINI in the last 168 hours. CBG: No results for input(s): GLUCAP in the last 168 hours. Iron Studies: No results for input(s): IRON, TIBC, TRANSFERRIN, FERRITIN in the last 72 hours. Lab Results  Component Value Date   INR 1.01 04/12/2017   INR 1.13 04/11/2017   INR 1.04 05/10/2015   Studies/Results: No results found. Medications:  . amLODipine  2.5 mg Oral QHS  . calcium acetate  1,334 mg Oral BID WC  . cloNIDine  0.1 mg Oral QHS  . latanoprost  1 drop Both Eyes QHS  . sodium chloride flush  3 mL Intravenous Q12H

## 2017-04-13 NOTE — Plan of Care (Signed)
Problem: Bowel/Gastric: Goal: Will not experience complications related to bowel motility Outcome: Progressing Patient reports last bm on Tuesday 4/10. Patient states this is normal. At home she eats pecans which facilitate her bowel movements, but she is currently out of pecans. Noted Senokot prn order for bedtime. Will continue to monitor. Bartholomew Crews, RN

## 2017-04-13 NOTE — Discharge Summary (Signed)
Physician Discharge Summary  [name] IDP:[id number] DOB: [birth year] DOA: 04/10/2017  PCP: Dorothyann Peng, NP  Admit date: 04/10/2017 Discharge date: 04/13/2017  Admitted From:  Home Disposition:  Home  Recommendations for Outpatient Follow-up:  1. Follow up with PCP in 1-2 weeks 2. Patient might need outpatient CT abdomen/pelvis in 1-2 wks to follow up on iliopsoas hematoma  Home Health: Yes Equipment/Devices: None  Discharge Condition: Stable CODE STATUS: Full Diet recommendation: Heart Healthy / Renal hemodialysis diet  Brief/Interim Summary: 83 y.o.femalewith medical history significant of ESRD on HD (MWF) presentedwith complain of left leg pain and lower abdominal pain for the last 2-3 days. She was found to have left iliopsoas hematoma. Her pain is improving and she is tolerating PT. She had vomiting during the hospitalization which has improved. She is stable for discharge home with home health. She tolerated inpatient dialysis as well. Discharge Diagnoses:  Active Problems:   Iliopsoas muscle hematoma   HTN (hypertension)   RENAL FAILURE, END STAGE  1. Probable Left iliopsoas muscle hematoma: questionable cause; patient not on anticoagulation; no history of trauma/falls.  -hold aspirin -outpatient PT follow up -Patient might need f/u repeat CT as an outpatient in 1-2 wks time to check for resolution  2. ESRD on HD: continue hemodialysis outpatient as scheduled.  3. HTN: BP on the higher side; continue Amlodipine and Clonidine. Resume home metoprolol.  4. Anemia: probably anemia of chronic disease from renal failure and from iliopsoas hematoma; Hb stable  5. 25mm partially calcified right breast mass: probable inflammatory lymph node; outpatient follow up with PCP for need of repeat Mammography  6. Mild hyperkalemia: Due to renal failure; improved.   Discharge Instructions  Discharge Instructions    Call MD for:  difficulty breathing, headache or  visual disturbances    Complete by:  As directed    Call MD for:  extreme fatigue    Complete by:  As directed    Call MD for:  persistant nausea and vomiting    Complete by:  As directed    Call MD for:  severe uncontrolled pain    Complete by:  As directed    Call MD for:  temperature >100.4    Complete by:  As directed    Diet - low sodium heart healthy    Complete by:  As directed    Increase activity slowly    Complete by:  As directed      Allergies as of 04/13/2017      Reactions   Ace Inhibitors Other (See Comments)   Reaction unknown      Medication List    STOP taking these medications   aspirin 81 MG tablet     TAKE these medications   amLODipine 2.5 MG tablet Commonly known as:  NORVASC TAKE 1 TABLET (2.5 MG TOTAL) BY MOUTH DAILY.   calcium acetate 667 MG capsule Commonly known as:  PHOSLO Take 2 capsules by mouth 2 (two) times daily with a meal.   cloNIDine 0.1 MG tablet Commonly known as:  CATAPRES TAKE 1 TABLET (0.1 MG TOTAL) BY MOUTH DAILY. What changed:  when to take this   latanoprost 0.005 % ophthalmic solution Commonly known as:  XALATAN 1 drop at bedtime.   metoprolol succinate 50 MG 24 hr tablet Commonly known as:  TOPROL-XL Take 50 mg by mouth daily. Take with or immediately following a meal.   ondansetron 4 MG tablet Commonly known as:  ZOFRAN Take 1 tablet (4 mg  total) by mouth every 6 (six) hours as needed for nausea.   oxyCODONE 5 MG immediate release tablet Commonly known as:  Oxy IR/ROXICODONE Take 1 tablet (5 mg total) by mouth every 6 (six) hours as needed for moderate pain.   pantoprazole 40 MG tablet Commonly known as:  PROTONIX Take 40 mg by mouth at bedtime as needed (occasional).      Follow-up Information    Dorothyann Peng, NP Follow up in 1 week(s).   Specialty:  Family Medicine Contact information: [place] [address] [phone]          Allergies  Allergen Reactions  . Ace  Inhibitors Other (See Comments)    Reaction unknown    Consultations:  Dr. Mercy Moore (Nephrology)  Procedures/Studies: Ct Abdomen Pelvis W Contrast  Result Date: 04/10/2017 CLINICAL DATA:  LEFT lower quadrant pain, nausea and vomiting after dialysis today. History of diabetes, end-stage renal disease on dialysis, hypertension, appendectomy. EXAM: CT ABDOMEN AND PELVIS WITH CONTRAST TECHNIQUE: Multidetector CT imaging of the abdomen and pelvis was performed using the standard protocol following bolus administration of intravenous contrast. CONTRAST:  165mL ISOVUE-300 IOPAMIDOL (ISOVUE-300) INJECTION 61% COMPARISON:  CT abdomen and pelvis March 08, 2013 FINDINGS: LOWER CHEST: Lung bases are clear. Heart size is normal, dialysis catheter terminates in the cavoatrial junction. Severe coronary artery calcifications. HEPATOBILIARY: Patchy peripheral hyperenhancing peripheral aspect LEFT lobe of the liver and segment 4 with early washout most compatible with shunt. PANCREAS: Normal. SPLEEN: Normal. ADRENALS/URINARY TRACT: Severely atrophic kidneys. 15 mm RIGHT upper pole cyst. No nephrolithiasis, hydronephrosis or solid renal masses. The unopacified ureters are normal in course and caliber. Delayed imaging through the kidneys demonstrates symmetric prompt contrast excretion within the proximal urinary collecting system. Urinary bladder is decompressed and unremarkable. Normal adrenal glands. STOMACH/BOWEL: The stomach, small and large bowel are normal in course and caliber without inflammatory changes. VASCULAR/LYMPHATIC: Aortoiliac vessels are normal in course and caliber, severe calcific atherosclerosis. Small inferior vena cava most compatible with chronic nonocclusive thrombosis gait and prior catheter placement. LEFT femoral bypass, with similar poor contrast opacification concerning for chronic occlusion. No lymphadenopathy by CT size criteria. REPRODUCTIVE: Status post hysterectomy. OTHER: No  intraperitoneal free fluid or free air. MUSCULOSKELETAL: Nonacute. Coarse calcifications in the bilateral breasts. Partially calcified 14 mm RIGHT breast mass. LEFT femoral popliteal bypass. Heterogeneous contrast opacification of the external iliac vein/femoral vein compared to the RIGHT. LEFT anterior compartment muscle enlargement and fat stranding. Osteopenia. Old L2 compression fracture, status post cement augmentation, severe old L5 compression fracture and cement augmentation. Mild old L4 compression fracture. Anterior abdominal wall scarring. IMPRESSION: Bulky LEFT ileal psoas muscle LEFT iliopsoas muscle enlargement with surrounding fat stranding concerning for intramuscular hemorrhage. Findings less likely due to deep vein thrombosis or myositis. Chronic partial occlusion of the IVC. Hyperenhancing in liver is most compatible with arterioportal shunt. 14 mm partially calcified RIGHT breast mass, possible intramammary lymph node. Recommend correlation with mammography if not recently performed. Severe atherosclerosis. Acute findings discussed with and reconfirmed by PA ROBERT BROWNING on 04/10/2017 at 3:20 pm. Electronically Signed   By: Elon Alas M.D.   On: 04/10/2017 15:22     Subjective:   Discharge Exam: Vitals:   04/13/17 0621 04/13/17 1007  BP: (!) 107/43 (!) 152/65  Pulse: 71 78  Resp: 16 14  Temp: 98.2 F (36.8 C) 98.2 F (36.8 C)   Vitals:   04/12/17 2217 04/13/17 0213 04/13/17 0621 04/13/17 1007  BP: 126/66  Marland Kitchen)  107/43 (!) 152/65  Pulse: 78  71 78  Resp: 14  16 14   Temp: 98.3 F (36.8 C)  98.2 F (36.8 C) 98.2 F (36.8 C)  TempSrc: Oral  Oral Oral  SpO2: 100%  100% 100%  Weight:  58.3 kg (128 lb 8.5 oz)    Height:        General exam:Appears calm and comfortable  Respiratory system: Clear to auscultation. Respiratory effort normal. Cardiovascular system:S1 &S2 heard, rate controlled. Gastrointestinal system:Abdomen is nondistended, soft and nontender.  No organomegaly or masses felt. Normal bowel sounds heard. Central nervous system:Alert and oriented. No focal neurological deficits. Extremities: no clubbing / cyanosis. Tenderness over the left hip and upper thigh. Decreased ROM of the left hip     The results of significant diagnostics from this hospitalization (including imaging, microbiology, ancillary and laboratory) are listed below for reference.     Microbiology: Recent Results (from the past 240 hour(s))  MRSA PCR Screening     Status: None   Collection Time: 04/11/17  4:41 AM  Result Value Ref Range Status   MRSA by PCR NEGATIVE NEGATIVE Final    Comment:        The GeneXpert MRSA Assay (FDA approved for NASAL specimens only), is one component of a comprehensive MRSA colonization surveillance program. It is not intended to diagnose MRSA infection nor to guide or monitor treatment for MRSA infections.      Labs: BNP (last 3 results) No results for input(s): BNP in the last 8760 hours. Basic Metabolic Panel:  Recent Labs Lab 04/10/17 1330 04/11/17 0405 04/12/17 0436 04/13/17 0354  NA 137 134* 131* 132*  K 4.2 5.5* 5.3* 4.2  CL 96* 96* 91* 93*  CO2 25 24 23 24   GLUCOSE 153* 116* 100* 122*  BUN 16 27* 51* 28*  CREATININE 3.92* 5.51* 8.30* 5.85*  CALCIUM 9.4 10.2 9.6 9.5   Liver Function Tests:  Recent Labs Lab 04/11/17 0405  AST 22  ALT 14  ALKPHOS 82  BILITOT 0.5  PROT 7.6  ALBUMIN 3.8   No results for input(s): LIPASE, AMYLASE in the last 168 hours. No results for input(s): AMMONIA in the last 168 hours. CBC:  Recent Labs Lab 04/10/17 1330 04/11/17 0405 04/12/17 0436 04/13/17 0354  WBC 7.6 6.4 6.1 6.2  NEUTROABS 6.6  --  4.1 4.0  HGB 10.1* 9.1* 9.5* 9.1*  HCT 31.1* 28.2* 30.5* 27.9*  MCV 96.0 97.2 97.4 96.5  PLT 193 182 172 167   Cardiac Enzymes: No results for input(s): CKTOTAL, CKMB, CKMBINDEX, TROPONINI in the last 168 hours. BNP: Invalid input(s): POCBNP CBG: No  results for input(s): GLUCAP in the last 168 hours. D-Dimer No results for input(s): DDIMER in the last 72 hours. Hgb A1c No results for input(s): HGBA1C in the last 72 hours. Lipid Profile No results for input(s): CHOL, HDL, LDLCALC, TRIG, CHOLHDL, LDLDIRECT in the last 72 hours. Thyroid function studies No results for input(s): TSH, T4TOTAL, T3FREE, THYROIDAB in the last 72 hours.  Invalid input(s): FREET3 Anemia work up No results for input(s): VITAMINB12, FOLATE, FERRITIN, TIBC, IRON, RETICCTPCT in the last 72 hours. Urinalysis No results found for: COLORURINE, APPEARANCEUR, [place], [place], GLUCOSEU, [place], [place], [place], PROTEINUR, UROBILINOGEN, NITRITE, LEUKOCYTESUR Sepsis Labs Invalid input(s): PROCALCITONIN,  WBC,  LACTICIDVEN Microbiology Recent Results (from the past 240 hour(s))  MRSA PCR Screening     Status: None   Collection Time: 04/11/17  4:41 AM  Result Value Ref Range Status   MRSA  by PCR NEGATIVE NEGATIVE Final    Comment:        The GeneXpert MRSA Assay (FDA approved for NASAL specimens only), is one component of a comprehensive MRSA colonization surveillance program. It is not intended to diagnose MRSA infection nor to guide or monitor treatment for MRSA infections.      Time coordinating discharge: 35 minutes  SIGNED:   Aline August, MD  [facility] 04/13/2017, 10:39 AM Pager [phone]  If 7PM-7AM, please contact night-coverage [email or web address] Password [email or web address]

## 2017-04-13 NOTE — Progress Notes (Signed)
Discharge instructions reviewed with patient's daughter. Script provided to be taken to pharmacy, and advised of other prescription that was called in by MD. Pt escorted via wheelchair and RN escort. Bartholomew Crews, RN

## 2017-04-15 DIAGNOSIS — E1129 Type 2 diabetes mellitus with other diabetic kidney complication: Secondary | ICD-10-CM | POA: Diagnosis not present

## 2017-04-15 DIAGNOSIS — N2581 Secondary hyperparathyroidism of renal origin: Secondary | ICD-10-CM | POA: Diagnosis not present

## 2017-04-15 DIAGNOSIS — N186 End stage renal disease: Secondary | ICD-10-CM | POA: Diagnosis not present

## 2017-04-15 DIAGNOSIS — D631 Anemia in chronic kidney disease: Secondary | ICD-10-CM | POA: Diagnosis not present

## 2017-04-16 ENCOUNTER — Telehealth: Payer: Self-pay

## 2017-04-16 DIAGNOSIS — E1151 Type 2 diabetes mellitus with diabetic peripheral angiopathy without gangrene: Secondary | ICD-10-CM | POA: Diagnosis not present

## 2017-04-16 DIAGNOSIS — D649 Anemia, unspecified: Secondary | ICD-10-CM | POA: Diagnosis not present

## 2017-04-16 DIAGNOSIS — J45909 Unspecified asthma, uncomplicated: Secondary | ICD-10-CM | POA: Diagnosis not present

## 2017-04-16 DIAGNOSIS — N186 End stage renal disease: Secondary | ICD-10-CM | POA: Diagnosis not present

## 2017-04-16 DIAGNOSIS — E785 Hyperlipidemia, unspecified: Secondary | ICD-10-CM | POA: Diagnosis not present

## 2017-04-16 DIAGNOSIS — Z992 Dependence on renal dialysis: Secondary | ICD-10-CM | POA: Diagnosis not present

## 2017-04-16 DIAGNOSIS — I120 Hypertensive chronic kidney disease with stage 5 chronic kidney disease or end stage renal disease: Secondary | ICD-10-CM | POA: Diagnosis not present

## 2017-04-16 DIAGNOSIS — M1990 Unspecified osteoarthritis, unspecified site: Secondary | ICD-10-CM | POA: Diagnosis not present

## 2017-04-16 DIAGNOSIS — K219 Gastro-esophageal reflux disease without esophagitis: Secondary | ICD-10-CM | POA: Diagnosis not present

## 2017-04-16 DIAGNOSIS — E1122 Type 2 diabetes mellitus with diabetic chronic kidney disease: Secondary | ICD-10-CM | POA: Diagnosis not present

## 2017-04-16 DIAGNOSIS — M7981 Nontraumatic hematoma of soft tissue: Secondary | ICD-10-CM | POA: Diagnosis not present

## 2017-04-16 NOTE — Telephone Encounter (Signed)
D/C 04/13/17 To: home  Spoke with pt and daughter. Daughter states that pt is doing well. She has PT referral and they are coming out today for initial assessment. Per daughter pt is doing well and denies any pain or swelling at site of hematoma.   Appt scheduled 04/30/17 with Tommi Rumps. Pt/daughter aware.    Transition Care Management Follow-up Telephone Call  How have you been since you were released from the hospital? Good, improving   Do you understand why you were in the hospital? yes   Do you understand the discharge instrcutions? yes  Items Reviewed:  Medications reviewed: yes  Allergies reviewed: yes  Dietary changes reviewed: yes  Referrals reviewed: yes   Functional Questionnaire:   Activities of Daily Living (ADLs):   She states they are independent in the following: ambulation, bathing and hygiene, feeding, continence, grooming, toileting and dressing States they require assistance with the following: none   Any transportation issues/concerns?: no   Any patient concerns? no   Confirmed importance and date/time of follow-up visits scheduled: yes   Confirmed with patient if condition begins to worsen call PCP or go to the ER.  Patient was given the Call-a-Nurse line [phone]: yes

## 2017-04-17 DIAGNOSIS — D631 Anemia in chronic kidney disease: Secondary | ICD-10-CM | POA: Diagnosis not present

## 2017-04-17 DIAGNOSIS — N2581 Secondary hyperparathyroidism of renal origin: Secondary | ICD-10-CM | POA: Diagnosis not present

## 2017-04-17 DIAGNOSIS — E1129 Type 2 diabetes mellitus with other diabetic kidney complication: Secondary | ICD-10-CM | POA: Diagnosis not present

## 2017-04-17 DIAGNOSIS — N186 End stage renal disease: Secondary | ICD-10-CM | POA: Diagnosis not present

## 2017-04-19 DIAGNOSIS — N186 End stage renal disease: Secondary | ICD-10-CM | POA: Diagnosis not present

## 2017-04-19 DIAGNOSIS — D631 Anemia in chronic kidney disease: Secondary | ICD-10-CM | POA: Diagnosis not present

## 2017-04-19 DIAGNOSIS — M7981 Nontraumatic hematoma of soft tissue: Secondary | ICD-10-CM | POA: Diagnosis not present

## 2017-04-19 DIAGNOSIS — E1151 Type 2 diabetes mellitus with diabetic peripheral angiopathy without gangrene: Secondary | ICD-10-CM | POA: Diagnosis not present

## 2017-04-19 DIAGNOSIS — E1129 Type 2 diabetes mellitus with other diabetic kidney complication: Secondary | ICD-10-CM | POA: Diagnosis not present

## 2017-04-19 DIAGNOSIS — N2581 Secondary hyperparathyroidism of renal origin: Secondary | ICD-10-CM | POA: Diagnosis not present

## 2017-04-19 DIAGNOSIS — E1122 Type 2 diabetes mellitus with diabetic chronic kidney disease: Secondary | ICD-10-CM | POA: Diagnosis not present

## 2017-04-19 DIAGNOSIS — J45909 Unspecified asthma, uncomplicated: Secondary | ICD-10-CM | POA: Diagnosis not present

## 2017-04-19 DIAGNOSIS — I120 Hypertensive chronic kidney disease with stage 5 chronic kidney disease or end stage renal disease: Secondary | ICD-10-CM | POA: Diagnosis not present

## 2017-04-22 ENCOUNTER — Telehealth: Payer: Self-pay | Admitting: *Deleted

## 2017-04-22 DIAGNOSIS — N2581 Secondary hyperparathyroidism of renal origin: Secondary | ICD-10-CM | POA: Diagnosis not present

## 2017-04-22 DIAGNOSIS — E1129 Type 2 diabetes mellitus with other diabetic kidney complication: Secondary | ICD-10-CM | POA: Diagnosis not present

## 2017-04-22 DIAGNOSIS — N186 End stage renal disease: Secondary | ICD-10-CM | POA: Diagnosis not present

## 2017-04-22 DIAGNOSIS — D631 Anemia in chronic kidney disease: Secondary | ICD-10-CM | POA: Diagnosis not present

## 2017-04-22 NOTE — Telephone Encounter (Signed)
[name] [phone]) called for a verbal order for the following: PT 2x a week for 4 weeks due for functional mobility after recent hospitalization, home health aide for bathing and a medical social worker for community resources.  States he called last week and has not heard from anyone?  Please call [name].

## 2017-04-22 NOTE — Telephone Encounter (Signed)
Ok for verbal orders ?

## 2017-04-23 ENCOUNTER — Telehealth: Payer: Self-pay | Admitting: Adult Health

## 2017-04-23 DIAGNOSIS — I120 Hypertensive chronic kidney disease with stage 5 chronic kidney disease or end stage renal disease: Secondary | ICD-10-CM | POA: Diagnosis not present

## 2017-04-23 DIAGNOSIS — E1122 Type 2 diabetes mellitus with diabetic chronic kidney disease: Secondary | ICD-10-CM | POA: Diagnosis not present

## 2017-04-23 DIAGNOSIS — M7981 Nontraumatic hematoma of soft tissue: Secondary | ICD-10-CM | POA: Diagnosis not present

## 2017-04-23 DIAGNOSIS — N186 End stage renal disease: Secondary | ICD-10-CM | POA: Diagnosis not present

## 2017-04-23 DIAGNOSIS — J45909 Unspecified asthma, uncomplicated: Secondary | ICD-10-CM | POA: Diagnosis not present

## 2017-04-23 DIAGNOSIS — E1151 Type 2 diabetes mellitus with diabetic peripheral angiopathy without gangrene: Secondary | ICD-10-CM | POA: Diagnosis not present

## 2017-04-23 NOTE — Telephone Encounter (Signed)
Per Tommi Rumps, this is ok.  I spoke with [name], PT and he advised that he would relay this message to [place] about OT being ok'd by Tommi Rumps as well. Thanks!

## 2017-04-23 NOTE — Telephone Encounter (Signed)
[name] with [facility] needs verbal orders for OT 1 wk /1  2 wk / 3

## 2017-04-23 NOTE — Telephone Encounter (Signed)
[name] notified that these orders were ok per [facility] & verbalized understanding.

## 2017-04-25 DIAGNOSIS — J45909 Unspecified asthma, uncomplicated: Secondary | ICD-10-CM | POA: Diagnosis not present

## 2017-04-25 DIAGNOSIS — I120 Hypertensive chronic kidney disease with stage 5 chronic kidney disease or end stage renal disease: Secondary | ICD-10-CM | POA: Diagnosis not present

## 2017-04-25 DIAGNOSIS — M7981 Nontraumatic hematoma of soft tissue: Secondary | ICD-10-CM | POA: Diagnosis not present

## 2017-04-25 DIAGNOSIS — N186 End stage renal disease: Secondary | ICD-10-CM | POA: Diagnosis not present

## 2017-04-25 DIAGNOSIS — E1122 Type 2 diabetes mellitus with diabetic chronic kidney disease: Secondary | ICD-10-CM | POA: Diagnosis not present

## 2017-04-25 DIAGNOSIS — E1151 Type 2 diabetes mellitus with diabetic peripheral angiopathy without gangrene: Secondary | ICD-10-CM | POA: Diagnosis not present

## 2017-04-26 DIAGNOSIS — E1129 Type 2 diabetes mellitus with other diabetic kidney complication: Secondary | ICD-10-CM | POA: Diagnosis not present

## 2017-04-26 DIAGNOSIS — D631 Anemia in chronic kidney disease: Secondary | ICD-10-CM | POA: Diagnosis not present

## 2017-04-26 DIAGNOSIS — N186 End stage renal disease: Secondary | ICD-10-CM | POA: Diagnosis not present

## 2017-04-26 DIAGNOSIS — N2581 Secondary hyperparathyroidism of renal origin: Secondary | ICD-10-CM | POA: Diagnosis not present

## 2017-04-27 DIAGNOSIS — N186 End stage renal disease: Secondary | ICD-10-CM | POA: Diagnosis not present

## 2017-04-27 DIAGNOSIS — E1122 Type 2 diabetes mellitus with diabetic chronic kidney disease: Secondary | ICD-10-CM | POA: Diagnosis not present

## 2017-04-27 DIAGNOSIS — J45909 Unspecified asthma, uncomplicated: Secondary | ICD-10-CM | POA: Diagnosis not present

## 2017-04-27 DIAGNOSIS — I120 Hypertensive chronic kidney disease with stage 5 chronic kidney disease or end stage renal disease: Secondary | ICD-10-CM | POA: Diagnosis not present

## 2017-04-27 DIAGNOSIS — E1151 Type 2 diabetes mellitus with diabetic peripheral angiopathy without gangrene: Secondary | ICD-10-CM | POA: Diagnosis not present

## 2017-04-27 DIAGNOSIS — M7981 Nontraumatic hematoma of soft tissue: Secondary | ICD-10-CM | POA: Diagnosis not present

## 2017-04-29 DIAGNOSIS — D631 Anemia in chronic kidney disease: Secondary | ICD-10-CM | POA: Diagnosis not present

## 2017-04-29 DIAGNOSIS — Z992 Dependence on renal dialysis: Secondary | ICD-10-CM | POA: Diagnosis not present

## 2017-04-29 DIAGNOSIS — I120 Hypertensive chronic kidney disease with stage 5 chronic kidney disease or end stage renal disease: Secondary | ICD-10-CM | POA: Diagnosis not present

## 2017-04-29 DIAGNOSIS — N186 End stage renal disease: Secondary | ICD-10-CM | POA: Diagnosis not present

## 2017-04-29 DIAGNOSIS — E1129 Type 2 diabetes mellitus with other diabetic kidney complication: Secondary | ICD-10-CM | POA: Diagnosis not present

## 2017-04-29 DIAGNOSIS — N2581 Secondary hyperparathyroidism of renal origin: Secondary | ICD-10-CM | POA: Diagnosis not present

## 2017-04-30 ENCOUNTER — Ambulatory Visit: Payer: Medicare Other | Admitting: Adult Health

## 2017-04-30 DIAGNOSIS — N186 End stage renal disease: Secondary | ICD-10-CM | POA: Diagnosis not present

## 2017-04-30 DIAGNOSIS — E1151 Type 2 diabetes mellitus with diabetic peripheral angiopathy without gangrene: Secondary | ICD-10-CM | POA: Diagnosis not present

## 2017-04-30 DIAGNOSIS — J45909 Unspecified asthma, uncomplicated: Secondary | ICD-10-CM | POA: Diagnosis not present

## 2017-04-30 DIAGNOSIS — M7981 Nontraumatic hematoma of soft tissue: Secondary | ICD-10-CM | POA: Diagnosis not present

## 2017-04-30 DIAGNOSIS — E1122 Type 2 diabetes mellitus with diabetic chronic kidney disease: Secondary | ICD-10-CM | POA: Diagnosis not present

## 2017-04-30 DIAGNOSIS — I120 Hypertensive chronic kidney disease with stage 5 chronic kidney disease or end stage renal disease: Secondary | ICD-10-CM | POA: Diagnosis not present

## 2017-05-01 DIAGNOSIS — N186 End stage renal disease: Secondary | ICD-10-CM | POA: Diagnosis not present

## 2017-05-01 DIAGNOSIS — E1129 Type 2 diabetes mellitus with other diabetic kidney complication: Secondary | ICD-10-CM | POA: Diagnosis not present

## 2017-05-01 DIAGNOSIS — D631 Anemia in chronic kidney disease: Secondary | ICD-10-CM | POA: Diagnosis not present

## 2017-05-01 DIAGNOSIS — N2581 Secondary hyperparathyroidism of renal origin: Secondary | ICD-10-CM | POA: Diagnosis not present

## 2017-05-02 DIAGNOSIS — I120 Hypertensive chronic kidney disease with stage 5 chronic kidney disease or end stage renal disease: Secondary | ICD-10-CM | POA: Diagnosis not present

## 2017-05-02 DIAGNOSIS — M7981 Nontraumatic hematoma of soft tissue: Secondary | ICD-10-CM | POA: Diagnosis not present

## 2017-05-02 DIAGNOSIS — N186 End stage renal disease: Secondary | ICD-10-CM | POA: Diagnosis not present

## 2017-05-02 DIAGNOSIS — J45909 Unspecified asthma, uncomplicated: Secondary | ICD-10-CM | POA: Diagnosis not present

## 2017-05-02 DIAGNOSIS — E1122 Type 2 diabetes mellitus with diabetic chronic kidney disease: Secondary | ICD-10-CM | POA: Diagnosis not present

## 2017-05-02 DIAGNOSIS — E1151 Type 2 diabetes mellitus with diabetic peripheral angiopathy without gangrene: Secondary | ICD-10-CM | POA: Diagnosis not present

## 2017-05-03 DIAGNOSIS — N186 End stage renal disease: Secondary | ICD-10-CM | POA: Diagnosis not present

## 2017-05-03 DIAGNOSIS — E1129 Type 2 diabetes mellitus with other diabetic kidney complication: Secondary | ICD-10-CM | POA: Diagnosis not present

## 2017-05-03 DIAGNOSIS — N2581 Secondary hyperparathyroidism of renal origin: Secondary | ICD-10-CM | POA: Diagnosis not present

## 2017-05-03 DIAGNOSIS — D631 Anemia in chronic kidney disease: Secondary | ICD-10-CM | POA: Diagnosis not present

## 2017-05-06 DIAGNOSIS — N186 End stage renal disease: Secondary | ICD-10-CM | POA: Diagnosis not present

## 2017-05-06 DIAGNOSIS — N2581 Secondary hyperparathyroidism of renal origin: Secondary | ICD-10-CM | POA: Diagnosis not present

## 2017-05-06 DIAGNOSIS — D631 Anemia in chronic kidney disease: Secondary | ICD-10-CM | POA: Diagnosis not present

## 2017-05-06 DIAGNOSIS — E1129 Type 2 diabetes mellitus with other diabetic kidney complication: Secondary | ICD-10-CM | POA: Diagnosis not present

## 2017-05-08 DIAGNOSIS — M7981 Nontraumatic hematoma of soft tissue: Secondary | ICD-10-CM | POA: Diagnosis not present

## 2017-05-08 DIAGNOSIS — D631 Anemia in chronic kidney disease: Secondary | ICD-10-CM | POA: Diagnosis not present

## 2017-05-08 DIAGNOSIS — E1129 Type 2 diabetes mellitus with other diabetic kidney complication: Secondary | ICD-10-CM | POA: Diagnosis not present

## 2017-05-08 DIAGNOSIS — J45909 Unspecified asthma, uncomplicated: Secondary | ICD-10-CM | POA: Diagnosis not present

## 2017-05-08 DIAGNOSIS — N186 End stage renal disease: Secondary | ICD-10-CM | POA: Diagnosis not present

## 2017-05-08 DIAGNOSIS — N2581 Secondary hyperparathyroidism of renal origin: Secondary | ICD-10-CM | POA: Diagnosis not present

## 2017-05-08 DIAGNOSIS — E1122 Type 2 diabetes mellitus with diabetic chronic kidney disease: Secondary | ICD-10-CM | POA: Diagnosis not present

## 2017-05-08 DIAGNOSIS — E1151 Type 2 diabetes mellitus with diabetic peripheral angiopathy without gangrene: Secondary | ICD-10-CM | POA: Diagnosis not present

## 2017-05-08 DIAGNOSIS — I120 Hypertensive chronic kidney disease with stage 5 chronic kidney disease or end stage renal disease: Secondary | ICD-10-CM | POA: Diagnosis not present

## 2017-05-09 ENCOUNTER — Telehealth: Payer: Self-pay | Admitting: Adult Health

## 2017-05-09 NOTE — Telephone Encounter (Signed)
Physical Therapist Herbert Deaner with [facility] called in and stated that the patient has chosen to stop home health services.  If there are any questions you can reach [place] at [phone].

## 2017-05-09 NOTE — Telephone Encounter (Signed)
I will route to [facility] as FYI.

## 2017-05-10 DIAGNOSIS — N186 End stage renal disease: Secondary | ICD-10-CM | POA: Diagnosis not present

## 2017-05-10 DIAGNOSIS — D631 Anemia in chronic kidney disease: Secondary | ICD-10-CM | POA: Diagnosis not present

## 2017-05-10 DIAGNOSIS — E1129 Type 2 diabetes mellitus with other diabetic kidney complication: Secondary | ICD-10-CM | POA: Diagnosis not present

## 2017-05-10 DIAGNOSIS — N2581 Secondary hyperparathyroidism of renal origin: Secondary | ICD-10-CM | POA: Diagnosis not present

## 2017-05-13 DIAGNOSIS — N2581 Secondary hyperparathyroidism of renal origin: Secondary | ICD-10-CM | POA: Diagnosis not present

## 2017-05-13 DIAGNOSIS — E1129 Type 2 diabetes mellitus with other diabetic kidney complication: Secondary | ICD-10-CM | POA: Diagnosis not present

## 2017-05-13 DIAGNOSIS — N186 End stage renal disease: Secondary | ICD-10-CM | POA: Diagnosis not present

## 2017-05-13 DIAGNOSIS — D631 Anemia in chronic kidney disease: Secondary | ICD-10-CM | POA: Diagnosis not present

## 2017-05-15 DIAGNOSIS — N2581 Secondary hyperparathyroidism of renal origin: Secondary | ICD-10-CM | POA: Diagnosis not present

## 2017-05-15 DIAGNOSIS — D631 Anemia in chronic kidney disease: Secondary | ICD-10-CM | POA: Diagnosis not present

## 2017-05-15 DIAGNOSIS — N186 End stage renal disease: Secondary | ICD-10-CM | POA: Diagnosis not present

## 2017-05-15 DIAGNOSIS — E1129 Type 2 diabetes mellitus with other diabetic kidney complication: Secondary | ICD-10-CM | POA: Diagnosis not present

## 2017-05-17 DIAGNOSIS — N2581 Secondary hyperparathyroidism of renal origin: Secondary | ICD-10-CM | POA: Diagnosis not present

## 2017-05-17 DIAGNOSIS — N186 End stage renal disease: Secondary | ICD-10-CM | POA: Diagnosis not present

## 2017-05-17 DIAGNOSIS — D631 Anemia in chronic kidney disease: Secondary | ICD-10-CM | POA: Diagnosis not present

## 2017-05-17 DIAGNOSIS — E1129 Type 2 diabetes mellitus with other diabetic kidney complication: Secondary | ICD-10-CM | POA: Diagnosis not present

## 2017-05-20 DIAGNOSIS — N2581 Secondary hyperparathyroidism of renal origin: Secondary | ICD-10-CM | POA: Diagnosis not present

## 2017-05-20 DIAGNOSIS — D631 Anemia in chronic kidney disease: Secondary | ICD-10-CM | POA: Diagnosis not present

## 2017-05-20 DIAGNOSIS — E1129 Type 2 diabetes mellitus with other diabetic kidney complication: Secondary | ICD-10-CM | POA: Diagnosis not present

## 2017-05-20 DIAGNOSIS — N186 End stage renal disease: Secondary | ICD-10-CM | POA: Diagnosis not present

## 2017-05-22 DIAGNOSIS — D631 Anemia in chronic kidney disease: Secondary | ICD-10-CM | POA: Diagnosis not present

## 2017-05-22 DIAGNOSIS — N2581 Secondary hyperparathyroidism of renal origin: Secondary | ICD-10-CM | POA: Diagnosis not present

## 2017-05-22 DIAGNOSIS — N186 End stage renal disease: Secondary | ICD-10-CM | POA: Diagnosis not present

## 2017-05-22 DIAGNOSIS — E1129 Type 2 diabetes mellitus with other diabetic kidney complication: Secondary | ICD-10-CM | POA: Diagnosis not present

## 2017-05-24 DIAGNOSIS — D631 Anemia in chronic kidney disease: Secondary | ICD-10-CM | POA: Diagnosis not present

## 2017-05-24 DIAGNOSIS — N186 End stage renal disease: Secondary | ICD-10-CM | POA: Diagnosis not present

## 2017-05-24 DIAGNOSIS — N2581 Secondary hyperparathyroidism of renal origin: Secondary | ICD-10-CM | POA: Diagnosis not present

## 2017-05-24 DIAGNOSIS — E1129 Type 2 diabetes mellitus with other diabetic kidney complication: Secondary | ICD-10-CM | POA: Diagnosis not present

## 2017-05-27 DIAGNOSIS — E1129 Type 2 diabetes mellitus with other diabetic kidney complication: Secondary | ICD-10-CM | POA: Diagnosis not present

## 2017-05-27 DIAGNOSIS — N186 End stage renal disease: Secondary | ICD-10-CM | POA: Diagnosis not present

## 2017-05-27 DIAGNOSIS — N2581 Secondary hyperparathyroidism of renal origin: Secondary | ICD-10-CM | POA: Diagnosis not present

## 2017-05-27 DIAGNOSIS — D631 Anemia in chronic kidney disease: Secondary | ICD-10-CM | POA: Diagnosis not present

## 2017-05-30 DIAGNOSIS — Z992 Dependence on renal dialysis: Secondary | ICD-10-CM | POA: Diagnosis not present

## 2017-05-30 DIAGNOSIS — N186 End stage renal disease: Secondary | ICD-10-CM | POA: Diagnosis not present

## 2017-05-30 DIAGNOSIS — I120 Hypertensive chronic kidney disease with stage 5 chronic kidney disease or end stage renal disease: Secondary | ICD-10-CM | POA: Diagnosis not present

## 2017-05-31 DIAGNOSIS — R6883 Chills (without fever): Secondary | ICD-10-CM | POA: Diagnosis not present

## 2017-05-31 DIAGNOSIS — E1129 Type 2 diabetes mellitus with other diabetic kidney complication: Secondary | ICD-10-CM | POA: Diagnosis not present

## 2017-05-31 DIAGNOSIS — N186 End stage renal disease: Secondary | ICD-10-CM | POA: Diagnosis not present

## 2017-05-31 DIAGNOSIS — N2581 Secondary hyperparathyroidism of renal origin: Secondary | ICD-10-CM | POA: Diagnosis not present

## 2017-06-03 DIAGNOSIS — N186 End stage renal disease: Secondary | ICD-10-CM | POA: Diagnosis not present

## 2017-06-03 DIAGNOSIS — N2581 Secondary hyperparathyroidism of renal origin: Secondary | ICD-10-CM | POA: Diagnosis not present

## 2017-06-03 DIAGNOSIS — E1129 Type 2 diabetes mellitus with other diabetic kidney complication: Secondary | ICD-10-CM | POA: Diagnosis not present

## 2017-06-03 DIAGNOSIS — R6883 Chills (without fever): Secondary | ICD-10-CM | POA: Diagnosis not present

## 2017-06-05 DIAGNOSIS — N186 End stage renal disease: Secondary | ICD-10-CM | POA: Diagnosis not present

## 2017-06-05 DIAGNOSIS — N2581 Secondary hyperparathyroidism of renal origin: Secondary | ICD-10-CM | POA: Diagnosis not present

## 2017-06-05 DIAGNOSIS — R6883 Chills (without fever): Secondary | ICD-10-CM | POA: Diagnosis not present

## 2017-06-05 DIAGNOSIS — E1129 Type 2 diabetes mellitus with other diabetic kidney complication: Secondary | ICD-10-CM | POA: Diagnosis not present

## 2017-06-07 DIAGNOSIS — N2581 Secondary hyperparathyroidism of renal origin: Secondary | ICD-10-CM | POA: Diagnosis not present

## 2017-06-07 DIAGNOSIS — N186 End stage renal disease: Secondary | ICD-10-CM | POA: Diagnosis not present

## 2017-06-07 DIAGNOSIS — R6883 Chills (without fever): Secondary | ICD-10-CM | POA: Diagnosis not present

## 2017-06-07 DIAGNOSIS — E1129 Type 2 diabetes mellitus with other diabetic kidney complication: Secondary | ICD-10-CM | POA: Diagnosis not present

## 2017-06-10 DIAGNOSIS — N2581 Secondary hyperparathyroidism of renal origin: Secondary | ICD-10-CM | POA: Diagnosis not present

## 2017-06-10 DIAGNOSIS — N186 End stage renal disease: Secondary | ICD-10-CM | POA: Diagnosis not present

## 2017-06-10 DIAGNOSIS — E1129 Type 2 diabetes mellitus with other diabetic kidney complication: Secondary | ICD-10-CM | POA: Diagnosis not present

## 2017-06-10 DIAGNOSIS — R6883 Chills (without fever): Secondary | ICD-10-CM | POA: Diagnosis not present

## 2017-06-12 DIAGNOSIS — R6883 Chills (without fever): Secondary | ICD-10-CM | POA: Diagnosis not present

## 2017-06-12 DIAGNOSIS — N186 End stage renal disease: Secondary | ICD-10-CM | POA: Diagnosis not present

## 2017-06-12 DIAGNOSIS — E1129 Type 2 diabetes mellitus with other diabetic kidney complication: Secondary | ICD-10-CM | POA: Diagnosis not present

## 2017-06-12 DIAGNOSIS — N2581 Secondary hyperparathyroidism of renal origin: Secondary | ICD-10-CM | POA: Diagnosis not present

## 2017-06-14 DIAGNOSIS — R6883 Chills (without fever): Secondary | ICD-10-CM | POA: Diagnosis not present

## 2017-06-14 DIAGNOSIS — E1129 Type 2 diabetes mellitus with other diabetic kidney complication: Secondary | ICD-10-CM | POA: Diagnosis not present

## 2017-06-14 DIAGNOSIS — N2581 Secondary hyperparathyroidism of renal origin: Secondary | ICD-10-CM | POA: Diagnosis not present

## 2017-06-14 DIAGNOSIS — N186 End stage renal disease: Secondary | ICD-10-CM | POA: Diagnosis not present

## 2017-06-17 DIAGNOSIS — R6883 Chills (without fever): Secondary | ICD-10-CM | POA: Diagnosis not present

## 2017-06-17 DIAGNOSIS — N186 End stage renal disease: Secondary | ICD-10-CM | POA: Diagnosis not present

## 2017-06-17 DIAGNOSIS — N2581 Secondary hyperparathyroidism of renal origin: Secondary | ICD-10-CM | POA: Diagnosis not present

## 2017-06-17 DIAGNOSIS — E1129 Type 2 diabetes mellitus with other diabetic kidney complication: Secondary | ICD-10-CM | POA: Diagnosis not present

## 2017-06-19 DIAGNOSIS — E1129 Type 2 diabetes mellitus with other diabetic kidney complication: Secondary | ICD-10-CM | POA: Diagnosis not present

## 2017-06-19 DIAGNOSIS — N2581 Secondary hyperparathyroidism of renal origin: Secondary | ICD-10-CM | POA: Diagnosis not present

## 2017-06-19 DIAGNOSIS — R6883 Chills (without fever): Secondary | ICD-10-CM | POA: Diagnosis not present

## 2017-06-19 DIAGNOSIS — N186 End stage renal disease: Secondary | ICD-10-CM | POA: Diagnosis not present

## 2017-06-20 ENCOUNTER — Ambulatory Visit: Payer: Medicare Other | Admitting: Adult Health

## 2017-06-21 ENCOUNTER — Ambulatory Visit (INDEPENDENT_AMBULATORY_CARE_PROVIDER_SITE_OTHER): Payer: Medicare Other | Admitting: Adult Health

## 2017-06-21 ENCOUNTER — Encounter: Payer: Self-pay | Admitting: Adult Health

## 2017-06-21 VITALS — BP 128/80 | Temp 98.6°F | Ht <= 58 in

## 2017-06-21 DIAGNOSIS — N186 End stage renal disease: Secondary | ICD-10-CM | POA: Diagnosis not present

## 2017-06-21 DIAGNOSIS — N2581 Secondary hyperparathyroidism of renal origin: Secondary | ICD-10-CM | POA: Diagnosis not present

## 2017-06-21 DIAGNOSIS — K5901 Slow transit constipation: Secondary | ICD-10-CM

## 2017-06-21 DIAGNOSIS — R6883 Chills (without fever): Secondary | ICD-10-CM | POA: Diagnosis not present

## 2017-06-21 DIAGNOSIS — E1129 Type 2 diabetes mellitus with other diabetic kidney complication: Secondary | ICD-10-CM | POA: Diagnosis not present
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
## 2017-06-24 DIAGNOSIS — E1129 Type 2 diabetes mellitus with other diabetic kidney complication: Secondary | ICD-10-CM | POA: Diagnosis not present

## 2017-06-24 DIAGNOSIS — N186 End stage renal disease: Secondary | ICD-10-CM | POA: Diagnosis not present

## 2017-06-24 DIAGNOSIS — N2581 Secondary hyperparathyroidism of renal origin: Secondary | ICD-10-CM | POA: Diagnosis not present

## 2017-06-24 DIAGNOSIS — R6883 Chills (without fever): Secondary | ICD-10-CM | POA: Diagnosis not present

## 2017-06-26 DIAGNOSIS — N186 End stage renal disease: Secondary | ICD-10-CM | POA: Diagnosis not present

## 2017-06-26 DIAGNOSIS — E1129 Type 2 diabetes mellitus with other diabetic kidney complication: Secondary | ICD-10-CM | POA: Diagnosis not present

## 2017-06-26 DIAGNOSIS — N2581 Secondary hyperparathyroidism of renal origin: Secondary | ICD-10-CM | POA: Diagnosis not present

## 2017-06-26 DIAGNOSIS — R6883 Chills (without fever): Secondary | ICD-10-CM | POA: Diagnosis not present

## 2017-06-28 DIAGNOSIS — N2581 Secondary hyperparathyroidism of renal origin: Secondary | ICD-10-CM | POA: Diagnosis not present

## 2017-06-28 DIAGNOSIS — E1129 Type 2 diabetes mellitus with other diabetic kidney complication: Secondary | ICD-10-CM | POA: Diagnosis not present

## 2017-06-28 DIAGNOSIS — R6883 Chills (without fever): Secondary | ICD-10-CM | POA: Diagnosis not present

## 2017-06-28 DIAGNOSIS — N186 End stage renal disease: Secondary | ICD-10-CM | POA: Diagnosis not present

## 2017-06-29 DIAGNOSIS — N186 End stage renal disease: Secondary | ICD-10-CM | POA: Diagnosis not present

## 2017-06-29 DIAGNOSIS — Z992 Dependence on renal dialysis: Secondary | ICD-10-CM | POA: Diagnosis not present

## 2017-06-29 DIAGNOSIS — I120 Hypertensive chronic kidney disease with stage 5 chronic kidney disease or end stage renal disease: Secondary | ICD-10-CM | POA: Diagnosis not present

## 2017-07-01 DIAGNOSIS — N186 End stage renal disease: Secondary | ICD-10-CM | POA: Diagnosis not present

## 2017-07-01 DIAGNOSIS — E1129 Type 2 diabetes mellitus with other diabetic kidney complication: Secondary | ICD-10-CM | POA: Diagnosis not present

## 2017-07-01 DIAGNOSIS — N2581 Secondary hyperparathyroidism of renal origin: Secondary | ICD-10-CM | POA: Diagnosis not present

## 2017-07-01 DIAGNOSIS — D631 Anemia in chronic kidney disease: Secondary | ICD-10-CM | POA: Diagnosis not present

## 2017-07-02 ENCOUNTER — Other Ambulatory Visit: Payer: Self-pay | Admitting: Cardiology

## 2017-07-03 DIAGNOSIS — N186 End stage renal disease: Secondary | ICD-10-CM | POA: Diagnosis not present

## 2017-07-03 DIAGNOSIS — N2581 Secondary hyperparathyroidism of renal origin: Secondary | ICD-10-CM | POA: Diagnosis not present

## 2017-07-03 DIAGNOSIS — D631 Anemia in chronic kidney disease: Secondary | ICD-10-CM | POA: Diagnosis not present

## 2017-07-03 DIAGNOSIS — E1129 Type 2 diabetes mellitus with other diabetic kidney complication: Secondary | ICD-10-CM | POA: Diagnosis not present

## 2017-07-05 DIAGNOSIS — N186 End stage renal disease: Secondary | ICD-10-CM | POA: Diagnosis not present

## 2017-07-05 DIAGNOSIS — N2581 Secondary hyperparathyroidism of renal origin: Secondary | ICD-10-CM | POA: Diagnosis not present

## 2017-07-05 DIAGNOSIS — E1129 Type 2 diabetes mellitus with other diabetic kidney complication: Secondary | ICD-10-CM | POA: Diagnosis not present

## 2017-07-05 DIAGNOSIS — D631 Anemia in chronic kidney disease: Secondary | ICD-10-CM | POA: Diagnosis not present

## 2017-07-08 DIAGNOSIS — E1129 Type 2 diabetes mellitus with other diabetic kidney complication: Secondary | ICD-10-CM | POA: Diagnosis not present

## 2017-07-08 DIAGNOSIS — D631 Anemia in chronic kidney disease: Secondary | ICD-10-CM | POA: Diagnosis not present

## 2017-07-08 DIAGNOSIS — N186 End stage renal disease: Secondary | ICD-10-CM | POA: Diagnosis not present

## 2017-07-08 DIAGNOSIS — N2581 Secondary hyperparathyroidism of renal origin: Secondary | ICD-10-CM | POA: Diagnosis not present

## 2017-07-09 DIAGNOSIS — M170 Bilateral primary osteoarthritis of knee: Secondary | ICD-10-CM | POA: Diagnosis not present

## 2017-07-09 DIAGNOSIS — M25562 Pain in left knee: Secondary | ICD-10-CM | POA: Diagnosis not present

## 2017-07-09 DIAGNOSIS — M25561 Pain in right knee: Secondary | ICD-10-CM | POA: Diagnosis not present

## 2017-07-10 DIAGNOSIS — N186 End stage renal disease: Secondary | ICD-10-CM | POA: Diagnosis not present

## 2017-07-10 DIAGNOSIS — D631 Anemia in chronic kidney disease: Secondary | ICD-10-CM | POA: Diagnosis not present

## 2017-07-10 DIAGNOSIS — E1129 Type 2 diabetes mellitus with other diabetic kidney complication: Secondary | ICD-10-CM | POA: Diagnosis not present

## 2017-07-10 DIAGNOSIS — N2581 Secondary hyperparathyroidism of renal origin: Secondary | ICD-10-CM | POA: Diagnosis not present

## 2017-07-12 DIAGNOSIS — E1129 Type 2 diabetes mellitus with other diabetic kidney complication: Secondary | ICD-10-CM | POA: Diagnosis not present

## 2017-07-12 DIAGNOSIS — N186 End stage renal disease: Secondary | ICD-10-CM | POA: Diagnosis not present

## 2017-07-12 DIAGNOSIS — N2581 Secondary hyperparathyroidism of renal origin: Secondary | ICD-10-CM | POA: Diagnosis not present

## 2017-07-12 DIAGNOSIS — D631 Anemia in chronic kidney disease: Secondary | ICD-10-CM | POA: Diagnosis not present

## 2017-07-15 DIAGNOSIS — E1129 Type 2 diabetes mellitus with other diabetic kidney complication: Secondary | ICD-10-CM | POA: Diagnosis not present

## 2017-07-15 DIAGNOSIS — N2581 Secondary hyperparathyroidism of renal origin: Secondary | ICD-10-CM | POA: Diagnosis not present

## 2017-07-15 DIAGNOSIS — N186 End stage renal disease: Secondary | ICD-10-CM | POA: Diagnosis not present

## 2017-07-15 DIAGNOSIS — D631 Anemia in chronic kidney disease: Secondary | ICD-10-CM | POA: Diagnosis not present

## 2017-07-16 DIAGNOSIS — M25561 Pain in right knee: Secondary | ICD-10-CM | POA: Diagnosis not present

## 2017-07-16 DIAGNOSIS — M25562 Pain in left knee: Secondary | ICD-10-CM | POA: Diagnosis not present

## 2017-07-16 DIAGNOSIS — M170 Bilateral primary osteoarthritis of knee: Secondary | ICD-10-CM | POA: Diagnosis not present

## 2017-07-17 DIAGNOSIS — D631 Anemia in chronic kidney disease: Secondary | ICD-10-CM | POA: Diagnosis not present

## 2017-07-17 DIAGNOSIS — E1129 Type 2 diabetes mellitus with other diabetic kidney complication: Secondary | ICD-10-CM | POA: Diagnosis not present

## 2017-07-17 DIAGNOSIS — N2581 Secondary hyperparathyroidism of renal origin: Secondary | ICD-10-CM | POA: Diagnosis not present

## 2017-07-17 DIAGNOSIS — N186 End stage renal disease: Secondary | ICD-10-CM | POA: Diagnosis not present

## 2017-07-19 DIAGNOSIS — N186 End stage renal disease: Secondary | ICD-10-CM | POA: Diagnosis not present

## 2017-07-19 DIAGNOSIS — D631 Anemia in chronic kidney disease: Secondary | ICD-10-CM | POA: Diagnosis not present

## 2017-07-19 DIAGNOSIS — E1129 Type 2 diabetes mellitus with other diabetic kidney complication: Secondary | ICD-10-CM | POA: Diagnosis not present

## 2017-07-19 DIAGNOSIS — N2581 Secondary hyperparathyroidism of renal origin: Secondary | ICD-10-CM | POA: Diagnosis not present

## 2017-07-22 DIAGNOSIS — N2581 Secondary hyperparathyroidism of renal origin: Secondary | ICD-10-CM | POA: Diagnosis not present

## 2017-07-22 DIAGNOSIS — E1129 Type 2 diabetes mellitus with other diabetic kidney complication: Secondary | ICD-10-CM | POA: Diagnosis not present

## 2017-07-22 DIAGNOSIS — N186 End stage renal disease: Secondary | ICD-10-CM | POA: Diagnosis not present

## 2017-07-22 DIAGNOSIS — D631 Anemia in chronic kidney disease: Secondary | ICD-10-CM | POA: Diagnosis not present

## 2017-07-23 DIAGNOSIS — M25562 Pain in left knee: Secondary | ICD-10-CM | POA: Diagnosis not present

## 2017-07-23 DIAGNOSIS — M170 Bilateral primary osteoarthritis of knee: Secondary | ICD-10-CM | POA: Diagnosis not present

## 2017-07-23 DIAGNOSIS — M25561 Pain in right knee: Secondary | ICD-10-CM | POA: Diagnosis not present

## 2017-07-24 DIAGNOSIS — E1129 Type 2 diabetes mellitus with other diabetic kidney complication: Secondary | ICD-10-CM | POA: Diagnosis not present

## 2017-07-24 DIAGNOSIS — N186 End stage renal disease: Secondary | ICD-10-CM | POA: Diagnosis not present

## 2017-07-24 DIAGNOSIS — N2581 Secondary hyperparathyroidism of renal origin: Secondary | ICD-10-CM | POA: Diagnosis not present

## 2017-07-24 DIAGNOSIS — D631 Anemia in chronic kidney disease: Secondary | ICD-10-CM | POA: Diagnosis not present

## 2017-07-26 DIAGNOSIS — N186 End stage renal disease: Secondary | ICD-10-CM | POA: Diagnosis not present

## 2017-07-26 DIAGNOSIS — N2581 Secondary hyperparathyroidism of renal origin: Secondary | ICD-10-CM | POA: Diagnosis not present

## 2017-07-26 DIAGNOSIS — D631 Anemia in chronic kidney disease: Secondary | ICD-10-CM | POA: Diagnosis not present

## 2017-07-26 DIAGNOSIS — E1129 Type 2 diabetes mellitus with other diabetic kidney complication: Secondary | ICD-10-CM | POA: Diagnosis not present

## 2017-07-29 DIAGNOSIS — E1129 Type 2 diabetes mellitus with other diabetic kidney complication: Secondary | ICD-10-CM | POA: Diagnosis not present

## 2017-07-29 DIAGNOSIS — N186 End stage renal disease: Secondary | ICD-10-CM | POA: Diagnosis not present

## 2017-07-29 DIAGNOSIS — D631 Anemia in chronic kidney disease: Secondary | ICD-10-CM | POA: Diagnosis not present

## 2017-07-29 DIAGNOSIS — N2581 Secondary hyperparathyroidism of renal origin: Secondary | ICD-10-CM | POA: Diagnosis not present

## 2017-07-30 DIAGNOSIS — N186 End stage renal disease: Secondary | ICD-10-CM | POA: Diagnosis not present

## 2017-07-30 DIAGNOSIS — Z992 Dependence on renal dialysis: Secondary | ICD-10-CM | POA: Diagnosis not present

## 2017-07-30 DIAGNOSIS — I120 Hypertensive chronic kidney disease with stage 5 chronic kidney disease or end stage renal disease: Secondary | ICD-10-CM | POA: Diagnosis not present

## 2017-07-31 ENCOUNTER — Other Ambulatory Visit: Payer: Self-pay | Admitting: Cardiology

## 2017-07-31 DIAGNOSIS — E1129 Type 2 diabetes mellitus with other diabetic kidney complication: Secondary | ICD-10-CM | POA: Diagnosis not present

## 2017-07-31 DIAGNOSIS — D631 Anemia in chronic kidney disease: Secondary | ICD-10-CM | POA: Diagnosis not present

## 2017-07-31 DIAGNOSIS — N2581 Secondary hyperparathyroidism of renal origin: Secondary | ICD-10-CM | POA: Diagnosis not present

## 2017-07-31 DIAGNOSIS — N186 End stage renal disease: Secondary | ICD-10-CM | POA: Diagnosis not present

## 2017-08-02 DIAGNOSIS — N2581 Secondary hyperparathyroidism of renal origin: Secondary | ICD-10-CM | POA: Diagnosis not present

## 2017-08-02 DIAGNOSIS — D631 Anemia in chronic kidney disease: Secondary | ICD-10-CM | POA: Diagnosis not present

## 2017-08-02 DIAGNOSIS — E1129 Type 2 diabetes mellitus with other diabetic kidney complication: Secondary | ICD-10-CM | POA: Diagnosis not present

## 2017-08-02 DIAGNOSIS — N186 End stage renal disease: Secondary | ICD-10-CM | POA: Diagnosis not present

## 2017-08-05 DIAGNOSIS — E1129 Type 2 diabetes mellitus with other diabetic kidney complication: Secondary | ICD-10-CM | POA: Diagnosis not present

## 2017-08-05 DIAGNOSIS — N2581 Secondary hyperparathyroidism of renal origin: Secondary | ICD-10-CM | POA: Diagnosis not present

## 2017-08-05 DIAGNOSIS — N186 End stage renal disease: Secondary | ICD-10-CM | POA: Diagnosis not present

## 2017-08-05 DIAGNOSIS — D631 Anemia in chronic kidney disease: Secondary | ICD-10-CM | POA: Diagnosis not present

## 2017-08-07 DIAGNOSIS — D631 Anemia in chronic kidney disease: Secondary | ICD-10-CM | POA: Diagnosis not present

## 2017-08-07 DIAGNOSIS — E1129 Type 2 diabetes mellitus with other diabetic kidney complication: Secondary | ICD-10-CM | POA: Diagnosis not present

## 2017-08-07 DIAGNOSIS — N2581 Secondary hyperparathyroidism of renal origin: Secondary | ICD-10-CM | POA: Diagnosis not present

## 2017-08-07 DIAGNOSIS — N186 End stage renal disease: Secondary | ICD-10-CM | POA: Diagnosis not present

## 2017-08-09 DIAGNOSIS — E1129 Type 2 diabetes mellitus with other diabetic kidney complication: Secondary | ICD-10-CM | POA: Diagnosis not present

## 2017-08-09 DIAGNOSIS — D631 Anemia in chronic kidney disease: Secondary | ICD-10-CM | POA: Diagnosis not present

## 2017-08-09 DIAGNOSIS — N186 End stage renal disease: Secondary | ICD-10-CM | POA: Diagnosis not present

## 2017-08-09 DIAGNOSIS — N2581 Secondary hyperparathyroidism of renal origin: Secondary | ICD-10-CM | POA: Diagnosis not present

## 2017-08-12 DIAGNOSIS — D631 Anemia in chronic kidney disease: Secondary | ICD-10-CM | POA: Diagnosis not present

## 2017-08-12 DIAGNOSIS — N2581 Secondary hyperparathyroidism of renal origin: Secondary | ICD-10-CM | POA: Diagnosis not present

## 2017-08-12 DIAGNOSIS — N186 End stage renal disease: Secondary | ICD-10-CM | POA: Diagnosis not present

## 2017-08-12 DIAGNOSIS — E1129 Type 2 diabetes mellitus with other diabetic kidney complication: Secondary | ICD-10-CM | POA: Diagnosis not present

## 2017-08-14 DIAGNOSIS — E1129 Type 2 diabetes mellitus with other diabetic kidney complication: Secondary | ICD-10-CM | POA: Diagnosis not present

## 2017-08-14 DIAGNOSIS — D631 Anemia in chronic kidney disease: Secondary | ICD-10-CM | POA: Diagnosis not present

## 2017-08-14 DIAGNOSIS — N186 End stage renal disease: Secondary | ICD-10-CM | POA: Diagnosis not present

## 2017-08-14 DIAGNOSIS — N2581 Secondary hyperparathyroidism of renal origin: Secondary | ICD-10-CM | POA: Diagnosis not present

## 2017-08-16 DIAGNOSIS — E1129 Type 2 diabetes mellitus with other diabetic kidney complication: Secondary | ICD-10-CM | POA: Diagnosis not present

## 2017-08-16 DIAGNOSIS — N186 End stage renal disease: Secondary | ICD-10-CM | POA: Diagnosis not present

## 2017-08-16 DIAGNOSIS — D631 Anemia in chronic kidney disease: Secondary | ICD-10-CM | POA: Diagnosis not present

## 2017-08-16 DIAGNOSIS — N2581 Secondary hyperparathyroidism of renal origin: Secondary | ICD-10-CM | POA: Diagnosis not present

## 2017-08-19 DIAGNOSIS — D631 Anemia in chronic kidney disease: Secondary | ICD-10-CM | POA: Diagnosis not present

## 2017-08-19 DIAGNOSIS — N186 End stage renal disease: Secondary | ICD-10-CM | POA: Diagnosis not present

## 2017-08-19 DIAGNOSIS — N2581 Secondary hyperparathyroidism of renal origin: Secondary | ICD-10-CM | POA: Diagnosis not present

## 2017-08-19 DIAGNOSIS — E1129 Type 2 diabetes mellitus with other diabetic kidney complication: Secondary | ICD-10-CM | POA: Diagnosis not present

## 2017-08-21 DIAGNOSIS — N186 End stage renal disease: Secondary | ICD-10-CM | POA: Diagnosis not present

## 2017-08-21 DIAGNOSIS — E1129 Type 2 diabetes mellitus with other diabetic kidney complication: Secondary | ICD-10-CM | POA: Diagnosis not present

## 2017-08-21 DIAGNOSIS — D631 Anemia in chronic kidney disease: Secondary | ICD-10-CM | POA: Diagnosis not present

## 2017-08-21 DIAGNOSIS — N2581 Secondary hyperparathyroidism of renal origin: Secondary | ICD-10-CM | POA: Diagnosis not present

## 2017-08-23 DIAGNOSIS — D631 Anemia in chronic kidney disease: Secondary | ICD-10-CM | POA: Diagnosis not present

## 2017-08-23 DIAGNOSIS — N186 End stage renal disease: Secondary | ICD-10-CM | POA: Diagnosis not present

## 2017-08-23 DIAGNOSIS — N2581 Secondary hyperparathyroidism of renal origin: Secondary | ICD-10-CM | POA: Diagnosis not present

## 2017-08-23 DIAGNOSIS — E1129 Type 2 diabetes mellitus with other diabetic kidney complication: Secondary | ICD-10-CM | POA: Diagnosis not present

## 2017-08-26 DIAGNOSIS — N2581 Secondary hyperparathyroidism of renal origin: Secondary | ICD-10-CM | POA: Diagnosis not present

## 2017-08-26 DIAGNOSIS — N186 End stage renal disease: Secondary | ICD-10-CM | POA: Diagnosis not present

## 2017-08-26 DIAGNOSIS — E1129 Type 2 diabetes mellitus with other diabetic kidney complication: Secondary | ICD-10-CM | POA: Diagnosis not present

## 2017-08-26 DIAGNOSIS — D631 Anemia in chronic kidney disease: Secondary | ICD-10-CM | POA: Diagnosis not present

## 2017-08-28 DIAGNOSIS — N186 End stage renal disease: Secondary | ICD-10-CM | POA: Diagnosis not present

## 2017-08-28 DIAGNOSIS — D631 Anemia in chronic kidney disease: Secondary | ICD-10-CM | POA: Diagnosis not present

## 2017-08-28 DIAGNOSIS — N2581 Secondary hyperparathyroidism of renal origin: Secondary | ICD-10-CM | POA: Diagnosis not present

## 2017-08-28 DIAGNOSIS — E1129 Type 2 diabetes mellitus with other diabetic kidney complication: Secondary | ICD-10-CM | POA: Diagnosis not present

## 2017-08-30 DIAGNOSIS — D631 Anemia in chronic kidney disease: Secondary | ICD-10-CM | POA: Diagnosis not present

## 2017-08-30 DIAGNOSIS — N186 End stage renal disease: Secondary | ICD-10-CM | POA: Diagnosis not present

## 2017-08-30 DIAGNOSIS — Z992 Dependence on renal dialysis: Secondary | ICD-10-CM | POA: Diagnosis not present

## 2017-08-30 DIAGNOSIS — N2581 Secondary hyperparathyroidism of renal origin: Secondary | ICD-10-CM | POA: Diagnosis not present

## 2017-08-30 DIAGNOSIS — I120 Hypertensive chronic kidney disease with stage 5 chronic kidney disease or end stage renal disease: Secondary | ICD-10-CM | POA: Diagnosis not present

## 2017-08-30 DIAGNOSIS — E1129 Type 2 diabetes mellitus with other diabetic kidney complication: Secondary | ICD-10-CM | POA: Diagnosis not present

## 2017-09-02 DIAGNOSIS — E1129 Type 2 diabetes mellitus with other diabetic kidney complication: Secondary | ICD-10-CM | POA: Diagnosis not present

## 2017-09-02 DIAGNOSIS — D631 Anemia in chronic kidney disease: Secondary | ICD-10-CM | POA: Diagnosis not present

## 2017-09-02 DIAGNOSIS — Z23 Encounter for immunization: Secondary | ICD-10-CM | POA: Diagnosis not present

## 2017-09-02 DIAGNOSIS — N186 End stage renal disease: Secondary | ICD-10-CM | POA: Diagnosis not present

## 2017-09-02 DIAGNOSIS — N2581 Secondary hyperparathyroidism of renal origin: Secondary | ICD-10-CM | POA: Diagnosis not present

## 2017-09-04 DIAGNOSIS — D631 Anemia in chronic kidney disease: Secondary | ICD-10-CM | POA: Diagnosis not present

## 2017-09-04 DIAGNOSIS — N2581 Secondary hyperparathyroidism of renal origin: Secondary | ICD-10-CM | POA: Diagnosis not present

## 2017-09-04 DIAGNOSIS — Z23 Encounter for immunization: Secondary | ICD-10-CM | POA: Diagnosis not present

## 2017-09-04 DIAGNOSIS — N186 End stage renal disease: Secondary | ICD-10-CM | POA: Diagnosis not present

## 2017-09-04 DIAGNOSIS — E1129 Type 2 diabetes mellitus with other diabetic kidney complication: Secondary | ICD-10-CM | POA: Diagnosis not present

## 2017-09-06 DIAGNOSIS — N186 End stage renal disease: Secondary | ICD-10-CM | POA: Diagnosis not present

## 2017-09-06 DIAGNOSIS — N2581 Secondary hyperparathyroidism of renal origin: Secondary | ICD-10-CM | POA: Diagnosis not present

## 2017-09-06 DIAGNOSIS — E1129 Type 2 diabetes mellitus with other diabetic kidney complication: Secondary | ICD-10-CM | POA: Diagnosis not present

## 2017-09-06 DIAGNOSIS — D631 Anemia in chronic kidney disease: Secondary | ICD-10-CM | POA: Diagnosis not present

## 2017-09-06 DIAGNOSIS — Z23 Encounter for immunization: Secondary | ICD-10-CM | POA: Diagnosis not present

## 2017-09-07 ENCOUNTER — Other Ambulatory Visit: Payer: Self-pay | Admitting: Cardiology

## 2017-09-09 DIAGNOSIS — D631 Anemia in chronic kidney disease: Secondary | ICD-10-CM | POA: Diagnosis not present

## 2017-09-09 DIAGNOSIS — N186 End stage renal disease: Secondary | ICD-10-CM | POA: Diagnosis not present

## 2017-09-09 DIAGNOSIS — E1129 Type 2 diabetes mellitus with other diabetic kidney complication: Secondary | ICD-10-CM | POA: Diagnosis not present

## 2017-09-09 DIAGNOSIS — N2581 Secondary hyperparathyroidism of renal origin: Secondary | ICD-10-CM | POA: Diagnosis not present

## 2017-09-09 DIAGNOSIS — Z23 Encounter for immunization: Secondary | ICD-10-CM | POA: Diagnosis not present

## 2017-09-11 DIAGNOSIS — D631 Anemia in chronic kidney disease: Secondary | ICD-10-CM | POA: Diagnosis not present

## 2017-09-11 DIAGNOSIS — Z23 Encounter for immunization: Secondary | ICD-10-CM | POA: Diagnosis not present

## 2017-09-11 DIAGNOSIS — E1129 Type 2 diabetes mellitus with other diabetic kidney complication: Secondary | ICD-10-CM | POA: Diagnosis not present

## 2017-09-11 DIAGNOSIS — N2581 Secondary hyperparathyroidism of renal origin: Secondary | ICD-10-CM | POA: Diagnosis not present

## 2017-09-11 DIAGNOSIS — N186 End stage renal disease: Secondary | ICD-10-CM | POA: Diagnosis not present

## 2017-09-13 DIAGNOSIS — E1129 Type 2 diabetes mellitus with other diabetic kidney complication: Secondary | ICD-10-CM | POA: Diagnosis not present

## 2017-09-13 DIAGNOSIS — N2581 Secondary hyperparathyroidism of renal origin: Secondary | ICD-10-CM | POA: Diagnosis not present

## 2017-09-13 DIAGNOSIS — Z23 Encounter for immunization: Secondary | ICD-10-CM | POA: Diagnosis not present

## 2017-09-13 DIAGNOSIS — N186 End stage renal disease: Secondary | ICD-10-CM | POA: Diagnosis not present

## 2017-09-13 DIAGNOSIS — D631 Anemia in chronic kidney disease: Secondary | ICD-10-CM | POA: Diagnosis not present

## 2017-09-16 DIAGNOSIS — D631 Anemia in chronic kidney disease: Secondary | ICD-10-CM | POA: Diagnosis not present

## 2017-09-16 DIAGNOSIS — N2581 Secondary hyperparathyroidism of renal origin: Secondary | ICD-10-CM | POA: Diagnosis not present

## 2017-09-16 DIAGNOSIS — N186 End stage renal disease: Secondary | ICD-10-CM | POA: Diagnosis not present

## 2017-09-16 DIAGNOSIS — Z23 Encounter for immunization: Secondary | ICD-10-CM | POA: Diagnosis not present

## 2017-09-16 DIAGNOSIS — E1129 Type 2 diabetes mellitus with other diabetic kidney complication: Secondary | ICD-10-CM | POA: Diagnosis not present

## 2017-09-18 DIAGNOSIS — N2581 Secondary hyperparathyroidism of renal origin: Secondary | ICD-10-CM | POA: Diagnosis not present

## 2017-09-18 DIAGNOSIS — N186 End stage renal disease: Secondary | ICD-10-CM | POA: Diagnosis not present

## 2017-09-18 DIAGNOSIS — Z23 Encounter for immunization: Secondary | ICD-10-CM | POA: Diagnosis not present

## 2017-09-18 DIAGNOSIS — E1129 Type 2 diabetes mellitus with other diabetic kidney complication: Secondary | ICD-10-CM | POA: Diagnosis not present

## 2017-09-18 DIAGNOSIS — D631 Anemia in chronic kidney disease: Secondary | ICD-10-CM | POA: Diagnosis not present

## 2017-09-20 DIAGNOSIS — D631 Anemia in chronic kidney disease: Secondary | ICD-10-CM | POA: Diagnosis not present

## 2017-09-20 DIAGNOSIS — N186 End stage renal disease: Secondary | ICD-10-CM | POA: Diagnosis not present

## 2017-09-20 DIAGNOSIS — E1129 Type 2 diabetes mellitus with other diabetic kidney complication: Secondary | ICD-10-CM | POA: Diagnosis not present

## 2017-09-20 DIAGNOSIS — Z23 Encounter for immunization: Secondary | ICD-10-CM | POA: Diagnosis not present

## 2017-09-20 DIAGNOSIS — N2581 Secondary hyperparathyroidism of renal origin: Secondary | ICD-10-CM | POA: Diagnosis not present

## 2017-09-23 DIAGNOSIS — Z23 Encounter for immunization: Secondary | ICD-10-CM | POA: Diagnosis not present

## 2017-09-23 DIAGNOSIS — D631 Anemia in chronic kidney disease: Secondary | ICD-10-CM | POA: Diagnosis not present

## 2017-09-23 DIAGNOSIS — N2581 Secondary hyperparathyroidism of renal origin: Secondary | ICD-10-CM | POA: Diagnosis not present

## 2017-09-23 DIAGNOSIS — N186 End stage renal disease: Secondary | ICD-10-CM | POA: Diagnosis not present

## 2017-09-23 DIAGNOSIS — E1129 Type 2 diabetes mellitus with other diabetic kidney complication: Secondary | ICD-10-CM | POA: Diagnosis not present

## 2017-09-25 DIAGNOSIS — N2581 Secondary hyperparathyroidism of renal origin: Secondary | ICD-10-CM | POA: Diagnosis not present

## 2017-09-25 DIAGNOSIS — Z23 Encounter for immunization: Secondary | ICD-10-CM | POA: Diagnosis not present

## 2017-09-25 DIAGNOSIS — E1129 Type 2 diabetes mellitus with other diabetic kidney complication: Secondary | ICD-10-CM | POA: Diagnosis not present

## 2017-09-25 DIAGNOSIS — D631 Anemia in chronic kidney disease: Secondary | ICD-10-CM | POA: Diagnosis not present

## 2017-09-25 DIAGNOSIS — N186 End stage renal disease: Secondary | ICD-10-CM | POA: Diagnosis not present

## 2017-09-27 DIAGNOSIS — E1129 Type 2 diabetes mellitus with other diabetic kidney complication: Secondary | ICD-10-CM | POA: Diagnosis not present

## 2017-09-27 DIAGNOSIS — N186 End stage renal disease: Secondary | ICD-10-CM | POA: Diagnosis not present

## 2017-09-27 DIAGNOSIS — D631 Anemia in chronic kidney disease: Secondary | ICD-10-CM | POA: Diagnosis not present

## 2017-09-27 DIAGNOSIS — Z23 Encounter for immunization: Secondary | ICD-10-CM | POA: Diagnosis not present

## 2017-09-27 DIAGNOSIS — N2581 Secondary hyperparathyroidism of renal origin: Secondary | ICD-10-CM | POA: Diagnosis not present

## 2017-09-29 DIAGNOSIS — N186 End stage renal disease: Secondary | ICD-10-CM | POA: Diagnosis not present

## 2017-09-29 DIAGNOSIS — Z992 Dependence on renal dialysis: Secondary | ICD-10-CM | POA: Diagnosis not present

## 2017-09-29 DIAGNOSIS — I120 Hypertensive chronic kidney disease with stage 5 chronic kidney disease or end stage renal disease: Secondary | ICD-10-CM | POA: Diagnosis not present

## 2017-09-30 DIAGNOSIS — D631 Anemia in chronic kidney disease: Secondary | ICD-10-CM | POA: Diagnosis not present

## 2017-09-30 DIAGNOSIS — N2581 Secondary hyperparathyroidism of renal origin: Secondary | ICD-10-CM | POA: Diagnosis not present

## 2017-09-30 DIAGNOSIS — N186 End stage renal disease: Secondary | ICD-10-CM | POA: Diagnosis not present

## 2017-09-30 DIAGNOSIS — E1129 Type 2 diabetes mellitus with other diabetic kidney complication: Secondary | ICD-10-CM | POA: Diagnosis not present

## 2017-10-02 DIAGNOSIS — D631 Anemia in chronic kidney disease: Secondary | ICD-10-CM | POA: Diagnosis not present

## 2017-10-02 DIAGNOSIS — N2581 Secondary hyperparathyroidism of renal origin: Secondary | ICD-10-CM | POA: Diagnosis not present

## 2017-10-02 DIAGNOSIS — N186 End stage renal disease: Secondary | ICD-10-CM | POA: Diagnosis not present

## 2017-10-02 DIAGNOSIS — E1129 Type 2 diabetes mellitus with other diabetic kidney complication: Secondary | ICD-10-CM | POA: Diagnosis not present

## 2017-10-04 DIAGNOSIS — D631 Anemia in chronic kidney disease: Secondary | ICD-10-CM | POA: Diagnosis not present

## 2017-10-04 DIAGNOSIS — N186 End stage renal disease: Secondary | ICD-10-CM | POA: Diagnosis not present

## 2017-10-04 DIAGNOSIS — E1129 Type 2 diabetes mellitus with other diabetic kidney complication: Secondary | ICD-10-CM | POA: Diagnosis not present

## 2017-10-04 DIAGNOSIS — N2581 Secondary hyperparathyroidism of renal origin: Secondary | ICD-10-CM | POA: Diagnosis not present

## 2017-10-07 DIAGNOSIS — N186 End stage renal disease: Secondary | ICD-10-CM | POA: Diagnosis not present

## 2017-10-07 DIAGNOSIS — N2581 Secondary hyperparathyroidism of renal origin: Secondary | ICD-10-CM | POA: Diagnosis not present

## 2017-10-07 DIAGNOSIS — E1129 Type 2 diabetes mellitus with other diabetic kidney complication: Secondary | ICD-10-CM | POA: Diagnosis not present

## 2017-10-07 DIAGNOSIS — D631 Anemia in chronic kidney disease: Secondary | ICD-10-CM | POA: Diagnosis not present

## 2017-10-09 DIAGNOSIS — E1129 Type 2 diabetes mellitus with other diabetic kidney complication: Secondary | ICD-10-CM | POA: Diagnosis not present

## 2017-10-09 DIAGNOSIS — D631 Anemia in chronic kidney disease: Secondary | ICD-10-CM | POA: Diagnosis not present

## 2017-10-09 DIAGNOSIS — N186 End stage renal disease: Secondary | ICD-10-CM | POA: Diagnosis not present

## 2017-10-09 DIAGNOSIS — N2581 Secondary hyperparathyroidism of renal origin: Secondary | ICD-10-CM | POA: Diagnosis not present

## 2017-10-11 DIAGNOSIS — E1129 Type 2 diabetes mellitus with other diabetic kidney complication: Secondary | ICD-10-CM | POA: Diagnosis not present

## 2017-10-11 DIAGNOSIS — N186 End stage renal disease: Secondary | ICD-10-CM | POA: Diagnosis not present

## 2017-10-11 DIAGNOSIS — D631 Anemia in chronic kidney disease: Secondary | ICD-10-CM | POA: Diagnosis not present

## 2017-10-11 DIAGNOSIS — N2581 Secondary hyperparathyroidism of renal origin: Secondary | ICD-10-CM | POA: Diagnosis not present

## 2017-10-14 DIAGNOSIS — D631 Anemia in chronic kidney disease: Secondary | ICD-10-CM | POA: Diagnosis not present

## 2017-10-14 DIAGNOSIS — N186 End stage renal disease: Secondary | ICD-10-CM | POA: Diagnosis not present

## 2017-10-14 DIAGNOSIS — N2581 Secondary hyperparathyroidism of renal origin: Secondary | ICD-10-CM | POA: Diagnosis not present

## 2017-10-14 DIAGNOSIS — E1129 Type 2 diabetes mellitus with other diabetic kidney complication: Secondary | ICD-10-CM | POA: Diagnosis not present

## 2017-10-16 DIAGNOSIS — E1129 Type 2 diabetes mellitus with other diabetic kidney complication: Secondary | ICD-10-CM | POA: Diagnosis not present

## 2017-10-16 DIAGNOSIS — N186 End stage renal disease: Secondary | ICD-10-CM | POA: Diagnosis not present

## 2017-10-16 DIAGNOSIS — D631 Anemia in chronic kidney disease: Secondary | ICD-10-CM | POA: Diagnosis not present

## 2017-10-16 DIAGNOSIS — N2581 Secondary hyperparathyroidism of renal origin: Secondary | ICD-10-CM | POA: Diagnosis not present

## 2017-10-18 DIAGNOSIS — E1129 Type 2 diabetes mellitus with other diabetic kidney complication: Secondary | ICD-10-CM | POA: Diagnosis not present

## 2017-10-18 DIAGNOSIS — N2581 Secondary hyperparathyroidism of renal origin: Secondary | ICD-10-CM | POA: Diagnosis not present

## 2017-10-18 DIAGNOSIS — N186 End stage renal disease: Secondary | ICD-10-CM | POA: Diagnosis not present

## 2017-10-18 DIAGNOSIS — D631 Anemia in chronic kidney disease: Secondary | ICD-10-CM | POA: Diagnosis not present

## 2017-10-21 DIAGNOSIS — N2581 Secondary hyperparathyroidism of renal origin: Secondary | ICD-10-CM | POA: Diagnosis not present

## 2017-10-21 DIAGNOSIS — E1129 Type 2 diabetes mellitus with other diabetic kidney complication: Secondary | ICD-10-CM | POA: Diagnosis not present

## 2017-10-21 DIAGNOSIS — N186 End stage renal disease: Secondary | ICD-10-CM | POA: Diagnosis not present

## 2017-10-21 DIAGNOSIS — D631 Anemia in chronic kidney disease: Secondary | ICD-10-CM | POA: Diagnosis not present

## 2017-10-22 DIAGNOSIS — H40013 Open angle with borderline findings, low risk, bilateral: Secondary | ICD-10-CM | POA: Diagnosis not present

## 2017-10-22 DIAGNOSIS — H52203 Unspecified astigmatism, bilateral: Secondary | ICD-10-CM | POA: Diagnosis not present

## 2017-10-22 DIAGNOSIS — H26492 Other secondary cataract, left eye: Secondary | ICD-10-CM | POA: Diagnosis not present

## 2017-10-22 DIAGNOSIS — E113293 Type 2 diabetes mellitus with mild nonproliferative diabetic retinopathy without macular edema, bilateral: Secondary | ICD-10-CM | POA: Diagnosis not present

## 2017-10-23 DIAGNOSIS — N186 End stage renal disease: Secondary | ICD-10-CM | POA: Diagnosis not present

## 2017-10-23 DIAGNOSIS — N2581 Secondary hyperparathyroidism of renal origin: Secondary | ICD-10-CM | POA: Diagnosis not present

## 2017-10-23 DIAGNOSIS — E1129 Type 2 diabetes mellitus with other diabetic kidney complication: Secondary | ICD-10-CM | POA: Diagnosis not present

## 2017-10-23 DIAGNOSIS — D631 Anemia in chronic kidney disease: Secondary | ICD-10-CM | POA: Diagnosis not present

## 2017-10-25 DIAGNOSIS — N186 End stage renal disease: Secondary | ICD-10-CM | POA: Diagnosis not present

## 2017-10-25 DIAGNOSIS — E1129 Type 2 diabetes mellitus with other diabetic kidney complication: Secondary | ICD-10-CM | POA: Diagnosis not present

## 2017-10-25 DIAGNOSIS — D631 Anemia in chronic kidney disease: Secondary | ICD-10-CM | POA: Diagnosis not present

## 2017-10-25 DIAGNOSIS — N2581 Secondary hyperparathyroidism of renal origin: Secondary | ICD-10-CM | POA: Diagnosis not present

## 2017-10-28 DIAGNOSIS — D631 Anemia in chronic kidney disease: Secondary | ICD-10-CM | POA: Diagnosis not present

## 2017-10-28 DIAGNOSIS — E1129 Type 2 diabetes mellitus with other diabetic kidney complication: Secondary | ICD-10-CM | POA: Diagnosis not present

## 2017-10-28 DIAGNOSIS — N2581 Secondary hyperparathyroidism of renal origin: Secondary | ICD-10-CM | POA: Diagnosis not present

## 2017-10-28 DIAGNOSIS — N186 End stage renal disease: Secondary | ICD-10-CM | POA: Diagnosis not present

## 2017-10-30 DIAGNOSIS — N2581 Secondary hyperparathyroidism of renal origin: Secondary | ICD-10-CM | POA: Diagnosis not present

## 2017-10-30 DIAGNOSIS — E1129 Type 2 diabetes mellitus with other diabetic kidney complication: Secondary | ICD-10-CM | POA: Diagnosis not present

## 2017-10-30 DIAGNOSIS — Z992 Dependence on renal dialysis: Secondary | ICD-10-CM | POA: Diagnosis not present

## 2017-10-30 DIAGNOSIS — I120 Hypertensive chronic kidney disease with stage 5 chronic kidney disease or end stage renal disease: Secondary | ICD-10-CM | POA: Diagnosis not present

## 2017-10-30 DIAGNOSIS — D631 Anemia in chronic kidney disease: Secondary | ICD-10-CM | POA: Diagnosis not present

## 2017-10-30 DIAGNOSIS — N186 End stage renal disease: Secondary | ICD-10-CM | POA: Diagnosis not present

## 2017-11-01 DIAGNOSIS — N186 End stage renal disease: Secondary | ICD-10-CM | POA: Diagnosis not present

## 2017-11-01 DIAGNOSIS — D509 Iron deficiency anemia, unspecified: Secondary | ICD-10-CM | POA: Diagnosis not present

## 2017-11-01 DIAGNOSIS — E1129 Type 2 diabetes mellitus with other diabetic kidney complication: Secondary | ICD-10-CM | POA: Diagnosis not present

## 2017-11-01 DIAGNOSIS — N2581 Secondary hyperparathyroidism of renal origin: Secondary | ICD-10-CM | POA: Diagnosis not present

## 2017-11-01 DIAGNOSIS — D631 Anemia in chronic kidney disease: Secondary | ICD-10-CM | POA: Diagnosis not present

## 2017-11-04 DIAGNOSIS — D509 Iron deficiency anemia, unspecified: Secondary | ICD-10-CM | POA: Diagnosis not present

## 2017-11-04 DIAGNOSIS — N186 End stage renal disease: Secondary | ICD-10-CM | POA: Diagnosis not present

## 2017-11-04 DIAGNOSIS — D631 Anemia in chronic kidney disease: Secondary | ICD-10-CM | POA: Diagnosis not present

## 2017-11-04 DIAGNOSIS — N2581 Secondary hyperparathyroidism of renal origin: Secondary | ICD-10-CM | POA: Diagnosis not present

## 2017-11-04 DIAGNOSIS — E1129 Type 2 diabetes mellitus with other diabetic kidney complication: Secondary | ICD-10-CM | POA: Diagnosis not present

## 2017-11-06 DIAGNOSIS — D509 Iron deficiency anemia, unspecified: Secondary | ICD-10-CM | POA: Diagnosis not present

## 2017-11-06 DIAGNOSIS — E1129 Type 2 diabetes mellitus with other diabetic kidney complication: Secondary | ICD-10-CM | POA: Diagnosis not present

## 2017-11-06 DIAGNOSIS — N186 End stage renal disease: Secondary | ICD-10-CM | POA: Diagnosis not present

## 2017-11-06 DIAGNOSIS — N2581 Secondary hyperparathyroidism of renal origin: Secondary | ICD-10-CM | POA: Diagnosis not present

## 2017-11-06 DIAGNOSIS — D631 Anemia in chronic kidney disease: Secondary | ICD-10-CM | POA: Diagnosis not present

## 2017-11-08 DIAGNOSIS — D631 Anemia in chronic kidney disease: Secondary | ICD-10-CM | POA: Diagnosis not present

## 2017-11-08 DIAGNOSIS — N2581 Secondary hyperparathyroidism of renal origin: Secondary | ICD-10-CM | POA: Diagnosis not present

## 2017-11-08 DIAGNOSIS — E1129 Type 2 diabetes mellitus with other diabetic kidney complication: Secondary | ICD-10-CM | POA: Diagnosis not present

## 2017-11-08 DIAGNOSIS — N186 End stage renal disease: Secondary | ICD-10-CM | POA: Diagnosis not present

## 2017-11-08 DIAGNOSIS — D509 Iron deficiency anemia, unspecified: Secondary | ICD-10-CM | POA: Diagnosis not present

## 2017-11-13 DIAGNOSIS — N186 End stage renal disease: Secondary | ICD-10-CM | POA: Diagnosis not present

## 2017-11-13 DIAGNOSIS — D509 Iron deficiency anemia, unspecified: Secondary | ICD-10-CM | POA: Diagnosis not present

## 2017-11-13 DIAGNOSIS — D631 Anemia in chronic kidney disease: Secondary | ICD-10-CM | POA: Diagnosis not present

## 2017-11-13 DIAGNOSIS — E1129 Type 2 diabetes mellitus with other diabetic kidney complication: Secondary | ICD-10-CM | POA: Diagnosis not present

## 2017-11-13 DIAGNOSIS — N2581 Secondary hyperparathyroidism of renal origin: Secondary | ICD-10-CM | POA: Diagnosis not present

## 2017-11-15 DIAGNOSIS — E1129 Type 2 diabetes mellitus with other diabetic kidney complication: Secondary | ICD-10-CM | POA: Diagnosis not present

## 2017-11-15 DIAGNOSIS — D631 Anemia in chronic kidney disease: Secondary | ICD-10-CM | POA: Diagnosis not present

## 2017-11-15 DIAGNOSIS — D509 Iron deficiency anemia, unspecified: Secondary | ICD-10-CM | POA: Diagnosis not present

## 2017-11-15 DIAGNOSIS — N2581 Secondary hyperparathyroidism of renal origin: Secondary | ICD-10-CM | POA: Diagnosis not present

## 2017-11-15 DIAGNOSIS — N186 End stage renal disease: Secondary | ICD-10-CM | POA: Diagnosis not present

## 2017-11-19 DIAGNOSIS — E1129 Type 2 diabetes mellitus with other diabetic kidney complication: Secondary | ICD-10-CM | POA: Diagnosis not present

## 2017-11-19 DIAGNOSIS — D631 Anemia in chronic kidney disease: Secondary | ICD-10-CM | POA: Diagnosis not present

## 2017-11-19 DIAGNOSIS — D509 Iron deficiency anemia, unspecified: Secondary | ICD-10-CM | POA: Diagnosis not present

## 2017-11-19 DIAGNOSIS — N186 End stage renal disease: Secondary | ICD-10-CM | POA: Diagnosis not present

## 2017-11-19 DIAGNOSIS — N2581 Secondary hyperparathyroidism of renal origin: Secondary | ICD-10-CM | POA: Diagnosis not present

## 2017-11-22 DIAGNOSIS — N186 End stage renal disease: Secondary | ICD-10-CM | POA: Diagnosis not present

## 2017-11-22 DIAGNOSIS — E1129 Type 2 diabetes mellitus with other diabetic kidney complication: Secondary | ICD-10-CM | POA: Diagnosis not present

## 2017-11-22 DIAGNOSIS — D509 Iron deficiency anemia, unspecified: Secondary | ICD-10-CM | POA: Diagnosis not present

## 2017-11-22 DIAGNOSIS — D631 Anemia in chronic kidney disease: Secondary | ICD-10-CM | POA: Diagnosis not present

## 2017-11-22 DIAGNOSIS — N2581 Secondary hyperparathyroidism of renal origin: Secondary | ICD-10-CM | POA: Diagnosis not present

## 2017-11-25 DIAGNOSIS — D631 Anemia in chronic kidney disease: Secondary | ICD-10-CM | POA: Diagnosis not present

## 2017-11-25 DIAGNOSIS — D509 Iron deficiency anemia, unspecified: Secondary | ICD-10-CM | POA: Diagnosis not present

## 2017-11-25 DIAGNOSIS — N2581 Secondary hyperparathyroidism of renal origin: Secondary | ICD-10-CM | POA: Diagnosis not present

## 2017-11-25 DIAGNOSIS — N186 End stage renal disease: Secondary | ICD-10-CM | POA: Diagnosis not present

## 2017-11-25 DIAGNOSIS — E1129 Type 2 diabetes mellitus with other diabetic kidney complication: Secondary | ICD-10-CM | POA: Diagnosis not present

## 2017-11-27 DIAGNOSIS — N2581 Secondary hyperparathyroidism of renal origin: Secondary | ICD-10-CM | POA: Diagnosis not present

## 2017-11-27 DIAGNOSIS — N186 End stage renal disease: Secondary | ICD-10-CM | POA: Diagnosis not present

## 2017-11-27 DIAGNOSIS — E1129 Type 2 diabetes mellitus with other diabetic kidney complication: Secondary | ICD-10-CM | POA: Diagnosis not present

## 2017-11-27 DIAGNOSIS — D509 Iron deficiency anemia, unspecified: Secondary | ICD-10-CM | POA: Diagnosis not present

## 2017-11-27 DIAGNOSIS — D631 Anemia in chronic kidney disease: Secondary | ICD-10-CM | POA: Diagnosis not present

## 2017-11-29 DIAGNOSIS — Z992 Dependence on renal dialysis: Secondary | ICD-10-CM | POA: Diagnosis not present

## 2017-11-29 DIAGNOSIS — D631 Anemia in chronic kidney disease: Secondary | ICD-10-CM | POA: Diagnosis not present

## 2017-11-29 DIAGNOSIS — N2581 Secondary hyperparathyroidism of renal origin: Secondary | ICD-10-CM | POA: Diagnosis not present

## 2017-11-29 DIAGNOSIS — E1129 Type 2 diabetes mellitus with other diabetic kidney complication: Secondary | ICD-10-CM | POA: Diagnosis not present

## 2017-11-29 DIAGNOSIS — N186 End stage renal disease: Secondary | ICD-10-CM | POA: Diagnosis not present

## 2017-11-29 DIAGNOSIS — D509 Iron deficiency anemia, unspecified: Secondary | ICD-10-CM | POA: Diagnosis not present

## 2017-11-29 DIAGNOSIS — I120 Hypertensive chronic kidney disease with stage 5 chronic kidney disease or end stage renal disease: Secondary | ICD-10-CM | POA: Diagnosis not present

## 2017-12-02 DIAGNOSIS — Z992 Dependence on renal dialysis: Secondary | ICD-10-CM | POA: Diagnosis not present

## 2017-12-02 DIAGNOSIS — N2581 Secondary hyperparathyroidism of renal origin: Secondary | ICD-10-CM | POA: Diagnosis not present

## 2017-12-02 DIAGNOSIS — D509 Iron deficiency anemia, unspecified: Secondary | ICD-10-CM | POA: Diagnosis not present

## 2017-12-02 DIAGNOSIS — E1129 Type 2 diabetes mellitus with other diabetic kidney complication: Secondary | ICD-10-CM | POA: Diagnosis not present

## 2017-12-02 DIAGNOSIS — D631 Anemia in chronic kidney disease: Secondary | ICD-10-CM | POA: Diagnosis not present

## 2017-12-02 DIAGNOSIS — N186 End stage renal disease: Secondary | ICD-10-CM | POA: Diagnosis not present

## 2017-12-04 DIAGNOSIS — N2581 Secondary hyperparathyroidism of renal origin: Secondary | ICD-10-CM | POA: Diagnosis not present

## 2017-12-04 DIAGNOSIS — D631 Anemia in chronic kidney disease: Secondary | ICD-10-CM | POA: Diagnosis not present

## 2017-12-04 DIAGNOSIS — N186 End stage renal disease: Secondary | ICD-10-CM | POA: Diagnosis not present

## 2017-12-04 DIAGNOSIS — E1129 Type 2 diabetes mellitus with other diabetic kidney complication: Secondary | ICD-10-CM | POA: Diagnosis not present

## 2017-12-04 DIAGNOSIS — Z992 Dependence on renal dialysis: Secondary | ICD-10-CM | POA: Diagnosis not present

## 2017-12-04 DIAGNOSIS — D509 Iron deficiency anemia, unspecified: Secondary | ICD-10-CM | POA: Diagnosis not present

## 2017-12-06 DIAGNOSIS — Z992 Dependence on renal dialysis: Secondary | ICD-10-CM | POA: Diagnosis not present

## 2017-12-06 DIAGNOSIS — D631 Anemia in chronic kidney disease: Secondary | ICD-10-CM | POA: Diagnosis not present

## 2017-12-06 DIAGNOSIS — D509 Iron deficiency anemia, unspecified: Secondary | ICD-10-CM | POA: Diagnosis not present

## 2017-12-06 DIAGNOSIS — E1129 Type 2 diabetes mellitus with other diabetic kidney complication: Secondary | ICD-10-CM | POA: Diagnosis not present

## 2017-12-06 DIAGNOSIS — N2581 Secondary hyperparathyroidism of renal origin: Secondary | ICD-10-CM | POA: Diagnosis not present

## 2017-12-06 DIAGNOSIS — N186 End stage renal disease: Secondary | ICD-10-CM | POA: Diagnosis not present

## 2017-12-11 DIAGNOSIS — E1129 Type 2 diabetes mellitus with other diabetic kidney complication: Secondary | ICD-10-CM | POA: Diagnosis not present

## 2017-12-11 DIAGNOSIS — D631 Anemia in chronic kidney disease: Secondary | ICD-10-CM | POA: Diagnosis not present

## 2017-12-11 DIAGNOSIS — N2581 Secondary hyperparathyroidism of renal origin: Secondary | ICD-10-CM | POA: Diagnosis not present

## 2017-12-11 DIAGNOSIS — Z992 Dependence on renal dialysis: Secondary | ICD-10-CM | POA: Diagnosis not present

## 2017-12-11 DIAGNOSIS — D509 Iron deficiency anemia, unspecified: Secondary | ICD-10-CM | POA: Diagnosis not present

## 2017-12-11 DIAGNOSIS — N186 End stage renal disease: Secondary | ICD-10-CM | POA: Diagnosis not present

## 2017-12-13 DIAGNOSIS — Z992 Dependence on renal dialysis: Secondary | ICD-10-CM | POA: Diagnosis not present

## 2017-12-13 DIAGNOSIS — D631 Anemia in chronic kidney disease: Secondary | ICD-10-CM | POA: Diagnosis not present

## 2017-12-13 DIAGNOSIS — N186 End stage renal disease: Secondary | ICD-10-CM | POA: Diagnosis not present

## 2017-12-13 DIAGNOSIS — N2581 Secondary hyperparathyroidism of renal origin: Secondary | ICD-10-CM | POA: Diagnosis not present

## 2017-12-13 DIAGNOSIS — D509 Iron deficiency anemia, unspecified: Secondary | ICD-10-CM | POA: Diagnosis not present

## 2017-12-13 DIAGNOSIS — E1129 Type 2 diabetes mellitus with other diabetic kidney complication: Secondary | ICD-10-CM | POA: Diagnosis not present

## 2017-12-16 DIAGNOSIS — N186 End stage renal disease: Secondary | ICD-10-CM | POA: Diagnosis not present

## 2017-12-16 DIAGNOSIS — D509 Iron deficiency anemia, unspecified: Secondary | ICD-10-CM | POA: Diagnosis not present

## 2017-12-16 DIAGNOSIS — Z992 Dependence on renal dialysis: Secondary | ICD-10-CM | POA: Diagnosis not present

## 2017-12-16 DIAGNOSIS — D631 Anemia in chronic kidney disease: Secondary | ICD-10-CM | POA: Diagnosis not present

## 2017-12-16 DIAGNOSIS — E1129 Type 2 diabetes mellitus with other diabetic kidney complication: Secondary | ICD-10-CM | POA: Diagnosis not present

## 2017-12-16 DIAGNOSIS — N2581 Secondary hyperparathyroidism of renal origin: Secondary | ICD-10-CM | POA: Diagnosis not present

## 2017-12-18 DIAGNOSIS — D509 Iron deficiency anemia, unspecified: Secondary | ICD-10-CM | POA: Diagnosis not present

## 2017-12-18 DIAGNOSIS — Z992 Dependence on renal dialysis: Secondary | ICD-10-CM | POA: Diagnosis not present

## 2017-12-18 DIAGNOSIS — D631 Anemia in chronic kidney disease: Secondary | ICD-10-CM | POA: Diagnosis not present

## 2017-12-18 DIAGNOSIS — N186 End stage renal disease: Secondary | ICD-10-CM | POA: Diagnosis not present

## 2017-12-18 DIAGNOSIS — E1129 Type 2 diabetes mellitus with other diabetic kidney complication: Secondary | ICD-10-CM | POA: Diagnosis not present

## 2017-12-18 DIAGNOSIS — N2581 Secondary hyperparathyroidism of renal origin: Secondary | ICD-10-CM | POA: Diagnosis not present

## 2017-12-20 DIAGNOSIS — D631 Anemia in chronic kidney disease: Secondary | ICD-10-CM | POA: Diagnosis not present

## 2017-12-20 DIAGNOSIS — N186 End stage renal disease: Secondary | ICD-10-CM | POA: Diagnosis not present

## 2017-12-20 DIAGNOSIS — D509 Iron deficiency anemia, unspecified: Secondary | ICD-10-CM | POA: Diagnosis not present

## 2017-12-20 DIAGNOSIS — E1129 Type 2 diabetes mellitus with other diabetic kidney complication: Secondary | ICD-10-CM | POA: Diagnosis not present

## 2017-12-20 DIAGNOSIS — Z992 Dependence on renal dialysis: Secondary | ICD-10-CM | POA: Diagnosis not present

## 2017-12-20 DIAGNOSIS — N2581 Secondary hyperparathyroidism of renal origin: Secondary | ICD-10-CM | POA: Diagnosis not present

## 2017-12-22 DIAGNOSIS — D509 Iron deficiency anemia, unspecified: Secondary | ICD-10-CM | POA: Diagnosis not present

## 2017-12-22 DIAGNOSIS — D631 Anemia in chronic kidney disease: Secondary | ICD-10-CM | POA: Diagnosis not present

## 2017-12-22 DIAGNOSIS — Z992 Dependence on renal dialysis: Secondary | ICD-10-CM | POA: Diagnosis not present

## 2017-12-22 DIAGNOSIS — N186 End stage renal disease: Secondary | ICD-10-CM | POA: Diagnosis not present

## 2017-12-22 DIAGNOSIS — E1129 Type 2 diabetes mellitus with other diabetic kidney complication: Secondary | ICD-10-CM | POA: Diagnosis not present

## 2017-12-22 DIAGNOSIS — N2581 Secondary hyperparathyroidism of renal origin: Secondary | ICD-10-CM | POA: Diagnosis not present

## 2017-12-25 DIAGNOSIS — N2581 Secondary hyperparathyroidism of renal origin: Secondary | ICD-10-CM | POA: Diagnosis not present

## 2017-12-25 DIAGNOSIS — N186 End stage renal disease: Secondary | ICD-10-CM | POA: Diagnosis not present

## 2017-12-25 DIAGNOSIS — D631 Anemia in chronic kidney disease: Secondary | ICD-10-CM | POA: Diagnosis not present

## 2017-12-25 DIAGNOSIS — Z992 Dependence on renal dialysis: Secondary | ICD-10-CM | POA: Diagnosis not present

## 2017-12-25 DIAGNOSIS — D509 Iron deficiency anemia, unspecified: Secondary | ICD-10-CM | POA: Diagnosis not present

## 2017-12-25 DIAGNOSIS — E1129 Type 2 diabetes mellitus with other diabetic kidney complication: Secondary | ICD-10-CM | POA: Diagnosis not present

## 2017-12-27 DIAGNOSIS — N2581 Secondary hyperparathyroidism of renal origin: Secondary | ICD-10-CM | POA: Diagnosis not present

## 2017-12-27 DIAGNOSIS — D509 Iron deficiency anemia, unspecified: Secondary | ICD-10-CM | POA: Diagnosis not present

## 2017-12-27 DIAGNOSIS — E1129 Type 2 diabetes mellitus with other diabetic kidney complication: Secondary | ICD-10-CM | POA: Diagnosis not present

## 2017-12-27 DIAGNOSIS — Z992 Dependence on renal dialysis: Secondary | ICD-10-CM | POA: Diagnosis not present

## 2017-12-27 DIAGNOSIS — D631 Anemia in chronic kidney disease: Secondary | ICD-10-CM | POA: Diagnosis not present

## 2017-12-27 DIAGNOSIS — N186 End stage renal disease: Secondary | ICD-10-CM | POA: Diagnosis not present

## 2017-12-29 DIAGNOSIS — D631 Anemia in chronic kidney disease: Secondary | ICD-10-CM | POA: Diagnosis not present

## 2017-12-29 DIAGNOSIS — E1129 Type 2 diabetes mellitus with other diabetic kidney complication: Secondary | ICD-10-CM | POA: Diagnosis not present

## 2017-12-29 DIAGNOSIS — N186 End stage renal disease: Secondary | ICD-10-CM | POA: Diagnosis not present

## 2017-12-29 DIAGNOSIS — N2581 Secondary hyperparathyroidism of renal origin: Secondary | ICD-10-CM | POA: Diagnosis not present

## 2017-12-29 DIAGNOSIS — Z992 Dependence on renal dialysis: Secondary | ICD-10-CM | POA: Diagnosis not present

## 2017-12-29 DIAGNOSIS — D509 Iron deficiency anemia, unspecified: Secondary | ICD-10-CM | POA: Diagnosis not present

## 2017-12-30 DIAGNOSIS — N186 End stage renal disease: Secondary | ICD-10-CM | POA: Diagnosis not present

## 2017-12-30 DIAGNOSIS — I120 Hypertensive chronic kidney disease with stage 5 chronic kidney disease or end stage renal disease: Secondary | ICD-10-CM | POA: Diagnosis not present

## 2017-12-30 DIAGNOSIS — Z992 Dependence on renal dialysis: Secondary | ICD-10-CM | POA: Diagnosis not present

## 2018-01-01 DIAGNOSIS — N186 End stage renal disease: Secondary | ICD-10-CM | POA: Diagnosis not present

## 2018-01-01 DIAGNOSIS — N2581 Secondary hyperparathyroidism of renal origin: Secondary | ICD-10-CM | POA: Diagnosis not present

## 2018-01-01 DIAGNOSIS — D509 Iron deficiency anemia, unspecified: Secondary | ICD-10-CM | POA: Diagnosis not present

## 2018-01-01 DIAGNOSIS — D631 Anemia in chronic kidney disease: Secondary | ICD-10-CM | POA: Diagnosis not present

## 2018-01-01 DIAGNOSIS — Z992 Dependence on renal dialysis: Secondary | ICD-10-CM | POA: Diagnosis not present

## 2018-01-01 DIAGNOSIS — E1129 Type 2 diabetes mellitus with other diabetic kidney complication: Secondary | ICD-10-CM | POA: Diagnosis not present

## 2018-01-03 DIAGNOSIS — D509 Iron deficiency anemia, unspecified: Secondary | ICD-10-CM | POA: Diagnosis not present

## 2018-01-03 DIAGNOSIS — E1129 Type 2 diabetes mellitus with other diabetic kidney complication: Secondary | ICD-10-CM | POA: Diagnosis not present

## 2018-01-03 DIAGNOSIS — N2581 Secondary hyperparathyroidism of renal origin: Secondary | ICD-10-CM | POA: Diagnosis not present

## 2018-01-03 DIAGNOSIS — D631 Anemia in chronic kidney disease: Secondary | ICD-10-CM | POA: Diagnosis not present

## 2018-01-03 DIAGNOSIS — Z992 Dependence on renal dialysis: Secondary | ICD-10-CM | POA: Diagnosis not present

## 2018-01-03 DIAGNOSIS — N186 End stage renal disease: Secondary | ICD-10-CM | POA: Diagnosis not present

## 2018-01-06 DIAGNOSIS — E1129 Type 2 diabetes mellitus with other diabetic kidney complication: Secondary | ICD-10-CM | POA: Diagnosis not present

## 2018-01-06 DIAGNOSIS — Z992 Dependence on renal dialysis: Secondary | ICD-10-CM | POA: Diagnosis not present

## 2018-01-06 DIAGNOSIS — D631 Anemia in chronic kidney disease: Secondary | ICD-10-CM | POA: Diagnosis not present

## 2018-01-06 DIAGNOSIS — N186 End stage renal disease: Secondary | ICD-10-CM | POA: Diagnosis not present

## 2018-01-06 DIAGNOSIS — N2581 Secondary hyperparathyroidism of renal origin: Secondary | ICD-10-CM | POA: Diagnosis not present

## 2018-01-06 DIAGNOSIS — D509 Iron deficiency anemia, unspecified: Secondary | ICD-10-CM | POA: Diagnosis not present

## 2018-01-08 DIAGNOSIS — N2581 Secondary hyperparathyroidism of renal origin: Secondary | ICD-10-CM | POA: Diagnosis not present

## 2018-01-08 DIAGNOSIS — E1129 Type 2 diabetes mellitus with other diabetic kidney complication: Secondary | ICD-10-CM | POA: Diagnosis not present

## 2018-01-08 DIAGNOSIS — N186 End stage renal disease: Secondary | ICD-10-CM | POA: Diagnosis not present

## 2018-01-08 DIAGNOSIS — D631 Anemia in chronic kidney disease: Secondary | ICD-10-CM | POA: Diagnosis not present

## 2018-01-08 DIAGNOSIS — D509 Iron deficiency anemia, unspecified: Secondary | ICD-10-CM | POA: Diagnosis not present

## 2018-01-08 DIAGNOSIS — Z992 Dependence on renal dialysis: Secondary | ICD-10-CM | POA: Diagnosis not present

## 2018-01-10 DIAGNOSIS — D631 Anemia in chronic kidney disease: Secondary | ICD-10-CM | POA: Diagnosis not present

## 2018-01-10 DIAGNOSIS — D509 Iron deficiency anemia, unspecified: Secondary | ICD-10-CM | POA: Diagnosis not present

## 2018-01-10 DIAGNOSIS — N2581 Secondary hyperparathyroidism of renal origin: Secondary | ICD-10-CM | POA: Diagnosis not present

## 2018-01-10 DIAGNOSIS — Z992 Dependence on renal dialysis: Secondary | ICD-10-CM | POA: Diagnosis not present

## 2018-01-10 DIAGNOSIS — N186 End stage renal disease: Secondary | ICD-10-CM | POA: Diagnosis not present

## 2018-01-10 DIAGNOSIS — E1129 Type 2 diabetes mellitus with other diabetic kidney complication: Secondary | ICD-10-CM | POA: Diagnosis not present

## 2018-01-13 DIAGNOSIS — N2581 Secondary hyperparathyroidism of renal origin: Secondary | ICD-10-CM | POA: Diagnosis not present

## 2018-01-13 DIAGNOSIS — Z992 Dependence on renal dialysis: Secondary | ICD-10-CM | POA: Diagnosis not present

## 2018-01-13 DIAGNOSIS — D509 Iron deficiency anemia, unspecified: Secondary | ICD-10-CM | POA: Diagnosis not present

## 2018-01-13 DIAGNOSIS — N186 End stage renal disease: Secondary | ICD-10-CM | POA: Diagnosis not present

## 2018-01-13 DIAGNOSIS — D631 Anemia in chronic kidney disease: Secondary | ICD-10-CM | POA: Diagnosis not present

## 2018-01-13 DIAGNOSIS — E1129 Type 2 diabetes mellitus with other diabetic kidney complication: Secondary | ICD-10-CM | POA: Diagnosis not present

## 2018-01-15 DIAGNOSIS — D509 Iron deficiency anemia, unspecified: Secondary | ICD-10-CM | POA: Diagnosis not present

## 2018-01-15 DIAGNOSIS — N2581 Secondary hyperparathyroidism of renal origin: Secondary | ICD-10-CM | POA: Diagnosis not present

## 2018-01-15 DIAGNOSIS — E1129 Type 2 diabetes mellitus with other diabetic kidney complication: Secondary | ICD-10-CM | POA: Diagnosis not present

## 2018-01-15 DIAGNOSIS — D631 Anemia in chronic kidney disease: Secondary | ICD-10-CM | POA: Diagnosis not present

## 2018-01-15 DIAGNOSIS — N186 End stage renal disease: Secondary | ICD-10-CM | POA: Diagnosis not present

## 2018-01-15 DIAGNOSIS — Z992 Dependence on renal dialysis: Secondary | ICD-10-CM | POA: Diagnosis not present

## 2018-01-17 DIAGNOSIS — D631 Anemia in chronic kidney disease: Secondary | ICD-10-CM | POA: Diagnosis not present

## 2018-01-17 DIAGNOSIS — Z992 Dependence on renal dialysis: Secondary | ICD-10-CM | POA: Diagnosis not present

## 2018-01-17 DIAGNOSIS — N186 End stage renal disease: Secondary | ICD-10-CM | POA: Diagnosis not present

## 2018-01-17 DIAGNOSIS — E1129 Type 2 diabetes mellitus with other diabetic kidney complication: Secondary | ICD-10-CM | POA: Diagnosis not present

## 2018-01-17 DIAGNOSIS — D509 Iron deficiency anemia, unspecified: Secondary | ICD-10-CM | POA: Diagnosis not present

## 2018-01-17 DIAGNOSIS — N2581 Secondary hyperparathyroidism of renal origin: Secondary | ICD-10-CM | POA: Diagnosis not present

## 2018-01-20 DIAGNOSIS — N2581 Secondary hyperparathyroidism of renal origin: Secondary | ICD-10-CM | POA: Diagnosis not present

## 2018-01-20 DIAGNOSIS — Z992 Dependence on renal dialysis: Secondary | ICD-10-CM | POA: Diagnosis not present

## 2018-01-20 DIAGNOSIS — E1129 Type 2 diabetes mellitus with other diabetic kidney complication: Secondary | ICD-10-CM | POA: Diagnosis not present

## 2018-01-20 DIAGNOSIS — N186 End stage renal disease: Secondary | ICD-10-CM | POA: Diagnosis not present

## 2018-01-20 DIAGNOSIS — D509 Iron deficiency anemia, unspecified: Secondary | ICD-10-CM | POA: Diagnosis not present

## 2018-01-20 DIAGNOSIS — D631 Anemia in chronic kidney disease: Secondary | ICD-10-CM | POA: Diagnosis not present

## 2018-01-22 DIAGNOSIS — E1129 Type 2 diabetes mellitus with other diabetic kidney complication: Secondary | ICD-10-CM | POA: Diagnosis not present

## 2018-01-22 DIAGNOSIS — D509 Iron deficiency anemia, unspecified: Secondary | ICD-10-CM | POA: Diagnosis not present

## 2018-01-22 DIAGNOSIS — Z992 Dependence on renal dialysis: Secondary | ICD-10-CM | POA: Diagnosis not present

## 2018-01-22 DIAGNOSIS — N2581 Secondary hyperparathyroidism of renal origin: Secondary | ICD-10-CM | POA: Diagnosis not present

## 2018-01-22 DIAGNOSIS — D631 Anemia in chronic kidney disease: Secondary | ICD-10-CM | POA: Diagnosis not present

## 2018-01-22 DIAGNOSIS — N186 End stage renal disease: Secondary | ICD-10-CM | POA: Diagnosis not present

## 2018-01-24 DIAGNOSIS — Z992 Dependence on renal dialysis: Secondary | ICD-10-CM | POA: Diagnosis not present

## 2018-01-24 DIAGNOSIS — D631 Anemia in chronic kidney disease: Secondary | ICD-10-CM | POA: Diagnosis not present

## 2018-01-24 DIAGNOSIS — N2581 Secondary hyperparathyroidism of renal origin: Secondary | ICD-10-CM | POA: Diagnosis not present

## 2018-01-24 DIAGNOSIS — D509 Iron deficiency anemia, unspecified: Secondary | ICD-10-CM | POA: Diagnosis not present

## 2018-01-24 DIAGNOSIS — N186 End stage renal disease: Secondary | ICD-10-CM | POA: Diagnosis not present

## 2018-01-24 DIAGNOSIS — E1129 Type 2 diabetes mellitus with other diabetic kidney complication: Secondary | ICD-10-CM | POA: Diagnosis not present

## 2018-01-27 DIAGNOSIS — N186 End stage renal disease: Secondary | ICD-10-CM | POA: Diagnosis not present

## 2018-01-27 DIAGNOSIS — D631 Anemia in chronic kidney disease: Secondary | ICD-10-CM | POA: Diagnosis not present

## 2018-01-27 DIAGNOSIS — D509 Iron deficiency anemia, unspecified: Secondary | ICD-10-CM | POA: Diagnosis not present

## 2018-01-27 DIAGNOSIS — N2581 Secondary hyperparathyroidism of renal origin: Secondary | ICD-10-CM | POA: Diagnosis not present

## 2018-01-27 DIAGNOSIS — Z992 Dependence on renal dialysis: Secondary | ICD-10-CM | POA: Diagnosis not present

## 2018-01-27 DIAGNOSIS — E1129 Type 2 diabetes mellitus with other diabetic kidney complication: Secondary | ICD-10-CM | POA: Diagnosis not present

## 2018-01-29 DIAGNOSIS — D509 Iron deficiency anemia, unspecified: Secondary | ICD-10-CM | POA: Diagnosis not present

## 2018-01-29 DIAGNOSIS — Z992 Dependence on renal dialysis: Secondary | ICD-10-CM | POA: Diagnosis not present

## 2018-01-29 DIAGNOSIS — N186 End stage renal disease: Secondary | ICD-10-CM | POA: Diagnosis not present

## 2018-01-29 DIAGNOSIS — D631 Anemia in chronic kidney disease: Secondary | ICD-10-CM | POA: Diagnosis not present

## 2018-01-29 DIAGNOSIS — E1129 Type 2 diabetes mellitus with other diabetic kidney complication: Secondary | ICD-10-CM | POA: Diagnosis not present

## 2018-01-29 DIAGNOSIS — N2581 Secondary hyperparathyroidism of renal origin: Secondary | ICD-10-CM | POA: Diagnosis not present

## 2018-01-30 DIAGNOSIS — I120 Hypertensive chronic kidney disease with stage 5 chronic kidney disease or end stage renal disease: Secondary | ICD-10-CM | POA: Diagnosis not present

## 2018-01-30 DIAGNOSIS — N186 End stage renal disease: Secondary | ICD-10-CM | POA: Diagnosis not present

## 2018-01-30 DIAGNOSIS — Z992 Dependence on renal dialysis: Secondary | ICD-10-CM | POA: Diagnosis not present

## 2018-01-31 DIAGNOSIS — Z992 Dependence on renal dialysis: Secondary | ICD-10-CM | POA: Diagnosis not present

## 2018-01-31 DIAGNOSIS — D509 Iron deficiency anemia, unspecified: Secondary | ICD-10-CM | POA: Diagnosis not present

## 2018-01-31 DIAGNOSIS — N186 End stage renal disease: Secondary | ICD-10-CM | POA: Diagnosis not present

## 2018-01-31 DIAGNOSIS — D631 Anemia in chronic kidney disease: Secondary | ICD-10-CM | POA: Diagnosis not present

## 2018-01-31 DIAGNOSIS — I120 Hypertensive chronic kidney disease with stage 5 chronic kidney disease or end stage renal disease: Secondary | ICD-10-CM | POA: Diagnosis not present

## 2018-01-31 DIAGNOSIS — N2581 Secondary hyperparathyroidism of renal origin: Secondary | ICD-10-CM | POA: Diagnosis not present

## 2018-01-31 DIAGNOSIS — E1129 Type 2 diabetes mellitus with other diabetic kidney complication: Secondary | ICD-10-CM | POA: Diagnosis not present

## 2018-02-03 DIAGNOSIS — D509 Iron deficiency anemia, unspecified: Secondary | ICD-10-CM | POA: Diagnosis not present

## 2018-02-03 DIAGNOSIS — N2581 Secondary hyperparathyroidism of renal origin: Secondary | ICD-10-CM | POA: Diagnosis not present

## 2018-02-03 DIAGNOSIS — E1129 Type 2 diabetes mellitus with other diabetic kidney complication: Secondary | ICD-10-CM | POA: Diagnosis not present

## 2018-02-03 DIAGNOSIS — N186 End stage renal disease: Secondary | ICD-10-CM | POA: Diagnosis not present

## 2018-02-03 DIAGNOSIS — D631 Anemia in chronic kidney disease: Secondary | ICD-10-CM | POA: Diagnosis not present

## 2018-02-03 DIAGNOSIS — Z992 Dependence on renal dialysis: Secondary | ICD-10-CM | POA: Diagnosis not present

## 2018-02-05 DIAGNOSIS — N186 End stage renal disease: Secondary | ICD-10-CM | POA: Diagnosis not present

## 2018-02-05 DIAGNOSIS — Z992 Dependence on renal dialysis: Secondary | ICD-10-CM | POA: Diagnosis not present

## 2018-02-05 DIAGNOSIS — D631 Anemia in chronic kidney disease: Secondary | ICD-10-CM | POA: Diagnosis not present

## 2018-02-05 DIAGNOSIS — D509 Iron deficiency anemia, unspecified: Secondary | ICD-10-CM | POA: Diagnosis not present

## 2018-02-05 DIAGNOSIS — E1129 Type 2 diabetes mellitus with other diabetic kidney complication: Secondary | ICD-10-CM | POA: Diagnosis not present

## 2018-02-05 DIAGNOSIS — N2581 Secondary hyperparathyroidism of renal origin: Secondary | ICD-10-CM | POA: Diagnosis not present

## 2018-02-07 DIAGNOSIS — D509 Iron deficiency anemia, unspecified: Secondary | ICD-10-CM | POA: Diagnosis not present

## 2018-02-07 DIAGNOSIS — Z992 Dependence on renal dialysis: Secondary | ICD-10-CM | POA: Diagnosis not present

## 2018-02-07 DIAGNOSIS — N2581 Secondary hyperparathyroidism of renal origin: Secondary | ICD-10-CM | POA: Diagnosis not present

## 2018-02-07 DIAGNOSIS — D631 Anemia in chronic kidney disease: Secondary | ICD-10-CM | POA: Diagnosis not present

## 2018-02-07 DIAGNOSIS — E1129 Type 2 diabetes mellitus with other diabetic kidney complication: Secondary | ICD-10-CM | POA: Diagnosis not present

## 2018-02-07 DIAGNOSIS — N186 End stage renal disease: Secondary | ICD-10-CM | POA: Diagnosis not present

## 2018-02-10 DIAGNOSIS — D631 Anemia in chronic kidney disease: Secondary | ICD-10-CM | POA: Diagnosis not present

## 2018-02-10 DIAGNOSIS — N2581 Secondary hyperparathyroidism of renal origin: Secondary | ICD-10-CM | POA: Diagnosis not present

## 2018-02-10 DIAGNOSIS — N186 End stage renal disease: Secondary | ICD-10-CM | POA: Diagnosis not present

## 2018-02-10 DIAGNOSIS — D509 Iron deficiency anemia, unspecified: Secondary | ICD-10-CM | POA: Diagnosis not present

## 2018-02-10 DIAGNOSIS — Z992 Dependence on renal dialysis: Secondary | ICD-10-CM | POA: Diagnosis not present

## 2018-02-10 DIAGNOSIS — E1129 Type 2 diabetes mellitus with other diabetic kidney complication: Secondary | ICD-10-CM | POA: Diagnosis not present

## 2018-02-12 DIAGNOSIS — E1129 Type 2 diabetes mellitus with other diabetic kidney complication: Secondary | ICD-10-CM | POA: Diagnosis not present

## 2018-02-12 DIAGNOSIS — D509 Iron deficiency anemia, unspecified: Secondary | ICD-10-CM | POA: Diagnosis not present

## 2018-02-12 DIAGNOSIS — Z992 Dependence on renal dialysis: Secondary | ICD-10-CM | POA: Diagnosis not present

## 2018-02-12 DIAGNOSIS — D631 Anemia in chronic kidney disease: Secondary | ICD-10-CM | POA: Diagnosis not present

## 2018-02-12 DIAGNOSIS — N186 End stage renal disease: Secondary | ICD-10-CM | POA: Diagnosis not present

## 2018-02-12 DIAGNOSIS — N2581 Secondary hyperparathyroidism of renal origin: Secondary | ICD-10-CM | POA: Diagnosis not present

## 2018-02-14 DIAGNOSIS — D631 Anemia in chronic kidney disease: Secondary | ICD-10-CM | POA: Diagnosis not present

## 2018-02-14 DIAGNOSIS — N186 End stage renal disease: Secondary | ICD-10-CM | POA: Diagnosis not present

## 2018-02-14 DIAGNOSIS — N2581 Secondary hyperparathyroidism of renal origin: Secondary | ICD-10-CM | POA: Diagnosis not present

## 2018-02-14 DIAGNOSIS — D509 Iron deficiency anemia, unspecified: Secondary | ICD-10-CM | POA: Diagnosis not present

## 2018-02-14 DIAGNOSIS — E1129 Type 2 diabetes mellitus with other diabetic kidney complication: Secondary | ICD-10-CM | POA: Diagnosis not present

## 2018-02-14 DIAGNOSIS — Z992 Dependence on renal dialysis: Secondary | ICD-10-CM | POA: Diagnosis not present

## 2018-02-17 ENCOUNTER — Encounter: Payer: Self-pay | Admitting: Physician Assistant

## 2018-02-17 DIAGNOSIS — N2581 Secondary hyperparathyroidism of renal origin: Secondary | ICD-10-CM | POA: Diagnosis not present

## 2018-02-17 DIAGNOSIS — D509 Iron deficiency anemia, unspecified: Secondary | ICD-10-CM | POA: Diagnosis not present

## 2018-02-17 DIAGNOSIS — Z992 Dependence on renal dialysis: Secondary | ICD-10-CM | POA: Diagnosis not present

## 2018-02-17 DIAGNOSIS — E1129 Type 2 diabetes mellitus with other diabetic kidney complication: Secondary | ICD-10-CM | POA: Diagnosis not present

## 2018-02-17 DIAGNOSIS — D631 Anemia in chronic kidney disease: Secondary | ICD-10-CM | POA: Diagnosis not present

## 2018-02-17 DIAGNOSIS — N186 End stage renal disease: Secondary | ICD-10-CM | POA: Diagnosis not present
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
## 2018-02-18 ENCOUNTER — Encounter: Payer: Self-pay | Admitting: Physician Assistant

## 2018-02-18 ENCOUNTER — Ambulatory Visit (INDEPENDENT_AMBULATORY_CARE_PROVIDER_SITE_OTHER): Payer: Medicare Other | Admitting: Physician Assistant

## 2018-02-18 VITALS — BP 108/84 | HR 73 | Ht 59.0 in | Wt 124.6 lb

## 2018-02-18 DIAGNOSIS — R002 Palpitations: Secondary | ICD-10-CM

## 2018-02-18 DIAGNOSIS — I272 Pulmonary hypertension, unspecified: Secondary | ICD-10-CM

## 2018-02-18 DIAGNOSIS — I10 Essential (primary) hypertension: Secondary | ICD-10-CM

## 2018-02-18 DIAGNOSIS — N186 End stage renal disease: Secondary | ICD-10-CM

## 2018-02-18 DIAGNOSIS — Z992 Dependence on renal dialysis: Secondary | ICD-10-CM | POA: Diagnosis not present

## 2018-02-18 NOTE — Patient Instructions (Signed)
Medication Instructions:  Your physician recommends that you continue on your current medications as directed. Please refer to the Current Medication list given to you today.   Labwork: TODAY:  BMET, CBC, TSH, &  MAGNESIUM  Testing/Procedures: Your physician has requested that you have an echocardiogram. Echocardiography is a painless test that uses sound waves to create images of your heart. It provides your doctor with information about the size and shape of your heart and how well your heart's chambers and valves are working. This procedure takes approximately one hour. There are no restrictions for this procedure.  Your physician has recommended that you wear an event monitor. Event monitors are medical devices that record the heart's electrical activity. Doctors most often [place] these monitors to diagnose arrhythmias. Arrhythmias are problems with the speed or rhythm of the heartbeat. The monitor is a small, portable device. You can wear one while you do your normal daily activities. This is usually used to diagnose what is causing palpitations/syncope (passing out).    Follow-Up: Your physician recommends that you schedule a follow-up appointment in: 6-7 WEEKS WITH DAYNA DUNN, PA-C   Any Other Special Instructions Will Be Listed Below (If Applicable). Echocardiogram An echocardiogram, or echocardiography, uses sound waves (ultrasound) to produce an image of your heart. The echocardiogram is simple, painless, obtained within a short period of time, and offers valuable information to your health care provider. The images from an echocardiogram can provide information such as:  Evidence of coronary artery disease (CAD).  Heart size.  Heart muscle function.  Heart valve function.  Aneurysm detection.  Evidence of a past heart attack.  Fluid buildup around the heart.  Heart muscle thickening.  Assess heart valve function.  Tell a health care provider about:  Any allergies you  have.  All medicines you are taking, including vitamins, herbs, eye drops, creams, and over-the-counter medicines.  Any problems you or family members have had with anesthetic medicines.  Any blood disorders you have.  Any surgeries you have had.  Any medical conditions you have.  Whether you are pregnant or may be pregnant. What happens before the procedure? No special preparation is needed. Eat and drink normally. What happens during the procedure?  In order to produce an image of your heart, gel will be applied to your chest and a wand-like tool (transducer) will be moved over your chest. The gel will help transmit the sound waves from the transducer. The sound waves will harmlessly bounce off your heart to allow the heart images to be captured in real-time motion. These images will then be recorded.  You may need an IV to receive a medicine that improves the quality of the pictures. What happens after the procedure? You may return to your normal schedule including diet, activities, and medicines, unless your health care provider tells you otherwise. This information is not intended to replace advice given to you by your health care provider. Make sure you discuss any questions you have with your health care provider. Document Released: 12/14/2000 Document Revised: 08/04/2016 Document Reviewed: 08/24/2013 Elsevier Interactive Patient Education  2017 [employer].     Cardiac Event Monitoring A cardiac event monitor is a small recording device that is used to detect abnormal heart rhythms (arrhythmias). The monitor is used to record your heart rhythm when you have symptoms, such as:  Fast heartbeats (palpitations), such as heart racing or fluttering.  Dizziness.  Fainting or light-headedness.  Unexplained weakness.  Some monitors are wired to electrodes placed  on your chest. Electrodes are flat, sticky disks that attach to your skin. Other monitors may be hand-held or worn  on the wrist. The monitor can be worn for up to 30 days. If the monitor is attached to your chest, a technician will prepare your chest for the electrode placement and show you how to work the monitor. Take time to practice using the monitor before you leave the office. Make sure you understand how to send the information from the monitor to your health care provider. In some cases, you may need to use a landline telephone instead of a cell phone. What are the risks? Generally, this device is safe to use, but it possible that the skin under the electrodes will become irritated. How to use your cardiac event monitor  Wear your monitor at all times, except when you are in water: ? Do not let the monitor get wet. ? Take the monitor off when you bathe. Do not swim or use a hot tub with it on.  Keep your skin clean. Do not put body lotion or moisturizer on your chest.  Change the electrodes as told by your health care provider or any time they stop sticking to your skin. You may need to use medical tape to keep them on.  Try to put the electrodes in slightly different places on your chest to help prevent skin irritation. They must remain in the area under your left breast and in the upper right section of your chest.  Make sure the monitor is safely clipped to your clothing or in a location close to your body that your health care provider recommends.  Press the button to record as soon as you feel heart-related symptoms, such as: ? Dizziness. ? Weakness. ? Light-headedness. ? Palpitations. ? Thumping or pounding in your chest. ? Shortness of breath. ? Unexplained weakness.  Keep a diary of your activities, such as walking, doing chores, and taking medicine. It is very important to note what you were doing when you pushed the button to record your symptoms. This will help your health care provider determine what might be contributing to your symptoms.  Send the recorded information as  recommended by your health care provider. It may take some time for your health care provider to process the results.  Change the batteries as told by your health care provider.  Keep electronic devices away from your monitor. This includes: ? Tablets. ? MP3 players. ? Cell phones.  While wearing your monitor you should avoid: ? Electric blankets. ? Armed forces operational officer. ? Electric toothbrushes. ? Microwave ovens. ? Magnets. ? Metal detectors. Get help right away if:  You have chest pain.  You have extreme difficulty breathing or shortness of breath.  You develop a very fast heartbeat that persists.  You develop dizziness that does not go away.  You faint or constantly feel like you are about to faint. Summary  A cardiac event monitor is a small recording device that is used to help detect abnormal heart rhythms (arrhythmias).  The monitor is used to record your heart rhythm when you have heart-related symptoms.  Make sure you understand how to send the information from the monitor to your health care provider.  It is important to press the button on the monitor when you have any heart-related symptoms.  Keep a diary of your activities, such as walking, doing chores, and taking medicine. It is very important to note what you were doing when you pushed the  button to record your symptoms. This will help your health care provider learn what might be causing your symptoms. This information is not intended to replace advice given to you by your health care provider. Make sure you discuss any questions you have with your health care provider. Document Released: 09/25/2008 Document Revised: 12/01/2016 Document Reviewed: 12/01/2016 Elsevier Interactive Patient Education  2017 [employer].    If you need a refill on your cardiac medications before your next appointment, please call your pharmacy.

## 2018-02-19 DIAGNOSIS — Z992 Dependence on renal dialysis: Secondary | ICD-10-CM | POA: Diagnosis not present

## 2018-02-19 DIAGNOSIS — D509 Iron deficiency anemia, unspecified: Secondary | ICD-10-CM | POA: Diagnosis not present

## 2018-02-19 DIAGNOSIS — D631 Anemia in chronic kidney disease: Secondary | ICD-10-CM | POA: Diagnosis not present

## 2018-02-19 DIAGNOSIS — N2581 Secondary hyperparathyroidism of renal origin: Secondary | ICD-10-CM | POA: Diagnosis not present

## 2018-02-19 DIAGNOSIS — E1129 Type 2 diabetes mellitus with other diabetic kidney complication: Secondary | ICD-10-CM | POA: Diagnosis not present

## 2018-02-19 DIAGNOSIS — N186 End stage renal disease: Secondary | ICD-10-CM | POA: Diagnosis not present

## 2018-02-19 LAB — BASIC METABOLIC PANEL
BUN / CREAT RATIO: 6 — AB (ref 12–28)
BUN: 37 mg/dL — ABNORMAL HIGH (ref 8–27)
CHLORIDE: 96 mmol/L (ref 96–106)
CO2: 19 mmol/L — ABNORMAL LOW (ref 20–29)
Calcium: 9.4 mg/dL (ref 8.7–10.3)
Creatinine, Ser: 6.19 mg/dL — ABNORMAL HIGH (ref 0.57–1.00)
GFR calc non Af Amer: 6 mL/min/{1.73_m2} — ABNORMAL LOW (ref >59)
GFR, EST AFRICAN AMERICAN: 7 mL/min/{1.73_m2} — AB (ref >59)
GLUCOSE: 98 mg/dL (ref 65–99)
Potassium: 4 mmol/L (ref 3.5–5.2)
Sodium: 140 mmol/L (ref 134–144)

## 2018-02-19 LAB — CBC
Hematocrit: 37.3 % (ref 34.0–46.6)
Hemoglobin: 12.3 g/dL (ref 11.1–15.9)
MCH: 30.5 pg (ref 26.6–33.0)
MCHC: 33 g/dL (ref 31.5–35.7)
MCV: 93 fL (ref 79–97)
Platelets: 270 10*3/uL (ref 150–379)
RBC: 4.03 x10E6/uL (ref 3.77–5.28)
RDW: 17.1 % — AB (ref 12.3–15.4)
WBC: 4.8 10*3/uL (ref 3.4–10.8)

## 2018-02-19 LAB — MAGNESIUM: MAGNESIUM: 2.6 mg/dL — AB (ref 1.6–2.3)

## 2018-02-19 LAB — TSH: TSH: 2.84 u[IU]/mL (ref 0.450–4.500)

## 2018-02-21 DIAGNOSIS — D631 Anemia in chronic kidney disease: Secondary | ICD-10-CM | POA: Diagnosis not present

## 2018-02-21 DIAGNOSIS — D509 Iron deficiency anemia, unspecified: Secondary | ICD-10-CM | POA: Diagnosis not present

## 2018-02-21 DIAGNOSIS — N2581 Secondary hyperparathyroidism of renal origin: Secondary | ICD-10-CM | POA: Diagnosis not present

## 2018-02-21 DIAGNOSIS — E1129 Type 2 diabetes mellitus with other diabetic kidney complication: Secondary | ICD-10-CM | POA: Diagnosis not present

## 2018-02-21 DIAGNOSIS — Z992 Dependence on renal dialysis: Secondary | ICD-10-CM | POA: Diagnosis not present

## 2018-02-21 DIAGNOSIS — N186 End stage renal disease: Secondary | ICD-10-CM | POA: Diagnosis not present

## 2018-02-24 DIAGNOSIS — D509 Iron deficiency anemia, unspecified: Secondary | ICD-10-CM | POA: Diagnosis not present

## 2018-02-24 DIAGNOSIS — N2581 Secondary hyperparathyroidism of renal origin: Secondary | ICD-10-CM | POA: Diagnosis not present

## 2018-02-24 DIAGNOSIS — D631 Anemia in chronic kidney disease: Secondary | ICD-10-CM | POA: Diagnosis not present

## 2018-02-24 DIAGNOSIS — Z992 Dependence on renal dialysis: Secondary | ICD-10-CM | POA: Diagnosis not present

## 2018-02-24 DIAGNOSIS — E1129 Type 2 diabetes mellitus with other diabetic kidney complication: Secondary | ICD-10-CM | POA: Diagnosis not present

## 2018-02-24 DIAGNOSIS — N186 End stage renal disease: Secondary | ICD-10-CM | POA: Diagnosis not present

## 2018-02-26 DIAGNOSIS — E1129 Type 2 diabetes mellitus with other diabetic kidney complication: Secondary | ICD-10-CM | POA: Diagnosis not present

## 2018-02-26 DIAGNOSIS — N2581 Secondary hyperparathyroidism of renal origin: Secondary | ICD-10-CM | POA: Diagnosis not present

## 2018-02-26 DIAGNOSIS — N186 End stage renal disease: Secondary | ICD-10-CM | POA: Diagnosis not present

## 2018-02-26 DIAGNOSIS — Z992 Dependence on renal dialysis: Secondary | ICD-10-CM | POA: Diagnosis not present

## 2018-02-26 DIAGNOSIS — D631 Anemia in chronic kidney disease: Secondary | ICD-10-CM | POA: Diagnosis not present

## 2018-02-26 DIAGNOSIS — D509 Iron deficiency anemia, unspecified: Secondary | ICD-10-CM | POA: Diagnosis not present

## 2018-02-27 ENCOUNTER — Ambulatory Visit (INDEPENDENT_AMBULATORY_CARE_PROVIDER_SITE_OTHER): Payer: Medicare Other

## 2018-02-27 ENCOUNTER — Ambulatory Visit (HOSPITAL_COMMUNITY): Payer: Medicare Other | Attending: Cardiovascular Disease

## 2018-02-27 ENCOUNTER — Other Ambulatory Visit: Payer: Self-pay | Admitting: Physician Assistant

## 2018-02-27 ENCOUNTER — Other Ambulatory Visit: Payer: Self-pay

## 2018-02-27 DIAGNOSIS — R0602 Shortness of breath: Secondary | ICD-10-CM

## 2018-02-27 DIAGNOSIS — R002 Palpitations: Secondary | ICD-10-CM

## 2018-02-27 DIAGNOSIS — I082 Rheumatic disorders of both aortic and tricuspid valves: Secondary | ICD-10-CM | POA: Insufficient documentation

## 2018-02-28 DIAGNOSIS — D509 Iron deficiency anemia, unspecified: Secondary | ICD-10-CM | POA: Diagnosis not present

## 2018-02-28 DIAGNOSIS — Z992 Dependence on renal dialysis: Secondary | ICD-10-CM | POA: Diagnosis not present

## 2018-02-28 DIAGNOSIS — E1129 Type 2 diabetes mellitus with other diabetic kidney complication: Secondary | ICD-10-CM | POA: Diagnosis not present

## 2018-02-28 DIAGNOSIS — D631 Anemia in chronic kidney disease: Secondary | ICD-10-CM | POA: Diagnosis not present

## 2018-02-28 DIAGNOSIS — I120 Hypertensive chronic kidney disease with stage 5 chronic kidney disease or end stage renal disease: Secondary | ICD-10-CM | POA: Diagnosis not present

## 2018-02-28 DIAGNOSIS — N2581 Secondary hyperparathyroidism of renal origin: Secondary | ICD-10-CM | POA: Diagnosis not present

## 2018-02-28 DIAGNOSIS — N186 End stage renal disease: Secondary | ICD-10-CM | POA: Diagnosis not present

## 2018-03-03 DIAGNOSIS — N2581 Secondary hyperparathyroidism of renal origin: Secondary | ICD-10-CM | POA: Diagnosis not present

## 2018-03-03 DIAGNOSIS — D631 Anemia in chronic kidney disease: Secondary | ICD-10-CM | POA: Diagnosis not present

## 2018-03-03 DIAGNOSIS — D509 Iron deficiency anemia, unspecified: Secondary | ICD-10-CM | POA: Diagnosis not present

## 2018-03-03 DIAGNOSIS — Z992 Dependence on renal dialysis: Secondary | ICD-10-CM | POA: Diagnosis not present

## 2018-03-03 DIAGNOSIS — E1129 Type 2 diabetes mellitus with other diabetic kidney complication: Secondary | ICD-10-CM | POA: Diagnosis not present

## 2018-03-03 DIAGNOSIS — N186 End stage renal disease: Secondary | ICD-10-CM | POA: Diagnosis not present

## 2018-03-05 DIAGNOSIS — E1129 Type 2 diabetes mellitus with other diabetic kidney complication: Secondary | ICD-10-CM | POA: Diagnosis not present

## 2018-03-05 DIAGNOSIS — N2581 Secondary hyperparathyroidism of renal origin: Secondary | ICD-10-CM | POA: Diagnosis not present

## 2018-03-05 DIAGNOSIS — N186 End stage renal disease: Secondary | ICD-10-CM | POA: Diagnosis not present

## 2018-03-05 DIAGNOSIS — D509 Iron deficiency anemia, unspecified: Secondary | ICD-10-CM | POA: Diagnosis not present

## 2018-03-05 DIAGNOSIS — D631 Anemia in chronic kidney disease: Secondary | ICD-10-CM | POA: Diagnosis not present

## 2018-03-05 DIAGNOSIS — Z992 Dependence on renal dialysis: Secondary | ICD-10-CM | POA: Diagnosis not present

## 2018-03-07 DIAGNOSIS — N2581 Secondary hyperparathyroidism of renal origin: Secondary | ICD-10-CM | POA: Diagnosis not present

## 2018-03-07 DIAGNOSIS — N186 End stage renal disease: Secondary | ICD-10-CM | POA: Diagnosis not present

## 2018-03-07 DIAGNOSIS — E1129 Type 2 diabetes mellitus with other diabetic kidney complication: Secondary | ICD-10-CM | POA: Diagnosis not present

## 2018-03-07 DIAGNOSIS — D509 Iron deficiency anemia, unspecified: Secondary | ICD-10-CM | POA: Diagnosis not present

## 2018-03-07 DIAGNOSIS — D631 Anemia in chronic kidney disease: Secondary | ICD-10-CM | POA: Diagnosis not present

## 2018-03-07 DIAGNOSIS — Z992 Dependence on renal dialysis: Secondary | ICD-10-CM | POA: Diagnosis not present

## 2018-03-10 DIAGNOSIS — N2581 Secondary hyperparathyroidism of renal origin: Secondary | ICD-10-CM | POA: Diagnosis not present

## 2018-03-10 DIAGNOSIS — Z992 Dependence on renal dialysis: Secondary | ICD-10-CM | POA: Diagnosis not present

## 2018-03-10 DIAGNOSIS — N186 End stage renal disease: Secondary | ICD-10-CM | POA: Diagnosis not present

## 2018-03-10 DIAGNOSIS — D631 Anemia in chronic kidney disease: Secondary | ICD-10-CM | POA: Diagnosis not present

## 2018-03-10 DIAGNOSIS — D509 Iron deficiency anemia, unspecified: Secondary | ICD-10-CM | POA: Diagnosis not present

## 2018-03-10 DIAGNOSIS — E1129 Type 2 diabetes mellitus with other diabetic kidney complication: Secondary | ICD-10-CM | POA: Diagnosis not present

## 2018-03-12 ENCOUNTER — Telehealth: Payer: Self-pay

## 2018-03-12 DIAGNOSIS — D631 Anemia in chronic kidney disease: Secondary | ICD-10-CM | POA: Diagnosis not present

## 2018-03-12 DIAGNOSIS — N186 End stage renal disease: Secondary | ICD-10-CM | POA: Diagnosis not present

## 2018-03-12 DIAGNOSIS — E1129 Type 2 diabetes mellitus with other diabetic kidney complication: Secondary | ICD-10-CM | POA: Diagnosis not present

## 2018-03-12 DIAGNOSIS — Z992 Dependence on renal dialysis: Secondary | ICD-10-CM | POA: Diagnosis not present

## 2018-03-12 DIAGNOSIS — D509 Iron deficiency anemia, unspecified: Secondary | ICD-10-CM | POA: Diagnosis not present

## 2018-03-12 DIAGNOSIS — N2581 Secondary hyperparathyroidism of renal origin: Secondary | ICD-10-CM | POA: Diagnosis not present

## 2018-03-12 NOTE — Telephone Encounter (Signed)
Call to [name]. She is on dialysis but just got home. States she will come in on a Tuesday for AWV;  Will schedule with [facility] Apt for 03/26 at 10am

## 2018-03-14 DIAGNOSIS — D509 Iron deficiency anemia, unspecified: Secondary | ICD-10-CM | POA: Diagnosis not present

## 2018-03-14 DIAGNOSIS — D631 Anemia in chronic kidney disease: Secondary | ICD-10-CM | POA: Diagnosis not present

## 2018-03-14 DIAGNOSIS — E1129 Type 2 diabetes mellitus with other diabetic kidney complication: Secondary | ICD-10-CM | POA: Diagnosis not present

## 2018-03-14 DIAGNOSIS — Z992 Dependence on renal dialysis: Secondary | ICD-10-CM | POA: Diagnosis not present

## 2018-03-14 DIAGNOSIS — N2581 Secondary hyperparathyroidism of renal origin: Secondary | ICD-10-CM | POA: Diagnosis not present

## 2018-03-14 DIAGNOSIS — N186 End stage renal disease: Secondary | ICD-10-CM | POA: Diagnosis not present

## 2018-03-17 DIAGNOSIS — Z992 Dependence on renal dialysis: Secondary | ICD-10-CM | POA: Diagnosis not present

## 2018-03-17 DIAGNOSIS — D631 Anemia in chronic kidney disease: Secondary | ICD-10-CM | POA: Diagnosis not present

## 2018-03-17 DIAGNOSIS — N2581 Secondary hyperparathyroidism of renal origin: Secondary | ICD-10-CM | POA: Diagnosis not present

## 2018-03-17 DIAGNOSIS — D509 Iron deficiency anemia, unspecified: Secondary | ICD-10-CM | POA: Diagnosis not present

## 2018-03-17 DIAGNOSIS — N186 End stage renal disease: Secondary | ICD-10-CM | POA: Diagnosis not present

## 2018-03-17 DIAGNOSIS — E1129 Type 2 diabetes mellitus with other diabetic kidney complication: Secondary | ICD-10-CM | POA: Diagnosis not present

## 2018-03-19 DIAGNOSIS — Z992 Dependence on renal dialysis: Secondary | ICD-10-CM | POA: Diagnosis not present

## 2018-03-19 DIAGNOSIS — D631 Anemia in chronic kidney disease: Secondary | ICD-10-CM | POA: Diagnosis not present

## 2018-03-19 DIAGNOSIS — N2581 Secondary hyperparathyroidism of renal origin: Secondary | ICD-10-CM | POA: Diagnosis not present

## 2018-03-19 DIAGNOSIS — N186 End stage renal disease: Secondary | ICD-10-CM | POA: Diagnosis not present

## 2018-03-19 DIAGNOSIS — D509 Iron deficiency anemia, unspecified: Secondary | ICD-10-CM | POA: Diagnosis not present

## 2018-03-19 DIAGNOSIS — E1129 Type 2 diabetes mellitus with other diabetic kidney complication: Secondary | ICD-10-CM | POA: Diagnosis not present

## 2018-03-21 DIAGNOSIS — N2581 Secondary hyperparathyroidism of renal origin: Secondary | ICD-10-CM | POA: Diagnosis not present

## 2018-03-21 DIAGNOSIS — E1129 Type 2 diabetes mellitus with other diabetic kidney complication: Secondary | ICD-10-CM | POA: Diagnosis not present

## 2018-03-21 DIAGNOSIS — Z992 Dependence on renal dialysis: Secondary | ICD-10-CM | POA: Diagnosis not present

## 2018-03-21 DIAGNOSIS — D631 Anemia in chronic kidney disease: Secondary | ICD-10-CM | POA: Diagnosis not present

## 2018-03-21 DIAGNOSIS — N186 End stage renal disease: Secondary | ICD-10-CM | POA: Diagnosis not present

## 2018-03-21 DIAGNOSIS — D509 Iron deficiency anemia, unspecified: Secondary | ICD-10-CM | POA: Diagnosis not present

## 2018-03-24 DIAGNOSIS — D631 Anemia in chronic kidney disease: Secondary | ICD-10-CM | POA: Diagnosis not present

## 2018-03-24 DIAGNOSIS — Z992 Dependence on renal dialysis: Secondary | ICD-10-CM | POA: Diagnosis not present

## 2018-03-24 DIAGNOSIS — E1129 Type 2 diabetes mellitus with other diabetic kidney complication: Secondary | ICD-10-CM | POA: Diagnosis not present

## 2018-03-24 DIAGNOSIS — N186 End stage renal disease: Secondary | ICD-10-CM | POA: Diagnosis not present

## 2018-03-24 DIAGNOSIS — N2581 Secondary hyperparathyroidism of renal origin: Secondary | ICD-10-CM | POA: Diagnosis not present

## 2018-03-24 DIAGNOSIS — D509 Iron deficiency anemia, unspecified: Secondary | ICD-10-CM | POA: Diagnosis not present

## 2018-03-25 ENCOUNTER — Ambulatory Visit: Payer: Medicare Other

## 2018-03-26 DIAGNOSIS — E1129 Type 2 diabetes mellitus with other diabetic kidney complication: Secondary | ICD-10-CM | POA: Diagnosis not present

## 2018-03-26 DIAGNOSIS — N186 End stage renal disease: Secondary | ICD-10-CM | POA: Diagnosis not present

## 2018-03-26 DIAGNOSIS — Z992 Dependence on renal dialysis: Secondary | ICD-10-CM | POA: Diagnosis not present

## 2018-03-26 DIAGNOSIS — D631 Anemia in chronic kidney disease: Secondary | ICD-10-CM | POA: Diagnosis not present

## 2018-03-26 DIAGNOSIS — D509 Iron deficiency anemia, unspecified: Secondary | ICD-10-CM | POA: Diagnosis not present

## 2018-03-26 DIAGNOSIS — N2581 Secondary hyperparathyroidism of renal origin: Secondary | ICD-10-CM | POA: Diagnosis not present

## 2018-03-28 DIAGNOSIS — E1129 Type 2 diabetes mellitus with other diabetic kidney complication: Secondary | ICD-10-CM | POA: Diagnosis not present

## 2018-03-28 DIAGNOSIS — Z992 Dependence on renal dialysis: Secondary | ICD-10-CM | POA: Diagnosis not present

## 2018-03-28 DIAGNOSIS — D631 Anemia in chronic kidney disease: Secondary | ICD-10-CM | POA: Diagnosis not present

## 2018-03-28 DIAGNOSIS — N186 End stage renal disease: Secondary | ICD-10-CM | POA: Diagnosis not present

## 2018-03-28 DIAGNOSIS — D509 Iron deficiency anemia, unspecified: Secondary | ICD-10-CM | POA: Diagnosis not present

## 2018-03-28 DIAGNOSIS — N2581 Secondary hyperparathyroidism of renal origin: Secondary | ICD-10-CM | POA: Diagnosis not present

## 2018-03-31 DIAGNOSIS — I120 Hypertensive chronic kidney disease with stage 5 chronic kidney disease or end stage renal disease: Secondary | ICD-10-CM | POA: Diagnosis not present

## 2018-03-31 DIAGNOSIS — Z992 Dependence on renal dialysis: Secondary | ICD-10-CM | POA: Diagnosis not present

## 2018-03-31 DIAGNOSIS — N186 End stage renal disease: Secondary | ICD-10-CM | POA: Diagnosis not present

## 2018-04-02 DIAGNOSIS — D631 Anemia in chronic kidney disease: Secondary | ICD-10-CM | POA: Diagnosis not present

## 2018-04-02 DIAGNOSIS — E1129 Type 2 diabetes mellitus with other diabetic kidney complication: Secondary | ICD-10-CM | POA: Diagnosis not present

## 2018-04-02 DIAGNOSIS — N186 End stage renal disease: Secondary | ICD-10-CM | POA: Diagnosis not present

## 2018-04-02 DIAGNOSIS — N2581 Secondary hyperparathyroidism of renal origin: Secondary | ICD-10-CM | POA: Diagnosis not present

## 2018-04-02 DIAGNOSIS — Z992 Dependence on renal dialysis: Secondary | ICD-10-CM | POA: Diagnosis not present

## 2018-04-04 DIAGNOSIS — N186 End stage renal disease: Secondary | ICD-10-CM | POA: Diagnosis not present

## 2018-04-04 DIAGNOSIS — Z992 Dependence on renal dialysis: Secondary | ICD-10-CM | POA: Diagnosis not present

## 2018-04-04 DIAGNOSIS — D631 Anemia in chronic kidney disease: Secondary | ICD-10-CM | POA: Diagnosis not present

## 2018-04-04 DIAGNOSIS — N2581 Secondary hyperparathyroidism of renal origin: Secondary | ICD-10-CM | POA: Diagnosis not present

## 2018-04-04 DIAGNOSIS — E1129 Type 2 diabetes mellitus with other diabetic kidney complication: Secondary | ICD-10-CM | POA: Diagnosis not present

## 2018-04-07 DIAGNOSIS — Z992 Dependence on renal dialysis: Secondary | ICD-10-CM | POA: Diagnosis not present

## 2018-04-07 DIAGNOSIS — N2581 Secondary hyperparathyroidism of renal origin: Secondary | ICD-10-CM | POA: Diagnosis not present

## 2018-04-07 DIAGNOSIS — D631 Anemia in chronic kidney disease: Secondary | ICD-10-CM | POA: Diagnosis not present

## 2018-04-07 DIAGNOSIS — E1129 Type 2 diabetes mellitus with other diabetic kidney complication: Secondary | ICD-10-CM | POA: Diagnosis not present

## 2018-04-07 DIAGNOSIS — N186 End stage renal disease: Secondary | ICD-10-CM | POA: Diagnosis not present

## 2018-04-10 ENCOUNTER — Ambulatory Visit: Payer: Medicare Other | Admitting: Physician Assistant

## 2018-04-11 DIAGNOSIS — E1129 Type 2 diabetes mellitus with other diabetic kidney complication: Secondary | ICD-10-CM | POA: Diagnosis not present

## 2018-04-11 DIAGNOSIS — D631 Anemia in chronic kidney disease: Secondary | ICD-10-CM | POA: Diagnosis not present

## 2018-04-11 DIAGNOSIS — N2581 Secondary hyperparathyroidism of renal origin: Secondary | ICD-10-CM | POA: Diagnosis not present

## 2018-04-11 DIAGNOSIS — N186 End stage renal disease: Secondary | ICD-10-CM | POA: Diagnosis not present

## 2018-04-11 DIAGNOSIS — Z992 Dependence on renal dialysis: Secondary | ICD-10-CM | POA: Diagnosis not present

## 2018-04-14 DIAGNOSIS — N186 End stage renal disease: Secondary | ICD-10-CM | POA: Diagnosis not present

## 2018-04-14 DIAGNOSIS — E1129 Type 2 diabetes mellitus with other diabetic kidney complication: Secondary | ICD-10-CM | POA: Diagnosis not present

## 2018-04-14 DIAGNOSIS — D631 Anemia in chronic kidney disease: Secondary | ICD-10-CM | POA: Diagnosis not present

## 2018-04-14 DIAGNOSIS — Z992 Dependence on renal dialysis: Secondary | ICD-10-CM | POA: Diagnosis not present

## 2018-04-14 DIAGNOSIS — N2581 Secondary hyperparathyroidism of renal origin: Secondary | ICD-10-CM | POA: Diagnosis not present

## 2018-04-16 DIAGNOSIS — D631 Anemia in chronic kidney disease: Secondary | ICD-10-CM | POA: Diagnosis not present

## 2018-04-16 DIAGNOSIS — E1129 Type 2 diabetes mellitus with other diabetic kidney complication: Secondary | ICD-10-CM | POA: Diagnosis not present

## 2018-04-16 DIAGNOSIS — N2581 Secondary hyperparathyroidism of renal origin: Secondary | ICD-10-CM | POA: Diagnosis not present

## 2018-04-16 DIAGNOSIS — Z992 Dependence on renal dialysis: Secondary | ICD-10-CM | POA: Diagnosis not present

## 2018-04-16 DIAGNOSIS — N186 End stage renal disease: Secondary | ICD-10-CM | POA: Diagnosis not present

## 2018-04-18 DIAGNOSIS — E1129 Type 2 diabetes mellitus with other diabetic kidney complication: Secondary | ICD-10-CM | POA: Diagnosis not present

## 2018-04-18 DIAGNOSIS — Z992 Dependence on renal dialysis: Secondary | ICD-10-CM | POA: Diagnosis not present

## 2018-04-18 DIAGNOSIS — D631 Anemia in chronic kidney disease: Secondary | ICD-10-CM | POA: Diagnosis not present

## 2018-04-18 DIAGNOSIS — N2581 Secondary hyperparathyroidism of renal origin: Secondary | ICD-10-CM | POA: Diagnosis not present

## 2018-04-18 DIAGNOSIS — N186 End stage renal disease: Secondary | ICD-10-CM | POA: Diagnosis not present

## 2018-04-21 DIAGNOSIS — N186 End stage renal disease: Secondary | ICD-10-CM | POA: Diagnosis not present

## 2018-04-21 DIAGNOSIS — E1129 Type 2 diabetes mellitus with other diabetic kidney complication: Secondary | ICD-10-CM | POA: Diagnosis not present

## 2018-04-21 DIAGNOSIS — D631 Anemia in chronic kidney disease: Secondary | ICD-10-CM | POA: Diagnosis not present

## 2018-04-21 DIAGNOSIS — N2581 Secondary hyperparathyroidism of renal origin: Secondary | ICD-10-CM | POA: Diagnosis not present

## 2018-04-21 DIAGNOSIS — Z992 Dependence on renal dialysis: Secondary | ICD-10-CM | POA: Diagnosis not present

## 2018-04-23 DIAGNOSIS — D631 Anemia in chronic kidney disease: Secondary | ICD-10-CM | POA: Diagnosis not present

## 2018-04-23 DIAGNOSIS — E1129 Type 2 diabetes mellitus with other diabetic kidney complication: Secondary | ICD-10-CM | POA: Diagnosis not present

## 2018-04-23 DIAGNOSIS — N2581 Secondary hyperparathyroidism of renal origin: Secondary | ICD-10-CM | POA: Diagnosis not present

## 2018-04-23 DIAGNOSIS — Z992 Dependence on renal dialysis: Secondary | ICD-10-CM | POA: Diagnosis not present

## 2018-04-23 DIAGNOSIS — N186 End stage renal disease: Secondary | ICD-10-CM | POA: Diagnosis not present

## 2018-04-25 DIAGNOSIS — E1129 Type 2 diabetes mellitus with other diabetic kidney complication: Secondary | ICD-10-CM | POA: Diagnosis not present

## 2018-04-25 DIAGNOSIS — D631 Anemia in chronic kidney disease: Secondary | ICD-10-CM | POA: Diagnosis not present

## 2018-04-25 DIAGNOSIS — N2581 Secondary hyperparathyroidism of renal origin: Secondary | ICD-10-CM | POA: Diagnosis not present

## 2018-04-25 DIAGNOSIS — N186 End stage renal disease: Secondary | ICD-10-CM | POA: Diagnosis not present

## 2018-04-25 DIAGNOSIS — Z992 Dependence on renal dialysis: Secondary | ICD-10-CM | POA: Diagnosis not present

## 2018-04-28 DIAGNOSIS — Z992 Dependence on renal dialysis: Secondary | ICD-10-CM | POA: Diagnosis not present

## 2018-04-28 DIAGNOSIS — N2581 Secondary hyperparathyroidism of renal origin: Secondary | ICD-10-CM | POA: Diagnosis not present

## 2018-04-28 DIAGNOSIS — E1129 Type 2 diabetes mellitus with other diabetic kidney complication: Secondary | ICD-10-CM | POA: Diagnosis not present

## 2018-04-28 DIAGNOSIS — D631 Anemia in chronic kidney disease: Secondary | ICD-10-CM | POA: Diagnosis not present

## 2018-04-28 DIAGNOSIS — N186 End stage renal disease: Secondary | ICD-10-CM | POA: Diagnosis not present

## 2018-04-29 DIAGNOSIS — H40013 Open angle with borderline findings, low risk, bilateral: Secondary | ICD-10-CM | POA: Diagnosis not present

## 2018-04-30 DIAGNOSIS — N2581 Secondary hyperparathyroidism of renal origin: Secondary | ICD-10-CM | POA: Diagnosis not present

## 2018-04-30 DIAGNOSIS — I120 Hypertensive chronic kidney disease with stage 5 chronic kidney disease or end stage renal disease: Secondary | ICD-10-CM | POA: Diagnosis not present

## 2018-04-30 DIAGNOSIS — E1129 Type 2 diabetes mellitus with other diabetic kidney complication: Secondary | ICD-10-CM | POA: Diagnosis not present

## 2018-04-30 DIAGNOSIS — D631 Anemia in chronic kidney disease: Secondary | ICD-10-CM | POA: Diagnosis not present

## 2018-04-30 DIAGNOSIS — N186 End stage renal disease: Secondary | ICD-10-CM | POA: Diagnosis not present

## 2018-04-30 DIAGNOSIS — Z992 Dependence on renal dialysis: Secondary | ICD-10-CM | POA: Diagnosis not present

## 2018-05-02 DIAGNOSIS — N186 End stage renal disease: Secondary | ICD-10-CM | POA: Diagnosis not present

## 2018-05-02 DIAGNOSIS — D631 Anemia in chronic kidney disease: Secondary | ICD-10-CM | POA: Diagnosis not present

## 2018-05-02 DIAGNOSIS — N2581 Secondary hyperparathyroidism of renal origin: Secondary | ICD-10-CM | POA: Diagnosis not present

## 2018-05-02 DIAGNOSIS — Z992 Dependence on renal dialysis: Secondary | ICD-10-CM | POA: Diagnosis not present

## 2018-05-02 DIAGNOSIS — E1129 Type 2 diabetes mellitus with other diabetic kidney complication: Secondary | ICD-10-CM | POA: Diagnosis not present

## 2018-05-05 DIAGNOSIS — D631 Anemia in chronic kidney disease: Secondary | ICD-10-CM | POA: Diagnosis not present

## 2018-05-05 DIAGNOSIS — Z992 Dependence on renal dialysis: Secondary | ICD-10-CM | POA: Diagnosis not present

## 2018-05-05 DIAGNOSIS — N2581 Secondary hyperparathyroidism of renal origin: Secondary | ICD-10-CM | POA: Diagnosis not present

## 2018-05-05 DIAGNOSIS — E1129 Type 2 diabetes mellitus with other diabetic kidney complication: Secondary | ICD-10-CM | POA: Diagnosis not present

## 2018-05-05 DIAGNOSIS — N186 End stage renal disease: Secondary | ICD-10-CM | POA: Diagnosis not present

## 2018-05-07 DIAGNOSIS — D631 Anemia in chronic kidney disease: Secondary | ICD-10-CM | POA: Diagnosis not present

## 2018-05-07 DIAGNOSIS — N2581 Secondary hyperparathyroidism of renal origin: Secondary | ICD-10-CM | POA: Diagnosis not present

## 2018-05-07 DIAGNOSIS — E1129 Type 2 diabetes mellitus with other diabetic kidney complication: Secondary | ICD-10-CM | POA: Diagnosis not present

## 2018-05-07 DIAGNOSIS — N186 End stage renal disease: Secondary | ICD-10-CM | POA: Diagnosis not present

## 2018-05-07 DIAGNOSIS — Z992 Dependence on renal dialysis: Secondary | ICD-10-CM | POA: Diagnosis not present

## 2018-05-09 DIAGNOSIS — E1129 Type 2 diabetes mellitus with other diabetic kidney complication: Secondary | ICD-10-CM | POA: Diagnosis not present

## 2018-05-09 DIAGNOSIS — N186 End stage renal disease: Secondary | ICD-10-CM | POA: Diagnosis not present

## 2018-05-09 DIAGNOSIS — D631 Anemia in chronic kidney disease: Secondary | ICD-10-CM | POA: Diagnosis not present

## 2018-05-09 DIAGNOSIS — N2581 Secondary hyperparathyroidism of renal origin: Secondary | ICD-10-CM | POA: Diagnosis not present

## 2018-05-09 DIAGNOSIS — Z992 Dependence on renal dialysis: Secondary | ICD-10-CM | POA: Diagnosis not present

## 2018-05-12 DIAGNOSIS — E1129 Type 2 diabetes mellitus with other diabetic kidney complication: Secondary | ICD-10-CM | POA: Diagnosis not present

## 2018-05-12 DIAGNOSIS — N186 End stage renal disease: Secondary | ICD-10-CM | POA: Diagnosis not present

## 2018-05-12 DIAGNOSIS — Z992 Dependence on renal dialysis: Secondary | ICD-10-CM | POA: Diagnosis not present

## 2018-05-12 DIAGNOSIS — D631 Anemia in chronic kidney disease: Secondary | ICD-10-CM | POA: Diagnosis not present

## 2018-05-12 DIAGNOSIS — N2581 Secondary hyperparathyroidism of renal origin: Secondary | ICD-10-CM | POA: Diagnosis not present

## 2018-05-14 DIAGNOSIS — D631 Anemia in chronic kidney disease: Secondary | ICD-10-CM | POA: Diagnosis not present

## 2018-05-14 DIAGNOSIS — E1129 Type 2 diabetes mellitus with other diabetic kidney complication: Secondary | ICD-10-CM | POA: Diagnosis not present

## 2018-05-14 DIAGNOSIS — N2581 Secondary hyperparathyroidism of renal origin: Secondary | ICD-10-CM | POA: Diagnosis not present

## 2018-05-14 DIAGNOSIS — N186 End stage renal disease: Secondary | ICD-10-CM | POA: Diagnosis not present

## 2018-05-14 DIAGNOSIS — Z992 Dependence on renal dialysis: Secondary | ICD-10-CM | POA: Diagnosis not present

## 2018-05-16 DIAGNOSIS — E1129 Type 2 diabetes mellitus with other diabetic kidney complication: Secondary | ICD-10-CM | POA: Diagnosis not present

## 2018-05-16 DIAGNOSIS — D631 Anemia in chronic kidney disease: Secondary | ICD-10-CM | POA: Diagnosis not present

## 2018-05-16 DIAGNOSIS — N2581 Secondary hyperparathyroidism of renal origin: Secondary | ICD-10-CM | POA: Diagnosis not present

## 2018-05-16 DIAGNOSIS — Z992 Dependence on renal dialysis: Secondary | ICD-10-CM | POA: Diagnosis not present

## 2018-05-16 DIAGNOSIS — N186 End stage renal disease: Secondary | ICD-10-CM | POA: Diagnosis not present

## 2018-05-19 DIAGNOSIS — D631 Anemia in chronic kidney disease: Secondary | ICD-10-CM | POA: Diagnosis not present

## 2018-05-19 DIAGNOSIS — N186 End stage renal disease: Secondary | ICD-10-CM | POA: Diagnosis not present

## 2018-05-19 DIAGNOSIS — E1129 Type 2 diabetes mellitus with other diabetic kidney complication: Secondary | ICD-10-CM | POA: Diagnosis not present

## 2018-05-19 DIAGNOSIS — Z992 Dependence on renal dialysis: Secondary | ICD-10-CM | POA: Diagnosis not present

## 2018-05-19 DIAGNOSIS — N2581 Secondary hyperparathyroidism of renal origin: Secondary | ICD-10-CM | POA: Diagnosis not present

## 2018-05-21 DIAGNOSIS — N186 End stage renal disease: Secondary | ICD-10-CM | POA: Diagnosis not present

## 2018-05-21 DIAGNOSIS — N2581 Secondary hyperparathyroidism of renal origin: Secondary | ICD-10-CM | POA: Diagnosis not present

## 2018-05-21 DIAGNOSIS — E1129 Type 2 diabetes mellitus with other diabetic kidney complication: Secondary | ICD-10-CM | POA: Diagnosis not present

## 2018-05-21 DIAGNOSIS — D631 Anemia in chronic kidney disease: Secondary | ICD-10-CM | POA: Diagnosis not present

## 2018-05-21 DIAGNOSIS — Z992 Dependence on renal dialysis: Secondary | ICD-10-CM | POA: Diagnosis not present

## 2018-05-23 DIAGNOSIS — N186 End stage renal disease: Secondary | ICD-10-CM | POA: Diagnosis not present

## 2018-05-23 DIAGNOSIS — D631 Anemia in chronic kidney disease: Secondary | ICD-10-CM | POA: Diagnosis not present

## 2018-05-23 DIAGNOSIS — E1129 Type 2 diabetes mellitus with other diabetic kidney complication: Secondary | ICD-10-CM | POA: Diagnosis not present

## 2018-05-23 DIAGNOSIS — Z992 Dependence on renal dialysis: Secondary | ICD-10-CM | POA: Diagnosis not present

## 2018-05-23 DIAGNOSIS — N2581 Secondary hyperparathyroidism of renal origin: Secondary | ICD-10-CM | POA: Diagnosis not present

## 2018-05-26 DIAGNOSIS — Z992 Dependence on renal dialysis: Secondary | ICD-10-CM | POA: Diagnosis not present

## 2018-05-26 DIAGNOSIS — E1129 Type 2 diabetes mellitus with other diabetic kidney complication: Secondary | ICD-10-CM | POA: Diagnosis not present

## 2018-05-26 DIAGNOSIS — N2581 Secondary hyperparathyroidism of renal origin: Secondary | ICD-10-CM | POA: Diagnosis not present

## 2018-05-26 DIAGNOSIS — N186 End stage renal disease: Secondary | ICD-10-CM | POA: Diagnosis not present

## 2018-05-26 DIAGNOSIS — D631 Anemia in chronic kidney disease: Secondary | ICD-10-CM | POA: Diagnosis not present

## 2018-05-26 NOTE — Progress Notes (Deleted)
Subjective:   [name] is a 84 y.o. female who presents for an Initial Medicare Annual Wellness Visit.  Reports health as Dialysis shunt ESRD a1c 2013 5.9;  PVD with popliteal bypass  Diet  Exercise  Quit smoking and tobacco in the 90's   Health Maintenance Due  Topic Date Due  . FOOT EXAM  05/20/1944  . OPHTHALMOLOGY EXAM  05/20/1946  . URINE MICROALBUMIN  05/20/1946  . DEXA SCAN  05/20/2001  . HEMOGLOBIN A1C  05/25/2013  . PNA vac Low Risk Adult (2 of 2 - PPSV23) 02/19/2018   Mammogram 05/2015 Dexa was not done per our records         Objective:    There were no vitals filed for this visit. There is no height or weight on file to calculate BMI.  Advanced Directives 04/10/2017 04/10/2017 09/24/2016 09/08/2015 05/10/2015 12/17/2014 04/16/2014  Does Patient Have a Medical Advance Directive? No No No No No No Patient would not like information  Would patient like information on creating a medical advance directive? No - Patient declined - - - - - -  Pre-existing out of facility DNR order (yellow form or pink MOST form) - - - - - - -    Current Medications (verified) Outpatient Encounter Medications as of 05/27/2018  Medication Sig  . acetaminophen (TYLENOL) 500 MG tablet Take 500 mg by mouth every 6 (six) hours as needed for moderate pain.  Marland Kitchen amLODipine (NORVASC) 2.5 MG tablet TAKE 1 TABLET (2.5 MG TOTAL) BY MOUTH DAILY.  Marland Kitchen aspirin 81 MG chewable tablet Chew 81 mg by mouth daily.  . cloNIDine (CATAPRES) 0.1 MG tablet Take 1 tablet (0.1 mg total) by mouth daily. APPOINTMENT IS NEEDED FOR FURTHER REFILLS.  Marland Kitchen latanoprost (XALATAN) 0.005 % ophthalmic solution 1 drop at bedtime.  . ondansetron (ZOFRAN) 4 MG tablet Take 1 tablet (4 mg total) by mouth every 6 (six) hours as needed for nausea.  . pantoprazole (PROTONIX) 40 MG tablet Take 40 mg by mouth at bedtime as needed (occasional).   . sevelamer carbonate (RENVELA) 800 MG tablet Take 800 mg by mouth as directed.   No  facility-administered encounter medications on file as of 05/27/2018.     Allergies (verified) Ace inhibitors   History: Past Medical History:  Diagnosis Date  . Arthritis    "all over" (03/09/2014)  . Asthma    "used to; not anymore" (03/09/2014)  . Blood transfusion 1960's  . Chronic anemia   . Chronic back pain   . ESRD (end stage renal disease) on dialysis ([place])    "[address]  (04/10/2017)  . Gangrene ([place])    left fifth toe  . GERD (gastroesophageal reflux disease)    hx (03/09/2014)  . Gout, unspecified 1980's   "not anymore" (03/09/2014)  . Hyperlipidemia   . Hypertension   . Iliopsoas muscle hematoma 04/10/2017  . Mild pulmonary hypertension ([place])   . PAD (peripheral artery disease) ([place])    a. amputation of L toe 2013, with left external iliac artery to tibioperoneal trunk bypass graft with endarterectomy if the tibioperoneal trunk and anterior tibial artery origin in October 2013.  Marland Kitchen Pneumonia    "once; years ago" (03/09/2014)  . Shoulder fracture, right 2012  . Type II diabetes mellitus ([place])    "not on any medication at this time" (03/09/2014)   Past Surgical History:  Procedure Laterality Date  . APPENDECTOMY  1960's  . ARTERIOVENOUS GRAFT PLACEMENT Left  femoral loop arteriovenous Gore-Tex graft.  . AV FISTULA PLACEMENT  2008- 2013   "left upper arm; twice in my neck; left leg; removed from left leg; right upper arm" (11/25/2012)  . AV FISTULA PLACEMENT Right 03/09/2014   Procedure: RIGHT AXILLARY EXPLORATION; PARTIAL REMOVOAL OF OLD ARTERIOVENOUS (AV) GORE-TEX GRAFT; LIGATION OF RIGHT AXILLARY VEIN; ULTRASOUND GUIDED;  Surgeon: Conrad [place], MD;  Location: [facility];  Service: Vascular;  Laterality: Right;  . AV FISTULA REPAIR Bilateral    "right/left arm fistula failed; removed left thigh d/t poor circulation" (11/25/2012)  . CATARACT EXTRACTION, BILATERAL Bilateral   . COLONOSCOPY  2014?  . ESOPHAGOGASTRODUODENOSCOPY N/A 09/10/2013   Procedure:  ESOPHAGOGASTRODUODENOSCOPY (EGD);  Surgeon: [name], MD;  [name];  Service: Endoscopy;  Laterality: N/A;  . EXCHANGE OF A DIALYSIS CATHETER Right 06/11/2013   Procedure: EXCHANGE OF A DIALYSIS CATHETER;  Surgeon: Conrad [place], MD;  Location: [facility];  Service: Vascular;  Laterality: Right;  . EYE SURGERY    . FEMORAL-POPLITEAL BYPASS GRAFT  04/11/2012   Procedure: BYPASS GRAFT FEMORAL-POPLITEAL ARTERY;  Surgeon: [name], MD;  Location: [facility];  Service: Vascular;  Laterality: Left;  Thrombectomy/Left femoral-popliteal bypass with revision of proximal end and shortening of graft; intraoperative arteriogram; endarterectomy and patch angioplasty with distal anastomosis  . FEMORAL-POPLITEAL BYPASS GRAFT  10/09/2012   Procedure: BYPASS GRAFT FEMORAL-POPLITEAL ARTERY;  Surgeon: [name], MD;  Location: [facility];  Service: Vascular;  Laterality: Left;  Redo left tibioperoneal trunk bypass with Gortex Graft 54mmx80cm.  Marland Kitchen FISTULOGRAM Right 06/11/2013   Procedure: Venogram with angioplasty;  Surgeon: Conrad [place], MD;  Location: [facility];  Service: Vascular;  Laterality: Right;  RIGHT CENTRAL VENOGRAM WITH ANGIOPLASTY  . FOOT AMPUTATION THROUGH METATARSAL Left 06/22/11   "whole 5th toe" (11/25/2012)  . INSERTION OF DIALYSIS CATHETER Right 03/10/2014   Procedure: INSERTION OF TUNNELED  DIALYSIS CATHETER -attempted  RIGHT SUBCLAVIAN, RIGHT FEMORAL TUNNELED DIALYSIS CATHETER EXCHANGE with Inferior Vena Cava gram and Fibrin Sheath Angioplasty;  Surgeon: Conrad , MD;  Location: [facility];  Service: Vascular;  Laterality: Right;  . KYPHOPLASTY  11/25/2012   Procedure: KYPHOPLASTY;  Surgeon: Kristeen Miss, MD;  Location: [facility];  Service: Neurosurgery;  Laterality: N/A;  Lumbar two lumbar five Kyphoplasty  . PR VEIN BYPASS GRAFT,AORTO-FEM-POP  06/14/2011  . TOTAL ABDOMINAL HYSTERECTOMY  1960's   one ovary removed   Family History  Problem Relation Age of Onset  . Peripheral  vascular disease Mother        amputation  . Hypertension Mother   . Alcohol abuse Mother   . Arthritis Mother   . COPD Sister   . Heart attack Sister    Social History   Socioeconomic History  . Marital status: Widowed    Spouse name: Not on file  . Number of children: 2  . Years of education: Not on file  . Highest education level: Not on file  Occupational History  . Not on file  Social Needs  . Financial resource strain: Not on file  . Food insecurity:    Worry: Not on file    Inability: Not on file  . Transportation needs:    Medical: Not on file    Non-medical: Not on file  Tobacco Use  . Smoking status: Former Smoker    Packs/day: 0.12    Years: 15.00    Pack years: 1.80    Types: Cigarettes  . Smokeless  tobacco: Never Used  . Tobacco comment: 03/09/2014 "quit smoking cigarettes in the 1990's"  Substance and Sexual Activity  . Alcohol use: No    Comment: hx of abuse stopped 1990's  . Drug use: No    Types: Marijuana    Comment: 03/09/2014 "tried marijuana in the 1990's; couldn't handle it"  . Sexual activity: Yes  Lifestyle  . Physical activity:    Days per week: Not on file    Minutes per session: Not on file  . Stress: Not on file  Relationships  . Social connections:    Talks on phone: Not on file    Gets together: Not on file    Attends religious service: Not on file    Active member of club or organization: Not on file    Attends meetings of clubs or organizations: Not on file    Relationship status: Not on file  Other Topics Concern  . Not on file  Social History Narrative   Married. 2 children. Oldest died in April 05, 1999.    Tobacco Counseling Counseling given: Not Answered Comment: 03/09/2014 "quit smoking cigarettes in the 1990's"   Clinical Intake:       Activities of Daily Living No flowsheet data found.   Immunizations and Health Maintenance Immunization History  Administered Date(s) Administered  . Influenza-Unspecified  08/31/2013, 09/11/2017  . Pneumococcal Conjugate-13 02/19/2017  . Tdap 09/24/2016   Health Maintenance Due  Topic Date Due  . FOOT EXAM  05/20/1944  . OPHTHALMOLOGY EXAM  05/20/1946  . URINE MICROALBUMIN  05/20/1946  . DEXA SCAN  05/20/2001  . HEMOGLOBIN A1C  05/25/2013  . PNA vac Low Risk Adult (2 of 2 - PPSV23) 02/19/2018    Patient Care Team: Dorothyann Peng, NP as PCP - General (Family Medicine) Donato Heinz, MD (Nephrology)  Indicate any recent Medical Services you may have received from other than [facility] providers in the past year (date may be approximate).     Assessment:   This is a routine wellness examination for [name].  Hearing/Vision screen No exam data present  Dietary issues and exercise activities discussed:    Goals    None     Depression Screen No flowsheet data found.  Fall Risk No flowsheet data found.   Cognitive Function:   Ad8 score reviewed for issues:  Issues making decisions:  Less interest in hobbies / activities:  Repeats questions, stories (family complaining):  Trouble using ordinary gadgets (microwave, computer, phone):  Forgets the month or year:   Mismanaging finances:   Remembering appts:  Daily problems with thinking and/or memory: Ad8 score is=          Screening Tests Health Maintenance  Topic Date Due  . FOOT EXAM  05/20/1944  . OPHTHALMOLOGY EXAM  05/20/1946  . URINE MICROALBUMIN  05/20/1946  . DEXA SCAN  05/20/2001  . HEMOGLOBIN A1C  05/25/2013  . PNA vac Low Risk Adult (2 of 2 - PPSV23) 02/19/2018  . INFLUENZA VACCINE  07/31/2018  . TETANUS/TDAP  09/24/2026        Plan:      PCP Notes ***  Health Maintenance ***  Abnormal Screens  ***  Referrals  ***  Patient concerns; ***  Nurse Concerns; ***  Next PCP apt ***      I have personally reviewed and noted the following in the patient's chart:   . Medical and social history . Use of alcohol, tobacco or illicit  drugs  . Current medications and supplements .  Functional ability and status . Nutritional status . Physical activity . Advanced directives . List of other physicians . Hospitalizations, surgeries, and ER visits in previous 12 months . Vitals . Screenings to include cognitive, depression, and falls . Referrals and appointments  In addition, I have reviewed and discussed with patient certain preventive protocols, quality metrics, and best practice recommendations. A written personalized care plan for preventive services as well as general preventive health recommendations were provided to patient.     Wynetta Fines, RN   05/26/2018

## 2018-05-27 ENCOUNTER — Ambulatory Visit: Payer: Medicare Other | Admitting: Adult Health

## 2018-05-27 ENCOUNTER — Ambulatory Visit: Payer: Medicare Other

## 2018-05-27 DIAGNOSIS — Z0289 Encounter for other administrative examinations: Secondary | ICD-10-CM

## 2018-05-27 NOTE — Progress Notes (Deleted)
   Subjective:    Patient ID: [name], female    DOB: [birth year], 84 y.o.   MRN: [id number]  HPI Patient presents for yearly follow up exam. She is a pleasant 84 year old female who  has a past medical history of Arthritis, Asthma, Blood transfusion (1960's), Chronic anemia, Chronic back pain, ESRD (end stage renal disease) on dialysis ([place]), Gangrene ([place]), GERD (gastroesophageal reflux disease), Gout, unspecified (1980's), Hyperlipidemia, Hypertension, Iliopsoas muscle hematoma (04/10/2017), Mild pulmonary hypertension ([place]), PAD (peripheral artery disease) ([place]), Pneumonia, Shoulder fracture, right (2012), and Type II diabetes mellitus ([place]).  ESRD - She is on dialysis on M/W/F  - stable   Diabetes Mellitus - Diet controlled.  Lab Results  Component Value Date   HGBA1C 5.9 (H) 11/25/2012    Essential Hypertension - she takes clonodine 0.1 mg, norvasc 2.5 mg - monitored by cardiology.    All immunizations and health maintenance protocols were reviewed with the patient and needed orders were placed.  Appropriate screening laboratory values were ordered for the patient including screening of hyperlipidemia, renal function and hepatic function.  Medication reconciliation,  past medical history, social history, problem list and allergies were reviewed in detail with the patient  Goals were established with regard to weight loss, exercise, and  diet in compliance with medications  End of life planning was discussed.   Review of Systems     Objective:   Physical Exam  Constitutional: She is oriented to person, place, and time. She appears well-developed and well-nourished. No distress.  HENT:  Head: Normocephalic and atraumatic.  Right Ear: External ear normal.  Left Ear: External ear normal.  Nose: Nose normal.  Mouth/Throat: Oropharynx is clear and moist. No oropharyngeal exudate.  Eyes: Pupils are equal, round, and reactive to light. Conjunctivae and EOM are normal.  Right eye exhibits no discharge. Left eye exhibits no discharge. No scleral icterus.  Neck: Normal range of motion. Neck supple. No JVD present. No tracheal deviation present. No thyromegaly present.  Cardiovascular: Normal rate, regular rhythm, normal heart sounds and intact distal pulses. Exam reveals no gallop and no friction rub.  No murmur heard. Dialysis fistula in place  Pulmonary/Chest: Effort normal and breath sounds normal. No stridor. No respiratory distress. She has no wheezes. She has no rales. She exhibits no tenderness.  Abdominal: Soft. Bowel sounds are normal. She exhibits no distension and no mass. There is no tenderness. There is no rebound and no guarding. No hernia.  Genitourinary: Vagina normal and uterus normal. Rectal exam shows guaiac negative stool. No vaginal discharge found.  Musculoskeletal: Normal range of motion. She exhibits no edema, tenderness or deformity.  Lymphadenopathy:    She has no cervical adenopathy.  Neurological: She is alert and oriented to person, place, and time. She displays normal reflexes. No cranial nerve deficit or sensory deficit. She exhibits normal muscle tone. Coordination normal.  Skin: Skin is warm and dry. Capillary refill takes less than 2 seconds. No rash noted. She is not diaphoretic. No erythema. No pallor.  Psychiatric: She has a normal mood and affect. Her behavior is normal. Judgment and thought content normal.  Nursing note and vitals reviewed.     Assessment & Plan:

## 2018-05-28 DIAGNOSIS — Z992 Dependence on renal dialysis: Secondary | ICD-10-CM | POA: Diagnosis not present

## 2018-05-28 DIAGNOSIS — N186 End stage renal disease: Secondary | ICD-10-CM | POA: Diagnosis not present

## 2018-05-28 DIAGNOSIS — N2581 Secondary hyperparathyroidism of renal origin: Secondary | ICD-10-CM | POA: Diagnosis not present

## 2018-05-28 DIAGNOSIS — E1129 Type 2 diabetes mellitus with other diabetic kidney complication: Secondary | ICD-10-CM | POA: Diagnosis not present

## 2018-05-28 DIAGNOSIS — D631 Anemia in chronic kidney disease: Secondary | ICD-10-CM | POA: Diagnosis not present

## 2018-05-30 DIAGNOSIS — N2581 Secondary hyperparathyroidism of renal origin: Secondary | ICD-10-CM | POA: Diagnosis not present

## 2018-05-30 DIAGNOSIS — D631 Anemia in chronic kidney disease: Secondary | ICD-10-CM | POA: Diagnosis not present

## 2018-05-30 DIAGNOSIS — Z992 Dependence on renal dialysis: Secondary | ICD-10-CM | POA: Diagnosis not present

## 2018-05-30 DIAGNOSIS — N186 End stage renal disease: Secondary | ICD-10-CM | POA: Diagnosis not present

## 2018-05-30 DIAGNOSIS — E1129 Type 2 diabetes mellitus with other diabetic kidney complication: Secondary | ICD-10-CM | POA: Diagnosis not present

## 2018-05-31 DIAGNOSIS — N186 End stage renal disease: Secondary | ICD-10-CM | POA: Diagnosis not present

## 2018-05-31 DIAGNOSIS — I120 Hypertensive chronic kidney disease with stage 5 chronic kidney disease or end stage renal disease: Secondary | ICD-10-CM | POA: Diagnosis not present

## 2018-05-31 DIAGNOSIS — Z992 Dependence on renal dialysis: Secondary | ICD-10-CM | POA: Diagnosis not present

## 2018-06-02 DIAGNOSIS — E1129 Type 2 diabetes mellitus with other diabetic kidney complication: Secondary | ICD-10-CM | POA: Diagnosis not present

## 2018-06-02 DIAGNOSIS — D631 Anemia in chronic kidney disease: Secondary | ICD-10-CM | POA: Diagnosis not present

## 2018-06-02 DIAGNOSIS — N2581 Secondary hyperparathyroidism of renal origin: Secondary | ICD-10-CM | POA: Diagnosis not present

## 2018-06-02 DIAGNOSIS — Z992 Dependence on renal dialysis: Secondary | ICD-10-CM | POA: Diagnosis not present

## 2018-06-02 DIAGNOSIS — N186 End stage renal disease: Secondary | ICD-10-CM | POA: Diagnosis not present

## 2018-06-04 DIAGNOSIS — E1129 Type 2 diabetes mellitus with other diabetic kidney complication: Secondary | ICD-10-CM | POA: Diagnosis not present

## 2018-06-04 DIAGNOSIS — D631 Anemia in chronic kidney disease: Secondary | ICD-10-CM | POA: Diagnosis not present

## 2018-06-04 DIAGNOSIS — Z992 Dependence on renal dialysis: Secondary | ICD-10-CM | POA: Diagnosis not present

## 2018-06-04 DIAGNOSIS — N186 End stage renal disease: Secondary | ICD-10-CM | POA: Diagnosis not present

## 2018-06-04 DIAGNOSIS — N2581 Secondary hyperparathyroidism of renal origin: Secondary | ICD-10-CM | POA: Diagnosis not present

## 2018-06-06 DIAGNOSIS — N186 End stage renal disease: Secondary | ICD-10-CM | POA: Diagnosis not present

## 2018-06-06 DIAGNOSIS — Z992 Dependence on renal dialysis: Secondary | ICD-10-CM | POA: Diagnosis not present

## 2018-06-06 DIAGNOSIS — N2581 Secondary hyperparathyroidism of renal origin: Secondary | ICD-10-CM | POA: Diagnosis not present

## 2018-06-06 DIAGNOSIS — E1129 Type 2 diabetes mellitus with other diabetic kidney complication: Secondary | ICD-10-CM | POA: Diagnosis not present

## 2018-06-06 DIAGNOSIS — D631 Anemia in chronic kidney disease: Secondary | ICD-10-CM | POA: Diagnosis not present

## 2018-06-09 DIAGNOSIS — N186 End stage renal disease: Secondary | ICD-10-CM | POA: Diagnosis not present

## 2018-06-09 DIAGNOSIS — N2581 Secondary hyperparathyroidism of renal origin: Secondary | ICD-10-CM | POA: Diagnosis not present

## 2018-06-09 DIAGNOSIS — Z992 Dependence on renal dialysis: Secondary | ICD-10-CM | POA: Diagnosis not present

## 2018-06-09 DIAGNOSIS — D631 Anemia in chronic kidney disease: Secondary | ICD-10-CM | POA: Diagnosis not present

## 2018-06-09 DIAGNOSIS — E1129 Type 2 diabetes mellitus with other diabetic kidney complication: Secondary | ICD-10-CM | POA: Diagnosis not present

## 2018-06-11 DIAGNOSIS — N186 End stage renal disease: Secondary | ICD-10-CM | POA: Diagnosis not present

## 2018-06-11 DIAGNOSIS — Z992 Dependence on renal dialysis: Secondary | ICD-10-CM | POA: Diagnosis not present

## 2018-06-11 DIAGNOSIS — D631 Anemia in chronic kidney disease: Secondary | ICD-10-CM | POA: Diagnosis not present

## 2018-06-11 DIAGNOSIS — N2581 Secondary hyperparathyroidism of renal origin: Secondary | ICD-10-CM | POA: Diagnosis not present

## 2018-06-11 DIAGNOSIS — E1129 Type 2 diabetes mellitus with other diabetic kidney complication: Secondary | ICD-10-CM | POA: Diagnosis not present

## 2018-06-13 DIAGNOSIS — N186 End stage renal disease: Secondary | ICD-10-CM | POA: Diagnosis not present

## 2018-06-13 DIAGNOSIS — Z992 Dependence on renal dialysis: Secondary | ICD-10-CM | POA: Diagnosis not present

## 2018-06-13 DIAGNOSIS — E1129 Type 2 diabetes mellitus with other diabetic kidney complication: Secondary | ICD-10-CM | POA: Diagnosis not present

## 2018-06-13 DIAGNOSIS — N2581 Secondary hyperparathyroidism of renal origin: Secondary | ICD-10-CM | POA: Diagnosis not present

## 2018-06-13 DIAGNOSIS — D631 Anemia in chronic kidney disease: Secondary | ICD-10-CM | POA: Diagnosis not present

## 2018-06-16 DIAGNOSIS — Z992 Dependence on renal dialysis: Secondary | ICD-10-CM | POA: Diagnosis not present

## 2018-06-16 DIAGNOSIS — N2581 Secondary hyperparathyroidism of renal origin: Secondary | ICD-10-CM | POA: Diagnosis not present

## 2018-06-16 DIAGNOSIS — E1129 Type 2 diabetes mellitus with other diabetic kidney complication: Secondary | ICD-10-CM | POA: Diagnosis not present

## 2018-06-16 DIAGNOSIS — N186 End stage renal disease: Secondary | ICD-10-CM | POA: Diagnosis not present

## 2018-06-16 DIAGNOSIS — D631 Anemia in chronic kidney disease: Secondary | ICD-10-CM | POA: Diagnosis not present

## 2018-06-18 DIAGNOSIS — N186 End stage renal disease: Secondary | ICD-10-CM | POA: Diagnosis not present

## 2018-06-18 DIAGNOSIS — N2581 Secondary hyperparathyroidism of renal origin: Secondary | ICD-10-CM | POA: Diagnosis not present

## 2018-06-18 DIAGNOSIS — Z992 Dependence on renal dialysis: Secondary | ICD-10-CM | POA: Diagnosis not present

## 2018-06-18 DIAGNOSIS — D631 Anemia in chronic kidney disease: Secondary | ICD-10-CM | POA: Diagnosis not present

## 2018-06-18 DIAGNOSIS — E1129 Type 2 diabetes mellitus with other diabetic kidney complication: Secondary | ICD-10-CM | POA: Diagnosis not present

## 2018-06-20 DIAGNOSIS — Z992 Dependence on renal dialysis: Secondary | ICD-10-CM | POA: Diagnosis not present

## 2018-06-20 DIAGNOSIS — E1129 Type 2 diabetes mellitus with other diabetic kidney complication: Secondary | ICD-10-CM | POA: Diagnosis not present

## 2018-06-20 DIAGNOSIS — D631 Anemia in chronic kidney disease: Secondary | ICD-10-CM | POA: Diagnosis not present

## 2018-06-20 DIAGNOSIS — N2581 Secondary hyperparathyroidism of renal origin: Secondary | ICD-10-CM | POA: Diagnosis not present

## 2018-06-20 DIAGNOSIS — N186 End stage renal disease: Secondary | ICD-10-CM | POA: Diagnosis not present

## 2018-06-23 DIAGNOSIS — D631 Anemia in chronic kidney disease: Secondary | ICD-10-CM | POA: Diagnosis not present

## 2018-06-23 DIAGNOSIS — E1129 Type 2 diabetes mellitus with other diabetic kidney complication: Secondary | ICD-10-CM | POA: Diagnosis not present

## 2018-06-23 DIAGNOSIS — N2581 Secondary hyperparathyroidism of renal origin: Secondary | ICD-10-CM | POA: Diagnosis not present

## 2018-06-23 DIAGNOSIS — Z992 Dependence on renal dialysis: Secondary | ICD-10-CM | POA: Diagnosis not present

## 2018-06-23 DIAGNOSIS — N186 End stage renal disease: Secondary | ICD-10-CM | POA: Diagnosis not present

## 2018-06-25 DIAGNOSIS — N2581 Secondary hyperparathyroidism of renal origin: Secondary | ICD-10-CM | POA: Diagnosis not present

## 2018-06-25 DIAGNOSIS — N186 End stage renal disease: Secondary | ICD-10-CM | POA: Diagnosis not present

## 2018-06-25 DIAGNOSIS — D631 Anemia in chronic kidney disease: Secondary | ICD-10-CM | POA: Diagnosis not present

## 2018-06-25 DIAGNOSIS — Z992 Dependence on renal dialysis: Secondary | ICD-10-CM | POA: Diagnosis not present

## 2018-06-25 DIAGNOSIS — E1129 Type 2 diabetes mellitus with other diabetic kidney complication: Secondary | ICD-10-CM | POA: Diagnosis not present

## 2018-06-27 DIAGNOSIS — Z992 Dependence on renal dialysis: Secondary | ICD-10-CM | POA: Diagnosis not present

## 2018-06-27 DIAGNOSIS — N186 End stage renal disease: Secondary | ICD-10-CM | POA: Diagnosis not present

## 2018-06-27 DIAGNOSIS — D631 Anemia in chronic kidney disease: Secondary | ICD-10-CM | POA: Diagnosis not present

## 2018-06-27 DIAGNOSIS — E1129 Type 2 diabetes mellitus with other diabetic kidney complication: Secondary | ICD-10-CM | POA: Diagnosis not present

## 2018-06-27 DIAGNOSIS — N2581 Secondary hyperparathyroidism of renal origin: Secondary | ICD-10-CM | POA: Diagnosis not present

## 2018-06-30 DIAGNOSIS — I120 Hypertensive chronic kidney disease with stage 5 chronic kidney disease or end stage renal disease: Secondary | ICD-10-CM | POA: Diagnosis not present

## 2018-06-30 DIAGNOSIS — D631 Anemia in chronic kidney disease: Secondary | ICD-10-CM | POA: Diagnosis not present

## 2018-06-30 DIAGNOSIS — E1129 Type 2 diabetes mellitus with other diabetic kidney complication: Secondary | ICD-10-CM | POA: Diagnosis not present

## 2018-06-30 DIAGNOSIS — N2581 Secondary hyperparathyroidism of renal origin: Secondary | ICD-10-CM | POA: Diagnosis not present

## 2018-06-30 DIAGNOSIS — Z992 Dependence on renal dialysis: Secondary | ICD-10-CM | POA: Diagnosis not present

## 2018-06-30 DIAGNOSIS — N186 End stage renal disease: Secondary | ICD-10-CM | POA: Diagnosis not present

## 2018-06-30 DIAGNOSIS — E119 Type 2 diabetes mellitus without complications: Secondary | ICD-10-CM | POA: Diagnosis not present

## 2018-07-02 DIAGNOSIS — E1129 Type 2 diabetes mellitus with other diabetic kidney complication: Secondary | ICD-10-CM | POA: Diagnosis not present

## 2018-07-02 DIAGNOSIS — N186 End stage renal disease: Secondary | ICD-10-CM | POA: Diagnosis not present

## 2018-07-02 DIAGNOSIS — D631 Anemia in chronic kidney disease: Secondary | ICD-10-CM | POA: Diagnosis not present

## 2018-07-02 DIAGNOSIS — Z992 Dependence on renal dialysis: Secondary | ICD-10-CM | POA: Diagnosis not present

## 2018-07-02 DIAGNOSIS — N2581 Secondary hyperparathyroidism of renal origin: Secondary | ICD-10-CM | POA: Diagnosis not present

## 2018-07-02 DIAGNOSIS — E119 Type 2 diabetes mellitus without complications: Secondary | ICD-10-CM | POA: Diagnosis not present

## 2018-07-04 DIAGNOSIS — D631 Anemia in chronic kidney disease: Secondary | ICD-10-CM | POA: Diagnosis not present

## 2018-07-04 DIAGNOSIS — N186 End stage renal disease: Secondary | ICD-10-CM | POA: Diagnosis not present

## 2018-07-04 DIAGNOSIS — Z992 Dependence on renal dialysis: Secondary | ICD-10-CM | POA: Diagnosis not present

## 2018-07-04 DIAGNOSIS — N2581 Secondary hyperparathyroidism of renal origin: Secondary | ICD-10-CM | POA: Diagnosis not present

## 2018-07-04 DIAGNOSIS — E119 Type 2 diabetes mellitus without complications: Secondary | ICD-10-CM | POA: Diagnosis not present

## 2018-07-04 DIAGNOSIS — E1129 Type 2 diabetes mellitus with other diabetic kidney complication: Secondary | ICD-10-CM | POA: Diagnosis not present

## 2018-07-07 DIAGNOSIS — E1129 Type 2 diabetes mellitus with other diabetic kidney complication: Secondary | ICD-10-CM | POA: Diagnosis not present

## 2018-07-07 DIAGNOSIS — N186 End stage renal disease: Secondary | ICD-10-CM | POA: Diagnosis not present

## 2018-07-07 DIAGNOSIS — E119 Type 2 diabetes mellitus without complications: Secondary | ICD-10-CM | POA: Diagnosis not present

## 2018-07-07 DIAGNOSIS — Z992 Dependence on renal dialysis: Secondary | ICD-10-CM | POA: Diagnosis not present

## 2018-07-07 DIAGNOSIS — N2581 Secondary hyperparathyroidism of renal origin: Secondary | ICD-10-CM | POA: Diagnosis not present

## 2018-07-07 DIAGNOSIS — D631 Anemia in chronic kidney disease: Secondary | ICD-10-CM | POA: Diagnosis not present

## 2018-07-09 DIAGNOSIS — E119 Type 2 diabetes mellitus without complications: Secondary | ICD-10-CM | POA: Diagnosis not present

## 2018-07-09 DIAGNOSIS — Z992 Dependence on renal dialysis: Secondary | ICD-10-CM | POA: Diagnosis not present

## 2018-07-09 DIAGNOSIS — E1129 Type 2 diabetes mellitus with other diabetic kidney complication: Secondary | ICD-10-CM | POA: Diagnosis not present

## 2018-07-09 DIAGNOSIS — D631 Anemia in chronic kidney disease: Secondary | ICD-10-CM | POA: Diagnosis not present

## 2018-07-09 DIAGNOSIS — N186 End stage renal disease: Secondary | ICD-10-CM | POA: Diagnosis not present

## 2018-07-09 DIAGNOSIS — N2581 Secondary hyperparathyroidism of renal origin: Secondary | ICD-10-CM | POA: Diagnosis not present

## 2018-07-11 DIAGNOSIS — E1129 Type 2 diabetes mellitus with other diabetic kidney complication: Secondary | ICD-10-CM | POA: Diagnosis not present

## 2018-07-11 DIAGNOSIS — N186 End stage renal disease: Secondary | ICD-10-CM | POA: Diagnosis not present

## 2018-07-11 DIAGNOSIS — N2581 Secondary hyperparathyroidism of renal origin: Secondary | ICD-10-CM | POA: Diagnosis not present

## 2018-07-11 DIAGNOSIS — Z992 Dependence on renal dialysis: Secondary | ICD-10-CM | POA: Diagnosis not present

## 2018-07-11 DIAGNOSIS — D631 Anemia in chronic kidney disease: Secondary | ICD-10-CM | POA: Diagnosis not present

## 2018-07-11 DIAGNOSIS — E119 Type 2 diabetes mellitus without complications: Secondary | ICD-10-CM | POA: Diagnosis not present

## 2018-07-14 DIAGNOSIS — E1129 Type 2 diabetes mellitus with other diabetic kidney complication: Secondary | ICD-10-CM | POA: Diagnosis not present

## 2018-07-14 DIAGNOSIS — Z992 Dependence on renal dialysis: Secondary | ICD-10-CM | POA: Diagnosis not present

## 2018-07-14 DIAGNOSIS — N186 End stage renal disease: Secondary | ICD-10-CM | POA: Diagnosis not present

## 2018-07-14 DIAGNOSIS — E119 Type 2 diabetes mellitus without complications: Secondary | ICD-10-CM | POA: Diagnosis not present

## 2018-07-14 DIAGNOSIS — D631 Anemia in chronic kidney disease: Secondary | ICD-10-CM | POA: Diagnosis not present

## 2018-07-14 DIAGNOSIS — N2581 Secondary hyperparathyroidism of renal origin: Secondary | ICD-10-CM | POA: Diagnosis not present

## 2018-07-16 DIAGNOSIS — Z992 Dependence on renal dialysis: Secondary | ICD-10-CM | POA: Diagnosis not present

## 2018-07-16 DIAGNOSIS — E1129 Type 2 diabetes mellitus with other diabetic kidney complication: Secondary | ICD-10-CM | POA: Diagnosis not present

## 2018-07-16 DIAGNOSIS — E119 Type 2 diabetes mellitus without complications: Secondary | ICD-10-CM | POA: Diagnosis not present

## 2018-07-16 DIAGNOSIS — N186 End stage renal disease: Secondary | ICD-10-CM | POA: Diagnosis not present

## 2018-07-16 DIAGNOSIS — N2581 Secondary hyperparathyroidism of renal origin: Secondary | ICD-10-CM | POA: Diagnosis not present

## 2018-07-16 DIAGNOSIS — D631 Anemia in chronic kidney disease: Secondary | ICD-10-CM | POA: Diagnosis not present

## 2018-07-18 DIAGNOSIS — N186 End stage renal disease: Secondary | ICD-10-CM | POA: Diagnosis not present

## 2018-07-18 DIAGNOSIS — E1129 Type 2 diabetes mellitus with other diabetic kidney complication: Secondary | ICD-10-CM | POA: Diagnosis not present

## 2018-07-18 DIAGNOSIS — Z992 Dependence on renal dialysis: Secondary | ICD-10-CM | POA: Diagnosis not present

## 2018-07-18 DIAGNOSIS — E119 Type 2 diabetes mellitus without complications: Secondary | ICD-10-CM | POA: Diagnosis not present

## 2018-07-18 DIAGNOSIS — D631 Anemia in chronic kidney disease: Secondary | ICD-10-CM | POA: Diagnosis not present

## 2018-07-18 DIAGNOSIS — N2581 Secondary hyperparathyroidism of renal origin: Secondary | ICD-10-CM | POA: Diagnosis not present

## 2018-07-21 DIAGNOSIS — E119 Type 2 diabetes mellitus without complications: Secondary | ICD-10-CM | POA: Diagnosis not present

## 2018-07-21 DIAGNOSIS — D631 Anemia in chronic kidney disease: Secondary | ICD-10-CM | POA: Diagnosis not present

## 2018-07-21 DIAGNOSIS — N186 End stage renal disease: Secondary | ICD-10-CM | POA: Diagnosis not present

## 2018-07-21 DIAGNOSIS — E1129 Type 2 diabetes mellitus with other diabetic kidney complication: Secondary | ICD-10-CM | POA: Diagnosis not present

## 2018-07-21 DIAGNOSIS — Z992 Dependence on renal dialysis: Secondary | ICD-10-CM | POA: Diagnosis not present

## 2018-07-21 DIAGNOSIS — N2581 Secondary hyperparathyroidism of renal origin: Secondary | ICD-10-CM | POA: Diagnosis not present

## 2018-07-23 DIAGNOSIS — D631 Anemia in chronic kidney disease: Secondary | ICD-10-CM | POA: Diagnosis not present

## 2018-07-23 DIAGNOSIS — Z992 Dependence on renal dialysis: Secondary | ICD-10-CM | POA: Diagnosis not present

## 2018-07-23 DIAGNOSIS — E1129 Type 2 diabetes mellitus with other diabetic kidney complication: Secondary | ICD-10-CM | POA: Diagnosis not present

## 2018-07-23 DIAGNOSIS — N2581 Secondary hyperparathyroidism of renal origin: Secondary | ICD-10-CM | POA: Diagnosis not present

## 2018-07-23 DIAGNOSIS — N186 End stage renal disease: Secondary | ICD-10-CM | POA: Diagnosis not present

## 2018-07-23 DIAGNOSIS — E119 Type 2 diabetes mellitus without complications: Secondary | ICD-10-CM | POA: Diagnosis not present

## 2018-07-25 DIAGNOSIS — D631 Anemia in chronic kidney disease: Secondary | ICD-10-CM | POA: Diagnosis not present

## 2018-07-25 DIAGNOSIS — N2581 Secondary hyperparathyroidism of renal origin: Secondary | ICD-10-CM | POA: Diagnosis not present

## 2018-07-25 DIAGNOSIS — E119 Type 2 diabetes mellitus without complications: Secondary | ICD-10-CM | POA: Diagnosis not present

## 2018-07-25 DIAGNOSIS — N186 End stage renal disease: Secondary | ICD-10-CM | POA: Diagnosis not present

## 2018-07-25 DIAGNOSIS — E1129 Type 2 diabetes mellitus with other diabetic kidney complication: Secondary | ICD-10-CM | POA: Diagnosis not present

## 2018-07-25 DIAGNOSIS — Z992 Dependence on renal dialysis: Secondary | ICD-10-CM | POA: Diagnosis not present

## 2018-07-28 DIAGNOSIS — E1129 Type 2 diabetes mellitus with other diabetic kidney complication: Secondary | ICD-10-CM | POA: Diagnosis not present

## 2018-07-28 DIAGNOSIS — N2581 Secondary hyperparathyroidism of renal origin: Secondary | ICD-10-CM | POA: Diagnosis not present

## 2018-07-28 DIAGNOSIS — N186 End stage renal disease: Secondary | ICD-10-CM | POA: Diagnosis not present

## 2018-07-28 DIAGNOSIS — E119 Type 2 diabetes mellitus without complications: Secondary | ICD-10-CM | POA: Diagnosis not present

## 2018-07-28 DIAGNOSIS — D631 Anemia in chronic kidney disease: Secondary | ICD-10-CM | POA: Diagnosis not present

## 2018-07-28 DIAGNOSIS — Z992 Dependence on renal dialysis: Secondary | ICD-10-CM | POA: Diagnosis not present

## 2018-07-30 DIAGNOSIS — D631 Anemia in chronic kidney disease: Secondary | ICD-10-CM | POA: Diagnosis not present

## 2018-07-30 DIAGNOSIS — Z992 Dependence on renal dialysis: Secondary | ICD-10-CM | POA: Diagnosis not present

## 2018-07-30 DIAGNOSIS — E119 Type 2 diabetes mellitus without complications: Secondary | ICD-10-CM | POA: Diagnosis not present

## 2018-07-30 DIAGNOSIS — E1129 Type 2 diabetes mellitus with other diabetic kidney complication: Secondary | ICD-10-CM | POA: Diagnosis not present

## 2018-07-30 DIAGNOSIS — N2581 Secondary hyperparathyroidism of renal origin: Secondary | ICD-10-CM | POA: Diagnosis not present

## 2018-07-30 DIAGNOSIS — N186 End stage renal disease: Secondary | ICD-10-CM | POA: Diagnosis not present

## 2018-07-31 DIAGNOSIS — I120 Hypertensive chronic kidney disease with stage 5 chronic kidney disease or end stage renal disease: Secondary | ICD-10-CM | POA: Diagnosis not present

## 2018-07-31 DIAGNOSIS — N186 End stage renal disease: Secondary | ICD-10-CM | POA: Diagnosis not present

## 2018-07-31 DIAGNOSIS — Z992 Dependence on renal dialysis: Secondary | ICD-10-CM | POA: Diagnosis not present

## 2018-08-01 DIAGNOSIS — Z992 Dependence on renal dialysis: Secondary | ICD-10-CM | POA: Diagnosis not present

## 2018-08-01 DIAGNOSIS — N186 End stage renal disease: Secondary | ICD-10-CM | POA: Diagnosis not present

## 2018-08-01 DIAGNOSIS — D631 Anemia in chronic kidney disease: Secondary | ICD-10-CM | POA: Diagnosis not present

## 2018-08-01 DIAGNOSIS — N2581 Secondary hyperparathyroidism of renal origin: Secondary | ICD-10-CM | POA: Diagnosis not present

## 2018-08-01 DIAGNOSIS — E1129 Type 2 diabetes mellitus with other diabetic kidney complication: Secondary | ICD-10-CM | POA: Diagnosis not present

## 2018-08-04 DIAGNOSIS — N2581 Secondary hyperparathyroidism of renal origin: Secondary | ICD-10-CM | POA: Diagnosis not present

## 2018-08-04 DIAGNOSIS — D631 Anemia in chronic kidney disease: Secondary | ICD-10-CM | POA: Diagnosis not present

## 2018-08-04 DIAGNOSIS — N186 End stage renal disease: Secondary | ICD-10-CM | POA: Diagnosis not present

## 2018-08-04 DIAGNOSIS — Z992 Dependence on renal dialysis: Secondary | ICD-10-CM | POA: Diagnosis not present

## 2018-08-04 DIAGNOSIS — E1129 Type 2 diabetes mellitus with other diabetic kidney complication: Secondary | ICD-10-CM | POA: Diagnosis not present

## 2018-08-06 DIAGNOSIS — E1129 Type 2 diabetes mellitus with other diabetic kidney complication: Secondary | ICD-10-CM | POA: Diagnosis not present

## 2018-08-06 DIAGNOSIS — D631 Anemia in chronic kidney disease: Secondary | ICD-10-CM | POA: Diagnosis not present

## 2018-08-06 DIAGNOSIS — N186 End stage renal disease: Secondary | ICD-10-CM | POA: Diagnosis not present

## 2018-08-06 DIAGNOSIS — N2581 Secondary hyperparathyroidism of renal origin: Secondary | ICD-10-CM | POA: Diagnosis not present

## 2018-08-06 DIAGNOSIS — Z992 Dependence on renal dialysis: Secondary | ICD-10-CM | POA: Diagnosis not present

## 2018-08-08 DIAGNOSIS — Z992 Dependence on renal dialysis: Secondary | ICD-10-CM | POA: Diagnosis not present

## 2018-08-08 DIAGNOSIS — N186 End stage renal disease: Secondary | ICD-10-CM | POA: Diagnosis not present

## 2018-08-08 DIAGNOSIS — N2581 Secondary hyperparathyroidism of renal origin: Secondary | ICD-10-CM | POA: Diagnosis not present

## 2018-08-08 DIAGNOSIS — D631 Anemia in chronic kidney disease: Secondary | ICD-10-CM | POA: Diagnosis not present

## 2018-08-08 DIAGNOSIS — E1129 Type 2 diabetes mellitus with other diabetic kidney complication: Secondary | ICD-10-CM | POA: Diagnosis not present

## 2018-08-11 DIAGNOSIS — E1129 Type 2 diabetes mellitus with other diabetic kidney complication: Secondary | ICD-10-CM | POA: Diagnosis not present

## 2018-08-11 DIAGNOSIS — N186 End stage renal disease: Secondary | ICD-10-CM | POA: Diagnosis not present

## 2018-08-11 DIAGNOSIS — N2581 Secondary hyperparathyroidism of renal origin: Secondary | ICD-10-CM | POA: Diagnosis not present

## 2018-08-11 DIAGNOSIS — D631 Anemia in chronic kidney disease: Secondary | ICD-10-CM | POA: Diagnosis not present

## 2018-08-11 DIAGNOSIS — Z992 Dependence on renal dialysis: Secondary | ICD-10-CM | POA: Diagnosis not present

## 2018-08-13 DIAGNOSIS — D631 Anemia in chronic kidney disease: Secondary | ICD-10-CM | POA: Diagnosis not present

## 2018-08-13 DIAGNOSIS — N2581 Secondary hyperparathyroidism of renal origin: Secondary | ICD-10-CM | POA: Diagnosis not present

## 2018-08-13 DIAGNOSIS — E1129 Type 2 diabetes mellitus with other diabetic kidney complication: Secondary | ICD-10-CM | POA: Diagnosis not present

## 2018-08-13 DIAGNOSIS — Z992 Dependence on renal dialysis: Secondary | ICD-10-CM | POA: Diagnosis not present

## 2018-08-13 DIAGNOSIS — N186 End stage renal disease: Secondary | ICD-10-CM | POA: Diagnosis not present

## 2018-08-15 DIAGNOSIS — N2581 Secondary hyperparathyroidism of renal origin: Secondary | ICD-10-CM | POA: Diagnosis not present

## 2018-08-15 DIAGNOSIS — Z992 Dependence on renal dialysis: Secondary | ICD-10-CM | POA: Diagnosis not present

## 2018-08-15 DIAGNOSIS — D631 Anemia in chronic kidney disease: Secondary | ICD-10-CM | POA: Diagnosis not present

## 2018-08-15 DIAGNOSIS — E1129 Type 2 diabetes mellitus with other diabetic kidney complication: Secondary | ICD-10-CM | POA: Diagnosis not present

## 2018-08-15 DIAGNOSIS — N186 End stage renal disease: Secondary | ICD-10-CM | POA: Diagnosis not present

## 2018-08-18 DIAGNOSIS — D631 Anemia in chronic kidney disease: Secondary | ICD-10-CM | POA: Diagnosis not present

## 2018-08-18 DIAGNOSIS — N186 End stage renal disease: Secondary | ICD-10-CM | POA: Diagnosis not present

## 2018-08-18 DIAGNOSIS — N2581 Secondary hyperparathyroidism of renal origin: Secondary | ICD-10-CM | POA: Diagnosis not present

## 2018-08-18 DIAGNOSIS — Z992 Dependence on renal dialysis: Secondary | ICD-10-CM | POA: Diagnosis not present

## 2018-08-18 DIAGNOSIS — E1129 Type 2 diabetes mellitus with other diabetic kidney complication: Secondary | ICD-10-CM | POA: Diagnosis not present

## 2018-08-20 DIAGNOSIS — N186 End stage renal disease: Secondary | ICD-10-CM | POA: Diagnosis not present

## 2018-08-20 DIAGNOSIS — D631 Anemia in chronic kidney disease: Secondary | ICD-10-CM | POA: Diagnosis not present

## 2018-08-20 DIAGNOSIS — E1129 Type 2 diabetes mellitus with other diabetic kidney complication: Secondary | ICD-10-CM | POA: Diagnosis not present

## 2018-08-20 DIAGNOSIS — N2581 Secondary hyperparathyroidism of renal origin: Secondary | ICD-10-CM | POA: Diagnosis not present

## 2018-08-20 DIAGNOSIS — Z992 Dependence on renal dialysis: Secondary | ICD-10-CM | POA: Diagnosis not present

## 2018-08-22 DIAGNOSIS — N2581 Secondary hyperparathyroidism of renal origin: Secondary | ICD-10-CM | POA: Diagnosis not present

## 2018-08-22 DIAGNOSIS — D631 Anemia in chronic kidney disease: Secondary | ICD-10-CM | POA: Diagnosis not present

## 2018-08-22 DIAGNOSIS — Z992 Dependence on renal dialysis: Secondary | ICD-10-CM | POA: Diagnosis not present

## 2018-08-22 DIAGNOSIS — N186 End stage renal disease: Secondary | ICD-10-CM | POA: Diagnosis not present

## 2018-08-22 DIAGNOSIS — E1129 Type 2 diabetes mellitus with other diabetic kidney complication: Secondary | ICD-10-CM | POA: Diagnosis not present

## 2018-08-25 DIAGNOSIS — Z992 Dependence on renal dialysis: Secondary | ICD-10-CM | POA: Diagnosis not present

## 2018-08-25 DIAGNOSIS — N186 End stage renal disease: Secondary | ICD-10-CM | POA: Diagnosis not present

## 2018-08-25 DIAGNOSIS — D631 Anemia in chronic kidney disease: Secondary | ICD-10-CM | POA: Diagnosis not present

## 2018-08-25 DIAGNOSIS — E1129 Type 2 diabetes mellitus with other diabetic kidney complication: Secondary | ICD-10-CM | POA: Diagnosis not present

## 2018-08-25 DIAGNOSIS — N2581 Secondary hyperparathyroidism of renal origin: Secondary | ICD-10-CM | POA: Diagnosis not present

## 2018-08-27 DIAGNOSIS — N2581 Secondary hyperparathyroidism of renal origin: Secondary | ICD-10-CM | POA: Diagnosis not present

## 2018-08-27 DIAGNOSIS — E1129 Type 2 diabetes mellitus with other diabetic kidney complication: Secondary | ICD-10-CM | POA: Diagnosis not present

## 2018-08-27 DIAGNOSIS — Z992 Dependence on renal dialysis: Secondary | ICD-10-CM | POA: Diagnosis not present

## 2018-08-27 DIAGNOSIS — N186 End stage renal disease: Secondary | ICD-10-CM | POA: Diagnosis not present

## 2018-08-27 DIAGNOSIS — D631 Anemia in chronic kidney disease: Secondary | ICD-10-CM | POA: Diagnosis not present

## 2018-08-29 DIAGNOSIS — N2581 Secondary hyperparathyroidism of renal origin: Secondary | ICD-10-CM | POA: Diagnosis not present

## 2018-08-29 DIAGNOSIS — Z992 Dependence on renal dialysis: Secondary | ICD-10-CM | POA: Diagnosis not present

## 2018-08-29 DIAGNOSIS — N186 End stage renal disease: Secondary | ICD-10-CM | POA: Diagnosis not present

## 2018-08-29 DIAGNOSIS — E1129 Type 2 diabetes mellitus with other diabetic kidney complication: Secondary | ICD-10-CM | POA: Diagnosis not present

## 2018-08-29 DIAGNOSIS — D631 Anemia in chronic kidney disease: Secondary | ICD-10-CM | POA: Diagnosis not present

## 2018-08-31 DIAGNOSIS — I120 Hypertensive chronic kidney disease with stage 5 chronic kidney disease or end stage renal disease: Secondary | ICD-10-CM | POA: Diagnosis not present

## 2018-08-31 DIAGNOSIS — N186 End stage renal disease: Secondary | ICD-10-CM | POA: Diagnosis not present

## 2018-08-31 DIAGNOSIS — Z992 Dependence on renal dialysis: Secondary | ICD-10-CM | POA: Diagnosis not present

## 2018-09-01 DIAGNOSIS — Z992 Dependence on renal dialysis: Secondary | ICD-10-CM | POA: Diagnosis not present

## 2018-09-01 DIAGNOSIS — N186 End stage renal disease: Secondary | ICD-10-CM | POA: Diagnosis not present

## 2018-09-01 DIAGNOSIS — Z23 Encounter for immunization: Secondary | ICD-10-CM | POA: Diagnosis not present

## 2018-09-01 DIAGNOSIS — E1129 Type 2 diabetes mellitus with other diabetic kidney complication: Secondary | ICD-10-CM | POA: Diagnosis not present

## 2018-09-01 DIAGNOSIS — N2581 Secondary hyperparathyroidism of renal origin: Secondary | ICD-10-CM | POA: Diagnosis not present

## 2018-09-01 DIAGNOSIS — D631 Anemia in chronic kidney disease: Secondary | ICD-10-CM | POA: Diagnosis not present

## 2018-09-03 DIAGNOSIS — Z992 Dependence on renal dialysis: Secondary | ICD-10-CM | POA: Diagnosis not present

## 2018-09-03 DIAGNOSIS — N186 End stage renal disease: Secondary | ICD-10-CM | POA: Diagnosis not present

## 2018-09-03 DIAGNOSIS — Z23 Encounter for immunization: Secondary | ICD-10-CM | POA: Diagnosis not present

## 2018-09-03 DIAGNOSIS — E1129 Type 2 diabetes mellitus with other diabetic kidney complication: Secondary | ICD-10-CM | POA: Diagnosis not present

## 2018-09-03 DIAGNOSIS — N2581 Secondary hyperparathyroidism of renal origin: Secondary | ICD-10-CM | POA: Diagnosis not present

## 2018-09-03 DIAGNOSIS — D631 Anemia in chronic kidney disease: Secondary | ICD-10-CM | POA: Diagnosis not present

## 2018-09-05 DIAGNOSIS — E1129 Type 2 diabetes mellitus with other diabetic kidney complication: Secondary | ICD-10-CM | POA: Diagnosis not present

## 2018-09-05 DIAGNOSIS — N2581 Secondary hyperparathyroidism of renal origin: Secondary | ICD-10-CM | POA: Diagnosis not present

## 2018-09-05 DIAGNOSIS — Z23 Encounter for immunization: Secondary | ICD-10-CM | POA: Diagnosis not present

## 2018-09-05 DIAGNOSIS — N186 End stage renal disease: Secondary | ICD-10-CM | POA: Diagnosis not present

## 2018-09-05 DIAGNOSIS — D631 Anemia in chronic kidney disease: Secondary | ICD-10-CM | POA: Diagnosis not present

## 2018-09-05 DIAGNOSIS — Z992 Dependence on renal dialysis: Secondary | ICD-10-CM | POA: Diagnosis not present

## 2018-09-08 DIAGNOSIS — E1129 Type 2 diabetes mellitus with other diabetic kidney complication: Secondary | ICD-10-CM | POA: Diagnosis not present

## 2018-09-08 DIAGNOSIS — N2581 Secondary hyperparathyroidism of renal origin: Secondary | ICD-10-CM | POA: Diagnosis not present

## 2018-09-08 DIAGNOSIS — N186 End stage renal disease: Secondary | ICD-10-CM | POA: Diagnosis not present

## 2018-09-08 DIAGNOSIS — Z992 Dependence on renal dialysis: Secondary | ICD-10-CM | POA: Diagnosis not present

## 2018-09-08 DIAGNOSIS — Z23 Encounter for immunization: Secondary | ICD-10-CM | POA: Diagnosis not present

## 2018-09-08 DIAGNOSIS — D631 Anemia in chronic kidney disease: Secondary | ICD-10-CM | POA: Diagnosis not present

## 2018-09-10 DIAGNOSIS — E1129 Type 2 diabetes mellitus with other diabetic kidney complication: Secondary | ICD-10-CM | POA: Diagnosis not present

## 2018-09-10 DIAGNOSIS — N2581 Secondary hyperparathyroidism of renal origin: Secondary | ICD-10-CM | POA: Diagnosis not present

## 2018-09-10 DIAGNOSIS — N186 End stage renal disease: Secondary | ICD-10-CM | POA: Diagnosis not present

## 2018-09-10 DIAGNOSIS — Z992 Dependence on renal dialysis: Secondary | ICD-10-CM | POA: Diagnosis not present

## 2018-09-10 DIAGNOSIS — Z23 Encounter for immunization: Secondary | ICD-10-CM | POA: Diagnosis not present

## 2018-09-10 DIAGNOSIS — D631 Anemia in chronic kidney disease: Secondary | ICD-10-CM | POA: Diagnosis not present

## 2018-09-12 DIAGNOSIS — N186 End stage renal disease: Secondary | ICD-10-CM | POA: Diagnosis not present

## 2018-09-12 DIAGNOSIS — E1129 Type 2 diabetes mellitus with other diabetic kidney complication: Secondary | ICD-10-CM | POA: Diagnosis not present

## 2018-09-12 DIAGNOSIS — Z992 Dependence on renal dialysis: Secondary | ICD-10-CM | POA: Diagnosis not present

## 2018-09-12 DIAGNOSIS — D631 Anemia in chronic kidney disease: Secondary | ICD-10-CM | POA: Diagnosis not present

## 2018-09-12 DIAGNOSIS — N2581 Secondary hyperparathyroidism of renal origin: Secondary | ICD-10-CM | POA: Diagnosis not present

## 2018-09-12 DIAGNOSIS — Z23 Encounter for immunization: Secondary | ICD-10-CM | POA: Diagnosis not present

## 2018-09-15 DIAGNOSIS — Z992 Dependence on renal dialysis: Secondary | ICD-10-CM | POA: Diagnosis not present

## 2018-09-15 DIAGNOSIS — N2581 Secondary hyperparathyroidism of renal origin: Secondary | ICD-10-CM | POA: Diagnosis not present

## 2018-09-15 DIAGNOSIS — Z23 Encounter for immunization: Secondary | ICD-10-CM | POA: Diagnosis not present

## 2018-09-15 DIAGNOSIS — N186 End stage renal disease: Secondary | ICD-10-CM | POA: Diagnosis not present

## 2018-09-15 DIAGNOSIS — D631 Anemia in chronic kidney disease: Secondary | ICD-10-CM | POA: Diagnosis not present

## 2018-09-15 DIAGNOSIS — E1129 Type 2 diabetes mellitus with other diabetic kidney complication: Secondary | ICD-10-CM | POA: Diagnosis not present

## 2018-09-17 DIAGNOSIS — Z992 Dependence on renal dialysis: Secondary | ICD-10-CM | POA: Diagnosis not present

## 2018-09-17 DIAGNOSIS — E1129 Type 2 diabetes mellitus with other diabetic kidney complication: Secondary | ICD-10-CM | POA: Diagnosis not present

## 2018-09-17 DIAGNOSIS — N2581 Secondary hyperparathyroidism of renal origin: Secondary | ICD-10-CM | POA: Diagnosis not present

## 2018-09-17 DIAGNOSIS — D631 Anemia in chronic kidney disease: Secondary | ICD-10-CM | POA: Diagnosis not present

## 2018-09-17 DIAGNOSIS — Z23 Encounter for immunization: Secondary | ICD-10-CM | POA: Diagnosis not present

## 2018-09-17 DIAGNOSIS — N186 End stage renal disease: Secondary | ICD-10-CM | POA: Diagnosis not present

## 2018-09-19 DIAGNOSIS — Z23 Encounter for immunization: Secondary | ICD-10-CM | POA: Diagnosis not present

## 2018-09-19 DIAGNOSIS — Z992 Dependence on renal dialysis: Secondary | ICD-10-CM | POA: Diagnosis not present

## 2018-09-19 DIAGNOSIS — E1129 Type 2 diabetes mellitus with other diabetic kidney complication: Secondary | ICD-10-CM | POA: Diagnosis not present

## 2018-09-19 DIAGNOSIS — N2581 Secondary hyperparathyroidism of renal origin: Secondary | ICD-10-CM | POA: Diagnosis not present

## 2018-09-19 DIAGNOSIS — D631 Anemia in chronic kidney disease: Secondary | ICD-10-CM | POA: Diagnosis not present

## 2018-09-19 DIAGNOSIS — N186 End stage renal disease: Secondary | ICD-10-CM | POA: Diagnosis not present

## 2018-09-22 DIAGNOSIS — Z992 Dependence on renal dialysis: Secondary | ICD-10-CM | POA: Diagnosis not present

## 2018-09-22 DIAGNOSIS — N2581 Secondary hyperparathyroidism of renal origin: Secondary | ICD-10-CM | POA: Diagnosis not present

## 2018-09-22 DIAGNOSIS — N186 End stage renal disease: Secondary | ICD-10-CM | POA: Diagnosis not present

## 2018-09-22 DIAGNOSIS — E1129 Type 2 diabetes mellitus with other diabetic kidney complication: Secondary | ICD-10-CM | POA: Diagnosis not present

## 2018-09-22 DIAGNOSIS — D631 Anemia in chronic kidney disease: Secondary | ICD-10-CM | POA: Diagnosis not present

## 2018-09-22 DIAGNOSIS — Z23 Encounter for immunization: Secondary | ICD-10-CM | POA: Diagnosis not present

## 2018-09-24 DIAGNOSIS — E1129 Type 2 diabetes mellitus with other diabetic kidney complication: Secondary | ICD-10-CM | POA: Diagnosis not present

## 2018-09-24 DIAGNOSIS — N2581 Secondary hyperparathyroidism of renal origin: Secondary | ICD-10-CM | POA: Diagnosis not present

## 2018-09-24 DIAGNOSIS — Z23 Encounter for immunization: Secondary | ICD-10-CM | POA: Diagnosis not present

## 2018-09-24 DIAGNOSIS — Z992 Dependence on renal dialysis: Secondary | ICD-10-CM | POA: Diagnosis not present

## 2018-09-24 DIAGNOSIS — N186 End stage renal disease: Secondary | ICD-10-CM | POA: Diagnosis not present

## 2018-09-24 DIAGNOSIS — D631 Anemia in chronic kidney disease: Secondary | ICD-10-CM | POA: Diagnosis not present

## 2018-09-25 ENCOUNTER — Other Ambulatory Visit: Payer: Self-pay

## 2018-09-25 NOTE — Patient Outreach (Addendum)
[facility]) Care Management  09/25/2018  [name] 10-May-1934 [id number]   Telephone Screen  Referral Date: 09/25/18 Referral Source: MD office Referral Reason: "contact on [place], COPD, DM, HTN, RF-ESRD, community nurse and home visits" Insurance:Medicare   Outreach attempt # 1 to patient. RN CM attempted main number listed for patient. A female answered and reported wrong number. RN CM attempted alternate number and no answer at present.     Plan: RN CM will make outreach attempt to patient within 3-4 business days.  RN CM will send unsuccessful outreach letter to patient.   Enzo Montgomery, RN,BSN,CCM [facility] Management Telephonic Care Management Coordinator Direct Phone: [phone] Toll Free: [phone] Fax: [phone]

## 2018-09-26 DIAGNOSIS — E1129 Type 2 diabetes mellitus with other diabetic kidney complication: Secondary | ICD-10-CM | POA: Diagnosis not present

## 2018-09-26 DIAGNOSIS — Z23 Encounter for immunization: Secondary | ICD-10-CM | POA: Diagnosis not present

## 2018-09-26 DIAGNOSIS — D631 Anemia in chronic kidney disease: Secondary | ICD-10-CM | POA: Diagnosis not present

## 2018-09-26 DIAGNOSIS — Z992 Dependence on renal dialysis: Secondary | ICD-10-CM | POA: Diagnosis not present

## 2018-09-26 DIAGNOSIS — N186 End stage renal disease: Secondary | ICD-10-CM | POA: Diagnosis not present

## 2018-09-26 DIAGNOSIS — N2581 Secondary hyperparathyroidism of renal origin: Secondary | ICD-10-CM | POA: Diagnosis not present

## 2018-09-29 DIAGNOSIS — E1129 Type 2 diabetes mellitus with other diabetic kidney complication: Secondary | ICD-10-CM | POA: Diagnosis not present

## 2018-09-29 DIAGNOSIS — Z992 Dependence on renal dialysis: Secondary | ICD-10-CM | POA: Diagnosis not present

## 2018-09-29 DIAGNOSIS — N186 End stage renal disease: Secondary | ICD-10-CM | POA: Diagnosis not present

## 2018-09-29 DIAGNOSIS — Z23 Encounter for immunization: Secondary | ICD-10-CM | POA: Diagnosis not present

## 2018-09-29 DIAGNOSIS — N2581 Secondary hyperparathyroidism of renal origin: Secondary | ICD-10-CM | POA: Diagnosis not present

## 2018-09-29 DIAGNOSIS — D631 Anemia in chronic kidney disease: Secondary | ICD-10-CM | POA: Diagnosis not present

## 2018-09-30 ENCOUNTER — Other Ambulatory Visit: Payer: Self-pay

## 2018-09-30 DIAGNOSIS — Z992 Dependence on renal dialysis: Secondary | ICD-10-CM | POA: Diagnosis not present

## 2018-09-30 DIAGNOSIS — I120 Hypertensive chronic kidney disease with stage 5 chronic kidney disease or end stage renal disease: Secondary | ICD-10-CM | POA: Diagnosis not present

## 2018-09-30 DIAGNOSIS — N186 End stage renal disease: Secondary | ICD-10-CM | POA: Diagnosis not present

## 2018-09-30 NOTE — Patient Outreach (Signed)
[facility]) Care Management  09/30/2018  [name] May 15, 1934 [id number]   Telephone Screen  Referral Date: 09/25/18 Referral Source: MD office Referral Reason: "contact on [place], COPD, DM, HTN, RF-ESRD, community nurse and home visits" Insurance:Medicare   Outreach attempt #2 to patient. No answer at present.     Plan: RN CM will make outreach attempt to patient within 3-4 business days.  Enzo Montgomery, RN,BSN,CCM [place] Management Telephonic Care Management Coordinator Direct Phone: [phone] Toll Free: [phone] Fax: [phone]

## 2018-10-01 DIAGNOSIS — Z992 Dependence on renal dialysis: Secondary | ICD-10-CM | POA: Diagnosis not present

## 2018-10-01 DIAGNOSIS — D631 Anemia in chronic kidney disease: Secondary | ICD-10-CM | POA: Diagnosis not present

## 2018-10-01 DIAGNOSIS — Z23 Encounter for immunization: Secondary | ICD-10-CM | POA: Diagnosis not present

## 2018-10-01 DIAGNOSIS — N2581 Secondary hyperparathyroidism of renal origin: Secondary | ICD-10-CM | POA: Diagnosis not present

## 2018-10-01 DIAGNOSIS — N186 End stage renal disease: Secondary | ICD-10-CM | POA: Diagnosis not present

## 2018-10-01 DIAGNOSIS — E1129 Type 2 diabetes mellitus with other diabetic kidney complication: Secondary | ICD-10-CM | POA: Diagnosis not present

## 2018-10-02 ENCOUNTER — Other Ambulatory Visit: Payer: Self-pay

## 2018-10-02 NOTE — Patient Outreach (Signed)
[facility]) Care Management  10/02/2018  [name] June 21, 1934 [id number]   Telephone Screen  Referral Date:09/25/18 Referral Source:MD office Referral Reason:"contact on Tues & Thurs, COPD, DM, HTN, RF-ESRD, community nurse and home visits" Insurance:Medicare    Outreach attempt # 3 to patient. Spoke with daughter-Rosa(DPR on file). Screening completed.  Social: Daughter voices that patient lives in her own home. She stets that patient's granddaughter lives with patient but she does not provide any support or assistance to patient. Caregiver voices that she "needs help" with caring for her mother. She voices that she along with her daughter and granddaughter are primarily providing all the care to patient. Patient requires assistance with ADLs/IADLs. She is requesting assistance with personal care and HHPT. She uses [facility] for transportation to dialysis and family takes her to MD appts.    Conditions: Per chart review, patient has PMH of ESRD(about 10 yrs), pulmonary HTN, HTN, out, HLD, chronic back pain, anemia, asthma, GERD and PAD. Daughter voices that patient no longer on diabetic meds due to weight loss and diabetes managed at this time. No recent A1C. She denies patient needing any further assistance or support in managing conditions.   Medications: Daughter unsure of exact number of meds patient taking. He voices that she picks up patient's meds but patient self manages her meds on her own. She takes meds directly from pill bottle.    Appointments: Patient followed by PCP and sees q6months . Daughter voices that patient will make an appt for patient.   Consent: [facility] services reviewed and discussed with daughter. Services that daughter is requesting are for personal care aide services and HHPT. Advised her that [facility] does not provide these services. Discussed with daughter what services [facility] can assist with. Daughter interested and agreeable to SW support.       Plan: RN CM will send [facility] SW referral for possible community resources and in home support assistance and Medicaid info.    Enzo Montgomery, RN,BSN,CCM [place] Management Telephonic Care Management Coordinator Direct Phone: [phone] Toll Free: [phone] Fax: [phone]

## 2018-10-03 DIAGNOSIS — E1129 Type 2 diabetes mellitus with other diabetic kidney complication: Secondary | ICD-10-CM | POA: Diagnosis not present

## 2018-10-03 DIAGNOSIS — Z23 Encounter for immunization: Secondary | ICD-10-CM | POA: Diagnosis not present

## 2018-10-03 DIAGNOSIS — D631 Anemia in chronic kidney disease: Secondary | ICD-10-CM | POA: Diagnosis not present

## 2018-10-03 DIAGNOSIS — N186 End stage renal disease: Secondary | ICD-10-CM | POA: Diagnosis not present

## 2018-10-03 DIAGNOSIS — Z992 Dependence on renal dialysis: Secondary | ICD-10-CM | POA: Diagnosis not present

## 2018-10-03 DIAGNOSIS — N2581 Secondary hyperparathyroidism of renal origin: Secondary | ICD-10-CM | POA: Diagnosis not present

## 2018-10-06 ENCOUNTER — Other Ambulatory Visit: Payer: Self-pay

## 2018-10-06 DIAGNOSIS — D631 Anemia in chronic kidney disease: Secondary | ICD-10-CM | POA: Diagnosis not present

## 2018-10-06 DIAGNOSIS — E1129 Type 2 diabetes mellitus with other diabetic kidney complication: Secondary | ICD-10-CM | POA: Diagnosis not present

## 2018-10-06 DIAGNOSIS — Z23 Encounter for immunization: Secondary | ICD-10-CM | POA: Diagnosis not present

## 2018-10-06 DIAGNOSIS — N186 End stage renal disease: Secondary | ICD-10-CM | POA: Diagnosis not present

## 2018-10-06 DIAGNOSIS — N2581 Secondary hyperparathyroidism of renal origin: Secondary | ICD-10-CM | POA: Diagnosis not present

## 2018-10-06 DIAGNOSIS — Z992 Dependence on renal dialysis: Secondary | ICD-10-CM | POA: Diagnosis not present

## 2018-10-06 NOTE — Patient Outreach (Signed)
[facility]) Care Management  10/06/2018  [name] 1934-01-16 [id number]  Incoming call from the patients daughter, [name] who is able to confirm patients HIPAA identifiers. BSW introduced self to [name] and discussed the patients recent referral for caregiver resources. [name] reports her mother lives alone and needs assistance with cooking and cleaning. BSW spoke with [name] about options to privately pay for a caregiver. [name] reports an inability to pay for services. [name] declines assistance in completing a Medicaid application considering the patient makes a reported income of $2200 per month.   Plan: BSW to mail a list of home care providers who service [place] to Ms. Watlingtons address for her review. BSW to contact [name] in the next two weeks to confirm receipt.  [name], BSW, CDP [facility] [phone]

## 2018-10-06 NOTE — Patient Outreach (Signed)
[facility]) Care Management  10/06/2018  KADIJA CRUZEN Mar 23, 1934 [id number]  BSW attempted to contact the patient on today's date to conduct a community resource consult. Unfortunately, today's call was unsuccessful. BSW left the patient a HIPAA compliant voice message requesting a return call.   Plan: BSW will mail the patient an unsuccessful outreach letter. BSW will attempt the patient again within the next four business days.  Daneen Schick, BSW, CDP [facility] [phone]

## 2018-10-08 DIAGNOSIS — E1129 Type 2 diabetes mellitus with other diabetic kidney complication: Secondary | ICD-10-CM | POA: Diagnosis not present

## 2018-10-08 DIAGNOSIS — N186 End stage renal disease: Secondary | ICD-10-CM | POA: Diagnosis not present

## 2018-10-08 DIAGNOSIS — N2581 Secondary hyperparathyroidism of renal origin: Secondary | ICD-10-CM | POA: Diagnosis not present

## 2018-10-08 DIAGNOSIS — D631 Anemia in chronic kidney disease: Secondary | ICD-10-CM | POA: Diagnosis not present

## 2018-10-08 DIAGNOSIS — Z992 Dependence on renal dialysis: Secondary | ICD-10-CM | POA: Diagnosis not present

## 2018-10-08 DIAGNOSIS — Z23 Encounter for immunization: Secondary | ICD-10-CM | POA: Diagnosis not present

## 2018-10-09 ENCOUNTER — Ambulatory Visit: Payer: Medicare Other

## 2018-10-10 DIAGNOSIS — Z23 Encounter for immunization: Secondary | ICD-10-CM | POA: Diagnosis not present

## 2018-10-10 DIAGNOSIS — D631 Anemia in chronic kidney disease: Secondary | ICD-10-CM | POA: Diagnosis not present

## 2018-10-10 DIAGNOSIS — Z992 Dependence on renal dialysis: Secondary | ICD-10-CM | POA: Diagnosis not present

## 2018-10-10 DIAGNOSIS — N2581 Secondary hyperparathyroidism of renal origin: Secondary | ICD-10-CM | POA: Diagnosis not present

## 2018-10-10 DIAGNOSIS — N186 End stage renal disease: Secondary | ICD-10-CM | POA: Diagnosis not present

## 2018-10-10 DIAGNOSIS — E1129 Type 2 diabetes mellitus with other diabetic kidney complication: Secondary | ICD-10-CM | POA: Diagnosis not present

## 2018-10-13 DIAGNOSIS — Z23 Encounter for immunization: Secondary | ICD-10-CM | POA: Diagnosis not present

## 2018-10-13 DIAGNOSIS — N186 End stage renal disease: Secondary | ICD-10-CM | POA: Diagnosis not present

## 2018-10-13 DIAGNOSIS — Z992 Dependence on renal dialysis: Secondary | ICD-10-CM | POA: Diagnosis not present

## 2018-10-13 DIAGNOSIS — N2581 Secondary hyperparathyroidism of renal origin: Secondary | ICD-10-CM | POA: Diagnosis not present

## 2018-10-13 DIAGNOSIS — E1129 Type 2 diabetes mellitus with other diabetic kidney complication: Secondary | ICD-10-CM | POA: Diagnosis not present

## 2018-10-13 DIAGNOSIS — D631 Anemia in chronic kidney disease: Secondary | ICD-10-CM | POA: Diagnosis not present

## 2018-10-15 DIAGNOSIS — N2581 Secondary hyperparathyroidism of renal origin: Secondary | ICD-10-CM | POA: Diagnosis not present

## 2018-10-15 DIAGNOSIS — Z992 Dependence on renal dialysis: Secondary | ICD-10-CM | POA: Diagnosis not present

## 2018-10-15 DIAGNOSIS — E1129 Type 2 diabetes mellitus with other diabetic kidney complication: Secondary | ICD-10-CM | POA: Diagnosis not present

## 2018-10-15 DIAGNOSIS — Z23 Encounter for immunization: Secondary | ICD-10-CM | POA: Diagnosis not present

## 2018-10-15 DIAGNOSIS — D631 Anemia in chronic kidney disease: Secondary | ICD-10-CM | POA: Diagnosis not present

## 2018-10-15 DIAGNOSIS — N186 End stage renal disease: Secondary | ICD-10-CM | POA: Diagnosis not present

## 2018-10-17 DIAGNOSIS — Z23 Encounter for immunization: Secondary | ICD-10-CM | POA: Diagnosis not present

## 2018-10-17 DIAGNOSIS — Z992 Dependence on renal dialysis: Secondary | ICD-10-CM | POA: Diagnosis not present

## 2018-10-17 DIAGNOSIS — N2581 Secondary hyperparathyroidism of renal origin: Secondary | ICD-10-CM | POA: Diagnosis not present

## 2018-10-17 DIAGNOSIS — E1129 Type 2 diabetes mellitus with other diabetic kidney complication: Secondary | ICD-10-CM | POA: Diagnosis not present

## 2018-10-17 DIAGNOSIS — N186 End stage renal disease: Secondary | ICD-10-CM | POA: Diagnosis not present

## 2018-10-17 DIAGNOSIS — D631 Anemia in chronic kidney disease: Secondary | ICD-10-CM | POA: Diagnosis not present

## 2018-10-20 ENCOUNTER — Ambulatory Visit: Payer: Self-pay

## 2018-10-20 ENCOUNTER — Other Ambulatory Visit: Payer: Self-pay

## 2018-10-20 DIAGNOSIS — E1129 Type 2 diabetes mellitus with other diabetic kidney complication: Secondary | ICD-10-CM | POA: Diagnosis not present

## 2018-10-20 DIAGNOSIS — D631 Anemia in chronic kidney disease: Secondary | ICD-10-CM | POA: Diagnosis not present

## 2018-10-20 DIAGNOSIS — Z992 Dependence on renal dialysis: Secondary | ICD-10-CM | POA: Diagnosis not present

## 2018-10-20 DIAGNOSIS — Z23 Encounter for immunization: Secondary | ICD-10-CM | POA: Diagnosis not present

## 2018-10-20 DIAGNOSIS — N186 End stage renal disease: Secondary | ICD-10-CM | POA: Diagnosis not present

## 2018-10-20 DIAGNOSIS — N2581 Secondary hyperparathyroidism of renal origin: Secondary | ICD-10-CM | POA: Diagnosis not present

## 2018-10-20 NOTE — Patient Outreach (Signed)
[facility]) Care Management  10/20/2018  TANAYAH SQUITIERI 1934-12-22 [id number]  Unsuccessful outreach to the patients daughter, Glori Luis, in efforts to confirm receipt of mailed resources. BSW left a HIPAA compliant voice message requesting a return call.  Plan: BSW will attempt another outreach within the next four business days pending a return call is not received.  Daneen Schick, BSW, CDP [facility] [phone]

## 2018-10-22 ENCOUNTER — Ambulatory Visit: Payer: Self-pay

## 2018-10-22 ENCOUNTER — Other Ambulatory Visit: Payer: Self-pay

## 2018-10-22 DIAGNOSIS — D631 Anemia in chronic kidney disease: Secondary | ICD-10-CM | POA: Diagnosis not present

## 2018-10-22 DIAGNOSIS — N2581 Secondary hyperparathyroidism of renal origin: Secondary | ICD-10-CM | POA: Diagnosis not present

## 2018-10-22 DIAGNOSIS — Z992 Dependence on renal dialysis: Secondary | ICD-10-CM | POA: Diagnosis not present

## 2018-10-22 DIAGNOSIS — N186 End stage renal disease: Secondary | ICD-10-CM | POA: Diagnosis not present

## 2018-10-22 DIAGNOSIS — Z23 Encounter for immunization: Secondary | ICD-10-CM | POA: Diagnosis not present

## 2018-10-22 DIAGNOSIS — E1129 Type 2 diabetes mellitus with other diabetic kidney complication: Secondary | ICD-10-CM | POA: Diagnosis not present

## 2018-10-22 NOTE — Patient Outreach (Signed)
[facility]) Care Management  10/22/2018  [name] 1934-11-02 [id number]  BSW attempted to contact the patient on today's date to confirm receipt of resources. The number listed as the patients home number ([phone]) is an invalid number. BSW placed a call to the patients daughter, [name]. Unfortunately, this call was also unsuccessful. BSW left a HIPAA compliant voice message requesting a return call.  Plan: BSW will attempt a third and final outreach within the next four days. If BSW is unable to establish contact, BSW will perform a case closure.  Daneen Schick, BSW, CDP [facility] [phone]

## 2018-10-24 DIAGNOSIS — Z992 Dependence on renal dialysis: Secondary | ICD-10-CM | POA: Diagnosis not present

## 2018-10-24 DIAGNOSIS — D631 Anemia in chronic kidney disease: Secondary | ICD-10-CM | POA: Diagnosis not present

## 2018-10-24 DIAGNOSIS — N2581 Secondary hyperparathyroidism of renal origin: Secondary | ICD-10-CM | POA: Diagnosis not present

## 2018-10-24 DIAGNOSIS — Z23 Encounter for immunization: Secondary | ICD-10-CM | POA: Diagnosis not present

## 2018-10-24 DIAGNOSIS — N186 End stage renal disease: Secondary | ICD-10-CM | POA: Diagnosis not present

## 2018-10-24 DIAGNOSIS — E1129 Type 2 diabetes mellitus with other diabetic kidney complication: Secondary | ICD-10-CM | POA: Diagnosis not present

## 2018-10-27 DIAGNOSIS — Z992 Dependence on renal dialysis: Secondary | ICD-10-CM | POA: Diagnosis not present

## 2018-10-27 DIAGNOSIS — N186 End stage renal disease: Secondary | ICD-10-CM | POA: Diagnosis not present

## 2018-10-27 DIAGNOSIS — E1129 Type 2 diabetes mellitus with other diabetic kidney complication: Secondary | ICD-10-CM | POA: Diagnosis not present

## 2018-10-27 DIAGNOSIS — N2581 Secondary hyperparathyroidism of renal origin: Secondary | ICD-10-CM | POA: Diagnosis not present

## 2018-10-27 DIAGNOSIS — D631 Anemia in chronic kidney disease: Secondary | ICD-10-CM | POA: Diagnosis not present

## 2018-10-27 DIAGNOSIS — Z23 Encounter for immunization: Secondary | ICD-10-CM | POA: Diagnosis not present

## 2018-10-28 ENCOUNTER — Other Ambulatory Visit: Payer: Self-pay

## 2018-10-28 ENCOUNTER — Ambulatory Visit: Payer: Self-pay

## 2018-10-28 NOTE — Patient Outreach (Signed)
[name] Care Management  10/28/2018  [name] 04/28/1934 [id number]  Third unsuccessful outreach to the patients daughter, [name], in efforts to confirm receipt of mailed resources. BSW left a HIPAA compliant voice message requesting a return call.  Plan: BSW will plan to perform a case closure on Friday November 1 if a return call is not received due to an inability to maintain contact.  Daneen Schick, BSW, CDP [facility] [phone]

## 2018-10-29 DIAGNOSIS — E1129 Type 2 diabetes mellitus with other diabetic kidney complication: Secondary | ICD-10-CM | POA: Diagnosis not present

## 2018-10-29 DIAGNOSIS — Z992 Dependence on renal dialysis: Secondary | ICD-10-CM | POA: Diagnosis not present

## 2018-10-29 DIAGNOSIS — D631 Anemia in chronic kidney disease: Secondary | ICD-10-CM | POA: Diagnosis not present

## 2018-10-29 DIAGNOSIS — Z23 Encounter for immunization: Secondary | ICD-10-CM | POA: Diagnosis not present

## 2018-10-29 DIAGNOSIS — N186 End stage renal disease: Secondary | ICD-10-CM | POA: Diagnosis not present

## 2018-10-29 DIAGNOSIS — N2581 Secondary hyperparathyroidism of renal origin: Secondary | ICD-10-CM | POA: Diagnosis not present

## 2018-10-31 ENCOUNTER — Other Ambulatory Visit: Payer: Self-pay

## 2018-10-31 DIAGNOSIS — E1129 Type 2 diabetes mellitus with other diabetic kidney complication: Secondary | ICD-10-CM | POA: Diagnosis not present

## 2018-10-31 DIAGNOSIS — D631 Anemia in chronic kidney disease: Secondary | ICD-10-CM | POA: Diagnosis not present

## 2018-10-31 DIAGNOSIS — N186 End stage renal disease: Secondary | ICD-10-CM | POA: Diagnosis not present

## 2018-10-31 DIAGNOSIS — I120 Hypertensive chronic kidney disease with stage 5 chronic kidney disease or end stage renal disease: Secondary | ICD-10-CM | POA: Diagnosis not present

## 2018-10-31 DIAGNOSIS — Z992 Dependence on renal dialysis: Secondary | ICD-10-CM | POA: Diagnosis not present

## 2018-10-31 DIAGNOSIS — N2581 Secondary hyperparathyroidism of renal origin: Secondary | ICD-10-CM | POA: Diagnosis not present

## 2018-10-31 NOTE — Patient Outreach (Signed)
[place] [facility]) Care Management  10/31/2018  [name] 1934-11-20 [id number]   Case closure performed on today's date due to an inability to maintain contact with the patient. BSW has mailed a case closure letter to the patients home address. BSW also sent communication to the patients primary provider notifying of case closure.  Daneen Schick, BSW, CDP [facility] [phone]

## 2018-11-03 DIAGNOSIS — D631 Anemia in chronic kidney disease: Secondary | ICD-10-CM | POA: Diagnosis not present

## 2018-11-03 DIAGNOSIS — N2581 Secondary hyperparathyroidism of renal origin: Secondary | ICD-10-CM | POA: Diagnosis not present

## 2018-11-03 DIAGNOSIS — Z992 Dependence on renal dialysis: Secondary | ICD-10-CM | POA: Diagnosis not present

## 2018-11-03 DIAGNOSIS — E1129 Type 2 diabetes mellitus with other diabetic kidney complication: Secondary | ICD-10-CM | POA: Diagnosis not present

## 2018-11-03 DIAGNOSIS — N186 End stage renal disease: Secondary | ICD-10-CM | POA: Diagnosis not present

## 2018-11-05 DIAGNOSIS — N186 End stage renal disease: Secondary | ICD-10-CM | POA: Diagnosis not present

## 2018-11-05 DIAGNOSIS — Z992 Dependence on renal dialysis: Secondary | ICD-10-CM | POA: Diagnosis not present

## 2018-11-05 DIAGNOSIS — E1129 Type 2 diabetes mellitus with other diabetic kidney complication: Secondary | ICD-10-CM | POA: Diagnosis not present

## 2018-11-05 DIAGNOSIS — N2581 Secondary hyperparathyroidism of renal origin: Secondary | ICD-10-CM | POA: Diagnosis not present

## 2018-11-05 DIAGNOSIS — D631 Anemia in chronic kidney disease: Secondary | ICD-10-CM | POA: Diagnosis not present

## 2018-11-07 DIAGNOSIS — D631 Anemia in chronic kidney disease: Secondary | ICD-10-CM | POA: Diagnosis not present

## 2018-11-07 DIAGNOSIS — N2581 Secondary hyperparathyroidism of renal origin: Secondary | ICD-10-CM | POA: Diagnosis not present

## 2018-11-07 DIAGNOSIS — Z992 Dependence on renal dialysis: Secondary | ICD-10-CM | POA: Diagnosis not present

## 2018-11-07 DIAGNOSIS — E1129 Type 2 diabetes mellitus with other diabetic kidney complication: Secondary | ICD-10-CM | POA: Diagnosis not present

## 2018-11-07 DIAGNOSIS — N186 End stage renal disease: Secondary | ICD-10-CM | POA: Diagnosis not present

## 2018-11-10 DIAGNOSIS — N186 End stage renal disease: Secondary | ICD-10-CM | POA: Diagnosis not present

## 2018-11-10 DIAGNOSIS — Z992 Dependence on renal dialysis: Secondary | ICD-10-CM | POA: Diagnosis not present

## 2018-11-10 DIAGNOSIS — E1129 Type 2 diabetes mellitus with other diabetic kidney complication: Secondary | ICD-10-CM | POA: Diagnosis not present

## 2018-11-10 DIAGNOSIS — D631 Anemia in chronic kidney disease: Secondary | ICD-10-CM | POA: Diagnosis not present

## 2018-11-10 DIAGNOSIS — N2581 Secondary hyperparathyroidism of renal origin: Secondary | ICD-10-CM | POA: Diagnosis not present

## 2018-11-12 DIAGNOSIS — N2581 Secondary hyperparathyroidism of renal origin: Secondary | ICD-10-CM | POA: Diagnosis not present

## 2018-11-12 DIAGNOSIS — E1129 Type 2 diabetes mellitus with other diabetic kidney complication: Secondary | ICD-10-CM | POA: Diagnosis not present

## 2018-11-12 DIAGNOSIS — N186 End stage renal disease: Secondary | ICD-10-CM | POA: Diagnosis not present

## 2018-11-12 DIAGNOSIS — Z992 Dependence on renal dialysis: Secondary | ICD-10-CM | POA: Diagnosis not present

## 2018-11-12 DIAGNOSIS — D631 Anemia in chronic kidney disease: Secondary | ICD-10-CM | POA: Diagnosis not present

## 2018-11-14 DIAGNOSIS — D631 Anemia in chronic kidney disease: Secondary | ICD-10-CM | POA: Diagnosis not present

## 2018-11-14 DIAGNOSIS — N186 End stage renal disease: Secondary | ICD-10-CM | POA: Diagnosis not present

## 2018-11-14 DIAGNOSIS — N2581 Secondary hyperparathyroidism of renal origin: Secondary | ICD-10-CM | POA: Diagnosis not present

## 2018-11-14 DIAGNOSIS — Z992 Dependence on renal dialysis: Secondary | ICD-10-CM | POA: Diagnosis not present

## 2018-11-14 DIAGNOSIS — E1129 Type 2 diabetes mellitus with other diabetic kidney complication: Secondary | ICD-10-CM | POA: Diagnosis not present

## 2018-11-17 DIAGNOSIS — Z992 Dependence on renal dialysis: Secondary | ICD-10-CM | POA: Diagnosis not present

## 2018-11-17 DIAGNOSIS — D631 Anemia in chronic kidney disease: Secondary | ICD-10-CM | POA: Diagnosis not present

## 2018-11-17 DIAGNOSIS — E1129 Type 2 diabetes mellitus with other diabetic kidney complication: Secondary | ICD-10-CM | POA: Diagnosis not present

## 2018-11-17 DIAGNOSIS — N2581 Secondary hyperparathyroidism of renal origin: Secondary | ICD-10-CM | POA: Diagnosis not present

## 2018-11-17 DIAGNOSIS — N186 End stage renal disease: Secondary | ICD-10-CM | POA: Diagnosis not present

## 2018-11-19 DIAGNOSIS — N186 End stage renal disease: Secondary | ICD-10-CM | POA: Diagnosis not present

## 2018-11-19 DIAGNOSIS — N2581 Secondary hyperparathyroidism of renal origin: Secondary | ICD-10-CM | POA: Diagnosis not present

## 2018-11-19 DIAGNOSIS — D631 Anemia in chronic kidney disease: Secondary | ICD-10-CM | POA: Diagnosis not present

## 2018-11-19 DIAGNOSIS — Z992 Dependence on renal dialysis: Secondary | ICD-10-CM | POA: Diagnosis not present

## 2018-11-19 DIAGNOSIS — E1129 Type 2 diabetes mellitus with other diabetic kidney complication: Secondary | ICD-10-CM | POA: Diagnosis not present

## 2018-11-21 DIAGNOSIS — N2581 Secondary hyperparathyroidism of renal origin: Secondary | ICD-10-CM | POA: Diagnosis not present

## 2018-11-21 DIAGNOSIS — E1129 Type 2 diabetes mellitus with other diabetic kidney complication: Secondary | ICD-10-CM | POA: Diagnosis not present

## 2018-11-21 DIAGNOSIS — Z992 Dependence on renal dialysis: Secondary | ICD-10-CM | POA: Diagnosis not present

## 2018-11-21 DIAGNOSIS — N186 End stage renal disease: Secondary | ICD-10-CM | POA: Diagnosis not present

## 2018-11-21 DIAGNOSIS — D631 Anemia in chronic kidney disease: Secondary | ICD-10-CM | POA: Diagnosis not present

## 2018-11-23 DIAGNOSIS — Z992 Dependence on renal dialysis: Secondary | ICD-10-CM | POA: Diagnosis not present

## 2018-11-23 DIAGNOSIS — N2581 Secondary hyperparathyroidism of renal origin: Secondary | ICD-10-CM | POA: Diagnosis not present

## 2018-11-23 DIAGNOSIS — N186 End stage renal disease: Secondary | ICD-10-CM | POA: Diagnosis not present

## 2018-11-23 DIAGNOSIS — E1129 Type 2 diabetes mellitus with other diabetic kidney complication: Secondary | ICD-10-CM | POA: Diagnosis not present

## 2018-11-23 DIAGNOSIS — D631 Anemia in chronic kidney disease: Secondary | ICD-10-CM | POA: Diagnosis not present

## 2018-11-25 DIAGNOSIS — E1129 Type 2 diabetes mellitus with other diabetic kidney complication: Secondary | ICD-10-CM | POA: Diagnosis not present

## 2018-11-25 DIAGNOSIS — N186 End stage renal disease: Secondary | ICD-10-CM | POA: Diagnosis not present

## 2018-11-25 DIAGNOSIS — Z992 Dependence on renal dialysis: Secondary | ICD-10-CM | POA: Diagnosis not present

## 2018-11-25 DIAGNOSIS — D631 Anemia in chronic kidney disease: Secondary | ICD-10-CM | POA: Diagnosis not present

## 2018-11-25 DIAGNOSIS — N2581 Secondary hyperparathyroidism of renal origin: Secondary | ICD-10-CM | POA: Diagnosis not present

## 2018-11-28 DIAGNOSIS — D631 Anemia in chronic kidney disease: Secondary | ICD-10-CM | POA: Diagnosis not present

## 2018-11-28 DIAGNOSIS — N2581 Secondary hyperparathyroidism of renal origin: Secondary | ICD-10-CM | POA: Diagnosis not present

## 2018-11-28 DIAGNOSIS — Z992 Dependence on renal dialysis: Secondary | ICD-10-CM | POA: Diagnosis not present

## 2018-11-28 DIAGNOSIS — N186 End stage renal disease: Secondary | ICD-10-CM | POA: Diagnosis not present

## 2018-11-28 DIAGNOSIS — E1129 Type 2 diabetes mellitus with other diabetic kidney complication: Secondary | ICD-10-CM | POA: Diagnosis not present

## 2018-11-30 DIAGNOSIS — N186 End stage renal disease: Secondary | ICD-10-CM | POA: Diagnosis not present

## 2018-11-30 DIAGNOSIS — I120 Hypertensive chronic kidney disease with stage 5 chronic kidney disease or end stage renal disease: Secondary | ICD-10-CM | POA: Diagnosis not present

## 2018-11-30 DIAGNOSIS — Z992 Dependence on renal dialysis: Secondary | ICD-10-CM | POA: Diagnosis not present

## 2018-12-01 DIAGNOSIS — E1129 Type 2 diabetes mellitus with other diabetic kidney complication: Secondary | ICD-10-CM | POA: Diagnosis not present

## 2018-12-01 DIAGNOSIS — D509 Iron deficiency anemia, unspecified: Secondary | ICD-10-CM | POA: Diagnosis not present

## 2018-12-01 DIAGNOSIS — D631 Anemia in chronic kidney disease: Secondary | ICD-10-CM | POA: Diagnosis not present

## 2018-12-01 DIAGNOSIS — N186 End stage renal disease: Secondary | ICD-10-CM | POA: Diagnosis not present

## 2018-12-01 DIAGNOSIS — Z992 Dependence on renal dialysis: Secondary | ICD-10-CM | POA: Diagnosis not present

## 2018-12-01 DIAGNOSIS — N2581 Secondary hyperparathyroidism of renal origin: Secondary | ICD-10-CM | POA: Diagnosis not present

## 2018-12-03 DIAGNOSIS — N2581 Secondary hyperparathyroidism of renal origin: Secondary | ICD-10-CM | POA: Diagnosis not present

## 2018-12-03 DIAGNOSIS — Z992 Dependence on renal dialysis: Secondary | ICD-10-CM | POA: Diagnosis not present

## 2018-12-03 DIAGNOSIS — D509 Iron deficiency anemia, unspecified: Secondary | ICD-10-CM | POA: Diagnosis not present

## 2018-12-03 DIAGNOSIS — D631 Anemia in chronic kidney disease: Secondary | ICD-10-CM | POA: Diagnosis not present

## 2018-12-03 DIAGNOSIS — E1129 Type 2 diabetes mellitus with other diabetic kidney complication: Secondary | ICD-10-CM | POA: Diagnosis not present

## 2018-12-03 DIAGNOSIS — N186 End stage renal disease: Secondary | ICD-10-CM | POA: Diagnosis not present

## 2018-12-05 DIAGNOSIS — N186 End stage renal disease: Secondary | ICD-10-CM | POA: Diagnosis not present

## 2018-12-05 DIAGNOSIS — N2581 Secondary hyperparathyroidism of renal origin: Secondary | ICD-10-CM | POA: Diagnosis not present

## 2018-12-05 DIAGNOSIS — E1129 Type 2 diabetes mellitus with other diabetic kidney complication: Secondary | ICD-10-CM | POA: Diagnosis not present

## 2018-12-05 DIAGNOSIS — D631 Anemia in chronic kidney disease: Secondary | ICD-10-CM | POA: Diagnosis not present

## 2018-12-05 DIAGNOSIS — D509 Iron deficiency anemia, unspecified: Secondary | ICD-10-CM | POA: Diagnosis not present

## 2018-12-05 DIAGNOSIS — Z992 Dependence on renal dialysis: Secondary | ICD-10-CM | POA: Diagnosis not present

## 2018-12-08 DIAGNOSIS — D631 Anemia in chronic kidney disease: Secondary | ICD-10-CM | POA: Diagnosis not present

## 2018-12-08 DIAGNOSIS — N2581 Secondary hyperparathyroidism of renal origin: Secondary | ICD-10-CM | POA: Diagnosis not present

## 2018-12-08 DIAGNOSIS — E1129 Type 2 diabetes mellitus with other diabetic kidney complication: Secondary | ICD-10-CM | POA: Diagnosis not present

## 2018-12-08 DIAGNOSIS — Z992 Dependence on renal dialysis: Secondary | ICD-10-CM | POA: Diagnosis not present

## 2018-12-08 DIAGNOSIS — D509 Iron deficiency anemia, unspecified: Secondary | ICD-10-CM | POA: Diagnosis not present

## 2018-12-08 DIAGNOSIS — N186 End stage renal disease: Secondary | ICD-10-CM | POA: Diagnosis not present

## 2018-12-10 DIAGNOSIS — N2581 Secondary hyperparathyroidism of renal origin: Secondary | ICD-10-CM | POA: Diagnosis not present

## 2018-12-10 DIAGNOSIS — Z992 Dependence on renal dialysis: Secondary | ICD-10-CM | POA: Diagnosis not present

## 2018-12-10 DIAGNOSIS — N186 End stage renal disease: Secondary | ICD-10-CM | POA: Diagnosis not present

## 2018-12-10 DIAGNOSIS — D509 Iron deficiency anemia, unspecified: Secondary | ICD-10-CM | POA: Diagnosis not present

## 2018-12-10 DIAGNOSIS — E1129 Type 2 diabetes mellitus with other diabetic kidney complication: Secondary | ICD-10-CM | POA: Diagnosis not present

## 2018-12-10 DIAGNOSIS — D631 Anemia in chronic kidney disease: Secondary | ICD-10-CM | POA: Diagnosis not present

## 2018-12-12 DIAGNOSIS — N186 End stage renal disease: Secondary | ICD-10-CM | POA: Diagnosis not present

## 2018-12-12 DIAGNOSIS — E1129 Type 2 diabetes mellitus with other diabetic kidney complication: Secondary | ICD-10-CM | POA: Diagnosis not present

## 2018-12-12 DIAGNOSIS — Z992 Dependence on renal dialysis: Secondary | ICD-10-CM | POA: Diagnosis not present

## 2018-12-12 DIAGNOSIS — D631 Anemia in chronic kidney disease: Secondary | ICD-10-CM | POA: Diagnosis not present

## 2018-12-12 DIAGNOSIS — D509 Iron deficiency anemia, unspecified: Secondary | ICD-10-CM | POA: Diagnosis not present

## 2018-12-12 DIAGNOSIS — N2581 Secondary hyperparathyroidism of renal origin: Secondary | ICD-10-CM | POA: Diagnosis not present

## 2018-12-15 DIAGNOSIS — Z992 Dependence on renal dialysis: Secondary | ICD-10-CM | POA: Diagnosis not present

## 2018-12-15 DIAGNOSIS — N2581 Secondary hyperparathyroidism of renal origin: Secondary | ICD-10-CM | POA: Diagnosis not present

## 2018-12-15 DIAGNOSIS — D631 Anemia in chronic kidney disease: Secondary | ICD-10-CM | POA: Diagnosis not present

## 2018-12-15 DIAGNOSIS — E1129 Type 2 diabetes mellitus with other diabetic kidney complication: Secondary | ICD-10-CM | POA: Diagnosis not present

## 2018-12-15 DIAGNOSIS — N186 End stage renal disease: Secondary | ICD-10-CM | POA: Diagnosis not present

## 2018-12-15 DIAGNOSIS — D509 Iron deficiency anemia, unspecified: Secondary | ICD-10-CM | POA: Diagnosis not present

## 2018-12-17 DIAGNOSIS — E1129 Type 2 diabetes mellitus with other diabetic kidney complication: Secondary | ICD-10-CM | POA: Diagnosis not present

## 2018-12-17 DIAGNOSIS — N2581 Secondary hyperparathyroidism of renal origin: Secondary | ICD-10-CM | POA: Diagnosis not present

## 2018-12-17 DIAGNOSIS — D509 Iron deficiency anemia, unspecified: Secondary | ICD-10-CM | POA: Diagnosis not present

## 2018-12-17 DIAGNOSIS — D631 Anemia in chronic kidney disease: Secondary | ICD-10-CM | POA: Diagnosis not present

## 2018-12-17 DIAGNOSIS — N186 End stage renal disease: Secondary | ICD-10-CM | POA: Diagnosis not present

## 2018-12-17 DIAGNOSIS — Z992 Dependence on renal dialysis: Secondary | ICD-10-CM | POA: Diagnosis not present

## 2018-12-19 DIAGNOSIS — N186 End stage renal disease: Secondary | ICD-10-CM | POA: Diagnosis not present

## 2018-12-19 DIAGNOSIS — D631 Anemia in chronic kidney disease: Secondary | ICD-10-CM | POA: Diagnosis not present

## 2018-12-19 DIAGNOSIS — E1129 Type 2 diabetes mellitus with other diabetic kidney complication: Secondary | ICD-10-CM | POA: Diagnosis not present

## 2018-12-19 DIAGNOSIS — Z992 Dependence on renal dialysis: Secondary | ICD-10-CM | POA: Diagnosis not present

## 2018-12-19 DIAGNOSIS — D509 Iron deficiency anemia, unspecified: Secondary | ICD-10-CM | POA: Diagnosis not present

## 2018-12-19 DIAGNOSIS — N2581 Secondary hyperparathyroidism of renal origin: Secondary | ICD-10-CM | POA: Diagnosis not present

## 2018-12-21 DIAGNOSIS — Z992 Dependence on renal dialysis: Secondary | ICD-10-CM | POA: Diagnosis not present

## 2018-12-21 DIAGNOSIS — D509 Iron deficiency anemia, unspecified: Secondary | ICD-10-CM | POA: Diagnosis not present

## 2018-12-21 DIAGNOSIS — N2581 Secondary hyperparathyroidism of renal origin: Secondary | ICD-10-CM | POA: Diagnosis not present

## 2018-12-21 DIAGNOSIS — E1129 Type 2 diabetes mellitus with other diabetic kidney complication: Secondary | ICD-10-CM | POA: Diagnosis not present

## 2018-12-21 DIAGNOSIS — D631 Anemia in chronic kidney disease: Secondary | ICD-10-CM | POA: Diagnosis not present

## 2018-12-21 DIAGNOSIS — N186 End stage renal disease: Secondary | ICD-10-CM | POA: Diagnosis not present

## 2018-12-23 DIAGNOSIS — Z992 Dependence on renal dialysis: Secondary | ICD-10-CM | POA: Diagnosis not present

## 2018-12-23 DIAGNOSIS — N186 End stage renal disease: Secondary | ICD-10-CM | POA: Diagnosis not present

## 2018-12-23 DIAGNOSIS — N2581 Secondary hyperparathyroidism of renal origin: Secondary | ICD-10-CM | POA: Diagnosis not present

## 2018-12-23 DIAGNOSIS — D509 Iron deficiency anemia, unspecified: Secondary | ICD-10-CM | POA: Diagnosis not present

## 2018-12-23 DIAGNOSIS — E1129 Type 2 diabetes mellitus with other diabetic kidney complication: Secondary | ICD-10-CM | POA: Diagnosis not present

## 2018-12-23 DIAGNOSIS — D631 Anemia in chronic kidney disease: Secondary | ICD-10-CM | POA: Diagnosis not present

## 2018-12-26 DIAGNOSIS — N186 End stage renal disease: Secondary | ICD-10-CM | POA: Diagnosis not present

## 2018-12-26 DIAGNOSIS — D509 Iron deficiency anemia, unspecified: Secondary | ICD-10-CM | POA: Diagnosis not present

## 2018-12-26 DIAGNOSIS — N2581 Secondary hyperparathyroidism of renal origin: Secondary | ICD-10-CM | POA: Diagnosis not present

## 2018-12-26 DIAGNOSIS — D631 Anemia in chronic kidney disease: Secondary | ICD-10-CM | POA: Diagnosis not present

## 2018-12-26 DIAGNOSIS — Z992 Dependence on renal dialysis: Secondary | ICD-10-CM | POA: Diagnosis not present

## 2018-12-26 DIAGNOSIS — E1129 Type 2 diabetes mellitus with other diabetic kidney complication: Secondary | ICD-10-CM | POA: Diagnosis not present

## 2018-12-28 DIAGNOSIS — D631 Anemia in chronic kidney disease: Secondary | ICD-10-CM | POA: Diagnosis not present

## 2018-12-28 DIAGNOSIS — E1129 Type 2 diabetes mellitus with other diabetic kidney complication: Secondary | ICD-10-CM | POA: Diagnosis not present

## 2018-12-28 DIAGNOSIS — Z992 Dependence on renal dialysis: Secondary | ICD-10-CM | POA: Diagnosis not present

## 2018-12-28 DIAGNOSIS — N186 End stage renal disease: Secondary | ICD-10-CM | POA: Diagnosis not present

## 2018-12-28 DIAGNOSIS — D509 Iron deficiency anemia, unspecified: Secondary | ICD-10-CM | POA: Diagnosis not present

## 2018-12-28 DIAGNOSIS — N2581 Secondary hyperparathyroidism of renal origin: Secondary | ICD-10-CM | POA: Diagnosis not present

## 2018-12-30 DIAGNOSIS — D509 Iron deficiency anemia, unspecified: Secondary | ICD-10-CM | POA: Diagnosis not present

## 2018-12-30 DIAGNOSIS — N2581 Secondary hyperparathyroidism of renal origin: Secondary | ICD-10-CM | POA: Diagnosis not present

## 2018-12-30 DIAGNOSIS — D631 Anemia in chronic kidney disease: Secondary | ICD-10-CM | POA: Diagnosis not present

## 2018-12-30 DIAGNOSIS — E1129 Type 2 diabetes mellitus with other diabetic kidney complication: Secondary | ICD-10-CM | POA: Diagnosis not present

## 2018-12-30 DIAGNOSIS — N186 End stage renal disease: Secondary | ICD-10-CM | POA: Diagnosis not present

## 2018-12-30 DIAGNOSIS — Z992 Dependence on renal dialysis: Secondary | ICD-10-CM | POA: Diagnosis not present

## 2018-12-31 DIAGNOSIS — I120 Hypertensive chronic kidney disease with stage 5 chronic kidney disease or end stage renal disease: Secondary | ICD-10-CM | POA: Diagnosis not present

## 2018-12-31 DIAGNOSIS — N186 End stage renal disease: Secondary | ICD-10-CM | POA: Diagnosis not present

## 2018-12-31 DIAGNOSIS — Z992 Dependence on renal dialysis: Secondary | ICD-10-CM | POA: Diagnosis not present

## 2019-01-02 DIAGNOSIS — D509 Iron deficiency anemia, unspecified: Secondary | ICD-10-CM | POA: Diagnosis not present

## 2019-01-02 DIAGNOSIS — E1129 Type 2 diabetes mellitus with other diabetic kidney complication: Secondary | ICD-10-CM | POA: Diagnosis not present

## 2019-01-02 DIAGNOSIS — D631 Anemia in chronic kidney disease: Secondary | ICD-10-CM | POA: Diagnosis not present

## 2019-01-02 DIAGNOSIS — Z992 Dependence on renal dialysis: Secondary | ICD-10-CM | POA: Diagnosis not present

## 2019-01-02 DIAGNOSIS — N186 End stage renal disease: Secondary | ICD-10-CM | POA: Diagnosis not present

## 2019-01-02 DIAGNOSIS — N2581 Secondary hyperparathyroidism of renal origin: Secondary | ICD-10-CM | POA: Diagnosis not present

## 2019-01-05 DIAGNOSIS — Z992 Dependence on renal dialysis: Secondary | ICD-10-CM | POA: Diagnosis not present

## 2019-01-05 DIAGNOSIS — D509 Iron deficiency anemia, unspecified: Secondary | ICD-10-CM | POA: Diagnosis not present

## 2019-01-05 DIAGNOSIS — N186 End stage renal disease: Secondary | ICD-10-CM | POA: Diagnosis not present

## 2019-01-05 DIAGNOSIS — N2581 Secondary hyperparathyroidism of renal origin: Secondary | ICD-10-CM | POA: Diagnosis not present

## 2019-01-05 DIAGNOSIS — D631 Anemia in chronic kidney disease: Secondary | ICD-10-CM | POA: Diagnosis not present

## 2019-01-05 DIAGNOSIS — E1129 Type 2 diabetes mellitus with other diabetic kidney complication: Secondary | ICD-10-CM | POA: Diagnosis not present

## 2019-01-07 DIAGNOSIS — D631 Anemia in chronic kidney disease: Secondary | ICD-10-CM | POA: Diagnosis not present

## 2019-01-07 DIAGNOSIS — N2581 Secondary hyperparathyroidism of renal origin: Secondary | ICD-10-CM | POA: Diagnosis not present

## 2019-01-07 DIAGNOSIS — E1129 Type 2 diabetes mellitus with other diabetic kidney complication: Secondary | ICD-10-CM | POA: Diagnosis not present

## 2019-01-07 DIAGNOSIS — N186 End stage renal disease: Secondary | ICD-10-CM | POA: Diagnosis not present

## 2019-01-07 DIAGNOSIS — Z992 Dependence on renal dialysis: Secondary | ICD-10-CM | POA: Diagnosis not present

## 2019-01-07 DIAGNOSIS — D509 Iron deficiency anemia, unspecified: Secondary | ICD-10-CM | POA: Diagnosis not present

## 2019-01-09 DIAGNOSIS — D509 Iron deficiency anemia, unspecified: Secondary | ICD-10-CM | POA: Diagnosis not present

## 2019-01-09 DIAGNOSIS — N2581 Secondary hyperparathyroidism of renal origin: Secondary | ICD-10-CM | POA: Diagnosis not present

## 2019-01-09 DIAGNOSIS — N186 End stage renal disease: Secondary | ICD-10-CM | POA: Diagnosis not present

## 2019-01-09 DIAGNOSIS — Z992 Dependence on renal dialysis: Secondary | ICD-10-CM | POA: Diagnosis not present

## 2019-01-09 DIAGNOSIS — E1129 Type 2 diabetes mellitus with other diabetic kidney complication: Secondary | ICD-10-CM | POA: Diagnosis not present

## 2019-01-09 DIAGNOSIS — D631 Anemia in chronic kidney disease: Secondary | ICD-10-CM | POA: Diagnosis not present

## 2019-01-12 DIAGNOSIS — D509 Iron deficiency anemia, unspecified: Secondary | ICD-10-CM | POA: Diagnosis not present

## 2019-01-12 DIAGNOSIS — D631 Anemia in chronic kidney disease: Secondary | ICD-10-CM | POA: Diagnosis not present

## 2019-01-12 DIAGNOSIS — N186 End stage renal disease: Secondary | ICD-10-CM | POA: Diagnosis not present

## 2019-01-12 DIAGNOSIS — N2581 Secondary hyperparathyroidism of renal origin: Secondary | ICD-10-CM | POA: Diagnosis not present

## 2019-01-12 DIAGNOSIS — Z992 Dependence on renal dialysis: Secondary | ICD-10-CM | POA: Diagnosis not present

## 2019-01-12 DIAGNOSIS — E1129 Type 2 diabetes mellitus with other diabetic kidney complication: Secondary | ICD-10-CM | POA: Diagnosis not present

## 2019-01-14 DIAGNOSIS — E1129 Type 2 diabetes mellitus with other diabetic kidney complication: Secondary | ICD-10-CM | POA: Diagnosis not present

## 2019-01-14 DIAGNOSIS — N186 End stage renal disease: Secondary | ICD-10-CM | POA: Diagnosis not present

## 2019-01-14 DIAGNOSIS — D509 Iron deficiency anemia, unspecified: Secondary | ICD-10-CM | POA: Diagnosis not present

## 2019-01-14 DIAGNOSIS — D631 Anemia in chronic kidney disease: Secondary | ICD-10-CM | POA: Diagnosis not present

## 2019-01-14 DIAGNOSIS — N2581 Secondary hyperparathyroidism of renal origin: Secondary | ICD-10-CM | POA: Diagnosis not present

## 2019-01-14 DIAGNOSIS — Z992 Dependence on renal dialysis: Secondary | ICD-10-CM | POA: Diagnosis not present

## 2019-01-16 DIAGNOSIS — N2581 Secondary hyperparathyroidism of renal origin: Secondary | ICD-10-CM | POA: Diagnosis not present

## 2019-01-16 DIAGNOSIS — D509 Iron deficiency anemia, unspecified: Secondary | ICD-10-CM | POA: Diagnosis not present

## 2019-01-16 DIAGNOSIS — Z992 Dependence on renal dialysis: Secondary | ICD-10-CM | POA: Diagnosis not present

## 2019-01-16 DIAGNOSIS — N186 End stage renal disease: Secondary | ICD-10-CM | POA: Diagnosis not present

## 2019-01-16 DIAGNOSIS — E1129 Type 2 diabetes mellitus with other diabetic kidney complication: Secondary | ICD-10-CM | POA: Diagnosis not present

## 2019-01-16 DIAGNOSIS — D631 Anemia in chronic kidney disease: Secondary | ICD-10-CM | POA: Diagnosis not present

## 2019-01-19 DIAGNOSIS — E1129 Type 2 diabetes mellitus with other diabetic kidney complication: Secondary | ICD-10-CM | POA: Diagnosis not present

## 2019-01-19 DIAGNOSIS — N2581 Secondary hyperparathyroidism of renal origin: Secondary | ICD-10-CM | POA: Diagnosis not present

## 2019-01-19 DIAGNOSIS — N186 End stage renal disease: Secondary | ICD-10-CM | POA: Diagnosis not present

## 2019-01-19 DIAGNOSIS — Z992 Dependence on renal dialysis: Secondary | ICD-10-CM | POA: Diagnosis not present

## 2019-01-19 DIAGNOSIS — D631 Anemia in chronic kidney disease: Secondary | ICD-10-CM | POA: Diagnosis not present

## 2019-01-19 DIAGNOSIS — D509 Iron deficiency anemia, unspecified: Secondary | ICD-10-CM | POA: Diagnosis not present

## 2019-01-21 DIAGNOSIS — D509 Iron deficiency anemia, unspecified: Secondary | ICD-10-CM | POA: Diagnosis not present

## 2019-01-21 DIAGNOSIS — Z992 Dependence on renal dialysis: Secondary | ICD-10-CM | POA: Diagnosis not present

## 2019-01-21 DIAGNOSIS — N186 End stage renal disease: Secondary | ICD-10-CM | POA: Diagnosis not present

## 2019-01-21 DIAGNOSIS — N2581 Secondary hyperparathyroidism of renal origin: Secondary | ICD-10-CM | POA: Diagnosis not present

## 2019-01-21 DIAGNOSIS — E1129 Type 2 diabetes mellitus with other diabetic kidney complication: Secondary | ICD-10-CM | POA: Diagnosis not present

## 2019-01-21 DIAGNOSIS — D631 Anemia in chronic kidney disease: Secondary | ICD-10-CM | POA: Diagnosis not present

## 2019-01-23 DIAGNOSIS — D631 Anemia in chronic kidney disease: Secondary | ICD-10-CM | POA: Diagnosis not present

## 2019-01-23 DIAGNOSIS — N186 End stage renal disease: Secondary | ICD-10-CM | POA: Diagnosis not present

## 2019-01-23 DIAGNOSIS — D509 Iron deficiency anemia, unspecified: Secondary | ICD-10-CM | POA: Diagnosis not present

## 2019-01-23 DIAGNOSIS — N2581 Secondary hyperparathyroidism of renal origin: Secondary | ICD-10-CM | POA: Diagnosis not present

## 2019-01-23 DIAGNOSIS — E1129 Type 2 diabetes mellitus with other diabetic kidney complication: Secondary | ICD-10-CM | POA: Diagnosis not present

## 2019-01-23 DIAGNOSIS — Z992 Dependence on renal dialysis: Secondary | ICD-10-CM | POA: Diagnosis not present

## 2019-01-26 DIAGNOSIS — N2581 Secondary hyperparathyroidism of renal origin: Secondary | ICD-10-CM | POA: Diagnosis not present

## 2019-01-26 DIAGNOSIS — N186 End stage renal disease: Secondary | ICD-10-CM | POA: Diagnosis not present

## 2019-01-26 DIAGNOSIS — D509 Iron deficiency anemia, unspecified: Secondary | ICD-10-CM | POA: Diagnosis not present

## 2019-01-26 DIAGNOSIS — E1129 Type 2 diabetes mellitus with other diabetic kidney complication: Secondary | ICD-10-CM | POA: Diagnosis not present

## 2019-01-26 DIAGNOSIS — D631 Anemia in chronic kidney disease: Secondary | ICD-10-CM | POA: Diagnosis not present

## 2019-01-26 DIAGNOSIS — Z992 Dependence on renal dialysis: Secondary | ICD-10-CM | POA: Diagnosis not present

## 2019-01-27 DIAGNOSIS — H40013 Open angle with borderline findings, low risk, bilateral: Secondary | ICD-10-CM | POA: Diagnosis not present

## 2019-01-27 DIAGNOSIS — H26492 Other secondary cataract, left eye: Secondary | ICD-10-CM | POA: Diagnosis not present

## 2019-01-27 DIAGNOSIS — H52203 Unspecified astigmatism, bilateral: Secondary | ICD-10-CM | POA: Diagnosis not present

## 2019-01-27 DIAGNOSIS — E113393 Type 2 diabetes mellitus with moderate nonproliferative diabetic retinopathy without macular edema, bilateral: Secondary | ICD-10-CM | POA: Diagnosis not present

## 2019-01-27 LAB — HM DIABETES EYE EXAM

## 2019-01-28 DIAGNOSIS — D631 Anemia in chronic kidney disease: Secondary | ICD-10-CM | POA: Diagnosis not present

## 2019-01-28 DIAGNOSIS — Z992 Dependence on renal dialysis: Secondary | ICD-10-CM | POA: Diagnosis not present

## 2019-01-28 DIAGNOSIS — E1129 Type 2 diabetes mellitus with other diabetic kidney complication: Secondary | ICD-10-CM | POA: Diagnosis not present

## 2019-01-28 DIAGNOSIS — D509 Iron deficiency anemia, unspecified: Secondary | ICD-10-CM | POA: Diagnosis not present

## 2019-01-28 DIAGNOSIS — N186 End stage renal disease: Secondary | ICD-10-CM | POA: Diagnosis not present

## 2019-01-28 DIAGNOSIS — N2581 Secondary hyperparathyroidism of renal origin: Secondary | ICD-10-CM | POA: Diagnosis not present

## 2019-01-30 DIAGNOSIS — E1129 Type 2 diabetes mellitus with other diabetic kidney complication: Secondary | ICD-10-CM | POA: Diagnosis not present

## 2019-01-30 DIAGNOSIS — D509 Iron deficiency anemia, unspecified: Secondary | ICD-10-CM | POA: Diagnosis not present

## 2019-01-30 DIAGNOSIS — N2581 Secondary hyperparathyroidism of renal origin: Secondary | ICD-10-CM | POA: Diagnosis not present

## 2019-01-30 DIAGNOSIS — D631 Anemia in chronic kidney disease: Secondary | ICD-10-CM | POA: Diagnosis not present

## 2019-01-30 DIAGNOSIS — Z992 Dependence on renal dialysis: Secondary | ICD-10-CM | POA: Diagnosis not present

## 2019-01-30 DIAGNOSIS — N186 End stage renal disease: Secondary | ICD-10-CM | POA: Diagnosis not present

## 2019-01-31 DIAGNOSIS — Z992 Dependence on renal dialysis: Secondary | ICD-10-CM | POA: Diagnosis not present

## 2019-01-31 DIAGNOSIS — N186 End stage renal disease: Secondary | ICD-10-CM | POA: Diagnosis not present

## 2019-01-31 DIAGNOSIS — I120 Hypertensive chronic kidney disease with stage 5 chronic kidney disease or end stage renal disease: Secondary | ICD-10-CM | POA: Diagnosis not present

## 2019-02-02 DIAGNOSIS — D509 Iron deficiency anemia, unspecified: Secondary | ICD-10-CM | POA: Diagnosis not present

## 2019-02-02 DIAGNOSIS — E1129 Type 2 diabetes mellitus with other diabetic kidney complication: Secondary | ICD-10-CM | POA: Diagnosis not present

## 2019-02-02 DIAGNOSIS — Z992 Dependence on renal dialysis: Secondary | ICD-10-CM | POA: Diagnosis not present

## 2019-02-02 DIAGNOSIS — N186 End stage renal disease: Secondary | ICD-10-CM | POA: Diagnosis not present

## 2019-02-02 DIAGNOSIS — D631 Anemia in chronic kidney disease: Secondary | ICD-10-CM | POA: Diagnosis not present

## 2019-02-02 DIAGNOSIS — N2581 Secondary hyperparathyroidism of renal origin: Secondary | ICD-10-CM | POA: Diagnosis not present

## 2019-02-03 ENCOUNTER — Encounter: Payer: Self-pay | Admitting: Internal Medicine

## 2019-02-03 ENCOUNTER — Ambulatory Visit (INDEPENDENT_AMBULATORY_CARE_PROVIDER_SITE_OTHER): Payer: Medicare Other | Admitting: Internal Medicine

## 2019-02-03 VITALS — BP 180/90 | HR 78 | Temp 98.4°F | Wt 125.0 lb

## 2019-02-03 DIAGNOSIS — H6123 Impacted cerumen, bilateral: Secondary | ICD-10-CM | POA: Diagnosis not present

## 2019-02-03 NOTE — Progress Notes (Signed)
Established Patient Office Visit     CC/Reason for Visit: Decreased hearing, sensation of fullness left ear, "I need to get my ears cleaned out"  HPI: [name] is a 85 y.o. female who is coming in today for the above mentioned reasons. Comes in with her daughter today. Daughter states she needs to get her ears cleaned out about every 3 years. Has been having mild hearing loss and a sensation of left ear fullness for about a week. No pain or recent URI symptoms.   Past Medical/Surgical History: Past Medical History:  Diagnosis Date  . Arthritis    "all over" (03/09/2014)  . Asthma    "used to; not anymore" (03/09/2014)  . Blood transfusion 1960's  . Chronic anemia   . Chronic back pain   . ESRD (end stage renal disease) on dialysis ([place])    "[address]  (04/10/2017)  . Gangrene ([place])    left fifth toe  . GERD (gastroesophageal reflux disease)    hx (03/09/2014)  . Gout, unspecified 1980's   "not anymore" (03/09/2014)  . Hyperlipidemia   . Hypertension   . Iliopsoas muscle hematoma 04/10/2017  . Mild pulmonary hypertension ([place])   . PAD (peripheral artery disease) ([place])    a. amputation of L toe 2013, with left external iliac artery to tibioperoneal trunk bypass graft with endarterectomy if the tibioperoneal trunk and anterior tibial artery origin in October 2013.  Marland Kitchen Pneumonia    "once; years ago" (03/09/2014)  . Shoulder fracture, right 2012  . Type II diabetes mellitus ([place])    "not on any medication at this time" (03/09/2014)    Past Surgical History:  Procedure Laterality Date  . APPENDECTOMY  1960's  . ARTERIOVENOUS GRAFT PLACEMENT Left    femoral loop arteriovenous Gore-Tex graft.  . AV FISTULA PLACEMENT  2008- 2013   "left upper arm; twice in my neck; left leg; removed from left leg; right upper arm" (11/25/2012)  . AV FISTULA PLACEMENT Right 03/09/2014   Procedure: RIGHT AXILLARY EXPLORATION; PARTIAL REMOVOAL OF OLD ARTERIOVENOUS (AV)  GORE-TEX GRAFT; LIGATION OF RIGHT AXILLARY VEIN; ULTRASOUND GUIDED;  Surgeon: Conrad [place], MD;  Location: [facility];  Service: Vascular;  Laterality: Right;  . AV FISTULA REPAIR Bilateral    "right/left arm fistula failed; removed left thigh d/t poor circulation" (11/25/2012)  . CATARACT EXTRACTION, BILATERAL Bilateral   . COLONOSCOPY  2014?  . ESOPHAGOGASTRODUODENOSCOPY N/A 09/10/2013   Procedure: ESOPHAGOGASTRODUODENOSCOPY (EGD);  Surgeon: [name], MD;  [name];  Service: Endoscopy;  Laterality: N/A;  . EXCHANGE OF A DIALYSIS CATHETER Right 06/11/2013   Procedure: EXCHANGE OF A DIALYSIS CATHETER;  Surgeon: Conrad Coffey, MD;  Location: [facility];  Service: Vascular;  Laterality: Right;  . EYE SURGERY    . FEMORAL-POPLITEAL BYPASS GRAFT  04/11/2012   Procedure: BYPASS GRAFT FEMORAL-POPLITEAL ARTERY;  Surgeon: [name], MD;  Location: [facility];  Service: Vascular;  Laterality: Left;  Thrombectomy/Left femoral-popliteal bypass with revision of proximal end and shortening of graft; intraoperative arteriogram; endarterectomy and patch angioplasty with distal anastomosis  . FEMORAL-POPLITEAL BYPASS GRAFT  10/09/2012   Procedure: BYPASS GRAFT FEMORAL-POPLITEAL ARTERY;  Surgeon: [name], MD;  Location: [place];  Service: Vascular;  Laterality: Left;  Redo left tibioperoneal trunk bypass with Gortex Graft 98mmx80cm.  Marland Kitchen FISTULOGRAM Right 06/11/2013   Procedure: Venogram with angioplasty;  Surgeon: Conrad Ipava, MD;  Location: [facility];  Service: Vascular;  Laterality: Right;  RIGHT CENTRAL VENOGRAM WITH ANGIOPLASTY  . FOOT AMPUTATION THROUGH METATARSAL Left 06/22/11   "whole 5th toe" (11/25/2012)  . INSERTION OF DIALYSIS CATHETER Right 03/10/2014   Procedure: INSERTION OF TUNNELED  DIALYSIS CATHETER -attempted  RIGHT SUBCLAVIAN, RIGHT FEMORAL TUNNELED DIALYSIS CATHETER EXCHANGE with Inferior Vena Cava gram and Fibrin Sheath Angioplasty;  Surgeon: Conrad [place]ona, MD;  Location: [facility];   Service: Vascular;  Laterality: Right;  . KYPHOPLASTY  11/25/2012   Procedure: KYPHOPLASTY;  Surgeon: Kristeen Miss, MD;  Location: [facility];  Service: Neurosurgery;  Laterality: N/A;  Lumbar two lumbar five Kyphoplasty  . PR VEIN BYPASS GRAFT,AORTO-FEM-POP  06/14/2011  . TOTAL ABDOMINAL HYSTERECTOMY  1960's   one ovary removed    Social History:  reports that she has quit smoking. Her smoking use included cigarettes. She has a 1.80 pack-year smoking history. She has never used smokeless tobacco. She reports that she does not drink alcohol or use drugs.  Allergies: Allergies  Allergen Reactions  . Ace Inhibitors Other (See Comments)    Reaction unknown    Family History:  Family History  Problem Relation Age of Onset  . Peripheral vascular disease Mother        amputation  . Hypertension Mother   . Alcohol abuse Mother   . Arthritis Mother   . COPD Sister   . Heart attack Sister      Current Outpatient Medications:  .  acetaminophen (TYLENOL) 500 MG tablet, Take 500 mg by mouth every 6 (six) hours as needed for moderate pain., Disp: , Rfl:  .  amLODipine (NORVASC) 2.5 MG tablet, TAKE 1 TABLET (2.5 MG TOTAL) BY MOUTH DAILY., Disp: 30 tablet, Rfl: 2 .  aspirin 81 MG chewable tablet, Chew 81 mg by mouth daily., Disp: , Rfl:  .  cloNIDine (CATAPRES) 0.1 MG tablet, Take 1 tablet (0.1 mg total) by mouth daily. APPOINTMENT IS NEEDED FOR FURTHER REFILLS., Disp: 8 tablet, Rfl: 0 .  latanoprost (XALATAN) 0.005 % ophthalmic solution, 1 drop at bedtime., Disp: , Rfl:  .  ondansetron (ZOFRAN) 4 MG tablet, Take 1 tablet (4 mg total) by mouth every 6 (six) hours as needed for nausea., Disp: 20 tablet, Rfl: 0 .  pantoprazole (PROTONIX) 40 MG tablet, Take 40 mg by mouth at bedtime as needed (occasional). , Disp: , Rfl:  .  sevelamer carbonate (RENVELA) 800 MG tablet, Take 800 mg by mouth as directed., Disp: , Rfl:   Review of Systems:  Constitutional: Denies fever, chills, diaphoresis,  appetite change and fatigue.  HEENT: Denies photophobia, eye pain, redness, hearing loss, ear pain, congestion, sore throat, rhinorrhea, sneezing, mouth sores, trouble swallowing, neck pain, neck stiffness and tinnitus.   Respiratory: Denies SOB, DOE, cough, chest tightness,  and wheezing.   Cardiovascular: Denies chest pain, palpitations and leg swelling.  Gastrointestinal: Denies nausea, vomiting, abdominal pain, diarrhea, constipation, blood in stool and abdominal distention.  Genitourinary: Denies dysuria, urgency, frequency, hematuria, flank pain and difficulty urinating.  Endocrine: Denies: hot or cold intolerance, sweats, changes in hair or nails, polyuria, polydipsia. Musculoskeletal: Denies myalgias, back pain, joint swelling, arthralgias and gait problem.  Skin: Denies pallor, rash and wound.  Neurological: Denies dizziness, seizures, syncope, weakness, light-headedness, numbness and headaches.  Hematological: Denies adenopathy. Easy bruising, personal or family bleeding history  Psychiatric/Behavioral: Denies suicidal ideation, mood changes, confusion, nervousness, sleep disturbance and agitation    Physical Exam: Vitals:   02/03/19 1331  BP: (!) 180/90  Pulse: 78  Temp:  98.4 F (36.9 C)  TempSrc: Oral  SpO2: 99%  Weight: 125 lb (56.7 kg)    Body mass index is 25.25 kg/m.   Constitutional: NAD, calm, comfortable Eyes: PERRL, lids and conjunctivae normal ENMT: Mucous membranes are moist. Posterior pharynx clear of any exudate or lesions. Normal dentition. Tympanic membrane is obscured by cerumen bilaterally.  Psychiatric: Normal judgment and insight. Alert and oriented x 3. Normal mood.    Impression and Plan:  Bilateral hearing loss due to cerumen impaction -Both ears have been irrigated today without apparent complications. -Hearing is improved and subsequent examination of TM appears normal.  F/U with PCP as scheduled.    Patient Instructions  -It was nice  meeting you today!  -Your ears have been irrigated today.  -Follow up with [facility] as scheduled.     Lelon Frohlich, MD [facility]

## 2019-02-03 NOTE — Patient Instructions (Signed)
-  It was nice meeting you today!  -Your ears have been irrigated today.  -Follow up with [facility] as scheduled.

## 2019-02-04 DIAGNOSIS — D631 Anemia in chronic kidney disease: Secondary | ICD-10-CM | POA: Diagnosis not present

## 2019-02-04 DIAGNOSIS — D509 Iron deficiency anemia, unspecified: Secondary | ICD-10-CM | POA: Diagnosis not present

## 2019-02-04 DIAGNOSIS — E1129 Type 2 diabetes mellitus with other diabetic kidney complication: Secondary | ICD-10-CM | POA: Diagnosis not present

## 2019-02-04 DIAGNOSIS — N186 End stage renal disease: Secondary | ICD-10-CM | POA: Diagnosis not present

## 2019-02-04 DIAGNOSIS — N2581 Secondary hyperparathyroidism of renal origin: Secondary | ICD-10-CM | POA: Diagnosis not present

## 2019-02-04 DIAGNOSIS — Z992 Dependence on renal dialysis: Secondary | ICD-10-CM | POA: Diagnosis not present

## 2019-02-06 DIAGNOSIS — Z992 Dependence on renal dialysis: Secondary | ICD-10-CM | POA: Diagnosis not present

## 2019-02-06 DIAGNOSIS — D631 Anemia in chronic kidney disease: Secondary | ICD-10-CM | POA: Diagnosis not present

## 2019-02-06 DIAGNOSIS — D509 Iron deficiency anemia, unspecified: Secondary | ICD-10-CM | POA: Diagnosis not present

## 2019-02-06 DIAGNOSIS — N186 End stage renal disease: Secondary | ICD-10-CM | POA: Diagnosis not present

## 2019-02-06 DIAGNOSIS — E1129 Type 2 diabetes mellitus with other diabetic kidney complication: Secondary | ICD-10-CM | POA: Diagnosis not present

## 2019-02-06 DIAGNOSIS — N2581 Secondary hyperparathyroidism of renal origin: Secondary | ICD-10-CM | POA: Diagnosis not present

## 2019-02-09 DIAGNOSIS — D631 Anemia in chronic kidney disease: Secondary | ICD-10-CM | POA: Diagnosis not present

## 2019-02-09 DIAGNOSIS — Z992 Dependence on renal dialysis: Secondary | ICD-10-CM | POA: Diagnosis not present

## 2019-02-09 DIAGNOSIS — N186 End stage renal disease: Secondary | ICD-10-CM | POA: Diagnosis not present

## 2019-02-09 DIAGNOSIS — D509 Iron deficiency anemia, unspecified: Secondary | ICD-10-CM | POA: Diagnosis not present

## 2019-02-09 DIAGNOSIS — N2581 Secondary hyperparathyroidism of renal origin: Secondary | ICD-10-CM | POA: Diagnosis not present

## 2019-02-09 DIAGNOSIS — E1129 Type 2 diabetes mellitus with other diabetic kidney complication: Secondary | ICD-10-CM | POA: Diagnosis not present

## 2019-02-11 DIAGNOSIS — D631 Anemia in chronic kidney disease: Secondary | ICD-10-CM | POA: Diagnosis not present

## 2019-02-11 DIAGNOSIS — E1129 Type 2 diabetes mellitus with other diabetic kidney complication: Secondary | ICD-10-CM | POA: Diagnosis not present

## 2019-02-11 DIAGNOSIS — D509 Iron deficiency anemia, unspecified: Secondary | ICD-10-CM | POA: Diagnosis not present

## 2019-02-11 DIAGNOSIS — N2581 Secondary hyperparathyroidism of renal origin: Secondary | ICD-10-CM | POA: Diagnosis not present

## 2019-02-11 DIAGNOSIS — Z992 Dependence on renal dialysis: Secondary | ICD-10-CM | POA: Diagnosis not present

## 2019-02-11 DIAGNOSIS — N186 End stage renal disease: Secondary | ICD-10-CM | POA: Diagnosis not present

## 2019-02-13 DIAGNOSIS — N186 End stage renal disease: Secondary | ICD-10-CM | POA: Diagnosis not present

## 2019-02-13 DIAGNOSIS — Z992 Dependence on renal dialysis: Secondary | ICD-10-CM | POA: Diagnosis not present

## 2019-02-13 DIAGNOSIS — D631 Anemia in chronic kidney disease: Secondary | ICD-10-CM | POA: Diagnosis not present

## 2019-02-13 DIAGNOSIS — D509 Iron deficiency anemia, unspecified: Secondary | ICD-10-CM | POA: Diagnosis not present

## 2019-02-13 DIAGNOSIS — E1129 Type 2 diabetes mellitus with other diabetic kidney complication: Secondary | ICD-10-CM | POA: Diagnosis not present

## 2019-02-13 DIAGNOSIS — N2581 Secondary hyperparathyroidism of renal origin: Secondary | ICD-10-CM | POA: Diagnosis not present

## 2019-02-16 DIAGNOSIS — D509 Iron deficiency anemia, unspecified: Secondary | ICD-10-CM | POA: Diagnosis not present

## 2019-02-16 DIAGNOSIS — E1129 Type 2 diabetes mellitus with other diabetic kidney complication: Secondary | ICD-10-CM | POA: Diagnosis not present

## 2019-02-16 DIAGNOSIS — N2581 Secondary hyperparathyroidism of renal origin: Secondary | ICD-10-CM | POA: Diagnosis not present

## 2019-02-16 DIAGNOSIS — D631 Anemia in chronic kidney disease: Secondary | ICD-10-CM | POA: Diagnosis not present

## 2019-02-16 DIAGNOSIS — N186 End stage renal disease: Secondary | ICD-10-CM | POA: Diagnosis not present

## 2019-02-16 DIAGNOSIS — Z992 Dependence on renal dialysis: Secondary | ICD-10-CM | POA: Diagnosis not present

## 2019-02-18 DIAGNOSIS — D631 Anemia in chronic kidney disease: Secondary | ICD-10-CM | POA: Diagnosis not present

## 2019-02-18 DIAGNOSIS — D509 Iron deficiency anemia, unspecified: Secondary | ICD-10-CM | POA: Diagnosis not present

## 2019-02-18 DIAGNOSIS — N186 End stage renal disease: Secondary | ICD-10-CM | POA: Diagnosis not present

## 2019-02-18 DIAGNOSIS — E1129 Type 2 diabetes mellitus with other diabetic kidney complication: Secondary | ICD-10-CM | POA: Diagnosis not present

## 2019-02-18 DIAGNOSIS — N2581 Secondary hyperparathyroidism of renal origin: Secondary | ICD-10-CM | POA: Diagnosis not present

## 2019-02-18 DIAGNOSIS — Z992 Dependence on renal dialysis: Secondary | ICD-10-CM | POA: Diagnosis not present

## 2019-02-23 DIAGNOSIS — D509 Iron deficiency anemia, unspecified: Secondary | ICD-10-CM | POA: Diagnosis not present

## 2019-02-23 DIAGNOSIS — D631 Anemia in chronic kidney disease: Secondary | ICD-10-CM | POA: Diagnosis not present

## 2019-02-23 DIAGNOSIS — N2581 Secondary hyperparathyroidism of renal origin: Secondary | ICD-10-CM | POA: Diagnosis not present

## 2019-02-23 DIAGNOSIS — E1129 Type 2 diabetes mellitus with other diabetic kidney complication: Secondary | ICD-10-CM | POA: Diagnosis not present

## 2019-02-23 DIAGNOSIS — Z992 Dependence on renal dialysis: Secondary | ICD-10-CM | POA: Diagnosis not present

## 2019-02-23 DIAGNOSIS — N186 End stage renal disease: Secondary | ICD-10-CM | POA: Diagnosis not present

## 2019-02-25 DIAGNOSIS — N2581 Secondary hyperparathyroidism of renal origin: Secondary | ICD-10-CM | POA: Diagnosis not present

## 2019-02-25 DIAGNOSIS — D631 Anemia in chronic kidney disease: Secondary | ICD-10-CM | POA: Diagnosis not present

## 2019-02-25 DIAGNOSIS — Z992 Dependence on renal dialysis: Secondary | ICD-10-CM | POA: Diagnosis not present

## 2019-02-25 DIAGNOSIS — N186 End stage renal disease: Secondary | ICD-10-CM | POA: Diagnosis not present

## 2019-02-25 DIAGNOSIS — D509 Iron deficiency anemia, unspecified: Secondary | ICD-10-CM | POA: Diagnosis not present

## 2019-02-25 DIAGNOSIS — E1129 Type 2 diabetes mellitus with other diabetic kidney complication: Secondary | ICD-10-CM | POA: Diagnosis not present

## 2019-02-27 DIAGNOSIS — Z992 Dependence on renal dialysis: Secondary | ICD-10-CM | POA: Diagnosis not present

## 2019-02-27 DIAGNOSIS — E1129 Type 2 diabetes mellitus with other diabetic kidney complication: Secondary | ICD-10-CM | POA: Diagnosis not present

## 2019-02-27 DIAGNOSIS — N2581 Secondary hyperparathyroidism of renal origin: Secondary | ICD-10-CM | POA: Diagnosis not present

## 2019-02-27 DIAGNOSIS — D509 Iron deficiency anemia, unspecified: Secondary | ICD-10-CM | POA: Diagnosis not present

## 2019-02-27 DIAGNOSIS — N186 End stage renal disease: Secondary | ICD-10-CM | POA: Diagnosis not present

## 2019-02-27 DIAGNOSIS — D631 Anemia in chronic kidney disease: Secondary | ICD-10-CM | POA: Diagnosis not present

## 2019-03-01 DIAGNOSIS — I120 Hypertensive chronic kidney disease with stage 5 chronic kidney disease or end stage renal disease: Secondary | ICD-10-CM | POA: Diagnosis not present

## 2019-03-01 DIAGNOSIS — N186 End stage renal disease: Secondary | ICD-10-CM | POA: Diagnosis not present

## 2019-03-01 DIAGNOSIS — Z992 Dependence on renal dialysis: Secondary | ICD-10-CM | POA: Diagnosis not present

## 2019-03-02 DIAGNOSIS — D631 Anemia in chronic kidney disease: Secondary | ICD-10-CM | POA: Diagnosis not present

## 2019-03-02 DIAGNOSIS — D509 Iron deficiency anemia, unspecified: Secondary | ICD-10-CM | POA: Diagnosis not present

## 2019-03-02 DIAGNOSIS — N186 End stage renal disease: Secondary | ICD-10-CM | POA: Diagnosis not present

## 2019-03-02 DIAGNOSIS — Z992 Dependence on renal dialysis: Secondary | ICD-10-CM | POA: Diagnosis not present

## 2019-03-02 DIAGNOSIS — E1129 Type 2 diabetes mellitus with other diabetic kidney complication: Secondary | ICD-10-CM | POA: Diagnosis not present

## 2019-03-02 DIAGNOSIS — N2581 Secondary hyperparathyroidism of renal origin: Secondary | ICD-10-CM | POA: Diagnosis not present

## 2019-03-04 DIAGNOSIS — N186 End stage renal disease: Secondary | ICD-10-CM | POA: Diagnosis not present

## 2019-03-04 DIAGNOSIS — D631 Anemia in chronic kidney disease: Secondary | ICD-10-CM | POA: Diagnosis not present

## 2019-03-04 DIAGNOSIS — D509 Iron deficiency anemia, unspecified: Secondary | ICD-10-CM | POA: Diagnosis not present

## 2019-03-04 DIAGNOSIS — N2581 Secondary hyperparathyroidism of renal origin: Secondary | ICD-10-CM | POA: Diagnosis not present

## 2019-03-04 DIAGNOSIS — Z992 Dependence on renal dialysis: Secondary | ICD-10-CM | POA: Diagnosis not present

## 2019-03-04 DIAGNOSIS — E1129 Type 2 diabetes mellitus with other diabetic kidney complication: Secondary | ICD-10-CM | POA: Diagnosis not present

## 2019-03-06 ENCOUNTER — Encounter: Payer: Self-pay | Admitting: Family Medicine

## 2019-03-06 DIAGNOSIS — E1129 Type 2 diabetes mellitus with other diabetic kidney complication: Secondary | ICD-10-CM | POA: Diagnosis not present

## 2019-03-06 DIAGNOSIS — D509 Iron deficiency anemia, unspecified: Secondary | ICD-10-CM | POA: Diagnosis not present

## 2019-03-06 DIAGNOSIS — D631 Anemia in chronic kidney disease: Secondary | ICD-10-CM | POA: Diagnosis not present

## 2019-03-06 DIAGNOSIS — N2581 Secondary hyperparathyroidism of renal origin: Secondary | ICD-10-CM | POA: Diagnosis not present

## 2019-03-06 DIAGNOSIS — N186 End stage renal disease: Secondary | ICD-10-CM | POA: Diagnosis not present

## 2019-03-06 DIAGNOSIS — Z992 Dependence on renal dialysis: Secondary | ICD-10-CM | POA: Diagnosis not present

## 2019-03-09 DIAGNOSIS — N186 End stage renal disease: Secondary | ICD-10-CM | POA: Diagnosis not present

## 2019-03-09 DIAGNOSIS — N2581 Secondary hyperparathyroidism of renal origin: Secondary | ICD-10-CM | POA: Diagnosis not present

## 2019-03-09 DIAGNOSIS — E1129 Type 2 diabetes mellitus with other diabetic kidney complication: Secondary | ICD-10-CM | POA: Diagnosis not present

## 2019-03-09 DIAGNOSIS — D631 Anemia in chronic kidney disease: Secondary | ICD-10-CM | POA: Diagnosis not present

## 2019-03-09 DIAGNOSIS — D509 Iron deficiency anemia, unspecified: Secondary | ICD-10-CM | POA: Diagnosis not present

## 2019-03-09 DIAGNOSIS — Z992 Dependence on renal dialysis: Secondary | ICD-10-CM | POA: Diagnosis not present

## 2019-03-11 DIAGNOSIS — D631 Anemia in chronic kidney disease: Secondary | ICD-10-CM | POA: Diagnosis not present

## 2019-03-11 DIAGNOSIS — E1129 Type 2 diabetes mellitus with other diabetic kidney complication: Secondary | ICD-10-CM | POA: Diagnosis not present

## 2019-03-11 DIAGNOSIS — Z992 Dependence on renal dialysis: Secondary | ICD-10-CM | POA: Diagnosis not present

## 2019-03-11 DIAGNOSIS — N2581 Secondary hyperparathyroidism of renal origin: Secondary | ICD-10-CM | POA: Diagnosis not present

## 2019-03-11 DIAGNOSIS — N186 End stage renal disease: Secondary | ICD-10-CM | POA: Diagnosis not present

## 2019-03-11 DIAGNOSIS — D509 Iron deficiency anemia, unspecified: Secondary | ICD-10-CM | POA: Diagnosis not present

## 2019-03-13 DIAGNOSIS — Z992 Dependence on renal dialysis: Secondary | ICD-10-CM | POA: Diagnosis not present

## 2019-03-13 DIAGNOSIS — N2581 Secondary hyperparathyroidism of renal origin: Secondary | ICD-10-CM | POA: Diagnosis not present

## 2019-03-13 DIAGNOSIS — N186 End stage renal disease: Secondary | ICD-10-CM | POA: Diagnosis not present

## 2019-03-13 DIAGNOSIS — D631 Anemia in chronic kidney disease: Secondary | ICD-10-CM | POA: Diagnosis not present

## 2019-03-13 DIAGNOSIS — D509 Iron deficiency anemia, unspecified: Secondary | ICD-10-CM | POA: Diagnosis not present

## 2019-03-13 DIAGNOSIS — E1129 Type 2 diabetes mellitus with other diabetic kidney complication: Secondary | ICD-10-CM | POA: Diagnosis not present

## 2019-03-16 DIAGNOSIS — D509 Iron deficiency anemia, unspecified: Secondary | ICD-10-CM | POA: Diagnosis not present

## 2019-03-16 DIAGNOSIS — N186 End stage renal disease: Secondary | ICD-10-CM | POA: Diagnosis not present

## 2019-03-16 DIAGNOSIS — N2581 Secondary hyperparathyroidism of renal origin: Secondary | ICD-10-CM | POA: Diagnosis not present

## 2019-03-16 DIAGNOSIS — E1129 Type 2 diabetes mellitus with other diabetic kidney complication: Secondary | ICD-10-CM | POA: Diagnosis not present

## 2019-03-16 DIAGNOSIS — D631 Anemia in chronic kidney disease: Secondary | ICD-10-CM | POA: Diagnosis not present

## 2019-03-16 DIAGNOSIS — Z992 Dependence on renal dialysis: Secondary | ICD-10-CM | POA: Diagnosis not present

## 2019-03-18 DIAGNOSIS — D509 Iron deficiency anemia, unspecified: Secondary | ICD-10-CM | POA: Diagnosis not present

## 2019-03-18 DIAGNOSIS — E1129 Type 2 diabetes mellitus with other diabetic kidney complication: Secondary | ICD-10-CM | POA: Diagnosis not present

## 2019-03-18 DIAGNOSIS — D631 Anemia in chronic kidney disease: Secondary | ICD-10-CM | POA: Diagnosis not present

## 2019-03-18 DIAGNOSIS — N186 End stage renal disease: Secondary | ICD-10-CM | POA: Diagnosis not present

## 2019-03-18 DIAGNOSIS — Z992 Dependence on renal dialysis: Secondary | ICD-10-CM | POA: Diagnosis not present

## 2019-03-18 DIAGNOSIS — N2581 Secondary hyperparathyroidism of renal origin: Secondary | ICD-10-CM | POA: Diagnosis not present

## 2019-03-20 DIAGNOSIS — D509 Iron deficiency anemia, unspecified: Secondary | ICD-10-CM | POA: Diagnosis not present

## 2019-03-20 DIAGNOSIS — E1129 Type 2 diabetes mellitus with other diabetic kidney complication: Secondary | ICD-10-CM | POA: Diagnosis not present

## 2019-03-20 DIAGNOSIS — Z992 Dependence on renal dialysis: Secondary | ICD-10-CM | POA: Diagnosis not present

## 2019-03-20 DIAGNOSIS — D631 Anemia in chronic kidney disease: Secondary | ICD-10-CM | POA: Diagnosis not present

## 2019-03-20 DIAGNOSIS — N186 End stage renal disease: Secondary | ICD-10-CM | POA: Diagnosis not present

## 2019-03-20 DIAGNOSIS — N2581 Secondary hyperparathyroidism of renal origin: Secondary | ICD-10-CM | POA: Diagnosis not present

## 2019-03-23 DIAGNOSIS — N2581 Secondary hyperparathyroidism of renal origin: Secondary | ICD-10-CM | POA: Diagnosis not present

## 2019-03-23 DIAGNOSIS — D631 Anemia in chronic kidney disease: Secondary | ICD-10-CM | POA: Diagnosis not present

## 2019-03-23 DIAGNOSIS — Z992 Dependence on renal dialysis: Secondary | ICD-10-CM | POA: Diagnosis not present

## 2019-03-23 DIAGNOSIS — N186 End stage renal disease: Secondary | ICD-10-CM | POA: Diagnosis not present

## 2019-03-23 DIAGNOSIS — E1129 Type 2 diabetes mellitus with other diabetic kidney complication: Secondary | ICD-10-CM | POA: Diagnosis not present

## 2019-03-23 DIAGNOSIS — D509 Iron deficiency anemia, unspecified: Secondary | ICD-10-CM | POA: Diagnosis not present

## 2019-03-25 DIAGNOSIS — N2581 Secondary hyperparathyroidism of renal origin: Secondary | ICD-10-CM | POA: Diagnosis not present

## 2019-03-25 DIAGNOSIS — D509 Iron deficiency anemia, unspecified: Secondary | ICD-10-CM | POA: Diagnosis not present

## 2019-03-25 DIAGNOSIS — E1129 Type 2 diabetes mellitus with other diabetic kidney complication: Secondary | ICD-10-CM | POA: Diagnosis not present

## 2019-03-25 DIAGNOSIS — N186 End stage renal disease: Secondary | ICD-10-CM | POA: Diagnosis not present

## 2019-03-25 DIAGNOSIS — D631 Anemia in chronic kidney disease: Secondary | ICD-10-CM | POA: Diagnosis not present

## 2019-03-25 DIAGNOSIS — Z992 Dependence on renal dialysis: Secondary | ICD-10-CM | POA: Diagnosis not present

## 2019-03-27 DIAGNOSIS — N2581 Secondary hyperparathyroidism of renal origin: Secondary | ICD-10-CM | POA: Diagnosis not present

## 2019-03-27 DIAGNOSIS — D631 Anemia in chronic kidney disease: Secondary | ICD-10-CM | POA: Diagnosis not present

## 2019-03-27 DIAGNOSIS — Z992 Dependence on renal dialysis: Secondary | ICD-10-CM | POA: Diagnosis not present

## 2019-03-27 DIAGNOSIS — N186 End stage renal disease: Secondary | ICD-10-CM | POA: Diagnosis not present

## 2019-03-27 DIAGNOSIS — E1129 Type 2 diabetes mellitus with other diabetic kidney complication: Secondary | ICD-10-CM | POA: Diagnosis not present

## 2019-03-27 DIAGNOSIS — D509 Iron deficiency anemia, unspecified: Secondary | ICD-10-CM | POA: Diagnosis not present

## 2019-03-30 DIAGNOSIS — D509 Iron deficiency anemia, unspecified: Secondary | ICD-10-CM | POA: Diagnosis not present

## 2019-03-30 DIAGNOSIS — E1129 Type 2 diabetes mellitus with other diabetic kidney complication: Secondary | ICD-10-CM | POA: Diagnosis not present

## 2019-03-30 DIAGNOSIS — N186 End stage renal disease: Secondary | ICD-10-CM | POA: Diagnosis not present

## 2019-03-30 DIAGNOSIS — N2581 Secondary hyperparathyroidism of renal origin: Secondary | ICD-10-CM | POA: Diagnosis not present

## 2019-03-30 DIAGNOSIS — Z992 Dependence on renal dialysis: Secondary | ICD-10-CM | POA: Diagnosis not present

## 2019-03-30 DIAGNOSIS — D631 Anemia in chronic kidney disease: Secondary | ICD-10-CM | POA: Diagnosis not present

## 2019-04-01 DIAGNOSIS — I120 Hypertensive chronic kidney disease with stage 5 chronic kidney disease or end stage renal disease: Secondary | ICD-10-CM | POA: Diagnosis not present

## 2019-04-01 DIAGNOSIS — D631 Anemia in chronic kidney disease: Secondary | ICD-10-CM | POA: Diagnosis not present

## 2019-04-01 DIAGNOSIS — N186 End stage renal disease: Secondary | ICD-10-CM | POA: Diagnosis not present

## 2019-04-01 DIAGNOSIS — E1129 Type 2 diabetes mellitus with other diabetic kidney complication: Secondary | ICD-10-CM | POA: Diagnosis not present

## 2019-04-01 DIAGNOSIS — Z992 Dependence on renal dialysis: Secondary | ICD-10-CM | POA: Diagnosis not present

## 2019-04-01 DIAGNOSIS — N2581 Secondary hyperparathyroidism of renal origin: Secondary | ICD-10-CM | POA: Diagnosis not present

## 2019-04-03 DIAGNOSIS — D631 Anemia in chronic kidney disease: Secondary | ICD-10-CM | POA: Diagnosis not present

## 2019-04-03 DIAGNOSIS — N186 End stage renal disease: Secondary | ICD-10-CM | POA: Diagnosis not present

## 2019-04-03 DIAGNOSIS — N2581 Secondary hyperparathyroidism of renal origin: Secondary | ICD-10-CM | POA: Diagnosis not present

## 2019-04-03 DIAGNOSIS — E1129 Type 2 diabetes mellitus with other diabetic kidney complication: Secondary | ICD-10-CM | POA: Diagnosis not present

## 2019-04-03 DIAGNOSIS — Z992 Dependence on renal dialysis: Secondary | ICD-10-CM | POA: Diagnosis not present

## 2019-04-06 DIAGNOSIS — D631 Anemia in chronic kidney disease: Secondary | ICD-10-CM | POA: Diagnosis not present

## 2019-04-06 DIAGNOSIS — N186 End stage renal disease: Secondary | ICD-10-CM | POA: Diagnosis not present

## 2019-04-06 DIAGNOSIS — E1129 Type 2 diabetes mellitus with other diabetic kidney complication: Secondary | ICD-10-CM | POA: Diagnosis not present

## 2019-04-06 DIAGNOSIS — N2581 Secondary hyperparathyroidism of renal origin: Secondary | ICD-10-CM | POA: Diagnosis not present

## 2019-04-06 DIAGNOSIS — Z992 Dependence on renal dialysis: Secondary | ICD-10-CM | POA: Diagnosis not present

## 2019-04-08 DIAGNOSIS — N186 End stage renal disease: Secondary | ICD-10-CM | POA: Diagnosis not present

## 2019-04-08 DIAGNOSIS — D631 Anemia in chronic kidney disease: Secondary | ICD-10-CM | POA: Diagnosis not present

## 2019-04-08 DIAGNOSIS — Z992 Dependence on renal dialysis: Secondary | ICD-10-CM | POA: Diagnosis not present

## 2019-04-08 DIAGNOSIS — E1129 Type 2 diabetes mellitus with other diabetic kidney complication: Secondary | ICD-10-CM | POA: Diagnosis not present

## 2019-04-08 DIAGNOSIS — N2581 Secondary hyperparathyroidism of renal origin: Secondary | ICD-10-CM | POA: Diagnosis not present

## 2019-04-10 DIAGNOSIS — Z992 Dependence on renal dialysis: Secondary | ICD-10-CM | POA: Diagnosis not present

## 2019-04-10 DIAGNOSIS — N186 End stage renal disease: Secondary | ICD-10-CM | POA: Diagnosis not present

## 2019-04-10 DIAGNOSIS — D631 Anemia in chronic kidney disease: Secondary | ICD-10-CM | POA: Diagnosis not present

## 2019-04-10 DIAGNOSIS — E1129 Type 2 diabetes mellitus with other diabetic kidney complication: Secondary | ICD-10-CM | POA: Diagnosis not present

## 2019-04-10 DIAGNOSIS — N2581 Secondary hyperparathyroidism of renal origin: Secondary | ICD-10-CM | POA: Diagnosis not present

## 2019-04-13 DIAGNOSIS — N2581 Secondary hyperparathyroidism of renal origin: Secondary | ICD-10-CM | POA: Diagnosis not present

## 2019-04-13 DIAGNOSIS — E1129 Type 2 diabetes mellitus with other diabetic kidney complication: Secondary | ICD-10-CM | POA: Diagnosis not present

## 2019-04-13 DIAGNOSIS — D631 Anemia in chronic kidney disease: Secondary | ICD-10-CM | POA: Diagnosis not present

## 2019-04-13 DIAGNOSIS — Z992 Dependence on renal dialysis: Secondary | ICD-10-CM | POA: Diagnosis not present

## 2019-04-13 DIAGNOSIS — N186 End stage renal disease: Secondary | ICD-10-CM | POA: Diagnosis not present

## 2019-04-15 DIAGNOSIS — N186 End stage renal disease: Secondary | ICD-10-CM | POA: Diagnosis not present

## 2019-04-15 DIAGNOSIS — Z992 Dependence on renal dialysis: Secondary | ICD-10-CM | POA: Diagnosis not present

## 2019-04-15 DIAGNOSIS — D631 Anemia in chronic kidney disease: Secondary | ICD-10-CM | POA: Diagnosis not present

## 2019-04-15 DIAGNOSIS — N2581 Secondary hyperparathyroidism of renal origin: Secondary | ICD-10-CM | POA: Diagnosis not present

## 2019-04-15 DIAGNOSIS — E1129 Type 2 diabetes mellitus with other diabetic kidney complication: Secondary | ICD-10-CM | POA: Diagnosis not present

## 2019-04-17 DIAGNOSIS — E1129 Type 2 diabetes mellitus with other diabetic kidney complication: Secondary | ICD-10-CM | POA: Diagnosis not present

## 2019-04-17 DIAGNOSIS — N2581 Secondary hyperparathyroidism of renal origin: Secondary | ICD-10-CM | POA: Diagnosis not present

## 2019-04-17 DIAGNOSIS — N186 End stage renal disease: Secondary | ICD-10-CM | POA: Diagnosis not present

## 2019-04-17 DIAGNOSIS — Z992 Dependence on renal dialysis: Secondary | ICD-10-CM | POA: Diagnosis not present

## 2019-04-17 DIAGNOSIS — D631 Anemia in chronic kidney disease: Secondary | ICD-10-CM | POA: Diagnosis not present

## 2019-04-20 DIAGNOSIS — N2581 Secondary hyperparathyroidism of renal origin: Secondary | ICD-10-CM | POA: Diagnosis not present

## 2019-04-20 DIAGNOSIS — E1129 Type 2 diabetes mellitus with other diabetic kidney complication: Secondary | ICD-10-CM | POA: Diagnosis not present

## 2019-04-20 DIAGNOSIS — Z992 Dependence on renal dialysis: Secondary | ICD-10-CM | POA: Diagnosis not present

## 2019-04-20 DIAGNOSIS — N186 End stage renal disease: Secondary | ICD-10-CM | POA: Diagnosis not present

## 2019-04-20 DIAGNOSIS — D631 Anemia in chronic kidney disease: Secondary | ICD-10-CM | POA: Diagnosis not present

## 2019-04-22 DIAGNOSIS — N186 End stage renal disease: Secondary | ICD-10-CM | POA: Diagnosis not present

## 2019-04-22 DIAGNOSIS — E1129 Type 2 diabetes mellitus with other diabetic kidney complication: Secondary | ICD-10-CM | POA: Diagnosis not present

## 2019-04-22 DIAGNOSIS — N2581 Secondary hyperparathyroidism of renal origin: Secondary | ICD-10-CM | POA: Diagnosis not present

## 2019-04-22 DIAGNOSIS — Z992 Dependence on renal dialysis: Secondary | ICD-10-CM | POA: Diagnosis not present

## 2019-04-22 DIAGNOSIS — D631 Anemia in chronic kidney disease: Secondary | ICD-10-CM | POA: Diagnosis not present

## 2019-04-24 DIAGNOSIS — D631 Anemia in chronic kidney disease: Secondary | ICD-10-CM | POA: Diagnosis not present

## 2019-04-24 DIAGNOSIS — Z992 Dependence on renal dialysis: Secondary | ICD-10-CM | POA: Diagnosis not present

## 2019-04-24 DIAGNOSIS — N186 End stage renal disease: Secondary | ICD-10-CM | POA: Diagnosis not present

## 2019-04-24 DIAGNOSIS — N2581 Secondary hyperparathyroidism of renal origin: Secondary | ICD-10-CM | POA: Diagnosis not present

## 2019-04-24 DIAGNOSIS — E1129 Type 2 diabetes mellitus with other diabetic kidney complication: Secondary | ICD-10-CM | POA: Diagnosis not present

## 2019-04-27 DIAGNOSIS — E1129 Type 2 diabetes mellitus with other diabetic kidney complication: Secondary | ICD-10-CM | POA: Diagnosis not present

## 2019-04-27 DIAGNOSIS — N186 End stage renal disease: Secondary | ICD-10-CM | POA: Diagnosis not present

## 2019-04-27 DIAGNOSIS — D631 Anemia in chronic kidney disease: Secondary | ICD-10-CM | POA: Diagnosis not present

## 2019-04-27 DIAGNOSIS — Z992 Dependence on renal dialysis: Secondary | ICD-10-CM | POA: Diagnosis not present

## 2019-04-27 DIAGNOSIS — N2581 Secondary hyperparathyroidism of renal origin: Secondary | ICD-10-CM | POA: Diagnosis not present

## 2019-04-29 DIAGNOSIS — Z992 Dependence on renal dialysis: Secondary | ICD-10-CM | POA: Diagnosis not present

## 2019-04-29 DIAGNOSIS — N2581 Secondary hyperparathyroidism of renal origin: Secondary | ICD-10-CM | POA: Diagnosis not present

## 2019-04-29 DIAGNOSIS — E1129 Type 2 diabetes mellitus with other diabetic kidney complication: Secondary | ICD-10-CM | POA: Diagnosis not present

## 2019-04-29 DIAGNOSIS — D631 Anemia in chronic kidney disease: Secondary | ICD-10-CM | POA: Diagnosis not present

## 2019-04-29 DIAGNOSIS — N186 End stage renal disease: Secondary | ICD-10-CM | POA: Diagnosis not present

## 2019-05-01 DIAGNOSIS — N186 End stage renal disease: Secondary | ICD-10-CM | POA: Diagnosis not present

## 2019-05-01 DIAGNOSIS — D631 Anemia in chronic kidney disease: Secondary | ICD-10-CM | POA: Diagnosis not present

## 2019-05-01 DIAGNOSIS — N2581 Secondary hyperparathyroidism of renal origin: Secondary | ICD-10-CM | POA: Diagnosis not present

## 2019-05-01 DIAGNOSIS — I120 Hypertensive chronic kidney disease with stage 5 chronic kidney disease or end stage renal disease: Secondary | ICD-10-CM | POA: Diagnosis not present

## 2019-05-01 DIAGNOSIS — Z992 Dependence on renal dialysis: Secondary | ICD-10-CM | POA: Diagnosis not present

## 2019-05-01 DIAGNOSIS — E1129 Type 2 diabetes mellitus with other diabetic kidney complication: Secondary | ICD-10-CM | POA: Diagnosis not present

## 2019-05-04 DIAGNOSIS — N2581 Secondary hyperparathyroidism of renal origin: Secondary | ICD-10-CM | POA: Diagnosis not present

## 2019-05-04 DIAGNOSIS — D631 Anemia in chronic kidney disease: Secondary | ICD-10-CM | POA: Diagnosis not present

## 2019-05-04 DIAGNOSIS — Z992 Dependence on renal dialysis: Secondary | ICD-10-CM | POA: Diagnosis not present

## 2019-05-04 DIAGNOSIS — E1129 Type 2 diabetes mellitus with other diabetic kidney complication: Secondary | ICD-10-CM | POA: Diagnosis not present

## 2019-05-04 DIAGNOSIS — N186 End stage renal disease: Secondary | ICD-10-CM | POA: Diagnosis not present

## 2019-05-06 DIAGNOSIS — N2581 Secondary hyperparathyroidism of renal origin: Secondary | ICD-10-CM | POA: Diagnosis not present

## 2019-05-06 DIAGNOSIS — D631 Anemia in chronic kidney disease: Secondary | ICD-10-CM | POA: Diagnosis not present

## 2019-05-06 DIAGNOSIS — Z992 Dependence on renal dialysis: Secondary | ICD-10-CM | POA: Diagnosis not present

## 2019-05-06 DIAGNOSIS — E1129 Type 2 diabetes mellitus with other diabetic kidney complication: Secondary | ICD-10-CM | POA: Diagnosis not present

## 2019-05-06 DIAGNOSIS — N186 End stage renal disease: Secondary | ICD-10-CM | POA: Diagnosis not present

## 2019-05-08 DIAGNOSIS — E1129 Type 2 diabetes mellitus with other diabetic kidney complication: Secondary | ICD-10-CM | POA: Diagnosis not present

## 2019-05-08 DIAGNOSIS — D631 Anemia in chronic kidney disease: Secondary | ICD-10-CM | POA: Diagnosis not present

## 2019-05-08 DIAGNOSIS — N186 End stage renal disease: Secondary | ICD-10-CM | POA: Diagnosis not present

## 2019-05-08 DIAGNOSIS — N2581 Secondary hyperparathyroidism of renal origin: Secondary | ICD-10-CM | POA: Diagnosis not present

## 2019-05-08 DIAGNOSIS — Z992 Dependence on renal dialysis: Secondary | ICD-10-CM | POA: Diagnosis not present

## 2019-05-11 DIAGNOSIS — D631 Anemia in chronic kidney disease: Secondary | ICD-10-CM | POA: Diagnosis not present

## 2019-05-11 DIAGNOSIS — N2581 Secondary hyperparathyroidism of renal origin: Secondary | ICD-10-CM | POA: Diagnosis not present

## 2019-05-11 DIAGNOSIS — N186 End stage renal disease: Secondary | ICD-10-CM | POA: Diagnosis not present

## 2019-05-11 DIAGNOSIS — E1129 Type 2 diabetes mellitus with other diabetic kidney complication: Secondary | ICD-10-CM | POA: Diagnosis not present

## 2019-05-11 DIAGNOSIS — Z992 Dependence on renal dialysis: Secondary | ICD-10-CM | POA: Diagnosis not present

## 2019-05-13 DIAGNOSIS — E1129 Type 2 diabetes mellitus with other diabetic kidney complication: Secondary | ICD-10-CM | POA: Diagnosis not present

## 2019-05-13 DIAGNOSIS — D631 Anemia in chronic kidney disease: Secondary | ICD-10-CM | POA: Diagnosis not present

## 2019-05-13 DIAGNOSIS — Z992 Dependence on renal dialysis: Secondary | ICD-10-CM | POA: Diagnosis not present

## 2019-05-13 DIAGNOSIS — N186 End stage renal disease: Secondary | ICD-10-CM | POA: Diagnosis not present

## 2019-05-13 DIAGNOSIS — N2581 Secondary hyperparathyroidism of renal origin: Secondary | ICD-10-CM | POA: Diagnosis not present

## 2019-05-15 DIAGNOSIS — D631 Anemia in chronic kidney disease: Secondary | ICD-10-CM | POA: Diagnosis not present

## 2019-05-15 DIAGNOSIS — N186 End stage renal disease: Secondary | ICD-10-CM | POA: Diagnosis not present

## 2019-05-15 DIAGNOSIS — N2581 Secondary hyperparathyroidism of renal origin: Secondary | ICD-10-CM | POA: Diagnosis not present

## 2019-05-15 DIAGNOSIS — E1129 Type 2 diabetes mellitus with other diabetic kidney complication: Secondary | ICD-10-CM | POA: Diagnosis not present

## 2019-05-15 DIAGNOSIS — Z992 Dependence on renal dialysis: Secondary | ICD-10-CM | POA: Diagnosis not present

## 2019-05-18 DIAGNOSIS — D631 Anemia in chronic kidney disease: Secondary | ICD-10-CM | POA: Diagnosis not present

## 2019-05-18 DIAGNOSIS — N186 End stage renal disease: Secondary | ICD-10-CM | POA: Diagnosis not present

## 2019-05-18 DIAGNOSIS — E1129 Type 2 diabetes mellitus with other diabetic kidney complication: Secondary | ICD-10-CM | POA: Diagnosis not present

## 2019-05-18 DIAGNOSIS — N2581 Secondary hyperparathyroidism of renal origin: Secondary | ICD-10-CM | POA: Diagnosis not present

## 2019-05-18 DIAGNOSIS — Z992 Dependence on renal dialysis: Secondary | ICD-10-CM | POA: Diagnosis not present

## 2019-05-20 DIAGNOSIS — E1129 Type 2 diabetes mellitus with other diabetic kidney complication: Secondary | ICD-10-CM | POA: Diagnosis not present

## 2019-05-20 DIAGNOSIS — N186 End stage renal disease: Secondary | ICD-10-CM | POA: Diagnosis not present

## 2019-05-20 DIAGNOSIS — D631 Anemia in chronic kidney disease: Secondary | ICD-10-CM | POA: Diagnosis not present

## 2019-05-20 DIAGNOSIS — N2581 Secondary hyperparathyroidism of renal origin: Secondary | ICD-10-CM | POA: Diagnosis not present

## 2019-05-20 DIAGNOSIS — Z992 Dependence on renal dialysis: Secondary | ICD-10-CM | POA: Diagnosis not present

## 2019-05-22 DIAGNOSIS — N2581 Secondary hyperparathyroidism of renal origin: Secondary | ICD-10-CM | POA: Diagnosis not present

## 2019-05-22 DIAGNOSIS — N186 End stage renal disease: Secondary | ICD-10-CM | POA: Diagnosis not present

## 2019-05-22 DIAGNOSIS — D631 Anemia in chronic kidney disease: Secondary | ICD-10-CM | POA: Diagnosis not present

## 2019-05-22 DIAGNOSIS — Z992 Dependence on renal dialysis: Secondary | ICD-10-CM | POA: Diagnosis not present

## 2019-05-22 DIAGNOSIS — E1129 Type 2 diabetes mellitus with other diabetic kidney complication: Secondary | ICD-10-CM | POA: Diagnosis not present

## 2019-05-25 DIAGNOSIS — N186 End stage renal disease: Secondary | ICD-10-CM | POA: Diagnosis not present

## 2019-05-25 DIAGNOSIS — E1129 Type 2 diabetes mellitus with other diabetic kidney complication: Secondary | ICD-10-CM | POA: Diagnosis not present

## 2019-05-25 DIAGNOSIS — Z992 Dependence on renal dialysis: Secondary | ICD-10-CM | POA: Diagnosis not present

## 2019-05-25 DIAGNOSIS — N2581 Secondary hyperparathyroidism of renal origin: Secondary | ICD-10-CM | POA: Diagnosis not present

## 2019-05-25 DIAGNOSIS — D631 Anemia in chronic kidney disease: Secondary | ICD-10-CM | POA: Diagnosis not present

## 2019-05-27 DIAGNOSIS — N2581 Secondary hyperparathyroidism of renal origin: Secondary | ICD-10-CM | POA: Diagnosis not present

## 2019-05-27 DIAGNOSIS — D631 Anemia in chronic kidney disease: Secondary | ICD-10-CM | POA: Diagnosis not present

## 2019-05-27 DIAGNOSIS — N186 End stage renal disease: Secondary | ICD-10-CM | POA: Diagnosis not present

## 2019-05-27 DIAGNOSIS — Z992 Dependence on renal dialysis: Secondary | ICD-10-CM | POA: Diagnosis not present

## 2019-05-27 DIAGNOSIS — E1129 Type 2 diabetes mellitus with other diabetic kidney complication: Secondary | ICD-10-CM | POA: Diagnosis not present

## 2019-05-29 DIAGNOSIS — D631 Anemia in chronic kidney disease: Secondary | ICD-10-CM | POA: Diagnosis not present

## 2019-05-29 DIAGNOSIS — Z992 Dependence on renal dialysis: Secondary | ICD-10-CM | POA: Diagnosis not present

## 2019-05-29 DIAGNOSIS — N2581 Secondary hyperparathyroidism of renal origin: Secondary | ICD-10-CM | POA: Diagnosis not present

## 2019-05-29 DIAGNOSIS — E1129 Type 2 diabetes mellitus with other diabetic kidney complication: Secondary | ICD-10-CM | POA: Diagnosis not present

## 2019-05-29 DIAGNOSIS — N186 End stage renal disease: Secondary | ICD-10-CM | POA: Diagnosis not present

## 2019-06-01 DIAGNOSIS — N186 End stage renal disease: Secondary | ICD-10-CM | POA: Diagnosis not present

## 2019-06-01 DIAGNOSIS — Z992 Dependence on renal dialysis: Secondary | ICD-10-CM | POA: Diagnosis not present

## 2019-06-01 DIAGNOSIS — E1129 Type 2 diabetes mellitus with other diabetic kidney complication: Secondary | ICD-10-CM | POA: Diagnosis not present

## 2019-06-01 DIAGNOSIS — N2581 Secondary hyperparathyroidism of renal origin: Secondary | ICD-10-CM | POA: Diagnosis not present

## 2019-06-01 DIAGNOSIS — I120 Hypertensive chronic kidney disease with stage 5 chronic kidney disease or end stage renal disease: Secondary | ICD-10-CM | POA: Diagnosis not present

## 2019-06-01 DIAGNOSIS — D631 Anemia in chronic kidney disease: Secondary | ICD-10-CM | POA: Diagnosis not present

## 2019-06-03 DIAGNOSIS — N186 End stage renal disease: Secondary | ICD-10-CM | POA: Diagnosis not present

## 2019-06-03 DIAGNOSIS — E1129 Type 2 diabetes mellitus with other diabetic kidney complication: Secondary | ICD-10-CM | POA: Diagnosis not present

## 2019-06-03 DIAGNOSIS — Z992 Dependence on renal dialysis: Secondary | ICD-10-CM | POA: Diagnosis not present

## 2019-06-03 DIAGNOSIS — D631 Anemia in chronic kidney disease: Secondary | ICD-10-CM | POA: Diagnosis not present

## 2019-06-03 DIAGNOSIS — N2581 Secondary hyperparathyroidism of renal origin: Secondary | ICD-10-CM | POA: Diagnosis not present

## 2019-06-05 DIAGNOSIS — E1129 Type 2 diabetes mellitus with other diabetic kidney complication: Secondary | ICD-10-CM | POA: Diagnosis not present

## 2019-06-05 DIAGNOSIS — N186 End stage renal disease: Secondary | ICD-10-CM | POA: Diagnosis not present

## 2019-06-05 DIAGNOSIS — Z992 Dependence on renal dialysis: Secondary | ICD-10-CM | POA: Diagnosis not present

## 2019-06-05 DIAGNOSIS — N2581 Secondary hyperparathyroidism of renal origin: Secondary | ICD-10-CM | POA: Diagnosis not present

## 2019-06-05 DIAGNOSIS — D631 Anemia in chronic kidney disease: Secondary | ICD-10-CM | POA: Diagnosis not present

## 2019-06-08 DIAGNOSIS — E1129 Type 2 diabetes mellitus with other diabetic kidney complication: Secondary | ICD-10-CM | POA: Diagnosis not present

## 2019-06-08 DIAGNOSIS — N186 End stage renal disease: Secondary | ICD-10-CM | POA: Diagnosis not present

## 2019-06-08 DIAGNOSIS — Z992 Dependence on renal dialysis: Secondary | ICD-10-CM | POA: Diagnosis not present

## 2019-06-08 DIAGNOSIS — D631 Anemia in chronic kidney disease: Secondary | ICD-10-CM | POA: Diagnosis not present

## 2019-06-08 DIAGNOSIS — N2581 Secondary hyperparathyroidism of renal origin: Secondary | ICD-10-CM | POA: Diagnosis not present

## 2019-06-10 DIAGNOSIS — E1129 Type 2 diabetes mellitus with other diabetic kidney complication: Secondary | ICD-10-CM | POA: Diagnosis not present

## 2019-06-10 DIAGNOSIS — N186 End stage renal disease: Secondary | ICD-10-CM | POA: Diagnosis not present

## 2019-06-10 DIAGNOSIS — N2581 Secondary hyperparathyroidism of renal origin: Secondary | ICD-10-CM | POA: Diagnosis not present

## 2019-06-10 DIAGNOSIS — D631 Anemia in chronic kidney disease: Secondary | ICD-10-CM | POA: Diagnosis not present

## 2019-06-10 DIAGNOSIS — Z992 Dependence on renal dialysis: Secondary | ICD-10-CM | POA: Diagnosis not present

## 2019-06-12 DIAGNOSIS — N2581 Secondary hyperparathyroidism of renal origin: Secondary | ICD-10-CM | POA: Diagnosis not present

## 2019-06-12 DIAGNOSIS — D631 Anemia in chronic kidney disease: Secondary | ICD-10-CM | POA: Diagnosis not present

## 2019-06-12 DIAGNOSIS — E1129 Type 2 diabetes mellitus with other diabetic kidney complication: Secondary | ICD-10-CM | POA: Diagnosis not present

## 2019-06-12 DIAGNOSIS — N186 End stage renal disease: Secondary | ICD-10-CM | POA: Diagnosis not present

## 2019-06-12 DIAGNOSIS — Z992 Dependence on renal dialysis: Secondary | ICD-10-CM | POA: Diagnosis not present

## 2019-06-15 DIAGNOSIS — Z992 Dependence on renal dialysis: Secondary | ICD-10-CM | POA: Diagnosis not present

## 2019-06-15 DIAGNOSIS — N186 End stage renal disease: Secondary | ICD-10-CM | POA: Diagnosis not present

## 2019-06-15 DIAGNOSIS — E1129 Type 2 diabetes mellitus with other diabetic kidney complication: Secondary | ICD-10-CM | POA: Diagnosis not present

## 2019-06-15 DIAGNOSIS — N2581 Secondary hyperparathyroidism of renal origin: Secondary | ICD-10-CM | POA: Diagnosis not present

## 2019-06-15 DIAGNOSIS — D631 Anemia in chronic kidney disease: Secondary | ICD-10-CM | POA: Diagnosis not present

## 2019-06-16 ENCOUNTER — Ambulatory Visit: Payer: Medicare Other | Admitting: Sports Medicine

## 2019-06-17 DIAGNOSIS — D631 Anemia in chronic kidney disease: Secondary | ICD-10-CM | POA: Diagnosis not present

## 2019-06-17 DIAGNOSIS — E1129 Type 2 diabetes mellitus with other diabetic kidney complication: Secondary | ICD-10-CM | POA: Diagnosis not present

## 2019-06-17 DIAGNOSIS — N186 End stage renal disease: Secondary | ICD-10-CM | POA: Diagnosis not present

## 2019-06-17 DIAGNOSIS — Z992 Dependence on renal dialysis: Secondary | ICD-10-CM | POA: Diagnosis not present

## 2019-06-17 DIAGNOSIS — N2581 Secondary hyperparathyroidism of renal origin: Secondary | ICD-10-CM | POA: Diagnosis not present

## 2019-06-19 DIAGNOSIS — Z992 Dependence on renal dialysis: Secondary | ICD-10-CM | POA: Diagnosis not present

## 2019-06-19 DIAGNOSIS — E1129 Type 2 diabetes mellitus with other diabetic kidney complication: Secondary | ICD-10-CM | POA: Diagnosis not present

## 2019-06-19 DIAGNOSIS — N2581 Secondary hyperparathyroidism of renal origin: Secondary | ICD-10-CM | POA: Diagnosis not present

## 2019-06-19 DIAGNOSIS — N186 End stage renal disease: Secondary | ICD-10-CM | POA: Diagnosis not present

## 2019-06-19 DIAGNOSIS — D631 Anemia in chronic kidney disease: Secondary | ICD-10-CM | POA: Diagnosis not present

## 2019-06-22 DIAGNOSIS — N186 End stage renal disease: Secondary | ICD-10-CM | POA: Diagnosis not present

## 2019-06-22 DIAGNOSIS — N2581 Secondary hyperparathyroidism of renal origin: Secondary | ICD-10-CM | POA: Diagnosis not present

## 2019-06-22 DIAGNOSIS — E1129 Type 2 diabetes mellitus with other diabetic kidney complication: Secondary | ICD-10-CM | POA: Diagnosis not present

## 2019-06-22 DIAGNOSIS — Z992 Dependence on renal dialysis: Secondary | ICD-10-CM | POA: Diagnosis not present

## 2019-06-22 DIAGNOSIS — D631 Anemia in chronic kidney disease: Secondary | ICD-10-CM | POA: Diagnosis not present

## 2019-06-24 DIAGNOSIS — D631 Anemia in chronic kidney disease: Secondary | ICD-10-CM | POA: Diagnosis not present

## 2019-06-24 DIAGNOSIS — N186 End stage renal disease: Secondary | ICD-10-CM | POA: Diagnosis not present

## 2019-06-24 DIAGNOSIS — Z992 Dependence on renal dialysis: Secondary | ICD-10-CM | POA: Diagnosis not present

## 2019-06-24 DIAGNOSIS — E1129 Type 2 diabetes mellitus with other diabetic kidney complication: Secondary | ICD-10-CM | POA: Diagnosis not present

## 2019-06-24 DIAGNOSIS — N2581 Secondary hyperparathyroidism of renal origin: Secondary | ICD-10-CM | POA: Diagnosis not present

## 2019-06-26 DIAGNOSIS — E1129 Type 2 diabetes mellitus with other diabetic kidney complication: Secondary | ICD-10-CM | POA: Diagnosis not present

## 2019-06-26 DIAGNOSIS — N186 End stage renal disease: Secondary | ICD-10-CM | POA: Diagnosis not present

## 2019-06-26 DIAGNOSIS — Z992 Dependence on renal dialysis: Secondary | ICD-10-CM | POA: Diagnosis not present

## 2019-06-26 DIAGNOSIS — N2581 Secondary hyperparathyroidism of renal origin: Secondary | ICD-10-CM | POA: Diagnosis not present

## 2019-06-26 DIAGNOSIS — D631 Anemia in chronic kidney disease: Secondary | ICD-10-CM | POA: Diagnosis not present

## 2019-06-29 DIAGNOSIS — N2581 Secondary hyperparathyroidism of renal origin: Secondary | ICD-10-CM | POA: Diagnosis not present

## 2019-06-29 DIAGNOSIS — N186 End stage renal disease: Secondary | ICD-10-CM | POA: Diagnosis not present

## 2019-06-29 DIAGNOSIS — D631 Anemia in chronic kidney disease: Secondary | ICD-10-CM | POA: Diagnosis not present

## 2019-06-29 DIAGNOSIS — E1129 Type 2 diabetes mellitus with other diabetic kidney complication: Secondary | ICD-10-CM | POA: Diagnosis not present

## 2019-06-29 DIAGNOSIS — Z992 Dependence on renal dialysis: Secondary | ICD-10-CM | POA: Diagnosis not present

## 2019-07-01 DIAGNOSIS — N186 End stage renal disease: Secondary | ICD-10-CM | POA: Diagnosis not present

## 2019-07-01 DIAGNOSIS — E1129 Type 2 diabetes mellitus with other diabetic kidney complication: Secondary | ICD-10-CM | POA: Diagnosis not present

## 2019-07-01 DIAGNOSIS — D631 Anemia in chronic kidney disease: Secondary | ICD-10-CM | POA: Diagnosis not present

## 2019-07-01 DIAGNOSIS — Z992 Dependence on renal dialysis: Secondary | ICD-10-CM | POA: Diagnosis not present

## 2019-07-01 DIAGNOSIS — N2581 Secondary hyperparathyroidism of renal origin: Secondary | ICD-10-CM | POA: Diagnosis not present

## 2019-07-01 DIAGNOSIS — I120 Hypertensive chronic kidney disease with stage 5 chronic kidney disease or end stage renal disease: Secondary | ICD-10-CM | POA: Diagnosis not present

## 2019-07-03 DIAGNOSIS — D631 Anemia in chronic kidney disease: Secondary | ICD-10-CM | POA: Diagnosis not present

## 2019-07-03 DIAGNOSIS — Z992 Dependence on renal dialysis: Secondary | ICD-10-CM | POA: Diagnosis not present

## 2019-07-03 DIAGNOSIS — N186 End stage renal disease: Secondary | ICD-10-CM | POA: Diagnosis not present

## 2019-07-03 DIAGNOSIS — E1129 Type 2 diabetes mellitus with other diabetic kidney complication: Secondary | ICD-10-CM | POA: Diagnosis not present

## 2019-07-03 DIAGNOSIS — N2581 Secondary hyperparathyroidism of renal origin: Secondary | ICD-10-CM | POA: Diagnosis not present

## 2019-07-06 DIAGNOSIS — N2581 Secondary hyperparathyroidism of renal origin: Secondary | ICD-10-CM | POA: Diagnosis not present

## 2019-07-06 DIAGNOSIS — Z992 Dependence on renal dialysis: Secondary | ICD-10-CM | POA: Diagnosis not present

## 2019-07-06 DIAGNOSIS — E1129 Type 2 diabetes mellitus with other diabetic kidney complication: Secondary | ICD-10-CM | POA: Diagnosis not present

## 2019-07-06 DIAGNOSIS — D631 Anemia in chronic kidney disease: Secondary | ICD-10-CM | POA: Diagnosis not present

## 2019-07-06 DIAGNOSIS — N186 End stage renal disease: Secondary | ICD-10-CM | POA: Diagnosis not present

## 2019-07-08 DIAGNOSIS — E1129 Type 2 diabetes mellitus with other diabetic kidney complication: Secondary | ICD-10-CM | POA: Diagnosis not present

## 2019-07-08 DIAGNOSIS — D631 Anemia in chronic kidney disease: Secondary | ICD-10-CM | POA: Diagnosis not present

## 2019-07-08 DIAGNOSIS — Z992 Dependence on renal dialysis: Secondary | ICD-10-CM | POA: Diagnosis not present

## 2019-07-08 DIAGNOSIS — N2581 Secondary hyperparathyroidism of renal origin: Secondary | ICD-10-CM | POA: Diagnosis not present

## 2019-07-08 DIAGNOSIS — N186 End stage renal disease: Secondary | ICD-10-CM | POA: Diagnosis not present

## 2019-07-10 DIAGNOSIS — Z992 Dependence on renal dialysis: Secondary | ICD-10-CM | POA: Diagnosis not present

## 2019-07-10 DIAGNOSIS — N186 End stage renal disease: Secondary | ICD-10-CM | POA: Diagnosis not present

## 2019-07-10 DIAGNOSIS — E1129 Type 2 diabetes mellitus with other diabetic kidney complication: Secondary | ICD-10-CM | POA: Diagnosis not present

## 2019-07-10 DIAGNOSIS — D631 Anemia in chronic kidney disease: Secondary | ICD-10-CM | POA: Diagnosis not present

## 2019-07-10 DIAGNOSIS — N2581 Secondary hyperparathyroidism of renal origin: Secondary | ICD-10-CM | POA: Diagnosis not present

## 2019-07-13 DIAGNOSIS — N2581 Secondary hyperparathyroidism of renal origin: Secondary | ICD-10-CM | POA: Diagnosis not present

## 2019-07-13 DIAGNOSIS — E1129 Type 2 diabetes mellitus with other diabetic kidney complication: Secondary | ICD-10-CM | POA: Diagnosis not present

## 2019-07-13 DIAGNOSIS — Z992 Dependence on renal dialysis: Secondary | ICD-10-CM | POA: Diagnosis not present

## 2019-07-13 DIAGNOSIS — D631 Anemia in chronic kidney disease: Secondary | ICD-10-CM | POA: Diagnosis not present

## 2019-07-13 DIAGNOSIS — N186 End stage renal disease: Secondary | ICD-10-CM | POA: Diagnosis not present

## 2019-07-15 DIAGNOSIS — N186 End stage renal disease: Secondary | ICD-10-CM | POA: Diagnosis not present

## 2019-07-15 DIAGNOSIS — N2581 Secondary hyperparathyroidism of renal origin: Secondary | ICD-10-CM | POA: Diagnosis not present

## 2019-07-15 DIAGNOSIS — D631 Anemia in chronic kidney disease: Secondary | ICD-10-CM | POA: Diagnosis not present

## 2019-07-15 DIAGNOSIS — E1129 Type 2 diabetes mellitus with other diabetic kidney complication: Secondary | ICD-10-CM | POA: Diagnosis not present

## 2019-07-15 DIAGNOSIS — Z992 Dependence on renal dialysis: Secondary | ICD-10-CM | POA: Diagnosis not present

## 2019-07-17 DIAGNOSIS — N186 End stage renal disease: Secondary | ICD-10-CM | POA: Diagnosis not present

## 2019-07-17 DIAGNOSIS — Z992 Dependence on renal dialysis: Secondary | ICD-10-CM | POA: Diagnosis not present

## 2019-07-17 DIAGNOSIS — E1129 Type 2 diabetes mellitus with other diabetic kidney complication: Secondary | ICD-10-CM | POA: Diagnosis not present

## 2019-07-17 DIAGNOSIS — D631 Anemia in chronic kidney disease: Secondary | ICD-10-CM | POA: Diagnosis not present

## 2019-07-17 DIAGNOSIS — N2581 Secondary hyperparathyroidism of renal origin: Secondary | ICD-10-CM | POA: Diagnosis not present

## 2019-07-20 DIAGNOSIS — N186 End stage renal disease: Secondary | ICD-10-CM | POA: Diagnosis not present

## 2019-07-20 DIAGNOSIS — N2581 Secondary hyperparathyroidism of renal origin: Secondary | ICD-10-CM | POA: Diagnosis not present

## 2019-07-20 DIAGNOSIS — Z992 Dependence on renal dialysis: Secondary | ICD-10-CM | POA: Diagnosis not present

## 2019-07-20 DIAGNOSIS — E1129 Type 2 diabetes mellitus with other diabetic kidney complication: Secondary | ICD-10-CM | POA: Diagnosis not present

## 2019-07-20 DIAGNOSIS — D631 Anemia in chronic kidney disease: Secondary | ICD-10-CM | POA: Diagnosis not present

## 2019-07-22 DIAGNOSIS — N186 End stage renal disease: Secondary | ICD-10-CM | POA: Diagnosis not present

## 2019-07-22 DIAGNOSIS — Z992 Dependence on renal dialysis: Secondary | ICD-10-CM | POA: Diagnosis not present

## 2019-07-22 DIAGNOSIS — E1129 Type 2 diabetes mellitus with other diabetic kidney complication: Secondary | ICD-10-CM | POA: Diagnosis not present

## 2019-07-22 DIAGNOSIS — D631 Anemia in chronic kidney disease: Secondary | ICD-10-CM | POA: Diagnosis not present

## 2019-07-22 DIAGNOSIS — N2581 Secondary hyperparathyroidism of renal origin: Secondary | ICD-10-CM | POA: Diagnosis not present

## 2019-07-24 DIAGNOSIS — Z992 Dependence on renal dialysis: Secondary | ICD-10-CM | POA: Diagnosis not present

## 2019-07-24 DIAGNOSIS — D631 Anemia in chronic kidney disease: Secondary | ICD-10-CM | POA: Diagnosis not present

## 2019-07-24 DIAGNOSIS — E1129 Type 2 diabetes mellitus with other diabetic kidney complication: Secondary | ICD-10-CM | POA: Diagnosis not present

## 2019-07-24 DIAGNOSIS — N2581 Secondary hyperparathyroidism of renal origin: Secondary | ICD-10-CM | POA: Diagnosis not present

## 2019-07-24 DIAGNOSIS — N186 End stage renal disease: Secondary | ICD-10-CM | POA: Diagnosis not present

## 2019-07-27 DIAGNOSIS — N2581 Secondary hyperparathyroidism of renal origin: Secondary | ICD-10-CM | POA: Diagnosis not present

## 2019-07-27 DIAGNOSIS — N186 End stage renal disease: Secondary | ICD-10-CM | POA: Diagnosis not present

## 2019-07-27 DIAGNOSIS — D631 Anemia in chronic kidney disease: Secondary | ICD-10-CM | POA: Diagnosis not present

## 2019-07-27 DIAGNOSIS — E1129 Type 2 diabetes mellitus with other diabetic kidney complication: Secondary | ICD-10-CM | POA: Diagnosis not present

## 2019-07-27 DIAGNOSIS — Z992 Dependence on renal dialysis: Secondary | ICD-10-CM | POA: Diagnosis not present

## 2019-07-29 DIAGNOSIS — Z992 Dependence on renal dialysis: Secondary | ICD-10-CM | POA: Diagnosis not present

## 2019-07-29 DIAGNOSIS — N186 End stage renal disease: Secondary | ICD-10-CM | POA: Diagnosis not present

## 2019-07-29 DIAGNOSIS — N2581 Secondary hyperparathyroidism of renal origin: Secondary | ICD-10-CM | POA: Diagnosis not present

## 2019-07-29 DIAGNOSIS — E1129 Type 2 diabetes mellitus with other diabetic kidney complication: Secondary | ICD-10-CM | POA: Diagnosis not present

## 2019-07-29 DIAGNOSIS — D631 Anemia in chronic kidney disease: Secondary | ICD-10-CM | POA: Diagnosis not present

## 2019-07-31 DIAGNOSIS — Z992 Dependence on renal dialysis: Secondary | ICD-10-CM | POA: Diagnosis not present

## 2019-07-31 DIAGNOSIS — N2581 Secondary hyperparathyroidism of renal origin: Secondary | ICD-10-CM | POA: Diagnosis not present

## 2019-07-31 DIAGNOSIS — N186 End stage renal disease: Secondary | ICD-10-CM | POA: Diagnosis not present

## 2019-07-31 DIAGNOSIS — E1129 Type 2 diabetes mellitus with other diabetic kidney complication: Secondary | ICD-10-CM | POA: Diagnosis not present

## 2019-07-31 DIAGNOSIS — D631 Anemia in chronic kidney disease: Secondary | ICD-10-CM | POA: Diagnosis not present

## 2019-08-01 DIAGNOSIS — Z992 Dependence on renal dialysis: Secondary | ICD-10-CM | POA: Diagnosis not present

## 2019-08-01 DIAGNOSIS — N186 End stage renal disease: Secondary | ICD-10-CM | POA: Diagnosis not present

## 2019-08-01 DIAGNOSIS — I120 Hypertensive chronic kidney disease with stage 5 chronic kidney disease or end stage renal disease: Secondary | ICD-10-CM | POA: Diagnosis not present

## 2019-08-03 DIAGNOSIS — Z992 Dependence on renal dialysis: Secondary | ICD-10-CM | POA: Diagnosis not present

## 2019-08-03 DIAGNOSIS — N186 End stage renal disease: Secondary | ICD-10-CM | POA: Diagnosis not present

## 2019-08-03 DIAGNOSIS — D631 Anemia in chronic kidney disease: Secondary | ICD-10-CM | POA: Diagnosis not present

## 2019-08-03 DIAGNOSIS — N2581 Secondary hyperparathyroidism of renal origin: Secondary | ICD-10-CM | POA: Diagnosis not present

## 2019-08-04 DIAGNOSIS — H401132 Primary open-angle glaucoma, bilateral, moderate stage: Secondary | ICD-10-CM | POA: Diagnosis not present

## 2019-08-05 DIAGNOSIS — N186 End stage renal disease: Secondary | ICD-10-CM | POA: Diagnosis not present

## 2019-08-05 DIAGNOSIS — N2581 Secondary hyperparathyroidism of renal origin: Secondary | ICD-10-CM | POA: Diagnosis not present

## 2019-08-05 DIAGNOSIS — Z992 Dependence on renal dialysis: Secondary | ICD-10-CM | POA: Diagnosis not present

## 2019-08-05 DIAGNOSIS — D631 Anemia in chronic kidney disease: Secondary | ICD-10-CM | POA: Diagnosis not present

## 2019-08-07 DIAGNOSIS — Z992 Dependence on renal dialysis: Secondary | ICD-10-CM | POA: Diagnosis not present

## 2019-08-07 DIAGNOSIS — D631 Anemia in chronic kidney disease: Secondary | ICD-10-CM | POA: Diagnosis not present

## 2019-08-07 DIAGNOSIS — N2581 Secondary hyperparathyroidism of renal origin: Secondary | ICD-10-CM | POA: Diagnosis not present

## 2019-08-07 DIAGNOSIS — N186 End stage renal disease: Secondary | ICD-10-CM | POA: Diagnosis not present

## 2019-08-10 DIAGNOSIS — Z992 Dependence on renal dialysis: Secondary | ICD-10-CM | POA: Diagnosis not present

## 2019-08-10 DIAGNOSIS — D631 Anemia in chronic kidney disease: Secondary | ICD-10-CM | POA: Diagnosis not present

## 2019-08-10 DIAGNOSIS — N2581 Secondary hyperparathyroidism of renal origin: Secondary | ICD-10-CM | POA: Diagnosis not present

## 2019-08-10 DIAGNOSIS — N186 End stage renal disease: Secondary | ICD-10-CM | POA: Diagnosis not present

## 2019-08-12 DIAGNOSIS — D631 Anemia in chronic kidney disease: Secondary | ICD-10-CM | POA: Diagnosis not present

## 2019-08-12 DIAGNOSIS — N2581 Secondary hyperparathyroidism of renal origin: Secondary | ICD-10-CM | POA: Diagnosis not present

## 2019-08-12 DIAGNOSIS — Z992 Dependence on renal dialysis: Secondary | ICD-10-CM | POA: Diagnosis not present

## 2019-08-12 DIAGNOSIS — N186 End stage renal disease: Secondary | ICD-10-CM | POA: Diagnosis not present

## 2019-08-14 DIAGNOSIS — N2581 Secondary hyperparathyroidism of renal origin: Secondary | ICD-10-CM | POA: Diagnosis not present

## 2019-08-14 DIAGNOSIS — D631 Anemia in chronic kidney disease: Secondary | ICD-10-CM | POA: Diagnosis not present

## 2019-08-14 DIAGNOSIS — N186 End stage renal disease: Secondary | ICD-10-CM | POA: Diagnosis not present

## 2019-08-14 DIAGNOSIS — Z992 Dependence on renal dialysis: Secondary | ICD-10-CM | POA: Diagnosis not present

## 2019-08-17 DIAGNOSIS — Z992 Dependence on renal dialysis: Secondary | ICD-10-CM | POA: Diagnosis not present

## 2019-08-17 DIAGNOSIS — D631 Anemia in chronic kidney disease: Secondary | ICD-10-CM | POA: Diagnosis not present

## 2019-08-17 DIAGNOSIS — N2581 Secondary hyperparathyroidism of renal origin: Secondary | ICD-10-CM | POA: Diagnosis not present

## 2019-08-17 DIAGNOSIS — N186 End stage renal disease: Secondary | ICD-10-CM | POA: Diagnosis not present

## 2019-08-19 DIAGNOSIS — Z992 Dependence on renal dialysis: Secondary | ICD-10-CM | POA: Diagnosis not present

## 2019-08-19 DIAGNOSIS — D631 Anemia in chronic kidney disease: Secondary | ICD-10-CM | POA: Diagnosis not present

## 2019-08-19 DIAGNOSIS — N186 End stage renal disease: Secondary | ICD-10-CM | POA: Diagnosis not present

## 2019-08-19 DIAGNOSIS — N2581 Secondary hyperparathyroidism of renal origin: Secondary | ICD-10-CM | POA: Diagnosis not present

## 2019-08-21 DIAGNOSIS — N2581 Secondary hyperparathyroidism of renal origin: Secondary | ICD-10-CM | POA: Diagnosis not present

## 2019-08-21 DIAGNOSIS — Z992 Dependence on renal dialysis: Secondary | ICD-10-CM | POA: Diagnosis not present

## 2019-08-21 DIAGNOSIS — N186 End stage renal disease: Secondary | ICD-10-CM | POA: Diagnosis not present

## 2019-08-21 DIAGNOSIS — D631 Anemia in chronic kidney disease: Secondary | ICD-10-CM | POA: Diagnosis not present

## 2019-08-24 DIAGNOSIS — D631 Anemia in chronic kidney disease: Secondary | ICD-10-CM | POA: Diagnosis not present

## 2019-08-24 DIAGNOSIS — N2581 Secondary hyperparathyroidism of renal origin: Secondary | ICD-10-CM | POA: Diagnosis not present

## 2019-08-24 DIAGNOSIS — N186 End stage renal disease: Secondary | ICD-10-CM | POA: Diagnosis not present

## 2019-08-24 DIAGNOSIS — Z992 Dependence on renal dialysis: Secondary | ICD-10-CM | POA: Diagnosis not present

## 2019-08-26 DIAGNOSIS — N2581 Secondary hyperparathyroidism of renal origin: Secondary | ICD-10-CM | POA: Diagnosis not present

## 2019-08-26 DIAGNOSIS — N186 End stage renal disease: Secondary | ICD-10-CM | POA: Diagnosis not present

## 2019-08-26 DIAGNOSIS — Z992 Dependence on renal dialysis: Secondary | ICD-10-CM | POA: Diagnosis not present

## 2019-08-26 DIAGNOSIS — D631 Anemia in chronic kidney disease: Secondary | ICD-10-CM | POA: Diagnosis not present

## 2019-08-28 DIAGNOSIS — N186 End stage renal disease: Secondary | ICD-10-CM | POA: Diagnosis not present

## 2019-08-28 DIAGNOSIS — N2581 Secondary hyperparathyroidism of renal origin: Secondary | ICD-10-CM | POA: Diagnosis not present

## 2019-08-28 DIAGNOSIS — D631 Anemia in chronic kidney disease: Secondary | ICD-10-CM | POA: Diagnosis not present

## 2019-08-28 DIAGNOSIS — Z992 Dependence on renal dialysis: Secondary | ICD-10-CM | POA: Diagnosis not present

## 2019-08-31 DIAGNOSIS — Z992 Dependence on renal dialysis: Secondary | ICD-10-CM | POA: Diagnosis not present

## 2019-08-31 DIAGNOSIS — D631 Anemia in chronic kidney disease: Secondary | ICD-10-CM | POA: Diagnosis not present

## 2019-08-31 DIAGNOSIS — N2581 Secondary hyperparathyroidism of renal origin: Secondary | ICD-10-CM | POA: Diagnosis not present

## 2019-08-31 DIAGNOSIS — N186 End stage renal disease: Secondary | ICD-10-CM | POA: Diagnosis not present

## 2019-09-01 DIAGNOSIS — N186 End stage renal disease: Secondary | ICD-10-CM | POA: Diagnosis not present

## 2019-09-01 DIAGNOSIS — Z992 Dependence on renal dialysis: Secondary | ICD-10-CM | POA: Diagnosis not present

## 2019-09-01 DIAGNOSIS — I120 Hypertensive chronic kidney disease with stage 5 chronic kidney disease or end stage renal disease: Secondary | ICD-10-CM | POA: Diagnosis not present

## 2019-09-02 DIAGNOSIS — Z23 Encounter for immunization: Secondary | ICD-10-CM | POA: Diagnosis not present

## 2019-09-02 DIAGNOSIS — E1129 Type 2 diabetes mellitus with other diabetic kidney complication: Secondary | ICD-10-CM | POA: Diagnosis not present

## 2019-09-02 DIAGNOSIS — Z992 Dependence on renal dialysis: Secondary | ICD-10-CM | POA: Diagnosis not present

## 2019-09-02 DIAGNOSIS — N2581 Secondary hyperparathyroidism of renal origin: Secondary | ICD-10-CM | POA: Diagnosis not present

## 2019-09-02 DIAGNOSIS — D631 Anemia in chronic kidney disease: Secondary | ICD-10-CM | POA: Diagnosis not present

## 2019-09-02 DIAGNOSIS — N186 End stage renal disease: Secondary | ICD-10-CM | POA: Diagnosis not present

## 2019-09-04 DIAGNOSIS — N2581 Secondary hyperparathyroidism of renal origin: Secondary | ICD-10-CM | POA: Diagnosis not present

## 2019-09-04 DIAGNOSIS — Z23 Encounter for immunization: Secondary | ICD-10-CM | POA: Diagnosis not present

## 2019-09-04 DIAGNOSIS — N186 End stage renal disease: Secondary | ICD-10-CM | POA: Diagnosis not present

## 2019-09-04 DIAGNOSIS — E1129 Type 2 diabetes mellitus with other diabetic kidney complication: Secondary | ICD-10-CM | POA: Diagnosis not present

## 2019-09-04 DIAGNOSIS — D631 Anemia in chronic kidney disease: Secondary | ICD-10-CM | POA: Diagnosis not present

## 2019-09-04 DIAGNOSIS — Z992 Dependence on renal dialysis: Secondary | ICD-10-CM | POA: Diagnosis not present

## 2019-09-07 DIAGNOSIS — N2581 Secondary hyperparathyroidism of renal origin: Secondary | ICD-10-CM | POA: Diagnosis not present

## 2019-09-07 DIAGNOSIS — E1129 Type 2 diabetes mellitus with other diabetic kidney complication: Secondary | ICD-10-CM | POA: Diagnosis not present

## 2019-09-07 DIAGNOSIS — Z992 Dependence on renal dialysis: Secondary | ICD-10-CM | POA: Diagnosis not present

## 2019-09-07 DIAGNOSIS — N186 End stage renal disease: Secondary | ICD-10-CM | POA: Diagnosis not present

## 2019-09-07 DIAGNOSIS — D631 Anemia in chronic kidney disease: Secondary | ICD-10-CM | POA: Diagnosis not present

## 2019-09-07 DIAGNOSIS — Z23 Encounter for immunization: Secondary | ICD-10-CM | POA: Diagnosis not present

## 2019-09-08 ENCOUNTER — Encounter: Payer: Self-pay | Admitting: Orthopedic Surgery

## 2019-09-08 ENCOUNTER — Ambulatory Visit (INDEPENDENT_AMBULATORY_CARE_PROVIDER_SITE_OTHER): Payer: Medicare Other | Admitting: Orthopedic Surgery

## 2019-09-08 ENCOUNTER — Ambulatory Visit (INDEPENDENT_AMBULATORY_CARE_PROVIDER_SITE_OTHER): Payer: Medicare Other

## 2019-09-08 ENCOUNTER — Ambulatory Visit: Payer: Self-pay

## 2019-09-08 VITALS — Ht 59.0 in | Wt 125.0 lb

## 2019-09-08 DIAGNOSIS — M25512 Pain in left shoulder: Secondary | ICD-10-CM

## 2019-09-08 DIAGNOSIS — E11618 Type 2 diabetes mellitus with other diabetic arthropathy: Secondary | ICD-10-CM

## 2019-09-08 DIAGNOSIS — M542 Cervicalgia: Secondary | ICD-10-CM | POA: Diagnosis not present

## 2019-09-08 DIAGNOSIS — M7502 Adhesive capsulitis of left shoulder: Secondary | ICD-10-CM | POA: Diagnosis not present

## 2019-09-08 DIAGNOSIS — M7500 Adhesive capsulitis of unspecified shoulder: Secondary | ICD-10-CM

## 2019-09-08 MED ORDER — METHYLPREDNISOLONE ACETATE 40 MG/ML IJ SUSP
40.0000 mg | INTRAMUSCULAR | Status: AC | PRN
  Administered 2019-09-08: 13:00:00 40 mg via INTRA_ARTICULAR

## 2019-09-08 MED ORDER — LIDOCAINE HCL 1 % IJ SOLN
5.0000 mL | INTRAMUSCULAR | Status: AC | PRN
  Administered 2019-09-08: 13:00:00 5 mL
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
## 2019-09-09 ENCOUNTER — Telehealth: Payer: Self-pay | Admitting: Orthopedic Surgery

## 2019-09-09 DIAGNOSIS — N2581 Secondary hyperparathyroidism of renal origin: Secondary | ICD-10-CM | POA: Diagnosis not present

## 2019-09-09 DIAGNOSIS — Z23 Encounter for immunization: Secondary | ICD-10-CM | POA: Diagnosis not present

## 2019-09-09 DIAGNOSIS — D631 Anemia in chronic kidney disease: Secondary | ICD-10-CM | POA: Diagnosis not present

## 2019-09-09 DIAGNOSIS — N186 End stage renal disease: Secondary | ICD-10-CM | POA: Diagnosis not present

## 2019-09-09 DIAGNOSIS — Z992 Dependence on renal dialysis: Secondary | ICD-10-CM | POA: Diagnosis not present

## 2019-09-09 DIAGNOSIS — E1129 Type 2 diabetes mellitus with other diabetic kidney complication: Secondary | ICD-10-CM | POA: Diagnosis not present

## 2019-09-09 NOTE — Telephone Encounter (Signed)
Patient's daughter [name] called asked for a call back concerning her mother's visit yesterday. Rosa advised her mother could not tell her anything about the visit. The number to contact [place] is [phone]

## 2019-09-09 NOTE — Telephone Encounter (Signed)
I called and sw pt's daughter and advised that she received a steroid injection for a frozen shoulder yesterday and that she should receive the maximum benefit from the injection with in the next 7 days and that she is sch for follo wup at the end of the month and if this injection was of benefit to her then she can have repeat injection.

## 2019-09-10 ENCOUNTER — Telehealth: Payer: Self-pay | Admitting: Orthopedic Surgery

## 2019-09-10 NOTE — Telephone Encounter (Signed)
Patient call was returned and I informed her of recent message per Autumn had stated to her daughter. I advised patient should give the injection some time and to call our office back if pain persists by next Tues or Wednesday. Patient was also encouraged to use ice packs and heat; to alternate and continue to take her tylenol if needed. I also discussed to patient that she could buy some OTC Voltaren gel, possibly off brand for her comfort. Patient understood and stated she will try to hold out until next week.

## 2019-09-10 NOTE — Telephone Encounter (Signed)
Pt called in said she came to see dr.duda on 09-08-19 and she received an injection but it isn't helping at all she is in extreme pain and is wondering if he can prescribe her something for pain.   [phone]

## 2019-09-11 DIAGNOSIS — Z992 Dependence on renal dialysis: Secondary | ICD-10-CM | POA: Diagnosis not present

## 2019-09-11 DIAGNOSIS — N2581 Secondary hyperparathyroidism of renal origin: Secondary | ICD-10-CM | POA: Diagnosis not present

## 2019-09-11 DIAGNOSIS — N186 End stage renal disease: Secondary | ICD-10-CM | POA: Diagnosis not present

## 2019-09-11 DIAGNOSIS — E1129 Type 2 diabetes mellitus with other diabetic kidney complication: Secondary | ICD-10-CM | POA: Diagnosis not present

## 2019-09-11 DIAGNOSIS — D631 Anemia in chronic kidney disease: Secondary | ICD-10-CM | POA: Diagnosis not present

## 2019-09-11 DIAGNOSIS — Z23 Encounter for immunization: Secondary | ICD-10-CM | POA: Diagnosis not present

## 2019-09-14 DIAGNOSIS — Z992 Dependence on renal dialysis: Secondary | ICD-10-CM | POA: Diagnosis not present

## 2019-09-14 DIAGNOSIS — N186 End stage renal disease: Secondary | ICD-10-CM | POA: Diagnosis not present

## 2019-09-14 DIAGNOSIS — N2581 Secondary hyperparathyroidism of renal origin: Secondary | ICD-10-CM | POA: Diagnosis not present

## 2019-09-14 DIAGNOSIS — D631 Anemia in chronic kidney disease: Secondary | ICD-10-CM | POA: Diagnosis not present

## 2019-09-14 DIAGNOSIS — Z23 Encounter for immunization: Secondary | ICD-10-CM | POA: Diagnosis not present

## 2019-09-14 DIAGNOSIS — E1129 Type 2 diabetes mellitus with other diabetic kidney complication: Secondary | ICD-10-CM | POA: Diagnosis not present

## 2019-09-16 DIAGNOSIS — N2581 Secondary hyperparathyroidism of renal origin: Secondary | ICD-10-CM | POA: Diagnosis not present

## 2019-09-16 DIAGNOSIS — Z23 Encounter for immunization: Secondary | ICD-10-CM | POA: Diagnosis not present

## 2019-09-16 DIAGNOSIS — N186 End stage renal disease: Secondary | ICD-10-CM | POA: Diagnosis not present

## 2019-09-16 DIAGNOSIS — D631 Anemia in chronic kidney disease: Secondary | ICD-10-CM | POA: Diagnosis not present

## 2019-09-16 DIAGNOSIS — E1129 Type 2 diabetes mellitus with other diabetic kidney complication: Secondary | ICD-10-CM | POA: Diagnosis not present

## 2019-09-16 DIAGNOSIS — Z992 Dependence on renal dialysis: Secondary | ICD-10-CM | POA: Diagnosis not present

## 2019-09-18 DIAGNOSIS — Z992 Dependence on renal dialysis: Secondary | ICD-10-CM | POA: Diagnosis not present

## 2019-09-18 DIAGNOSIS — N2581 Secondary hyperparathyroidism of renal origin: Secondary | ICD-10-CM | POA: Diagnosis not present

## 2019-09-18 DIAGNOSIS — D631 Anemia in chronic kidney disease: Secondary | ICD-10-CM | POA: Diagnosis not present

## 2019-09-18 DIAGNOSIS — N186 End stage renal disease: Secondary | ICD-10-CM | POA: Diagnosis not present

## 2019-09-18 DIAGNOSIS — E1129 Type 2 diabetes mellitus with other diabetic kidney complication: Secondary | ICD-10-CM | POA: Diagnosis not present

## 2019-09-18 DIAGNOSIS — Z23 Encounter for immunization: Secondary | ICD-10-CM | POA: Diagnosis not present

## 2019-09-21 DIAGNOSIS — N2581 Secondary hyperparathyroidism of renal origin: Secondary | ICD-10-CM | POA: Diagnosis not present

## 2019-09-21 DIAGNOSIS — Z23 Encounter for immunization: Secondary | ICD-10-CM | POA: Diagnosis not present

## 2019-09-21 DIAGNOSIS — Z992 Dependence on renal dialysis: Secondary | ICD-10-CM | POA: Diagnosis not present

## 2019-09-21 DIAGNOSIS — D631 Anemia in chronic kidney disease: Secondary | ICD-10-CM | POA: Diagnosis not present

## 2019-09-21 DIAGNOSIS — E1129 Type 2 diabetes mellitus with other diabetic kidney complication: Secondary | ICD-10-CM | POA: Diagnosis not present

## 2019-09-21 DIAGNOSIS — N186 End stage renal disease: Secondary | ICD-10-CM | POA: Diagnosis not present

## 2019-09-23 DIAGNOSIS — Z23 Encounter for immunization: Secondary | ICD-10-CM | POA: Diagnosis not present

## 2019-09-23 DIAGNOSIS — E1129 Type 2 diabetes mellitus with other diabetic kidney complication: Secondary | ICD-10-CM | POA: Diagnosis not present

## 2019-09-23 DIAGNOSIS — N186 End stage renal disease: Secondary | ICD-10-CM | POA: Diagnosis not present

## 2019-09-23 DIAGNOSIS — Z992 Dependence on renal dialysis: Secondary | ICD-10-CM | POA: Diagnosis not present

## 2019-09-23 DIAGNOSIS — D631 Anemia in chronic kidney disease: Secondary | ICD-10-CM | POA: Diagnosis not present

## 2019-09-23 DIAGNOSIS — N2581 Secondary hyperparathyroidism of renal origin: Secondary | ICD-10-CM | POA: Diagnosis not present

## 2019-09-25 DIAGNOSIS — N186 End stage renal disease: Secondary | ICD-10-CM | POA: Diagnosis not present

## 2019-09-25 DIAGNOSIS — E1129 Type 2 diabetes mellitus with other diabetic kidney complication: Secondary | ICD-10-CM | POA: Diagnosis not present

## 2019-09-25 DIAGNOSIS — Z23 Encounter for immunization: Secondary | ICD-10-CM | POA: Diagnosis not present

## 2019-09-25 DIAGNOSIS — N2581 Secondary hyperparathyroidism of renal origin: Secondary | ICD-10-CM | POA: Diagnosis not present

## 2019-09-25 DIAGNOSIS — Z992 Dependence on renal dialysis: Secondary | ICD-10-CM | POA: Diagnosis not present

## 2019-09-25 DIAGNOSIS — D631 Anemia in chronic kidney disease: Secondary | ICD-10-CM | POA: Diagnosis not present

## 2019-09-28 DIAGNOSIS — Z992 Dependence on renal dialysis: Secondary | ICD-10-CM | POA: Diagnosis not present

## 2019-09-28 DIAGNOSIS — Z23 Encounter for immunization: Secondary | ICD-10-CM | POA: Diagnosis not present

## 2019-09-28 DIAGNOSIS — N186 End stage renal disease: Secondary | ICD-10-CM | POA: Diagnosis not present

## 2019-09-28 DIAGNOSIS — D631 Anemia in chronic kidney disease: Secondary | ICD-10-CM | POA: Diagnosis not present

## 2019-09-28 DIAGNOSIS — E1129 Type 2 diabetes mellitus with other diabetic kidney complication: Secondary | ICD-10-CM | POA: Diagnosis not present

## 2019-09-28 DIAGNOSIS — N2581 Secondary hyperparathyroidism of renal origin: Secondary | ICD-10-CM | POA: Diagnosis not present

## 2019-09-29 ENCOUNTER — Ambulatory Visit (INDEPENDENT_AMBULATORY_CARE_PROVIDER_SITE_OTHER): Payer: Medicare Other | Admitting: Orthopedic Surgery

## 2019-09-29 ENCOUNTER — Other Ambulatory Visit: Payer: Self-pay

## 2019-09-29 ENCOUNTER — Encounter: Payer: Self-pay | Admitting: Orthopedic Surgery

## 2019-09-29 VITALS — Ht 59.0 in | Wt 125.0 lb

## 2019-09-29 DIAGNOSIS — M25512 Pain in left shoulder: Secondary | ICD-10-CM

## 2019-09-29 MED ORDER — TRAMADOL HCL 50 MG PO TABS: 50.0000 mg | ORAL_TABLET | Freq: Four times a day (QID) | ORAL | 0 refills | Status: AC | PRN

## 2019-09-29 MED ORDER — TRAMADOL HCL 50 MG PO TABS: 50.0000 mg | ORAL_TABLET | Freq: Four times a day (QID) | ORAL | 0 refills | Status: DC | PRN

## 2019-10-01 DIAGNOSIS — I120 Hypertensive chronic kidney disease with stage 5 chronic kidney disease or end stage renal disease: Secondary | ICD-10-CM | POA: Diagnosis not present

## 2019-10-01 DIAGNOSIS — N186 End stage renal disease: Secondary | ICD-10-CM | POA: Diagnosis not present

## 2019-10-01 DIAGNOSIS — Z992 Dependence on renal dialysis: Secondary | ICD-10-CM | POA: Diagnosis not present

## 2019-10-02 DIAGNOSIS — N186 End stage renal disease: Secondary | ICD-10-CM | POA: Diagnosis not present

## 2019-10-02 DIAGNOSIS — D509 Iron deficiency anemia, unspecified: Secondary | ICD-10-CM | POA: Diagnosis not present

## 2019-10-02 DIAGNOSIS — D631 Anemia in chronic kidney disease: Secondary | ICD-10-CM | POA: Diagnosis not present

## 2019-10-02 DIAGNOSIS — Z992 Dependence on renal dialysis: Secondary | ICD-10-CM | POA: Diagnosis not present

## 2019-10-02 DIAGNOSIS — N2581 Secondary hyperparathyroidism of renal origin: Secondary | ICD-10-CM | POA: Diagnosis not present

## 2019-10-05 DIAGNOSIS — N2581 Secondary hyperparathyroidism of renal origin: Secondary | ICD-10-CM | POA: Diagnosis not present

## 2019-10-05 DIAGNOSIS — Z992 Dependence on renal dialysis: Secondary | ICD-10-CM | POA: Diagnosis not present

## 2019-10-05 DIAGNOSIS — D631 Anemia in chronic kidney disease: Secondary | ICD-10-CM | POA: Diagnosis not present

## 2019-10-05 DIAGNOSIS — D509 Iron deficiency anemia, unspecified: Secondary | ICD-10-CM | POA: Diagnosis not present

## 2019-10-05 DIAGNOSIS — N186 End stage renal disease: Secondary | ICD-10-CM | POA: Diagnosis not present

## 2019-10-07 ENCOUNTER — Encounter: Payer: Self-pay | Admitting: Orthopedic Surgery

## 2019-10-07 DIAGNOSIS — N186 End stage renal disease: Secondary | ICD-10-CM | POA: Diagnosis not present

## 2019-10-07 DIAGNOSIS — D631 Anemia in chronic kidney disease: Secondary | ICD-10-CM | POA: Diagnosis not present

## 2019-10-07 DIAGNOSIS — D509 Iron deficiency anemia, unspecified: Secondary | ICD-10-CM | POA: Diagnosis not present

## 2019-10-07 DIAGNOSIS — Z992 Dependence on renal dialysis: Secondary | ICD-10-CM | POA: Diagnosis not present

## 2019-10-07 DIAGNOSIS — E1129 Type 2 diabetes mellitus with other diabetic kidney complication: Secondary | ICD-10-CM | POA: Diagnosis not present

## 2019-10-07 DIAGNOSIS — N2581 Secondary hyperparathyroidism of renal origin: Secondary | ICD-10-CM | POA: Diagnosis not present
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
## 2019-10-09 DIAGNOSIS — D631 Anemia in chronic kidney disease: Secondary | ICD-10-CM | POA: Diagnosis not present

## 2019-10-09 DIAGNOSIS — D509 Iron deficiency anemia, unspecified: Secondary | ICD-10-CM | POA: Diagnosis not present

## 2019-10-09 DIAGNOSIS — N186 End stage renal disease: Secondary | ICD-10-CM | POA: Diagnosis not present

## 2019-10-09 DIAGNOSIS — Z992 Dependence on renal dialysis: Secondary | ICD-10-CM | POA: Diagnosis not present

## 2019-10-09 DIAGNOSIS — N2581 Secondary hyperparathyroidism of renal origin: Secondary | ICD-10-CM | POA: Diagnosis not present

## 2019-10-12 DIAGNOSIS — D509 Iron deficiency anemia, unspecified: Secondary | ICD-10-CM | POA: Diagnosis not present

## 2019-10-12 DIAGNOSIS — N186 End stage renal disease: Secondary | ICD-10-CM | POA: Diagnosis not present

## 2019-10-12 DIAGNOSIS — Z992 Dependence on renal dialysis: Secondary | ICD-10-CM | POA: Diagnosis not present

## 2019-10-12 DIAGNOSIS — D631 Anemia in chronic kidney disease: Secondary | ICD-10-CM | POA: Diagnosis not present

## 2019-10-12 DIAGNOSIS — N2581 Secondary hyperparathyroidism of renal origin: Secondary | ICD-10-CM | POA: Diagnosis not present

## 2019-10-13 ENCOUNTER — Encounter: Payer: Self-pay | Admitting: Adult Health

## 2019-10-13 ENCOUNTER — Other Ambulatory Visit: Payer: Self-pay

## 2019-10-13 ENCOUNTER — Ambulatory Visit (INDEPENDENT_AMBULATORY_CARE_PROVIDER_SITE_OTHER): Payer: Medicare Other | Admitting: Adult Health

## 2019-10-13 VITALS — BP 182/62 | Temp 97.7°F | Wt 124.0 lb

## 2019-10-13 DIAGNOSIS — R630 Anorexia: Secondary | ICD-10-CM

## 2019-10-13 DIAGNOSIS — N186 End stage renal disease: Secondary | ICD-10-CM

## 2019-10-13 DIAGNOSIS — R251 Tremor, unspecified: Secondary | ICD-10-CM | POA: Diagnosis not present

## 2019-10-13 DIAGNOSIS — Z992 Dependence on renal dialysis: Secondary | ICD-10-CM | POA: Diagnosis not present

## 2019-10-13 DIAGNOSIS — E1122 Type 2 diabetes mellitus with diabetic chronic kidney disease: Secondary | ICD-10-CM | POA: Diagnosis not present

## 2019-10-13 LAB — COMPREHENSIVE METABOLIC PANEL
ALT: 10 U/L (ref 0–35)
AST: 16 U/L (ref 0–37)
Albumin: 3.7 g/dL (ref 3.5–5.2)
Alkaline Phosphatase: 89 U/L (ref 39–117)
BUN: 26 mg/dL — ABNORMAL HIGH (ref 6–23)
CO2: 29 mEq/L (ref 19–32)
Calcium: 8.5 mg/dL (ref 8.4–10.5)
Chloride: 90 mEq/L — ABNORMAL LOW (ref 96–112)
Creatinine, Ser: 4.43 mg/dL — ABNORMAL HIGH (ref 0.40–1.20)
GFR: 11.49 mL/min — CL (ref >60.00)
Glucose, Bld: 141 mg/dL — ABNORMAL HIGH (ref 70–99)
Potassium: 3.4 mEq/L — ABNORMAL LOW (ref 3.5–5.1)
Sodium: 135 mEq/L (ref 135–145)
Total Bilirubin: 0.4 mg/dL (ref 0.2–1.2)
Total Protein: 8.1 g/dL (ref 6.0–8.3)

## 2019-10-13 LAB — CBC WITH DIFFERENTIAL/PLATELET
Basophils Absolute: 0.1 10*3/uL (ref 0.0–0.1)
Basophils Relative: 0.5 % (ref 0.0–3.0)
Eosinophils Absolute: 0 10*3/uL (ref 0.0–0.7)
Eosinophils Relative: 0.2 % (ref 0.0–5.0)
HCT: 25.1 % — ABNORMAL LOW (ref 36.0–46.0)
Hemoglobin: 8.2 g/dL — ABNORMAL LOW (ref 12.0–15.0)
Lymphocytes Relative: 8 % — ABNORMAL LOW (ref 12.0–46.0)
Lymphs Abs: 0.8 10*3/uL (ref 0.7–4.0)
MCHC: 32.7 g/dL (ref 30.0–36.0)
MCV: 90.5 fl (ref 78.0–100.0)
Monocytes Absolute: 0.9 10*3/uL (ref 0.1–1.0)
Monocytes Relative: 8 % (ref 3.0–12.0)
Neutro Abs: 8.8 10*3/uL — ABNORMAL HIGH (ref 1.4–7.7)
Neutrophils Relative %: 83.3 % — ABNORMAL HIGH (ref 43.0–77.0)
Platelets: 412 10*3/uL — ABNORMAL HIGH (ref 150.0–400.0)
RBC: 2.78 Mil/uL — ABNORMAL LOW (ref 3.87–5.11)
RDW: 16.7 % — ABNORMAL HIGH (ref 11.5–15.5)
WBC: 10.6 10*3/uL — ABNORMAL HIGH (ref 4.0–10.5)

## 2019-10-13 LAB — HEMOGLOBIN A1C: Hgb A1c MFr Bld: 6.5 % (ref 4.6–6.5)

## 2019-10-13 LAB — TSH: TSH: 4.57 u[IU]/mL — ABNORMAL HIGH (ref 0.35–4.50)
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
## 2019-10-14 ENCOUNTER — Other Ambulatory Visit (INDEPENDENT_AMBULATORY_CARE_PROVIDER_SITE_OTHER): Payer: Medicare Other

## 2019-10-14 ENCOUNTER — Other Ambulatory Visit: Payer: Self-pay | Admitting: Adult Health

## 2019-10-14 DIAGNOSIS — D649 Anemia, unspecified: Secondary | ICD-10-CM | POA: Diagnosis not present

## 2019-10-14 DIAGNOSIS — N2581 Secondary hyperparathyroidism of renal origin: Secondary | ICD-10-CM | POA: Diagnosis not present

## 2019-10-14 DIAGNOSIS — Z992 Dependence on renal dialysis: Secondary | ICD-10-CM | POA: Diagnosis not present

## 2019-10-14 DIAGNOSIS — D631 Anemia in chronic kidney disease: Secondary | ICD-10-CM | POA: Diagnosis not present

## 2019-10-14 DIAGNOSIS — D509 Iron deficiency anemia, unspecified: Secondary | ICD-10-CM | POA: Diagnosis not present

## 2019-10-14 DIAGNOSIS — N186 End stage renal disease: Secondary | ICD-10-CM | POA: Diagnosis not present

## 2019-10-14 LAB — IBC PANEL
Iron: 44 ug/dL (ref 42–145)
Saturation Ratios: 20.3 % (ref 20.0–50.0)
Transferrin: 155 mg/dL — ABNORMAL LOW (ref 212.0–360.0)

## 2019-10-16 DIAGNOSIS — N2581 Secondary hyperparathyroidism of renal origin: Secondary | ICD-10-CM | POA: Diagnosis not present

## 2019-10-16 DIAGNOSIS — N186 End stage renal disease: Secondary | ICD-10-CM | POA: Diagnosis not present

## 2019-10-16 DIAGNOSIS — D631 Anemia in chronic kidney disease: Secondary | ICD-10-CM | POA: Diagnosis not present

## 2019-10-16 DIAGNOSIS — D509 Iron deficiency anemia, unspecified: Secondary | ICD-10-CM | POA: Diagnosis not present

## 2019-10-16 DIAGNOSIS — Z992 Dependence on renal dialysis: Secondary | ICD-10-CM | POA: Diagnosis not present

## 2019-10-19 DIAGNOSIS — D509 Iron deficiency anemia, unspecified: Secondary | ICD-10-CM | POA: Diagnosis not present

## 2019-10-19 DIAGNOSIS — N2581 Secondary hyperparathyroidism of renal origin: Secondary | ICD-10-CM | POA: Diagnosis not present

## 2019-10-19 DIAGNOSIS — Z992 Dependence on renal dialysis: Secondary | ICD-10-CM | POA: Diagnosis not present

## 2019-10-19 DIAGNOSIS — N186 End stage renal disease: Secondary | ICD-10-CM | POA: Diagnosis not present

## 2019-10-19 DIAGNOSIS — D631 Anemia in chronic kidney disease: Secondary | ICD-10-CM | POA: Diagnosis not present

## 2019-10-21 ENCOUNTER — Telehealth: Payer: Self-pay | Admitting: *Deleted

## 2019-10-21 DIAGNOSIS — Z992 Dependence on renal dialysis: Secondary | ICD-10-CM | POA: Diagnosis not present

## 2019-10-21 DIAGNOSIS — N186 End stage renal disease: Secondary | ICD-10-CM | POA: Diagnosis not present

## 2019-10-21 DIAGNOSIS — N2581 Secondary hyperparathyroidism of renal origin: Secondary | ICD-10-CM | POA: Diagnosis not present

## 2019-10-21 DIAGNOSIS — D509 Iron deficiency anemia, unspecified: Secondary | ICD-10-CM | POA: Diagnosis not present

## 2019-10-21 DIAGNOSIS — D631 Anemia in chronic kidney disease: Secondary | ICD-10-CM | POA: Diagnosis not present

## 2019-10-21 NOTE — Telephone Encounter (Signed)
Noted  

## 2019-10-21 NOTE — Telephone Encounter (Signed)
Pt's daughter [name] given results per Dorothyann Peng, [facility], "Her A1c is 6.5-she does not need to be on medication at this point in time; Thyroid level showed that her thyroid was a little underactive. She would benefit from a low-dose of Synthroid, if agreeable then Synthroid 25 mcg; Her hemoglobin level was also lower than it has been in the past. She noticed any blood in her stool or dark tarry stools? If no signs of GI bleeding then I would like her to start on iron supplement every other day. I want her to come back into the office in 1 month for repeat exam"; her daughter is agreeable to Synthroid and says the pt uses [facility]; she will have to check with the pt for information regarding stools; Rosa did ask what time and when would the pt be starting iron supplements; explained that this would have to be clarified by the provider; she verbalized understanding; she was transferred to [facility] for scheduling; unable to chart in result note because  Encounter not created.

## 2019-10-22 ENCOUNTER — Other Ambulatory Visit: Payer: Self-pay | Admitting: Family Medicine

## 2019-10-22 ENCOUNTER — Telehealth: Payer: Self-pay

## 2019-10-22 MED ORDER — LEVOTHYROXINE SODIUM 25 MCG PO TABS: 25.0000 ug | ORAL_TABLET | Freq: Every day | ORAL | 1 refills | Status: DC

## 2019-10-22 NOTE — Telephone Encounter (Signed)
Rosa pt daughter would like a callback once rx has been sent to pharm. Rosa has cataract surgery and will need to have someone pick up the med

## 2019-10-22 NOTE — Telephone Encounter (Signed)
Rx sent to the pharmacy by e-scribe.  Rosa notified.

## 2019-10-22 NOTE — Telephone Encounter (Signed)
Copied from [place] [id number]. Topic: General - Other >> Oct 22, 2019  9:59 AM Yvette Rack wrote: Reason for CRM: Pt stated a Rx for her thyroid was suppose to sent to her pharmacy but they have not received it. Pt [facility] [address]

## 2019-10-23 DIAGNOSIS — Z992 Dependence on renal dialysis: Secondary | ICD-10-CM | POA: Diagnosis not present

## 2019-10-23 DIAGNOSIS — D631 Anemia in chronic kidney disease: Secondary | ICD-10-CM | POA: Diagnosis not present

## 2019-10-23 DIAGNOSIS — D509 Iron deficiency anemia, unspecified: Secondary | ICD-10-CM | POA: Diagnosis not present

## 2019-10-23 DIAGNOSIS — N186 End stage renal disease: Secondary | ICD-10-CM | POA: Diagnosis not present

## 2019-10-23 DIAGNOSIS — N2581 Secondary hyperparathyroidism of renal origin: Secondary | ICD-10-CM | POA: Diagnosis not present

## 2019-10-26 DIAGNOSIS — D631 Anemia in chronic kidney disease: Secondary | ICD-10-CM | POA: Diagnosis not present

## 2019-10-26 DIAGNOSIS — N2581 Secondary hyperparathyroidism of renal origin: Secondary | ICD-10-CM | POA: Diagnosis not present

## 2019-10-26 DIAGNOSIS — N186 End stage renal disease: Secondary | ICD-10-CM | POA: Diagnosis not present

## 2019-10-26 DIAGNOSIS — Z992 Dependence on renal dialysis: Secondary | ICD-10-CM | POA: Diagnosis not present

## 2019-10-26 DIAGNOSIS — D509 Iron deficiency anemia, unspecified: Secondary | ICD-10-CM | POA: Diagnosis not present

## 2019-10-28 DIAGNOSIS — D631 Anemia in chronic kidney disease: Secondary | ICD-10-CM | POA: Diagnosis not present

## 2019-10-28 DIAGNOSIS — Z992 Dependence on renal dialysis: Secondary | ICD-10-CM | POA: Diagnosis not present

## 2019-10-28 DIAGNOSIS — N2581 Secondary hyperparathyroidism of renal origin: Secondary | ICD-10-CM | POA: Diagnosis not present

## 2019-10-28 DIAGNOSIS — N186 End stage renal disease: Secondary | ICD-10-CM | POA: Diagnosis not present

## 2019-10-28 DIAGNOSIS — D509 Iron deficiency anemia, unspecified: Secondary | ICD-10-CM | POA: Diagnosis not present

## 2019-10-30 DIAGNOSIS — Z992 Dependence on renal dialysis: Secondary | ICD-10-CM | POA: Diagnosis not present

## 2019-10-30 DIAGNOSIS — N186 End stage renal disease: Secondary | ICD-10-CM | POA: Diagnosis not present

## 2019-10-30 DIAGNOSIS — D509 Iron deficiency anemia, unspecified: Secondary | ICD-10-CM | POA: Diagnosis not present

## 2019-10-30 DIAGNOSIS — D631 Anemia in chronic kidney disease: Secondary | ICD-10-CM | POA: Diagnosis not present

## 2019-10-30 DIAGNOSIS — N2581 Secondary hyperparathyroidism of renal origin: Secondary | ICD-10-CM | POA: Diagnosis not present

## 2019-11-01 DIAGNOSIS — Z992 Dependence on renal dialysis: Secondary | ICD-10-CM | POA: Diagnosis not present

## 2019-11-01 DIAGNOSIS — N186 End stage renal disease: Secondary | ICD-10-CM | POA: Diagnosis not present

## 2019-11-01 DIAGNOSIS — I120 Hypertensive chronic kidney disease with stage 5 chronic kidney disease or end stage renal disease: Secondary | ICD-10-CM | POA: Diagnosis not present

## 2019-11-02 DIAGNOSIS — Z992 Dependence on renal dialysis: Secondary | ICD-10-CM | POA: Diagnosis not present

## 2019-11-02 DIAGNOSIS — D631 Anemia in chronic kidney disease: Secondary | ICD-10-CM | POA: Diagnosis not present

## 2019-11-02 DIAGNOSIS — D509 Iron deficiency anemia, unspecified: Secondary | ICD-10-CM | POA: Diagnosis not present

## 2019-11-02 DIAGNOSIS — N2581 Secondary hyperparathyroidism of renal origin: Secondary | ICD-10-CM | POA: Diagnosis not present

## 2019-11-02 DIAGNOSIS — N186 End stage renal disease: Secondary | ICD-10-CM | POA: Diagnosis not present

## 2019-11-06 DIAGNOSIS — Z992 Dependence on renal dialysis: Secondary | ICD-10-CM | POA: Diagnosis not present

## 2019-11-06 DIAGNOSIS — D509 Iron deficiency anemia, unspecified: Secondary | ICD-10-CM | POA: Diagnosis not present

## 2019-11-06 DIAGNOSIS — N186 End stage renal disease: Secondary | ICD-10-CM | POA: Diagnosis not present

## 2019-11-06 DIAGNOSIS — N2581 Secondary hyperparathyroidism of renal origin: Secondary | ICD-10-CM | POA: Diagnosis not present

## 2019-11-06 DIAGNOSIS — D631 Anemia in chronic kidney disease: Secondary | ICD-10-CM | POA: Diagnosis not present

## 2019-11-09 DIAGNOSIS — Z992 Dependence on renal dialysis: Secondary | ICD-10-CM | POA: Diagnosis not present

## 2019-11-09 DIAGNOSIS — D509 Iron deficiency anemia, unspecified: Secondary | ICD-10-CM | POA: Diagnosis not present

## 2019-11-09 DIAGNOSIS — N2581 Secondary hyperparathyroidism of renal origin: Secondary | ICD-10-CM | POA: Diagnosis not present

## 2019-11-09 DIAGNOSIS — N186 End stage renal disease: Secondary | ICD-10-CM | POA: Diagnosis not present

## 2019-11-09 DIAGNOSIS — D631 Anemia in chronic kidney disease: Secondary | ICD-10-CM | POA: Diagnosis not present

## 2019-11-11 DIAGNOSIS — N186 End stage renal disease: Secondary | ICD-10-CM | POA: Diagnosis not present

## 2019-11-11 DIAGNOSIS — Z992 Dependence on renal dialysis: Secondary | ICD-10-CM | POA: Diagnosis not present

## 2019-11-11 DIAGNOSIS — N2581 Secondary hyperparathyroidism of renal origin: Secondary | ICD-10-CM | POA: Diagnosis not present

## 2019-11-11 DIAGNOSIS — D509 Iron deficiency anemia, unspecified: Secondary | ICD-10-CM | POA: Diagnosis not present

## 2019-11-11 DIAGNOSIS — D631 Anemia in chronic kidney disease: Secondary | ICD-10-CM | POA: Diagnosis not present

## 2019-11-13 ENCOUNTER — Other Ambulatory Visit: Payer: Self-pay | Admitting: Adult Health

## 2019-11-13 DIAGNOSIS — N2581 Secondary hyperparathyroidism of renal origin: Secondary | ICD-10-CM | POA: Diagnosis not present

## 2019-11-13 DIAGNOSIS — D509 Iron deficiency anemia, unspecified: Secondary | ICD-10-CM | POA: Diagnosis not present

## 2019-11-13 DIAGNOSIS — Z992 Dependence on renal dialysis: Secondary | ICD-10-CM | POA: Diagnosis not present

## 2019-11-13 DIAGNOSIS — N186 End stage renal disease: Secondary | ICD-10-CM | POA: Diagnosis not present

## 2019-11-13 DIAGNOSIS — D631 Anemia in chronic kidney disease: Secondary | ICD-10-CM | POA: Diagnosis not present

## 2019-11-13 MED ORDER — CLONIDINE HCL 0.1 MG PO TABS: 0.1000 mg | ORAL_TABLET | Freq: Every day | ORAL | 0 refills | Status: DC

## 2019-11-14 ENCOUNTER — Other Ambulatory Visit: Payer: Self-pay | Admitting: Adult Health

## 2019-11-16 DIAGNOSIS — D509 Iron deficiency anemia, unspecified: Secondary | ICD-10-CM | POA: Diagnosis not present

## 2019-11-16 DIAGNOSIS — Z992 Dependence on renal dialysis: Secondary | ICD-10-CM | POA: Diagnosis not present

## 2019-11-16 DIAGNOSIS — D631 Anemia in chronic kidney disease: Secondary | ICD-10-CM | POA: Diagnosis not present

## 2019-11-16 DIAGNOSIS — N2581 Secondary hyperparathyroidism of renal origin: Secondary | ICD-10-CM | POA: Diagnosis not present

## 2019-11-16 DIAGNOSIS — N186 End stage renal disease: Secondary | ICD-10-CM | POA: Diagnosis not present

## 2019-11-17 ENCOUNTER — Ambulatory Visit (INDEPENDENT_AMBULATORY_CARE_PROVIDER_SITE_OTHER): Payer: Medicare Other | Admitting: Adult Health

## 2019-11-17 ENCOUNTER — Other Ambulatory Visit: Payer: Self-pay

## 2019-11-17 ENCOUNTER — Encounter: Payer: Self-pay | Admitting: Adult Health

## 2019-11-17 VITALS — BP 160/60 | Temp 97.6°F | Wt 116.0 lb

## 2019-11-17 DIAGNOSIS — G479 Sleep disorder, unspecified: Secondary | ICD-10-CM

## 2019-11-17 DIAGNOSIS — R630 Anorexia: Secondary | ICD-10-CM

## 2019-11-17 MED ORDER — MIRTAZAPINE 15 MG PO TABS: 7.5000 mg | ORAL_TABLET | Freq: Every day | ORAL | 1 refills | Status: DC
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
## 2019-11-18 DIAGNOSIS — Z992 Dependence on renal dialysis: Secondary | ICD-10-CM | POA: Diagnosis not present

## 2019-11-18 DIAGNOSIS — N186 End stage renal disease: Secondary | ICD-10-CM | POA: Diagnosis not present

## 2019-11-18 DIAGNOSIS — D509 Iron deficiency anemia, unspecified: Secondary | ICD-10-CM | POA: Diagnosis not present

## 2019-11-18 DIAGNOSIS — N2581 Secondary hyperparathyroidism of renal origin: Secondary | ICD-10-CM | POA: Diagnosis not present

## 2019-11-18 DIAGNOSIS — D631 Anemia in chronic kidney disease: Secondary | ICD-10-CM | POA: Diagnosis not present

## 2019-11-20 ENCOUNTER — Other Ambulatory Visit: Payer: Self-pay | Admitting: Adult Health

## 2019-11-20 DIAGNOSIS — D631 Anemia in chronic kidney disease: Secondary | ICD-10-CM | POA: Diagnosis not present

## 2019-11-20 DIAGNOSIS — Z992 Dependence on renal dialysis: Secondary | ICD-10-CM | POA: Diagnosis not present

## 2019-11-20 DIAGNOSIS — N2581 Secondary hyperparathyroidism of renal origin: Secondary | ICD-10-CM | POA: Diagnosis not present

## 2019-11-20 DIAGNOSIS — D509 Iron deficiency anemia, unspecified: Secondary | ICD-10-CM | POA: Diagnosis not present

## 2019-11-20 DIAGNOSIS — N186 End stage renal disease: Secondary | ICD-10-CM | POA: Diagnosis not present

## 2019-11-24 DIAGNOSIS — D509 Iron deficiency anemia, unspecified: Secondary | ICD-10-CM | POA: Diagnosis not present

## 2019-11-24 DIAGNOSIS — Z992 Dependence on renal dialysis: Secondary | ICD-10-CM | POA: Diagnosis not present

## 2019-11-24 DIAGNOSIS — N186 End stage renal disease: Secondary | ICD-10-CM | POA: Diagnosis not present

## 2019-11-24 DIAGNOSIS — D631 Anemia in chronic kidney disease: Secondary | ICD-10-CM | POA: Diagnosis not present

## 2019-11-24 DIAGNOSIS — N2581 Secondary hyperparathyroidism of renal origin: Secondary | ICD-10-CM | POA: Diagnosis not present

## 2019-11-27 DIAGNOSIS — D509 Iron deficiency anemia, unspecified: Secondary | ICD-10-CM | POA: Diagnosis not present

## 2019-11-27 DIAGNOSIS — N2581 Secondary hyperparathyroidism of renal origin: Secondary | ICD-10-CM | POA: Diagnosis not present

## 2019-11-27 DIAGNOSIS — N186 End stage renal disease: Secondary | ICD-10-CM | POA: Diagnosis not present

## 2019-11-27 DIAGNOSIS — Z992 Dependence on renal dialysis: Secondary | ICD-10-CM | POA: Diagnosis not present

## 2019-11-27 DIAGNOSIS — D631 Anemia in chronic kidney disease: Secondary | ICD-10-CM | POA: Diagnosis not present

## 2019-11-30 DIAGNOSIS — D509 Iron deficiency anemia, unspecified: Secondary | ICD-10-CM | POA: Diagnosis not present

## 2019-11-30 DIAGNOSIS — Z992 Dependence on renal dialysis: Secondary | ICD-10-CM | POA: Diagnosis not present

## 2019-11-30 DIAGNOSIS — D631 Anemia in chronic kidney disease: Secondary | ICD-10-CM | POA: Diagnosis not present

## 2019-11-30 DIAGNOSIS — N2581 Secondary hyperparathyroidism of renal origin: Secondary | ICD-10-CM | POA: Diagnosis not present

## 2019-11-30 DIAGNOSIS — N186 End stage renal disease: Secondary | ICD-10-CM | POA: Diagnosis not present

## 2019-12-09 ENCOUNTER — Other Ambulatory Visit: Payer: Self-pay | Admitting: Adult Health

## 2019-12-09 DIAGNOSIS — R630 Anorexia: Secondary | ICD-10-CM

## 2019-12-09 DIAGNOSIS — G479 Sleep disorder, unspecified: Secondary | ICD-10-CM

## 2019-12-10 ENCOUNTER — Emergency Department (HOSPITAL_COMMUNITY): Payer: Medicare Other

## 2019-12-10 ENCOUNTER — Emergency Department (HOSPITAL_COMMUNITY)
Admission: EM | Admit: 2019-12-10 | Discharge: 2019-12-10 | Disposition: A | Discharge status: Home / Self Care | Payer: Medicare Other | Attending: Emergency Medicine | Admitting: Emergency Medicine

## 2019-12-10 ENCOUNTER — Other Ambulatory Visit: Payer: Self-pay

## 2019-12-10 ENCOUNTER — Ambulatory Visit: Payer: Self-pay | Admitting: *Deleted

## 2019-12-10 DIAGNOSIS — Y92019 Unspecified place in single-family (private) house as the place of occurrence of the external cause: Secondary | ICD-10-CM | POA: Insufficient documentation

## 2019-12-10 DIAGNOSIS — Z992 Dependence on renal dialysis: Secondary | ICD-10-CM | POA: Diagnosis not present

## 2019-12-10 DIAGNOSIS — Y999 Unspecified external cause status: Secondary | ICD-10-CM | POA: Diagnosis not present

## 2019-12-10 DIAGNOSIS — Y939 Activity, unspecified: Secondary | ICD-10-CM | POA: Diagnosis not present

## 2019-12-10 DIAGNOSIS — Z79899 Other long term (current) drug therapy: Secondary | ICD-10-CM | POA: Diagnosis not present

## 2019-12-10 DIAGNOSIS — E1122 Type 2 diabetes mellitus with diabetic chronic kidney disease: Secondary | ICD-10-CM | POA: Diagnosis not present

## 2019-12-10 DIAGNOSIS — W1830XA Fall on same level, unspecified, initial encounter: Secondary | ICD-10-CM | POA: Insufficient documentation

## 2019-12-10 DIAGNOSIS — I120 Hypertensive chronic kidney disease with stage 5 chronic kidney disease or end stage renal disease: Secondary | ICD-10-CM | POA: Insufficient documentation

## 2019-12-10 DIAGNOSIS — Y92009 Unspecified place in unspecified non-institutional (private) residence as the place of occurrence of the external cause: Secondary | ICD-10-CM

## 2019-12-10 DIAGNOSIS — N186 End stage renal disease: Secondary | ICD-10-CM | POA: Insufficient documentation

## 2019-12-10 DIAGNOSIS — Z87891 Personal history of nicotine dependence: Secondary | ICD-10-CM | POA: Diagnosis not present

## 2019-12-10 DIAGNOSIS — S7001XA Contusion of right hip, initial encounter: Secondary | ICD-10-CM

## 2019-12-10 DIAGNOSIS — Z7982 Long term (current) use of aspirin: Secondary | ICD-10-CM | POA: Insufficient documentation

## 2019-12-10 DIAGNOSIS — S79911A Unspecified injury of right hip, initial encounter: Secondary | ICD-10-CM | POA: Diagnosis present

## 2019-12-10 DIAGNOSIS — S0003XA Contusion of scalp, initial encounter: Secondary | ICD-10-CM | POA: Insufficient documentation

## 2019-12-10 DIAGNOSIS — W19XXXA Unspecified fall, initial encounter: Secondary | ICD-10-CM

## 2019-12-10 NOTE — ED Notes (Signed)
ED Provider at bedside. 

## 2019-12-10 NOTE — Telephone Encounter (Signed)
Patient advised to go to ER by [facility] per [facility].
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
## 2019-12-10 NOTE — ED Triage Notes (Addendum)
Pt endorses having mechanical fall 1 week ago where she bumped the left side of her head and right hip. Still having right hip pain into the buttocks. VSS. Pt is on dialysis, last treatment yesterday.

## 2019-12-10 NOTE — Discharge Instructions (Signed)
Please read instructions below. Apply ice to your areas of pain for 20 minutes at a time. You can take tylenol every 6 hours as needed for pain. Schedule an appointment with your primary care provider to follow-up on your injury. Return to the ER for severe headache, vision changes, new or concerning symptoms.

## 2019-12-10 NOTE — Telephone Encounter (Signed)
Pt called with complaints of pain in her right side and "rump"; she also says that she fell , and hit her head and right side 2 weeks ago; the pt says her right sided pain started 12/06/2019; the pt says it hurts when she moves a certain way; a heating pad and Tylenol helps; recommendations made per nurse triage protocol; the pt says she can not go to the ED, and she needs approval from her PCP, [name] ([facility]) to get an Xray; explained to pt that she does not need approval to go to ED for treatment; pt transferred to [facility] for final dispositon.   Reason for Disposition . Sounds like a serious injury to the triager  Answer Assessment - Initial Assessment Questions 1. MECHANISM: "How did the injury happen?" (e.g., twisting injury, direct blow)      Pt fell 2 weeks ago 2. ONSET: "When did the injury happen?" (Minutes or hours ago)      Pt fell  3. LOCATION: "Where is the injury located?"      Right "rump" and side 4. APPEARANCE of INJURY: "What does the injury look like?"  (e.g., deformity of leg)     Looks normal 5. SEVERITY: "Can you put weight on that leg?" "Can you walk?"      ye 6. SIZE: For cuts, bruises, or swelling, ask: "How large is it?" (e.g., inches or centimeters;  entire joint)     Pt can not visualize the area 7. PAIN: "Is there pain?" If so, ask: "How bad is the pain?"  (e.g., Scale 1-10; or mild, moderate, severe)     Rated 8-9 out of 10 8. TETANUS: For any breaks in the skin, ask: "When was the last tetanus booster?"    n/a 9. OTHER SYMPTOMS: "Do you have any other symptoms?"      Knot on left side of head; pain from right waist down to hip 10. PREGNANCY: "Is there any chance you are pregnant?" "When was your last menstrual period?" no  Protocols used: HIP INJURY-A-AH

## 2019-12-10 NOTE — ED Notes (Signed)
Patient transported to X-ray 

## 2019-12-17 ENCOUNTER — Telehealth (INDEPENDENT_AMBULATORY_CARE_PROVIDER_SITE_OTHER): Payer: Medicare Other | Admitting: Adult Health

## 2019-12-17 ENCOUNTER — Other Ambulatory Visit: Payer: Self-pay

## 2019-12-17 DIAGNOSIS — G479 Sleep disorder, unspecified: Secondary | ICD-10-CM

## 2019-12-17 DIAGNOSIS — R630 Anorexia: Secondary | ICD-10-CM | POA: Diagnosis not present

## 2019-12-17 NOTE — Progress Notes (Signed)
Virtual Visit via Telephone Note  I connected with [name] on 12/17/19 at 10:30 AM EST by telephone and verified that I am speaking with the correct person using two identifiers.   I discussed the limitations, risks, security and privacy concerns of performing an evaluation and management service by telephone and the availability of in person appointments. I also discussed with the patient that there may be a patient responsible charge related to this service. The patient expressed understanding and agreed to proceed.  Location patient: home Location provider: work or home office Participants present for the call: patient, provider Patient did not have a visit in the prior 7 days to address this/these issue(s).   History of Present Illness: 85 year old female is being evaluated today for 1 month follow-up for loss of appetite and sleep disturbance.  During the last visit she was started on Remeron 7.5 mg.  She reports today that she stopped taking this medication as it caused side effects that she did not enjoy.  Her reported side effects were nosebleeds as well as twitching.  He does feel as though she is eating more and is sleeping better without the medication.  At this time she does not want to try any other medications.     Observations/Objective: Patient sounds cheerful and well on the phone. I do not appreciate any SOB. Speech and thought processing are grossly intact. Patient reported vitals:  Assessment and Plan -Advised her that I did not think the nosebleeds were coming from the medication but it can cause twitching in a small population that takes Remeron.  She was advised to follow-up as needed for this issue  Follow Up Instructions:  I did not refer this patient for an OV in the next 24 hours for this/these issue(s).  I discussed the assessment and treatment plan with the patient. The patient was provided an opportunity to ask questions and all were answered. The  patient agreed with the plan and demonstrated an understanding of the instructions.   The patient was advised to call back or seek an in-person evaluation if the symptoms worsen or if the condition fails to improve as anticipated.  I provided 15 minutes of non-face-to-face time during this encounter.   Dorothyann Peng, NP

## 2019-12-18 ENCOUNTER — Telehealth: Payer: Self-pay | Admitting: Adult Health

## 2019-12-18 NOTE — Telephone Encounter (Signed)
When I spoke to the patient she said that she did not want another medication.   I tried calling [facility] but her phone kept going to a busy signal

## 2019-12-18 NOTE — Telephone Encounter (Signed)
Pt's daughter [name] stated pt was supposed to receive another medication in place of the one causing nosebleeds and seizures. Requesting cb from [facility] regarding this. Please advise.

## 2019-12-20 ENCOUNTER — Inpatient Hospital Stay (HOSPITAL_COMMUNITY)
Admission: EM | Admit: 2019-12-20 | Discharge: 2020-01-01 | DRG: 314 | Disposition: E | Payer: Medicare Other | Attending: Internal Medicine | Admitting: Internal Medicine

## 2019-12-20 ENCOUNTER — Emergency Department (HOSPITAL_COMMUNITY): Payer: Medicare Other

## 2019-12-20 ENCOUNTER — Other Ambulatory Visit: Payer: Self-pay

## 2019-12-20 ENCOUNTER — Encounter (HOSPITAL_COMMUNITY): Payer: Self-pay | Admitting: *Deleted

## 2019-12-20 DIAGNOSIS — E1151 Type 2 diabetes mellitus with diabetic peripheral angiopathy without gangrene: Secondary | ICD-10-CM | POA: Diagnosis present

## 2019-12-20 DIAGNOSIS — E8729 Other acidosis: Secondary | ICD-10-CM

## 2019-12-20 DIAGNOSIS — N2581 Secondary hyperparathyroidism of renal origin: Secondary | ICD-10-CM | POA: Diagnosis present

## 2019-12-20 DIAGNOSIS — S32038D Other fracture of third lumbar vertebra, subsequent encounter for fracture with routine healing: Secondary | ICD-10-CM | POA: Diagnosis not present

## 2019-12-20 DIAGNOSIS — I82291 Chronic embolism and thrombosis of other thoracic veins: Secondary | ICD-10-CM | POA: Diagnosis present

## 2019-12-20 DIAGNOSIS — Z888 Allergy status to other drugs, medicaments and biological substances status: Secondary | ICD-10-CM | POA: Diagnosis not present

## 2019-12-20 DIAGNOSIS — I748 Embolism and thrombosis of other arteries: Secondary | ICD-10-CM | POA: Diagnosis not present

## 2019-12-20 DIAGNOSIS — K219 Gastro-esophageal reflux disease without esophagitis: Secondary | ICD-10-CM | POA: Diagnosis present

## 2019-12-20 DIAGNOSIS — R531 Weakness: Secondary | ICD-10-CM | POA: Diagnosis present

## 2019-12-20 DIAGNOSIS — T827XXA Infection and inflammatory reaction due to other cardiac and vascular devices, implants and grafts, initial encounter: Secondary | ICD-10-CM | POA: Diagnosis present

## 2019-12-20 DIAGNOSIS — Z515 Encounter for palliative care: Secondary | ICD-10-CM | POA: Diagnosis present

## 2019-12-20 DIAGNOSIS — J449 Chronic obstructive pulmonary disease, unspecified: Secondary | ICD-10-CM | POA: Diagnosis present

## 2019-12-20 DIAGNOSIS — I272 Pulmonary hypertension, unspecified: Secondary | ICD-10-CM | POA: Diagnosis present

## 2019-12-20 DIAGNOSIS — G9341 Metabolic encephalopathy: Secondary | ICD-10-CM | POA: Diagnosis present

## 2019-12-20 DIAGNOSIS — N179 Acute kidney failure, unspecified: Secondary | ICD-10-CM

## 2019-12-20 DIAGNOSIS — E872 Acidosis: Secondary | ICD-10-CM | POA: Diagnosis present

## 2019-12-20 DIAGNOSIS — Z825 Family history of asthma and other chronic lower respiratory diseases: Secondary | ICD-10-CM

## 2019-12-20 DIAGNOSIS — R6521 Severe sepsis with septic shock: Secondary | ICD-10-CM | POA: Diagnosis not present

## 2019-12-20 DIAGNOSIS — I959 Hypotension, unspecified: Secondary | ICD-10-CM

## 2019-12-20 DIAGNOSIS — J9601 Acute respiratory failure with hypoxia: Secondary | ICD-10-CM | POA: Diagnosis not present

## 2019-12-20 DIAGNOSIS — Z66 Do not resuscitate: Secondary | ICD-10-CM | POA: Diagnosis present

## 2019-12-20 DIAGNOSIS — A419 Sepsis, unspecified organism: Secondary | ICD-10-CM | POA: Diagnosis not present

## 2019-12-20 DIAGNOSIS — I2510 Atherosclerotic heart disease of native coronary artery without angina pectoris: Secondary | ICD-10-CM | POA: Diagnosis present

## 2019-12-20 DIAGNOSIS — R251 Tremor, unspecified: Secondary | ICD-10-CM | POA: Diagnosis not present

## 2019-12-20 DIAGNOSIS — D509 Iron deficiency anemia, unspecified: Secondary | ICD-10-CM | POA: Diagnosis present

## 2019-12-20 DIAGNOSIS — I9589 Other hypotension: Secondary | ICD-10-CM

## 2019-12-20 DIAGNOSIS — I058 Other rheumatic mitral valve diseases: Secondary | ICD-10-CM

## 2019-12-20 DIAGNOSIS — D631 Anemia in chronic kidney disease: Secondary | ICD-10-CM | POA: Diagnosis present

## 2019-12-20 DIAGNOSIS — W19XXXA Unspecified fall, initial encounter: Secondary | ICD-10-CM | POA: Diagnosis present

## 2019-12-20 DIAGNOSIS — Z89432 Acquired absence of left foot: Secondary | ICD-10-CM

## 2019-12-20 DIAGNOSIS — Y838 Other surgical procedures as the cause of abnormal reaction of the patient, or of later complication, without mention of misadventure at the time of the procedure: Secondary | ICD-10-CM | POA: Diagnosis present

## 2019-12-20 DIAGNOSIS — I120 Hypertensive chronic kidney disease with stage 5 chronic kidney disease or end stage renal disease: Secondary | ICD-10-CM | POA: Diagnosis present

## 2019-12-20 DIAGNOSIS — Z8261 Family history of arthritis: Secondary | ICD-10-CM

## 2019-12-20 DIAGNOSIS — E1122 Type 2 diabetes mellitus with diabetic chronic kidney disease: Secondary | ICD-10-CM | POA: Diagnosis present

## 2019-12-20 DIAGNOSIS — E785 Hyperlipidemia, unspecified: Secondary | ICD-10-CM | POA: Diagnosis not present

## 2019-12-20 DIAGNOSIS — E11649 Type 2 diabetes mellitus with hypoglycemia without coma: Secondary | ICD-10-CM | POA: Diagnosis present

## 2019-12-20 DIAGNOSIS — I059 Rheumatic mitral valve disease, unspecified: Secondary | ICD-10-CM

## 2019-12-20 DIAGNOSIS — E861 Hypovolemia: Secondary | ICD-10-CM

## 2019-12-20 DIAGNOSIS — N186 End stage renal disease: Secondary | ICD-10-CM | POA: Diagnosis present

## 2019-12-20 DIAGNOSIS — Z992 Dependence on renal dialysis: Secondary | ICD-10-CM | POA: Diagnosis not present

## 2019-12-20 DIAGNOSIS — S32039A Unspecified fracture of third lumbar vertebra, initial encounter for closed fracture: Secondary | ICD-10-CM

## 2019-12-20 DIAGNOSIS — N189 Chronic kidney disease, unspecified: Secondary | ICD-10-CM | POA: Diagnosis not present

## 2019-12-20 DIAGNOSIS — R7881 Bacteremia: Secondary | ICD-10-CM

## 2019-12-20 DIAGNOSIS — E039 Hypothyroidism, unspecified: Secondary | ICD-10-CM | POA: Diagnosis present

## 2019-12-20 DIAGNOSIS — Z79890 Hormone replacement therapy (postmenopausal): Secondary | ICD-10-CM

## 2019-12-20 DIAGNOSIS — A411 Sepsis due to other specified staphylococcus: Secondary | ICD-10-CM | POA: Diagnosis present

## 2019-12-20 DIAGNOSIS — B957 Other staphylococcus as the cause of diseases classified elsewhere: Secondary | ICD-10-CM | POA: Diagnosis not present

## 2019-12-20 DIAGNOSIS — E871 Hypo-osmolality and hyponatremia: Secondary | ICD-10-CM | POA: Diagnosis present

## 2019-12-20 DIAGNOSIS — Z8249 Family history of ischemic heart disease and other diseases of the circulatory system: Secondary | ICD-10-CM

## 2019-12-20 DIAGNOSIS — R34 Anuria and oliguria: Secondary | ICD-10-CM | POA: Diagnosis present

## 2019-12-20 DIAGNOSIS — I330 Acute and subacute infective endocarditis: Secondary | ICD-10-CM | POA: Diagnosis present

## 2019-12-20 DIAGNOSIS — S32038A Other fracture of third lumbar vertebra, initial encounter for closed fracture: Secondary | ICD-10-CM | POA: Diagnosis present

## 2019-12-20 DIAGNOSIS — Z20828 Contact with and (suspected) exposure to other viral communicable diseases: Secondary | ICD-10-CM | POA: Diagnosis present

## 2019-12-20 DIAGNOSIS — T829XXA Unspecified complication of cardiac and vascular prosthetic device, implant and graft, initial encounter: Secondary | ICD-10-CM

## 2019-12-20 DIAGNOSIS — E875 Hyperkalemia: Secondary | ICD-10-CM | POA: Diagnosis present

## 2019-12-20 DIAGNOSIS — D649 Anemia, unspecified: Secondary | ICD-10-CM | POA: Diagnosis not present

## 2019-12-20 DIAGNOSIS — Z7982 Long term (current) use of aspirin: Secondary | ICD-10-CM

## 2019-12-20 DIAGNOSIS — M479 Spondylosis, unspecified: Secondary | ICD-10-CM | POA: Diagnosis present

## 2019-12-20 DIAGNOSIS — I361 Nonrheumatic tricuspid (valve) insufficiency: Secondary | ICD-10-CM | POA: Diagnosis not present

## 2019-12-20 DIAGNOSIS — Z87891 Personal history of nicotine dependence: Secondary | ICD-10-CM

## 2019-12-20 DIAGNOSIS — R6520 Severe sepsis without septic shock: Secondary | ICD-10-CM | POA: Diagnosis present

## 2019-12-20 DIAGNOSIS — I76 Septic arterial embolism: Secondary | ICD-10-CM | POA: Diagnosis present

## 2019-12-20 DIAGNOSIS — A409 Streptococcal sepsis, unspecified: Secondary | ICD-10-CM | POA: Diagnosis not present

## 2019-12-20 DIAGNOSIS — G9340 Encephalopathy, unspecified: Secondary | ICD-10-CM

## 2019-12-20 DIAGNOSIS — I340 Nonrheumatic mitral (valve) insufficiency: Secondary | ICD-10-CM | POA: Diagnosis not present

## 2019-12-20 DIAGNOSIS — J45909 Unspecified asthma, uncomplicated: Secondary | ICD-10-CM | POA: Diagnosis not present

## 2019-12-20 LAB — COMPREHENSIVE METABOLIC PANEL
ALT: 13 U/L (ref 0–44)
AST: 28 U/L (ref 15–41)
Albumin: 2.7 g/dL — ABNORMAL LOW (ref 3.5–5.0)
Alkaline Phosphatase: 90 U/L (ref 38–126)
Anion gap: 19 — ABNORMAL HIGH (ref 5–15)
BUN: 57 mg/dL — ABNORMAL HIGH (ref 8–23)
CO2: 21 mmol/L — ABNORMAL LOW (ref 22–32)
Calcium: 8 mg/dL — ABNORMAL LOW (ref 8.9–10.3)
Chloride: 88 mmol/L — ABNORMAL LOW (ref 98–111)
Creatinine, Ser: 8.43 mg/dL — ABNORMAL HIGH (ref 0.44–1.00)
GFR calc Af Amer: 5 mL/min — ABNORMAL LOW (ref >60)
GFR calc non Af Amer: 4 mL/min — ABNORMAL LOW (ref >60)
Glucose, Bld: 158 mg/dL — ABNORMAL HIGH (ref 70–99)
Potassium: 4 mmol/L (ref 3.5–5.1)
Sodium: 128 mmol/L — ABNORMAL LOW (ref 135–145)
Total Bilirubin: 0.4 mg/dL (ref 0.3–1.2)
Total Protein: 7.4 g/dL (ref 6.5–8.1)

## 2019-12-20 LAB — CBC WITH DIFFERENTIAL/PLATELET
Abs Immature Granulocytes: 0.19 10*3/uL — ABNORMAL HIGH (ref 0.00–0.07)
Basophils Absolute: 0 10*3/uL (ref 0.0–0.1)
Basophils Relative: 0 %
Eosinophils Absolute: 0 10*3/uL (ref 0.0–0.5)
Eosinophils Relative: 0 %
HCT: 32.7 % — ABNORMAL LOW (ref 36.0–46.0)
Hemoglobin: 10.3 g/dL — ABNORMAL LOW (ref 12.0–15.0)
Immature Granulocytes: 1 %
Lymphocytes Relative: 5 %
Lymphs Abs: 1.1 10*3/uL (ref 0.7–4.0)
MCH: 28.2 pg (ref 26.0–34.0)
MCHC: 31.5 g/dL (ref 30.0–36.0)
MCV: 89.6 fL (ref 80.0–100.0)
Monocytes Absolute: 1.7 10*3/uL — ABNORMAL HIGH (ref 0.1–1.0)
Monocytes Relative: 8 %
Neutro Abs: 17.6 10*3/uL — ABNORMAL HIGH (ref 1.7–7.7)
Neutrophils Relative %: 86 %
Platelets: 191 10*3/uL (ref 150–400)
RBC: 3.65 MIL/uL — ABNORMAL LOW (ref 3.87–5.11)
RDW: 18 % — ABNORMAL HIGH (ref 11.5–15.5)
WBC: 20.6 10*3/uL — ABNORMAL HIGH (ref 4.0–10.5)
nRBC: 0 % (ref 0.0–0.2)

## 2019-12-20 LAB — SARS CORONAVIRUS 2 (TAT 6-24 HRS): SARS Coronavirus 2: NEGATIVE

## 2019-12-20 MED ORDER — LACTATED RINGERS IV BOLUS
1000.0000 mL | Freq: Once | INTRAVENOUS | Status: AC
  Administered 2019-12-20: 1000 mL via INTRAVENOUS

## 2019-12-20 MED ORDER — ACETAMINOPHEN 325 MG PO TABS
325.0000 mg | ORAL_TABLET | Freq: Once | ORAL | Status: AC
  Administered 2019-12-20: 325 mg via ORAL
  Filled 2019-12-20: qty 1

## 2019-12-20 MED ORDER — VANCOMYCIN VARIABLE DOSE PER UNSTABLE RENAL FUNCTION (PHARMACIST DOSING): Status: DC

## 2019-12-20 MED ORDER — MIDODRINE HCL 2.5 MG PO TABS
2.5000 mg | ORAL_TABLET | Freq: Two times a day (BID) | ORAL | Status: DC
  Filled 2019-12-20: qty 1

## 2019-12-20 MED ORDER — SODIUM CHLORIDE 0.9 % IV SOLN
2.0000 g | Freq: Once | INTRAVENOUS | Status: AC
  Administered 2019-12-20: 2 g via INTRAVENOUS
  Filled 2019-12-20: qty 2

## 2019-12-20 MED ORDER — SODIUM CHLORIDE 0.9 % IV SOLN
1.0000 g | INTRAVENOUS | Status: DC
  Administered 2019-12-21 – 2019-12-22 (×2): 1 g via INTRAVENOUS
  Filled 2019-12-20 (×5): qty 1

## 2019-12-20 MED ORDER — VANCOMYCIN HCL 1250 MG/250ML IV SOLN
1250.0000 mg | Freq: Once | INTRAVENOUS | Status: AC
  Administered 2019-12-20: 1250 mg via INTRAVENOUS
  Filled 2019-12-20: qty 250

## 2019-12-20 MED ORDER — HEPARIN SODIUM (PORCINE) 5000 UNIT/ML IJ SOLN
5000.0000 [IU] | Freq: Three times a day (TID) | INTRAMUSCULAR | Status: DC
  Administered 2019-12-21 – 2019-12-24 (×10): 5000 [IU] via SUBCUTANEOUS
  Filled 2019-12-20 (×10): qty 1

## 2019-12-20 NOTE — ED Triage Notes (Signed)
DR Ralene Bathe to start a [place] IV

## 2019-12-20 NOTE — ED Triage Notes (Signed)
EDP Ralene Bathe attempted an [place] IV unable to obtain

## 2019-12-20 NOTE — ED Triage Notes (Signed)
EDP Kohut at bedside and collected Blood cultures  With [place] . Only able to obtain 1 set of blood cultures , EDP  Understands only one set drawn.

## 2019-12-20 NOTE — ED Triage Notes (Signed)
This writer attempted to access IV site x2 unable to obtain IV access. Pt is a Dialysis PT and a difficult stick. Will ask EDP  To try [place] IV placement.

## 2019-12-20 NOTE — ED Provider Notes (Signed)
[facility] EMERGENCY DEPARTMENT Provider Note   CSN: [id number] Arrival date & time: 12/07/2019  1154     History Chief Complaint  Patient presents with  . Weakness    [name] is a 85 y.o. female.  The history is provided by the patient. No language interpreter was used.  Weakness Severity:  Moderate Onset quality:  Gradual Timing:  Constant Progression:  Unchanged Chronicity:  New Relieved by:  Nothing Worsened by:  Nothing Ineffective treatments:  None tried Associated symptoms: no abdominal pain    Pt tells me her daughter had her come in because she thinks she is dehydrated.  Pt reports she has a cough but she states she has not been exposed to covid. Pt has dialysis on MW and Friday.  Pt missed dialysis on Monday and Wednesday.   I spoke to pt's daughter.  She states pt lives with her niece. Pt missed dialysis on Monday and Wednesday.  Pt did go on Friday.  Pt is suppose to have a port-a-cath on Tuesday.  She reports pt fell 2 weeks ago and has had groin pain since.  Pt having difficulty walking.  Pt can stand and walk short distances with her walker, but she struggled on Friday.   Past Medical History:  Diagnosis Date  . Arthritis    "all over" (03/09/2014)  . Asthma    "used to; not anymore" (03/09/2014)  . Blood transfusion 1960's  . Chronic anemia   . Chronic back pain   . ESRD (end stage renal disease) on dialysis ([place])    "[address]  (04/10/2017)  . Gangrene ([place])    left fifth toe  . GERD (gastroesophageal reflux disease)    hx (03/09/2014)  . Gout, unspecified 1980's   "not anymore" (03/09/2014)  . Hyperlipidemia   . Hypertension   . Iliopsoas muscle hematoma 04/10/2017  . Mild pulmonary hypertension ([place])   . PAD (peripheral artery disease) ([place])    a. amputation of L toe 2013, with left external iliac artery to tibioperoneal trunk bypass graft with endarterectomy if the tibioperoneal trunk and anterior tibial  artery origin in October 2013.  Marland Kitchen Pneumonia    "once; years ago" (03/09/2014)  . Shoulder fracture, right 2012  . Type II diabetes mellitus ([place])    "not on any medication at this time" (03/09/2014)    Patient Active Problem List   Diagnosis Date Noted  . Iliopsoas muscle hematoma 04/10/2017  . Peripheral arterial disease ([place]) 02/14/2016  . DOE (dyspnea on exertion) 11/10/2015  . Unresponsive episode 06/10/2013  . ESRD (end stage renal disease) ([place]) 06/09/2013  . Arteriosclerosis, mesenteric artery ([place]) 03/08/2013  . Nonocclusive mesenteric ischemia ([place]) 03/07/2013  . S/P lumbar spine operation 11/25/2012  . Chest pain 04/24/2012  . Tachycardia 04/16/2012  . Atherosclerosis of native arteries of the extremities with gangrene ([place]) 04/10/2012  . Mitral valve disorders(424.0) 03/28/2010  . PALPITATIONS 02/14/2010  . Type 2 diabetes mellitus with diabetic chronic kidney disease ([place]) 02/13/2010  . GOUT 02/13/2010  . HYPERKALEMIA 02/13/2010  . HTN (hypertension) 02/13/2010  . COPD 02/13/2010  . RENAL FAILURE, END STAGE 02/13/2010  . WALKING DIFFICULTY 02/13/2010    Past Surgical History:  Procedure Laterality Date  . APPENDECTOMY  1960's  . ARTERIOVENOUS GRAFT PLACEMENT Left    femoral loop arteriovenous Gore-Tex graft.  . AV FISTULA PLACEMENT  2008- 2013   "left upper arm; twice in my neck; left leg; removed from left leg;  right upper arm" (11/25/2012)  . AV FISTULA PLACEMENT Right 03/09/2014   Procedure: RIGHT AXILLARY EXPLORATION; PARTIAL REMOVOAL OF OLD ARTERIOVENOUS (AV) GORE-TEX GRAFT; LIGATION OF RIGHT AXILLARY VEIN; ULTRASOUND GUIDED;  Surgeon: Conrad [place], MD;  Location: [facility];  Service: Vascular;  Laterality: Right;  . AV FISTULA REPAIR Bilateral    "right/left arm fistula failed; removed left thigh d/t poor circulation" (11/25/2012)  . CATARACT EXTRACTION, BILATERAL Bilateral   . COLONOSCOPY  2014?  . ESOPHAGOGASTRODUODENOSCOPY N/A 09/10/2013   Procedure:  ESOPHAGOGASTRODUODENOSCOPY (EGD);  Surgeon: [name], MD;  Location: [name];  Service: Endoscopy;  Laterality: N/A;  . EXCHANGE OF A DIALYSIS CATHETER Right 06/11/2013   Procedure: EXCHANGE OF A DIALYSIS CATHETER;  Surgeon: Conrad [place], MD;  Location: [facility];  Service: Vascular;  Laterality: Right;  . EYE SURGERY    . FEMORAL-POPLITEAL BYPASS GRAFT  04/11/2012   Procedure: BYPASS GRAFT FEMORAL-POPLITEAL ARTERY;  Surgeon: [name], MD;  Location: [facility];  Service: Vascular;  Laterality: Left;  Thrombectomy/Left femoral-popliteal bypass with revision of proximal end and shortening of graft; intraoperative arteriogram; endarterectomy and patch angioplasty with distal anastomosis  . FEMORAL-POPLITEAL BYPASS GRAFT  10/09/2012   Procedure: BYPASS GRAFT FEMORAL-POPLITEAL ARTERY;  Surgeon: [name], MD;  Location: [facility];  Service: Vascular;  Laterality: Left;  Redo left tibioperoneal trunk bypass with Gortex Graft 59mmx80cm.  Marland Kitchen FISTULOGRAM Right 06/11/2013   Procedure: Venogram with angioplasty;  Surgeon: Conrad Richland, MD;  Location: [facility];  Service: Vascular;  Laterality: Right;  RIGHT CENTRAL VENOGRAM WITH ANGIOPLASTY  . FOOT AMPUTATION THROUGH METATARSAL Left 06/22/11   "whole 5th toe" (11/25/2012)  . INSERTION OF DIALYSIS CATHETER Right 03/10/2014   Procedure: INSERTION OF TUNNELED  DIALYSIS CATHETER -attempted  RIGHT SUBCLAVIAN, RIGHT FEMORAL TUNNELED DIALYSIS CATHETER EXCHANGE with Inferior Vena Cava gram and Fibrin Sheath Angioplasty;  Surgeon: Conrad , MD;  Location: [facility];  Service: Vascular;  Laterality: Right;  . KYPHOPLASTY  11/25/2012   Procedure: KYPHOPLASTY;  Surgeon: Kristeen Miss, MD;  Location: [facility];  Service: Neurosurgery;  Laterality: N/A;  Lumbar two lumbar five Kyphoplasty  . PR VEIN BYPASS GRAFT,AORTO-FEM-POP  06/14/2011  . TOTAL ABDOMINAL HYSTERECTOMY  1960's   one ovary removed     OB History   No obstetric history on file.     Family  History  Problem Relation Age of Onset  . Peripheral vascular disease Mother        amputation  . Hypertension Mother   . Alcohol abuse Mother   . Arthritis Mother   . COPD Sister   . Heart attack Sister     Social History   Tobacco Use  . Smoking status: Former Smoker    Packs/day: 0.12    Years: 15.00    Pack years: 1.80    Types: Cigarettes  . Smokeless tobacco: Never Used  . Tobacco comment: 03/09/2014 "quit smoking cigarettes in the 1990's"  Substance Use Topics  . Alcohol use: No    Comment: hx of abuse stopped 1990's  . Drug use: No    Types: Marijuana    Comment: 03/09/2014 "tried marijuana in the 1990's; couldn't handle it"    Home Medications Prior to Admission medications   Medication Sig Start Date End Date Taking? Authorizing Provider  acetaminophen (TYLENOL) 500 MG tablet Take 500 mg by mouth every 6 (six) hours as needed for moderate pain.    [provider]  amLODipine (NORVASC) 2.5  MG tablet TAKE 1 TABLET (2.5 MG TOTAL) BY MOUTH DAILY. 04/02/17   Vadie Spark, MD  aspirin 81 MG chewable tablet Chew 81 mg by mouth daily.    [provider]  cloNIDine (CATAPRES) 0.1 MG tablet Take 1 tablet (0.1 mg total) by mouth daily. 11/24/19 2019-12-29  Nafziger, Tommi Rumps, NP  latanoprost (XALATAN) 0.005 % ophthalmic solution 1 drop at bedtime.    [provider]  levothyroxine (SYNTHROID) 25 MCG tablet TAKE 1 TABLET BY MOUTH DAILY BEFORE BREAKFAST. 11/18/19   Nafziger, Tommi Rumps, NP  pantoprazole (PROTONIX) 40 MG tablet Take 40 mg by mouth at bedtime as needed (occasional).     [provider]  sevelamer carbonate (RENVELA) 800 MG tablet Take 800 mg by mouth as directed.    [provider]  traMADol (ULTRAM) 50 MG tablet Take 1 tablet (50 mg total) by mouth every 6 (six) hours as needed for moderate pain. 09/29/19   Newt Minion, MD    Allergies    Ace inhibitors  Review of Systems   Review of Systems  Gastrointestinal: Negative  for abdominal pain.  Neurological: Positive for weakness.  All other systems reviewed and are negative.   Physical Exam Updated Vital Signs BP (!) 127/57 (BP Location: Right Arm)   Pulse (!) 111   Temp 99.4 F (37.4 C) (Oral)   Resp 20   Ht 4\' 10"  (1.473 m)   Wt 52.6 kg   SpO2 98%   BMI 24.24 kg/m   Physical Exam Vitals and nursing note reviewed.  Constitutional:      Appearance: She is well-developed.  HENT:     Head: Normocephalic.  Cardiovascular:     Rate and Rhythm: Normal rate.  Pulmonary:     Effort: Pulmonary effort is normal.  Abdominal:     General: There is no distension.  Musculoskeletal:        General: Normal range of motion.     Cervical back: Normal range of motion and neck supple.  Neurological:     General: No focal deficit present.     Mental Status: She is alert and oriented to person, place, and time.  Psychiatric:        Mood and Affect: Mood normal.     ED Results / Procedures / Treatments   Labs (all labs ordered are listed, but only abnormal results are displayed) Labs Reviewed  SARS CORONAVIRUS 2 (TAT 6-24 HRS)  CBC WITH DIFFERENTIAL/PLATELET  COMPREHENSIVE METABOLIC PANEL    EKG EKG Interpretation  Date/Time:  Sunday December 20 2019 11:55:01 EST Ventricular Rate:  112 PR Interval:    QRS Duration: 99 QT Interval:  363 QTC Calculation: 496 R Axis:   -64 Text Interpretation: Sinus tachycardia Atrial premature complex Left anterior fascicular block Anterior infarct, old Confirmed by Quintella Reichert [phone]) on 12/17/2019 12:20:06 PM   Radiology DG Chest Port 1 View  Result Date: 12/14/2019 CLINICAL DATA:  Cough. EXAM: PORTABLE CHEST 1 VIEW COMPARISON:  May 26, 2014 FINDINGS: A new right central line terminates near the caval atrial junction. No pneumothorax. The lungs are clear. The cardiomediastinal silhouette is stable. No overt edema, nodule, mass, or infiltrate. IMPRESSION: 1. The new right central line terminates near the  caval atrial junction. No pneumothorax. 2. No other acute abnormalities. Electronically Signed   By: Dorise Bullion III M.D   On: 12/25/2019 13:27    Procedures Procedures (including critical care time)  Medications Ordered in ED Medications - No data to  display  ED Course  I have reviewed the triage vital signs and the nursing notes.  Pertinent labs & imaging results that were available during my care of the patient were reviewed by me and considered in my medical decision making (see chart for details).    MDM Rules/Calculators/A&P                      MDM:  Pt's care turned over to Dr. Wilson Singer  Ct and cbc pending.  Final Clinical Impression(s) / ED Diagnoses Final diagnoses:  Weakness  Fall, initial encounter    Rx / DC Orders ED Discharge Orders    None       Sidney Ace 12/06/2019 1528    Quintella Reichert, MD 12/23/19 1001

## 2019-12-20 NOTE — ED Triage Notes (Signed)
PT from home with reported weakness for 2 weeks and cough unsure how long

## 2019-12-20 NOTE — Progress Notes (Signed)
Pharmacy Antibiotic Note  [name] is a 85 y.o. female admitted on 12/21/2019 with sepsis.  Pharmacy has been consulted for vancomycin/cefepime dosing. ESRD on HD MWF - reports she missed 2 sessions this week on Mon/Wed but did go Friday.  Plan: Cefepime 2g IV x 1; then 1g IV q24h Vancomycin 1250mg  IV x1; then 500mg  IV qHD Monitor clinical progress, c/s, abx plan/LOT Pre-HD vancomycin level as indicated F/u HD schedule/tolerance inpatient to enter antibiotic maintenance doses    Height: 4\' 10"  (147.3 cm) Weight: 116 lb (52.6 kg) IBW/kg (Calculated) : 40.9  Temp (24hrs), Avg:100.1 F (37.8 C), Min:99.4 F (37.4 C), Max:100.7 F (38.2 C)  Recent Labs  Lab 12/12/2019 1340 12/18/2019 1423  WBC  --  20.6*  CREATININE 8.43*  --     Estimated Creatinine Clearance: 3.6 mL/min (A) (by C-G formula based on SCr of 8.43 mg/dL (H)).    Allergies  Allergen Reactions  . Ace Inhibitors Other (See Comments)    Reaction unknown    Antimicrobials this admission: 12/20 vancomycin >>  12/20 cefepime >>   Dose adjustments this admission:   Microbiology results:   Elicia Lamp, PharmD, BCPS Clinical Pharmacist 12/19/2019 6:06 PM

## 2019-12-20 NOTE — ED Triage Notes (Signed)
Secure chat sent to EDP Kohut to report unable to obtain Blood cultures. EDp reported he could [place] e [place] to get Blood cultures

## 2019-12-20 NOTE — H&P (Signed)
History and Physical    [name] [id number] DOB: 1934-10-20 DOA: 12/03/2019  PCP: Dorothyann Peng, NP Patient coming from: Home, lives with granddaughter  I have personally briefly reviewed patient's old medical records in [place]  Chief Complaint: "dehydrated"  HPI: [name] is a 85 y.o. female with medical history significant of  ESRD on HD MWF, PAD s/p bypass of left LE, hx of mesenteric ischemia, hypertension, COPD, Asthma, Type 2 diabetes who presents with concerns of dehydration.  Patient has had decrease appetite for the past 2-3 days and daughter thought she was dehydrated and brought her to ED. She reportedly missed dialysis on Monday and Wednesday but had it on Friday. She reports clotting issues with her port last week but that has resolved. Denies any drainage or pain at port site. Denies any fevers. No nausea, vomiting, diarrhea. Last bowel movement was yesterday. She has chronic cough but denies any sore throat, runny nose or shortness or breath. Only symptom has been decrease appetite.  She reportedly also had a fall a few weeks ago due to generalized weakness.   ED Course: She was febrile with temperature up to 100.7 and hypotensive down to 66/26 with improved to 155/85 with 1L of LR. HR up to 114. 94% O2 on ambient air. CBC showed leukocytosis of 20.6K, anemia of 10.3. Sodium of 128, Potassium of 4.0, glucose of 158, creatinine of 8.43 from a baseline of 4-5. Anion gap of 19. COVID test negative.   CT abdomen showed acute fracture involving the right lateral aspect of the L3 vertebral body, superior endplate and osteophyte complex. No acute intra-abdominal or pelvic findings.  CXR showed no acute abnormalities.  Negative pelvic and right hip X-ray.  Review of Systems:  Constitutional:  No Fever ENT/Mouth: No sore throat, No Rhinorrhea Eyes: No Vision Changes Cardiovascular: No Chest Pain, no SOB Respiratory: + Cough, No Sputum, no Dyspnea    Gastrointestinal: No Nausea, No Vomiting, No Diarrhea, No Constipation, No Pain Genitourinary: no Urinary Incontinence Musculoskeletal: No Arthralgias, No Myalgias Skin: No Skin Lesions, No Pruritus, Neuro: no Weakness, No Numbness Psych: + decrease appetite Heme/Lymph: No Bruising, No Bleeding  Past Medical History:  Diagnosis Date  . Arthritis    "all over" (03/09/2014)  . Asthma    "used to; not anymore" (03/09/2014)  . Blood transfusion 1960's  . Chronic anemia   . Chronic back pain   . ESRD (end stage renal disease) on dialysis ([place])    "M, [address]  (04/10/2017)  . Gangrene ([place])    left fifth toe  . GERD (gastroesophageal reflux disease)    hx (03/09/2014)  . Gout, unspecified 1980's   "not anymore" (03/09/2014)  . Hyperlipidemia   . Hypertension   . Iliopsoas muscle hematoma 04/10/2017  . Mild pulmonary hypertension ([place])   . PAD (peripheral artery disease) ([place])    a. amputation of L toe 2013, with left external iliac artery to tibioperoneal trunk bypass graft with endarterectomy if the tibioperoneal trunk and anterior tibial artery origin in October 2013.  Marland Kitchen Pneumonia    "once; years ago" (03/09/2014)  . Shoulder fracture, right 2012  . Type II diabetes mellitus ([place])    "not on any medication at this time" (03/09/2014)    Past Surgical History:  Procedure Laterality Date  . APPENDECTOMY  1960's  . ARTERIOVENOUS GRAFT PLACEMENT Left    femoral loop arteriovenous Gore-Tex graft.  . AV FISTULA PLACEMENT  2008- 2013   "  left upper arm; twice in my neck; left leg; removed from left leg; right upper arm" (11/25/2012)  . AV FISTULA PLACEMENT Right 03/09/2014   Procedure: RIGHT AXILLARY EXPLORATION; PARTIAL REMOVOAL OF OLD ARTERIOVENOUS (AV) GORE-TEX GRAFT; LIGATION OF RIGHT AXILLARY VEIN; ULTRASOUND GUIDED;  Surgeon: Conrad Iron Belt, MD;  Location: [facility];  Service: Vascular;  Laterality: Right;  . AV FISTULA REPAIR Bilateral    "right/left arm fistula failed;  removed left thigh d/t poor circulation" (11/25/2012)  . CATARACT EXTRACTION, BILATERAL Bilateral   . COLONOSCOPY  2014?  . ESOPHAGOGASTRODUODENOSCOPY N/A 09/10/2013   Procedure: ESOPHAGOGASTRODUODENOSCOPY (EGD);  Surgeon: [name], MD;  Location: [name];  Service: Endoscopy;  Laterality: N/A;  . EXCHANGE OF A DIALYSIS CATHETER Right 06/11/2013   Procedure: EXCHANGE OF A DIALYSIS CATHETER;  Surgeon: Conrad Dorado, MD;  Location: [facility];  Service: Vascular;  Laterality: Right;  . EYE SURGERY    . FEMORAL-POPLITEAL BYPASS GRAFT  04/11/2012   Procedure: BYPASS GRAFT FEMORAL-POPLITEAL ARTERY;  Surgeon: [name], MD;  Location: [facility];  Service: Vascular;  Laterality: Left;  Thrombectomy/Left femoral-popliteal bypass with revision of proximal end and shortening of graft; intraoperative arteriogram; endarterectomy and patch angioplasty with distal anastomosis  . FEMORAL-POPLITEAL BYPASS GRAFT  10/09/2012   Procedure: BYPASS GRAFT FEMORAL-POPLITEAL ARTERY;  Surgeon: [name], MD;  Location: [facility];  Service: Vascular;  Laterality: Left;  Redo left tibioperoneal trunk bypass with Gortex Graft 59mmx80cm.  Marland Kitchen FISTULOGRAM Right 06/11/2013   Procedure: Venogram with angioplasty;  Surgeon: Conrad Crystal Lawns, MD;  Location: [facility];  Service: Vascular;  Laterality: Right;  RIGHT CENTRAL VENOGRAM WITH ANGIOPLASTY  . FOOT AMPUTATION THROUGH METATARSAL Left 06/22/11   "whole 5th toe" (11/25/2012)  . INSERTION OF DIALYSIS CATHETER Right 03/10/2014   Procedure: INSERTION OF TUNNELED  DIALYSIS CATHETER -attempted  RIGHT SUBCLAVIAN, RIGHT FEMORAL TUNNELED DIALYSIS CATHETER EXCHANGE with Inferior Vena Cava gram and Fibrin Sheath Angioplasty;  Surgeon: Conrad , MD;  Location: [facility];  Service: Vascular;  Laterality: Right;  . KYPHOPLASTY  11/25/2012   Procedure: KYPHOPLASTY;  Surgeon: Kristeen Miss, MD;  Location: [facility];  Service: Neurosurgery;  Laterality: N/A;  Lumbar two lumbar five Kyphoplasty    . PR VEIN BYPASS GRAFT,AORTO-FEM-POP  06/14/2011  . TOTAL ABDOMINAL HYSTERECTOMY  1960's   one ovary removed     reports that she has quit smoking. Her smoking use included cigarettes. She has a 1.80 pack-year smoking history. She has never used smokeless tobacco. She reports that she does not drink alcohol or use drugs.  Allergies  Allergen Reactions  . Ace Inhibitors Other (See Comments)    Reaction unknown    Family History  Problem Relation Age of Onset  . Peripheral vascular disease Mother        amputation  . Hypertension Mother   . Alcohol abuse Mother   . Arthritis Mother   . COPD Sister   . Heart attack Sister      Prior to Admission medications   Medication Sig Start Date End Date Taking? Authorizing Provider  acetaminophen (TYLENOL) 500 MG tablet Take 500 mg by mouth every 6 (six) hours as needed for moderate pain.    [provider]  amLODipine (NORVASC) 2.5 MG tablet TAKE 1 TABLET (2.5 MG TOTAL) BY MOUTH DAILY. 04/02/17   Sherise Spark, MD  aspirin 81 MG chewable tablet Chew 81 mg by mouth daily.    [provider]  cloNIDine (CATAPRES)  0.1 MG tablet Take 1 tablet (0.1 mg total) by mouth daily. 11/24/19 2020-01-12  Nafziger, Tommi Rumps, NP  latanoprost (XALATAN) 0.005 % ophthalmic solution 1 drop at bedtime.    [provider]  levothyroxine (SYNTHROID) 25 MCG tablet TAKE 1 TABLET BY MOUTH DAILY BEFORE BREAKFAST. 11/18/19   Nafziger, Tommi Rumps, NP  pantoprazole (PROTONIX) 40 MG tablet Take 40 mg by mouth at bedtime as needed (occasional).     [provider]  sevelamer carbonate (RENVELA) 800 MG tablet Take 800 mg by mouth as directed.    [provider]  traMADol (ULTRAM) 50 MG tablet Take 1 tablet (50 mg total) by mouth every 6 (six) hours as needed for moderate pain. 09/29/19   Newt Minion, MD    Physical Exam: Vitals:   12/18/2019 1815 12/02/2019 1830 12/10/2019 1911 12/30/2019 1930  BP: (!) 135/54 127/63 (!) 143/63 (!) 66/26   Pulse: (!) 109 (!) 109 (!) 110 (!) 108  Resp: 19 (!) 21 20 (!) 23  Temp:   99.8 F (37.7 C)   TempSrc:   Oral   SpO2: 99% 99% 99% 99%  Weight:      Height:        Constitutional: ill appearing female laying flat in bed shivering and drowsy Vitals:   12/28/2019 1815 12/19/2019 1830 12/18/2019 1911 12/12/2019 1930  BP: (!) 135/54 127/63 (!) 143/63 (!) 66/26  Pulse: (!) 109 (!) 109 (!) 110 (!) 108  Resp: 19 (!) 21 20 (!) 23  Temp:   99.8 F (37.7 C)   TempSrc:   Oral   SpO2: 99% 99% 99% 99%  Weight:      Height:       Eyes: PERRL, lids and conjunctivae normal ENMT: Mucous membranes are moist. Neck: normal, supple Respiratory: clear to auscultation bilaterally, no wheezing, no crackles. Normal respiratory effort.  Cardiovascular: Regular rate and rhythm, no murmurs / rubs / gallops. No extremity edema.  Abdomen: diffuse tenderness, no masses palpated.  Bowel sounds positive.  Musculoskeletal: no clubbing / cyanosis. No joint deformity upper and lower extremities.   Skin: no rashes, lesions, ulcers. No induration Neurologic: CN 2-12 grossly intact. Sensation intact. Weak hand grip bilaterally. 3/5 strength of bilateral lower extremity.  Psychiatric: Normal judgment and insight. Alert and oriented x 3. Normal mood.     Labs on Admission: I have personally reviewed following labs and imaging studies  CBC: Recent Labs  Lab 12/18/2019 1423  WBC 20.6*  NEUTROABS 17.6*  HGB 10.3*  HCT 32.7*  MCV 89.6  PLT 99991111   Basic Metabolic Panel: Recent Labs  Lab 12/21/2019 1340  NA 128*  K 4.0  CL 88*  CO2 21*  GLUCOSE 158*  BUN 57*  CREATININE 8.43*  CALCIUM 8.0*   GFR: Estimated Creatinine Clearance: 3.6 mL/min (A) (by C-G formula based on SCr of 8.43 mg/dL (H)). Liver Function Tests: Recent Labs  Lab 12/13/2019 1340  AST 28  ALT 13  ALKPHOS 90  BILITOT 0.4  PROT 7.4  ALBUMIN 2.7*   No results for input(s): LIPASE, AMYLASE in the last 168 hours. No results for input(s):  AMMONIA in the last 168 hours. Coagulation Profile: No results for input(s): INR, PROTIME in the last 168 hours. Cardiac Enzymes: No results for input(s): CKTOTAL, CKMB, CKMBINDEX, TROPONINI in the last 168 hours. BNP (last 3 results) No results for input(s): PROBNP in the last 8760 hours. HbA1C: No results for input(s): HGBA1C in the last 72 hours. CBG: No results for  input(s): GLUCAP in the last 168 hours. Lipid Profile: No results for input(s): CHOL, HDL, LDLCALC, TRIG, CHOLHDL, LDLDIRECT in the last 72 hours. Thyroid Function Tests: No results for input(s): TSH, T4TOTAL, FREET4, T3FREE, THYROIDAB in the last 72 hours. Anemia Panel: No results for input(s): VITAMINB12, FOLATE, FERRITIN, TIBC, IRON, RETICCTPCT in the last 72 hours. Urine analysis: No results found for: COLORURINE, APPEARANCEUR, LABSPEC, [place], GLUCOSEU, HGBUR, BILIRUBINUR, KETONESUR, PROTEINUR, UROBILINOGEN, NITRITE, LEUKOCYTESUR  Radiological Exams on Admission: CT ABDOMEN PELVIS WO CONTRAST  Addendum Date: 12/12/2019   ADDENDUM REPORT: 12/22/2019 16:49 ADDENDUM: These results were called by telephone at the time of interpretation on 12/31/2019 at 4:48 pm to provider Dr. Tyrell Antonio, who verbally acknowledged these results. Electronically Signed   By: Zetta Bills M.D.   On: 12/28/2019 16:49   Result Date: 12/14/2019 CLINICAL DATA:  Abdominal pain, generalized acute. EXAM: CT ABDOMEN AND PELVIS WITHOUT CONTRAST TECHNIQUE: Multidetector CT imaging of the abdomen and pelvis was performed following the standard protocol without IV contrast. COMPARISON:  04/10/2017 FINDINGS: Lower chest: Basilar atelectasis. Coronary artery disease likely with prior percutaneous coronary intervention. No pericardial effusion. Heart is incompletely imaged. Hepatobiliary: No signs of focal hepatic lesion. No pericholecystic stranding. No visible biliary ductal dilation on noncontrast imaging. Pancreas: Signs of pancreatic atrophy slightly  worse than on the prior study, no signs of ductal dilation. Spleen: Spleen is normal. Adrenals/Urinary Tract: Adrenal thickening bilaterally unchanged since 2018. Marked renal atrophy. Similar to prior study. Cyst in the upper pole the right kidney. No hydronephrosis. Urinary bladder is under distended limiting assessment. Stomach/Bowel: No signs of acute gastrointestinal process. Stomach is under distended limiting assessment. Scattered colonic diverticulosis no signs of diverticulitis. Appendix surgically absent by history. Vascular/Lymphatic: Extensive calcified atherosclerotic changes throughout the abdominal aorta. Retroperitoneal collateral pathways with similar appearance to prior study. No signs of adenopathy in the retroperitoneum or upper abdomen. No signs of pelvic lymphadenopathy. Reproductive: Post hysterectomy. Other: No signs of free air. Musculoskeletal: Extensive degenerative changes with signs of acute fracture involving the right lateral aspect of L3 vertebral body, superior endplate and osteophyte complex best seen on coronal image number 52 of series 6. Remote appearing fragmentation of the osteophyte on the left at this level Vertebroplasty changes at L2 are similar to the prior exam. Vertebroplasty changes at L5 with vertebra plana similar to the prior study. Osteopenia. In Multilevel spondylosis in the spine with ankylosis at the L3-4 level and L4-5 level. Ankylosis of facet joints is noted at multiple levels with bridging osteophytes at nearly all visualized levels of the spine. IMPRESSION: 1. Acute fracture involving the right lateral aspect of the L3 vertebral body, superior endplate and osteophyte complex. Multilevel spondylosis and ankylosis as described. 2. No acute intra-abdominal or pelvic findings. 3. Vascular disease and coronary artery calcification. 4. A call is out to the referring provider further discuss findings in the above case. Aortic Atherosclerosis (ICD10-I70.0).  Electronically Signed: By: Zetta Bills M.D. On: 12/23/2019 16:42   DG Pelvis 1-2 Views  Result Date: 12/10/2019 CLINICAL DATA:  Pain. EXAM: PELVIS - 1-2 VIEW COMPARISON:  Pelvis and right hip x-rays dated December 10, 2019. FINDINGS: No acute fracture or dislocation. Mild osteoarthritis of both hip and sacroiliac joints, similar to prior study. Prior L5 kyphoplasty. Osteopenia. Soft tissues are unremarkable. Vascular calcifications. IMPRESSION: No acute osseous abnormality. Electronically Signed   By: Titus Dubin M.D.   On: 12/27/2019 15:03   DG Chest Port 1 View  Result Date: 12/25/2019 CLINICAL DATA:  Cough. EXAM: PORTABLE CHEST 1 VIEW COMPARISON:  May 26, 2014 FINDINGS: A new right central line terminates near the caval atrial junction. No pneumothorax. The lungs are clear. The cardiomediastinal silhouette is stable. No overt edema, nodule, mass, or infiltrate. IMPRESSION: 1. The new right central line terminates near the caval atrial junction. No pneumothorax. 2. No other acute abnormalities. Electronically Signed   By: Dorise Bullion III M.D   On: 12/28/2019 13:27    EKG: Independently reviewed.   Assessment/Plan  Sepsis unclear etiology  - febrile up to 100.7. WBC of 20.6K - Negative CXR. Negative CT abdomen. COVID negative. She is anuric on dialysis.  - Continue Vancomycin and Cefepime pending blood culture  - Check RVP   Hypotension secondary to sepsis  - improved with 1L LR. Monitor BP closely and caution with fluid due to ESRD on HD  - hold amlodipine and clonidine  Elevated anion gap - suspect due to hypovolemia and elevated creatinine - follow BMP   Acute on chronic kidney disease on HD - creatinine of 8.43 on admit. Baseline around 4-5 - HD M/W/F - will need to consult nephrology in the morning for dialysis   Anemia  -Hgb of 10.3. Likely iron deficiency and from anemia of chronic disease Iron panel in Oct showed low normal iron and low transferrin - check  Vitamin [id number] and folic, FOBT as well  Hyponatremia - sodium of 128. Hypovolemic hyponatremia likely.  - follow BMP after fluids   L3 vertrebral fracture s/p fall - symptomatic control  - follow up outpatient   Hypothyroidism - continue levothyroxine  DVT prophylaxis:Heparin SQ Code Status:DNR Family Communication: Plan discussed with patient at bedside  disposition Plan: Home with at least 2 midnight stays  Consults called:  Admission status: inpatient    Aniel Hubble T Gisele Pack DO [facility]   If 7PM-7AM, please contact night-coverage [email or web address] [facility]  12/30/2019, 7:53 PM

## 2019-12-20 NOTE — Progress Notes (Signed)
Unable to draw lactic acid per sepsis protocol d/t difficult stick.

## 2019-12-20 NOTE — ED Provider Notes (Signed)
85yF with generalized weakness. Probably from febrile illness. Lumbar compression fracture also likely limiting mobility. Probably resulted from fall a couple weeks ago. Not sure of source of fever. COVID pending. ESRD. Doesn't make significant urine. Empiric abx ordered. Admit.    Virgel Manifold, MD 12/19/2019 [phone]

## 2019-12-20 NOTE — ED Notes (Addendum)
Pt hypotensive and obtunded, MD aware.

## 2019-12-21 ENCOUNTER — Inpatient Hospital Stay (HOSPITAL_COMMUNITY): Payer: Medicare Other

## 2019-12-21 ENCOUNTER — Other Ambulatory Visit: Payer: Self-pay | Admitting: Radiology

## 2019-12-21 DIAGNOSIS — E871 Hypo-osmolality and hyponatremia: Secondary | ICD-10-CM

## 2019-12-21 DIAGNOSIS — S32038D Other fracture of third lumbar vertebra, subsequent encounter for fracture with routine healing: Secondary | ICD-10-CM

## 2019-12-21 DIAGNOSIS — I959 Hypotension, unspecified: Secondary | ICD-10-CM

## 2019-12-21 DIAGNOSIS — E8729 Other acidosis: Secondary | ICD-10-CM

## 2019-12-21 DIAGNOSIS — N179 Acute kidney failure, unspecified: Secondary | ICD-10-CM

## 2019-12-21 DIAGNOSIS — N186 End stage renal disease: Secondary | ICD-10-CM

## 2019-12-21 DIAGNOSIS — E872 Acidosis: Secondary | ICD-10-CM

## 2019-12-21 DIAGNOSIS — S32039A Unspecified fracture of third lumbar vertebra, initial encounter for closed fracture: Secondary | ICD-10-CM

## 2019-12-21 DIAGNOSIS — D631 Anemia in chronic kidney disease: Secondary | ICD-10-CM

## 2019-12-21 HISTORY — PX: IR REMOVAL TUN CV CATH W/O FL: IMG2289

## 2019-12-21 LAB — COMPREHENSIVE METABOLIC PANEL
ALT: 10 U/L (ref 0–44)
AST: 23 U/L (ref 15–41)
Albumin: 1.9 g/dL — ABNORMAL LOW (ref 3.5–5.0)
Alkaline Phosphatase: 78 U/L (ref 38–126)
Anion gap: 23 — ABNORMAL HIGH (ref 5–15)
BUN: 57 mg/dL — ABNORMAL HIGH (ref 8–23)
CO2: 18 mmol/L — ABNORMAL LOW (ref 22–32)
Calcium: 7.6 mg/dL — ABNORMAL LOW (ref 8.9–10.3)
Chloride: 86 mmol/L — ABNORMAL LOW (ref 98–111)
Creatinine, Ser: 8.38 mg/dL — ABNORMAL HIGH (ref 0.44–1.00)
GFR calc Af Amer: 5 mL/min — ABNORMAL LOW (ref >60)
GFR calc non Af Amer: 4 mL/min — ABNORMAL LOW (ref >60)
Glucose, Bld: 130 mg/dL — ABNORMAL HIGH (ref 70–99)
Potassium: 4 mmol/L (ref 3.5–5.1)
Sodium: 127 mmol/L — ABNORMAL LOW (ref 135–145)
Total Bilirubin: 0.8 mg/dL (ref 0.3–1.2)
Total Protein: 6.2 g/dL — ABNORMAL LOW (ref 6.5–8.1)

## 2019-12-21 LAB — BLOOD CULTURE ID PANEL (REFLEXED)

## 2019-12-21 LAB — RESPIRATORY PANEL BY PCR

## 2019-12-21 LAB — LACTIC ACID, PLASMA: Lactic Acid, Venous: 1.8 mmol/L (ref 0.5–1.9)

## 2019-12-21 LAB — GLUCOSE, CAPILLARY
Glucose-Capillary: 154 mg/dL — ABNORMAL HIGH (ref 70–99)
Glucose-Capillary: 159 mg/dL — ABNORMAL HIGH (ref 70–99)
Glucose-Capillary: 159 mg/dL — ABNORMAL HIGH (ref 70–99)

## 2019-12-21 LAB — VITAMIN B12: Vitamin B-12: 2625 pg/mL — ABNORMAL HIGH (ref 180–914)

## 2019-12-21 MED ORDER — CHLORHEXIDINE GLUCONATE 4 % EX LIQD
CUTANEOUS | Status: AC
  Filled 2019-12-21: qty 15

## 2019-12-21 MED ORDER — VANCOMYCIN HCL 500 MG/100ML IV SOLN
500.0000 mg | INTRAVENOUS | Status: DC
  Filled 2019-12-21 (×2): qty 100

## 2019-12-21 MED ORDER — LIDOCAINE HCL 1 % IJ SOLN
INTRAMUSCULAR | Status: DC | PRN
  Administered 2019-12-21: 5 mL

## 2019-12-21 MED ORDER — ACETAMINOPHEN 325 MG PO TABS
650.0000 mg | ORAL_TABLET | Freq: Four times a day (QID) | ORAL | Status: DC | PRN
  Filled 2019-12-21: qty 2

## 2019-12-21 MED ORDER — CHLORHEXIDINE GLUCONATE CLOTH 2 % EX PADS
6.0000 | MEDICATED_PAD | Freq: Every day | CUTANEOUS | Status: DC
  Administered 2019-12-22: 6 via TOPICAL

## 2019-12-21 MED ORDER — SEVELAMER CARBONATE 800 MG PO TABS
800.0000 mg | ORAL_TABLET | Freq: Three times a day (TID) | ORAL | Status: DC
  Administered 2019-12-22: 800 mg via ORAL
  Filled 2019-12-21: qty 1

## 2019-12-21 MED ORDER — SEVELAMER CARBONATE 800 MG PO TABS
800.0000 mg | ORAL_TABLET | Freq: Three times a day (TID) | ORAL | Status: DC
  Administered 2019-12-21: 800 mg via ORAL
  Filled 2019-12-21: qty 1

## 2019-12-21 MED ORDER — NEPRO/CARBSTEADY PO LIQD
237.0000 mL | Freq: Three times a day (TID) | ORAL | Status: DC
  Administered 2019-12-21: 237 mL via ORAL

## 2019-12-21 MED ORDER — CHLORHEXIDINE GLUCONATE CLOTH 2 % EX PADS
6.0000 | MEDICATED_PAD | Freq: Every day | CUTANEOUS | Status: DC
  Administered 2019-12-21: 6 via TOPICAL

## 2019-12-21 MED ORDER — DARBEPOETIN ALFA 150 MCG/0.3ML IJ SOSY
150.0000 ug | PREFILLED_SYRINGE | INTRAMUSCULAR | Status: DC
  Filled 2019-12-21: qty 0.3

## 2019-12-21 MED ORDER — LIDOCAINE HCL 1 % IJ SOLN
INTRAMUSCULAR | Status: AC
  Filled 2019-12-21: qty 20

## 2019-12-21 MED ORDER — NEPRO/CARBSTEADY PO LIQD
237.0000 mL | Freq: Three times a day (TID) | ORAL | Status: DC
  Administered 2019-12-22: 237 mL via ORAL

## 2019-12-21 MED ORDER — RENA-VITE PO TABS: 1.0000 | ORAL_TABLET | Freq: Every day | ORAL | Status: DC

## 2019-12-21 MED ORDER — ASPIRIN 81 MG PO CHEW
81.0000 mg | CHEWABLE_TABLET | Freq: Every day | ORAL | Status: DC
  Administered 2019-12-21 – 2019-12-22 (×2): 81 mg via ORAL
  Filled 2019-12-21 (×2): qty 1

## 2019-12-21 MED ORDER — MORPHINE SULFATE (PF) 2 MG/ML IV SOLN
1.0000 mg | Freq: Once | INTRAVENOUS | Status: AC
  Administered 2019-12-21: 1 mg via INTRAVENOUS
  Filled 2019-12-21: qty 1

## 2019-12-21 MED ORDER — DOXERCALCIFEROL 4 MCG/2ML IV SOLN
1.0000 ug | INTRAVENOUS | Status: DC
  Administered 2019-12-23: 1 ug via INTRAVENOUS
  Filled 2019-12-21 (×3): qty 2

## 2019-12-21 MED ORDER — LEVOTHYROXINE SODIUM 25 MCG PO TABS
25.0000 ug | ORAL_TABLET | Freq: Every day | ORAL | Status: DC
  Administered 2019-12-21 – 2019-12-22 (×2): 25 ug via ORAL
  Filled 2019-12-21 (×2): qty 1

## 2019-12-21 NOTE — Procedures (Signed)
Successful removal of tunneled (R)IJ HD cath No complications.  Ascencion Dike PA-C Interventional Radiology 12/21/2019 2:59 PM

## 2019-12-21 NOTE — Progress Notes (Addendum)
PHARMACY - PHYSICIAN COMMUNICATION CRITICAL VALUE ALERT - BLOOD CULTURE IDENTIFICATION (BCID)  [name] is an 85 y.o. female who presented to [facility] on 12/14/2019 with a chief complaint of weakness, loss of appetite and issues with clotting of her HD cath.   Assessment:  GPC in anaerobic bottle only speciated to coag negative staph MecA neg, The GVR in the aerobic bottle is still waiting to be speciated   Name of physician (or Provider) Contacted: Dr. Jacinta Shoe  Current antibiotics: cefepime and vancomycin   Changes to prescribed antibiotics recommended:  Recommendations accepted by provider  Continue current therapy for now  No results found for this or any previous visit.  [name] [facility] 12/21/2019  10:07 AM

## 2019-12-21 NOTE — Progress Notes (Signed)
PROGRESS NOTE    [name]  [id number] DOB: Jul 09, 1934 DOA: 12/19/2019 PCP: Dorothyann Peng, NP  Brief Narrative: [name] is an 85 year old chronically ill African-American female with history of ESRD on hemodialysis Monday Wednesday Friday, PAD, history of mesenteric ischemia, hypertension, CAD, type 2 diabetes mellitus, asthma presented to the ED with weakness, loss of appetite, she is also been having issues with clotting of her HD catheter and was supposed to have this exchange sometime this week, missed dialysis on Monday and Wednesday but had it on Friday. -She denies any nausea vomiting abdominal pain fevers or chills, she has a chronic cough, main symptom is generalized weakness, in the ED she was febrile to 100.7 with hypotension, blood pressure dropped into the 60s, white count was 20K, CT abdomen pelvis shows no acute abdominal findings noted in L3 fracture, chest x-ray was unremarkable, Covid was negative   Assessment & Plan:   Sepsis -Admitted with fever of 100.7, white count of 20,000, hypotension with blood pressure dropping to 66 mmHg -Source is unclear at this time, she could have a line infection related to her right IJ HD catheter -COVID-19 PCR negative, CT abdomen pelvis, respiratory virus panel all unremarkable -Continue IV vancomycin and cefepime -Discussed with nephrology, she has been having problems with her dialysis catheter which could very well be infected now, follow blood cultures, plan to remove HD catheter per nephrology post HD today, Renal MD -please order this after HD  Hypotension -Due to above, given saline bolus in the ED, BP stable now, hold amlodipine and clonidine  ESRD on hemodialysis Monday Wednesday Friday -Missed HD on Monday and Wednesday, had this on Friday -Nephrology consulting, plan for HD today  Anemia of chronic disease and iron deficiency -Stable, monitor  L3 compression fracture -Status post fall, supportive care,  symptom management -PT eval  Hypothyroidism -Continue Synthroid  DVT prophylaxis: Heparin subcutaneous Code Status: DNR Family Communication: No family at bedside Disposition Plan: Home likely in 2 to 3 days  Consultants:   Nephrology   Procedures:   Antimicrobials:    Subjective: -Feels weak and shaky this morning, she had a fever last night  Objective: Vitals:   12/13/2019 2357 12/21/19 0005 12/21/19 0522 12/21/19 0739  BP: 120/85  128/78 114/62  Pulse: 100  (!) 18 (!) 113  Resp: 20  20 18   Temp: 98.4 F (36.9 C)  98.5 F (36.9 C) 97.8 F (36.6 C)  TempSrc: Oral  Oral Oral  SpO2: 97%  99%   Weight:  55 kg    Height:        Intake/Output Summary (Last 24 hours) at 12/21/2019 [id number] Last data filed at 12/21/2019 0500 Gross per 24 hour  Intake 3010.99 ml  Output 0 ml  Net 3010.99 ml   Filed Weights   12/10/2019 1210 12/21/19 0005  Weight: 52.6 kg 55 kg    Examination:  General exam: Elderly frail chronically ill-appearing female, alert awake, oriented to self and place only Respiratory system: Decreased breath sounds the bases, otherwise clear, right IJ HD catheter noted Cardiovascular system: S1 & S2 heard, RRR. Gastrointestinal system: Abdomen is nondistended, soft and nontender.Normal bowel sounds heard. Central nervous system: Alert and oriented. No focal neurological deficits. Extremities: No edema Skin: No rashes, lesions or ulcers Psychiatry: Flat affect    Data Reviewed:   CBC: Recent Labs  Lab 12/31/2019 1423  WBC 20.6*  NEUTROABS 17.6*  HGB 10.3*  HCT 32.7*  MCV 89.6  PLT 191  Basic Metabolic Panel: Recent Labs  Lab 12/21/2019 1340 12/21/19 0024  NA 128* 127*  K 4.0 4.0  CL 88* 86*  CO2 21* 18*  GLUCOSE 158* 130*  BUN 57* 57*  CREATININE 8.43* 8.38*  CALCIUM 8.0* 7.6*   GFR: Estimated Creatinine Clearance: 3.7 mL/min (A) (by C-G formula based on SCr of 8.38 mg/dL (H)). Liver Function Tests: Recent Labs  Lab 12/25/2019 1340  12/21/19 0024  AST 28 23  ALT 13 10  ALKPHOS 90 78  BILITOT 0.4 0.8  PROT 7.4 6.2*  ALBUMIN 2.7* 1.9*   No results for input(s): LIPASE, AMYLASE in the last 168 hours. No results for input(s): AMMONIA in the last 168 hours. Coagulation Profile: No results for input(s): INR, PROTIME in the last 168 hours. Cardiac Enzymes: No results for input(s): CKTOTAL, CKMB, CKMBINDEX, TROPONINI in the last 168 hours. BNP (last 3 results) No results for input(s): PROBNP in the last 8760 hours. HbA1C: No results for input(s): HGBA1C in the last 72 hours. CBG: Recent Labs  Lab 12/21/19 0003 12/21/19 0612  GLUCAP 159* 154*   Lipid Profile: No results for input(s): CHOL, HDL, LDLCALC, TRIG, CHOLHDL, LDLDIRECT in the last 72 hours. Thyroid Function Tests: No results for input(s): TSH, T4TOTAL, FREET4, T3FREE, THYROIDAB in the last 72 hours. Anemia Panel: Recent Labs    12/21/19 0430  VITAMINB12 2,625*   Urine analysis: No results found for: COLORURINE, APPEARANCEUR, LABSPEC, PHURINE, GLUCOSEU, HGBUR, BILIRUBINUR, KETONESUR, PROTEINUR, UROBILINOGEN, NITRITE, LEUKOCYTESUR Sepsis Labs: @LABRCNTIP (procalcitonin:4,lacticidven:4)  ) Recent Results (from the past 240 hour(s))  SARS CORONAVIRUS 2 (TAT 6-24 HRS) Nasopharyngeal Nasopharyngeal Swab     Status: None   Collection Time: 12/23/2019 12:17 PM   Specimen: Nasopharyngeal Swab  Result Value Ref Range Status   SARS Coronavirus 2 NEGATIVE NEGATIVE Final    Comment: (NOTE) SARS-CoV-2 target nucleic acids are NOT DETECTED. The SARS-CoV-2 RNA is generally detectable in upper and lower respiratory specimens during the acute phase of infection. Negative results do not preclude SARS-CoV-2 infection, do not rule out co-infections with other pathogens, and should not be used as the sole basis for treatment or other patient management decisions. Negative results must be combined with clinical observations, patient history, and epidemiological  information. The expected result is Negative. Fact Sheet for Patients: SugarRoll.be Fact Sheet for Healthcare Providers: https://www.woods-mathews.com/ This test is not yet approved or cleared by the [place] FDA and  has been authorized for detection and/or diagnosis of SARS-CoV-2 by FDA under an Emergency Use Authorization (EUA). This EUA will remain  in effect (meaning this test can be used) for the duration of the COVID-19 declaration under Section 56 4(b)(1) of the Act, 21 U.S.C. section 360bbb-3(b)(1), unless the authorization is terminated or revoked sooner. Performed at [facility], [place] [address] [place], [place] [address]   Blood culture (routine x 2)     Status: Abnormal (Preliminary result)   Collection Time: 12/16/2019  7:00 PM   Specimen: BLOOD RIGHT ARM  Result Value Ref Range Status   Specimen Description BLOOD RIGHT ARM  Final   Special Requests   Final    BOTTLES DRAWN AEROBIC AND ANAEROBIC Blood Culture results may not be optimal due to an inadequate volume of blood received in culture bottles   Culture  Setup Time (A)  Final    GRAM VARIABLE ROD AEROBIC BOTTLE ONLY Organism ID to follow    Culture   Final    NO GROWTH < 12 HOURS Performed at  [facility], [place] [address], [place], [place] [address]    Report Status PENDING  Incomplete  Respiratory Panel by PCR     Status: None   Collection Time: 12/13/2019 10:07 PM   Specimen: Nasopharyngeal Swab; Respiratory  Result Value Ref Range Status   Adenovirus NOT DETECTED NOT DETECTED Final   Coronavirus 229E NOT DETECTED NOT DETECTED Final    Comment: (NOTE) The Coronavirus on the Respiratory Panel, DOES NOT test for the novel  Coronavirus (2019 nCoV)    Coronavirus HKU1 NOT DETECTED NOT DETECTED Final   Coronavirus NL63 NOT DETECTED NOT DETECTED Final   Coronavirus OC43 NOT DETECTED NOT DETECTED Final   Metapneumovirus NOT DETECTED NOT DETECTED Final    Rhinovirus / Enterovirus NOT DETECTED NOT DETECTED Final   Influenza A NOT DETECTED NOT DETECTED Final   Influenza B NOT DETECTED NOT DETECTED Final   Parainfluenza Virus 1 NOT DETECTED NOT DETECTED Final   Parainfluenza Virus 2 NOT DETECTED NOT DETECTED Final   Parainfluenza Virus 3 NOT DETECTED NOT DETECTED Final   Parainfluenza Virus 4 NOT DETECTED NOT DETECTED Final   Respiratory Syncytial Virus NOT DETECTED NOT DETECTED Final   Bordetella pertussis NOT DETECTED NOT DETECTED Final   Chlamydophila pneumoniae NOT DETECTED NOT DETECTED Final   Mycoplasma pneumoniae NOT DETECTED NOT DETECTED Final    Comment: Performed at [facility], [place]. [address] [place], [place] [address]         Radiology Studies: CT ABDOMEN PELVIS WO CONTRAST  Addendum Date: 12/21/2019   ADDENDUM REPORT: 12/07/2019 16:49 ADDENDUM: These results were called by telephone at the time of interpretation on 12/21/2019 at 4:48 pm to provider Dr. Tyrell Antonio, who verbally acknowledged these results. Electronically Signed   By: Zetta Bills M.D.   On: 12/19/2019 16:49   Result Date: 12/31/2019 CLINICAL DATA:  Abdominal pain, generalized acute. EXAM: CT ABDOMEN AND PELVIS WITHOUT CONTRAST TECHNIQUE: Multidetector CT imaging of the abdomen and pelvis was performed following the standard protocol without IV contrast. COMPARISON:  04/10/2017 FINDINGS: Lower chest: Basilar atelectasis. Coronary artery disease likely with prior percutaneous coronary intervention. No pericardial effusion. Heart is incompletely imaged. Hepatobiliary: No signs of focal hepatic lesion. No pericholecystic stranding. No visible biliary ductal dilation on noncontrast imaging. Pancreas: Signs of pancreatic atrophy slightly worse than on the prior study, no signs of ductal dilation. Spleen: Spleen is normal. Adrenals/Urinary Tract: Adrenal thickening bilaterally unchanged since 2018. Marked renal atrophy. Similar to prior study. Cyst in the upper  pole the right kidney. No hydronephrosis. Urinary bladder is under distended limiting assessment. Stomach/Bowel: No signs of acute gastrointestinal process. Stomach is under distended limiting assessment. Scattered colonic diverticulosis no signs of diverticulitis. Appendix surgically absent by history. Vascular/Lymphatic: Extensive calcified atherosclerotic changes throughout the abdominal aorta. Retroperitoneal collateral pathways with similar appearance to prior study. No signs of adenopathy in the retroperitoneum or upper abdomen. No signs of pelvic lymphadenopathy. Reproductive: Post hysterectomy. Other: No signs of free air. Musculoskeletal: Extensive degenerative changes with signs of acute fracture involving the right lateral aspect of L3 vertebral body, superior endplate and osteophyte complex best seen on coronal image number 52 of series 6. Remote appearing fragmentation of the osteophyte on the left at this level Vertebroplasty changes at L2 are similar to the prior exam. Vertebroplasty changes at L5 with vertebra plana similar to the prior study. Osteopenia. In Multilevel spondylosis in the spine with ankylosis at the L3-4 level and L4-5 level. Ankylosis of facet joints is  noted at multiple levels with bridging osteophytes at nearly all visualized levels of the spine. IMPRESSION: 1. Acute fracture involving the right lateral aspect of the L3 vertebral body, superior endplate and osteophyte complex. Multilevel spondylosis and ankylosis as described. 2. No acute intra-abdominal or pelvic findings. 3. Vascular disease and coronary artery calcification. 4. A call is out to the referring provider further discuss findings in the above case. Aortic Atherosclerosis (ICD10-I70.0). Electronically Signed: By: Zetta Bills M.D. On: 12/10/2019 16:42   DG Pelvis 1-2 Views  Result Date: 12/16/2019 CLINICAL DATA:  Pain. EXAM: PELVIS - 1-2 VIEW COMPARISON:  Pelvis and right hip x-rays dated December 10, 2019.  FINDINGS: No acute fracture or dislocation. Mild osteoarthritis of both hip and sacroiliac joints, similar to prior study. Prior L5 kyphoplasty. Osteopenia. Soft tissues are unremarkable. Vascular calcifications. IMPRESSION: No acute osseous abnormality. Electronically Signed   By: Titus Dubin M.D.   On: 12/02/2019 15:03   DG Chest Port 1 View  Result Date: 12/15/2019 CLINICAL DATA:  Cough. EXAM: PORTABLE CHEST 1 VIEW COMPARISON:  May 26, 2014 FINDINGS: A new right central line terminates near the caval atrial junction. No pneumothorax. The lungs are clear. The cardiomediastinal silhouette is stable. No overt edema, nodule, mass, or infiltrate. IMPRESSION: 1. The new right central line terminates near the caval atrial junction. No pneumothorax. 2. No other acute abnormalities. Electronically Signed   By: Dorise Bullion III M.D   On: 12/16/2019 13:27        Scheduled Meds: . aspirin  81 mg Oral Daily  . Chlorhexidine Gluconate Cloth  6 each Topical Daily  . heparin  5,000 Units Subcutaneous Q8H  . levothyroxine  25 mcg Oral Q0600  . sevelamer carbonate  800 mg Oral TID WC  . vancomycin variable dose per unstable renal function (pharmacist dosing)   Does not apply See admin instructions   Continuous Infusions: . ceFEPime (MAXIPIME) IV       LOS: 1 day    Time spent:67min Domenic Polite, MD [facility]   12/21/2019, 9:46 AM

## 2019-12-21 NOTE — Plan of Care (Signed)
  Problem: Education: Goal: Knowledge of General Education information will improve Description: Including pain rating scale, medication(s)/side effects and non-pharmacologic comfort measures Outcome: Progressing   Problem: Clinical Measurements: Goal: Ability to maintain clinical measurements within normal limits will improve Outcome: Progressing   

## 2019-12-21 NOTE — Consult Note (Addendum)
[facility] Renal Consultation Note    Indication for Consultation:  Management of ESRD/hemodialysis; anemia, hypertension/volume and secondary hyperparathyroidism PCP: Dorothyann Peng, NP  HPI: [name] is a 85 y.o. female with ESRD on MWF HD at [facility] on dialysis over 12 years with multiple failed dialysis accesses, HTN, hx PVD, L2 and L5 kyphopasty, hx mesenteric ischemia who presented to th ED complaining of weakness after a fall she had several days prior to presentation.  She has had some problems with catheter function and was due to have it replaced by [facility] this week.  She was afebrile at her last HD treatment but was found to have a temp of 100.7 and hypotensive upon presentation.  WBC was up to 20.k sats ok NA 128 glu 158 K 4  COVID test was negative.  CT abd showed acute fx of L3 negative pelvic and right hip xray.  CXR was negative for volume - she has not been getting to edw lately but also signs off dialysis a little early.  Last post HD wt was 52.2.  BP was much lower than usual at her last HD treatment which was suprising giving her higher than usual volume due to having missed HD 12/14 and 12/16.  Initial BC drawn last evening are growing MS coag neg staph, gram variable rods not speciated yet.  Nursing tells me she has been sweating on and off this am and twitching and states this is sepsis related.  She is acutely ill any unable to give any history.  Past Medical History:  Diagnosis Date   Arthritis    "all over" (03/09/2014)   Asthma    "used to; not anymore" (03/09/2014)   Blood transfusion 1960's   Chronic anemia    Chronic back pain    ESRD (end stage renal disease) on dialysis ([place])    "M, W, . Hansen."  (04/10/2017)   Gangrene ([place])    left fifth toe   GERD (gastroesophageal reflux disease)    hx (03/09/2014)   Gout, unspecified 1980's   "not anymore" (03/09/2014)   Hyperlipidemia    Hypertension    Iliopsoas muscle hematoma 04/10/2017   Mild  pulmonary hypertension ([place])    PAD (peripheral artery disease) ([place])    a. amputation of L toe 2013, with left external iliac artery to tibioperoneal trunk bypass graft with endarterectomy if the tibioperoneal trunk and anterior tibial artery origin in October 2013.   Pneumonia    "once; years ago" (03/09/2014)   Shoulder fracture, right 2012   Type II diabetes mellitus ([place])    "not on any medication at this time" (03/09/2014)   Past Surgical History:  Procedure Laterality Date   APPENDECTOMY  1960's   ARTERIOVENOUS GRAFT PLACEMENT Left    femoral loop arteriovenous Gore-Tex graft.   AV FISTULA PLACEMENT  2008- 2013   "left upper arm; twice in my neck; left leg; removed from left leg; right upper arm" (11/25/2012)   AV FISTULA PLACEMENT Right 03/09/2014   Procedure: RIGHT AXILLARY EXPLORATION; PARTIAL REMOVOAL OF OLD ARTERIOVENOUS (AV) GORE-TEX GRAFT; LIGATION OF RIGHT AXILLARY VEIN; ULTRASOUND GUIDED;  Surgeon: Conrad Atmautluak, MD;  Location: [place] [facility];  Service: Vascular;  Laterality: Right;   AV FISTULA REPAIR Bilateral    "right/left arm fistula failed; removed left thigh d/t poor circulation" (11/25/2012)   CATARACT EXTRACTION, BILATERAL Bilateral    COLONOSCOPY  2014?   ESOPHAGOGASTRODUODENOSCOPY N/A 09/10/2013   Procedure: ESOPHAGOGASTRODUODENOSCOPY (EGD);  Surgeon: Nancy Fetter  Brooke Bonito., MD;  Location: [name];  Service: Endoscopy;  Laterality: N/A;   EXCHANGE OF A DIALYSIS CATHETER Right 06/11/2013   Procedure: EXCHANGE OF A DIALYSIS CATHETER;  Surgeon: Conrad Centennial, MD;  Location: [place];  Service: Vascular;  Laterality: Right;   EYE SURGERY     FEMORAL-POPLITEAL BYPASS GRAFT  04/11/2012   Procedure: BYPASS GRAFT FEMORAL-POPLITEAL ARTERY;  Surgeon: [name], MD;  Location: [place];  Service: Vascular;  Laterality: Left;  Thrombectomy/Left femoral-popliteal bypass with revision of proximal end and shortening of graft; intraoperative arteriogram; endarterectomy and patch angioplasty  with distal anastomosis   FEMORAL-POPLITEAL BYPASS GRAFT  10/09/2012   Procedure: BYPASS GRAFT FEMORAL-POPLITEAL ARTERY;  Surgeon: [name], MD;  Location: [facility];  Service: Vascular;  Laterality: Left;  Redo left tibioperoneal trunk bypass with Gortex Graft 54mmx80cm.   FISTULOGRAM Right 06/11/2013   Procedure: Venogram with angioplasty;  Surgeon: Conrad Pamlico, MD;  Location: [place];  Service: Vascular;  Laterality: Right;  RIGHT CENTRAL VENOGRAM WITH ANGIOPLASTY   FOOT AMPUTATION THROUGH METATARSAL Left 06/22/11   "whole 5th toe" (11/25/2012)   INSERTION OF DIALYSIS CATHETER Right 03/10/2014   Procedure: INSERTION OF TUNNELED  DIALYSIS CATHETER -attempted  RIGHT SUBCLAVIAN, RIGHT FEMORAL TUNNELED DIALYSIS CATHETER EXCHANGE with Inferior Vena Cava gram and Fibrin Sheath Angioplasty;  Surgeon: Conrad [place]ageville, MD;  Location: [place];  Service: Vascular;  Laterality: Right;   KYPHOPLASTY  11/25/2012   Procedure: KYPHOPLASTY;  Surgeon: Kristeen Miss, MD;  Location: [facility];  Service: Neurosurgery;  Laterality: N/A;  Lumbar two lumbar five Kyphoplasty   PR VEIN BYPASS GRAFT,AORTO-FEM-POP  06/14/2011   TOTAL ABDOMINAL HYSTERECTOMY  1960's   one ovary removed   Family History  Problem Relation Age of Onset   Peripheral vascular disease Mother        amputation   Hypertension Mother    Alcohol abuse Mother    Arthritis Mother    COPD Sister    Heart attack Sister    Social History:  reports that she has quit smoking. Her smoking use included cigarettes. She has a 1.80 pack-year smoking history. She has never used smokeless tobacco. She reports that she does not drink alcohol or use drugs. Allergies  Allergen Reactions   Ace Inhibitors Other (See Comments)    Reaction unknown   Prior to Admission medications   Medication Sig Start Date End Date Taking? Authorizing Provider  acetaminophen (TYLENOL) 500 MG tablet Take 500 mg by mouth every 6 (six) hours as needed for moderate pain.     [provider]  amLODipine (NORVASC) 2.5 MG tablet TAKE 1 TABLET (2.5 MG TOTAL) BY MOUTH DAILY. 04/02/17   Sohana Spark, MD  aspirin 81 MG chewable tablet Chew 81 mg by mouth daily.    [provider]  cloNIDine (CATAPRES) 0.1 MG tablet Take 1 tablet (0.1 mg total) by mouth daily. 11/24/19 01-11-2020  Nafziger, Tommi Rumps, NP  latanoprost (XALATAN) 0.005 % ophthalmic solution 1 drop at bedtime.    [provider]  levothyroxine (SYNTHROID) 25 MCG tablet TAKE 1 TABLET BY MOUTH DAILY BEFORE BREAKFAST. 11/18/19   Nafziger, Tommi Rumps, NP  pantoprazole (PROTONIX) 40 MG tablet Take 40 mg by mouth at bedtime as needed (occasional).     [provider]  sevelamer carbonate (RENVELA) 800 MG tablet Take 800 mg by mouth as directed.    [provider]  traMADol (ULTRAM) 50 MG tablet Take 1 tablet (50 mg total) by mouth  every 6 (six) hours as needed for moderate pain. 09/29/19   Newt Minion, MD   Current Facility-Administered Medications  Medication Dose Route Frequency Provider Last Rate Last Admin   aspirin chewable tablet 81 mg  81 mg Oral Daily Tu, Ching T, DO   81 mg at 12/21/19 0911   ceFEPIme (MAXIPIME) 1 g in sodium chloride 0.9 % 100 mL IVPB  1 g Intravenous Q24H Romona Curls, [facility]       Chlorhexidine Gluconate Cloth 2 % PADS 6 each  6 each Topical Q0600 Alric Seton, PA-C       Darbepoetin Alfa (ARANESP) injection 150 mcg  150 mcg Intravenous Q Mon-HD Alric Seton, PA-C       doxercalciferol (HECTOROL) injection 1 mcg  1 mcg Intravenous Q M,W,F-HD Alric Seton, PA-C       heparin injection 5,000 Units  5,000 Units Subcutaneous Q8H Tu, Ching T, DO   5,000 Units at 12/21/19 0454   levothyroxine (SYNTHROID) tablet 25 mcg  25 mcg Oral Q0600 Tu, Ching T, DO   25 mcg at 12/21/19 0454   sevelamer carbonate (RENVELA) tablet 800 mg  800 mg Oral TID WC Tu, Ching T, DO   800 mg at 12/21/19 0745   vancomycin (VANCOREADY) IVPB 500 mg/100 mL  500 mg Intravenous Q  M,W,F-HD Domenic Polite, MD       Labs: Basic Metabolic Panel: Recent Labs  Lab 12/28/2019 1340 12/21/19 0024  NA 128* 127*  K 4.0 4.0  CL 88* 86*  CO2 21* 18*  GLUCOSE 158* 130*  BUN 57* 57*  CREATININE 8.43* 8.38*  CALCIUM 8.0* 7.6*   Liver Function Tests: Recent Labs  Lab 12/23/2019 1340 12/21/19 0024  AST 28 23  ALT 13 10  ALKPHOS 90 78  BILITOT 0.4 0.8  PROT 7.4 6.2*  ALBUMIN 2.7* 1.9*   No results for input(s): LIPASE, AMYLASE in the last 168 hours. No results for input(s): AMMONIA in the last 168 hours. CBC: Recent Labs  Lab 12/12/2019 1423  WBC 20.6*  NEUTROABS 17.6*  HGB 10.3*  HCT 32.7*  MCV 89.6  PLT 191   Cardiac Enzymes: No results for input(s): CKTOTAL, CKMB, CKMBINDEX, TROPONINI in the last 168 hours. CBG: Recent Labs  Lab 12/21/19 0003 12/21/19 0612  GLUCAP 159* 154*   Iron Studies: No results for input(s): IRON, TIBC, TRANSFERRIN, FERRITIN in the last 72 hours. Studies/Results: CT ABDOMEN PELVIS WO CONTRAST  Addendum Date: 12/14/2019   ADDENDUM REPORT: 12/09/2019 16:49 ADDENDUM: These results were called by telephone at the time of interpretation on 12/27/2019 at 4:48 pm to provider Dr. Tyrell Antonio, who verbally acknowledged these results. Electronically Signed   By: Zetta Bills M.D.   On: 12/06/2019 16:49   Result Date: 12/26/2019 CLINICAL DATA:  Abdominal pain, generalized acute. EXAM: CT ABDOMEN AND PELVIS WITHOUT CONTRAST TECHNIQUE: Multidetector CT imaging of the abdomen and pelvis was performed following the standard protocol without IV contrast. COMPARISON:  04/10/2017 FINDINGS: Lower chest: Basilar atelectasis. Coronary artery disease likely with prior percutaneous coronary intervention. No pericardial effusion. Heart is incompletely imaged. Hepatobiliary: No signs of focal hepatic lesion. No pericholecystic stranding. No visible biliary ductal dilation on noncontrast imaging. Pancreas: Signs of pancreatic atrophy slightly worse than  on the prior study, no signs of ductal dilation. Spleen: Spleen is normal. Adrenals/Urinary Tract: Adrenal thickening bilaterally unchanged since 2018. Marked renal atrophy. Similar to prior study. Cyst in the upper pole the right kidney. No hydronephrosis. Urinary bladder is  under distended limiting assessment. Stomach/Bowel: No signs of acute gastrointestinal process. Stomach is under distended limiting assessment. Scattered colonic diverticulosis no signs of diverticulitis. Appendix surgically absent by history. Vascular/Lymphatic: Extensive calcified atherosclerotic changes throughout the abdominal aorta. Retroperitoneal collateral pathways with similar appearance to prior study. No signs of adenopathy in the retroperitoneum or upper abdomen. No signs of pelvic lymphadenopathy. Reproductive: Post hysterectomy. Other: No signs of free air. Musculoskeletal: Extensive degenerative changes with signs of acute fracture involving the right lateral aspect of L3 vertebral body, superior endplate and osteophyte complex best seen on coronal image number 52 of series 6. Remote appearing fragmentation of the osteophyte on the left at this level Vertebroplasty changes at L2 are similar to the prior exam. Vertebroplasty changes at L5 with vertebra plana similar to the prior study. Osteopenia. In Multilevel spondylosis in the spine with ankylosis at the L3-4 level and L4-5 level. Ankylosis of facet joints is noted at multiple levels with bridging osteophytes at nearly all visualized levels of the spine. IMPRESSION: 1. Acute fracture involving the right lateral aspect of the L3 vertebral body, superior endplate and osteophyte complex. Multilevel spondylosis and ankylosis as described. 2. No acute intra-abdominal or pelvic findings. 3. Vascular disease and coronary artery calcification. 4. A call is out to the referring provider further discuss findings in the above case. Aortic Atherosclerosis (ICD10-I70.0). Electronically  Signed: By: Zetta Bills M.D. On: 12/25/2019 16:42   DG Pelvis 1-2 Views  Result Date: 12/31/2019 CLINICAL DATA:  Pain. EXAM: PELVIS - 1-2 VIEW COMPARISON:  Pelvis and right hip x-rays dated December 10, 2019. FINDINGS: No acute fracture or dislocation. Mild osteoarthritis of both hip and sacroiliac joints, similar to prior study. Prior L5 kyphoplasty. Osteopenia. Soft tissues are unremarkable. Vascular calcifications. IMPRESSION: No acute osseous abnormality. Electronically Signed   By: Titus Dubin M.D.   On: 12/15/2019 15:03   DG Chest Port 1 View  Result Date: 12/03/2019 CLINICAL DATA:  Cough. EXAM: PORTABLE CHEST 1 VIEW COMPARISON:  May 26, 2014 FINDINGS: A new right central line terminates near the caval atrial junction. No pneumothorax. The lungs are clear. The cardiomediastinal silhouette is stable. No overt edema, nodule, mass, or infiltrate. IMPRESSION: 1. The new right central line terminates near the caval atrial junction. No pneumothorax. 2. No other acute abnormalities. Electronically Signed   By: Dorise Bullion III M.D   On: 12/01/2019 13:27    ROS: As per HPI . She is currently unable to give much history due acute illness, twitching, sweating  Physical Exam: Vitals:   12/12/2019 2357 12/21/19 0005 12/21/19 0522 12/21/19 0739  BP: 120/85  128/78 114/62  Pulse: 100  (!) 18 (!) 113  Resp: 20  20 18   Temp: 98.4 F (36.9 C)  98.5 F (36.9 C) 97.8 F (36.6 C)  TempSrc: Oral  Oral Oral  SpO2: 97%  99%   Weight:  55 kg    Height:         General: elderly lady acutely ill sweating profusely Head: NCAT sclera not icteric MMM Neck: Supple.  Lungs: CTA bilaterally without wheezes, rales, or rhonchi. Breathing is unlabored. Heart: tachy regular  Abdomen: soft NT + BS Lower extremities: without edema or ischemic changes, no open wounds  Neuro: myoclonic jerks, -not able to answer questions Dialysis Access: right IJ TDC exit site no drainage or erythema  Dialysis Orders:   MWF [place] 3.5 hours 2 K 2 Ca profile 4 EDW 51.5 heparin 2000 Mircera 150 q 2 weeks -  due 12/21 Hectorol 1 parsabiv 5 - no Fe  Assessment/Plan: Sepsis/ coag neg staph bacteremia - with poorly functional TDC - had been scheduled for outpatient repalcement - she is so acutely ill, she needs catheter out today and defer HD for now  Will give catheter holiday.  Antibiotics per primary ESRD -  MWF - labs ok - no acute need today Hypertension/volume  - Was hypotensive - given saline boluls - BP baseline - not to edw at dialysis and clear CXR on admission - EDW may need to go up slightly at d/c- meds on hold for now Anemia  - due for redose today - no Fe hgb [id number] Metabolic bone disease -  Continue Hectorol/binders - parsabiv not available here Nutrition - renal dietvit/nepro alb 1.9 L3 compression fx - symptom management Hypothyroidism  DNR  Myriam Jacobson, PA-C [facility]  I have seen and examined this patient and agree with the plan of care.  Patient was admitted with shaking rigors appears to be acutely ill suspect a bacterial catheter infection and will proceed with removal of dialysis catheter.  There appears to be no urgent indications for dialysis at this point.  We should continue to follow  Sherril Croon 12/21/2019, 2:20 PM  12/21/2019, 11:40 AM

## 2019-12-22 ENCOUNTER — Inpatient Hospital Stay (HOSPITAL_COMMUNITY): Payer: Medicare Other

## 2019-12-22 DIAGNOSIS — I330 Acute and subacute infective endocarditis: Secondary | ICD-10-CM

## 2019-12-22 DIAGNOSIS — I058 Other rheumatic mitral valve diseases: Secondary | ICD-10-CM

## 2019-12-22 DIAGNOSIS — D649 Anemia, unspecified: Secondary | ICD-10-CM

## 2019-12-22 DIAGNOSIS — R6520 Severe sepsis without septic shock: Secondary | ICD-10-CM

## 2019-12-22 DIAGNOSIS — A409 Streptococcal sepsis, unspecified: Secondary | ICD-10-CM

## 2019-12-22 DIAGNOSIS — I120 Hypertensive chronic kidney disease with stage 5 chronic kidney disease or end stage renal disease: Secondary | ICD-10-CM

## 2019-12-22 DIAGNOSIS — K219 Gastro-esophageal reflux disease without esophagitis: Secondary | ICD-10-CM

## 2019-12-22 DIAGNOSIS — I340 Nonrheumatic mitral (valve) insufficiency: Secondary | ICD-10-CM

## 2019-12-22 DIAGNOSIS — E1122 Type 2 diabetes mellitus with diabetic chronic kidney disease: Secondary | ICD-10-CM

## 2019-12-22 DIAGNOSIS — E785 Hyperlipidemia, unspecified: Secondary | ICD-10-CM

## 2019-12-22 DIAGNOSIS — J45909 Unspecified asthma, uncomplicated: Secondary | ICD-10-CM

## 2019-12-22 DIAGNOSIS — B957 Other staphylococcus as the cause of diseases classified elsewhere: Secondary | ICD-10-CM

## 2019-12-22 DIAGNOSIS — J449 Chronic obstructive pulmonary disease, unspecified: Secondary | ICD-10-CM

## 2019-12-22 DIAGNOSIS — Z888 Allergy status to other drugs, medicaments and biological substances status: Secondary | ICD-10-CM

## 2019-12-22 DIAGNOSIS — E1151 Type 2 diabetes mellitus with diabetic peripheral angiopathy without gangrene: Secondary | ICD-10-CM

## 2019-12-22 DIAGNOSIS — T827XXA Infection and inflammatory reaction due to other cardiac and vascular devices, implants and grafts, initial encounter: Secondary | ICD-10-CM

## 2019-12-22 DIAGNOSIS — R7881 Bacteremia: Secondary | ICD-10-CM

## 2019-12-22 DIAGNOSIS — I361 Nonrheumatic tricuspid (valve) insufficiency: Secondary | ICD-10-CM

## 2019-12-22 DIAGNOSIS — Z87891 Personal history of nicotine dependence: Secondary | ICD-10-CM

## 2019-12-22 DIAGNOSIS — Z992 Dependence on renal dialysis: Secondary | ICD-10-CM

## 2019-12-22 DIAGNOSIS — I059 Rheumatic mitral valve disease, unspecified: Secondary | ICD-10-CM

## 2019-12-22 DIAGNOSIS — N186 End stage renal disease: Secondary | ICD-10-CM

## 2019-12-22 LAB — CBC
HCT: 31.2 % — ABNORMAL LOW (ref 36.0–46.0)
Hemoglobin: 10.1 g/dL — ABNORMAL LOW (ref 12.0–15.0)
MCH: 28.1 pg (ref 26.0–34.0)
MCHC: 32.4 g/dL (ref 30.0–36.0)
MCV: 86.7 fL (ref 80.0–100.0)
Platelets: 129 10*3/uL — ABNORMAL LOW (ref 150–400)
RBC: 3.6 MIL/uL — ABNORMAL LOW (ref 3.87–5.11)
RDW: 17.2 % — ABNORMAL HIGH (ref 11.5–15.5)
WBC: 24.7 10*3/uL — ABNORMAL HIGH (ref 4.0–10.5)
nRBC: 0 % (ref 0.0–0.2)

## 2019-12-22 LAB — ECHOCARDIOGRAM COMPLETE
Height: 58 in
Weight: 1954.16 oz

## 2019-12-22 LAB — GLUCOSE, CAPILLARY
Glucose-Capillary: 178 mg/dL — ABNORMAL HIGH (ref 70–99)
Glucose-Capillary: 148 mg/dL — ABNORMAL HIGH (ref 70–99)
Glucose-Capillary: 146 mg/dL — ABNORMAL HIGH (ref 70–99)

## 2019-12-22 LAB — BASIC METABOLIC PANEL
Anion gap: 27 — ABNORMAL HIGH (ref 5–15)
BUN: 81 mg/dL — ABNORMAL HIGH (ref 8–23)
CO2: 15 mmol/L — ABNORMAL LOW (ref 22–32)
Calcium: 8.4 mg/dL — ABNORMAL LOW (ref 8.9–10.3)
Chloride: 87 mmol/L — ABNORMAL LOW (ref 98–111)
Creatinine, Ser: 10.54 mg/dL — ABNORMAL HIGH (ref 0.44–1.00)
GFR calc Af Amer: 3 mL/min — ABNORMAL LOW (ref >60)
GFR calc non Af Amer: 3 mL/min — ABNORMAL LOW (ref >60)
Glucose, Bld: 169 mg/dL — ABNORMAL HIGH (ref 70–99)
Potassium: 4.6 mmol/L (ref 3.5–5.1)
Sodium: 129 mmol/L — ABNORMAL LOW (ref 135–145)

## 2019-12-22 LAB — FOLATE RBC
Folate, Hemolysate: 576 ng/mL
Folate, RBC: 1907 ng/mL (ref >498)
Hematocrit: 30.2 % — ABNORMAL LOW (ref 34.0–46.6)

## 2019-12-22 LAB — MAGNESIUM: Magnesium: 2.3 mg/dL (ref 1.7–2.4)

## 2019-12-22 MED ORDER — PANTOPRAZOLE SODIUM 40 MG PO TBEC
40.0000 mg | DELAYED_RELEASE_TABLET | Freq: Every evening | ORAL | Status: DC | PRN
  Filled 2019-12-22: qty 2

## 2019-12-22 MED ORDER — CLONIDINE HCL 0.1 MG PO TABS
0.1000 mg | ORAL_TABLET | Freq: Every day | ORAL | Status: DC
  Administered 2019-12-22: 0.1 mg via ORAL
  Filled 2019-12-22: qty 1

## 2019-12-22 MED ORDER — NEPRO/CARBSTEADY PO LIQD: 237.0000 mL | Freq: Two times a day (BID) | ORAL | Status: DC

## 2019-12-22 MED ORDER — SODIUM CHLORIDE 0.9 % IV SOLN
INTRAVENOUS | Status: DC | PRN
  Administered 2019-12-22: 250 mL via INTRAVENOUS

## 2019-12-22 MED ORDER — FENTANYL CITRATE (PF) 100 MCG/2ML IJ SOLN
12.5000 ug | INTRAMUSCULAR | Status: DC | PRN
  Administered 2019-12-22 – 2019-12-24 (×5): 12.5 ug via INTRAVENOUS
  Filled 2019-12-22 (×5): qty 2

## 2019-12-22 MED ORDER — DARBEPOETIN ALFA 150 MCG/0.3ML IJ SOSY
150.0000 ug | PREFILLED_SYRINGE | INTRAMUSCULAR | Status: DC
  Administered 2019-12-23: 150 ug via INTRAVENOUS
  Filled 2019-12-22 (×2): qty 0.3

## 2019-12-22 MED ORDER — AMLODIPINE BESYLATE 2.5 MG PO TABS: 2.5000 mg | ORAL_TABLET | Freq: Every day | ORAL | Status: DC

## 2019-12-22 NOTE — Progress Notes (Signed)
  Echocardiogram 2D Echocardiogram has been performed.  Jarica Plass A Cherysh Epperly 12/22/2019, 2:24 PM

## 2019-12-22 NOTE — Progress Notes (Signed)
PROGRESS NOTE    [name]  [id number] DOB: 1934-02-04 DOA: 12/19/2019 PCP: Dorothyann Peng, NP     Brief Narrative:  [name] is a 85 y.o. female with medical history significant of  ESRD on HD MWF, PAD s/p bypass of left LE, hx of mesenteric ischemia, hypertension, COPD, Asthma, Type 2 diabetes who presents with concerns of dehydration. Patient has had decrease appetite for the past 2-3 days and daughter thought she was dehydrated and brought her to ED. She reportedly missed dialysis on Monday and Wednesday but had it on Friday. She was febrile with temperature up to 100.7 and hypotensive down to 66/26 with improved to 155/85 with 1L of LR. CT abdomen showed acute fracture involving the right lateral aspect of the L3 vertebral body, superior endplate and osteophyte complex. No acute intra-abdominal or pelvic findings. CXR showed no acute abnormalities. Negative pelvic and right hip X-ray.  Blood culture was positive for coag negative staph.  Patient has had some issues with her right IJ hemodialysis catheter.  After discussion with nephrology, decision was made to remove her dialysis catheter and give her a line holiday while treating for bacteremia.  New events last 24 hours / Subjective: No acute events.  She remains very tremorous and shaky on examination, which per report she has demonstrated since admission.  She is alert and oriented to self and place.  Denies any pain on examination.   Assessment & Plan:   Principal Problem:   Sepsis ([place]) Active Problems:   ESRD (end stage renal disease) ([place])   Hypotension   Increased anion gap metabolic acidosis   Acute kidney injury superimposed on CKD ([place])   Anemia due to end stage renal disease ([facility])   Hyponatremia   Closed L3 vertebral fracture ([facility])    Severe sepsis secondary to coag negative staph bacteremia -Blood culture 12/20 with staph lugdunensis and gram variable rod, pending final report  -Hemodialysis catheter  was removed on 12/21 to give line holiday -Repeat blood culture obtained 12/22 - pending  -RVP negative -COVID negative  -CT A/P: negative for intra-abd findings  -Vancomycin and cefepime -Hypotension is now resolved  ESRD on hemodialysis -Per nephrology. Hemodialysis catheter was removed on 12/21 to give line holiday  L3 compression fracture -Status post fall -CT A/P: Acute fracture involving the right lateral aspect of the L3 vertebral body, superior endplate and osteophyte complex. Multilevel spondylosis and ankylosis as described. -PT OT    Hypothyroidism -Continue Synthroid  Essential hypertension -Resume clonidine.  Continue to hold amlodipine due to hypotension on admission  Hyponatremia -Improving   DVT prophylaxis: Subcutaneous heparin Code Status: DNR Family Communication: Daughter updated over the phone today  Disposition Plan: Pending clinical improvement and return of repeat blood culture   Consultants:   Nephrology  IR  Procedures:   Removal of tunneled right IJ dialysis catheter 12/21  Antimicrobials:  Anti-infectives (From admission, onward)   Start     Dose/Rate Route Frequency Ordered Stop   12/21/19 1900  ceFEPIme (MAXIPIME) 1 g in sodium chloride 0.9 % 100 mL IVPB     1 g 200 mL/hr over 30 Minutes Intravenous Every 24 hours 12/21/2019 1818     12/21/19 1200  vancomycin (VANCOREADY) IVPB 500 mg/100 mL     500 mg 100 mL/hr over 60 Minutes Intravenous Every M-W-F (Hemodialysis) 12/21/19 1003     12/21/19 0000  vancomycin variable dose per unstable renal function (pharmacist dosing)  Status:  Discontinued  Does not apply See admin instructions 12/09/2019 1818 12/21/19 1003   12/26/2019 1830  vancomycin (VANCOREADY) IVPB 1250 mg/250 mL     1,250 mg 166.7 mL/hr over 90 Minutes Intravenous  Once 12/01/2019 1818 12/29/2019 2143   12/16/2019 1830  ceFEPIme (MAXIPIME) 2 g in sodium chloride 0.9 % 100 mL IVPB     2 g 200 mL/hr over 30 Minutes Intravenous   Once 12/13/2019 1818 12/11/2019 2314        Objective: Vitals:   12/22/19 0500 12/22/19 0542 12/22/19 0604 12/22/19 0925  BP:  (!) 126/94  (!) 127/51  Pulse:    (!) 132  Resp: (!) 26  20 20   Temp:    97.7 F (36.5 C)  TempSrc:    Oral  SpO2: 99%     Weight:      Height:        Intake/Output Summary (Last 24 hours) at 12/22/2019 1034 Last data filed at 12/22/2019 0450 Gross per 24 hour  Intake 240 ml  Output 0 ml  Net 240 ml   Filed Weights   12/07/2019 1210 12/21/19 0005 12/22/19 0450  Weight: 52.6 kg 55 kg 55.4 kg    Examination:  General exam: Appears calm,  shaky and tremorous  Respiratory system: Clear to auscultation. Respiratory effort normal. No respiratory distress.  Cardiovascular system: S1 & S2 heard, tachycardic, regular rhythm.  Gastrointestinal system: Abdomen is nondistended, soft and nontender. Normal bowel sounds heard. Central nervous system: Alert and oriented to self and place Extremities: Symmetric in appearance  Skin: No rashes, lesions or ulcers on exposed skin    Data Reviewed: I have personally reviewed following labs and imaging studies  CBC: Recent Labs  Lab 12/19/2019 1423 12/22/19 0732  WBC 20.6* 24.7*  NEUTROABS 17.6*  --   HGB 10.3* 10.1*  HCT 32.7* 31.2*  MCV 89.6 86.7  PLT 191 [id number]*   Basic Metabolic Panel: Recent Labs  Lab 12/03/2019 1340 12/21/19 0024 12/22/19 0732  NA 128* 127* 129*  K 4.0 4.0 4.6  CL 88* 86* 87*  CO2 21* 18* 15*  GLUCOSE 158* 130* 169*  BUN 57* 57* 81*  CREATININE 8.43* 8.38* 10.54*  CALCIUM 8.0* 7.6* 8.4*  MG  --   --  2.3   GFR: Estimated Creatinine Clearance: 3 mL/min (A) (by C-G formula based on SCr of 10.54 mg/dL (H)). Liver Function Tests: Recent Labs  Lab 12/04/2019 1340 12/21/19 0024  AST 28 23  ALT 13 10  ALKPHOS 90 78  BILITOT 0.4 0.8  PROT 7.4 6.2*  ALBUMIN 2.7* 1.9*   No results for input(s): LIPASE, AMYLASE in the last 168 hours. No results for input(s): AMMONIA in the last 168  hours. Coagulation Profile: No results for input(s): INR, PROTIME in the last 168 hours. Cardiac Enzymes: No results for input(s): CKTOTAL, CKMB, CKMBINDEX, TROPONINI in the last 168 hours. BNP (last 3 results) No results for input(s): PROBNP in the last 8760 hours. HbA1C: No results for input(s): HGBA1C in the last 72 hours. CBG: Recent Labs  Lab 12/21/19 0003 12/21/19 0612 12/21/19 2102 12/22/19 0601  GLUCAP 159* 154* 159* 178*   Lipid Profile: No results for input(s): CHOL, HDL, LDLCALC, TRIG, CHOLHDL, LDLDIRECT in the last 72 hours. Thyroid Function Tests: No results for input(s): TSH, T4TOTAL, FREET4, T3FREE, THYROIDAB in the last 72 hours. Anemia Panel: Recent Labs    12/21/19 0430  VITAMINB12 2,625*   Sepsis Labs: Recent Labs  Lab 12/21/19 0024  LATICACIDVEN 1.8  Recent Results (from the past 240 hour(s))  SARS CORONAVIRUS 2 (TAT 6-24 HRS) Nasopharyngeal Nasopharyngeal Swab     Status: None   Collection Time: 12/15/2019 12:17 PM   Specimen: Nasopharyngeal Swab  Result Value Ref Range Status   SARS Coronavirus 2 NEGATIVE NEGATIVE Final    Comment: (NOTE) SARS-CoV-2 target nucleic acids are NOT DETECTED. The SARS-CoV-2 RNA is generally detectable in upper and lower respiratory specimens during the acute phase of infection. Negative results do not preclude SARS-CoV-2 infection, do not rule out co-infections with other pathogens, and should not be used as the sole basis for treatment or other patient management decisions. Negative results must be combined with clinical observations, patient history, and epidemiological information. The expected result is Negative. Fact Sheet for Patients: SugarRoll.be Fact Sheet for Healthcare Providers: https://www.woods-mathews.com/ This test is not yet approved or cleared by the [place] FDA and  has been authorized for detection and/or diagnosis of SARS-CoV-2 by FDA under an  Emergency Use Authorization (EUA). This EUA will remain  in effect (meaning this test can be used) for the duration of the COVID-19 declaration under Section 56 4(b)(1) of the Act, 21 U.S.C. section 360bbb-3(b)(1), unless the authorization is terminated or revoked sooner. Performed at [facility], [place] [address] [place], [place] [address]   Blood culture (routine x 2)     Status: Abnormal (Preliminary result)   Collection Time: 12/05/2019  7:00 PM   Specimen: BLOOD RIGHT ARM  Result Value Ref Range Status   Specimen Description BLOOD RIGHT ARM  Final   Special Requests   Final    BOTTLES DRAWN AEROBIC AND ANAEROBIC Blood Culture results may not be optimal due to an inadequate volume of blood received in culture bottles   Culture  Setup Time (A)  Final    GRAM VARIABLE ROD AEROBIC BOTTLE ONLY CRITICAL RESULT CALLED TO, READ BACK BY AND VERIFIED WITH: Sharen Heck PharmD 10:10 12/21/19 ([email or web address]) GRAM POSITIVE COCCI ANAEROBIC BOTTLE ONLY CRITICAL RESULT CALLED TO, READ BACK BY AND VERIFIED WITH: PHARMD T BAUMEISTER [id number] MLM    Culture   Final    CULTURE REINCUBATED FOR BETTER GROWTH Performed at [facility], [place] [address] [place], [place] [address]    Report Status PENDING  Incomplete  Blood Culture ID Panel (Reflexed)     Status: Abnormal   Collection Time: 12/03/2019  7:00 PM  Result Value Ref Range Status   Enterococcus species NOT DETECTED NOT DETECTED Final   Listeria monocytogenes NOT DETECTED NOT DETECTED Final   Staphylococcus species DETECTED (A) NOT DETECTED Final    Comment: Methicillin (oxacillin) susceptible coagulase negative staphylococcus. Possible blood culture contaminant (unless isolated from more than one blood culture draw or clinical case suggests pathogenicity). No antibiotic treatment is indicated for blood  culture contaminants. CRITICAL RESULT CALLED TO, READ BACK BY AND VERIFIED WITH: Sharen Heck PharmD 10:10 12/21/19 ([email or web address])     Staphylococcus aureus (BCID) NOT DETECTED NOT DETECTED Final   Methicillin resistance NOT DETECTED NOT DETECTED Final   Streptococcus species NOT DETECTED NOT DETECTED Final   Streptococcus agalactiae NOT DETECTED NOT DETECTED Final   Streptococcus pneumoniae NOT DETECTED NOT DETECTED Final   Streptococcus pyogenes NOT DETECTED NOT DETECTED Final   Acinetobacter baumannii NOT DETECTED NOT DETECTED Final   Enterobacteriaceae species NOT DETECTED NOT DETECTED Final   Enterobacter cloacae complex NOT DETECTED NOT DETECTED Final   Escherichia coli NOT DETECTED NOT DETECTED Final   Klebsiella oxytoca NOT DETECTED  NOT DETECTED Final   Klebsiella pneumoniae NOT DETECTED NOT DETECTED Final   Proteus species NOT DETECTED NOT DETECTED Final   Serratia marcescens NOT DETECTED NOT DETECTED Final   Haemophilus influenzae NOT DETECTED NOT DETECTED Final   Neisseria meningitidis NOT DETECTED NOT DETECTED Final   Pseudomonas aeruginosa NOT DETECTED NOT DETECTED Final   Candida albicans NOT DETECTED NOT DETECTED Final   Candida glabrata NOT DETECTED NOT DETECTED Final   Candida krusei NOT DETECTED NOT DETECTED Final   Candida parapsilosis NOT DETECTED NOT DETECTED Final   Candida tropicalis NOT DETECTED NOT DETECTED Final    Comment: Performed at [facility], [place] [address], [place], [place] [address]  Respiratory Panel by PCR     Status: None   Collection Time: 12/10/2019 10:07 PM   Specimen: Nasopharyngeal Swab; Respiratory  Result Value Ref Range Status   Adenovirus NOT DETECTED NOT DETECTED Final   Coronavirus 229E NOT DETECTED NOT DETECTED Final    Comment: (NOTE) The Coronavirus on the Respiratory Panel, DOES NOT test for the novel  Coronavirus (2019 nCoV)    Coronavirus HKU1 NOT DETECTED NOT DETECTED Final   Coronavirus NL63 NOT DETECTED NOT DETECTED Final   Coronavirus OC43 NOT DETECTED NOT DETECTED Final   Metapneumovirus NOT DETECTED NOT DETECTED Final   Rhinovirus / Enterovirus  NOT DETECTED NOT DETECTED Final   Influenza A NOT DETECTED NOT DETECTED Final   Influenza B NOT DETECTED NOT DETECTED Final   Parainfluenza Virus 1 NOT DETECTED NOT DETECTED Final   Parainfluenza Virus 2 NOT DETECTED NOT DETECTED Final   Parainfluenza Virus 3 NOT DETECTED NOT DETECTED Final   Parainfluenza Virus 4 NOT DETECTED NOT DETECTED Final   Respiratory Syncytial Virus NOT DETECTED NOT DETECTED Final   Bordetella pertussis NOT DETECTED NOT DETECTED Final   Chlamydophila pneumoniae NOT DETECTED NOT DETECTED Final   Mycoplasma pneumoniae NOT DETECTED NOT DETECTED Final    Comment: Performed at [facility], [place]. [address], [place], [place] [address]  Blood culture (routine x 2)     Status: None (Preliminary result)   Collection Time: 12/21/19 12:24 AM   Specimen: BLOOD  Result Value Ref Range Status   Specimen Description BLOOD RIGHT HAND  Final   Special Requests   Final    BOTTLES DRAWN AEROBIC ONLY Blood Culture results may not be optimal due to an inadequate volume of blood received in culture bottles   Culture   Final    NO GROWTH 1 DAY Performed at [facility], [place] [address], [place], [place] [address]    Report Status PENDING  Incomplete      Radiology Studies: CT ABDOMEN PELVIS WO CONTRAST  Addendum Date: 12/02/2019   ADDENDUM REPORT: 12/19/2019 16:49 ADDENDUM: These results were called by telephone at the time of interpretation on 12/31/2019 at 4:48 pm to provider Dr. Tyrell Antonio, who verbally acknowledged these results. Electronically Signed   By: Zetta Bills M.D.   On: 12/03/2019 16:49   Result Date: 12/06/2019 CLINICAL DATA:  Abdominal pain, generalized acute. EXAM: CT ABDOMEN AND PELVIS WITHOUT CONTRAST TECHNIQUE: Multidetector CT imaging of the abdomen and pelvis was performed following the standard protocol without IV contrast. COMPARISON:  04/10/2017 FINDINGS: Lower chest: Basilar atelectasis. Coronary artery disease likely with prior  percutaneous coronary intervention. No pericardial effusion. Heart is incompletely imaged. Hepatobiliary: No signs of focal hepatic lesion. No pericholecystic stranding. No visible biliary ductal dilation on noncontrast imaging. Pancreas: Signs of pancreatic atrophy  slightly worse than on the prior study, no signs of ductal dilation. Spleen: Spleen is normal. Adrenals/Urinary Tract: Adrenal thickening bilaterally unchanged since 2018. Marked renal atrophy. Similar to prior study. Cyst in the upper pole the right kidney. No hydronephrosis. Urinary bladder is under distended limiting assessment. Stomach/Bowel: No signs of acute gastrointestinal process. Stomach is under distended limiting assessment. Scattered colonic diverticulosis no signs of diverticulitis. Appendix surgically absent by history. Vascular/Lymphatic: Extensive calcified atherosclerotic changes throughout the abdominal aorta. Retroperitoneal collateral pathways with similar appearance to prior study. No signs of adenopathy in the retroperitoneum or upper abdomen. No signs of pelvic lymphadenopathy. Reproductive: Post hysterectomy. Other: No signs of free air. Musculoskeletal: Extensive degenerative changes with signs of acute fracture involving the right lateral aspect of L3 vertebral body, superior endplate and osteophyte complex best seen on coronal image number 52 of series 6. Remote appearing fragmentation of the osteophyte on the left at this level Vertebroplasty changes at L2 are similar to the prior exam. Vertebroplasty changes at L5 with vertebra plana similar to the prior study. Osteopenia. In Multilevel spondylosis in the spine with ankylosis at the L3-4 level and L4-5 level. Ankylosis of facet joints is noted at multiple levels with bridging osteophytes at nearly all visualized levels of the spine. IMPRESSION: 1. Acute fracture involving the right lateral aspect of the L3 vertebral body, superior endplate and osteophyte complex. Multilevel  spondylosis and ankylosis as described. 2. No acute intra-abdominal or pelvic findings. 3. Vascular disease and coronary artery calcification. 4. A call is out to the referring provider further discuss findings in the above case. Aortic Atherosclerosis (ICD10-I70.0). Electronically Signed: By: Zetta Bills M.D. On: 12/30/2019 16:42   DG Pelvis 1-2 Views  Result Date: 12/19/2019 CLINICAL DATA:  Pain. EXAM: PELVIS - 1-2 VIEW COMPARISON:  Pelvis and right hip x-rays dated December 10, 2019. FINDINGS: No acute fracture or dislocation. Mild osteoarthritis of both hip and sacroiliac joints, similar to prior study. Prior L5 kyphoplasty. Osteopenia. Soft tissues are unremarkable. Vascular calcifications. IMPRESSION: No acute osseous abnormality. Electronically Signed   By: Titus Dubin M.D.   On: 12/06/2019 15:03   IR Removal Tun Cv Cath W/O FL  Result Date: 12/21/2019 INDICATION: End-stage renal disease. Bacteremia. Request removal of tunneled hemodialysis catheter. EXAM: REMOVAL OF TUNNELED RIGHT IJ HEMODIALYSIS CATHETER MEDICATIONS: None COMPLICATIONS: None immediate. PROCEDURE: Informed written consent was obtained from the patient following an explanation of the procedure, risks, benefits and alternatives to treatment. A time out was performed prior to the initiation of the procedure. Maximal barrier sterile technique was utilized including caps, mask, sterile gowns, sterile gloves, large sterile drape, hand hygiene, and chlorhexidine. 1% lidocaine was injected under sterile conditions along the subcutaneous tunnel. Utilizing a combination of blunt dissection and gentle traction, the cuff of the catheter was exposed and the catheter was removed intact. Hemostasis was obtained with manual compression. A dressing was placed. The patient tolerated the procedure well without immediate post procedural complication. IMPRESSION: Successful removal of tunneled right IJ dialysis catheter. Read by: Ascencion Dike  PA-C Electronically Signed   By: Jacqulynn Cadet M.D.   On: 12/21/2019 15:00   DG Chest Port 1 View  Result Date: 12/28/2019 CLINICAL DATA:  Cough. EXAM: PORTABLE CHEST 1 VIEW COMPARISON:  May 26, 2014 FINDINGS: A new right central line terminates near the caval atrial junction. No pneumothorax. The lungs are clear. The cardiomediastinal silhouette is stable. No overt edema, nodule, mass, or infiltrate. IMPRESSION: 1. The new right central line terminates  near the caval atrial junction. No pneumothorax. 2. No other acute abnormalities. Electronically Signed   By: Dorise Bullion III M.D   On: 12/06/2019 13:27      Scheduled Meds: . aspirin  81 mg Oral Daily  . Chlorhexidine Gluconate Cloth  6 each Topical Q0600  . cloNIDine  0.1 mg Oral Daily  . [START ON 12/23/2019] darbepoetin (ARANESP) injection - DIALYSIS  150 mcg Intravenous Q Wed-HD  . doxercalciferol  1 mcg Intravenous Q M,W,F-HD  . feeding supplement (NEPRO CARB STEADY)  237 mL Oral TID WC  . heparin  5,000 Units Subcutaneous Q8H  . levothyroxine  25 mcg Oral Q0600  . multivitamin  1 tablet Oral QHS  . sevelamer carbonate  800 mg Oral TID WC   Continuous Infusions: . ceFEPime (MAXIPIME) IV 1 g (12/21/19 1724)  . vancomycin       LOS: 2 days      Time spent: 35 minutes   Dessa Phi, DO [facility] 12/22/2019, 10:34 AM   Available via Epic secure chat 7am-7pm After these hours, please refer to coverage provider listed on [email or web address]

## 2019-12-22 NOTE — Progress Notes (Signed)
Patient had some episodes of hypotension upon admission and blood pressure control medications had been discontinued. Due to sustained ST, patients home medication for heart rate control will be resumed per MD. Frequent vitals have been initiated

## 2019-12-22 NOTE — Progress Notes (Signed)
[facility] Progress Note   Dialysis Orders: MWF [place] 3.5 hours 2 K 2 Ca profile 4 EDW 51.5 heparin 2000 Mircera 150 q 2 weeks - due 12/21 Hectorol 1 parsabiv 5 - no Fe  Assessment/Plan: 1. Sepsis/staph lugdunensis/gram variable rod  with poorly functional TDC - had been scheduled for outpatient repalcement - TDC removed 12/21 repeat BC 12/22   On MAxipime and Vanc 2. ESRD -  MWF -last HD 12/18 - TDC out 12/21 - follow labs/volume status daily - waiting for [facility] to clear - if negative Wed place TDC - , Labs creeping up. - would like neuro eval before sedating for [facility] placment 3. Hypertension/volume  - Was hypotensive - given saline boluls - BPnow  Baseline but very tachycardic today - not to edw at dialysis and clear CXR on admission - EDW may need to go up slightly at d/c-though poor intake at present -clonidine resumed  4. Anemia  - due for redose today - no Fe hgb 10.3 5. Metabolic bone disease -  Continue Hectorol/binders - parsabiv not available here 6. Nutrition - renal dietvit/nepro alb 1.9 7. L3 compression fx - symptom management 8. Hypothyroidism -repeat TSH 9. DNR  10. Tremulousness-agree with neuro consult -would re CT head given fall earlier this month   Myriam Jacobson, PA-C [place] [phone] 12/22/2019,12:13 PM  LOS: 2 days   Subjective:   Spoke with daughter at bedside; said Neuro was consulted- episodes of transient tremulousness at home as far back as 4 months ago, but nothing sustained like this.  Nowhere near baseline.  Objective Vitals:   12/22/19 0500 12/22/19 0542 12/22/19 0604 12/22/19 0925  BP:  (!) 126/94  (!) 127/51  Pulse:    (!) 132  Resp: (!) 26  20 20   Temp:    97.7 F (36.5 C)  TempSrc:    Oral  SpO2: 99%     Weight:      Height:       Physical Exam General: sitting up in bed NAD Heart: tachy reg rate ~120s Lungs: no rales, dim BS Abdomen: soft NT Extremities: no LE edema Dialysis Access:  none Neuro: constant tremulousness - though not a twitchy as yesterday - can give short 1-2 word answers   Additional Objective Labs: Basic Metabolic Panel: Recent Labs  Lab 12/23/2019 1340 12/21/19 0024 12/22/19 0732  NA 128* 127* 129*  K 4.0 4.0 4.6  CL 88* 86* 87*  CO2 21* 18* 15*  GLUCOSE 158* 130* 169*  BUN 57* 57* 81*  CREATININE 8.43* 8.38* 10.54*  CALCIUM 8.0* 7.6* 8.4*   Liver Function Tests: Recent Labs  Lab 12/06/2019 1340 12/21/19 0024  AST 28 23  ALT 13 10  ALKPHOS 90 78  BILITOT 0.4 0.8  PROT 7.4 6.2*  ALBUMIN 2.7* 1.9*   No results for input(s): LIPASE, AMYLASE in the last 168 hours. CBC: Recent Labs  Lab 12/27/2019 1423 12/22/19 0732  WBC 20.6* 24.7*  NEUTROABS 17.6*  --   HGB 10.3* 10.1*  HCT 32.7* 31.2*  MCV 89.6 86.7  PLT 191 129*   Blood Culture    Component Value Date/Time   SDES BLOOD RIGHT HAND 12/21/2019 0024   SPECREQUEST  12/21/2019 0024    BOTTLES DRAWN AEROBIC ONLY Blood Culture results may not be optimal due to an inadequate volume of blood received in culture bottles   CULT  12/21/2019 0024    NO GROWTH 1 DAY Performed at [facility]  Lab, [address] [place], [place] [address]    REPTSTATUS PENDING 12/21/2019 0024    Cardiac Enzymes: No results for input(s): CKTOTAL, CKMB, CKMBINDEX, TROPONINI in the last 168 hours. CBG: Recent Labs  Lab 12/21/19 0003 12/21/19 0612 12/21/19 2102 12/22/19 0601 12/22/19 1110  GLUCAP 159* 154* 159* 178* 148*   Iron Studies: No results for input(s): IRON, TIBC, TRANSFERRIN, FERRITIN in the last 72 hours. Lab Results  Component Value Date   INR 1.01 04/12/2017   INR 1.13 04/11/2017   INR 1.04 05/10/2015   Studies/Results: CT ABDOMEN PELVIS WO CONTRAST  Addendum Date: 12/26/2019   ADDENDUM REPORT: 12/03/2019 16:49 ADDENDUM: These results were called by telephone at the time of interpretation on 12/11/2019 at 4:48 pm to provider Dr. Tyrell Antonio, who verbally acknowledged these  results. Electronically Signed   By: Zetta Bills M.D.   On: 12/19/2019 16:49   Result Date: 12/27/2019 CLINICAL DATA:  Abdominal pain, generalized acute. EXAM: CT ABDOMEN AND PELVIS WITHOUT CONTRAST TECHNIQUE: Multidetector CT imaging of the abdomen and pelvis was performed following the standard protocol without IV contrast. COMPARISON:  04/10/2017 FINDINGS: Lower chest: Basilar atelectasis. Coronary artery disease likely with prior percutaneous coronary intervention. No pericardial effusion. Heart is incompletely imaged. Hepatobiliary: No signs of focal hepatic lesion. No pericholecystic stranding. No visible biliary ductal dilation on noncontrast imaging. Pancreas: Signs of pancreatic atrophy slightly worse than on the prior study, no signs of ductal dilation. Spleen: Spleen is normal. Adrenals/Urinary Tract: Adrenal thickening bilaterally unchanged since 2018. Marked renal atrophy. Similar to prior study. Cyst in the upper pole the right kidney. No hydronephrosis. Urinary bladder is under distended limiting assessment. Stomach/Bowel: No signs of acute gastrointestinal process. Stomach is under distended limiting assessment. Scattered colonic diverticulosis no signs of diverticulitis. Appendix surgically absent by history. Vascular/Lymphatic: Extensive calcified atherosclerotic changes throughout the abdominal aorta. Retroperitoneal collateral pathways with similar appearance to prior study. No signs of adenopathy in the retroperitoneum or upper abdomen. No signs of pelvic lymphadenopathy. Reproductive: Post hysterectomy. Other: No signs of free air. Musculoskeletal: Extensive degenerative changes with signs of acute fracture involving the right lateral aspect of L3 vertebral body, superior endplate and osteophyte complex best seen on coronal image number 52 of series 6. Remote appearing fragmentation of the osteophyte on the left at this level Vertebroplasty changes at L2 are similar to the prior exam.  Vertebroplasty changes at L5 with vertebra plana similar to the prior study. Osteopenia. In Multilevel spondylosis in the spine with ankylosis at the L3-4 level and L4-5 level. Ankylosis of facet joints is noted at multiple levels with bridging osteophytes at nearly all visualized levels of the spine. IMPRESSION: 1. Acute fracture involving the right lateral aspect of the L3 vertebral body, superior endplate and osteophyte complex. Multilevel spondylosis and ankylosis as described. 2. No acute intra-abdominal or pelvic findings. 3. Vascular disease and coronary artery calcification. 4. A call is out to the referring provider further discuss findings in the above case. Aortic Atherosclerosis (ICD10-I70.0). Electronically Signed: By: Zetta Bills M.D. On: 12/19/2019 16:42   DG Pelvis 1-2 Views  Result Date: 12/21/2019 CLINICAL DATA:  Pain. EXAM: PELVIS - 1-2 VIEW COMPARISON:  Pelvis and right hip x-rays dated December 10, 2019. FINDINGS: No acute fracture or dislocation. Mild osteoarthritis of both hip and sacroiliac joints, similar to prior study. Prior L5 kyphoplasty. Osteopenia. Soft tissues are unremarkable. Vascular calcifications. IMPRESSION: No acute osseous abnormality. Electronically Signed   By: Titus Dubin M.D.   On: 12/01/2019  15:03   IR Removal Tun Cv Cath W/O FL  Result Date: 12/21/2019 INDICATION: End-stage renal disease. Bacteremia. Request removal of tunneled hemodialysis catheter. EXAM: REMOVAL OF TUNNELED RIGHT IJ HEMODIALYSIS CATHETER MEDICATIONS: None COMPLICATIONS: None immediate. PROCEDURE: Informed written consent was obtained from the patient following an explanation of the procedure, risks, benefits and alternatives to treatment. A time out was performed prior to the initiation of the procedure. Maximal barrier sterile technique was utilized including caps, mask, sterile gowns, sterile gloves, large sterile drape, hand hygiene, and chlorhexidine. 1% lidocaine was injected  under sterile conditions along the subcutaneous tunnel. Utilizing a combination of blunt dissection and gentle traction, the cuff of the catheter was exposed and the catheter was removed intact. Hemostasis was obtained with manual compression. A dressing was placed. The patient tolerated the procedure well without immediate post procedural complication. IMPRESSION: Successful removal of tunneled right IJ dialysis catheter. Read by: Ascencion Dike PA-C Electronically Signed   By: Jacqulynn Cadet M.D.   On: 12/21/2019 15:00   DG Chest Port 1 View  Result Date: 12/10/2019 CLINICAL DATA:  Cough. EXAM: PORTABLE CHEST 1 VIEW COMPARISON:  May 26, 2014 FINDINGS: A new right central line terminates near the caval atrial junction. No pneumothorax. The lungs are clear. The cardiomediastinal silhouette is stable. No overt edema, nodule, mass, or infiltrate. IMPRESSION: 1. The new right central line terminates near the caval atrial junction. No pneumothorax. 2. No other acute abnormalities. Electronically Signed   By: Dorise Bullion III M.D   On: 12/03/2019 13:27   Medications: . ceFEPime (MAXIPIME) IV 1 g (12/21/19 1724)  . vancomycin     . aspirin  81 mg Oral Daily  . Chlorhexidine Gluconate Cloth  6 each Topical Q0600  . cloNIDine  0.1 mg Oral Daily  . [START ON 12/23/2019] darbepoetin (ARANESP) injection - DIALYSIS  150 mcg Intravenous Q Wed-HD  . doxercalciferol  1 mcg Intravenous Q M,W,F-HD  . feeding supplement (NEPRO CARB STEADY)  237 mL Oral TID WC  . heparin  5,000 Units Subcutaneous Q8H  . levothyroxine  25 mcg Oral Q0600  . multivitamin  1 tablet Oral QHS  . sevelamer carbonate  800 mg Oral TID WC

## 2019-12-22 NOTE — Progress Notes (Addendum)
Paged MD in concern for patients unchanged tremors and intolerance for swallowing at the bedside  MD return call @ 1815. Patient discussed and orders placed for patient

## 2019-12-22 NOTE — Progress Notes (Signed)
Initial Nutrition Assessment  DOCUMENTATION CODES:   Not applicable  INTERVENTION:   -Continue renal MVI daily -Magic cup TID with meals, each supplement provides 290 kcal and 9 grams of protein -Nepro Shake po BID, each supplement provides 425 kcal and 19 grams protein  NUTRITION DIAGNOSIS:   Inadequate oral intake related to decreased appetite as evidenced by meal completion < 25%.  GOAL:   Patient will meet greater than or equal to 90% of their needs  MONITOR:   PO intake, Supplement acceptance, Labs, Weight trends, Skin, I & O's  REASON FOR ASSESSMENT:   Low Braden    ASSESSMENT:   HPI: [name] is a 85 y.o. female with medical history significant of  ESRD on HD MWF, PAD s/p bypass of left LE, hx of mesenteric ischemia, hypertension, COPD, Asthma, Type 2 diabetes who presents with concerns of dehydration.  Pt admitted with severe sepsis secondary to staph bacteremia.   12/21- removal of HD cath  Reviewed I/O's: +480 ml x 24 hours and +3.5 L since admission  UOP: 0 ml x 24 hours  Pt receiving nursing care at time of visit.   Per IR notes, recommending line holiday for 48 hours.   Per nephrology notes, EDW 51.5. Per wt records, pt has been stable over the past several years.   Per H&P, pt with decreased appetite over the past 2-3 days PTA. Pt with minimal meal completion; PO: 10%.   Labs reviewed: Na: 129, K and Mg WDL. CBGS: 148-178.   Diet Order:   Diet Order            Diet renal with fluid restriction Fluid restriction: 1200 mL Fluid; Room service appropriate? Yes; Fluid consistency: Thin  Diet effective now              EDUCATION NEEDS:   No education needs have been identified at this time  Skin:  Skin Assessment: Reviewed RN Assessment  Last BM:  Unknown  Height:   Ht Readings from Last 1 Encounters:  12/04/2019 4\' 10"  (1.473 m)    Weight:   Wt Readings from Last 1 Encounters:  12/22/19 55.4 kg    Ideal Body Weight:  44.1  kg  BMI:  Body mass index is 25.53 kg/m.  Estimated Nutritional Needs:   Kcal:  1500-1700  Protein:  65-80 grams  Fluid:  1000 ml +UOP    Ravynn Hogate A. Jimmye Norman, RD, LDN, [place] Registered Dietitian II Certified Diabetes Care and Education Specialist Pager: [phone] After hours Pager: [phone]

## 2019-12-22 NOTE — Consult Note (Signed)
[facility] for Infectious Disease    Date of Admission:  12/06/2019     Total days of antibiotics 3 days  Vancomycin + Cefepime                Reason for Consult: Staph lugdinensis bacteremia     Referring Provider: Maylene Roes Primary Care Provider: Dorothyann Peng, NP   Assessment: BLIMI WINTER is a 85 y.o. female on Dialysis here with staphylococcus lugdinensis bacteremia; of note she also has a gram variable rod that has yet to be identified. High degree of concern for endocarditis related to the staph lugdinensis.  Her mecA gene was negative on BCID; will narrow her to vancomycin + cefazolin for now and follow further ID of GVR (although this may be more indicative of contaminant).   Her HD line has been removed; would recommend line holiday of 48 hours if possible to allow clearance of bacteremia as this organism is known to be a more aggressive coagulase negative staph. She will need TTE for endocarditis evaluation (ongoing now). Uncertain she would be suitable candidate for TEE if this is unrevealing.     Plan: 1. Vancomycin + Cefepime to continue  2. Follow micro data 3. Line holiday x 48h if possible  4. TTE report to follow for IE eval 5. Repeat BCx ordered   Principal Problem:   Sepsis ([place]) Active Problems:   ESRD (end stage renal disease) (HCC)   Hypotension   Increased anion gap metabolic acidosis   Acute kidney injury superimposed on CKD ([place])   Anemia due to end stage renal disease ([place])   Hyponatremia   Closed L3 vertebral fracture ([place])   . aspirin  81 mg Oral Daily  . Chlorhexidine Gluconate Cloth  6 each Topical Q0600  . cloNIDine  0.1 mg Oral Daily  . [START ON 12/23/2019] darbepoetin (ARANESP) injection - DIALYSIS  150 mcg Intravenous Q Wed-HD  . doxercalciferol  1 mcg Intravenous Q M,W,F-HD  . feeding supplement (NEPRO CARB STEADY)  237 mL Oral TID WC  . heparin  5,000 Units Subcutaneous Q8H  . levothyroxine  25 mcg Oral Q0600  .  multivitamin  1 tablet Oral QHS  . sevelamer carbonate  800 mg Oral TID WC    HPI: MADLYNN BROKER is a 85 y.o. female with ESRD on HD, chronic anemia, DM, COPD, Asthma, GERD, hyperlipidemia, htn, PAD s/p tibioperoneal bypass graft/endarderectomy 2013.   She presented from home (lives with granddaughter) with concern over being dehydrated. She missed dialysis 12/14 and 12/16 last week. She has had some issues with her dialysis port clotting from what I understand per chart review. She has not had any fevers or chills but did have decreased appetite and generalized weakness with fall recently.   In the ER she was found to have a fever to 100.7 F and hypotensive with SBP < 70 that improved with 1L of IVF. Leukocytosis to 20.6K. T abdomen revealed acute fracture of the lateral aspect of L3 vertebral body. COVID test negative.   Blood cultures drawn on 12/20 growing staph lugdenensis in 1/2 sites. BCID indicating no methicillin resistance. Repeat blood cultures were drawn today. HD line was removed. She has been on vancomycin and cefepime.   Review of Systems: Review of Systems  Unable to perform ROS: Mental acuity    Past Medical History:  Diagnosis Date  . Arthritis    "all over" (03/09/2014)  . Asthma    "  used to; not anymore" (03/09/2014)  . Blood transfusion 1960's  . Chronic anemia   . Chronic back pain   . ESRD (end stage renal disease) on dialysis ([place])    "M, W, . [address]  (04/10/2017)  . Gangrene ([place])    left fifth toe  . GERD (gastroesophageal reflux disease)    hx (03/09/2014)  . Gout, unspecified 1980's   "not anymore" (03/09/2014)  . Hyperlipidemia   . Hypertension   . Iliopsoas muscle hematoma 04/10/2017  . Mild pulmonary hypertension ([place])   . PAD (peripheral artery disease) ([place])    a. amputation of L toe 2013, with left external iliac artery to tibioperoneal trunk bypass graft with endarterectomy if the tibioperoneal trunk and anterior tibial artery origin in  October 2013.  Marland Kitchen Pneumonia    "once; years ago" (03/09/2014)  . Shoulder fracture, right 2012  . Type II diabetes mellitus ([place])    "not on any medication at this time" (03/09/2014)    Social History   Tobacco Use  . Smoking status: Former Smoker    Packs/day: 0.12    Years: 15.00    Pack years: 1.80    Types: Cigarettes  . Smokeless tobacco: Never Used  . Tobacco comment: 03/09/2014 "quit smoking cigarettes in the 1990's"  Substance Use Topics  . Alcohol use: No    Comment: hx of abuse stopped 1990's  . Drug use: No    Types: Marijuana    Comment: 03/09/2014 "tried marijuana in the 1990's; couldn't handle it"    Family History  Problem Relation Age of Onset  . Peripheral vascular disease Mother        amputation  . Hypertension Mother   . Alcohol abuse Mother   . Arthritis Mother   . COPD Sister   . Heart attack Sister    Allergies  Allergen Reactions  . Ace Inhibitors Other (See Comments)    Reaction unknown    OBJECTIVE: Blood pressure 117/60, pulse (!) 118, temperature 97.8 F (36.6 C), temperature source Oral, resp. rate 18, height 4\' 10"  (1.473 m), weight 55.4 kg, SpO2 97 %.  Physical Exam Vitals reviewed.  Constitutional:      Appearance: She is well-developed.     Comments: Resting in bed getting Echo. No distress.   HENT:     Mouth/Throat:     Mouth: No oral lesions.     Dentition: Normal dentition. No dental abscesses.     Pharynx: No oropharyngeal exudate.  Cardiovascular:     Rate and Rhythm: Normal rate and regular rhythm.     Heart sounds: Normal heart sounds.  Pulmonary:     Effort: Pulmonary effort is normal.     Breath sounds: Normal breath sounds.  Abdominal:     General: There is no distension.     Palpations: Abdomen is soft.     Tenderness: There is no abdominal tenderness.  Lymphadenopathy:     Cervical: No cervical adenopathy.  Skin:    General: Skin is warm and dry.     Capillary Refill: Capillary refill takes less than 2  seconds.     Findings: No rash.     Comments: No cutaneous stigmata of endocarditis   Neurological:     Mental Status: She is alert and oriented to person, place, and time.  Psychiatric:        Judgment: Judgment normal.     Comments: Does not offer much in response.      Lab Results Lab  Results  Component Value Date   WBC 24.7 (H) 12/22/2019   HGB 10.1 (L) 12/22/2019   HCT 31.2 (L) 12/22/2019   MCV 86.7 12/22/2019   PLT 129 (L) 12/22/2019    Lab Results  Component Value Date   CREATININE 10.54 (H) 12/22/2019   BUN 81 (H) 12/22/2019   NA 129 (L) 12/22/2019   K 4.6 12/22/2019   CL 87 (L) 12/22/2019   CO2 15 (L) 12/22/2019    Lab Results  Component Value Date   ALT 10 12/21/2019   AST 23 12/21/2019   ALKPHOS 78 12/21/2019   BILITOT 0.8 12/21/2019     Microbiology: Recent Results (from the past 240 hour(s))  SARS CORONAVIRUS 2 (TAT 6-24 HRS) Nasopharyngeal Nasopharyngeal Swab     Status: None   Collection Time: 12/07/2019 12:17 PM   Specimen: Nasopharyngeal Swab  Result Value Ref Range Status   SARS Coronavirus 2 NEGATIVE NEGATIVE Final    Comment: (NOTE) SARS-CoV-2 target nucleic acids are NOT DETECTED. The SARS-CoV-2 RNA is generally detectable in upper and lower respiratory specimens during the acute phase of infection. Negative results do not preclude SARS-CoV-2 infection, do not rule out co-infections with other pathogens, and should not be used as the sole basis for treatment or other patient management decisions. Negative results must be combined with clinical observations, patient history, and epidemiological information. The expected result is Negative. Fact Sheet for Patients: SugarRoll.be Fact Sheet for Healthcare Providers: https://www.woods-mathews.com/ This test is not yet approved or cleared by the [place] FDA and  has been authorized for detection and/or diagnosis of SARS-CoV-2 by FDA under an  Emergency Use Authorization (EUA). This EUA will remain  in effect (meaning this test can be used) for the duration of the COVID-19 declaration under Section 56 4(b)(1) of the Act, 21 U.S.C. section 360bbb-3(b)(1), unless the authorization is terminated or revoked sooner. Performed at [facility], [place] [name], [place], [place] [address]   Blood culture (routine x 2)     Status: Abnormal (Preliminary result)   Collection Time: 12/07/2019  7:00 PM   Specimen: BLOOD RIGHT ARM  Result Value Ref Range Status   Specimen Description BLOOD RIGHT ARM  Final   Special Requests   Final    BOTTLES DRAWN AEROBIC AND ANAEROBIC Blood Culture results may not be optimal due to an inadequate volume of blood received in culture bottles   Culture  Setup Time (A)  Final    GRAM VARIABLE ROD AEROBIC BOTTLE ONLY CRITICAL RESULT CALLED TO, READ BACK BY AND VERIFIED WITH: Sharen Heck PharmD 10:10 12/21/19 ([email or web address]) GRAM POSITIVE COCCI ANAEROBIC BOTTLE ONLY CRITICAL RESULT CALLED TO, READ BACK BY AND VERIFIED WITH: PHARMD T BAUMEISTER [id number] MLM    Culture   Final    CULTURE REINCUBATED FOR BETTER GROWTH STAPHYLOCOCCUS LUGDUNENSIS SUSCEPTIBILITIES TO FOLLOW Performed at [facility], [place] [address], [place], [place] [address]    Report Status PENDING  Incomplete  Blood Culture ID Panel (Reflexed)     Status: Abnormal   Collection Time: 12/21/2019  7:00 PM  Result Value Ref Range Status   Enterococcus species NOT DETECTED NOT DETECTED Final   Listeria monocytogenes NOT DETECTED NOT DETECTED Final   Staphylococcus species DETECTED (A) NOT DETECTED Final    Comment: Methicillin (oxacillin) susceptible coagulase negative staphylococcus. Possible blood culture contaminant (unless isolated from more than one blood culture draw or clinical case suggests pathogenicity). No antibiotic treatment is indicated for blood  culture  contaminants. CRITICAL RESULT CALLED TO, READ BACK BY AND VERIFIED  WITH: Sharen Heck PharmD 10:10 12/21/19 (wilsonm)    Staphylococcus aureus (BCID) NOT DETECTED NOT DETECTED Final   Methicillin resistance NOT DETECTED NOT DETECTED Final   Streptococcus species NOT DETECTED NOT DETECTED Final   Streptococcus agalactiae NOT DETECTED NOT DETECTED Final   Streptococcus pneumoniae NOT DETECTED NOT DETECTED Final   Streptococcus pyogenes NOT DETECTED NOT DETECTED Final   Acinetobacter baumannii NOT DETECTED NOT DETECTED Final   Enterobacteriaceae species NOT DETECTED NOT DETECTED Final   Enterobacter cloacae complex NOT DETECTED NOT DETECTED Final   Escherichia coli NOT DETECTED NOT DETECTED Final   Klebsiella oxytoca NOT DETECTED NOT DETECTED Final   Klebsiella pneumoniae NOT DETECTED NOT DETECTED Final   Proteus species NOT DETECTED NOT DETECTED Final   Serratia marcescens NOT DETECTED NOT DETECTED Final   Haemophilus influenzae NOT DETECTED NOT DETECTED Final   Neisseria meningitidis NOT DETECTED NOT DETECTED Final   Pseudomonas aeruginosa NOT DETECTED NOT DETECTED Final   Candida albicans NOT DETECTED NOT DETECTED Final   Candida glabrata NOT DETECTED NOT DETECTED Final   Candida krusei NOT DETECTED NOT DETECTED Final   Candida parapsilosis NOT DETECTED NOT DETECTED Final   Candida tropicalis NOT DETECTED NOT DETECTED Final    Comment: Performed at [facility], [address] [place], [place] [address]  Respiratory Panel by PCR     Status: None   Collection Time: 12/25/2019 10:07 PM   Specimen: Nasopharyngeal Swab; Respiratory  Result Value Ref Range Status   Adenovirus NOT DETECTED NOT DETECTED Final   Coronavirus 229E NOT DETECTED NOT DETECTED Final    Comment: (NOTE) The Coronavirus on the Respiratory Panel, DOES NOT test for the novel  Coronavirus (2019 nCoV)    Coronavirus HKU1 NOT DETECTED NOT DETECTED Final   Coronavirus NL63 NOT DETECTED NOT DETECTED Final   Coronavirus OC43 NOT DETECTED NOT DETECTED Final   Metapneumovirus NOT  DETECTED NOT DETECTED Final   Rhinovirus / Enterovirus NOT DETECTED NOT DETECTED Final   Influenza A NOT DETECTED NOT DETECTED Final   Influenza B NOT DETECTED NOT DETECTED Final   Parainfluenza Virus 1 NOT DETECTED NOT DETECTED Final   Parainfluenza Virus 2 NOT DETECTED NOT DETECTED Final   Parainfluenza Virus 3 NOT DETECTED NOT DETECTED Final   Parainfluenza Virus 4 NOT DETECTED NOT DETECTED Final   Respiratory Syncytial Virus NOT DETECTED NOT DETECTED Final   Bordetella pertussis NOT DETECTED NOT DETECTED Final   Chlamydophila pneumoniae NOT DETECTED NOT DETECTED Final   Mycoplasma pneumoniae NOT DETECTED NOT DETECTED Final    Comment: Performed at [facility], [place]. [address] [place], [place] [address]  Blood culture (routine x 2)     Status: None (Preliminary result)   Collection Time: 12/21/19 12:24 AM   Specimen: BLOOD  Result Value Ref Range Status   Specimen Description BLOOD RIGHT HAND  Final   Special Requests   Final    BOTTLES DRAWN AEROBIC ONLY Blood Culture results may not be optimal due to an inadequate volume of blood received in culture bottles   Culture   Final    NO GROWTH 1 DAY Performed at [facility], [place] [address], [place], [place] [address]    Report Status PENDING  Incomplete    Janene Madeira, MSN, NP-C [facility] for Infectious Disease [email or web address] Pager: [phone] Office: [phone] [place]: [phone]

## 2019-12-22 NOTE — Progress Notes (Signed)
Patient daughter Samuella Cota for an update, She would like to talk to attending MD at bedside if possible if not call. [phone]

## 2019-12-22 NOTE — Telephone Encounter (Signed)
Pt has been admitted to the hospital.  Nothing further needed. 

## 2019-12-23 ENCOUNTER — Inpatient Hospital Stay (HOSPITAL_COMMUNITY): Payer: Medicare Other

## 2019-12-23 DIAGNOSIS — R251 Tremor, unspecified: Secondary | ICD-10-CM

## 2019-12-23 DIAGNOSIS — I058 Other rheumatic mitral valve diseases: Secondary | ICD-10-CM

## 2019-12-23 HISTORY — PX: IR FLUORO GUIDE CV LINE RIGHT: IMG2283

## 2019-12-23 HISTORY — PX: IR US GUIDE VASC ACCESS RIGHT: IMG2390

## 2019-12-23 LAB — CULTURE, BLOOD (ROUTINE X 2)

## 2019-12-23 LAB — BASIC METABOLIC PANEL
Anion gap: 34 — ABNORMAL HIGH (ref 5–15)
BUN: 109 mg/dL — ABNORMAL HIGH (ref 8–23)
CO2: 9 mmol/L — ABNORMAL LOW (ref 22–32)
Calcium: 8.1 mg/dL — ABNORMAL LOW (ref 8.9–10.3)
Chloride: 86 mmol/L — ABNORMAL LOW (ref 98–111)
Creatinine, Ser: 12.11 mg/dL — ABNORMAL HIGH (ref 0.44–1.00)
GFR calc Af Amer: 3 mL/min — ABNORMAL LOW (ref >60)
GFR calc non Af Amer: 3 mL/min — ABNORMAL LOW (ref >60)
Glucose, Bld: 149 mg/dL — ABNORMAL HIGH (ref 70–99)
Potassium: 6.4 mmol/L (ref 3.5–5.1)
Sodium: 129 mmol/L — ABNORMAL LOW (ref 135–145)

## 2019-12-23 LAB — CBC
HCT: 33.3 % — ABNORMAL LOW (ref 36.0–46.0)
Hemoglobin: 10.8 g/dL — ABNORMAL LOW (ref 12.0–15.0)
MCH: 28.4 pg (ref 26.0–34.0)
MCHC: 32.4 g/dL (ref 30.0–36.0)
MCV: 87.6 fL (ref 80.0–100.0)
Platelets: 112 10*3/uL — ABNORMAL LOW (ref 150–400)
RBC: 3.8 MIL/uL — ABNORMAL LOW (ref 3.87–5.11)
RDW: 18.1 % — ABNORMAL HIGH (ref 11.5–15.5)
WBC: 28.5 10*3/uL — ABNORMAL HIGH (ref 4.0–10.5)
nRBC: 0.1 % (ref 0.0–0.2)

## 2019-12-23 LAB — GLUCOSE, CAPILLARY
Glucose-Capillary: 152 mg/dL — ABNORMAL HIGH (ref 70–99)
Glucose-Capillary: 167 mg/dL — ABNORMAL HIGH (ref 70–99)
Glucose-Capillary: 154 mg/dL — ABNORMAL HIGH (ref 70–99)
Glucose-Capillary: 207 mg/dL — ABNORMAL HIGH (ref 70–99)

## 2019-12-23 LAB — TSH: TSH: 3.381 u[IU]/mL (ref 0.350–4.500)

## 2019-12-23 MED ORDER — LIDOCAINE HCL 1 % IJ SOLN
INTRAMUSCULAR | Status: AC
  Filled 2019-12-23: qty 20

## 2019-12-23 MED ORDER — CHLORHEXIDINE GLUCONATE CLOTH 2 % EX PADS
6.0000 | MEDICATED_PAD | Freq: Every day | CUTANEOUS | Status: DC
  Administered 2019-12-23: 6 via TOPICAL

## 2019-12-23 MED ORDER — HEPARIN SODIUM (PORCINE) 1000 UNIT/ML IJ SOLN
INTRAMUSCULAR | Status: AC
  Administered 2019-12-23: 15 mL
  Filled 2019-12-23: qty 1

## 2019-12-23 MED ORDER — INSULIN ASPART 100 UNIT/ML ~~LOC~~ SOLN
10.0000 [IU] | Freq: Once | SUBCUTANEOUS | Status: AC
  Administered 2019-12-23: 10 [IU] via SUBCUTANEOUS

## 2019-12-23 MED ORDER — CHLORHEXIDINE GLUCONATE 4 % EX LIQD
CUTANEOUS | Status: AC
  Filled 2019-12-23: qty 15

## 2019-12-23 MED ORDER — HEPARIN SODIUM (PORCINE) 1000 UNIT/ML IJ SOLN
INTRAMUSCULAR | Status: AC
  Filled 2019-12-23: qty 1

## 2019-12-23 MED ORDER — HEPARIN SODIUM (PORCINE) 1000 UNIT/ML IJ SOLN
INTRAMUSCULAR | Status: AC
  Filled 2019-12-23: qty 4

## 2019-12-23 MED ORDER — VANCOMYCIN HCL IN DEXTROSE 500-5 MG/100ML-% IV SOLN
INTRAVENOUS | Status: AC
  Administered 2019-12-23: 500 mg
  Filled 2019-12-23: qty 100

## 2019-12-23 MED ORDER — HEPARIN SODIUM (PORCINE) 1000 UNIT/ML IJ SOLN
INTRAMUSCULAR | Status: DC | PRN
  Administered 2019-12-23: 3 mL via INTRAVENOUS
  Administered 2019-12-23: 1000 [IU] via INTRAVENOUS

## 2019-12-23 MED ORDER — DEXTROSE 50 % IV SOLN
1.0000 | Freq: Once | INTRAVENOUS | Status: AC
  Administered 2019-12-23: 50 mL via INTRAVENOUS
  Filled 2019-12-23: qty 50

## 2019-12-23 MED ORDER — DARBEPOETIN ALFA 150 MCG/0.3ML IJ SOSY
PREFILLED_SYRINGE | INTRAMUSCULAR | Status: AC
  Filled 2019-12-23: qty 0.3

## 2019-12-23 MED ORDER — IOHEXOL 300 MG/ML  SOLN
50.0000 mL | Freq: Once | INTRAMUSCULAR | Status: AC | PRN
  Administered 2019-12-23: 15 mL via INTRAVENOUS

## 2019-12-23 MED ORDER — DOXERCALCIFEROL 4 MCG/2ML IV SOLN
INTRAVENOUS | Status: AC
  Filled 2019-12-23: qty 2

## 2019-12-23 NOTE — Progress Notes (Signed)
Pharmacy Antibiotic Note  [name] is a 85 y.o. female admitted on 12/28/2019 with staph lugdunensis bacteremia and mitral valve endocarditis.  Pharmacy has been consulted for vancomycin dosing. Patient is ESRD with HD MWF. Patient has not had dialysis since her loading dose on 12/20 but may go to dialysis today or tomorrow after placement of temporary catheter.   Plan: Vancomycin 500 mg IV q HD Monitor access and dialysis sessions     Height: 4\' 10"  (147.3 cm) Weight: 122 lb 2.2 oz (55.4 kg) IBW/kg (Calculated) : 40.9  Temp (24hrs), Avg:97.5 F (36.4 C), Min:97.3 F (36.3 C), Max:97.8 F (36.6 C)  Recent Labs  Lab 12/12/2019 1340 12/15/2019 1423 12/21/19 0024 12/22/19 0732 12/23/19 0432 12/23/19 1235  WBC  --  20.6*  --  24.7* 28.5*  --   CREATININE 8.43*  --  8.38* 10.54*  --  12.11*  LATICACIDVEN  --   --  1.8  --   --   --     Estimated Creatinine Clearance: 2.6 mL/min (A) (by C-G formula based on SCr of 12.11 mg/dL (H)).    Allergies  Allergen Reactions  . Ace Inhibitors Other (See Comments)    Reaction unknown    Antimicrobials this admission: 12/20 vancomycin >>  12/20 cefepime >> 12/23     Jimmy Footman, PharmD, BCPS, BCIDP Infectious Diseases Clinical Pharmacist Phone: [phone] 12/23/2019 1:32 PM

## 2019-12-23 NOTE — Evaluation (Signed)
Clinical/Bedside Swallow Evaluation Patient Details  Name: [name] MRN: [id number] Date of Birth: [birth year]  Today's Date: 12/23/2019 Time: SLP Start Time (ACUTE ONLY): [id number] SLP Stop Time (ACUTE ONLY): 0948 SLP Time Calculation (min) (ACUTE ONLY): 26 min  Past Medical History:  Past Medical History:  Diagnosis Date  . Arthritis    "all over" (03/09/2014)  . Asthma    "used to; not anymore" (03/09/2014)  . Blood transfusion 1960's  . Chronic anemia   . Chronic back pain   . ESRD (end stage renal disease) on dialysis ([place])    "M, [address], [address]  (04/10/2017)  . Gangrene ([place])    left fifth toe  . GERD (gastroesophageal reflux disease)    hx (03/09/2014)  . Gout, unspecified 1980's   "not anymore" (03/09/2014)  . Hyperlipidemia   . Hypertension   . Iliopsoas muscle hematoma 04/10/2017  . Mild pulmonary hypertension ([place])   . PAD (peripheral artery disease) ([place])    a. amputation of L toe 2013, with left external iliac artery to tibioperoneal trunk bypass graft with endarterectomy if the tibioperoneal trunk and anterior tibial artery origin in October 2013.  Marland Kitchen Pneumonia    "once; years ago" (03/09/2014)  . Shoulder fracture, right 2012  . Type II diabetes mellitus ([place])    "not on any medication at this time" (03/09/2014)   Past Surgical History:  Past Surgical History:  Procedure Laterality Date  . APPENDECTOMY  1960's  . ARTERIOVENOUS GRAFT PLACEMENT Left    femoral loop arteriovenous Gore-Tex graft.  . AV FISTULA PLACEMENT  2008- 2013   "left upper arm; twice in my neck; left leg; removed from left leg; right upper arm" (11/25/2012)  . AV FISTULA PLACEMENT Right 03/09/2014   Procedure: RIGHT AXILLARY EXPLORATION; PARTIAL REMOVOAL OF OLD ARTERIOVENOUS (AV) GORE-TEX GRAFT; LIGATION OF RIGHT AXILLARY VEIN; ULTRASOUND GUIDED;  Surgeon: Conrad [place], MD;  Location: [facility];  Service: Vascular;  Laterality: Right;  . AV FISTULA REPAIR Bilateral    "right/left arm  fistula failed; removed left thigh d/t poor circulation" (11/25/2012)  . CATARACT EXTRACTION, BILATERAL Bilateral   . COLONOSCOPY  2014?  . ESOPHAGOGASTRODUODENOSCOPY N/A 09/10/2013   Procedure: ESOPHAGOGASTRODUODENOSCOPY (EGD);  Surgeon: [name], MD;  Location: [name];  Service: Endoscopy;  Laterality: N/A;  . EXCHANGE OF A DIALYSIS CATHETER Right 06/11/2013   Procedure: EXCHANGE OF A DIALYSIS CATHETER;  Surgeon: Conrad [place], MD;  Location: [facility];  Service: Vascular;  Laterality: Right;  . EYE SURGERY    . FEMORAL-POPLITEAL BYPASS GRAFT  04/11/2012   Procedure: BYPASS GRAFT FEMORAL-POPLITEAL ARTERY;  Surgeon: [name], MD;  Location: [facility];  Service: Vascular;  Laterality: Left;  Thrombectomy/Left femoral-popliteal bypass with revision of proximal end and shortening of graft; intraoperative arteriogram; endarterectomy and patch angioplasty with distal anastomosis  . FEMORAL-POPLITEAL BYPASS GRAFT  10/09/2012   Procedure: BYPASS GRAFT FEMORAL-POPLITEAL ARTERY;  Surgeon: [name], MD;  Location: [facility];  Service: Vascular;  Laterality: Left;  Redo left tibioperoneal trunk bypass with Gortex Graft 58mmx80cm.  Marland Kitchen FISTULOGRAM Right 06/11/2013   Procedure: Venogram with angioplasty;  Surgeon: Conrad [place], MD;  Location: [facility];  Service: Vascular;  Laterality: Right;  RIGHT CENTRAL VENOGRAM WITH ANGIOPLASTY  . FOOT AMPUTATION THROUGH METATARSAL Left 06/22/11   "whole 5th toe" (11/25/2012)  . INSERTION OF DIALYSIS CATHETER Right 03/10/2014   Procedure: INSERTION OF TUNNELED  DIALYSIS CATHETER -attempted  RIGHT SUBCLAVIAN, RIGHT FEMORAL TUNNELED DIALYSIS  CATHETER EXCHANGE with Inferior Vena Cava gram and Fibrin Sheath Angioplasty;  Surgeon: Conrad [place], MD;  Location: [facility];  Service: Vascular;  Laterality: Right;  . IR REMOVAL TUN CV CATH W/O FL  12/21/2019  . KYPHOPLASTY  11/25/2012   Procedure: KYPHOPLASTY;  Surgeon: Kristeen Miss, MD;  Location: [facility];  Service:  Neurosurgery;  Laterality: N/A;  Lumbar two lumbar five Kyphoplasty  . PR VEIN BYPASS GRAFT,AORTO-FEM-POP  06/14/2011  . TOTAL ABDOMINAL HYSTERECTOMY  1960's   one ovary removed   HPI:   85 y.o. female with medical history significant for  ESRD on HD MWF, PAD s/p bypass of left LE, hx of mesenteric ischemia, hypertension, COPD, Asthma, Type 2 diabetes, new closed L3 vertebral fracture who presents with concerns for dehydration. Dx sepsis secondary to staph bacteremia, mitral valve endocarditis with large vegetation.  Pt with significant tremors.    Assessment / Plan / Recommendation Clinical Impression  Pt alert, able to participate in limited clinical swallow assessment. Overall tremor noted to impact swallowing structures, as well, with visible jaw, tongue, and palpable laryngeal tremor.  These involuntary movements interfered with pt's ability to accept, manipulate, and safely swallow liquids and purees.  Pt unable to siphon water from straw or manage applesauce without material being expelled from mouth with every exhalation.  Pt answered basic yes/no questions intermittently, followed oral motor commands, but appeared exhausted.  Recommend continuing NPO status for now; allow ice chips after oral care.  SLP will follow for readiness to advance diet/safety.  SLP Visit Diagnosis: Dysphagia, unspecified (R13.10)    Aspiration Risk  Moderate aspiration risk    Diet Recommendation   npo except ice chips  Medication Administration: Via alternative means    Other  Recommendations Oral Care Recommendations: Oral care QID;Oral care prior to ice chip/H20   Follow up Recommendations Other (comment)(tba)      Frequency and Duration min 2x/week          Prognosis Prognosis for Safe Diet Advancement: Fair Barriers to Reach Goals: Cognitive deficits      Swallow Study   General HPI:  85 y.o. female with medical history significant for  ESRD on HD MWF, PAD s/p bypass of left LE, hx of  mesenteric ischemia, hypertension, COPD, Asthma, Type 2 diabetes, new closed L3 vertebral fracture who presents with concerns for dehydration. Dx sepsis secondary to staph bacteremia, mitral valve endocarditis with large vegetation.  Pt with significant tremors.  Type of Study: Bedside Swallow Evaluation Previous Swallow Assessment: no Diet Prior to this Study: NPO Temperature Spikes Noted: No Respiratory Status: Room air History of Recent Intubation: No Behavior/Cognition: Alert Oral Cavity Assessment: Within Functional Limits Oral Care Completed by SLP: No Oral Cavity - Dentition: Adequate natural dentition;Missing dentition Self-Feeding Abilities: Total assist Patient Positioning: Upright in bed Baseline Vocal Quality: Hoarse(glottal breaks) Volitional Swallow: Able to elicit    Oral/Motor/Sensory Function Overall Oral Motor/Sensory Function: Other (comment)(oral tremors)   Ice Chips Ice chips: Impaired Presentation: Spoon Oral Phase Impairments: Reduced lingual movement/coordination Oral Phase Functional Implications: Oral holding Pharyngeal Phase Impairments: Suspected delayed Swallow   Thin Liquid Thin Liquid: Impaired Presentation: Spoon;Straw Oral Phase Impairments: Reduced lingual movement/coordination Oral Phase Functional Implications: Oral holding Pharyngeal  Phase Impairments: Suspected delayed Swallow(palpable tremor larynx)    Nectar Thick Nectar Thick Liquid: Not tested   Honey Thick Honey Thick Liquid: Not tested   Puree Puree: Impaired Presentation: Spoon Oral Phase Impairments: Reduced lingual movement/coordination Oral Phase Functional Implications: Right anterior  spillage;Left anterior spillage;Oral holding(expelled puree with each exhalation) Pharyngeal Phase Impairments: Suspected delayed Swallow   Solid     Solid: Not tested      Juan Quam Laurice 12/23/2019,10:11 AM Estill Bamberg L. Tivis Ringer, [place] Office number  [phone] Pager [phone]

## 2019-12-23 NOTE — Progress Notes (Signed)
Patient is to remain NPO, as informed by speech. Ice chips okay. MD Avon Gully made aware that all oral meds were not given.

## 2019-12-23 NOTE — Progress Notes (Signed)
OT Cancellation Note  Patient Details Name: [name] MRN: [id number] DOB: 12-10-1934   Cancelled Treatment:    Reason Eval/Treat Not Completed: Medical issues which prohibited therapy, RN reports pt lethargic, following minimal commands with tremulous motions, and elevated potassium, hopeful for HD soon. Will hold today and follow up as able.  [place] Pager [phone] Office [phone]   [name] 12/23/2019, 1:45 PM

## 2019-12-23 NOTE — Progress Notes (Signed)
CT Head w/o contrast performed. Results called to this RN. MD paged concerning results. Will continue to monitor.

## 2019-12-23 NOTE — Progress Notes (Signed)
Pt's daughter called to check on her mother. Pt updated on the current plan of care. Pt's daughter plans to come to hospital today around [place] after she gets out of work.

## 2019-12-23 NOTE — Progress Notes (Signed)
1 amp of D50 and 10 units of novolog administered for critical potassium level.  Verbal order from MD Justin Mend.

## 2019-12-23 NOTE — Evaluation (Signed)
Physical Therapy Evaluation Patient Details Name: [name] MRN: [id number] DOB: [birth year] Today's Date: 12/23/2019   History of Present Illness  85 y.o. female with medical history significant of  ESRD on HD MWF, PAD s/p bypass of left LE, hx of mesenteric ischemia, hypertension, COPD, Asthma, Type 2 diabetes who presents with concerns of dehydration. Pt found to have staphylococcus lugdinensis bacteremia with concern for possible endocarditis.  Clinical Impression  Pt presents to PT with deficits in functional mobility, gait, balance, endurance, strength, power, communication, cognition, coordination, tone. Pt with significant resting tremor throughout entire body during session, minimally able to verbalize and communicating some with head nods. Pt responds to simple one step commands 50% of the time. Pt requires totalA for mobility currently and is limited by back pain. Pt will benefit from continued acute PT services to improve mobility and balance and reduce falls risk.    Follow Up Recommendations SNF;Supervision/Assistance - 24 hour    Equipment Recommendations  (defer to post-acute setting)    Recommendations for Other Services       Precautions / Restrictions Precautions Precautions: Fall Restrictions Weight Bearing Restrictions: No      Mobility  Bed Mobility Overal bed mobility: Needs Assistance Bed Mobility: Supine to Sit;Sit to Supine     Supine to sit: Total assist;HOB elevated Sit to supine: Total assist      Transfers                    Ambulation/Gait                Stairs            Wheelchair Mobility    Modified Rankin (Stroke Patients Only)       Balance Overall balance assessment: Needs assistance Sitting-balance support: Feet unsupported;No upper extremity supported Sitting balance-Leahy Scale: Zero Sitting balance - Comments: maxA sitting edge of bed Postural control: Posterior lean                                   Pertinent Vitals/Pain Pain Assessment: Faces Faces Pain Scale: Hurts whole lot Pain Location: back Pain Descriptors / Indicators: Aching;Grimacing;Guarding Pain Intervention(s): Limited activity within patient's tolerance;Monitored during session    Home Living Family/patient expects to be discharged to:: (unable to determine, pt minimally verbal)                      Prior Function Level of Independence: (unable to determine, pt minimally verbal)               Hand Dominance        Extremity/Trunk Assessment   Upper Extremity Assessment Upper Extremity Assessment: Generalized weakness    Lower Extremity Assessment Lower Extremity Assessment: Generalized weakness;RLE deficits/detail;LLE deficits/detail RLE Deficits / Details: increased adductor tone, able to wiggle toes but no other AROM noted LLE Deficits / Details: increased adductor and knee extensor tone, able to wiggle toes on command but no other AROM noted    Cervical / Trunk Assessment Cervical / Trunk Assessment: Kyphotic  Communication   Communication: Expressive difficulties(pt minimally verbalizing during session)  Cognition Arousal/Alertness: Lethargic Behavior During Therapy: Restless Overall Cognitive Status: No family/caregiver present to determine baseline cognitive functioning  General Comments: pt minimally verbally responsive during session, able to state first name and birth month and year, otherwise answering inconsistently with head nods      General Comments General comments (skin integrity, edema, etc.): Pt limited by back pain, reporting back pain immediately upon sitting. Pt with full body tremors noted at rest during session.    Exercises     Assessment/Plan    PT Assessment Patient needs continued PT services  PT Problem List Decreased strength;Decreased activity tolerance;Decreased range of  motion;Decreased balance;Decreased mobility;Decreased coordination;Decreased cognition;Decreased knowledge of use of DME;Decreased safety awareness;Decreased knowledge of precautions;Pain;Impaired tone       PT Treatment Interventions DME instruction;Gait training;Stair training;Functional mobility training;Therapeutic activities;Therapeutic exercise;Balance training;Neuromuscular re-education;Cognitive remediation;Patient/family education    PT Goals (Current goals can be found in the Care Plan section)  Acute Rehab PT Goals Patient Stated Goal: Pt unable to state PT Goal Formulation: Patient unable to participate in goal setting Time For Goal Achievement: 01/06/20 Potential to Achieve Goals: Fair    Frequency Min 2X/week   Barriers to discharge        Co-evaluation               AM-PAC PT "6 Clicks" Mobility  Outcome Measure Help needed turning from your back to your side while in a flat bed without using bedrails?: Total Help needed moving from lying on your back to sitting on the side of a flat bed without using bedrails?: Total Help needed moving to and from a bed to a chair (including a wheelchair)?: Total Help needed standing up from a chair using your arms (e.g., wheelchair or bedside chair)?: Total Help needed to walk in hospital room?: Total Help needed climbing 3-5 steps with a railing? : Total 6 Click Score: 6    End of Session Equipment Utilized During Treatment: (none) Activity Tolerance: Patient limited by pain Patient left: in bed;with bed alarm set;with call bell/phone within reach Nurse Communication: Mobility status PT Visit Diagnosis: Muscle weakness (generalized) (M62.81);Other symptoms and signs involving the nervous system (R29.898)    Time: [id number] PT Time Calculation (min) (ACUTE ONLY): 15 min   Charges:   PT Evaluation $PT Eval High Complexity: 1 High          Zenaida Niece, PT, DPT Acute Rehabilitation Pager: [phone]   Zenaida Niece 12/23/2019, 12:08 PM

## 2019-12-23 NOTE — Progress Notes (Signed)
[facility] ROUNDING NOTE   Subjective:   This is an 85 year old lady with a history of end-stage renal disease Monday Wednesday Friday dialysis.  She also has peripheral artery disease status post bypass graft of her left lower extremity.  She has a history of mesenteric ischemia hypertension COPD asthma type 2 diabetes.  She is brought in with decreased appetite and altered mental status temperature of 100.7 and hypotensive.  CT of the abdomen showed acute fracture involving the right lateral aspect of the L3 vertebral body.    Blood cultures were positive for staph lugdunensis which is highly destructive to the heart valves.  TEE revealed mitral valve endocarditis with large vegetation.  Her dialysis catheter was removed 12/21/2019.  Does not appear that she has had dialysis since 12/19/2019.  We will proceed with placement of temporary dialysis catheter 12/23/2019  No labs this morning pending WBC 28.5 hemoglobin 10.8 platelets 112  Blood pressure 115/61 pulse 87 temperature 97.8 O2 sats 96% room air  Aspirin 81 mg daily, darbepoetin 150 mcg every Wednesday, Hectorol 1 mcg Monday Wednesday Friday, levothyroxine 25 mcg daily, Renvela 800 mg with meals,  IV cefepime 1 g every 24 hours, vancomycin 500 mg Monday Wednesday Friday     Objective:  Vital signs in last 24 hours:  Temp:  [97.3 F (36.3 C)-97.8 F (36.6 C)] 97.8 F (36.6 C) (12/23 1115) Pulse Rate:  [118] 118 (12/22 1248) Resp:  [16-26] 21 (12/23 1115) BP: (108-134)/(48-79) 115/61 (12/23 1115) SpO2:  [95 %-100 %] 96 % (12/23 1115)  Weight change:  Filed Weights   12/25/2019 1210 12/21/19 0005 12/22/19 0450  Weight: 52.6 kg 55 kg 55.4 kg    Intake/Output: I/O last 3 completed shifts: In: 566.3 [P.O.:360; I.V.:6.3; IV Piggyback:200] Out: 0    Intake/Output this shift:  No intake/output data recorded.  General: sitting up in bed NAD Heart: tachy reg rate ~120s Lungs: no rales, dim BS Abdomen: soft  NT Extremities: no LE edema Dialysis Access: none Neuro: constant tremulousness - though not a twitchy as yesterday - can give short 1-2 word answers   Basic Metabolic Panel: Recent Labs  Lab 12/25/2019 1340 12/21/19 0024 12/22/19 0732  NA 128* 127* 129*  K 4.0 4.0 4.6  CL 88* 86* 87*  CO2 21* 18* 15*  GLUCOSE 158* 130* 169*  BUN 57* 57* 81*  CREATININE 8.43* 8.38* 10.54*  CALCIUM 8.0* 7.6* 8.4*  MG  --   --  2.3    Liver Function Tests: Recent Labs  Lab 12/04/2019 1340 12/21/19 0024  AST 28 23  ALT 13 10  ALKPHOS 90 78  BILITOT 0.4 0.8  PROT 7.4 6.2*  ALBUMIN 2.7* 1.9*   No results for input(s): LIPASE, AMYLASE in the last 168 hours. No results for input(s): AMMONIA in the last 168 hours.  CBC: Recent Labs  Lab 12/30/2019 1423 12/21/19 0430 12/22/19 0732 12/23/19 0432  WBC 20.6*  --  24.7* 28.5*  NEUTROABS 17.6*  --   --   --   HGB 10.3*  --  10.1* 10.8*  HCT 32.7* 30.2* 31.2* 33.3*  MCV 89.6  --  86.7 87.6  PLT 191  --  129* 112*    Cardiac Enzymes: No results for input(s): CKTOTAL, CKMB, CKMBINDEX, TROPONINI in the last 168 hours.  BNP: Invalid input(s): POCBNP  CBG: Recent Labs  Lab 12/22/19 0601 12/22/19 1110 12/22/19 1648 12/22/19 2105 12/23/19 0608  GLUCAP 178* 148* 152* 146* 167*    Microbiology:  Results for orders placed or performed during the hospital encounter of 12/10/2019  SARS CORONAVIRUS 2 (TAT 6-24 HRS) Nasopharyngeal Nasopharyngeal Swab     Status: None   Collection Time: 12/28/2019 12:17 PM   Specimen: Nasopharyngeal Swab  Result Value Ref Range Status   SARS Coronavirus 2 NEGATIVE NEGATIVE Final    Comment: (NOTE) SARS-CoV-2 target nucleic acids are NOT DETECTED. The SARS-CoV-2 RNA is generally detectable in upper and lower respiratory specimens during the acute phase of infection. Negative results do not preclude SARS-CoV-2 infection, do not rule out co-infections with other pathogens, and should not be used as the sole  basis for treatment or other patient management decisions. Negative results must be combined with clinical observations, patient history, and epidemiological information. The expected result is Negative. Fact Sheet for Patients: SugarRoll.be Fact Sheet for Healthcare Providers: https://www.woods-mathews.com/ This test is not yet approved or cleared by the [place] FDA and  has been authorized for detection and/or diagnosis of SARS-CoV-2 by FDA under an Emergency Use Authorization (EUA). This EUA will remain  in effect (meaning this test can be used) for the duration of the COVID-19 declaration under Section 56 4(b)(1) of the Act, 21 U.S.C. section 360bbb-3(b)(1), unless the authorization is terminated or revoked sooner. Performed at [facility], [place] [address] [place], [place] [address]   Blood culture (routine x 2)     Status: Abnormal   Collection Time: 12/07/2019  7:00 PM   Specimen: BLOOD RIGHT ARM  Result Value Ref Range Status   Specimen Description BLOOD RIGHT ARM  Final   Special Requests   Final    BOTTLES DRAWN AEROBIC AND ANAEROBIC Blood Culture results may not be optimal due to an inadequate volume of blood received in culture bottles   Culture  Setup Time (A)  Final    GRAM VARIABLE ROD AEROBIC BOTTLE ONLY CRITICAL RESULT CALLED TO, READ BACK BY AND VERIFIED WITH: Sharen Heck PharmD 10:10 12/21/19 ([email or web address]) GRAM POSITIVE COCCI ANAEROBIC BOTTLE ONLY CRITICAL RESULT CALLED TO, READ BACK BY AND VERIFIED WITH: Milford Cage [id number] Performed at [facility], [place] [address] [place], [place] [address]    Culture BACILLUS SPECIES STAPHYLOCOCCUS LUGDUNENSIS  (A)  Final   Report Status 12/23/2019 FINAL  Final   Organism ID, Bacteria STAPHYLOCOCCUS LUGDUNENSIS  Final      Susceptibility   Staphylococcus lugdunensis - MIC*    CIPROFLOXACIN <=0.5 SENSITIVE Sensitive     ERYTHROMYCIN >=8 RESISTANT  Resistant     GENTAMICIN <=0.5 SENSITIVE Sensitive     OXACILLIN 0.5 SENSITIVE Sensitive     TETRACYCLINE <=1 SENSITIVE Sensitive     VANCOMYCIN 1 SENSITIVE Sensitive     TRIMETH/SULFA <=10 SENSITIVE Sensitive     CLINDAMYCIN >=8 RESISTANT Resistant     RIFAMPIN <=0.5 SENSITIVE Sensitive     Inducible Clindamycin NEGATIVE Sensitive     * STAPHYLOCOCCUS LUGDUNENSIS  Blood Culture ID Panel (Reflexed)     Status: Abnormal   Collection Time: 12/11/2019  7:00 PM  Result Value Ref Range Status   Enterococcus species NOT DETECTED NOT DETECTED Final   Listeria monocytogenes NOT DETECTED NOT DETECTED Final   Staphylococcus species DETECTED (A) NOT DETECTED Final    Comment: Methicillin (oxacillin) susceptible coagulase negative staphylococcus. Possible blood culture contaminant (unless isolated from more than one blood culture draw or clinical case suggests pathogenicity). No antibiotic treatment is indicated for blood  culture contaminants. CRITICAL RESULT CALLED TO, READ BACK BY AND  VERIFIED WITH: Sharen Heck PharmD 10:10 12/21/19 ([email or web address])    Staphylococcus aureus (BCID) NOT DETECTED NOT DETECTED Final   Methicillin resistance NOT DETECTED NOT DETECTED Final   Streptococcus species NOT DETECTED NOT DETECTED Final   Streptococcus agalactiae NOT DETECTED NOT DETECTED Final   Streptococcus pneumoniae NOT DETECTED NOT DETECTED Final   Streptococcus pyogenes NOT DETECTED NOT DETECTED Final   Acinetobacter baumannii NOT DETECTED NOT DETECTED Final   Enterobacteriaceae species NOT DETECTED NOT DETECTED Final   Enterobacter cloacae complex NOT DETECTED NOT DETECTED Final   Escherichia coli NOT DETECTED NOT DETECTED Final   Klebsiella oxytoca NOT DETECTED NOT DETECTED Final   Klebsiella pneumoniae NOT DETECTED NOT DETECTED Final   Proteus species NOT DETECTED NOT DETECTED Final   Serratia marcescens NOT DETECTED NOT DETECTED Final   Haemophilus influenzae NOT DETECTED NOT DETECTED Final    Neisseria meningitidis NOT DETECTED NOT DETECTED Final   Pseudomonas aeruginosa NOT DETECTED NOT DETECTED Final   Candida albicans NOT DETECTED NOT DETECTED Final   Candida glabrata NOT DETECTED NOT DETECTED Final   Candida krusei NOT DETECTED NOT DETECTED Final   Candida parapsilosis NOT DETECTED NOT DETECTED Final   Candida tropicalis NOT DETECTED NOT DETECTED Final    Comment: Performed at [facility], [address] [place], [place] [address]  Respiratory Panel by PCR     Status: None   Collection Time: 12/02/2019 10:07 PM   Specimen: Nasopharyngeal Swab; Respiratory  Result Value Ref Range Status   Adenovirus NOT DETECTED NOT DETECTED Final   Coronavirus 229E NOT DETECTED NOT DETECTED Final    Comment: (NOTE) The Coronavirus on the Respiratory Panel, DOES NOT test for the novel  Coronavirus (2019 nCoV)    Coronavirus HKU1 NOT DETECTED NOT DETECTED Final   Coronavirus NL63 NOT DETECTED NOT DETECTED Final   Coronavirus OC43 NOT DETECTED NOT DETECTED Final   Metapneumovirus NOT DETECTED NOT DETECTED Final   Rhinovirus / Enterovirus NOT DETECTED NOT DETECTED Final   Influenza A NOT DETECTED NOT DETECTED Final   Influenza B NOT DETECTED NOT DETECTED Final   Parainfluenza Virus 1 NOT DETECTED NOT DETECTED Final   Parainfluenza Virus 2 NOT DETECTED NOT DETECTED Final   Parainfluenza Virus 3 NOT DETECTED NOT DETECTED Final   Parainfluenza Virus 4 NOT DETECTED NOT DETECTED Final   Respiratory Syncytial Virus NOT DETECTED NOT DETECTED Final   Bordetella pertussis NOT DETECTED NOT DETECTED Final   Chlamydophila pneumoniae NOT DETECTED NOT DETECTED Final   Mycoplasma pneumoniae NOT DETECTED NOT DETECTED Final    Comment: Performed at [facility], [place]. [address], [place], [place] [address]  Blood culture (routine x 2)     Status: None (Preliminary result)   Collection Time: 12/21/19 12:24 AM   Specimen: BLOOD  Result Value Ref Range Status   Specimen Description BLOOD RIGHT  HAND  Final   Special Requests   Final    BOTTLES DRAWN AEROBIC ONLY Blood Culture results may not be optimal due to an inadequate volume of blood received in culture bottles   Culture  Setup Time   Final    AEROBIC BOTTLE ONLY GRAM POSITIVE COCCI CRITICAL VALUE NOTED.  VALUE IS CONSISTENT WITH PREVIOUSLY REPORTED AND CALLED VALUE.    Culture   Final    TOO YOUNG TO READ Performed at [facility], [place] [address], [place], [place] [address]    Report Status PENDING  Incomplete  Culture, blood (routine x 2)  Status: None (Preliminary result)   Collection Time: 12/22/19  7:32 AM   Specimen: BLOOD RIGHT HAND  Result Value Ref Range Status   Specimen Description BLOOD RIGHT HAND  Final   Special Requests   Final    BOTTLES DRAWN AEROBIC ONLY Blood Culture results may not be optimal due to an inadequate volume of blood received in culture bottles   Culture   Final    NO GROWTH 1 DAY Performed at [facility], [place] [address] [place], [place] [address]    Report Status PENDING  Incomplete  Culture, blood (routine x 2)     Status: None (Preliminary result)   Collection Time: 12/22/19  7:50 AM   Specimen: BLOOD LEFT HAND  Result Value Ref Range Status   Specimen Description BLOOD LEFT HAND  Final   Special Requests   Final    BOTTLES DRAWN AEROBIC ONLY Blood Culture results may not be optimal due to an inadequate volume of blood received in culture bottles   Culture   Final    NO GROWTH 1 DAY Performed at [facility], [place] [address], [place], [place] [address]    Report Status PENDING  Incomplete    Coagulation Studies: No results for input(s): LABPROT, INR in the last 72 hours.  Urinalysis: No results for input(s): COLORURINE, LABSPEC, PHURINE, GLUCOSEU, HGBUR, BILIRUBINUR, KETONESUR, PROTEINUR, UROBILINOGEN, NITRITE, LEUKOCYTESUR in the last 72 hours.  Invalid input(s): APPERANCEUR    Imaging: CT HEAD WO CONTRAST  Result Date: 12/23/2019 CLINICAL  DATA:  Fall earlier this month, now with tremors EXAM: CT HEAD WITHOUT CONTRAST TECHNIQUE: Contiguous axial images were obtained from the base of the skull through the vertex without intravenous contrast. COMPARISON:  CT head 10/10/2019 FINDINGS: Brain: There is a new punctate focus of calcification along the cortex of the anterior right insula, not present on comparison imaging is recent is 12/10/2019. The presence of this finding is worrisome for a possible distal branch calcified thrombus. However, there is no CT evidence of acute infarction. No acute intracranial hemorrhage, hydrocephalus, extra-axial collection or mass lesion/mass effect is seen either. Symmetric prominence of the ventricles, cisterns and sulci compatible with parenchymal volume loss. Patchy areas of white matter hypoattenuation are most compatible with chronic microvascular angiopathy. Vascular: Punctate calcification along the right anterior insular, possibly within the vascular branch. No other hyperdense vessels. Atherosclerotic calcification of the carotid siphons and intradural vertebral arteries. Skull: No calvarial fracture or suspicious osseous lesion. No scalp swelling or hematoma. Sinuses/Orbits: A apical and paranasal sinuses and mastoid air cells are predominantly clear. Orbital structures are unremarkable aside from prior lens extractions. Other: None IMPRESSION: New punctate calcification along the cortex of the right insula, new since comparison. Appearance is nonspecific, could feasibly reflect some calcified intravascular thrombus versus dural calcification developing in the interim. No acute CT evidence of infarct is seen. If there is persisting concern for ischemia, MRI is more sensitive and specific. These results will be called to the ordering clinician or representative by the Radiologist Assistant, and communication documented in the PACS or zVision Dashboard. Electronically Signed   By: Lovena Le M.D.   On:  12/23/2019 04:11   IR Removal Tun Cv Cath W/O FL  Result Date: 12/21/2019 INDICATION: End-stage renal disease. Bacteremia. Request removal of tunneled hemodialysis catheter. EXAM: REMOVAL OF TUNNELED RIGHT IJ HEMODIALYSIS CATHETER MEDICATIONS: None COMPLICATIONS: None immediate. PROCEDURE: Informed written consent was obtained from the patient following an explanation of the procedure, risks, benefits  and alternatives to treatment. A time out was performed prior to the initiation of the procedure. Maximal barrier sterile technique was utilized including caps, mask, sterile gowns, sterile gloves, large sterile drape, hand hygiene, and chlorhexidine. 1% lidocaine was injected under sterile conditions along the subcutaneous tunnel. Utilizing a combination of blunt dissection and gentle traction, the cuff of the catheter was exposed and the catheter was removed intact. Hemostasis was obtained with manual compression. A dressing was placed. The patient tolerated the procedure well without immediate post procedural complication. IMPRESSION: Successful removal of tunneled right IJ dialysis catheter. Read by: Ascencion Dike PA-C Electronically Signed   By: Jacqulynn Cadet M.D.   On: 12/21/2019 15:00   ECHOCARDIOGRAM COMPLETE  Result Date: 12/22/2019   ECHOCARDIOGRAM REPORT   Patient Name:   [name] Date of Exam: 12/22/2019 Medical Rec #:  [id number]      Height:       58.0 in Accession #:    [id number]     Weight:       122.1 lb Date of Birth:  1934/03/31      BSA:          1.48 m Patient Age:    64 years       BP:           117/60 mmHg Patient Gender: F              HR:           118 bpm. Exam Location:  Inpatient Procedure: 2D Echo Indications:     Bacteremia 790.7 / R78.81  History:         Patient has prior history of Echocardiogram examinations, most                  recent 02/27/2018. Mitral Valve Disease,                  Arrythmias:Palpitations, Signs/Symptoms:Chest Pain and Dyspnea;                   Risk Factors:Diabetes and Hypertension. ESRD (end stage renal                  disease)                  Sepsis.  Sonographer:     Vikki Ports Turrentine Referring Phys:  [id number] Anderson [place] CHOI Diagnosing Phys: Sanda Klein MD IMPRESSIONS  1. Left ventricular ejection fraction, by visual estimation, is 70 to 75%. The left ventricle has hyperdynamic function. There is no left ventricular hypertrophy.  2. The left ventricle has no regional wall motion abnormalities.  3. Global right ventricle has mildly reduced systolic function.The right ventricular size is normal. No increase in right ventricular wall thickness.  4. Left atrial size was mildly dilated.  5. Right atrial size was normal.  6. Large vegetation on the mitral valve.  7. Moderate calcification of the mitral valve leaflet(s).  8. Moderate mitral annular calcification.  9. Moderate thickening of the mitral valve leaflet(s). 10. The mitral valve is degenerative. Moderate to severe mitral valve regurgitation. 11. The tricuspid valve is grossly normal. Tricuspid valve regurgitation moderate-severe. 12. The aortic valve is tricuspid. Aortic valve regurgitation is trivial. Mild aortic valve stenosis. 13. The pulmonic valve was grossly normal. Pulmonic valve regurgitation is not visualized. 14. Normal pulmonary artery systolic pressure. 15. Compared to 02/27/2018 there is a new mass attached to the posterior mitral leaflet a(pobably a vegetation given the  clinical scenario) and there is new mitral insufficiency, at least moderate in severity. 16. Prior images reviewed side by side. FINDINGS  Left Ventricle: Left ventricular ejection fraction, by visual estimation, is 70 to 75%. The left ventricle has hyperdynamic function. The left ventricle has no regional wall motion abnormalities. The left ventricular internal cavity size was the left ventricle is normal in size. There is no left ventricular hypertrophy. Right Ventricle: The right ventricular size is normal. No  increase in right ventricular wall thickness. Global RV systolic function is has mildly reduced systolic function. The tricuspid regurgitant velocity is 2.02 m/s, and with an assumed right atrial pressure of 8 mmHg, the estimated right ventricular systolic pressure is normal at 24.3 mmHg. Left Atrium: Left atrial size was mildly dilated. Right Atrium: Right atrial size was normal in size Pericardium: There is no evidence of pericardial effusion. Mitral Valve: The mitral valve is degenerative in appearance. There is moderate thickening of the mitral valve leaflet(s). There is moderate calcification of the mitral valve leaflet(s). Moderate mitral annular calcification. A large vegetation is seen on the posterior mitral leaflet. The MV vegetation measures 12 mm x 10 mm. Moderate to severe mitral valve regurgitation. MV peak gradient, 16.8 mmHg. Tricuspid Valve: The tricuspid valve is grossly normal. Tricuspid valve regurgitation moderate-severe. Aortic Valve: The aortic valve is tricuspid. . There is moderate thickening and moderate calcification of the aortic valve. Aortic valve regurgitation is trivial. Mild aortic stenosis is present. There is moderate thickening of the aortic valve. There is  moderate calcification of the aortic valve. Aortic valve mean gradient measures 7.0 mmHg. Aortic valve peak gradient measures 12.5 mmHg. Aortic valve area, by VTI measures 1.51 cm. Pulmonic Valve: The pulmonic valve was grossly normal. Pulmonic valve regurgitation is not visualized. Pulmonic regurgitation is not visualized. Aorta: The aortic root is normal in size and structure. IAS/Shunts: No atrial level shunt detected by color flow Doppler.  LEFT VENTRICLE PLAX 2D LVIDd:         3.50 cm  Diastology LVIDs:         1.80 cm  LV e' lateral: 5.76 cm/s LV PW:         1.00 cm LV IVS:        1.00 cm LVOT diam:     1.94 cm LV SV:         41 ml LV SV Index:   27.05 LVOT Area:     2.96 cm  RIGHT VENTRICLE RV S prime:     10.90 cm/s  TAPSE (M-mode): 0.7 cm LEFT ATRIUM             Index       RIGHT ATRIUM           Index LA diam:        4.10 cm 2.78 cm/m  RA Area:     10.90 cm LA Vol (A2C):   48.5 ml 32.84 ml/m RA Volume:   21.80 ml  14.76 ml/m LA Vol (A4C):   51.9 ml 35.14 ml/m LA Biplane Vol: 50.3 ml 34.06 ml/m  AORTIC VALVE AV Area (Vmax):    1.41 cm AV Area (Vmean):   1.27 cm AV Area (VTI):     1.51 cm AV Vmax:           177.00 cm/s AV Vmean:          124.000 cm/s AV VTI:            0.235 m AV Peak Grad:  12.5 mmHg AV Mean Grad:      7.0 mmHg LVOT Vmax:         84.50 cm/s LVOT Vmean:        53.300 cm/s LVOT VTI:          0.120 m LVOT/AV VTI ratio: 0.51  AORTA Ao Root diam: 2.00 cm MITRAL VALVE             TRICUSPID VALVE MV Peak grad: 16.8 mmHg  TR Peak grad:   16.3 mmHg MV Mean grad: 8.0 mmHg   TR Vmax:        202.00 cm/s MV Vmax:      2.05 m/s MV Vmean:     129.0 cm/s SHUNTS MV VTI:       0.29 m     Systemic VTI:  0.12 m                          Systemic Diam: 1.94 cm  Sanda Klein MD Electronically signed by Sanda Klein MD Signature Date/Time: 12/22/2019/4:08:00 PM    Final (Updated)      Medications:   . sodium chloride Stopped (12/22/19 2107)  . vancomycin     . aspirin  81 mg Oral Daily  . Chlorhexidine Gluconate Cloth  6 each Topical Q0600  . cloNIDine  0.1 mg Oral Daily  . darbepoetin (ARANESP) injection - DIALYSIS  150 mcg Intravenous Q Wed-HD  . doxercalciferol  1 mcg Intravenous Q M,W,F-HD  . feeding supplement (NEPRO CARB STEADY)  237 mL Oral BID BM  . heparin  5,000 Units Subcutaneous Q8H  . levothyroxine  25 mcg Oral Q0600  . multivitamin  1 tablet Oral QHS  . sevelamer carbonate  800 mg Oral TID WC   sodium chloride, acetaminophen, fentaNYL (SUBLIMAZE) injection, lidocaine, pantoprazole  Assessment/ Plan:  1. Sepsis/staph lugdunensis/gram variable rod/mitral valve endocarditis   TDC removed 12/21/2019 on MAxipime and Vanc.  Will place temporary dialysis catheter. 2. ESRD- MWF -last  HD 12/18 - TDC out 12/21 -we will plan tunneled dialysis catheter and dialysis either 12/23/2019 at 12-27-19 3. Hypertension/volume-clonidine has been resumed we will continue to follow 4. Anemia- due for redose today - no Fe hgb 10.3 5. Metabolic bone disease- Continue Hectorol/binders - parsabiv not available here 6. Nutrition- renal dietvit/nepro alb 1.9 7. L3 compression fx - symptom management 8. Hypothyroidism-last TSH within normal range 10/13/2019 9. DNR  10. Tremulousness-head CT showed new punctate calcification along the cortex of the right insula.   LOS: [place] @TODAY @11 :27 AM

## 2019-12-23 NOTE — Progress Notes (Signed)
Critical potassium level of 6.4. MD Avon Gully and nephrology MD Justin Mend paged. Waiting to see if any new orders are to be placed.

## 2019-12-23 NOTE — Progress Notes (Signed)
   Patient Status: [facility] - In-pt  Assessment and Plan: Patient in need of venous access.   Non tunneled dialysis catheter placement  ______________________________________________________________________   History of Present Illness: [name] is a 85 y.o. female   ESRD Bacteremia Tunneled catheter removed 12/22/19 Need new temp cath for urgent dialysis per Nephrology  Allergies and medications reviewed.   Review of Systems: A 12 point ROS discussed and pertinent positives are indicated in the HPI above.  All other systems are negative.   Vital Signs: BP 115/61 (BP Location: Left Leg)   Pulse (!) 118   Temp 97.8 F (36.6 C) (Axillary)   Resp (!) 21   Ht 4\' 10"  (1.473 m)   Wt 122 lb 2.2 oz (55.4 kg)   SpO2 96%   BMI 25.53 kg/m   Physical Exam Skin:    General: Skin is warm and dry.  Neurological:     Mental Status: She is disoriented.  Psychiatric:     Comments: Spoke to [employer] via phone Consented for procedure      Imaging reviewed.   Labs:  COAGS: No results for input(s): INR, APTT in the last 8760 hours.  BMP: Recent Labs    10/13/19 1027 12/15/2019 1340 12/21/19 0024 12/22/19 0732  NA 135 128* 127* 129*  K 3.4* 4.0 4.0 4.6  CL 90* 88* 86* 87*  CO2 29 21* 18* 15*  GLUCOSE 141* 158* 130* 169*  BUN 26* 57* 57* 81*  CALCIUM 8.5 8.0* 7.6* 8.4*  CREATININE 4.43* 8.43* 8.38* 10.54*  GFRNONAA  --  4* 4* 3*  GFRAA  --  5* 5* 3*    Scheduled for temporary dialysis catheter placement in IR Bacteremia Need for dialysis asap Consent signed with Dtr Glori Luis via phone   Electronically Signed: Lavonia Drafts, PA-C 12/23/2019, 12:27 PM   I spent a total of 15 minutes in face to face in clinical consultation, greater than 50% of which was counseling/coordinating care for venous access.

## 2019-12-23 NOTE — Progress Notes (Signed)
Pt not tolerated hemodialysis d/t pt had hypotension. Pt goal is not met and keep even. Dr. Carolin Sicks notified.

## 2019-12-23 NOTE — Progress Notes (Signed)
PROGRESS NOTE    [name]  [id number] DOB: 1934-08-24 DOA: 12/29/2019 PCP: Dorothyann Peng, NP     Brief Narrative:  [name] is a 85 y.o. female with medical history significant of  ESRD on HD MWF, PAD s/p bypass of left LE, hx of mesenteric ischemia, hypertension, COPD, Asthma, Type 2 diabetes who presents with concerns of dehydration. Patient has had decrease appetite for the past 2-3 days and daughter thought she was dehydrated and brought her to ED. She reportedly missed dialysis on Monday and Wednesday but had it on Friday. She was febrile with temperature up to 100.7 and hypotensive down to 66/26 with improved to 155/85 with 1L of LR. CT abdomen showed acute fracture involving the right lateral aspect of the L3 vertebral body, superior endplate and osteophyte complex. No acute intra-abdominal or pelvic findings. CXR showed no acute abnormalities. Negative pelvic and right hip X-ray.  Blood culture was positive for coag negative staph.  Patient has had some issues with her right IJ hemodialysis catheter.  After discussion with nephrology, decision was made to remove her dialysis catheter and give her a line holiday while treating for bacteremia.  New events last 24 hours / Subjective: No acute issues or events overnight, patient remains poorly verbal, follows simple commands but review of systems markedly limited.  Assessment & Plan:   Principal Problem:   Endocarditis of mitral valve Active Problems:   ESRD (end stage renal disease) (HCC)   Sepsis (HCC)   Hypotension   Increased anion gap metabolic acidosis   Acute kidney injury superimposed on CKD ([place])   Anemia due to end stage renal disease (HCC)   Hyponatremia   Closed L3 vertebral fracture (HCC)   Hemodialysis catheter infection ([place])   Bacteremia due to coagulase-negative Staphylococcus   Severe sepsis secondary to coag negative staph bacteremia with noted endocarditis, likely POA -Blood culture 12/20 with  staph lugdunensis and gram variable rod, pending final report -Hemodialysis catheter was removed on 12/21 -Worsening leukocytosis, afebrile for 48 hours -Repeat blood cultures - pending -Echo remarkable for mitral vegetation -MRI head to rule out thrombotic/septic emboli given mental status -CT A/P: negative for intra-abd findings -ID following: continue Vancomycin and cefepime  -TCTS sidelined - no acute indications - only possible remedy would be mitral valve replacement and patient is a poor candidate  Acute metabolic encephalopathy -Baseline mental status: Alert/oriented to at least person/situation - able to inform daughter previously about medical history and day to day activities -Worsening mental status over the past 2 weeks more acutely (maybe 3 months of generalized worsening mental status but timeline is questionable) -MRI brain above pending - will sideline neurology once we get results  ESRD on hemodialysis -Per nephrology. Hemodialysis catheter was removed on 12/21 to give line holiday  -Likely replacing on 12/23 pending clinical status with temp cath   Hyperkalemia -Likely 2/2 missed dialysis - defer to nephrology. Patient may be able to wait until dialysis as tentatively planned tomorrow - if unable to start dialysis may benefit from kayexalate/similar  Acute L3 compression fracture -Status post fall -CT A/P: Acute fracture involving the right lateral aspect of the L3 vertebral body, superior endplate and osteophyte complex. Multilevel spondylosis and ankylosis as described. -PT OT    Hypothyroidism -Continue Synthroid  Essential hypertension -Resume clonidine.  Continue to hold amlodipine due to hypotension on admission  Hyponatremia -Stable   DVT prophylaxis: Subcutaneous heparin Code Status: DNR Family Communication: Daughter updated over the phone -lengthy discussion,  understands patient remains high risk for worsening status given diffuse infection, unable  to tolerate p.o. given worsening mental status. Disposition Plan: Pending clinical status - patient remains high risk for decompensation. Given worsening mental status per report and advanced age if patient continues to decline would consider palliative care/hospice discussion with family.   Consultants:   Nephrology  IR  TCTS  Procedures:   Removal of tunneled right IJ dialysis catheter 12/21  Antimicrobials:  Anti-infectives (From admission, onward)   Start     Dose/Rate Route Frequency Ordered Stop   12/21/19 1900  ceFEPIme (MAXIPIME) 1 g in sodium chloride 0.9 % 100 mL IVPB  Status:  Discontinued     1 g 200 mL/hr over 30 Minutes Intravenous Every 24 hours 12/11/2019 1818 12/23/19 0901   12/21/19 1200  vancomycin (VANCOREADY) IVPB 500 mg/100 mL     500 mg 100 mL/hr over 60 Minutes Intravenous Every M-W-F (Hemodialysis) 12/21/19 1003     12/21/19 0000  vancomycin variable dose per unstable renal function (pharmacist dosing)  Status:  Discontinued      Does not apply See admin instructions 12/19/2019 1818 12/21/19 1003   12/25/2019 1830  vancomycin (VANCOREADY) IVPB 1250 mg/250 mL     1,250 mg 166.7 mL/hr over 90 Minutes Intravenous  Once 12/08/2019 1818 12/02/2019 2143   12/31/2019 1830  ceFEPIme (MAXIPIME) 2 g in sodium chloride 0.9 % 100 mL IVPB     2 g 200 mL/hr over 30 Minutes Intravenous  Once 12/13/2019 1818 12/15/2019 2314       Objective: Vitals:   12/23/19 0625 12/23/19 0801 12/23/19 1115 12/23/19 1242  BP:  (!) 108/48 115/61   Pulse:      Resp:  (!) 26 (!) 21   Temp: (!) 97.3 F (36.3 C) 97.7 F (36.5 C) 97.8 F (36.6 C)   TempSrc: Oral Axillary Axillary   SpO2: 100% 100% 96% (!) 40%  Weight:      Height:        Intake/Output Summary (Last 24 hours) at 12/23/2019 1332 Last data filed at 12/23/2019 0300 Gross per 24 hour  Intake 106.27 ml  Output --  Net 106.27 ml   Filed Weights   12/26/2019 1210 12/21/19 0005 12/22/19 0450  Weight: 52.6 kg 55 kg 55.4 kg     Examination:  General exam: Appears calm,  shaky and tremulous, following simple commands, nodding head yes/no appropriately to simple questions Respiratory system: Clear to auscultation. Respiratory effort normal. No respiratory distress.  Cardiovascular system: S1 & S2 heard, tachycardic, regular rhythm.  Gastrointestinal system: Abdomen is nondistended, soft and nontender. Normal bowel sounds heard. Central nervous system: Alert and oriented to self and place Extremities: Symmetric in appearance  Skin: No rashes, lesions or ulcers on exposed skin    Data Reviewed: I have personally reviewed following labs and imaging studies  CBC: Recent Labs  Lab 12/07/2019 1423 12/21/19 0430 12/22/19 0732 12/23/19 0432  WBC 20.6*  --  24.7* 28.5*  NEUTROABS 17.6*  --   --   --   HGB 10.3*  --  10.1* 10.8*  HCT 32.7* 30.2* 31.2* 33.3*  MCV 89.6  --  86.7 87.6  PLT 191  --  129* [id number]*   Basic Metabolic Panel: Recent Labs  Lab 12/08/2019 1340 12/21/19 0024 12/22/19 0732 12/23/19 1235  NA 128* 127* 129* 129*  K 4.0 4.0 4.6 6.4*  CL 88* 86* 87* 86*  CO2 21* 18* 15* 9*  GLUCOSE 158* 130* 169* 149*  BUN 57* 57* 81* 109*  CREATININE 8.43* 8.38* 10.54* 12.11*  CALCIUM 8.0* 7.6* 8.4* 8.1*  MG  --   --  2.3  --    GFR: Estimated Creatinine Clearance: 2.6 mL/min (A) (by C-G formula based on SCr of 12.11 mg/dL (H)). Liver Function Tests: Recent Labs  Lab 12/09/2019 1340 12/21/19 0024  AST 28 23  ALT 13 10  ALKPHOS 90 78  BILITOT 0.4 0.8  PROT 7.4 6.2*  ALBUMIN 2.7* 1.9*   No results for input(s): LIPASE, AMYLASE in the last 168 hours. No results for input(s): AMMONIA in the last 168 hours. Coagulation Profile: No results for input(s): INR, PROTIME in the last 168 hours. Cardiac Enzymes: No results for input(s): CKTOTAL, CKMB, CKMBINDEX, TROPONINI in the last 168 hours. BNP (last 3 results) No results for input(s): PROBNP in the last 8760 hours. HbA1C: No results for input(s):  HGBA1C in the last 72 hours. CBG: Recent Labs  Lab 12/22/19 1110 12/22/19 1648 12/22/19 2105 12/23/19 0608 12/23/19 1200  GLUCAP 148* 152* 146* 167* 154*   Lipid Profile: No results for input(s): CHOL, HDL, LDLCALC, TRIG, CHOLHDL, LDLDIRECT in the last 72 hours. Thyroid Function Tests: No results for input(s): TSH, T4TOTAL, FREET4, T3FREE, THYROIDAB in the last 72 hours. Anemia Panel: Recent Labs    12/21/19 0430  VITAMINB12 2,625*   Sepsis Labs: Recent Labs  Lab 12/21/19 0024  LATICACIDVEN 1.8    Recent Results (from the past 240 hour(s))  SARS CORONAVIRUS 2 (TAT 6-24 HRS) Nasopharyngeal Nasopharyngeal Swab     Status: None   Collection Time: 12/09/2019 12:17 PM   Specimen: Nasopharyngeal Swab  Result Value Ref Range Status   SARS Coronavirus 2 NEGATIVE NEGATIVE Final    Comment: (NOTE) SARS-CoV-2 target nucleic acids are NOT DETECTED. The SARS-CoV-2 RNA is generally detectable in upper and lower respiratory specimens during the acute phase of infection. Negative results do not preclude SARS-CoV-2 infection, do not rule out co-infections with other pathogens, and should not be used as the sole basis for treatment or other patient management decisions. Negative results must be combined with clinical observations, patient history, and epidemiological information. The expected result is Negative. Fact Sheet for Patients: SugarRoll.be Fact Sheet for Healthcare Providers: https://www.woods-mathews.com/ This test is not yet approved or cleared by the [place] FDA and  has been authorized for detection and/or diagnosis of SARS-CoV-2 by FDA under an Emergency Use Authorization (EUA). This EUA will remain  in effect (meaning this test can be used) for the duration of the COVID-19 declaration under Section 56 4(b)(1) of the Act, 21 U.S.C. section 360bbb-3(b)(1), unless the authorization is terminated or revoked sooner. Performed  at [facility], [place] [address] [place], [place] [address]   Blood culture (routine x 2)     Status: Abnormal   Collection Time: 12/19/2019  7:00 PM   Specimen: BLOOD RIGHT ARM  Result Value Ref Range Status   Specimen Description BLOOD RIGHT ARM  Final   Special Requests   Final    BOTTLES DRAWN AEROBIC AND ANAEROBIC Blood Culture results may not be optimal due to an inadequate volume of blood received in culture bottles   Culture  Setup Time (A)  Final    GRAM VARIABLE ROD AEROBIC BOTTLE ONLY CRITICAL RESULT CALLED TO, READ BACK BY AND VERIFIED WITH: Sharen Heck PharmD 10:10 12/21/19 ([email or web address]) GRAM POSITIVE COCCI ANAEROBIC BOTTLE ONLY CRITICAL RESULT CALLED TO, READ BACK BY AND VERIFIED WITH: Williamson [id number]  MLM Performed at [facility], [place] [address] [place], [place] [address]    Culture BACILLUS SPECIES STAPHYLOCOCCUS LUGDUNENSIS  (A)  Final   Report Status 12/23/2019 FINAL  Final   Organism ID, Bacteria STAPHYLOCOCCUS LUGDUNENSIS  Final      Susceptibility   Staphylococcus lugdunensis - MIC*    CIPROFLOXACIN <=0.5 SENSITIVE Sensitive     ERYTHROMYCIN >=8 RESISTANT Resistant     GENTAMICIN <=0.5 SENSITIVE Sensitive     OXACILLIN 0.5 SENSITIVE Sensitive     TETRACYCLINE <=1 SENSITIVE Sensitive     VANCOMYCIN 1 SENSITIVE Sensitive     TRIMETH/SULFA <=10 SENSITIVE Sensitive     CLINDAMYCIN >=8 RESISTANT Resistant     RIFAMPIN <=0.5 SENSITIVE Sensitive     Inducible Clindamycin NEGATIVE Sensitive     * STAPHYLOCOCCUS LUGDUNENSIS  Blood Culture ID Panel (Reflexed)     Status: Abnormal   Collection Time: 12/31/2019  7:00 PM  Result Value Ref Range Status   Enterococcus species NOT DETECTED NOT DETECTED Final   Listeria monocytogenes NOT DETECTED NOT DETECTED Final   Staphylococcus species DETECTED (A) NOT DETECTED Final    Comment: Methicillin (oxacillin) susceptible coagulase negative staphylococcus. Possible blood culture contaminant (unless  isolated from more than one blood culture draw or clinical case suggests pathogenicity). No antibiotic treatment is indicated for blood  culture contaminants. CRITICAL RESULT CALLED TO, READ BACK BY AND VERIFIED WITH: Sharen Heck PharmD 10:10 12/21/19 ([email or web address])    Staphylococcus aureus (BCID) NOT DETECTED NOT DETECTED Final   Methicillin resistance NOT DETECTED NOT DETECTED Final   Streptococcus species NOT DETECTED NOT DETECTED Final   Streptococcus agalactiae NOT DETECTED NOT DETECTED Final   Streptococcus pneumoniae NOT DETECTED NOT DETECTED Final   Streptococcus pyogenes NOT DETECTED NOT DETECTED Final   Acinetobacter baumannii NOT DETECTED NOT DETECTED Final   Enterobacteriaceae species NOT DETECTED NOT DETECTED Final   Enterobacter cloacae complex NOT DETECTED NOT DETECTED Final   Escherichia coli NOT DETECTED NOT DETECTED Final   Klebsiella oxytoca NOT DETECTED NOT DETECTED Final   Klebsiella pneumoniae NOT DETECTED NOT DETECTED Final   Proteus species NOT DETECTED NOT DETECTED Final   Serratia marcescens NOT DETECTED NOT DETECTED Final   Haemophilus influenzae NOT DETECTED NOT DETECTED Final   Neisseria meningitidis NOT DETECTED NOT DETECTED Final   Pseudomonas aeruginosa NOT DETECTED NOT DETECTED Final   Candida albicans NOT DETECTED NOT DETECTED Final   Candida glabrata NOT DETECTED NOT DETECTED Final   Candida krusei NOT DETECTED NOT DETECTED Final   Candida parapsilosis NOT DETECTED NOT DETECTED Final   Candida tropicalis NOT DETECTED NOT DETECTED Final    Comment: Performed at [facility], [address], [place],  [address]  Respiratory Panel by PCR     Status: None   Collection Time: 12/02/2019 10:07 PM   Specimen: Nasopharyngeal Swab; Respiratory  Result Value Ref Range Status   Adenovirus NOT DETECTED NOT DETECTED Final   Coronavirus 229E NOT DETECTED NOT DETECTED Final    Comment: (NOTE) The Coronavirus on the Respiratory Panel, DOES NOT test for the  novel  Coronavirus (2019 nCoV)    Coronavirus HKU1 NOT DETECTED NOT DETECTED Final   Coronavirus NL63 NOT DETECTED NOT DETECTED Final   Coronavirus OC43 NOT DETECTED NOT DETECTED Final   Metapneumovirus NOT DETECTED NOT DETECTED Final   Rhinovirus / Enterovirus NOT DETECTED NOT DETECTED Final   Influenza A NOT DETECTED NOT DETECTED Final   Influenza B NOT DETECTED NOT DETECTED Final  Parainfluenza Virus 1 NOT DETECTED NOT DETECTED Final   Parainfluenza Virus 2 NOT DETECTED NOT DETECTED Final   Parainfluenza Virus 3 NOT DETECTED NOT DETECTED Final   Parainfluenza Virus 4 NOT DETECTED NOT DETECTED Final   Respiratory Syncytial Virus NOT DETECTED NOT DETECTED Final   Bordetella pertussis NOT DETECTED NOT DETECTED Final   Chlamydophila pneumoniae NOT DETECTED NOT DETECTED Final   Mycoplasma pneumoniae NOT DETECTED NOT DETECTED Final    Comment: Performed at [facility], [place] [address], [place], [place] [address]  Blood culture (routine x 2)     Status: None (Preliminary result)   Collection Time: 12/21/19 12:24 AM   Specimen: BLOOD  Result Value Ref Range Status   Specimen Description BLOOD RIGHT HAND  Final   Special Requests   Final    BOTTLES DRAWN AEROBIC ONLY Blood Culture results may not be optimal due to an inadequate volume of blood received in culture bottles   Culture  Setup Time   Final    AEROBIC BOTTLE ONLY GRAM POSITIVE COCCI CRITICAL VALUE NOTED.  VALUE IS CONSISTENT WITH PREVIOUSLY REPORTED AND CALLED VALUE.    Culture   Final    TOO YOUNG TO READ Performed at [facility], [place] [address], [place], [place] [address]    Report Status PENDING  Incomplete  Culture, blood (routine x 2)     Status: None (Preliminary result)   Collection Time: 12/22/19  7:32 AM   Specimen: BLOOD RIGHT HAND  Result Value Ref Range Status   Specimen Description BLOOD RIGHT HAND  Final   Special Requests   Final    BOTTLES DRAWN AEROBIC ONLY Blood Culture results may not be  optimal due to an inadequate volume of blood received in culture bottles   Culture   Final    NO GROWTH 1 DAY Performed at [facility], [place] [address], [place], [place] [address]    Report Status PENDING  Incomplete  Culture, blood (routine x 2)     Status: None (Preliminary result)   Collection Time: 12/22/19  7:50 AM   Specimen: BLOOD LEFT HAND  Result Value Ref Range Status   Specimen Description BLOOD LEFT HAND  Final   Special Requests   Final    BOTTLES DRAWN AEROBIC ONLY Blood Culture results may not be optimal due to an inadequate volume of blood received in culture bottles   Culture   Final    NO GROWTH 1 DAY Performed at [facility], [place] [address] [place], [place] [address]    Report Status PENDING  Incomplete      Radiology Studies: CT HEAD WO CONTRAST  Result Date: 12/23/2019 CLINICAL DATA:  Fall earlier this month, now with tremors EXAM: CT HEAD WITHOUT CONTRAST TECHNIQUE: Contiguous axial images were obtained from the base of the skull through the vertex without intravenous contrast. COMPARISON:  CT head 10/10/2019 FINDINGS: Brain: There is a new punctate focus of calcification along the cortex of the anterior right insula, not present on comparison imaging is recent is 12/10/2019. The presence of this finding is worrisome for a possible distal branch calcified thrombus. However, there is no CT evidence of acute infarction. No acute intracranial hemorrhage, hydrocephalus, extra-axial collection or mass lesion/mass effect is seen either. Symmetric prominence of the ventricles, cisterns and sulci compatible with parenchymal volume loss. Patchy areas of white matter hypoattenuation are most compatible with chronic microvascular angiopathy. Vascular: Punctate calcification along the right anterior insular,  possibly within the vascular branch. No other hyperdense vessels. Atherosclerotic calcification of the carotid siphons and intradural vertebral arteries. Skull:  No calvarial fracture or suspicious osseous lesion. No scalp swelling or hematoma. Sinuses/Orbits: A apical and paranasal sinuses and mastoid air cells are predominantly clear. Orbital structures are unremarkable aside from prior lens extractions. Other: None IMPRESSION: New punctate calcification along the cortex of the right insula, new since comparison. Appearance is nonspecific, could feasibly reflect some calcified intravascular thrombus versus dural calcification developing in the interim. No acute CT evidence of infarct is seen. If there is persisting concern for ischemia, MRI is more sensitive and specific. These results will be called to the ordering clinician or representative by the Radiologist Assistant, and communication documented in the PACS or zVision Dashboard. Electronically Signed   By: Lovena Le M.D.   On: 12/23/2019 04:11   IR Removal Tun Cv Cath W/O FL  Result Date: 12/21/2019 INDICATION: End-stage renal disease. Bacteremia. Request removal of tunneled hemodialysis catheter. EXAM: REMOVAL OF TUNNELED RIGHT IJ HEMODIALYSIS CATHETER MEDICATIONS: None COMPLICATIONS: None immediate. PROCEDURE: Informed written consent was obtained from the patient following an explanation of the procedure, risks, benefits and alternatives to treatment. A time out was performed prior to the initiation of the procedure. Maximal barrier sterile technique was utilized including caps, mask, sterile gowns, sterile gloves, large sterile drape, hand hygiene, and chlorhexidine. 1% lidocaine was injected under sterile conditions along the subcutaneous tunnel. Utilizing a combination of blunt dissection and gentle traction, the cuff of the catheter was exposed and the catheter was removed intact. Hemostasis was obtained with manual compression. A dressing was placed. The patient tolerated the procedure well without immediate post procedural complication. IMPRESSION: Successful removal of tunneled right IJ dialysis  catheter. Read by: Ascencion Dike PA-C Electronically Signed   By: Jacqulynn Cadet M.D.   On: 12/21/2019 15:00   ECHOCARDIOGRAM COMPLETE  Result Date: 12/22/2019   ECHOCARDIOGRAM REPORT   Patient Name:   AUBRIONNA HOLEMAN Date of Exam: 12/22/2019 Medical Rec #:  [id number]      Height:       58.0 in Accession #:    [id number]     Weight:       122.1 lb Date of Birth:  [birth year]      BSA:          1.48 m Patient Age:    70 years       BP:           117/60 mmHg Patient Gender: F              HR:           118 bpm. Exam Location:  Inpatient Procedure: 2D Echo Indications:     Bacteremia 790.7 / R78.81  History:         Patient has prior history of Echocardiogram examinations, most                  recent 02/27/2018. Mitral Valve Disease,                  Arrythmias:Palpitations, Signs/Symptoms:Chest Pain and Dyspnea;                  Risk Factors:Diabetes and Hypertension. ESRD (end stage renal                  disease)                  Sepsis.  Sonographer:     Vikki Ports Turrentine Referring Phys:  [id number] Anderson [place] CHOI Diagnosing Phys: Sanda Klein MD IMPRESSIONS  1. Left ventricular ejection fraction, by visual estimation, is 70 to 75%. The left ventricle has hyperdynamic function. There is no left ventricular hypertrophy.  2. The left ventricle has no regional wall motion abnormalities.  3. Global right ventricle has mildly reduced systolic function.The right ventricular size is normal. No increase in right ventricular wall thickness.  4. Left atrial size was mildly dilated.  5. Right atrial size was normal.  6. Large vegetation on the mitral valve.  7. Moderate calcification of the mitral valve leaflet(s).  8. Moderate mitral annular calcification.  9. Moderate thickening of the mitral valve leaflet(s). 10. The mitral valve is degenerative. Moderate to severe mitral valve regurgitation. 11. The tricuspid valve is grossly normal. Tricuspid valve regurgitation moderate-severe. 12. The aortic valve is tricuspid.  Aortic valve regurgitation is trivial. Mild aortic valve stenosis. 13. The pulmonic valve was grossly normal. Pulmonic valve regurgitation is not visualized. 14. Normal pulmonary artery systolic pressure. 15. Compared to 02/27/2018 there is a new mass attached to the posterior mitral leaflet a(pobably a vegetation given the clinical scenario) and there is new mitral insufficiency, at least moderate in severity. 16. Prior images reviewed side by side. FINDINGS  Left Ventricle: Left ventricular ejection fraction, by visual estimation, is 70 to 75%. The left ventricle has hyperdynamic function. The left ventricle has no regional wall motion abnormalities. The left ventricular internal cavity size was the left ventricle is normal in size. There is no left ventricular hypertrophy. Right Ventricle: The right ventricular size is normal. No increase in right ventricular wall thickness. Global RV systolic function is has mildly reduced systolic function. The tricuspid regurgitant velocity is 2.02 m/s, and with an assumed right atrial pressure of 8 mmHg, the estimated right ventricular systolic pressure is normal at 24.3 mmHg. Left Atrium: Left atrial size was mildly dilated. Right Atrium: Right atrial size was normal in size Pericardium: There is no evidence of pericardial effusion. Mitral Valve: The mitral valve is degenerative in appearance. There is moderate thickening of the mitral valve leaflet(s). There is moderate calcification of the mitral valve leaflet(s). Moderate mitral annular calcification. A large vegetation is seen on the posterior mitral leaflet. The MV vegetation measures 12 mm x 10 mm. Moderate to severe mitral valve regurgitation. MV peak gradient, 16.8 mmHg. Tricuspid Valve: The tricuspid valve is grossly normal. Tricuspid valve regurgitation moderate-severe. Aortic Valve: The aortic valve is tricuspid. . There is moderate thickening and moderate calcification of the aortic valve. Aortic valve  regurgitation is trivial. Mild aortic stenosis is present. There is moderate thickening of the aortic valve. There is  moderate calcification of the aortic valve. Aortic valve mean gradient measures 7.0 mmHg. Aortic valve peak gradient measures 12.5 mmHg. Aortic valve area, by VTI measures 1.51 cm. Pulmonic Valve: The pulmonic valve was grossly normal. Pulmonic valve regurgitation is not visualized. Pulmonic regurgitation is not visualized. Aorta: The aortic root is normal in size and structure. IAS/Shunts: No atrial level shunt detected by color flow Doppler.  LEFT VENTRICLE PLAX 2D LVIDd:         3.50 cm  Diastology LVIDs:         1.80 cm  LV e' lateral: 5.76 cm/s LV PW:         1.00 cm LV IVS:        1.00 cm LVOT diam:     1.94 cm LV SV:  41 ml LV SV Index:   27.05 LVOT Area:     2.96 cm  RIGHT VENTRICLE RV S prime:     10.90 cm/s TAPSE (M-mode): 0.7 cm LEFT ATRIUM             Index       RIGHT ATRIUM           Index LA diam:        4.10 cm 2.78 cm/m  RA Area:     10.90 cm LA Vol (A2C):   48.5 ml 32.84 ml/m RA Volume:   21.80 ml  14.76 ml/m LA Vol (A4C):   51.9 ml 35.14 ml/m LA Biplane Vol: 50.3 ml 34.06 ml/m  AORTIC VALVE AV Area (Vmax):    1.41 cm AV Area (Vmean):   1.27 cm AV Area (VTI):     1.51 cm AV Vmax:           177.00 cm/s AV Vmean:          124.000 cm/s AV VTI:            0.235 m AV Peak Grad:      12.5 mmHg AV Mean Grad:      7.0 mmHg LVOT Vmax:         84.50 cm/s LVOT Vmean:        53.300 cm/s LVOT VTI:          0.120 m LVOT/AV VTI ratio: 0.51  AORTA Ao Root diam: 2.00 cm MITRAL VALVE             TRICUSPID VALVE MV Peak grad: 16.8 mmHg  TR Peak grad:   16.3 mmHg MV Mean grad: 8.0 mmHg   TR Vmax:        202.00 cm/s MV Vmax:      2.05 m/s MV Vmean:     129.0 cm/s SHUNTS MV VTI:       0.29 m     Systemic VTI:  0.12 m                          Systemic Diam: 1.94 cm  Dani Gobble Croitoru MD Electronically signed by Sanda Klein MD Signature Date/Time: 12/22/2019/4:08:00 PM    Final (Updated)        Scheduled Meds: . aspirin  81 mg Oral Daily  . Chlorhexidine Gluconate Cloth  6 each Topical Q0600  . Chlorhexidine Gluconate Cloth  6 each Topical Q0600  . cloNIDine  0.1 mg Oral Daily  . darbepoetin (ARANESP) injection - DIALYSIS  150 mcg Intravenous Q Wed-HD  . doxercalciferol  1 mcg Intravenous Q M,W,F-HD  . feeding supplement (NEPRO CARB STEADY)  237 mL Oral BID BM  . heparin  5,000 Units Subcutaneous Q8H  . levothyroxine  25 mcg Oral Q0600  . multivitamin  1 tablet Oral QHS  . sevelamer carbonate  800 mg Oral TID WC   Continuous Infusions: . sodium chloride Stopped (12/22/19 2107)  . vancomycin       LOS: 3 days   Time spent: 45 minutes   Little Ishikawa, DO [facility] 12/23/2019, 1:32 PM   Available via Epic secure chat 7am-7pm After these hours, please refer to coverage provider listed on [email or web address]

## 2019-12-23 NOTE — Progress Notes (Signed)
[facility] for Infectious Disease  Date of Admission:  12/07/2019      Total days of antibiotics 4  Day 4 vancomycin            ASSESSMENT: [name] is a 85 y.o. 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 While this is sensitive to cefazolin will continue with Vancomycin for better CNS coverage.   Repeated cultures are no growth 48h; IR planning for replacement of a temporary HD line for urgent HD needs (last session 12/19).   Would ask CT surgery to evaluate to ensure medical therapy alone is indicated. Will obtain MRI of the head to follow up CT scan to assess for CNS emboli to assist with future conversations re: prognosis; if she has not embolized already she is at highest risk over the next 10-14 days to do so.   Bacillus growing in 1/2 cultures likely reflective of skin contaminant from lab draw. Covered by vancomycin, however should this be pathologic.    PLAN: 1. Hold on permanent access - noted she needs temp line placed for HD treatment today.  2. TCTS consult 3. Brain MRI WO contrast  4. Follow repeat BCx to ensure no growth    Principal Problem:   Endocarditis of mitral valve Active Problems:   ESRD (end stage renal disease) (HCC)   Sepsis (HCC)   Hypotension   Increased anion gap metabolic acidosis   Acute kidney injury superimposed on CKD ([place])   Anemia due to end stage renal disease (HCC)   Hyponatremia   Closed L3 vertebral fracture (HCC)   Hemodialysis catheter infection ([place])   Bacteremia due to coagulase-negative Staphylococcus   . aspirin  81 mg Oral Daily  . Chlorhexidine Gluconate Cloth  6 each Topical Q0600  . Chlorhexidine Gluconate Cloth  6 each Topical Q0600  . cloNIDine  0.1 mg Oral Daily  . darbepoetin (ARANESP) injection - DIALYSIS  150 mcg Intravenous Q Wed-HD  . doxercalciferol  1 mcg Intravenous Q M,W,F-HD   . feeding supplement (NEPRO CARB STEADY)  237 mL Oral BID BM  . heparin  5,000 Units Subcutaneous Q8H  . levothyroxine  25 mcg Oral Q0600  . multivitamin  1 tablet Oral QHS  . sevelamer carbonate  800 mg Oral TID WC    SUBJECTIVE: Awake. Not purposeful and does not respond to questions. Upper extremity tremors noted.   TTE with +large vegetation on MV (1.2 x 1.0 cm)  Review of Systems: Review of Systems  Unable to perform ROS: Mental acuity    Allergies  Allergen Reactions  . Ace Inhibitors Other (See Comments)    Reaction unknown    OBJECTIVE: Vitals:   12/23/19 0625 12/23/19 0801 12/23/19 1115 12/23/19 1242  BP:  (!) 108/48 115/61   Pulse:      Resp:  (!) 26 (!) 21   Temp: (!) 97.3 F (36.3 C) 97.7 F (36.5 C) 97.8 F (36.6 C)   TempSrc: Oral Axillary Axillary   SpO2: 100% 100% 96% (!) 40%  Weight:      Height:       Body mass index is 25.53 kg/m.  Physical Exam Constitutional:      Appearance: She is ill-appearing.  HENT:     Mouth/Throat:     Mouth: Mucous membranes are moist.     Pharynx: Oropharynx is clear.  Eyes:  General: No scleral icterus.    Pupils: Pupils are equal, round, and reactive to light.  Cardiovascular:     Rate and Rhythm: Regular rhythm. Tachycardia present.  Pulmonary:     Effort: Pulmonary effort is normal.     Breath sounds: Normal breath sounds.  Abdominal:     General: Bowel sounds are normal. There is no distension.  Musculoskeletal:        General: No swelling.  Skin:    General: Skin is warm and dry.     Capillary Refill: Capillary refill takes less than 2 seconds.  Neurological:     Mental Status: She is alert.     Comments: Does not make eye contact. Tremors noted.      Lab Results Lab Results  Component Value Date   WBC 28.5 (H) 12/23/2019   HGB 10.8 (L) 12/23/2019   HCT 33.3 (L) 12/23/2019   MCV 87.6 12/23/2019   PLT 112 (L) 12/23/2019    Lab Results  Component Value Date   CREATININE 12.11 (H)  12/23/2019   BUN 109 (H) 12/23/2019   NA 129 (L) 12/23/2019   K 6.4 (HH) 12/23/2019   CL 86 (L) 12/23/2019   CO2 9 (L) 12/23/2019    Lab Results  Component Value Date   ALT 10 12/21/2019   AST 23 12/21/2019   ALKPHOS 78 12/21/2019   BILITOT 0.8 12/21/2019     Microbiology: Recent Results (from the past 240 hour(s))  SARS CORONAVIRUS 2 (TAT 6-24 HRS) Nasopharyngeal Nasopharyngeal Swab     Status: None   Collection Time: 12/23/2019 12:17 PM   Specimen: Nasopharyngeal Swab  Result Value Ref Range Status   SARS Coronavirus 2 NEGATIVE NEGATIVE Final    Comment: (NOTE) SARS-CoV-2 target nucleic acids are NOT DETECTED. The SARS-CoV-2 RNA is generally detectable in upper and lower respiratory specimens during the acute phase of infection. Negative results do not preclude SARS-CoV-2 infection, do not rule out co-infections with other pathogens, and should not be used as the sole basis for treatment or other patient management decisions. Negative results must be combined with clinical observations, patient history, and epidemiological information. The expected result is Negative. Fact Sheet for Patients: SugarRoll.be Fact Sheet for Healthcare Providers: https://www.woods-mathews.com/ This test is not yet approved or cleared by the [place] FDA and  has been authorized for detection and/or diagnosis of SARS-CoV-2 by FDA under an Emergency Use Authorization (EUA). This EUA will remain  in effect (meaning this test can be used) for the duration of the COVID-19 declaration under Section 56 4(b)(1) of the Act, 21 U.S.C. section 360bbb-3(b)(1), unless the authorization is terminated or revoked sooner. Performed at [facility], [place] [address] [place], [place] [address]   Blood culture (routine x 2)     Status: Abnormal   Collection Time: 12/29/2019  7:00 PM   Specimen: BLOOD RIGHT ARM  Result Value Ref Range Status   Specimen  Description BLOOD RIGHT ARM  Final   Special Requests   Final    BOTTLES DRAWN AEROBIC AND ANAEROBIC Blood Culture results may not be optimal due to an inadequate volume of blood received in culture bottles   Culture  Setup Time (A)  Final    GRAM VARIABLE ROD AEROBIC BOTTLE ONLY CRITICAL RESULT CALLED TO, READ BACK BY AND VERIFIED WITH: Sharen Heck PharmD 10:10 12/21/19 ([email or web address]) GRAM POSITIVE COCCI ANAEROBIC BOTTLE ONLY CRITICAL RESULT CALLED TO, READ BACK BY AND VERIFIED WITH: [place] [id number] MLM  Performed at [facility], [place] [address] [place], [place] [address]    Culture BACILLUS SPECIES STAPHYLOCOCCUS LUGDUNENSIS  (A)  Final   Report Status 12/23/2019 FINAL  Final   Organism ID, Bacteria STAPHYLOCOCCUS LUGDUNENSIS  Final      Susceptibility   Staphylococcus lugdunensis - MIC*    CIPROFLOXACIN <=0.5 SENSITIVE Sensitive     ERYTHROMYCIN >=8 RESISTANT Resistant     GENTAMICIN <=0.5 SENSITIVE Sensitive     OXACILLIN 0.5 SENSITIVE Sensitive     TETRACYCLINE <=1 SENSITIVE Sensitive     VANCOMYCIN 1 SENSITIVE Sensitive     TRIMETH/SULFA <=10 SENSITIVE Sensitive     CLINDAMYCIN >=8 RESISTANT Resistant     RIFAMPIN <=0.5 SENSITIVE Sensitive     Inducible Clindamycin NEGATIVE Sensitive     * STAPHYLOCOCCUS LUGDUNENSIS  Blood Culture ID Panel (Reflexed)     Status: Abnormal   Collection Time: 12/16/2019  7:00 PM  Result Value Ref Range Status   Enterococcus species NOT DETECTED NOT DETECTED Final   Listeria monocytogenes NOT DETECTED NOT DETECTED Final   Staphylococcus species DETECTED (A) NOT DETECTED Final    Comment: Methicillin (oxacillin) susceptible coagulase negative staphylococcus. Possible blood culture contaminant (unless isolated from more than one blood culture draw or clinical case suggests pathogenicity). No antibiotic treatment is indicated for blood  culture contaminants. CRITICAL RESULT CALLED TO, READ BACK BY AND VERIFIED WITH: Sharen Heck PharmD 10:10 12/21/19 ([email or web address])    Staphylococcus aureus (BCID) NOT DETECTED NOT DETECTED Final   Methicillin resistance NOT DETECTED NOT DETECTED Final   Streptococcus species NOT DETECTED NOT DETECTED Final   Streptococcus agalactiae NOT DETECTED NOT DETECTED Final   Streptococcus pneumoniae NOT DETECTED NOT DETECTED Final   Streptococcus pyogenes NOT DETECTED NOT DETECTED Final   Acinetobacter baumannii NOT DETECTED NOT DETECTED Final   Enterobacteriaceae species NOT DETECTED NOT DETECTED Final   Enterobacter cloacae complex NOT DETECTED NOT DETECTED Final   Escherichia coli NOT DETECTED NOT DETECTED Final   Klebsiella oxytoca NOT DETECTED NOT DETECTED Final   Klebsiella pneumoniae NOT DETECTED NOT DETECTED Final   Proteus species NOT DETECTED NOT DETECTED Final   Serratia marcescens NOT DETECTED NOT DETECTED Final   Haemophilus influenzae NOT DETECTED NOT DETECTED Final   Neisseria meningitidis NOT DETECTED NOT DETECTED Final   Pseudomonas aeruginosa NOT DETECTED NOT DETECTED Final   Candida albicans NOT DETECTED NOT DETECTED Final   Candida glabrata NOT DETECTED NOT DETECTED Final   Candida krusei NOT DETECTED NOT DETECTED Final   Candida parapsilosis NOT DETECTED NOT DETECTED Final   Candida tropicalis NOT DETECTED NOT DETECTED Final    Comment: Performed at [facility], [address] [place], [place] [address]  Respiratory Panel by PCR     Status: None   Collection Time: 12/02/2019 10:07 PM   Specimen: Nasopharyngeal Swab; Respiratory  Result Value Ref Range Status   Adenovirus NOT DETECTED NOT DETECTED Final   Coronavirus 229E NOT DETECTED NOT DETECTED Final    Comment: (NOTE) The Coronavirus on the Respiratory Panel, DOES NOT test for the novel  Coronavirus (2019 nCoV)    Coronavirus HKU1 NOT DETECTED NOT DETECTED Final   Coronavirus NL63 NOT DETECTED NOT DETECTED Final   Coronavirus OC43 NOT DETECTED NOT DETECTED Final   Metapneumovirus NOT DETECTED  NOT DETECTED Final   Rhinovirus / Enterovirus NOT DETECTED NOT DETECTED Final   Influenza A NOT DETECTED NOT DETECTED Final   Influenza B NOT DETECTED NOT DETECTED Final  Parainfluenza Virus 1 NOT DETECTED NOT DETECTED Final   Parainfluenza Virus 2 NOT DETECTED NOT DETECTED Final   Parainfluenza Virus 3 NOT DETECTED NOT DETECTED Final   Parainfluenza Virus 4 NOT DETECTED NOT DETECTED Final   Respiratory Syncytial Virus NOT DETECTED NOT DETECTED Final   Bordetella pertussis NOT DETECTED NOT DETECTED Final   Chlamydophila pneumoniae NOT DETECTED NOT DETECTED Final   Mycoplasma pneumoniae NOT DETECTED NOT DETECTED Final    Comment: Performed at [facility], [place] [address], [place], [place] [address]  Blood culture (routine x 2)     Status: None (Preliminary result)   Collection Time: 12/21/19 12:24 AM   Specimen: BLOOD  Result Value Ref Range Status   Specimen Description BLOOD RIGHT HAND  Final   Special Requests   Final    BOTTLES DRAWN AEROBIC ONLY Blood Culture results may not be optimal due to an inadequate volume of blood received in culture bottles   Culture  Setup Time   Final    AEROBIC BOTTLE ONLY GRAM POSITIVE COCCI CRITICAL VALUE NOTED.  VALUE IS CONSISTENT WITH PREVIOUSLY REPORTED AND CALLED VALUE.    Culture   Final    TOO YOUNG TO READ Performed at [facility], [place] [address] [place], [place] [address]    Report Status PENDING  Incomplete  Culture, blood (routine x 2)     Status: None (Preliminary result)   Collection Time: 12/22/19  7:32 AM   Specimen: BLOOD RIGHT HAND  Result Value Ref Range Status   Specimen Description BLOOD RIGHT HAND  Final   Special Requests   Final    BOTTLES DRAWN AEROBIC ONLY Blood Culture results may not be optimal due to an inadequate volume of blood received in culture bottles   Culture   Final    NO GROWTH 1 DAY Performed at [facility], [place] [address], [place], [place] [address]    Report Status PENDING   Incomplete  Culture, blood (routine x 2)     Status: None (Preliminary result)   Collection Time: 12/22/19  7:50 AM   Specimen: BLOOD LEFT HAND  Result Value Ref Range Status   Specimen Description BLOOD LEFT HAND  Final   Special Requests   Final    BOTTLES DRAWN AEROBIC ONLY Blood Culture results may not be optimal due to an inadequate volume of blood received in culture bottles   Culture   Final    NO GROWTH 1 DAY Performed at [facility], [place] [address] [place], [place] [address]    Report Status PENDING  Incomplete     Janene Madeira, MSN, NP-C [facility] Infectious Disease [email or web address] Pager: [phone] Office: [phone] [place]: [phone]

## 2019-12-23 NOTE — Procedures (Signed)
  Procedure: R femoral HD catheter veno shows R IJ/EJ occlusion; R subclav vein not visible on [place]; chronic L innom vein occlusion EBL:   minimal Complications:  none immediate  See full dictation in [employer].  Dillard Cannon MD Main # [phone] Pager  [phone]

## 2019-12-24 ENCOUNTER — Inpatient Hospital Stay (HOSPITAL_COMMUNITY): Payer: Medicare Other

## 2019-12-24 DIAGNOSIS — I76 Septic arterial embolism: Secondary | ICD-10-CM

## 2019-12-24 DIAGNOSIS — I748 Embolism and thrombosis of other arteries: Secondary | ICD-10-CM

## 2019-12-24 LAB — GLUCOSE, CAPILLARY
Glucose-Capillary: 124 mg/dL — ABNORMAL HIGH (ref 70–99)
Glucose-Capillary: 31 mg/dL — CL (ref 70–99)
Glucose-Capillary: 32 mg/dL — CL (ref 70–99)
Glucose-Capillary: 39 mg/dL — CL (ref 70–99)
Glucose-Capillary: 61 mg/dL — ABNORMAL LOW (ref 70–99)
Glucose-Capillary: 64 mg/dL — ABNORMAL LOW (ref 70–99)
Glucose-Capillary: 78 mg/dL (ref 70–99)
Glucose-Capillary: 91 mg/dL (ref 70–99)

## 2019-12-24 LAB — COMPREHENSIVE METABOLIC PANEL
ALT: 61 U/L — ABNORMAL HIGH (ref 0–44)
AST: 152 U/L — ABNORMAL HIGH (ref 15–41)
Albumin: 2.4 g/dL — ABNORMAL LOW (ref 3.5–5.0)
Alkaline Phosphatase: 89 U/L (ref 38–126)
Anion gap: 25 — ABNORMAL HIGH (ref 5–15)
BUN: 53 mg/dL — ABNORMAL HIGH (ref 8–23)
CO2: 16 mmol/L — ABNORMAL LOW (ref 22–32)
Calcium: 8.2 mg/dL — ABNORMAL LOW (ref 8.9–10.3)
Chloride: 95 mmol/L — ABNORMAL LOW (ref 98–111)
Creatinine, Ser: 7.06 mg/dL — ABNORMAL HIGH (ref 0.44–1.00)
GFR calc Af Amer: 6 mL/min — ABNORMAL LOW (ref >60)
GFR calc non Af Amer: 5 mL/min — ABNORMAL LOW (ref >60)
Glucose, Bld: 55 mg/dL — ABNORMAL LOW (ref 70–99)
Potassium: 5 mmol/L (ref 3.5–5.1)
Sodium: 136 mmol/L (ref 135–145)
Total Bilirubin: 0.9 mg/dL (ref 0.3–1.2)
Total Protein: 6.5 g/dL (ref 6.5–8.1)

## 2019-12-24 LAB — CBC
HCT: 30.5 % — ABNORMAL LOW (ref 36.0–46.0)
Hemoglobin: 9.8 g/dL — ABNORMAL LOW (ref 12.0–15.0)
MCH: 28.2 pg (ref 26.0–34.0)
MCHC: 32.1 g/dL (ref 30.0–36.0)
MCV: 87.9 fL (ref 80.0–100.0)
Platelets: 118 10*3/uL — ABNORMAL LOW (ref 150–400)
RBC: 3.47 MIL/uL — ABNORMAL LOW (ref 3.87–5.11)
RDW: 18.1 % — ABNORMAL HIGH (ref 11.5–15.5)
WBC: 26.5 10*3/uL — ABNORMAL HIGH (ref 4.0–10.5)
nRBC: 0.3 % — ABNORMAL HIGH (ref 0.0–0.2)

## 2019-12-24 MED ORDER — GLUCAGON HCL RDNA (DIAGNOSTIC) 1 MG IJ SOLR
INTRAMUSCULAR | Status: AC
  Administered 2019-12-24: 1 mg
  Filled 2019-12-24: qty 1

## 2019-12-24 MED ORDER — LIDOCAINE-PRILOCAINE 2.5-2.5 % EX CREA: 1.0000 "application " | TOPICAL_CREAM | CUTANEOUS | Status: DC | PRN

## 2019-12-24 MED ORDER — PENTAFLUOROPROP-TETRAFLUOROETH EX AERO: 1.0000 "application " | INHALATION_SPRAY | CUTANEOUS | Status: DC | PRN

## 2019-12-24 MED ORDER — HEPARIN SODIUM (PORCINE) 1000 UNIT/ML DIALYSIS: 1000.0000 [IU] | INTRAMUSCULAR | Status: DC | PRN

## 2019-12-24 MED ORDER — DEXTROSE 5 % IV SOLN: INTRAVENOUS | Status: DC

## 2019-12-24 MED ORDER — GLUCAGON HCL RDNA (DIAGNOSTIC) 1 MG IJ SOLR
INTRAMUSCULAR | Status: AC
  Filled 2019-12-24: qty 1

## 2019-12-24 MED ORDER — DEXTROSE 50 % IV SOLN
12.5000 g | INTRAVENOUS | Status: AC
  Administered 2019-12-24: 12.5 g via INTRAVENOUS
  Filled 2019-12-24: qty 50

## 2019-12-24 MED ORDER — DEXTROSE 50 % IV SOLN
25.0000 g | INTRAVENOUS | Status: AC
  Administered 2019-12-24: 25 g via INTRAVENOUS

## 2019-12-24 MED ORDER — CHLORHEXIDINE GLUCONATE CLOTH 2 % EX PADS: 6.0000 | MEDICATED_PAD | Freq: Every day | CUTANEOUS | Status: DC

## 2019-12-24 MED ORDER — SODIUM CHLORIDE 0.9 % IV SOLN: 100.0000 mL | INTRAVENOUS | Status: DC | PRN

## 2019-12-24 MED ORDER — ALTEPLASE 2 MG IJ SOLR: 2.0000 mg | Freq: Once | INTRAMUSCULAR | Status: DC | PRN

## 2019-12-24 MED ORDER — LIDOCAINE HCL (PF) 1 % IJ SOLN: 5.0000 mL | INTRAMUSCULAR | Status: DC | PRN

## 2019-12-25 LAB — CULTURE, BLOOD (ROUTINE X 2)

## 2019-12-27 LAB — CULTURE, BLOOD (ROUTINE X 2)
Culture: NO GROWTH
Culture: NO GROWTH

## 2019-12-28 LAB — GLUCOSE, CAPILLARY
Glucose-Capillary: 35 mg/dL — CL (ref 70–99)
Glucose-Capillary: 18 mg/dL — CL (ref 70–99)

## 2020-01-01 NOTE — Progress Notes (Signed)
         [facility] for Infectious Disease  Date of Admission:  12/31/2019      Total days of antibiotics 5  Day 5 vancomycin            ASSESSMENT: [name] is a 86 y.o. 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 MRI results reviewed and indicate septic micro-emboli; discussed with Dr. Avon Gully and per the patient's family 2 weeks ago her mental status was normal and all these changes are new and certainly due to her underlying infection. Not a candidate for CT surgery intervention and recommend medical therapy only.  Repeated cultures are no growth 72h; temp HD cath in place.  Continue vancomycin therapy.   Given her neurologic changes, inability to tolerate HD, worsening liver function and poor p.o. intake overall prognosis is felt to be poor - would recommend palliative medicine consultation to facilitate goals of care discussions .   Bacillus growing in 1/2 cultures likely reflective of skin contaminant from lab draw. Covered by vancomycin, however should this be pathologic.    PLAN: 1. Continue vanocomycin - planned 6 week duration should she survive  2. Would consider palliative medicine team consultation    Principal Problem:   Endocarditis of mitral valve Active Problems:   ESRD needing dialysis ([place])   Sepsis ([place])   Hypotension   Increased anion gap metabolic acidosis   Acute kidney injury superimposed on CKD ([place])   Anemia due to end stage renal disease ([place])   Hyponatremia   Closed L3 vertebral fracture ([place])   Hemodialysis catheter infection ([place])   Bacteremia due to coagulase-negative Staphylococcus   . aspirin  81 mg Oral Daily  . Chlorhexidine Gluconate Cloth  6 each Topical Q0600  . Chlorhexidine Gluconate Cloth  6 each Topical Q0600  . cloNIDine  0.1 mg Oral Daily  . darbepoetin (ARANESP) injection - DIALYSIS  150 mcg Intravenous Q  Wed-HD  . doxercalciferol  1 mcg Intravenous Q M,W,F-HD  . feeding supplement (NEPRO CARB STEADY)  237 mL Oral BID BM  . heparin  5,000 Units Subcutaneous Q8H  . levothyroxine  25 mcg Oral Q0600  . multivitamin  1 tablet Oral QHS  . sevelamer carbonate  800 mg Oral TID WC    SUBJECTIVE: Patient was not examined as she was off the floor for MRI    Allergies  Allergen Reactions  . Ace Inhibitors Other (See Comments)    Reaction unknown    OBJECTIVE: Vitals:   2020/01/08 0549 01-08-20 0600 01/08/2020 0625 01-08-20 0700  BP:  (!) 94/31  (!) 101/54  Pulse:      Resp: 20  20   Temp:   (!) 96.6 F (35.9 C)   TempSrc:   Rectal   SpO2:      Weight:      Height:       Body mass index is 25.39 kg/m.    Janene Madeira, MSN, NP-C [facility] Infect[facility] Disease [email or web address] Pager: [phone] Office: [phone] [place]: [phone]

## 2020-01-01 NOTE — Progress Notes (Addendum)
PROGRESS NOTE    [name]  [id number] DOB: 1934-10-25 DOA: 12/13/2019 PCP: Dorothyann Peng, NP     Brief Narrative:  [name] is a 86 y.o. female with medical history significant of  ESRD on HD MWF, PAD s/p bypass of left LE, hx of mesenteric ischemia, hypertension, COPD, Asthma, Type 2 diabetes who presents with concerns of dehydration. Patient has had decrease appetite for the past 2-3 days and daughter thought she was dehydrated and brought her to ED. She reportedly missed dialysis on Monday and Wednesday but had it on Friday. She was febrile with temperature up to 100.7 and hypotensive down to 66/26 with improved to 155/85 with 1L of LR. CT abdomen showed acute fracture involving the right lateral aspect of the L3 vertebral body, superior endplate and osteophyte complex. No acute intra-abdominal or pelvic findings. CXR showed no acute abnormalities. Negative pelvic and right hip X-ray.  Blood culture was positive for coag negative staph.  Patient has had some issues with her right IJ hemodialysis catheter.  After discussion with nephrology, decision was made to remove her dialysis catheter and give her a line holiday while treating for bacteremia.  Subjective: No acute issues or events overnight, patient continues to decline in mental status, daughter at bedside this morning somewhat tearful about patient's ongoing worsening condition.  New systems markedly limited given patient's mental status.  Assessment & Plan:   Principal Problem:   Endocarditis of mitral valve Active Problems:   ESRD needing dialysis (HCC)   Sepsis ([place])   Hypotension   Increased anion gap metabolic acidosis   Acute kidney injury superimposed on CKD ([place])   Anemia due to end stage renal disease (HCC)   Hyponatremia   Closed L3 vertebral fracture (HCC)   Hemodialysis catheter infection ([place])   Bacteremia due to coagulase-negative Staphylococcus   Severe sepsis secondary to coag negative staph  bacteremia with noted endocarditis, likely POA -Blood culture 12/20 with staph lugdunensis and gram variable rod, pending final report -Hemodialysis catheter was removed on 12/21 -Worsening leukocytosis, afebrile for 48 hours -Repeat blood cultures - pending -Echo remarkable for mitral vegetation -MRI head to rule out thrombotic/septic emboli given mental status -CT A/P: negative for intra-abd findings -ID following: continue Vancomycin and cefepime  -TCTS sidelined in regards to valvular vegetation - no acute indications - only possible remedy would be mitral valve replacement and patient is a poor candidate at this time  Acute metabolic encephalopathy, worsening -Baseline mental status: Alert/oriented to at least person/situation - able to inform daughter previously about medical history and day to day activities -Worsening mental status over the past 2 weeks more acutely (maybe 3 months of generalized worsening mental status but timeline is questionable) -CT of the head remarkable for calcification, cannot rule out septic emboli at this point  -Addendum this afternoon, MRI reported positive for what appeared to be multiple small acute infarcts consistent with septic emboli.  Hold off on neurology consult given treatment remains IV antibiotics.  ESRD on hemodialysis -Per nephrology. Hemodialysis catheter was removed on 12/21 to give line holiday  -Patient continues to have difficulty with hemodialysis due to hypotension via temporary dialysis catheter.  Defer to nephrology for further evaluation and treatment.  Hyperkalemia, improving -Likely 2/2 missed dialysis - defer to nephrology. Patient may be able to wait until dialysis as tentatively planned tomorrow - if unable to start dialysis may benefit from kayexalate/similar  Severe persistent hypoglycemia  -Likely in the setting of poor p.o. intake -Nephrology  agreed for low-dose D5 at 10 to 15 cc/h, up to 20 if necessary.  If patient  remains hypoglycemic on D5 would consider D10 as well.  Acute L3 compression fracture -Status post fall -CT A/P: Acute fracture involving the right lateral aspect of the L3 vertebral body, superior endplate and osteophyte complex. Multilevel spondylosis and ankylosis as described. -PT OT    Hypothyroidism -Continue Synthroid  Essential hypertension -Resume clonidine.  Continue to hold amlodipine due to hypotension on admission  Hyponatremia, resolved -Stable  End-of-life care, comfort measures discussion -Lengthy discussion as below with daughter at bedside today about patient's worsening condition, suspect patient will likely continue to worsen over the next 72 hours.  At this time continue aggressive medical therapy, patient is DNR which was confirmed at bedside today.  Certainly if patient continues to become unstable would have more blunt conversation with family, patient's grandson Octavia Bruckner will be available on the 25th, he may be more amenable to hospice or comfort care.  DVT prophylaxis: Subcutaneous heparin Code Status: DNR  Family Communication: Daughter at bedside, tearful about patient's worsening condition, we discussed that the patient likely had infection for a long time and as she was not treated quickly there is likely very little left to do.  At this time we will continue antibiotics and dialysis as tolerated, ultimately this point we discussed that she is very high risk for decompensation over the weekend and may need to discuss palliative care and hospice.  Family somewhat in denial given patient was "normal" just 2 weeks ago  Disposition Plan: Pending clinical status - patient remains high risk for decompensation. Given worsening mental status per report and advanced age if patient continues to decline would consider palliative care/hospice discussion with family.   Consultants:   Nephrology  IR  TCTS  Procedures:   Removal of tunneled right IJ dialysis catheter  12/21  Antimicrobials:  Anti-infectives (From admission, onward)   Start     Dose/Rate Route Frequency Ordered Stop   12/23/19 2103  vancomycin (VANCOCIN) 500-5 MG/100ML-% IVPB    Note to Pharmacy: Cherylann Banas   : cabinet override      12/23/19 2103 12/23/19 2119   12/21/19 1900  ceFEPIme (MAXIPIME) 1 g in sodium chloride 0.9 % 100 mL IVPB  Status:  Discontinued     1 g 200 mL/hr over 30 Minutes Intravenous Every 24 hours 12/19/2019 1818 12/23/19 0901   12/21/19 1200  vancomycin (VANCOREADY) IVPB 500 mg/100 mL     500 mg 100 mL/hr over 60 Minutes Intravenous Every M-W-F (Hemodialysis) 12/21/19 1003     12/21/19 0000  vancomycin variable dose per unstable renal function (pharmacist dosing)  Status:  Discontinued      Does not apply See admin instructions 12/23/2019 1818 12/21/19 1003   12/30/2019 1830  vancomycin (VANCOREADY) IVPB 1250 mg/250 mL     1,250 mg 166.7 mL/hr over 90 Minutes Intravenous  Once 12/19/2019 1818 12/05/2019 2143   12/18/2019 1830  ceFEPIme (MAXIPIME) 2 g in sodium chloride 0.9 % 100 mL IVPB     2 g 200 mL/hr over 30 Minutes Intravenous  Once 12/09/2019 1818 12/19/2019 2314       Objective: Vitals:   01-18-20 0528 01/18/20 0549 2020-01-18 0600 18-Jan-2020 0625  BP: (!) 92/53  (!) 94/31   Pulse:      Resp:  20  20  Temp:    (!) 96.6 F (35.9 C)  TempSrc:    Rectal  SpO2: 98%  Weight:      Height:        Intake/Output Summary (Last 24 hours) at 01-04-20 0637 Last data filed at 12/23/2019 2332 Gross per 24 hour  Intake --  Output -400 ml  Net 400 ml   Filed Weights   12/23/19 1951 12/23/19 2332 January 04, 2020 0041  Weight: 55.6 kg 56 kg 55.1 kg    Examination:  General:  Pleasantly resting in bed, No acute distress.  Somnolent, poorly arousable, does not follow commands HEENT:  Normocephalic atraumatic.  Sclerae nonicteric, noninjected.  Extraocular movements intact bilaterally. Neck:  Without mass or deformity.  Trachea is midline. Lungs: Diminished breath sounds  bilaterally without overt wheezes rales or rhonchi. Heart:  Regular rate and rhythm.  Without murmurs, rubs, or gallops. Abdomen:  Soft, nontender, nondistended.  Without guarding or rebound. Extremities: Without cyanosis, clubbing, edema, right femoral hemodialysis catheter noted Vascular:  Dorsalis pedis and posterior tibial pulses palpable bilaterally. Skin:  Warm and dry, no erythema, no ulcerations.  Data Reviewed: I have personally reviewed following labs and imaging studies  CBC: Recent Labs  Lab 12/21/2019 1423 12/21/19 0430 12/22/19 0732 12/23/19 0432  WBC 20.6*  --  24.7* 28.5*  NEUTROABS 17.6*  --   --   --   HGB 10.3*  --  10.1* 10.8*  HCT 32.7* 30.2* 31.2* 33.3*  MCV 89.6  --  86.7 87.6  PLT 191  --  129* [id number]*   Basic Metabolic Panel: Recent Labs  Lab 12/23/2019 1340 12/21/19 0024 12/22/19 0732 12/23/19 1235  NA 128* 127* 129* 129*  K 4.0 4.0 4.6 6.4*  CL 88* 86* 87* 86*  CO2 21* 18* 15* 9*  GLUCOSE 158* 130* 169* 149*  BUN 57* 57* 81* 109*  CREATININE 8.43* 8.38* 10.54* 12.11*  CALCIUM 8.0* 7.6* 8.4* 8.1*  MG  --   --  2.3  --    GFR: Estimated Creatinine Clearance: 2.6 mL/min (A) (by C-G formula based on SCr of 12.11 mg/dL (H)). Liver Function Tests: Recent Labs  Lab 12/19/2019 1340 12/21/19 0024  AST 28 23  ALT 13 10  ALKPHOS 90 78  BILITOT 0.4 0.8  PROT 7.4 6.2*  ALBUMIN 2.7* 1.9*   No results for input(s): LIPASE, AMYLASE in the last 168 hours. No results for input(s): AMMONIA in the last 168 hours. Coagulation Profile: No results for input(s): INR, PROTIME in the last 168 hours. Cardiac Enzymes: No results for input(s): CKTOTAL, CKMB, CKMBINDEX, TROPONINI in the last 168 hours. BNP (last 3 results) No results for input(s): PROBNP in the last 8760 hours. HbA1C: No results for input(s): HGBA1C in the last 72 hours. CBG: Recent Labs  Lab 12/23/19 0608 12/23/19 1200 12/23/19 1616 Jan 04, 2020 0019 2020/01/04 0127  GLUCAP 167* 154* 207* 61*  124*   Lipid Profile: No results for input(s): CHOL, HDL, LDLCALC, TRIG, CHOLHDL, LDLDIRECT in the last 72 hours. Thyroid Function Tests: Recent Labs    12/23/19 1235  TSH 3.381   Anemia Panel: No results for input(s): VITAMINB12, FOLATE, FERRITIN, TIBC, IRON, RETICCTPCT in the last 72 hours. Sepsis Labs: Recent Labs  Lab 12/21/19 0024  LATICACIDVEN 1.8    Recent Results (from the past 240 hour(s))  SARS CORONAVIRUS 2 (TAT 6-24 HRS) Nasopharyngeal Nasopharyngeal Swab     Status: None   Collection Time: 12/04/2019 12:17 PM   Specimen: Nasopharyngeal Swab  Result Value Ref Range Status   SARS Coronavirus 2 NEGATIVE NEGATIVE Final    Comment: (NOTE) SARS-CoV-2 target nucleic acids  are NOT DETECTED. The SARS-CoV-2 RNA is generally detectable in upper and lower respiratory specimens during the acute phase of infection. Negative results do not preclude SARS-CoV-2 infection, do not rule out co-infections with other pathogens, and should not be used as the sole basis for treatment or other patient management decisions. Negative results must be combined with clinical observations, patient history, and epidemiological information. The expected result is Negative. Fact Sheet for Patients: SugarRoll.be Fact Sheet for Healthcare Providers: https://www.woods-mathews.com/ This test is not yet approved or cleared by the [place] FDA and  has been authorized for detection and/or diagnosis of SARS-CoV-2 by FDA under an Emergency Use Authorization (EUA). This EUA will remain  in effect (meaning this test can be used) for the duration of the COVID-19 declaration under Section 56 4(b)(1) of the Act, 21 U.S.C. section 360bbb-3(b)(1), unless the authorization is terminated or revoked sooner. Performed at [facility], [place] [address], [place], [place] [address]   Blood culture (routine x 2)     Status: Abnormal   Collection Time: 12/19/2019   7:00 PM   Specimen: BLOOD RIGHT ARM  Result Value Ref Range Status   Specimen Description BLOOD RIGHT ARM  Final   Special Requests   Final    BOTTLES DRAWN AEROBIC AND ANAEROBIC Blood Culture results may not be optimal due to an inadequate volume of blood received in culture bottles   Culture  Setup Time (A)  Final    GRAM VARIABLE ROD AEROBIC BOTTLE ONLY CRITICAL RESULT CALLED TO, READ BACK BY AND VERIFIED WITH: Sharen Heck PharmD 10:10 12/21/19 ([email or web address]) GRAM POSITIVE COCCI ANAEROBIC BOTTLE ONLY CRITICAL RESULT CALLED TO, READ BACK BY AND VERIFIED WITH: Milford Cage [id number] Performed at [facility], [place] [address] [place], [place] [address]    Culture BACILLUS SPECIES STAPHYLOCOCCUS LUGDUNENSIS  (A)  Final   Report Status 12/23/2019 FINAL  Final   Organism ID, Bacteria STAPHYLOCOCCUS LUGDUNENSIS  Final      Susceptibility   Staphylococcus lugdunensis - MIC*    CIPROFLOXACIN <=0.5 SENSITIVE Sensitive     ERYTHROMYCIN >=8 RESISTANT Resistant     GENTAMICIN <=0.5 SENSITIVE Sensitive     OXACILLIN 0.5 SENSITIVE Sensitive     TETRACYCLINE <=1 SENSITIVE Sensitive     VANCOMYCIN 1 SENSITIVE Sensitive     TRIMETH/SULFA <=10 SENSITIVE Sensitive     CLINDAMYCIN >=8 RESISTANT Resistant     RIFAMPIN <=0.5 SENSITIVE Sensitive     Inducible Clindamycin NEGATIVE Sensitive     * STAPHYLOCOCCUS LUGDUNENSIS  Blood Culture ID Panel (Reflexed)     Status: Abnormal   Collection Time: 12/21/2019  7:00 PM  Result Value Ref Range Status   Enterococcus species NOT DETECTED NOT DETECTED Final   Listeria monocytogenes NOT DETECTED NOT DETECTED Final   Staphylococcus species DETECTED (A) NOT DETECTED Final    Comment: Methicillin (oxacillin) susceptible coagulase negative staphylococcus. Possible blood culture contaminant (unless isolated from more than one blood culture draw or clinical case suggests pathogenicity). No antibiotic treatment is indicated for blood  culture  contaminants. CRITICAL RESULT CALLED TO, READ BACK BY AND VERIFIED WITH: Sharen Heck PharmD 10:10 12/21/19 ([email or web address])    Staphylococcus aureus (BCID) NOT DETECTED NOT DETECTED Final   Methicillin resistance NOT DETECTED NOT DETECTED Final   Streptococcus species NOT DETECTED NOT DETECTED Final   Streptococcus agalactiae NOT DETECTED NOT DETECTED Final   Streptococcus pneumoniae NOT DETECTED NOT DETECTED Final   Streptococcus pyogenes NOT DETECTED NOT DETECTED  Final   Acinetobacter baumannii NOT DETECTED NOT DETECTED Final   Enterobacteriaceae species NOT DETECTED NOT DETECTED Final   Enterobacter cloacae complex NOT DETECTED NOT DETECTED Final   Escherichia coli NOT DETECTED NOT DETECTED Final   Klebsiella oxytoca NOT DETECTED NOT DETECTED Final   Klebsiella pneumoniae NOT DETECTED NOT DETECTED Final   Proteus species NOT DETECTED NOT DETECTED Final   Serratia marcescens NOT DETECTED NOT DETECTED Final   Haemophilus influenzae NOT DETECTED NOT DETECTED Final   Neisseria meningitidis NOT DETECTED NOT DETECTED Final   Pseudomonas aeruginosa NOT DETECTED NOT DETECTED Final   Candida albicans NOT DETECTED NOT DETECTED Final   Candida glabrata NOT DETECTED NOT DETECTED Final   Candida krusei NOT DETECTED NOT DETECTED Final   Candida parapsilosis NOT DETECTED NOT DETECTED Final   Candida tropicalis NOT DETECTED NOT DETECTED Final    Comment: Performed at [facility], [place] [address] [place], [place] [address]  Respiratory Panel by PCR     Status: None   Collection Time: 12/23/2019 10:07 PM   Specimen: Nasopharyngeal Swab; Respiratory  Result Value Ref Range Status   Adenovirus NOT DETECTED NOT DETECTED Final   Coronavirus 229E NOT DETECTED NOT DETECTED Final    Comment: (NOTE) The Coronavirus on the Respiratory Panel, DOES NOT test for the novel  Coronavirus (2019 nCoV)    Coronavirus HKU1 NOT DETECTED NOT DETECTED Final   Coronavirus NL63 NOT DETECTED NOT DETECTED Final    Coronavirus OC43 NOT DETECTED NOT DETECTED Final   Metapneumovirus NOT DETECTED NOT DETECTED Final   Rhinovirus / Enterovirus NOT DETECTED NOT DETECTED Final   Influenza A NOT DETECTED NOT DETECTED Final   Influenza B NOT DETECTED NOT DETECTED Final   Parainfluenza Virus 1 NOT DETECTED NOT DETECTED Final   Parainfluenza Virus 2 NOT DETECTED NOT DETECTED Final   Parainfluenza Virus 3 NOT DETECTED NOT DETECTED Final   Parainfluenza Virus 4 NOT DETECTED NOT DETECTED Final   Respiratory Syncytial Virus NOT DETECTED NOT DETECTED Final   Bordetella pertussis NOT DETECTED NOT DETECTED Final   Chlamydophila pneumoniae NOT DETECTED NOT DETECTED Final   Mycoplasma pneumoniae NOT DETECTED NOT DETECTED Final    Comment: Performed at [facility], [place]. [address] [place], [place] [address]  Blood culture (routine x 2)     Status: None (Preliminary result)   Collection Time: 12/21/19 12:24 AM   Specimen: BLOOD  Result Value Ref Range Status   Specimen Description BLOOD RIGHT HAND  Final   Special Requests   Final    BOTTLES DRAWN AEROBIC ONLY Blood Culture results may not be optimal due to an inadequate volume of blood received in culture bottles   Culture  Setup Time   Final    AEROBIC BOTTLE ONLY GRAM POSITIVE COCCI CRITICAL VALUE NOTED.  VALUE IS CONSISTENT WITH PREVIOUSLY REPORTED AND CALLED VALUE.    Culture   Final    TOO YOUNG TO READ Performed at [facility], [place] [address] [place], [place] [address]    Report Status PENDING  Incomplete  Culture, blood (routine x 2)     Status: None (Preliminary result)   Collection Time: 12/22/19  7:32 AM   Specimen: BLOOD RIGHT HAND  Result Value Ref Range Status   Specimen Description BLOOD RIGHT HAND  Final   Special Requests   Final    BOTTLES DRAWN AEROBIC ONLY Blood Culture results may not be optimal due to an inadequate volume of blood received  in culture bottles   Culture   Final    NO GROWTH 1 DAY Performed at [facility], [place] [address], [place], [place] [address]    Report Status PENDING  Incomplete  Culture, blood (routine x 2)     Status: None (Preliminary result)   Collection Time: 12/22/19  7:50 AM   Specimen: BLOOD LEFT HAND  Result Value Ref Range Status   Specimen Description BLOOD LEFT HAND  Final   Special Requests   Final    BOTTLES DRAWN AEROBIC ONLY Blood Culture results may not be optimal due to an inadequate volume of blood received in culture bottles   Culture   Final    NO GROWTH 1 DAY Performed at [facility], [place] [address], [place], [place] [address]    Report Status PENDING  Incomplete      Radiology Studies: CT HEAD WO CONTRAST  Result Date: 12/23/2019 CLINICAL DATA:  Fall earlier this month, now with tremors EXAM: CT HEAD WITHOUT CONTRAST TECHNIQUE: Contiguous axial images were obtained from the base of the skull through the vertex without intravenous contrast. COMPARISON:  CT head 10/10/2019 FINDINGS: Brain: There is a new punctate focus of calcification along the cortex of the anterior right insula, not present on comparison imaging is recent is 12/10/2019. The presence of this finding is worrisome for a possible distal branch calcified thrombus. However, there is no CT evidence of acute infarction. No acute intracranial hemorrhage, hydrocephalus, extra-axial collection or mass lesion/mass effect is seen either. Symmetric prominence of the ventricles, cisterns and sulci compatible with parenchymal volume loss. Patchy areas of white matter hypoattenuation are most compatible with chronic microvascular angiopathy. Vascular: Punctate calcification along the right anterior insular, possibly within the vascular branch. No other hyperdense vessels. Atherosclerotic calcification of the carotid siphons and intradural vertebral arteries. Skull: No calvarial fracture or suspicious osseous lesion. No scalp swelling or hematoma. Sinuses/Orbits: A apical and paranasal sinuses and  mastoid air cells are predominantly clear. Orbital structures are unremarkable aside from prior lens extractions. Other: None IMPRESSION: New punctate calcification along the cortex of the right insula, new since comparison. Appearance is nonspecific, could feasibly reflect some calcified intravascular thrombus versus dural calcification developing in the interim. No acute CT evidence of infarct is seen. If there is persisting concern for ischemia, MRI is more sensitive and specific. These results will be called to the ordering clinician or representative by the Radiologist Assistant, and communication documented in the PACS or zVision Dashboard. Electronically Signed   By: Lovena Le M.D.   On: 12/23/2019 04:11   ECHOCARDIOGRAM COMPLETE  Result Date: 12/22/2019   ECHOCARDIOGRAM REPORT   Patient Name:   CEDELLA PASCARELLA Date of Exam: 12/22/2019 Medical Rec #:  [id number]      Height:       58.0 in Accession #:    [id number]     Weight:       122.1 lb Date of Birth:  08/30/1934      BSA:          1.48 m Patient Age:    35 years       BP:           117/60 mmHg Patient Gender: F              HR:           118 bpm. Exam Location:  Inpatient Procedure: 2D Echo Indications:     Bacteremia 790.7 /  R78.81  History:         Patient has prior history of Echocardiogram examinations, most                  recent 02/27/2018. Mitral Valve Disease,                  Arrythmias:Palpitations, Signs/Symptoms:Chest Pain and Dyspnea;                  Risk Factors:Diabetes and Hypertension. ESRD (end stage renal                  disease)                  Sepsis.  Sonographer:     Vikki Ports Turrentine Referring Phys:  [id number] [place] CHOI Diagnosing Phys: Sanda Klein MD IMPRESSIONS  1. Left ventricular ejection fraction, by visual estimation, is 70 to 75%. The left ventricle has hyperdynamic function. There is no left ventricular hypertrophy.  2. The left ventricle has no regional wall motion abnormalities.  3. Global right ventricle  has mildly reduced systolic function.The right ventricular size is normal. No increase in right ventricular wall thickness.  4. Left atrial size was mildly dilated.  5. Right atrial size was normal.  6. Large vegetation on the mitral valve.  7. Moderate calcification of the mitral valve leaflet(s).  8. Moderate mitral annular calcification.  9. Moderate thickening of the mitral valve leaflet(s). 10. The mitral valve is degenerative. Moderate to severe mitral valve regurgitation. 11. The tricuspid valve is grossly normal. Tricuspid valve regurgitation moderate-severe. 12. The aortic valve is tricuspid. Aortic valve regurgitation is trivial. Mild aortic valve stenosis. 13. The pulmonic valve was grossly normal. Pulmonic valve regurgitation is not visualized. 14. Normal pulmonary artery systolic pressure. 15. Compared to 02/27/2018 there is a new mass attached to the posterior mitral leaflet a(pobably a vegetation given the clinical scenario) and there is new mitral insufficiency, at least moderate in severity. 16. Prior images reviewed side by side. FINDINGS  Left Ventricle: Left ventricular ejection fraction, by visual estimation, is 70 to 75%. The left ventricle has hyperdynamic function. The left ventricle has no regional wall motion abnormalities. The left ventricular internal cavity size was the left ventricle is normal in size. There is no left ventricular hypertrophy. Right Ventricle: The right ventricular size is normal. No increase in right ventricular wall thickness. Global RV systolic function is has mildly reduced systolic function. The tricuspid regurgitant velocity is 2.02 m/s, and with an assumed right atrial pressure of 8 mmHg, the estimated right ventricular systolic pressure is normal at 24.3 mmHg. Left Atrium: Left atrial size was mildly dilated. Right Atrium: Right atrial size was normal in size Pericardium: There is no evidence of pericardial effusion. Mitral Valve: The mitral valve is  degenerative in appearance. There is moderate thickening of the mitral valve leaflet(s). There is moderate calcification of the mitral valve leaflet(s). Moderate mitral annular calcification. A large vegetation is seen on the posterior mitral leaflet. The MV vegetation measures 12 mm x 10 mm. Moderate to severe mitral valve regurgitation. MV peak gradient, 16.8 mmHg. Tricuspid Valve: The tricuspid valve is grossly normal. Tricuspid valve regurgitation moderate-severe. Aortic Valve: The aortic valve is tricuspid. . There is moderate thickening and moderate calcification of the aortic valve. Aortic valve regurgitation is trivial. Mild aortic stenosis is present. There is moderate thickening of the aortic valve. There is  moderate calcification of the aortic valve. Aortic valve mean gradient measures  7.0 mmHg. Aortic valve peak gradient measures 12.5 mmHg. Aortic valve area, by VTI measures 1.51 cm. Pulmonic Valve: The pulmonic valve was grossly normal. Pulmonic valve regurgitation is not visualized. Pulmonic regurgitation is not visualized. Aorta: The aortic root is normal in size and structure. IAS/Shunts: No atrial level shunt detected by color flow Doppler.  LEFT VENTRICLE PLAX 2D LVIDd:         3.50 cm  Diastology LVIDs:         1.80 cm  LV e' lateral: 5.76 cm/s LV PW:         1.00 cm LV IVS:        1.00 cm LVOT diam:     1.94 cm LV SV:         41 ml LV SV Index:   27.05 LVOT Area:     2.96 cm  RIGHT VENTRICLE RV S prime:     10.90 cm/s TAPSE (M-mode): 0.7 cm LEFT ATRIUM             Index       RIGHT ATRIUM           Index LA diam:        4.10 cm 2.78 cm/m  RA Area:     10.90 cm LA Vol (A2C):   48.5 ml 32.84 ml/m RA Volume:   21.80 ml  14.76 ml/m LA Vol (A4C):   51.9 ml 35.14 ml/m LA Biplane Vol: 50.3 ml 34.06 ml/m  AORTIC VALVE AV Area (Vmax):    1.41 cm AV Area (Vmean):   1.27 cm AV Area (VTI):     1.51 cm AV Vmax:           177.00 cm/s AV Vmean:          124.000 cm/s AV VTI:            0.235 m AV  Peak Grad:      12.5 mmHg AV Mean Grad:      7.0 mmHg LVOT Vmax:         84.50 cm/s LVOT Vmean:        53.300 cm/s LVOT VTI:          0.120 m LVOT/AV VTI ratio: 0.51  AORTA Ao Root diam: 2.00 cm MITRAL VALVE             TRICUSPID VALVE MV Peak grad: 16.8 mmHg  TR Peak grad:   16.3 mmHg MV Mean grad: 8.0 mmHg   TR Vmax:        202.00 cm/s MV Vmax:      2.05 m/s MV Vmean:     129.0 cm/s SHUNTS MV VTI:       0.29 m     Systemic VTI:  0.12 m                          Systemic Diam: 1.94 cm  Dani Gobble Croitoru MD Electronically signed by Sanda Klein MD Signature Date/Time: 12/22/2019/4:08:00 PM    Final (Updated)       Scheduled Meds: . aspirin  81 mg Oral Daily  . Chlorhexidine Gluconate Cloth  6 each Topical Q0600  . cloNIDine  0.1 mg Oral Daily  . darbepoetin (ARANESP) injection - DIALYSIS  150 mcg Intravenous Q Wed-HD  . doxercalciferol  1 mcg Intravenous Q M,W,F-HD  . feeding supplement (NEPRO CARB STEADY)  237 mL Oral BID BM  . heparin  5,000 Units Subcutaneous Q8H  .  levothyroxine  25 mcg Oral Q0600  . multivitamin  1 tablet Oral QHS  . sevelamer carbonate  800 mg Oral TID WC   Continuous Infusions: . sodium chloride Stopped (12/22/19 2107)  . sodium chloride    . sodium chloride    . vancomycin       LOS: 4 days   Time spent: 45 minutes   Little Ishikawa, DO [facility] 01/15/20, 6:37 AM   Available via Epic secure chat 7am-7pm After these hours, please refer to coverage provider listed on [email or web address]

## 2020-01-01 NOTE — Progress Notes (Signed)
OT Cancellation Note  Patient Details Name: [name] MRN: [id number] DOB: May 13, 1934   Cancelled Treatment:     Attempt to see patient x2. First attempt patient calling out/moaning, second attempt patient medicated for pain and drowsy difficult to arouse and non communicative. Will re-attempt as schedule allows.  Delbert Phenix OT OT office: [phone]   Rosemary Holms 03-Jan-2020, 11:21 AM

## 2020-01-01 NOTE — Progress Notes (Signed)
Post Mortem   RN was called to room via charge nurse d/t a central telemetry call that the patient's heart rate was 23. When this nurse entered the room, the telemetry monitor read 0 and pt was not breathing. Charge nurse was in room preparing to listen for any heart sounds while I paged the Attending. Pt was pronounced by 2 RN's. CDS were called by this RN, along with next of kin.  All belongings will be sent with patient.

## 2020-01-01 NOTE — Care Management Important Message (Signed)
Important Message  Patient Details  Name: [name] MRN: [id number] Date of Birth: 1934/01/30   Medicare Important Message Given:  Yes     Dvontae Ruan 2019-12-25, 11:22 AM

## 2020-01-01 NOTE — Progress Notes (Signed)
SLP Cancellation Note  Patient Details Name: ARLYS TUAN MRN: [id number] DOB: 1934/08/28   Cancelled treatment:       Reason Eval/Treat Not Completed: Patient's level of consciousness. Pt given sedation for MRI today. Too lethargic. Will return as able   Clide Remmers, Katherene Ponto 31-Dec-2019, 12:13 PM

## 2020-01-01 NOTE — Progress Notes (Signed)
[facility] ROUNDING NOTE   Subjective:   This is an 86 year old lady with a history of end-stage renal disease Monday Wednesday Friday dialysis.  She also has peripheral artery disease status post bypass graft of her left lower extremity.  She has a history of mesenteric ischemia hypertension COPD asthma type 2 diabetes.  She is brought in with decreased appetite and altered mental status temperature of 100.7 and hypotensive.  CT of the abdomen showed acute fracture involving the right lateral aspect of the L3 vertebral body.    Blood cultures were positive for staph lugdunensis which is highly destructive to the heart valves.  TEE revealed mitral valve endocarditis with large vegetation.  Her dialysis catheter was removed 12/21/2019.  Does not appear that she has had dialysis since 12/19/2019.  Underwent placement of temporary dialysis catheter 12/23/2019 appreciate assistance from Dr. Vernard Gambles.  Patient received dialysis treatment but terminated early due to hypotension.  There did not appear to be any fluid removed 12/23/2019  Blood pressure 120/35 pulse 71 temperature 96.6 O2 sats 97% room air  Sodium 136 potassium 5.0 chloride 95 CO2 16 BUN 53 creatinine 7 glucose 55 calcium 8.2 albumin 2.4 AST 152 ALT 61 WBC 26.5 hemoglobin 9.8 platelets 118  Next dialysis treatment will be scheduled 12/26/2019.  Aspirin 81 mg daily, darbepoetin 150 mcg every Wednesday, Hectorol 1 mcg Monday Wednesday Friday, levothyroxine 25 mcg daily, Renvela 800 mg with meals,  IV cefepime 1 g every 24 hours, vancomycin 500 mg Monday Wednesday Friday     Objective:  Vital signs in last 24 hours:  Temp:  [95 F (35 C)-98.2 F (36.8 C)] 96.6 F (35.9 C) (12/24 0625) Pulse Rate:  [79-131] 81 (12/24 0100) Resp:  [18-25] 20 (12/24 0625) BP: (85-120)/(29-88) 101/54 (12/24 0700) SpO2:  [40 %-100 %] 98 % (12/24 0528) Weight:  [55.1 kg-56 kg] 55.1 kg (12/24 0041)  Weight change:  Filed Weights    12/23/19 1951 12/23/19 2332 12-27-19 0041  Weight: 55.6 kg 56 kg 55.1 kg    Intake/Output: I/O last 3 completed shifts: In: 106.3 [I.V.:6.3; IV Piggyback:100] Out: -400    Intake/Output this shift:  No intake/output data recorded.  General: sitting up in bed NAD Heart: tachy reg rate ~120s Lungs: no rales, dim BS Abdomen: soft NT Extremities: no LE edema Dialysis Access: none Neuro: constant tremulousness - though not a twitchy as yesterday - can give short 1-2 word answers   Basic Metabolic Panel: Recent Labs  Lab 12/19/2019 1340 12/21/19 0024 12/22/19 0732 12/23/19 1235 Dec 27, 2019 0501  NA 128* 127* 129* 129* 136  K 4.0 4.0 4.6 6.4* 5.0  CL 88* 86* 87* 86* 95*  CO2 21* 18* 15* 9* 16*  GLUCOSE 158* 130* 169* 149* 55*  BUN 57* 57* 81* 109* 53*  CREATININE 8.43* 8.38* 10.54* 12.11* 7.06*  CALCIUM 8.0* 7.6* 8.4* 8.1* 8.2*  MG  --   --  2.3  --   --     Liver Function Tests: Recent Labs  Lab 12/23/2019 1340 12/21/19 0024 12/27/19 0501  AST 28 23 152*  ALT 13 10 61*  ALKPHOS 90 78 89  BILITOT 0.4 0.8 0.9  PROT 7.4 6.2* 6.5  ALBUMIN 2.7* 1.9* 2.4*   No results for input(s): LIPASE, AMYLASE in the last 168 hours. No results for input(s): AMMONIA in the last 168 hours.  CBC: Recent Labs  Lab 12/26/2019 1423 12/21/19 0430 12/22/19 0732 12/23/19 0432 12-27-19 0501  WBC 20.6*  --  24.7*  28.5* 26.5*  NEUTROABS 17.6*  --   --   --   --   HGB 10.3*  --  10.1* 10.8* 9.8*  HCT 32.7* 30.2* 31.2* 33.3* 30.5*  MCV 89.6  --  86.7 87.6 87.9  PLT 191  --  129* 112* 118*    Cardiac Enzymes: No results for input(s): CKTOTAL, CKMB, CKMBINDEX, TROPONINI in the last 168 hours.  BNP: Invalid input(s): POCBNP  CBG: Recent Labs  Lab 01/12/2020 0127 2020-01-12 0650 01-12-2020 0652 2020-01-12 0756 01-12-20 0838  GLUCAP 124* 35* 39* 32* 91    Microbiology: Results for orders placed or performed during the hospital encounter of 12/21/2019  SARS CORONAVIRUS 2 (TAT 6-24 HRS)  Nasopharyngeal Nasopharyngeal Swab     Status: None   Collection Time: 12/17/2019 12:17 PM   Specimen: Nasopharyngeal Swab  Result Value Ref Range Status   SARS Coronavirus 2 NEGATIVE NEGATIVE Final    Comment: (NOTE) SARS-CoV-2 target nucleic acids are NOT DETECTED. The SARS-CoV-2 RNA is generally detectable in upper and lower respiratory specimens during the acute phase of infection. Negative results do not preclude SARS-CoV-2 infection, do not rule out co-infections with other pathogens, and should not be used as the sole basis for treatment or other patient management decisions. Negative results must be combined with clinical observations, patient history, and epidemiological information. The expected result is Negative. Fact Sheet for Patients: SugarRoll.be Fact Sheet for Healthcare Providers: https://www.woods-mathews.com/ This test is not yet approved or cleared by the [place] FDA and  has been authorized for detection and/or diagnosis of SARS-CoV-2 by FDA under an Emergency Use Authorization (EUA). This EUA will remain  in effect (meaning this test can be used) for the duration of the COVID-19 declaration under Section 56 4(b)(1) of the Act, 21 U.S.C. section 360bbb-3(b)(1), unless the authorization is terminated or revoked sooner. Performed at [facility], [place] [address] [place], [place] [address]   Blood culture (routine x 2)     Status: Abnormal   Collection Time: 12/26/2019  7:00 PM   Specimen: BLOOD RIGHT ARM  Result Value Ref Range Status   Specimen Description BLOOD RIGHT ARM  Final   Special Requests   Final    BOTTLES DRAWN AEROBIC AND ANAEROBIC Blood Culture results may not be optimal due to an inadequate volume of blood received in culture bottles   Culture  Setup Time (A)  Final    GRAM VARIABLE ROD AEROBIC BOTTLE ONLY CRITICAL RESULT CALLED TO, READ BACK BY AND VERIFIED WITH: Sharen Heck PharmD 10:10  12/21/19 ([email or web address]) GRAM POSITIVE COCCI ANAEROBIC BOTTLE ONLY CRITICAL RESULT CALLED TO, READ BACK BY AND VERIFIED WITH: Milford Cage [id number] Performed at [facility], [place] [address], [place], [place] [address]    Culture BACILLUS SPECIES STAPHYLOCOCCUS LUGDUNENSIS  (A)  Final   Report Status 12/23/2019 FINAL  Final   Organism ID, Bacteria STAPHYLOCOCCUS LUGDUNENSIS  Final      Susceptibility   Staphylococcus lugdunensis - MIC*    CIPROFLOXACIN <=0.5 SENSITIVE Sensitive     ERYTHROMYCIN >=8 RESISTANT Resistant     GENTAMICIN <=0.5 SENSITIVE Sensitive     OXACILLIN 0.5 SENSITIVE Sensitive     TETRACYCLINE <=1 SENSITIVE Sensitive     VANCOMYCIN 1 SENSITIVE Sensitive     TRIMETH/SULFA <=10 SENSITIVE Sensitive     CLINDAMYCIN >=8 RESISTANT Resistant     RIFAMPIN <=0.5 SENSITIVE Sensitive     Inducible Clindamycin NEGATIVE Sensitive     *  STAPHYLOCOCCUS LUGDUNENSIS  Blood Culture ID Panel (Reflexed)     Status: Abnormal   Collection Time: 12/26/2019  7:00 PM  Result Value Ref Range Status   Enterococcus species NOT DETECTED NOT DETECTED Final   Listeria monocytogenes NOT DETECTED NOT DETECTED Final   Staphylococcus species DETECTED (A) NOT DETECTED Final    Comment: Methicillin (oxacillin) susceptible coagulase negative staphylococcus. Possible blood culture contaminant (unless isolated from more than one blood culture draw or clinical case suggests pathogenicity). No antibiotic treatment is indicated for blood  culture contaminants. CRITICAL RESULT CALLED TO, READ BACK BY AND VERIFIED WITH: Sharen Heck PharmD 10:10 12/21/19 ([email or web address])    Staphylococcus aureus (BCID) NOT DETECTED NOT DETECTED Final   Methicillin resistance NOT DETECTED NOT DETECTED Final   Streptococcus species NOT DETECTED NOT DETECTED Final   Streptococcus agalactiae NOT DETECTED NOT DETECTED Final   Streptococcus pneumoniae NOT DETECTED NOT DETECTED Final   Streptococcus pyogenes NOT DETECTED  NOT DETECTED Final   Acinetobacter baumannii NOT DETECTED NOT DETECTED Final   Enterobacteriaceae species NOT DETECTED NOT DETECTED Final   Enterobacter cloacae complex NOT DETECTED NOT DETECTED Final   Escherichia coli NOT DETECTED NOT DETECTED Final   Klebsiella oxytoca NOT DETECTED NOT DETECTED Final   Klebsiella pneumoniae NOT DETECTED NOT DETECTED Final   Proteus species NOT DETECTED NOT DETECTED Final   Serratia marcescens NOT DETECTED NOT DETECTED Final   Haemophilus influenzae NOT DETECTED NOT DETECTED Final   Neisseria meningitidis NOT DETECTED NOT DETECTED Final   Pseudomonas aeruginosa NOT DETECTED NOT DETECTED Final   Candida albicans NOT DETECTED NOT DETECTED Final   Candida glabrata NOT DETECTED NOT DETECTED Final   Candida krusei NOT DETECTED NOT DETECTED Final   Candida parapsilosis NOT DETECTED NOT DETECTED Final   Candida tropicalis NOT DETECTED NOT DETECTED Final    Comment: Performed at [facility], [address] [place], [place] [address]  Respiratory Panel by PCR     Status: None   Collection Time: 12/01/2019 10:07 PM   Specimen: Nasopharyngeal Swab; Respiratory  Result Value Ref Range Status   Adenovirus NOT DETECTED NOT DETECTED Final   Coronavirus 229E NOT DETECTED NOT DETECTED Final    Comment: (NOTE) The Coronavirus on the Respiratory Panel, DOES NOT test for the novel  Coronavirus (2019 nCoV)    Coronavirus HKU1 NOT DETECTED NOT DETECTED Final   Coronavirus NL63 NOT DETECTED NOT DETECTED Final   Coronavirus OC43 NOT DETECTED NOT DETECTED Final   Metapneumovirus NOT DETECTED NOT DETECTED Final   Rhinovirus / Enterovirus NOT DETECTED NOT DETECTED Final   Influenza A NOT DETECTED NOT DETECTED Final   Influenza B NOT DETECTED NOT DETECTED Final   Parainfluenza Virus 1 NOT DETECTED NOT DETECTED Final   Parainfluenza Virus 2 NOT DETECTED NOT DETECTED Final   Parainfluenza Virus 3 NOT DETECTED NOT DETECTED Final   Parainfluenza Virus 4 NOT DETECTED  NOT DETECTED Final   Respiratory Syncytial Virus NOT DETECTED NOT DETECTED Final   Bordetella pertussis NOT DETECTED NOT DETECTED Final   Chlamydophila pneumoniae NOT DETECTED NOT DETECTED Final   Mycoplasma pneumoniae NOT DETECTED NOT DETECTED Final    Comment: Performed at [facility], [place] [place], [place] [address]  Blood culture (routine x 2)     Status: None (Preliminary result)   Collection Time: 12/21/19 12:24 AM   Specimen: BLOOD  Result Value Ref Range Status   Specimen Description BLOOD RIGHT HAND  Final   Special Requests  Final    BOTTLES DRAWN AEROBIC ONLY Blood Culture results may not be optimal due to an inadequate volume of blood received in culture bottles   Culture  Setup Time   Final    AEROBIC BOTTLE ONLY GRAM POSITIVE COCCI CRITICAL VALUE NOTED.  VALUE IS CONSISTENT WITH PREVIOUSLY REPORTED AND CALLED VALUE.    Culture   Final    GRAM POSITIVE COCCI IDENTIFICATION TO FOLLOW Performed at [facility], [place] [address], [place], [place] [address]    Report Status PENDING  Incomplete  Culture, blood (routine x 2)     Status: None (Preliminary result)   Collection Time: 12/22/19  7:32 AM   Specimen: BLOOD RIGHT HAND  Result Value Ref Range Status   Specimen Description BLOOD RIGHT HAND  Final   Special Requests   Final    BOTTLES DRAWN AEROBIC ONLY Blood Culture results may not be optimal due to an inadequate volume of blood received in culture bottles   Culture   Final    NO GROWTH 2 DAYS Performed at [facility], [place] [address], [place], [place] [address]    Report Status PENDING  Incomplete  Culture, blood (routine x 2)     Status: None (Preliminary result)   Collection Time: 12/22/19  7:50 AM   Specimen: BLOOD LEFT HAND  Result Value Ref Range Status   Specimen Description BLOOD LEFT HAND  Final   Special Requests   Final    BOTTLES DRAWN AEROBIC ONLY Blood Culture results may not be optimal due to an inadequate volume of  blood received in culture bottles   Culture   Final    NO GROWTH 2 DAYS Performed at [facility], [place] [address], [place], [place] [address]    Report Status PENDING  Incomplete    Coagulation Studies: No results for input(s): LABPROT, INR in the last 72 hours.  Urinalysis: No results for input(s): COLORURINE, LABSPEC, PHURINE, GLUCOSEU, HGBUR, BILIRUBINUR, KETONESUR, PROTEINUR, UROBILINOGEN, NITRITE, LEUKOCYTESUR in the last 72 hours.  Invalid input(s): APPERANCEUR    Imaging: CT HEAD WO CONTRAST  Result Date: 12/23/2019 CLINICAL DATA:  Fall earlier this month, now with tremors EXAM: CT HEAD WITHOUT CONTRAST TECHNIQUE: Contiguous axial images were obtained from the base of the skull through the vertex without intravenous contrast. COMPARISON:  CT head 10/10/2019 FINDINGS: Brain: There is a new punctate focus of calcification along the cortex of the anterior right insula, not present on comparison imaging is recent is 12/10/2019. The presence of this finding is worrisome for a possible distal branch calcified thrombus. However, there is no CT evidence of acute infarction. No acute intracranial hemorrhage, hydrocephalus, extra-axial collection or mass lesion/mass effect is seen either. Symmetric prominence of the ventricles, cisterns and sulci compatible with parenchymal volume loss. Patchy areas of white matter hypoattenuation are most compatible with chronic microvascular angiopathy. Vascular: Punctate calcification along the right anterior insular, possibly within the vascular branch. No other hyperdense vessels. Atherosclerotic calcification of the carotid siphons and intradural vertebral arteries. Skull: No calvarial fracture or suspicious osseous lesion. No scalp swelling or hematoma. Sinuses/Orbits: A apical and paranasal sinuses and mastoid air cells are predominantly clear. Orbital structures are unremarkable aside from prior lens extractions. Other: None IMPRESSION: New punctate  calcification along the cortex of the right insula, new since comparison. Appearance is nonspecific, could feasibly reflect some calcified intravascular thrombus versus dural calcification developing in the interim. No acute CT evidence of infarct is seen. If  there is persisting concern for ischemia, MRI is more sensitive and specific. These results will be called to the ordering clinician or representative by the Radiologist Assistant, and communication documented in the PACS or zVision Dashboard. Electronically Signed   By: Lovena Le M.D.   On: 12/23/2019 04:11   IR Fluoro Guide CV Line Right  Result Date: 01/07/2020 CLINICAL DATA:  Renal failure, needs access for hemodialysis EXAM: EXAM RIGHT FEMORAL HEMODIALYSIS CATHETER PLACEMENT UNDER ULTRASOUND AND FLUOROSCOPIC GUIDANCE TECHNIQUE: The procedure, risks (including but not limited to bleeding, infection, organ damage, pneumothorax), benefits, and alternatives were explained to the family. Questions regarding the procedure were encouraged and answered. The family understands and consents to the procedure. On ultrasound, right IJ vein appears occluded. A patent right external jugular was identified. An appropriate skin site was determined. Skin site was marked. Region was prepped using maximum barrier technique including cap and mask, sterile gown, sterile gloves, large sterile sheet, and Chlorhexidine as cutaneous antisepsis. The region was infiltrated locally with 1% lidocaine. Under real-time ultrasound guidance, the right EJ vein was accessed with a 21 gauge needle; the needle tip within the vein was confirmed with ultrasound image documentation. A 018 guidewire would not advance centrally. Limited venogram shows central venous occlusion. On review of prior venography, chronic occlusion of the left upper extremity central venous system was also documented. Therefore, femoral approach was used. Patency of the right saphenofemoral junction and common  femoral vein was documented. Under real-time ultrasound guidance, the right great saphenous vein just peripheral to the saphenofemoral junction was accessed with a 21 gauge micropuncture needle. 018 wire advanced centrally easily. The needle exchanged over guidewire for a transitional dilator. On fluoroscopy, atypical course of the guidewire was noted at the level of the iliac confluence. Limited venography demonstrated a narrowed but patent IVC with significant retroperitoneal collateral channels. An angled stiff glidewire was negotiated of the IVC to the right atrium. Tract was dilated to facilitate advancement of a 24 cm Mahurkar catheter. This was positioned with the tip in the IVC. Spot chest radiograph shows good positioning . Catheter was flushed and sutured externally with 0-Prolene sutures. Patient tolerated the procedure well. FLUOROSCOPY TIME:  2.9 minute; 0000000 uGym2 DAP COMPLICATIONS: COMPLICATIONS none IMPRESSION: 1. Technically successful right femoral Mahurkar catheter placement. 2. 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 Electronically Signed   By: Lucrezia Europe M.D.   On: 2020-01-07 08:22   IR US Guide Vasc Access Right  Result Date: 2020-01-07 CLINICAL DATA:  Renal failure, needs access for hemodialysis EXAM: EXAM RIGHT FEMORAL HEMODIALYSIS CATHETER PLACEMENT UNDER ULTRASOUND AND FLUOROSCOPIC GUIDANCE TECHNIQUE: The procedure, risks (including but not limited to bleeding, infection, organ damage, pneumothorax), benefits, and alternatives were explained to the family. Questions regarding the procedure were encouraged and answered. The family understands and consents to the procedure. On ultrasound, right IJ vein appears occluded. A patent right external jugular was identified. An appropriate skin site was determined. Skin site was marked. Region was prepped using maximum barrier technique including cap and mask, sterile gown, sterile gloves, large sterile sheet, and  Chlorhexidine as cutaneous antisepsis. The region was infiltrated locally with 1% lidocaine. Under real-time ultrasound guidance, the right EJ vein was accessed with a 21 gauge needle; the needle tip within the vein was confirmed with ultrasound image documentation. A 018 guidewire would not advance centrally. Limited venogram shows central venous occlusion. On review of prior venography, chronic occlusion of the left upper extremity central venous system was also documented.  Therefore, femoral approach was used. Patency of the right saphenofemoral junction and common femoral vein was documented. Under real-time ultrasound guidance, the right great saphenous vein just peripheral to the saphenofemoral junction was accessed with a 21 gauge micropuncture needle. 018 wire advanced centrally easily. The needle exchanged over guidewire for a transitional dilator. On fluoroscopy, atypical course of the guidewire was noted at the level of the iliac confluence. Limited venography demonstrated a narrowed but patent IVC with significant retroperitoneal collateral channels. An angled stiff glidewire was negotiated of the IVC to the right atrium. Tract was dilated to facilitate advancement of a 24 cm Mahurkar catheter. This was positioned with the tip in the IVC. Spot chest radiograph shows good positioning . Catheter was flushed and sutured externally with 0-Prolene sutures. Patient tolerated the procedure well. FLUOROSCOPY TIME:  2.9 minute; 0000000 uGym2 DAP COMPLICATIONS: COMPLICATIONS none IMPRESSION: 1. Technically successful right femoral Mahurkar catheter placement. 2. 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 Electronically Signed   By: Lucrezia Europe M.D.   On: 01/19/2020 08:22   ECHOCARDIOGRAM COMPLETE  Result Date: 12/22/2019   ECHOCARDIOGRAM REPORT   Patient Name:   WILHELMINA [name] Date of Exam: 12/22/2019 Medical Rec #:  [id number]      Height:       58.0 in Accession #:    [id number]     Weight:        122.1 lb Date of Birth:  1934/03/10      BSA:          1.48 m Patient Age:    32 years       BP:           117/60 mmHg Patient Gender: F              HR:           118 bpm. Exam Location:  Inpatient Procedure: 2D Echo Indications:     Bacteremia 790.7 / R78.81  History:         Patient has prior history of Echocardiogram examinations, most                  recent 02/27/2018. Mitral Valve Disease,                  Arrythmias:Palpitations, Signs/Symptoms:Chest Pain and Dyspnea;                  Risk Factors:Diabetes and Hypertension. ESRD (end stage renal                  disease)                  Sepsis.  Sonographer:     Vikki Ports Turrentine Referring Phys:  [id number] [place] CHOI Diagnosing Phys: Sanda Klein MD IMPRESSIONS  1. Left ventricular ejection fraction, by visual estimation, is 70 to 75%. The left ventricle has hyperdynamic function. There is no left ventricular hypertrophy.  2. The left ventricle has no regional wall motion abnormalities.  3. Global right ventricle has mildly reduced systolic function.The right ventricular size is normal. No increase in right ventricular wall thickness.  4. Left atrial size was mildly dilated.  5. Right atrial size was normal.  6. Large vegetation on the mitral valve.  7. Moderate calcification of the mitral valve leaflet(s).  8. Moderate mitral annular calcification.  9. Moderate thickening of the mitral valve leaflet(s). 10. The mitral valve is degenerative. Moderate to severe mitral valve regurgitation. 11. The tricuspid  valve is grossly normal. Tricuspid valve regurgitation moderate-severe. 12. The aortic valve is tricuspid. Aortic valve regurgitation is trivial. Mild aortic valve stenosis. 13. The pulmonic valve was grossly normal. Pulmonic valve regurgitation is not visualized. 14. Normal pulmonary artery systolic pressure. 15. Compared to 02/27/2018 there is a new mass attached to the posterior mitral leaflet a(pobably a vegetation given the clinical scenario) and  there is new mitral insufficiency, at least moderate in severity. 16. Prior images reviewed side by side. FINDINGS  Left Ventricle: Left ventricular ejection fraction, by visual estimation, is 70 to 75%. The left ventricle has hyperdynamic function. The left ventricle has no regional wall motion abnormalities. The left ventricular internal cavity size was the left ventricle is normal in size. There is no left ventricular hypertrophy. Right Ventricle: The right ventricular size is normal. No increase in right ventricular wall thickness. Global RV systolic function is has mildly reduced systolic function. The tricuspid regurgitant velocity is 2.02 m/s, and with an assumed right atrial pressure of 8 mmHg, the estimated right ventricular systolic pressure is normal at 24.3 mmHg. Left Atrium: Left atrial size was mildly dilated. Right Atrium: Right atrial size was normal in size Pericardium: There is no evidence of pericardial effusion. Mitral Valve: The mitral valve is degenerative in appearance. There is moderate thickening of the mitral valve leaflet(s). There is moderate calcification of the mitral valve leaflet(s). Moderate mitral annular calcification. A large vegetation is seen on the posterior mitral leaflet. The MV vegetation measures 12 mm x 10 mm. Moderate to severe mitral valve regurgitation. MV peak gradient, 16.8 mmHg. Tricuspid Valve: The tricuspid valve is grossly normal. Tricuspid valve regurgitation moderate-severe. Aortic Valve: The aortic valve is tricuspid. . There is moderate thickening and moderate calcification of the aortic valve. Aortic valve regurgitation is trivial. Mild aortic stenosis is present. There is moderate thickening of the aortic valve. There is  moderate calcification of the aortic valve. Aortic valve mean gradient measures 7.0 mmHg. Aortic valve peak gradient measures 12.5 mmHg. Aortic valve area, by VTI measures 1.51 cm. Pulmonic Valve: The pulmonic valve was grossly normal.  Pulmonic valve regurgitation is not visualized. Pulmonic regurgitation is not visualized. Aorta: The aortic root is normal in size and structure. IAS/Shunts: No atrial level shunt detected by color flow Doppler.  LEFT VENTRICLE PLAX 2D LVIDd:         3.50 cm  Diastology LVIDs:         1.80 cm  LV e' lateral: 5.76 cm/s LV PW:         1.00 cm LV IVS:        1.00 cm LVOT diam:     1.94 cm LV SV:         41 ml LV SV Index:   27.05 LVOT Area:     2.96 cm  RIGHT VENTRICLE RV S prime:     10.90 cm/s TAPSE (M-mode): 0.7 cm LEFT ATRIUM             Index       RIGHT ATRIUM           Index LA diam:        4.10 cm 2.78 cm/m  RA Area:     10.90 cm LA Vol (A2C):   48.5 ml 32.84 ml/m RA Volume:   21.80 ml  14.76 ml/m LA Vol (A4C):   51.9 ml 35.14 ml/m LA Biplane Vol: 50.3 ml 34.06 ml/m  AORTIC VALVE AV Area (Vmax):    1.41  cm AV Area (Vmean):   1.27 cm AV Area (VTI):     1.51 cm AV Vmax:           177.00 cm/s AV Vmean:          124.000 cm/s AV VTI:            0.235 m AV Peak Grad:      12.5 mmHg AV Mean Grad:      7.0 mmHg LVOT Vmax:         84.50 cm/s LVOT Vmean:        53.300 cm/s LVOT VTI:          0.120 m LVOT/AV VTI ratio: 0.51  AORTA Ao Root diam: 2.00 cm MITRAL VALVE             TRICUSPID VALVE MV Peak grad: 16.8 mmHg  TR Peak grad:   16.3 mmHg MV Mean grad: 8.0 mmHg   TR Vmax:        202.00 cm/s MV Vmax:      2.05 m/s MV Vmean:     129.0 cm/s SHUNTS MV VTI:       0.29 m     Systemic VTI:  0.12 m                          Systemic Diam: 1.94 cm  Dani Gobble Croitoru MD Electronically signed by Sanda Klein MD Signature Date/Time: 12/22/2019/4:08:00 PM    Final (Updated)      Medications:   . sodium chloride Stopped (12/22/19 2107)  . sodium chloride    . sodium chloride    . vancomycin     . aspirin  81 mg Oral Daily  . Chlorhexidine Gluconate Cloth  6 each Topical Q0600  . cloNIDine  0.1 mg Oral Daily  . darbepoetin (ARANESP) injection - DIALYSIS  150 mcg Intravenous Q Wed-HD  . doxercalciferol  1 mcg  Intravenous Q M,W,F-HD  . feeding supplement (NEPRO CARB STEADY)  237 mL Oral BID BM  . heparin  5,000 Units Subcutaneous Q8H  . levothyroxine  25 mcg Oral Q0600  . multivitamin  1 tablet Oral QHS  . sevelamer carbonate  800 mg Oral TID WC   sodium chloride, sodium chloride, sodium chloride, acetaminophen, alteplase, fentaNYL (SUBLIMAZE) injection, heparin, heparin, lidocaine (PF), lidocaine, lidocaine-prilocaine, pantoprazole, pentafluoroprop-tetrafluoroeth  Assessment/ Plan:  1. Sepsis/staph lugdunensis/gram variable rod/mitral valve endocarditis   TDC removed 12/21/2019 on MAxipime and Vanc.  Temporary dialysis catheter placed 12/23/2019 patient underwent hemodialysis 12/23/2019.  No fluid was removed due to hypotension 2. ESRD- MWF -last HD 12/18 - TDC out 12/21 -patient with temporary dialysis catheter placed 12/23/2019.  We will plan next dialysis treatment 12/26/2019 due to holiday schedule 3. Hypertension/volume-clonidine has been resumed we will continue to follow 4. Anemia- due for redose today - no Fe hgb 10.3 5. Metabolic bone disease- Continue Hectorol/binders - parsabiv not available here 6. Nutrition- renal dietvit/nepro alb 1.9 7. L3 compression fx - symptom management 8. Hypothyroidism-last TSH within normal range 10/13/2019 9. DNR  10. Tremulousness-head CT showed new punctate calcification along the cortex of the right insula.   LOS: [place] @TODAY @9 :49 AM

## 2020-01-01 NOTE — Death Summary Note (Signed)
Death Summary  [name] [id number] DOB: Sep 11, 1934 DOA: 01-12-20  PCP: Dorothyann Peng, NP  Admit date: 2020-01-12 Date of Death: Jan 16, 2020  Final Diagnoses:  Principal Problem:   Endocarditis of mitral valve Active Problems:   ESRD needing dialysis ([place])   Sepsis ([place])   Hypotension   Increased anion gap metabolic acidosis   Acute kidney injury superimposed on CKD ([place])   Anemia due to end stage renal disease ([place])   Hyponatremia   Closed L3 vertebral fracture ([place])   Hemodialysis catheter infection ([place])   Bacteremia due to coagulase-negative Staphylococcus   1. Sepsis secondary to coag negative staph bacteremia with endocarditis and septic emboli 2. Acute hypoxic respiratory failure 3. Acute metabolic encephalopathy  Hospital Course:  Patient admitted as above with severe sepsis, noted to have endocarditis without approach for treatment and likely showering septic emboli - suspected source was HD access given history. Patient continued to decompensate despite maximum medical therapy. Mental status continued to worsen over the past 72 hours with inability to tolerate PO diet, dialysis due to hypotension unable to tolerate IV fluids. Patient ultimately passed at 17:11 in the setting of above. Family at bedside.   Time: 17:11  Signed:  Little Ishikawa DO [facility] January 16, 2020, 5:20 PM

## 2020-01-01 NOTE — Progress Notes (Signed)
SLP Cancellation Note  Patient Details Name: [name] MRN: [id number] DOB: [birth year]   Cancelled treatment:       Reason Eval/Treat Not Completed: Patient's level of consciousness. Still too lethargic for assessment   Amberia Bayless, Katherene Ponto 01-16-2020, 2:27 PM

## 2020-01-01 NOTE — TOC Initial Note (Signed)
Transition of Care [facility]) - Initial/Assessment Note    Patient Details  Name: [name] MRN: [id number] Date of Birth: 1934-11-17  Transition of [facility]) CM/SW Contact:    Alexander Mt, [place] Phone Number: 01-06-2020, 2:03 PM  Clinical Narrative:                 CSW spoke with pt daughter [name], introduced self, role, reason for call. Pt from home where her niece has been staying with her. Pt generally was ambulating with a walker. Her family assists with transportation and daily cares as needed. Pt has a walker and bedside commode. She does not currently have any HH services, but pt daughter states at one time she had aide services (sounds like PCS) for 2 hours a day. Pt family had reapplied for these services in April but were told by DSS that it could be 7+ months until it was processed. Pt daughter is not interested in SNF at this time if pt could come home then she would prefer that.   TOC team continuing to follow.   Expected Discharge Plan: [place] Barriers to Discharge: Continued Medical Work up   Patient Goals and CMS Choice Patient states their goals for this hospitalization and ongoing recovery are:: for her to come home CMS Medicare.gov Compare Post Acute Care list provided to:: (n/a at this time) Choice offered to / list presented to : Adult Children  Expected Discharge Plan and Services Expected Discharge Plan: [place] In-house Referral: Clinical Social Work Discharge Planning Services: CM Consult Post Acute Care Choice: Home Health, Durable Medical Equipment Living arrangements for the past 2 months: [place]  Prior Living Arrangements/Services Living arrangements for the past 2 months: Single Family Home Lives with:: Relatives Patient language and need for interpreter reviewed:: Yes(no needs) Do you feel safe going back to the place where you live?: Yes      Need for Family Participation in Patient Care:  Yes (Comment)(assistance with ADL/IADLs) Care giver support system in place?: Yes (comment)(pt daughter; niece; relatives) Current home services: DME Criminal Activity/Legal Involvement Pertinent to Current Situation/Hospitalization: No - Comment as needed  Activities of Daily Living Home Assistive Devices/Equipment: Environmental consultant (specify type) ADL Screening (condition at time of admission) Patient's cognitive ability adequate to safely complete daily activities?: No Is the patient deaf or have difficulty hearing?: No Does the patient have difficulty seeing, even when wearing glasses/contacts?: No Does the patient have difficulty concentrating, remembering, or making decisions?: No Patient able to express need for assistance with ADLs?: No Does the patient have difficulty dressing or bathing?: No Independently performs ADLs?: No Does the patient have difficulty walking or climbing stairs?: Yes Weakness of Legs: Both Weakness of Arms/Hands: Both  Permission Sought/Granted Permission sought to share information with : Family Supports Permission granted to share information with : No(no- pt lethargic with fluctuating mental status)  Share Information with NAME: Glori Luis     Permission granted to share info w Relationship: daughter  Permission granted to share info w Contact Information: [phone]  Emotional Assessment Appearance:: Other (Comment Required(telelphonic assessment with daughter) Attitude/Demeanor/Rapport: (telephonic assessment with daughter) Affect (typically observed): (telephonic assessment with daughter) Orientation: : Oriented to Self, Fluctuating Orientation (Suspected and/or reported Sundowners) Alcohol / Substance Use: Not Applicable Psych Involvement: No (comment)  Admission diagnosis:  Weakness [R53.1] Fall, initial encounter [W19.XXXA] Sepsis [facility]) [A41.9] Patient Active Problem List   Diagnosis Date Noted  . Endocarditis of mitral  valve 12/22/2019  .  Hemodialysis catheter infection ([place])   . Bacteremia due to coagulase-negative Staphylococcus   . Hypotension 12/21/2019  . Increased anion gap metabolic acidosis [id number]  . Acute kidney injury superimposed on CKD ([place]) 12/21/2019  . Anemia due to end stage renal disease ([place]) 12/21/2019  . Hyponatremia 12/21/2019  . Closed L3 vertebral fracture ([place]) 12/21/2019  . Sepsis ([place]) 12/30/2019  . Iliopsoas muscle hematoma 04/10/2017  . Peripheral arterial disease ([place]) 02/14/2016  . DOE (dyspnea on exertion) 11/10/2015  . Unresponsive episode 06/10/2013  . ESRD needing dialysis ([place]) 06/09/2013  . Arteriosclerosis, mesenteric artery ([place]) 03/08/2013  . Nonocclusive mesenteric ischemia ([place]) 03/07/2013  . S/P lumbar spine operation 11/25/2012  . Chest pain 04/24/2012  . Tachycardia 04/16/2012  . Atherosclerosis of native arteries of the extremities with gangrene ([place]) 04/10/2012  . Mitral valve disorders(424.0) 03/28/2010  . PALPITATIONS 02/14/2010  . Type 2 diabetes mellitus with diabetic chronic kidney disease ([place]) 02/13/2010  . GOUT 02/13/2010  . HYPERKALEMIA 02/13/2010  . HTN (hypertension) 02/13/2010  . COPD 02/13/2010  . RENAL FAILURE, END STAGE 02/13/2010  . WALKING DIFFICULTY 02/13/2010   PCP:  Dorothyann Peng, NP Pharmacy:   [id number] [address] [place] [address] Phone: [phone] Fax: [phone]    Readmission Risk Interventions Readmission Risk Prevention Plan 01/23/20  Transportation Screening Complete  PCP or Specialist Appt within 3-5 Days Not Complete  Not Complete comments disposition pending  [place] or Home Care Consult Complete  Social Work Consult for [place] Planning/Counseling Complete  Palliative Care Screening Not Complete  Medication Review Press photographer) Referral to Pharmacy  Some recent data might be hidden

## 2020-01-01 DEATH — deceased

## 2020-05-03 IMAGING — CT CT HEAD W/O CM
4 series · 17 of 47 positions shown, 19 images · non-contrast
Comparison: 05/10/2015

CLINICAL DATA: Fall 1 week ago with left frontal parietal hematoma
and headache.

EXAM:
CT HEAD WITHOUT CONTRAST
TECHNIQUE: Contiguous axial images were obtained from the base of the skull
through the vertex without intravenous contrast.

[Series 3: head without · axial · non-contrast · 0.37mm/px · z∈[-86,+24]mm · 7 of 30 slices shown, 9 images]
[im 4/30  brain]
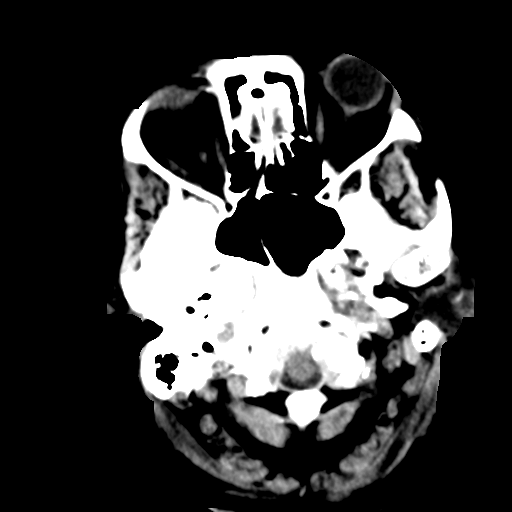
[im 4/30  bone]
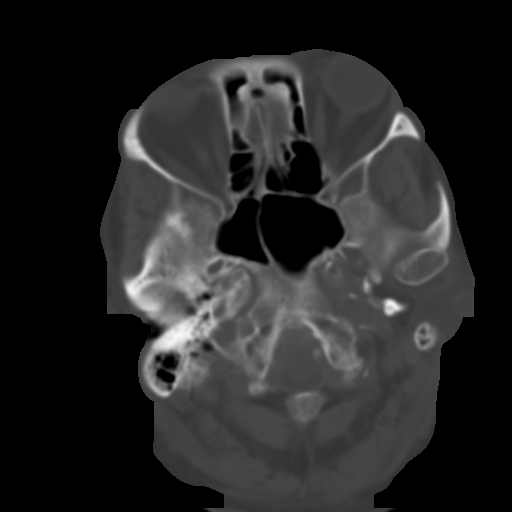
[im 8/30  brain]
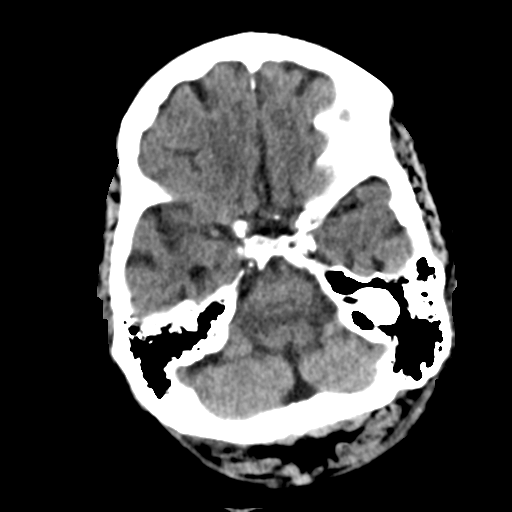
[im 11/30  brain]
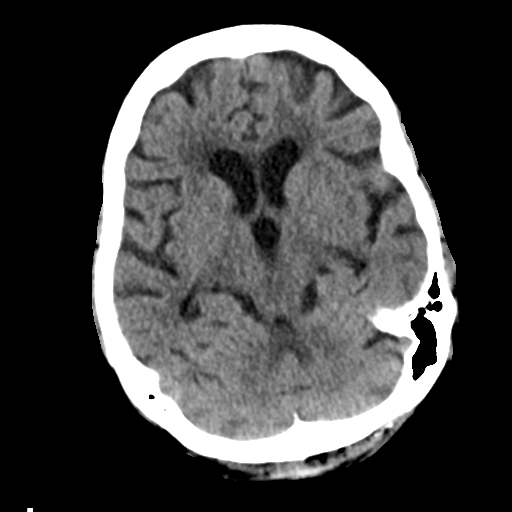
[im 15/30  brain]
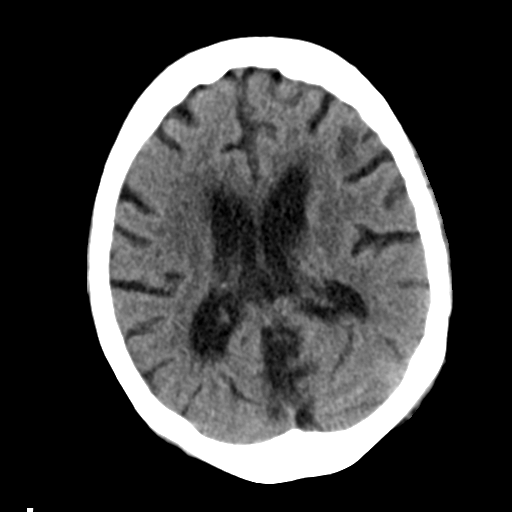
[im 19/30  brain]
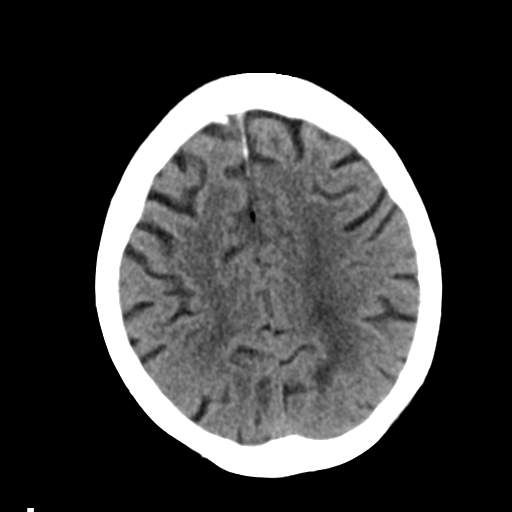
[im 19/30  bone]
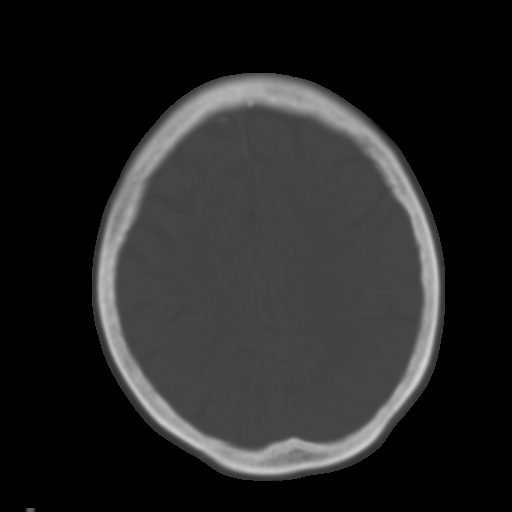
[im 22/30  brain]
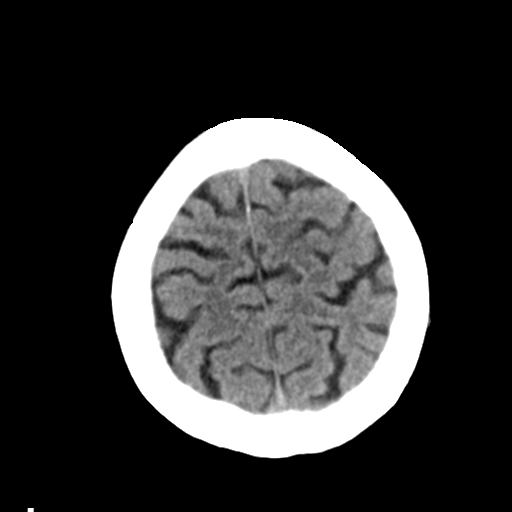
[im 26/30  brain]
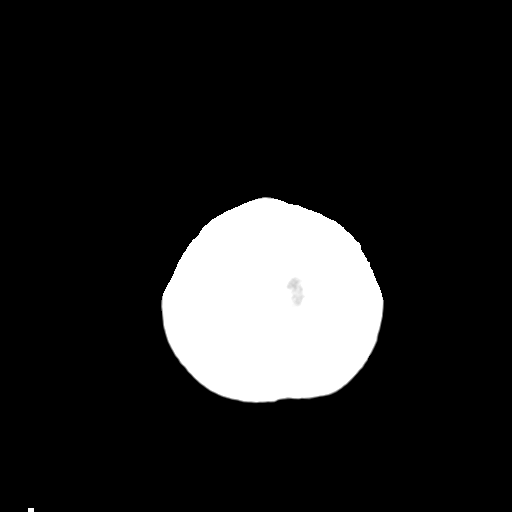

[Series 4: head bone · axial · 0.37mm/px · z∈[-87,-35]mm · 4 of 75 slices shown]
[im 8/75  bone]
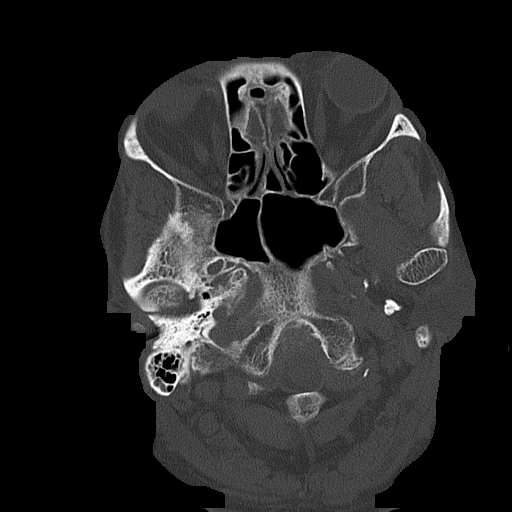
[im 15/75  bone]
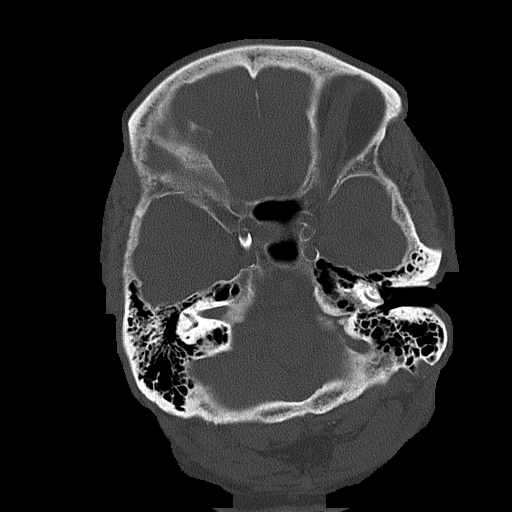
[im 23/75  bone]
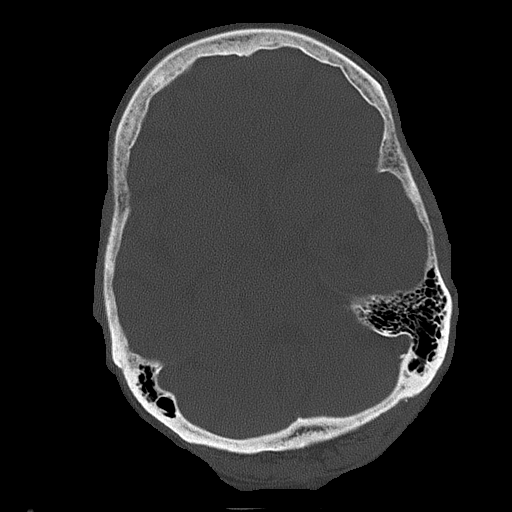
[im 34/75  bone]
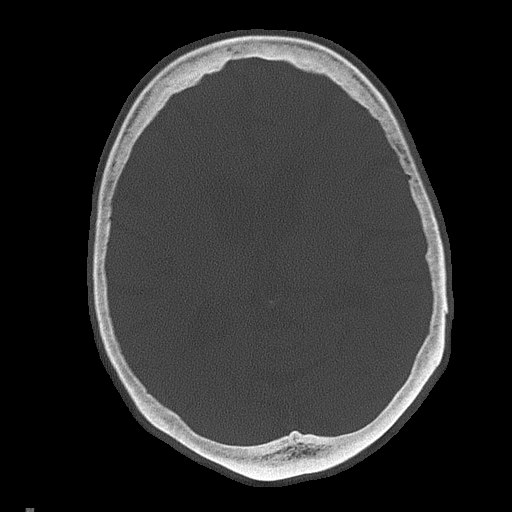

[Series 5: head without cor · coronal · non-contrast · 0.29mm/px · 3 of 65 slices shown]
[im 22/65  brain]
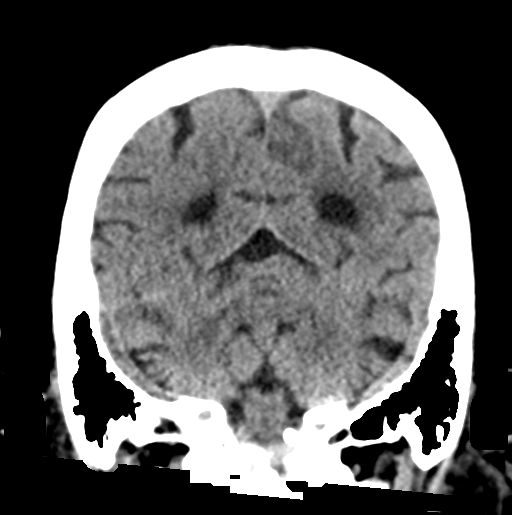
[im 29/65  brain]
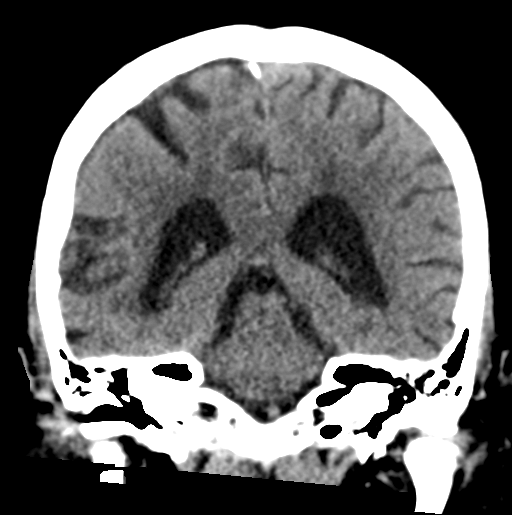
[im 36/65  brain]
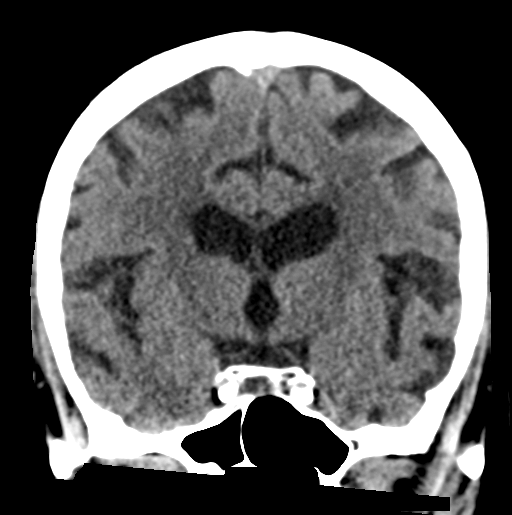

[Series 6: head without sag · sagittal · non-contrast · 0.29mm/px · 3 of 53 slices shown]
[im 18/53  brain]
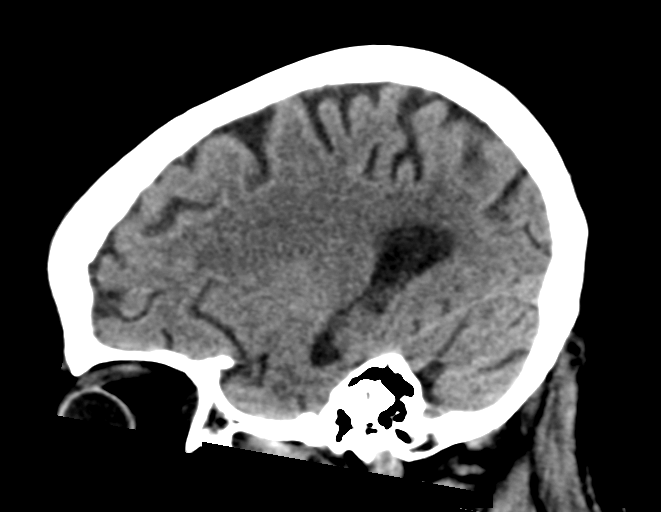
[im 27/53  brain]
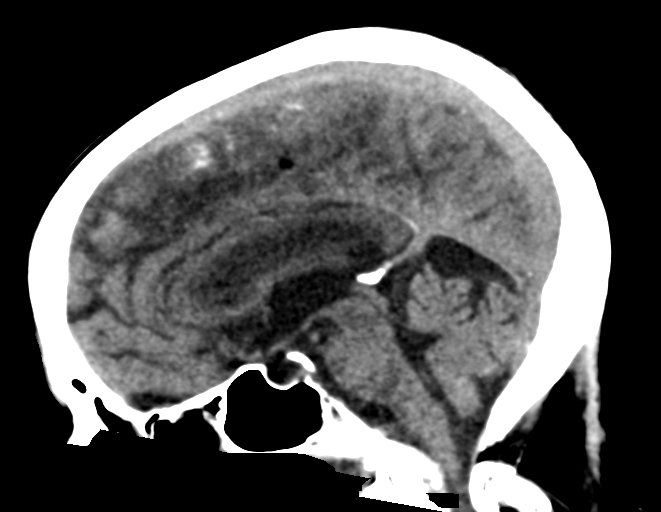
[im 35/53  brain]
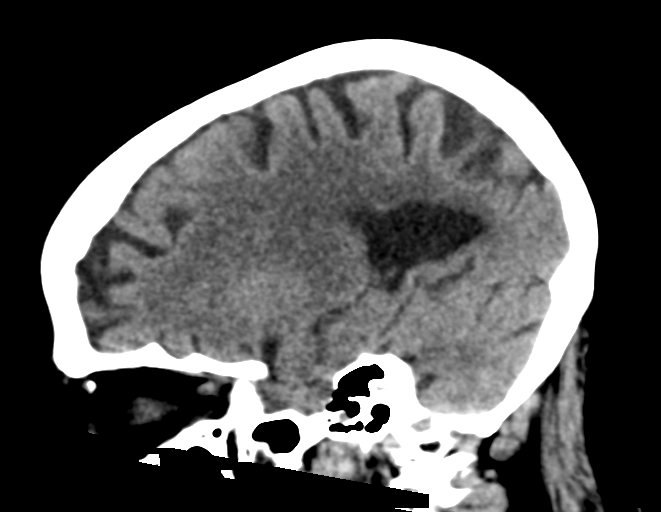

[17 of 47 positions shown; findings below may reference images not displayed]

FINDINGS: Brain: Examination demonstrates mild age related atrophic change and
chronic ischemic microvascular disease. Ventricles and cisterns are
otherwise unremarkable. There is no mass, mass effect, shift of
midline structures or acute hemorrhage. No evidence of acute
infarction.

Vascular: No hyperdense vessel or unexpected calcification.

Skull: Normal. Negative for fracture or focal lesion.

Sinuses/Orbits: Orbits are normal. Visualized paranasal sinuses are
clear.

Other: None.
IMPRESSION: 1.  No acute findings.

2. Chronic ischemic microvascular disease and age related atrophic
change.
